# Patient Record
Sex: Male | Born: 1975
Health system: Southern US, Community
[De-identification: ages and names within clinical notes are randomized; demographics above are authoritative.]

## PROBLEM LIST (undated history)

## (undated) VITALS — BP 120/77 | HR 116 | Temp 97.5°F | Resp 17 | Ht 70.0 in | Wt 227.0 lb

## (undated) DIAGNOSIS — F319 Bipolar disorder, unspecified: Secondary | ICD-10-CM

## (undated) DIAGNOSIS — F209 Schizophrenia, unspecified: Secondary | ICD-10-CM

## (undated) DIAGNOSIS — R12 Heartburn: Secondary | ICD-10-CM

## (undated) DIAGNOSIS — R569 Unspecified convulsions: Secondary | ICD-10-CM

## (undated) DIAGNOSIS — Z9119 Patient's noncompliance with other medical treatment and regimen: Secondary | ICD-10-CM

## (undated) DIAGNOSIS — F1020 Alcohol dependence, uncomplicated: Secondary | ICD-10-CM

## (undated) DIAGNOSIS — F200 Paranoid schizophrenia: Secondary | ICD-10-CM

## (undated) DIAGNOSIS — F1910 Other psychoactive substance abuse, uncomplicated: Secondary | ICD-10-CM

## (undated) DIAGNOSIS — Z91199 Patient's noncompliance with other medical treatment and regimen due to unspecified reason: Secondary | ICD-10-CM

## (undated) HISTORY — PX: NO PAST SURGERIES: SHX2092

---

## 2000-10-03 ENCOUNTER — Inpatient Hospital Stay (HOSPITAL_COMMUNITY): Admission: EM | Admit: 2000-10-03 | Discharge: 2000-10-07 | Payer: Self-pay | Admitting: *Deleted

## 2006-09-10 ENCOUNTER — Ambulatory Visit: Payer: Self-pay | Admitting: *Deleted

## 2006-09-10 ENCOUNTER — Inpatient Hospital Stay (HOSPITAL_COMMUNITY): Admission: AD | Admit: 2006-09-10 | Discharge: 2006-09-16 | Payer: Self-pay | Admitting: *Deleted

## 2006-10-23 ENCOUNTER — Emergency Department (HOSPITAL_COMMUNITY): Admission: EM | Admit: 2006-10-23 | Discharge: 2006-10-24 | Payer: Self-pay | Admitting: Emergency Medicine

## 2007-05-07 ENCOUNTER — Emergency Department (HOSPITAL_COMMUNITY): Admission: EM | Admit: 2007-05-07 | Discharge: 2007-05-07 | Payer: Self-pay | Admitting: Emergency Medicine

## 2009-12-10 ENCOUNTER — Other Ambulatory Visit: Payer: Self-pay | Admitting: Emergency Medicine

## 2009-12-10 ENCOUNTER — Ambulatory Visit: Payer: Self-pay | Admitting: Psychiatry

## 2009-12-10 ENCOUNTER — Inpatient Hospital Stay (HOSPITAL_COMMUNITY): Admission: AD | Admit: 2009-12-10 | Discharge: 2009-12-19 | Payer: Self-pay | Admitting: Psychiatry

## 2010-10-15 ENCOUNTER — Emergency Department (HOSPITAL_BASED_OUTPATIENT_CLINIC_OR_DEPARTMENT_OTHER)
Admission: EM | Admit: 2010-10-15 | Discharge: 2010-10-16 | Payer: Self-pay | Source: Home / Self Care | Admitting: Emergency Medicine

## 2011-01-08 LAB — BASIC METABOLIC PANEL
BUN: 8 mg/dL (ref 6–23)
CO2: 21 mEq/L (ref 19–32)
Chloride: 107 mEq/L (ref 96–112)
Creatinine, Ser: 0.7 mg/dL (ref 0.4–1.5)
Glucose, Bld: 102 mg/dL — ABNORMAL HIGH (ref 70–99)
Potassium: 4.4 mEq/L (ref 3.5–5.1)

## 2011-01-08 LAB — ETHANOL: Alcohol, Ethyl (B): <5 mg/dL (ref 0–10)

## 2011-01-08 LAB — POCT TOXICOLOGY PANEL

## 2011-01-08 LAB — VALPROIC ACID LEVEL: Valproic Acid Lvl: 75.8 ug/mL (ref 50.0–100.0)

## 2011-01-17 LAB — URINALYSIS, ROUTINE W REFLEX MICROSCOPIC
Bilirubin Urine: NEGATIVE
Hgb urine dipstick: NEGATIVE
Ketones, ur: NEGATIVE mg/dL
Nitrite: NEGATIVE
Specific Gravity, Urine: 1.02 (ref 1.005–1.030)
Urobilinogen, UA: 0.2 mg/dL (ref 0.0–1.0)

## 2011-01-17 LAB — TSH: TSH: 2.422 u[IU]/mL (ref 0.350–4.500)

## 2011-01-17 LAB — HEPATIC FUNCTION PANEL
Albumin: 4.1 g/dL (ref 3.5–5.2)
Alkaline Phosphatase: 43 U/L (ref 39–117)
Bilirubin, Direct: 0.1 mg/dL (ref 0.0–0.3)
Total Bilirubin: 0.5 mg/dL (ref 0.3–1.2)

## 2011-01-17 LAB — RPR: RPR Ser Ql: NONREACTIVE

## 2011-01-18 LAB — DIFFERENTIAL
Basophils Absolute: 0.1 10*3/uL (ref 0.0–0.1)
Basophils Relative: 2 % — ABNORMAL HIGH (ref 0–1)
Eosinophils Absolute: 0 10*3/uL (ref 0.0–0.7)
Monocytes Absolute: 0.7 10*3/uL (ref 0.1–1.0)
Monocytes Relative: 11 % (ref 3–12)

## 2011-01-18 LAB — BASIC METABOLIC PANEL
CO2: 28 mEq/L (ref 19–32)
Chloride: 103 mEq/L (ref 96–112)
Creatinine, Ser: 0.7 mg/dL (ref 0.4–1.5)
GFR calc Af Amer: >60 mL/min (ref >60)
Glucose, Bld: 133 mg/dL — ABNORMAL HIGH (ref 70–99)
Sodium: 139 mEq/L (ref 135–145)

## 2011-01-18 LAB — CBC
Hemoglobin: 15 g/dL (ref 13.0–17.0)
MCHC: 34.8 g/dL (ref 30.0–36.0)
MCV: 96.2 fL (ref 78.0–100.0)
RBC: 4.49 MIL/uL (ref 4.22–5.81)
RDW: 12 % (ref 11.5–15.5)

## 2011-01-18 LAB — POCT TOXICOLOGY PANEL

## 2011-03-16 NOTE — Discharge Summary (Signed)
Albert [name], Albert [name]        ACCOUNT NO.:  [id number]   MEDICAL RECORD NO.:  [id number]          PATIENT TYPE:  IPS   LOCATION:  0403                          FACILITY:  [facility]   PHYSICIAN:  Jasmine Pang, M.D. DATE OF BIRTH:  Jun 21, 1976   DATE OF ADMISSION:  09/10/2006  DATE OF DISCHARGE:  09/16/2006                               DISCHARGE SUMMARY   IDENTIFYING INFORMATION:  The patient is a 35 year old Caucasian male  who was single who was admitted on a voluntary basis on September 10, 2006.   HISTORY OF PRESENT ILLNESS:  The patient was referred by the mental  health center after expressing paranoid thoughts that he thought people  were praying for him to die at AA.  He reports having a lot of excess  energy and needing to walk a lot.  He felt his thoughts were getting  scattered and his memory was deteriorating.  He was having a lot of  difficulty focusing, for example had lost his wallet that day.  The  patient sees Willette Alma at [facility].  This is  the second [facility] admission.  He was last here in 2001.  He has had at  least one prior suicide attempt in 2001 where he cut himself.  He has  had prior stays at [facility] and [facility].  He  reports some troubles with his sexual identity.  He is also diagnosed  with general anxiety disorder.  The patient has been on Prozac, Paxil,  Risperdal and Depakote.   MEDICATIONS:  None now.   PHYSICAL EXAMINATION:  Well-developed, well-nourished Caucasian male in  no acute distress.   LABORATORY DATA:  Initial sodium was 129 (135-145).  Rest of  comprehensive metabolic panel was within normal limits.  CBC was within  normal limits.  TSH was 1.418 which was within normal limits.   HOSPITAL COURSE:  The patient was started on Risperdal M-Tab 0.5 mg p.o.  q.h.s. and 0.5 mg p.o. q.h.s. p.r.n. psychotic agitation.  He was also  started on Ativan 2 mg p.o. or IM q.6h. p.r.n. anxiety or  agitation.  He  did not need this.  On September 11, 2006, the patient was started on  Depakote ER p.o. q.h.s.  On September 12, 2006, at the patient's request,  the Risperdal was discontinued.  He stated he could not afford both of  these and felt he could do well on the Depakote alone.  The patient  tolerated his medications well with no significant side effects.  An  a.m. Depakote level was ordered and was 35.1 (50-100).  This was soon  after starting his Depakote .   On September 11, 2006, after first meeting the patient, he talked about  being here because he was panicky and anxious.  He was rubbing his hands  together.  He was quiet and reserved.  He was reading the new info  packet.  He stated I would love to be at the beach somewhere.  He was  somewhat incongruent with the conversation but he also spontaneously  stated he wanted to be married with  no trigger from me.  On September 12, 2006, the patient slept well.  He continued to be somewhat odd in his  relating with other people.  He had poor eye contact.  He discussed his  grandparents.  He also stated he did not want to stay on the Risperdal  but only the Depakote so Risperdal was discontinued.  On September 13, 2006, the patient discussed his apartment.  His appetite was good.  He  had begun to sleep through the night.  He wanted to know when he could  go home.  He continued be vague, somewhat evasive and incongruent with  an incongruent affect as we talked.  On the weekend, September 14, 2006  and September 15, 2006, the patient stated he was feeling better.  He had  come in for some negative thoughts and said these were much improved.  His Depakote level had returned and was 35.1 on September 13, 2006.   On September 16, 2006, the patient's mental status had improved from  admission status.  His affect was still somewhat incongruent and bizarre  with poor eye contact but this had improved.  His speech was normal rate  and flow.   Psychomotor activity was within normal limits.  The patient's  mood was less anxious and depressed.  Affect still somewhat bland and  flat but improved from admission status.  There was no suicidal or  homicidal ideation.  No thoughts of self-injurious behavior.  No  auditory or visual hallucinations.  No delusions or paranoia.  Thoughts  were logical and goal-directed.  Thought content no predominant theme.  Cognitive was grossly within normal limits.  The patient was felt to be  safe and ready for discharge today.   DISCHARGE DIAGNOSES:  AXIS I:  Bipolar, type 1, hypomanic.  Alcohol  abuse in seven month remission.  AXIS II:  None.  AXIS III:  Hyponatremia.  AXIS IV:  Moderate (problems with primary support group, other  psychosocial problems).  AXIS V:  GAF at discharge 45; GAF at admission 25; GAF highest past year  36.   ACTIVITY/DIET:  There were no specific activity level or dietary  restrictions.   DISCHARGE MEDICATIONS:  1. Depakote ER 500 mg p.o. q.h.s.  2. Ambien 10 mg at bedtime if needed for sleep.   POST-HOSPITAL CARE PLANS:  The patient will be seen at the [facility] on Tuesday September 25, 2006 at 3 p.m.  He will be seen by Dr.  Lang Snow for reentry plans.      Jasmine Pang, M.D.  Electronically Signed     BHS/MEDQ  D:  09/16/2006  T:  09/16/2006  Job:  [id number]

## 2011-03-16 NOTE — H&P (Signed)
Albert [name], Albert [name]        ACCOUNT NO.:  [id number]   MEDICAL RECORD NO.:  [id number]          PATIENT TYPE:  IPS   LOCATION:  0403                          FACILITY:  [facility]   PHYSICIAN:  Jasmine Pang, M.D. DATE OF BIRTH:  08-06-1976   DATE OF ADMISSION:  09/10/2006  DATE OF DISCHARGE:                         PSYCHIATRIC ADMISSION ASSESSMENT   IDENTIFICATION:  A 35 year old single white male.  This is a voluntary  admission.   HISTORY OF PRESENT ILLNESS:  This patient reports he walked to mental health  yesterday.  He has been feeling for the past 2 weeks that he has a lot of  excessive energy needing to move all the time, sleep has been rather poor  and he reports that his memory is deteriorating.  He lost his wallet  yesterday, feels unable to concentrate, developing a lot of scattered  thoughts and then feeling vaguely upset with some suicidal thoughts without  a clear plan.  At mental health he expressed some ideas that the people at  [facility] that he attends were praying for him to die.  His  affect was a bit bizarre yesterday so he was referred for inpatient  admission.  He denies any a homicidal thoughts today or any active suicidal,  thoughts but continues to have a lot of racing thought, difficulty  concentrating and feels unable to relax.   PAST PSYCHIATRIC HISTORY:  This is the second admission to [name] with his last admission being in 2001 when he was  seen by Dr. Carlye Grippe, and at that time was diagnosed with generalized  anxiety disorder and depression.  He was treated with Paxil at that time and  had been followed for quite some time by Willette Alma at [facility] in [place], [place].  He was also, in his last stay  here in 2001, struggling with some sexual identity issues.  He has a history  of one prior suicide attempt in 2001 when he attempted to cut himself.  He  has a history of  prior stays at [facility] and [facility].  He also has a history of some alcohol abuse and reports  that he has been abstaining from alcohol since February 16, 2006, has not  relapse and attends AA to reinforce his sobriety.  Also a history of  marijuana use which he currently denies.   SOCIAL HISTORY:  This is a single white male that lives in his own  apartment, no children, was raised Catholic, not practicing his faith at  this time, expressing the desire to pursue service work.  He receives  financial support from his mother, who lives in [place], [place].  He is the middle of three children and has a younger sister and an older  sister.  No evidence of legal charges.   FAMILY HISTORY:  The patient's family history is noncontributory.   ALCOHOL AND DRUG HISTORY:  Noted above.  The patient is a nonsmoker.  No use  of tobacco.   MEDICAL HISTORY:  The patient claims he has no primary  care Zaccheus Edmister  currently.  In the past, he has been seen at [facility].   MEDICAL PROBLEMS:  He denies.   PAST MEDICAL HISTORY:  No seizures.  No surgeries.  No hospitalizations  other than the psychiatric hospitalizations noted above.  No fractures.  No  history of myocardial infarct, no acute asthma, no diabetes.   MEDICATIONS:  Currently none.  In the past he has taken Prozac, Wellbutrin,  Risperdal, Depakote, also had taken Xanax and Ativan in various amounts for  his generalized anxiety disorder.   DRUG ALLERGIES:  NO KNOWN DRUG ALLERGIES.   POSITIVE PHYSICAL FINDINGS:  The patient is a well-nourished, well-developed  male who is in no distress.  The physical exam is entirely unremarkable on  admission to the unit.  Height 5 feet 10 inches tall, 173 pounds, pulse 94,  respirations 20.  He was afebrile, blood pressure 162/88.   DIAGNOSTIC STUDIES:  CBC within normal limits with hemoglobin 14.8,  hematocrit 42.5, platelets 274,000.  Sodium is  decreased at 129, potassium  normal at 3.9.  Chloride 97, carbon dioxide 27, BUN 9 and creatinine 0.9,  random glucose 89.  Liver enzymes are within normal limits.  SGOT 30, SGPT  16, alkaline phosphatase 44 and total bilirubin 0.9 calcium normal.  TSH  1.418.  UA and UDS is currently pending.   MENTAL STATUS EXAM:  Fully alert male, appropriate, polite, pleasant,  anxious affect somewhat moist skin erect posture, clenching his hands,  clinching and unclinching them, looking somewhat anxious but very polite.  Speech is normal in tone circumferential in pattern.  No response lag.  Production is adequate.  Mood is anxious.  Insight is poor.  He is  attempting to concentrate and answer questions but is losing track of his  train of thought.  Speech contents is rather superficial talking about  wanting to be a volunteer, do the right thing, a lot of automatic type  responses, admits to being anxious, feeling bad because he cannot remember  things.  He expresses some fear over his own racing thoughts, is having some  flight of ideas with decreased concentration, vague positive suicidal  thoughts.  No homicidal thought.  No paranoia expressed today.  Clearly had  some of that at mental health yesterday.  Cognitively, he is intact and  oriented x3.   AXIS I:  1. Rule out bipolar I disorder, hypomanic  2. Alcohol abuse in 7 months remission  AXIS III:  Hyponatremia.  AXIS IV:  Deferred.  Having a supportive mother and family is an asset to  him.  AXIS V:  Current 25, past year 53.   PLAN:  Voluntarily admit the patient with every 15-minute checks in place.  He is in our intensive care unit at this time.  We have discussed  medications to help control his mood and he is agreeable to restarting  Depakote, which we will do today and we are giving him Risperdal 0.5 mg on  regular dosing at h.s.  We are restricting his fluids, especially his free water and encourage him to drink Gatorade and we  will recheck a sodium level  tomorrow.  He is agreeable to having his mother involved in his care here  and will see about getting a family session.   ESTIMATED LENGTH OF STAY:  5-7 days.      Margaret A. Lorin Picket, N.P.      Jasmine Pang, M.D.  Electronically Signed    MAS/MEDQ  D:  09/11/2006  T:  09/11/2006  Job:  [id number]

## 2011-03-16 NOTE — H&P (Signed)
[facility]  Patient:    Albert [name], Albert [name]                       MRN: [id number] Adm. Date:  [id number] Attending:  Otilio Saber Dictator:   Johnella Moloney, N.P.                   Psychiatric Admission Assessment  IDENTIFYING INFORMATION:  Albert [name] is a 35 year old white single male admitted on an involuntary basis October 03, 2000.  CHIEF COMPLAINT:  "A little suicidal, anxiety attack, fear, and I just started cutting myself with a knife, depression."  HISTORY OF PRESENT ILLNESS:  The patient reports for the last week he has not worked and he has had multiple anxiety attacks.  He describes the anxiety attacks as "I just want to crawl out of skin and I have a sick feeling."  He has had some difficulties at work.  He feels like management and some people at work have been creating some problems for him.  He states, "I blew up and stood up for myself."  He also said apparently some coworkers are spreading rumors and gossip about him which makes it very difficult for him.  He also reports that he is beginning to isolate himself from everyone and when he starts doing this, this usually precedes the depression which has lead to hospitalizations and suicide attempts in the past.  He states he feels like he is anxious and the depression comes after the feelings of anxiety have intensified.  Lots of negative feelings about himself, low self-esteem. Apparently had a friend visit him yesterday who encouraged him to talk to Dr. Betti Cruz about getting treatment and possible hospitalization.  He started having suicidal thoughts on Tuesday although there was no plan.  He did, however, cut himself on his wrists superficially this past Tuesday and a friend stopped him. He states his mind is racing.  Sleep has been poor, averaging two to four hours for three months.  He states he feels like a lot of his sleep problems are attributed to his work schedule and moving from  shift to shift that interferes with his sleep pattern.  Appetite has been poor for about two months.  He has lost 10 pounds in one week.  Again, stress at work, low self-esteem.  He has really no support system.  He has been struggling with a sexual identity issue which he finally thinks he has accepted the fact regarding his sexual identity; however, he has not told anybody including family.  PAST PSYCHIATRIC HISTORY:  The patient has had one previous suicide attempt; in September 2000 he cut himself.  He has been seeing Dr. Betti Cruz for two months for depression and anxiety.  He saw Albert [name] at the [facility] for two years off and on.  He has been hospitalized x 2, June 2000 at [facility] when he was suicidal; September 2000 at [facility] for seven to eight days when he was suicidal.  PAST MEDICAL HISTORY:  The patient primary care doctor is Dr. Chauncy Lean at the [facility] in [facility].  He last saw him last week.  Medical problems: Allergies.  Current medications: Paxil 40 mg q.a.m., he has been on this dose for approximately one month; Xanax 1 mg b.i.d. p.r.n. p.o., he usually takes a Xanax at 1 to 2 oclock in the afternoon and sometimes one about 7 to 8  p.m. in the evening; Claritin 10 mg q.d.; eyedrops, i.e. normal saline solution.  Drug allergies: No known drug allergies.  SOCIAL HISTORY:  The patient is single.  He lives in [place] alone in an apartment.  He stays with a friend here in [place] about 50% of the time. His parents are living.  His mother lives in [place].  His father lives in [place] and he has not seen him in two years since he lives so far away.  He does have a twin sister with whom he has a close relationship.  He also has another younger sister.  Completed high school.  He works at [employer] in [place] and has been there for nine months.  He states he really would like to go back to  college and, again, there are some work-related problems.  Some financial problems.  His credit cards are maxxed out.  He does acknowledge he is involved in a significant relationship.  Again, conflicts about sexual identity and he was raised Catholic which also creates a lot of guilt for him. Family history: Mother suffers from depression, a lot of his aunts suffer from depression.  ALCOHOL AND DRUG HISTORY:  The patient drinks occasionally.  He states he might drink a six pack perhaps five times in the last two months.  It sounds like he actually drinks to treat his anxiety.  He states he drank a six pack this week and he felt like that might have triggered something in terms of his current problems.  He has a history of marijuana abuse.  He last used two years ago.  He is a nonsmoker.  PHYSICAL EXAMINATION:  Pending.  Temperature 97.4, respirations 18, pulse 62, blood pressure 112/70, weight 133 pounds, height 5 feet 11 inches.  CURRENT MENTAL STATUS EXAMINATION:  A young, casually dressed Caucasian male who is cooperative, good eye contact.  He is fidgeting a lot, rubbing his face constantly.  Speech: Normal tone and relevant.  Mood: Anxious, sad.  Affect: Labile.  Currently no suicidal but he was having suicidal thoughts yesterday and he did cut his wrists superficially this past Tuesday.  Thought processes are logical and coherent without evidence of psychosis.  No hallucinations, no delusions.  He does have a tendency to ramble; however, he can be easily redirected.  Cognitive: Alert and oriented, cognitive functioning is intact. Insight: Fair.  Impulse control: Fair.  Judgment: Poor.  CURRENT DIAGNOSES: Axis I:    1. Generalized anxiety disorder.            2. Major depression, recurrent. Axis II:   Deferred. Axis III:  Allergies. Axis IV:   Severe related to financial problems, sexual identity problems, and            work problems. Axis V:    Current global assessment of  functioning 37, highest in the past            year 65.  TREATMENT PLAN AND RECOMMENDATIONS:  Voluntary admission to [facility] Unit.  Maintain safety, check every 15 minutes.  The patient is able to contract for safety.  Will continue Paxil 40 mg p.o. q.d.  He has only been at the 40 mg level for one month; Xanax 1 mg t.i.d. p.o. p.r.n.; Claritin 10 mg p.o. q.d. for allergies; All Clear eye drops one to two drops q.i.d. p.r.n.  Have the patient attend groups.  Our goal will be to maintain his safety, medication adjustment, and probably  when he is released, refer him back to Dr. Betti Cruz, would recommend individual therapy for him and also a good support group for him.  TENTATIVE LENGTH OF STAY:  Three days. DD:  10/04/00 TD:  10/04/00 Job: [id number]

## 2011-03-16 NOTE — Discharge Summary (Signed)
[facility]  Patient:    Albert [name], Albert [name]                       MRN: [id number] Adm. Date:  [id number] Disc. Date: [id number] Attending:  Otilio Saber                           Discharge Summary  BRIEF HISTORY OF PRESENT ILLNESS:  Mr. Fillinger is a 35 year old single white male admitted under commitment with a history of depression and suicidal ideation.  The patient reported not working for the week prior to admission and having multiple anxiety attacks.  He had been irritable at work and felt that people were spreading rumors and gossip about him.  He became increasingly isolated and stated this had usually preceded his depression and previous suicide attempts.  He felt increasingly anxious and had suicidal thoughts and began cutting on his wrists superficially.  He reported some racing thoughts, decreased sleep, and decreased appetite with a 10 pound weight loss.  PAST PSYCHIATRIC HISTORY:  The patient had one previous suicide attempt in September 2000 when he cut himself.  He had been seeing Dr. Betti Cruz for two months for depression and anxiety.  He had seen Dr. Phillips Hay at the [facility] off and on over the past two years.  He was hospitalized in June 2000 at [facility] and September 2000 at [facility].  PAST MEDICAL HISTORY:  He was followed medically by Dr. Harriet Pho at the [facility] in [place].  The patient had no significant medical problems.  At the time of admission, he was on Paxil 40 mg q.a.m. and Xanax 1 mg b.i.d. p.r.n., Claritin 10 mg q.d.  He had no known drug allergies.  PHYSICAL EXAMINATION:  Normal.  MENTAL STATUS EXAMINATION ON ADMISSION:  Casually dressed white male.  He was fidgeting a lot, rubbing his face constantly.  Speech was normal.  Thought processes were logical and coherent without evidence of psychosis.  He was denying further suicidal ideation.  Mood  was depressed anxious.  Affect was rather labile.  Oriented x 3.  Cognitive functioning was intact.  ADMITTING DIAGNOSES: Axis I:    1. Generalized anxiety disorder.            2. Major depression, recurrent. Axis II:   Deferred. Axis III:  Seasonal allergies. Axis IV:   Psychosocial stressors are severe. Axis V:    Global assessment of functioning current is 47, highest in the past            year was 65.  LABORATORY DATA ON ADMISSION:  CBC was normal.  Blood chemistries were normal. Thyroid panel was normal.  Urine drug screen was positive only for benzodiazepines.  Urinalysis was normal.  HOSPITAL COURSE:  The patient was admitted to the [facility] for treatment of his depression and to prevent any suicidal acting out.  He was treated with a combination of Paxil and Xanax.  The patient was able to talk in individual and group therapies on coping with his issues and his interaction with others.  He gradually became less depressed and anxious.  He was placed on Remeron which worked well.  He was sleeping and eating well and tolerating his medications well.  He was having no further suicidal thoughts and it was felt he could be managed on an outpatient basis.  CONDITION  ON DISCHARGE:  The patient was improved condition with improvement in his mood, sleep and appetite, alleviation of his suicidal ideation.  DISPOSITION:  The patient was discharged home.  FOLLOWUP:  Dr. Betti Cruz on October 15, 2000.  DISCHARGE MEDICATIONS: 1. Paxil 40 mg q.a.m. and 20 mg q.h.s. 2. Remeron 15 mg q.h.s. 3. Xanax 1 mg t.i.d. p.r.n. 4. Ambien 5 to 10 mg q.h.s. p.r.n. 5. Claritin 10 mg p.o. q.d.  FINAL DIAGNOSES: Axis I:    1. Major depression, recurrent, severe.            2. Generalized anxiety disorder. Axis II:   No diagnosis. Axis III:  No diagnosis. Axis IV:   Psychosocial stressors are severe. Axis V:    Global assessment of functioning current is 52, highest in the past             year was 65. DD:  11/14/00 TD:  11/14/00 Job: [id number]

## 2012-01-14 ENCOUNTER — Encounter (HOSPITAL_BASED_OUTPATIENT_CLINIC_OR_DEPARTMENT_OTHER): Payer: Self-pay | Admitting: *Deleted

## 2012-01-14 ENCOUNTER — Emergency Department (HOSPITAL_COMMUNITY)
Admission: EM | Admit: 2012-01-14 | Discharge: 2012-01-18 | Disposition: A | Discharge status: Other Acute Inpatient Hospital | Payer: Medicare Other | Source: Home / Self Care | Attending: Emergency Medicine | Admitting: Emergency Medicine

## 2012-01-14 DIAGNOSIS — G4700 Insomnia, unspecified: Secondary | ICD-10-CM | POA: Insufficient documentation

## 2012-01-14 DIAGNOSIS — F200 Paranoid schizophrenia: Secondary | ICD-10-CM | POA: Insufficient documentation

## 2012-01-14 DIAGNOSIS — R45851 Suicidal ideations: Secondary | ICD-10-CM | POA: Insufficient documentation

## 2012-01-14 DIAGNOSIS — IMO0002 Reserved for concepts with insufficient information to code with codable children: Secondary | ICD-10-CM | POA: Insufficient documentation

## 2012-01-14 DIAGNOSIS — F1021 Alcohol dependence, in remission: Secondary | ICD-10-CM | POA: Insufficient documentation

## 2012-01-14 DIAGNOSIS — F29 Unspecified psychosis not due to a substance or known physiological condition: Secondary | ICD-10-CM

## 2012-01-14 HISTORY — DX: Alcohol dependence, uncomplicated: F10.20

## 2012-01-14 HISTORY — DX: Paranoid schizophrenia: F20.0

## 2012-01-14 HISTORY — DX: Other psychoactive substance abuse, uncomplicated: F19.10

## 2012-01-14 LAB — COMPREHENSIVE METABOLIC PANEL
ALT: 20 U/L (ref 0–53)
BUN: 4 mg/dL — ABNORMAL LOW (ref 6–23)
CO2: 23 mEq/L (ref 19–32)
Calcium: 9.7 mg/dL (ref 8.4–10.5)
GFR calc Af Amer: >90 mL/min (ref >90)
GFR calc non Af Amer: >90 mL/min (ref >90)
Glucose, Bld: 135 mg/dL — ABNORMAL HIGH (ref 70–99)
Sodium: 134 mEq/L — ABNORMAL LOW (ref 135–145)

## 2012-01-14 LAB — CBC
HCT: 43.3 % (ref 39.0–52.0)
Hemoglobin: 15.4 g/dL (ref 13.0–17.0)
MCH: 32.4 pg (ref 26.0–34.0)
MCV: 91.2 fL (ref 78.0–100.0)
RBC: 4.75 MIL/uL (ref 4.22–5.81)

## 2012-01-14 LAB — RAPID URINE DRUG SCREEN, HOSP PERFORMED
Cocaine: NOT DETECTED
Opiates: NOT DETECTED

## 2012-01-14 MED ORDER — ZIPRASIDONE MESYLATE 20 MG IM SOLR
INTRAMUSCULAR | Status: AC
  Administered 2012-01-14: 20 mg via INTRAMUSCULAR
  Filled 2012-01-14: qty 20

## 2012-01-14 MED ORDER — LORAZEPAM 1 MG PO TABS
1.0000 mg | ORAL_TABLET | Freq: Once | ORAL | Status: AC
  Administered 2012-01-14: 1 mg via ORAL
  Filled 2012-01-14: qty 1

## 2012-01-14 NOTE — ED Notes (Signed)
Hearing voices telling him stoke. Fasting and not sleeping x 3 days. Hyper excitable at triage.

## 2012-01-14 NOTE — ED Notes (Signed)
Pt. Started to become irrational and disruptive at the door of room 11.  Pt. Was rude and billergent to [employer].  Pt. Then became rude and disruptive to [employer].  [employer] was called to Room 9 and then [employer] was called.  [employer] came and saw the Pt. Pt. Is now calm and eating [employer] with his step father.  Pt. Will be involuntarily committed.

## 2012-01-14 NOTE — ED Notes (Signed)
Mobile Crisis person in to evaluate pt now.

## 2012-01-14 NOTE — ED Notes (Signed)
States clifford the big red dog has flashing lights on his (points to his penis) and he does not know if he should act on it or not. pts father is sitting with him at triage. Urine specimen obtained and sent to the lab.

## 2012-01-14 NOTE — ED Notes (Signed)
Family at bedside. 

## 2012-01-14 NOTE — ED Notes (Signed)
Pt. Reports not having any thoughts of harming self or others.  Pt. Reports he takes meds on a regular basis.  Pt. Reports tonight is the only time he has missed his meds.

## 2012-01-14 NOTE — ED Notes (Signed)
Mobile Crisis team member left to see magistrate and to get involuntary commitment papers started.  After the papers are served on the Pt.  [facility] RN will need to call mobile Crisis again for some one to get things started for a bed for an involuntary commitment.

## 2012-01-14 NOTE — ED Provider Notes (Addendum)
History     CSN: [id number]  Arrival date & time 01/14/12  7829   First MD Initiated Contact with Patient 01/14/12 2023      Chief Complaint  Patient presents with  . Medical Clearance    (Consider location/radiation/quality/duration/timing/severity/associated sxs/prior treatment) Patient is a 36 y.o. male presenting with mental health disorder. The history is provided by the patient and a parent. No language interpreter was used.  Mental Health Problem The primary symptoms include dysphoric mood, hallucinations and bizarre behavior. The current episode started today. This is a new problem.  The degree of incapacity that he is experiencing as a consequence of his illness is severe. Sequelae of the illness include an inability to work and harmed interpersonal relations. Additional symptoms of the illness include insomnia, flight of ideas and decreased need for sleep. He admits to suicidal ideas. He does not have a plan to commit suicide. He contemplates harming himself. He has already injured self. He does not contemplate injuring another person. He has not already  injured another person. Risk factors that are present for mental illness include a history of mental illness.  Pt has a history of Schizophrenia.  Pt's Father reports pt may not be taking medications.  Pt has been hearing voices today  Past Medical History  Diagnosis Date  . Paranoid schizophrenia   . Drug abuse     xanax addiction 5 years ago  . Alcoholism     History reviewed. No pertinent past surgical history.  No family history on file.  History  Substance Use Topics  . Smoking status: Never Smoker   . Smokeless tobacco: Not on file  . Alcohol Use: No      Review of Systems  Psychiatric/Behavioral: Positive for hallucinations, behavioral problems and dysphoric mood. The patient has insomnia.   All other systems reviewed and are negative.    Allergies  Review of patient's allergies indicates no known  allergies.  Home Medications   Current Outpatient Rx  Name Route Sig Dispense Refill  . SEROQUEL XR PO Oral Take 1 tablet by mouth daily.    Marland Kitchen ZOLOFT PO Oral Take 1 tablet by mouth daily.      BP 161/106  Pulse 125  Temp(Src) 97.5 F (36.4 C) (Oral)  Resp 24  Wt 200 lb (90.719 kg)  SpO2 100%  Physical Exam  Vitals reviewed. Constitutional: He is oriented to person, place, and time. He appears well-developed and well-nourished.  HENT:  Head: Normocephalic.  Right Ear: External ear normal.  Eyes: Conjunctivae are normal. Pupils are equal, round, and reactive to light.  Neck: Normal range of motion. Neck supple.  Cardiovascular: Normal rate and normal heart sounds.   Pulmonary/Chest: Effort normal.  Abdominal: Soft.  Musculoskeletal: Normal range of motion.  Neurological: He is alert and oriented to person, place, and time. He has normal reflexes.  Skin: Skin is warm.  Psychiatric: He has a normal mood and affect.    ED Course  Procedures (including critical care time)  Labs Reviewed  COMPREHENSIVE METABOLIC PANEL - Abnormal; Notable for the following:    Sodium 134 (*)    Glucose, Bld 135 (*)    BUN 4 (*)    All other components within normal limits  URINE RAPID DRUG SCREEN (HOSP PERFORMED)  ETHANOL  CBC   No results found.   No diagnosis found.    MDM  Pt given ativan po.   Pt given geodon Im for agitation.   Mobile crisis here  to see and talk with pt,  Involuntary commitment papers done by Dr. Karma Ganja.        Lonia Skinner [place], [place] 01/14/12 2303  Lonia Skinner [place], [place] 01/15/12 2239

## 2012-01-14 NOTE — ED Notes (Signed)
Pt. Reports he has been fasting and not sleeping.  Pt. Reports for 3 days he did not eat anything till today he had a pice of cheese pizza.  Pt. Is nervous and asking questions each time I look at him.

## 2012-01-14 NOTE — ED Provider Notes (Signed)
Medical screening examination/treatment/procedure(s) were conducted as a shared visit with non-physician practitioner(s) and myself.  I personally evaluated the patient during the encounter  Pt seen and examined- pt initially cooperative, then became more belligerent and requesting to leave- pt's IVC paperwork signed by me, Geodon IM ordered.   Ethelda Chick, MD 01/14/12 [phone]

## 2012-01-15 MED ORDER — DIVALPROEX SODIUM 125 MG PO CPSP
500.0000 mg | ORAL_CAPSULE | Freq: Three times a day (TID) | ORAL | Status: DC
  Administered 2012-01-15 – 2012-01-18 (×9): 500 mg via ORAL
  Filled 2012-01-15 (×14): qty 4

## 2012-01-15 MED ORDER — SERTRALINE HCL 50 MG PO TABS
100.0000 mg | ORAL_TABLET | Freq: Every day | ORAL | Status: DC
  Administered 2012-01-15 – 2012-01-17 (×3): 100 mg via ORAL
  Filled 2012-01-15 (×2): qty 2
  Filled 2012-01-15 (×2): qty 1

## 2012-01-15 MED ORDER — ACETAMINOPHEN 325 MG PO TABS: 650.0000 mg | ORAL_TABLET | ORAL | Status: DC | PRN

## 2012-01-15 MED ORDER — LORAZEPAM 2 MG/ML IJ SOLN
INTRAMUSCULAR | Status: AC
  Filled 2012-01-15: qty 1

## 2012-01-15 MED ORDER — LORAZEPAM 2 MG/ML IJ SOLN
1.0000 mg | Freq: Once | INTRAMUSCULAR | Status: AC
  Administered 2012-01-15: 2 mg via INTRAMUSCULAR

## 2012-01-15 MED ORDER — ZIPRASIDONE MESYLATE 20 MG IM SOLR: 20.0000 mg | Freq: Once | INTRAMUSCULAR | Status: DC

## 2012-01-15 MED ORDER — NICOTINE 21 MG/24HR TD PT24: 21.0000 mg | MEDICATED_PATCH | Freq: Every day | TRANSDERMAL | Status: DC

## 2012-01-15 MED ORDER — IBUPROFEN 200 MG PO TABS: 600.0000 mg | ORAL_TABLET | Freq: Three times a day (TID) | ORAL | Status: DC | PRN

## 2012-01-15 MED ORDER — ALUM & MAG HYDROXIDE-SIMETH 200-200-20 MG/5ML PO SUSP: 30.0000 mL | ORAL | Status: DC | PRN

## 2012-01-15 MED ORDER — LORAZEPAM 1 MG PO TABS: 1.0000 mg | ORAL_TABLET | Freq: Once | ORAL | Status: DC

## 2012-01-15 MED ORDER — QUETIAPINE FUMARATE ER 400 MG PO TB24
800.0000 mg | ORAL_TABLET | Freq: Every day | ORAL | Status: DC
  Administered 2012-01-15 – 2012-01-17 (×3): 800 mg via ORAL
  Filled 2012-01-15 (×5): qty 2

## 2012-01-15 MED ORDER — ONDANSETRON HCL 4 MG PO TABS: 4.0000 mg | ORAL_TABLET | Freq: Three times a day (TID) | ORAL | Status: DC | PRN

## 2012-01-15 MED ORDER — ZOLPIDEM TARTRATE 10 MG PO TABS
10.0000 mg | ORAL_TABLET | Freq: Every evening | ORAL | Status: DC | PRN
  Administered 2012-01-15: 10 mg via ORAL
  Filled 2012-01-15 (×2): qty 1

## 2012-01-15 MED ORDER — LORAZEPAM 1 MG PO TABS: 1.0000 mg | ORAL_TABLET | Freq: Three times a day (TID) | ORAL | Status: DC | PRN

## 2012-01-15 NOTE — ED Notes (Signed)
Pt appears less anxious.   Sitter remains at bedside. PO fluids provided.

## 2012-01-15 NOTE — ED Notes (Signed)
[name], EMT at bedside talking with pt.

## 2012-01-15 NOTE — ED Notes (Signed)
[facility] states he will be in ED in an hour for evaluation.

## 2012-01-15 NOTE — ED Notes (Signed)
Pt is standing outside ED room and asking for restroom.  Pt appears anxious and wringing hands. He was informed of plan of care and awaiting bed placement.  He states he feels anxious however declines medications for anxiety.  MD informed and new orders received.

## 2012-01-15 NOTE — ED Notes (Signed)
Pt awaiting admit bed. Pt has calm and cooperative since receiving geodon. Pt has been up to bathroom without difficulty.

## 2012-01-15 NOTE — ED Notes (Signed)
Report received from Kellie Neal, RN, care assumed. 

## 2012-01-15 NOTE — ED Notes (Signed)
Spoke with Nanci Pina RN charge nurse at [place] [facility] who states this patient can go to [facility] psych ED bed. Report called to [employer] in Psych Ed at [place] [facility] . Ola Spurr RN contacted [facility] who states will send officers to transport pt

## 2012-01-15 NOTE — ED Notes (Signed)
Spoke with [name] ([employer]) related to ETA of officer to transport and he states second shift to transport.

## 2012-01-15 NOTE — ED Notes (Signed)
[facility] has been notified that Pt. Has commitment papers now.  Pt. Is resting with eyes closed on stretcher.  Pt. Step father has gone home and left a phone number Step Fathers Name is Hal Sveum #[phone]

## 2012-01-15 NOTE — ED Provider Notes (Signed)
Medical screening examination/treatment/procedure(s) were conducted as a shared visit with non-physician practitioner(s) and myself.  I personally evaluated the patient during the encounter  Pt with psychosis, paranoia.  Became aggressive and beligerent, I have signed IVC paperwork on him, he was also given geodon.  [facility] team has been here to assess patient and is working on placement  Ethelda Chick, MD 01/15/12 2313

## 2012-01-15 NOTE — ED Notes (Signed)
Mobile crisis person has now returned attempt bed placement.

## 2012-01-15 NOTE — ED Notes (Signed)
[facility] attempting to find placement.  No bed acceptance at this point.  States he will notify as soon as pt is accepted.  No change in pt condition.  Sitter remains at bedside.

## 2012-01-15 NOTE — ED Notes (Signed)
Chart complete and pt ready for transfer.

## 2012-01-15 NOTE — ED Notes (Signed)
Pt. Woke with reports he was cold.  RN placed several warm blankets on Pt. And he laid back down on the stretcher to sleep.

## 2012-01-15 NOTE — ED Provider Notes (Signed)
Patient transferred to our ER to the psych ED from [place]. Patient was seen there yesterday. Patient has a history of schizophrenia. When I talked to the patient he seems to stare at me as if he is listening to other voices and then he will react. At one point I asked him to question and he said "my sensor isn't working I am sorry I didn't understand you" as he held his right hand to his abdomen. He denies having any physical complaints such as cough, sore throat, nausea vomiting diarrhea.  Social history patient denies smoking, alcohol use, he states he lives alone.  Medications Seroquel 800 mg a day, Zoloft 100 mg a day, and Depakote 1500 mg and I cannot tell if it's 500 mg3 times a day or 1500 mg at one time.   Patient is alert and cooperative. He stares for prolonged periods of time before he answers questions and then sometimes doesn't answer appropriately. His head is atraumatic, pupils are equal reactive to light extraocular muscles are intact. His pupils are mildly dilated. Nose is atraumatic. Oropharynx reveals intact teeth mucous membranes are moist. Neck is supple he moves his head freely during conversation. Lungs are clear throughout wheezes rhonchi or rales, he is in no respiratory distress. Cardiovascular S1-S2 is normal there is no murmurs gallops or rubs. Extremities patient has no deformity he moves all terminates well. Neuro patient is awake and alert however he is not oriented. Psychiatric patient appears to be listening to voices, he also is displaying some delusions.  I have been told by nursing staff that the IVC papers done at [facility] whenever notarized. So we will need to redo his IVC papers. He will be waiting placement.  IVC papers signed and notarized.   2100 I was called because patient has never had any psych holding orders done, I have done them.   Devoria Albe, MD, Armando Gang   Ward Givens, MD 01/15/12 2135

## 2012-01-15 NOTE — ED Notes (Signed)
Pt medicated and cooperative at present time.  [facility] [employer] called for transport to [facility].

## 2012-01-15 NOTE — ED Notes (Signed)
[facility] called for update.

## 2012-01-15 NOTE — ED Notes (Signed)
ZOX:[id number]<VW> Expected date:01/15/12<BR> Expected time: 1:13 PM<BR> Means of arrival:Other<BR> Comments:<BR> IVC pt from [facility]- [name]

## 2012-01-15 NOTE — ED Provider Notes (Signed)
Pt still has not been placed.  Has been having some increasingly agitated behavior.  Will transfer to [facility] psych ED.  Dr. Bruce Donath has accepted pt  Rolan Bucco, MD 01/15/12 1321

## 2012-01-15 NOTE — ED Notes (Signed)
Per acute ed area RN, pt. Presented to [facility] with blue scrubs on from [facility]. H/P and [facility] had his shoes and pt. Was wearing underwear.  Pt. Did not present with belongings bag.

## 2012-01-15 NOTE — ED Notes (Signed)
[facility] in ED reviewing chart.

## 2012-01-15 NOTE — ED Notes (Signed)
Spoke with Tresa Endo from [employer] and informed that [facility] will be called. Sitter remains at bedside.  Pt sleeping and lying on right lateral side.

## 2012-01-15 NOTE — ED Notes (Signed)
[facility] at bedside to transport.

## 2012-01-16 NOTE — ED Notes (Signed)
Pt to window stating he feels drugged and what was it that he was given, pt advised he was given the same exact medications he takes at home and that he was educated about them before each one was administered, pt continues to state he feels drugged and walks back to room.

## 2012-01-16 NOTE — ED Notes (Signed)
Pt given new scrubs per request.

## 2012-01-17 NOTE — ED Provider Notes (Signed)
Sleeping this AM and in no distress.  No events overnight by report.  Pt is pending [facility] having been turned down elsewhere.  [facility], showing symptoms of psychosis, mild SI.  Continues to be followed by [facility] and can consider another telepsych eval if not done so recently.  Will get agitated by old notes.  medically cleared.   Gavin Pound. Oletta Lamas, MD 01/22/12 [phone]

## 2012-01-17 NOTE — [facility] Counselor (Signed)
[facility] worker here to work on patients disposition.

## 2012-01-17 NOTE — [facility] Counselor (Signed)
Per shift report, patient is on the wait list for [facility].  [facility] staff called and asked if patient was still here in need of placement. Writer responded "yes" to both questions. Jamine (staff at [employer]) sts that he will be here to work on patients placement in 1.5 hrs. He called at approx. 8:53am.   Pt to remain in the ED awaiting placement at [facility] or another facility.

## 2012-01-17 NOTE — ED Notes (Signed)
Crackers and pb/coke provided per pt request.

## 2012-01-18 ENCOUNTER — Inpatient Hospital Stay (HOSPITAL_COMMUNITY)
Admission: AD | Admit: 2012-01-18 | Discharge: 2012-01-28 | DRG: 885 | Disposition: A | Discharge status: Home / Self Care | Payer: Medicare Other | Source: Ambulatory Visit | Attending: Psychiatry | Admitting: Psychiatry

## 2012-01-18 ENCOUNTER — Encounter (HOSPITAL_COMMUNITY): Payer: Self-pay | Admitting: *Deleted

## 2012-01-18 DIAGNOSIS — Z91199 Patient's noncompliance with other medical treatment and regimen due to unspecified reason: Secondary | ICD-10-CM

## 2012-01-18 DIAGNOSIS — G40909 Epilepsy, unspecified, not intractable, without status epilepticus: Secondary | ICD-10-CM

## 2012-01-18 DIAGNOSIS — Z9119 Patient's noncompliance with other medical treatment and regimen: Secondary | ICD-10-CM

## 2012-01-18 DIAGNOSIS — F200 Paranoid schizophrenia: Secondary | ICD-10-CM

## 2012-01-18 DIAGNOSIS — F1311 Sedative, hypnotic or anxiolytic abuse, in remission: Secondary | ICD-10-CM

## 2012-01-18 DIAGNOSIS — F259 Schizoaffective disorder, unspecified: Principal | ICD-10-CM

## 2012-01-18 DIAGNOSIS — F250 Schizoaffective disorder, bipolar type: Secondary | ICD-10-CM

## 2012-01-18 DIAGNOSIS — Z79899 Other long term (current) drug therapy: Secondary | ICD-10-CM

## 2012-01-18 DIAGNOSIS — F319 Bipolar disorder, unspecified: Secondary | ICD-10-CM

## 2012-01-18 HISTORY — DX: Unspecified convulsions: R56.9

## 2012-01-18 MED ORDER — LORAZEPAM 1 MG PO TABS: 1.0000 mg | ORAL_TABLET | Freq: Three times a day (TID) | ORAL | Status: DC | PRN

## 2012-01-18 MED ORDER — NICOTINE 21 MG/24HR TD PT24
21.0000 mg | MEDICATED_PATCH | Freq: Every day | TRANSDERMAL | Status: DC
  Filled 2012-01-18 (×2): qty 1

## 2012-01-18 MED ORDER — HYDROXYZINE HCL 25 MG PO TABS
25.0000 mg | ORAL_TABLET | Freq: Three times a day (TID) | ORAL | Status: DC | PRN
  Filled 2012-01-18: qty 1

## 2012-01-18 MED ORDER — DIVALPROEX SODIUM 125 MG PO CPSP
500.0000 mg | ORAL_CAPSULE | Freq: Three times a day (TID) | ORAL | Status: DC
  Administered 2012-01-18 – 2012-01-19 (×3): 500 mg via ORAL
  Filled 2012-01-18 (×11): qty 4

## 2012-01-18 MED ORDER — IBUPROFEN 600 MG PO TABS: 600.0000 mg | ORAL_TABLET | Freq: Three times a day (TID) | ORAL | Status: DC | PRN

## 2012-01-18 MED ORDER — MAGNESIUM HYDROXIDE 400 MG/5ML PO SUSP
30.0000 mL | Freq: Every day | ORAL | Status: DC | PRN
  Filled 2012-01-18: qty 30

## 2012-01-18 MED ORDER — MAGNESIUM HYDROXIDE 400 MG/5ML PO SUSP: 30.0000 mL | Freq: Every day | ORAL | Status: DC | PRN

## 2012-01-18 MED ORDER — ACETAMINOPHEN 325 MG PO TABS
650.0000 mg | ORAL_TABLET | Freq: Four times a day (QID) | ORAL | Status: DC | PRN
  Filled 2012-01-18: qty 2

## 2012-01-18 MED ORDER — ALUM & MAG HYDROXIDE-SIMETH 200-200-20 MG/5ML PO SUSP: 30.0000 mL | ORAL | Status: DC | PRN

## 2012-01-18 MED ORDER — ONDANSETRON HCL 4 MG PO TABS: 4.0000 mg | ORAL_TABLET | Freq: Three times a day (TID) | ORAL | Status: DC | PRN

## 2012-01-18 MED ORDER — ALUM & MAG HYDROXIDE-SIMETH 200-200-20 MG/5ML PO SUSP
30.0000 mL | ORAL | Status: DC | PRN
  Filled 2012-01-18: qty 30

## 2012-01-18 MED ORDER — SERTRALINE HCL 100 MG PO TABS
100.0000 mg | ORAL_TABLET | Freq: Every day | ORAL | Status: DC
  Administered 2012-01-18 – 2012-01-20 (×3): 100 mg via ORAL
  Filled 2012-01-18 (×5): qty 1

## 2012-01-18 MED ORDER — QUETIAPINE FUMARATE ER 400 MG PO TB24
800.0000 mg | ORAL_TABLET | Freq: Every day | ORAL | Status: DC
  Administered 2012-01-18: 800 mg via ORAL
  Filled 2012-01-18 (×4): qty 2

## 2012-01-18 NOTE — Progress Notes (Signed)
Patient ID: Albert [name], male   DOB: Aug 12, 1976, 36 y.o.   MRN: [id number] Pt admitted on involuntary basis, on admission pt was cooperative with process, however pt did stare at staff and was somewhat guarded and hesitant to answer questions. Pt did not provide much background information, pt adamantly denied any suicidal thoughts on admission and able to contract for safety, pt also denied any auditory/visual hallucinations, pt unable to remember if he had received his medications today in the emergency department, pt did state that he has been taking his medication as he is suppose to, pt also denied any physical pain on admission, pt was oriented to unit and safety maintained

## 2012-01-18 NOTE — BH Assessment (Signed)
Assessment Note   Albert [name] is an 36 y.o. male. Initially presented to [facility] on 01/14/12 for psychotic symptoms and mobile crisis called in for assessment and placement. At that time pt was actively psychotic w/ poor judgement, insight & impulse control. Pt was referred to [facility] for placement. Pt assessed this AM by TA. Information transcribed from the report from April Sherrill with TA. Pt alert & oriented 4x. Pt denies SI/HI and AH/VH, however his mannerisms suggest he is still experiencing some psychotic symptoms. Although there has been some improvement in his impulse control since being in the ED, pt continues to display some symptoms that suggest he is experiencing some psychosis. His eye contact is fair and mood is depressed, although he denies depressive symptoms. He presents with flat and guarded affect as evidenced by sitting on the edge of his bed with ridged legs and arms crossed over his torso. He is curt in his replies, using only "yes" and "no" answers. When asked to elaborate on how he is feeling, he states "fine". Pt request to be moved from the ED and expresses that he does not understand why he is being held here if he "has a place to stay". Pt expressed wanting to be transferred to [facility]. Pt displays poor insight into his illness, however demonstrates good impulse control and fair judgement. His thought process is coherent and circumstantial at times. He reports to sleeping well and maintaining a good appetite.  Pt has been on wait list at [facility] and TA has been seeking placement. [facility] re-reviewed information today and accepted to their facility.  Axis I: Psychotic Disorder NOS Axis II: Deferred Axis III:  Past Medical History  Diagnosis Date  . Paranoid schizophrenia   . Drug abuse     xanax addiction 5 years ago  . Alcoholism    Axis IV: Chronic Mental Illness Axis V: 31-40 impairment in reality testing  Past Medical History:  Past Medical History  Diagnosis  Date  . Paranoid schizophrenia   . Drug abuse     xanax addiction 5 years ago  . Alcoholism     History reviewed. No pertinent past surgical history.  Family History: No family history on file.  Social History:  reports that he has never smoked. He does not have any smokeless tobacco history on file. He reports that he does not drink alcohol or use illicit drugs.  Additional Social History:    Allergies: No Known Allergies  Home Medications:  Medications Prior to Admission  Medication Dose Route Frequency Provider Last Rate Last Dose  . acetaminophen (TYLENOL) tablet 650 mg  650 mg Oral Q4H PRN Ward Givens, MD      . alum & mag hydroxide-simeth (MAALOX/MYLANTA) 200-200-20 MG/5ML suspension 30 mL  30 mL Oral PRN Ward Givens, MD      . divalproex (DEPAKOTE SPRINKLE) capsule 500 mg  500 mg Oral Q8H Ward Givens, MD   500 mg at 01/18/12 1350  . ibuprofen (ADVIL,MOTRIN) tablet 600 mg  600 mg Oral Q8H PRN Ward Givens, MD      . LORazepam (ATIVAN) injection 1 mg  1 mg Intramuscular Once Rolan Bucco, MD   2 mg at 01/15/12 1321  . LORazepam (ATIVAN) tablet 1 mg  1 mg Oral Once Lonia Skinner [place], [place]   1 mg at 01/14/12 2052  . LORazepam (ATIVAN) tablet 1 mg  1 mg Oral Q8H PRN Ward Givens, MD      .  nicotine (NICODERM CQ - dosed in mg/24 hours) patch 21 mg  21 mg Transdermal Daily Ward Givens, MD      . ondansetron (ZOFRAN) tablet 4 mg  4 mg Oral Q8H PRN Ward Givens, MD      . QUEtiapine (SEROQUEL XR) 24 hr tablet 800 mg  800 mg Oral QHS Ward Givens, MD   800 mg at 01/17/12 2219  . sertraline (ZOLOFT) tablet 100 mg  100 mg Oral QHS Ward Givens, MD   100 mg at 01/17/12 2219  . ziprasidone (GEODON) 20 MG injection        20 mg at 01/14/12 2249  . zolpidem (AMBIEN) tablet 10 mg  10 mg Oral QHS PRN Ward Givens, MD   10 mg at 01/15/12 2216  . DISCONTD: LORazepam (ATIVAN) tablet 1 mg  1 mg Oral Once Rolan Bucco, MD      . DISCONTD: ziprasidone (GEODON) injection 20 mg  20 mg Intramuscular Once Rolan Bucco, MD       Medications Prior to Admission  Medication Sig Dispense Refill  . divalproex (DEPAKOTE ER) 500 MG 24 hr tablet Take 1,500 mg by mouth at bedtime.      Marland Kitchen QUEtiapine (SEROQUEL XR) 400 MG 24 hr tablet Take 400 mg by mouth at bedtime.      . sertraline (ZOLOFT) 100 MG tablet Take 100 mg by mouth daily.        OB/GYN Status:  No LMP for male patient.  General Assessment Data Location of Assessment: [facility] Living Arrangements: Alone Can pt return to current living arrangement?: Yes Admission Status: Involuntary Is patient capable of signing voluntary admission?: No Transfer from: Acute Hospital Referral Source: Other ([facility])     Risk to self Suicidal Ideation: No Suicidal Intent: No Is patient at risk for suicide?: No Suicidal Plan?: No Access to Means: No Previous Attempts/Gestures: Yes How many times?: 1  Other Self Harm Risks: n/a Triggers for Past Attempts: Unknown Intentional Self Injurious Behavior: None Family Suicide History: Unknown Persecutory voices/beliefs?: Yes Depression: No Substance abuse history and/or treatment for substance abuse?: No Suicide prevention information given to non-admitted patients: Not applicable  Risk to Others Homicidal Ideation: No Thoughts of Harm to Others: No Current Homicidal Intent: No Current Homicidal Plan: No Access to Homicidal Means: No Identified Victim: n/a History of harm to others?: No Assessment of Violence: None Noted Does patient have access to weapons?: No Criminal Charges Pending?: No Does patient have a court date: No  Psychosis Hallucinations: Visual;Auditory Delusions: Persecutory  Mental Status Report Appear/Hygiene: Other (Comment) (Unremarkable -hospital scrubs) Eye Contact: Fair Motor Activity: Mannerisms Speech:  (Blocked) Level of Consciousness: Alert Mood: Depressed Affect:  (guarded) Anxiety Level: None Thought Processes:  Circumstantial Judgement: Impaired Orientation: Person;Place;Time;Situation Obsessive Compulsive Thoughts/Behaviors: Minimal  Cognitive Functioning Memory: Recent Intact;Remote Intact IQ: Average Insight: Poor Impulse Control: Good Appetite: Good Weight Loss: 0  Weight Gain: 0  Sleep: No Change Total Hours of Sleep: 8  Vegetative Symptoms: None                  Values / Beliefs Cultural Requests During Hospitalization: None Spiritual Requests During Hospitalization: None     Nutrition Screen Diet: Regular  Additional Information 1:1 In Past 12 Months?: No CIRT Risk: No Elopement Risk: No Does patient have medical clearance?: Yes     Disposition:  Disposition Disposition of Patient: Referred to (Accepted [facility] (400-[facility])) Patient  referred to:  [facility])  On Site Evaluation by:   Reviewed with Physician:     Romeo Apple 01/18/2012 3:02 PM

## 2012-01-18 NOTE — ED Notes (Signed)
CSW contacted [facility] and spoke with Estill Dooms to notify that pt has been accepted at [facility].

## 2012-01-18 NOTE — ED Provider Notes (Signed)
Patient resting comfortably without complaints. Vital signs are stable with exception of slightly elevated blood pressure at 145/80  Hilario Quarry, MD 01/18/12 [phone]

## 2012-01-19 DIAGNOSIS — F200 Paranoid schizophrenia: Secondary | ICD-10-CM | POA: Diagnosis present

## 2012-01-19 MED ORDER — QUETIAPINE FUMARATE ER 400 MG PO TB24
400.0000 mg | ORAL_TABLET | Freq: Every day | ORAL | Status: DC
  Administered 2012-01-19 – 2012-01-20 (×2): 400 mg via ORAL
  Filled 2012-01-19 (×3): qty 1

## 2012-01-19 MED ORDER — DIVALPROEX SODIUM ER 500 MG PO TB24
1500.0000 mg | ORAL_TABLET | Freq: Every day | ORAL | Status: DC
  Administered 2012-01-19 – 2012-01-27 (×9): 1500 mg via ORAL
  Filled 2012-01-19 (×12): qty 3

## 2012-01-19 MED ORDER — SERTRALINE HCL 100 MG PO TABS: 100.0000 mg | ORAL_TABLET | Freq: Every day | ORAL | Status: DC

## 2012-01-19 NOTE — H&P (Signed)
Psychiatric Admission Assessment Adult  Patient Identification:  Albert [name] Date of Evaluation:  01/19/2012 35yo SWM CC: hallucinations and bizarre behavior History of Present Illness: Was riding his bike and felt an energy following him. Had been fasting for Alwyn Pea and not sleeeping correctly. Called his step -dad who brought him to the ED. Father suspects non-compliance.    Past Psychiatric History: Has admissions here back to 2001. Most recent admission here was in Feb.2011 same type of presentation.  Substance Abuse History:  Social History:    reports that he has never smoked. He does not have any smokeless tobacco history on file. He reports that he does not drink alcohol or use illicit drugs.  Family Psych History: N/A  Past Medical History:     Past Medical History  Diagnosis Date  . Paranoid schizophrenia   . Drug abuse     xanax addiction 5 years ago  . Alcoholism   . Seizures       No past surgical history on file.  Allergies: No Known Allergies  Current Medications:  Prior to Admission medications   Medication Sig Start Date End Date Taking? Authorizing Provider  divalproex (DEPAKOTE ER) 500 MG 24 hr tablet Take 1,500 mg by mouth at bedtime.    Historical Provider, MD  QUEtiapine (SEROQUEL XR) 400 MG 24 hr tablet Take 400 mg by mouth at bedtime.    Historical Provider, MD  sertraline (ZOLOFT) 100 MG tablet Take 100 mg by mouth daily.    Historical Provider, MD    Mental Status Examination/Evaluation: Objective:  Appearance: Neat  Psychomotor Activity:  Normal  Eye Contact::  Good  Speech:  Normal Rate  Volume:  Normal  Mood: calm    Affect:  odd -   Thought Process:clear rational goal oriented     Orientation:  Full  Thought Content:  talks of the energy in the past tense   Suicidal Thoughts:  No  Homicidal Thoughts:  No  Judgement:  Impaired  Insight:  Shallow    DIAGNOSIS:    AXIS I Schizoaffective Disorder  AXIS II Deferred  AXIS  III See medical history.  AXIS IV non-compliance  and chroni mental illness   AXIS V 31-40 impairment in reality testing     Treatment Plan Summary: Admit for safety & stabilization  Adjust meds as indicated

## 2012-01-19 NOTE — Progress Notes (Signed)
Patient ID: Albert [name], male   DOB: 1976-01-04, 36 y.o.   MRN: [id number]   [facility] Group Notes:  (Counselor/Nursing/MHT/Case Management/Adjunct)  01/19/2012 11 AM  Type of Therapy:  Group Therapy, Dance/Movement Therapy   Participation Level:  Minimal  Participation Quality:  Inattentive  Affect:  Flat  Cognitive:  Oriented  Insight:  Limited  Engagement in Group:  Limited  Engagement in Therapy:  Limited  Modes of Intervention:  Clarification, Problem-solving, Role-play, Socialization and Support  Summary of Progress/Problems:  Pt shared that he was feeling like the color blue because it reminds him to "dont take life so seriously." Group experienced how movement can shift them from indulging in self sabotaging and enabling behaviors to having motivation and a positive outlook in order to be successful in recovery. Pt stood up with the group member, but he just stood still with his eye fixed towards the floor with a blank expression. He contributed to the group process but he was not able to clearly explain his intentions and lost his concentration. This suggests that he is stuck and unclear of a recovery plan.    Thomasena Edis, [employer]

## 2012-01-19 NOTE — [facility] Suicide Risk Assessment (Signed)
Suicide Risk Assessment  Admission Assessment     Demographic factors:  Assessment Details Time of Assessment: Admission Information Obtained From: Patient Current Mental Status:    Loss Factors:    Historical Factors:  Historical Factors: Family history of mental illness or substance abuse Risk Reduction Factors:  Risk Reduction Factors: Positive social support;Sense of responsibility to family  CLINICAL FACTORS:   Bipolar Disorder:   Bipolar II  COGNITIVE FEATURES THAT CONTRIBUTE TO RISK:  Thought constriction (tunnel vision)    SUICIDE RISK:   Moderate:  Frequent suicidal ideation with limited intensity, and duration, some specificity in terms of plans, no associated intent, good self-control, limited dysphoria/symptomatology, some risk factors present, and identifiable protective factors, including available and accessible social support.  PLAN OF CARE:  Monitor for thought disorder.  Pt expresses thoughts with references to religious themes.  He admits to Auditory Hallucinations.  He denies active suicidal plans. Continue to monitor response to medication and religiosity AH.  IVC is recommended.   Albert [name] 01/19/2012, 4:29 PM

## 2012-01-19 NOTE — Progress Notes (Signed)
Met with pt 1:1. He is requesting a new roommate because of "comments he (the roommate) made earlier." "I don't feel safe sleeping in the room." Pt ruminating over a comment roommate made earlier in day for which he has sincerely apologized for (in staff's presence) multiple time. Pt unable to let it go, remains paranoid. Suspicious of peers and staff at times. Pt moved to different room. Medicated per orders. Explained increase in depakote as well as the initiation of XR.  He denies having any problems with his thinking or mood stating, "I feel great." Denies SI/HI/AVH. Lawrence Marseilles

## 2012-01-19 NOTE — Progress Notes (Signed)
Patient ID: Albert [name], male   DOB: 02-Dec-1975, 36 y.o.   MRN: [id number]  Pt. attended and participated in aftercare planning group. Pt. Denied S/I and H/I. Pt accepted information on suicide prevention, warning signs to look for with suicide and crisis line numbers to use. After group, pt returned SPI to case manager. Pt. listed their current anxiety and depression levels both as 3 or 4.

## 2012-01-19 NOTE — Progress Notes (Signed)
Pt resting quietly with eyes closed. Respirations even and unlabored. No distress noted. Q 15 min check continues to maintain safety.

## 2012-01-19 NOTE — Progress Notes (Signed)
Slept okay last nite, has been going to the DR for meals, after breakfast this morning he went back to bed and has been sleeping, not attending group, takes meds as ordered by MD, pleasant and cooperative. Denies Si or Hi. q35min safety checks continue and support offered Safety maintained

## 2012-01-20 DIAGNOSIS — F29 Unspecified psychosis not due to a substance or known physiological condition: Secondary | ICD-10-CM

## 2012-01-20 NOTE — Progress Notes (Signed)
Patient ID: Albert [name], male   DOB: 11/01/1975, 36 y.o.   MRN: [id number]  Pt. attended and participated in aftercare planning group. Pt declined to receive suicide prevention information. Pt. listed their current anxiety level as 1 and his depression as a 2.

## 2012-01-20 NOTE — Progress Notes (Signed)
Patient ID: Albert [name], male   DOB: 1976/03/28, 36 y.o.   MRN: [id number]  [facility] Group Notes:  (Counselor/Nursing/MHT/Case Management/Adjunct)  01/20/2012 11 AM  Type of Therapy:  Group Therapy, Dance/Movement Therapy   Participation Level:  Minimal  Participation Quality:  Inattentive  Affect:  Blunted  Cognitive:  Confused  Insight:  None  Engagement in Group:  Limited  Engagement in Therapy:  Limited  Modes of Intervention:  Clarification, Problem-solving, Role-play, Socialization and Support  Summary of Progress/Problems:  Pt named that his father is a source of support in his life. Group discussed healthy and unhealthy forms of supporting or not supporting themselves when their external supports are not available. The group made a commitment to spend time with at least one peer later today as a way to practice giving and receiving emotional support. Pt would consistently lean over to peer next to him and ask what was happening. Pt sat motionless and with a blank facial expression that appeared pale. Pt seems to be unclear cognitively and unable to focus on developing a recovery plan.     Thomasena Edis, [employer]

## 2012-01-20 NOTE — Progress Notes (Signed)
01-20-12 nursing shift note: pt had rn fill out his inventory sheet. He stated that he slept well, appetite good, energy level normal, attention improving with his depression and hopelessness at 2. No withdrawal symptoms. Denied any thoughts of suicide or hurting himself. No physical problems and no pain. He stated that their are " no necessary changes need to be made" when he goes home. He stated he didn't see a problems staying on medications after discharge. rn will monitor and q 15 minutes checks continue.

## 2012-01-20 NOTE — [facility] Counselor (Signed)
Adult Comprehensive Assessment  Patient ID: Albert [name], male   DOB: 11/26/1975, 36 y.o.   MRN: [id number]  Information Source: Information source: Patient  Current Stressors:  Educational / Learning stressors: None reported Employment / Job issues: None reported Family Relationships: None reported Surveyor, quantity / Lack of resources (include bankruptcy): None reported Housing / Lack of housing: None reported Physical health (include injuries & life threatening diseases): None reported Social relationships: None reported Substance abuse: None reported Bereavement / Loss: None reported  Living/Environment/Situation:  Living Arrangements: Alone Living conditions (as described by patient or guardian): Good How long has patient lived in current situation?: Since 2007 What is atmosphere in current home: Comfortable  Family History:  Marital status: Single Does patient have children?: No  Childhood History:  By whom was/is the patient raised?: Both parents Additional childhood history information: Mother mainly took care of [place] Description of patient's relationship with caregiver when they were a child: Good Patient's description of current relationship with people who raised him/her: Good Does patient have siblings?: Yes Number of Siblings: 3  (Has a twin sister and a half brother/sister) Description of patient's current relationship with siblings: Pretty good Did patient suffer any verbal/emotional/physical/sexual abuse as a child?: No Did patient suffer from severe childhood neglect?: No Has patient ever been sexually abused/assaulted/raped as an adolescent or adult?: No Was the patient ever a victim of a crime or a disaster?: No Witnessed domestic violence?: No Has patient been effected by domestic violence as an adult?: No  Education:  Highest grade of school patient has completed: 12th grade Currently a student?: No Learning disability?: No  Employment/Work Situation:     Employment situation: On disability Why is patient on disability: [employer]/Depression How long has patient been on disability: Just getting disability Patient's job has been impacted by current illness: No What is the longest time patient has a held a job?: 3 years 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 Has patient ever been in the [employer]?: No Has patient ever served in Buyer, retail?: No  Financial Resources:   Surveyor, quantity resources: Insurance claims handler Does patient have a Lawyer or guardian?: No  Alcohol/Substance Abuse:   What has been your use of drugs/alcohol within the last 12 months?: None If attempted suicide, did drugs/alcohol play a role in this?: No Alcohol/Substance Abuse Treatment Hx: Denies past history If yes, describe treatment: N/A Has alcohol/substance abuse ever caused legal problems?: No  Social Support System:   Conservation officer, nature Support System: Good Describe Community Support System: Family Type of faith/religion: [place] How does patient's faith help to cope with current illness?: Sometime  Leisure/Recreation:   Leisure and Hobbies: Bike riding, Orthoptist, conversations with others  Strengths/Needs:   What things does the patient do well?: Honesty, loyalty and integrity.  Learning to have good human relations In what areas does patient struggle / problems for patient: Dealing with people that are pushy and that rush me  Discharge Plan:   Does patient have access to transportation?: No (Not sure if the father can pick him up) Plan for no access to transportation at discharge: Cab? Will patient be returning to same living situation after discharge?: Yes Currently receiving community mental health services: Yes (From Whom) ([facility] If no, would patient like referral for services when discharged?: Yes (What county?) Medical sales representative) Does patient have financial barriers related to discharge medications?:  No  Summary/Recommendations:   Summary and Recommendations (to be completed by the evaluator): Pt. is a 35 yr.  old males.  Recommendations for treatment include crisis stabilization, case mgmt., medication mgmt., psycoeducation to teach coping skills and group therapy  Rhunette Croft. 01/20/2012

## 2012-01-20 NOTE — Progress Notes (Signed)
[facility] Adult Inpatient Family/Significant Other Suicide Prevention Education  Suicide Prevention Education:  Education Completed; Mr Araceli Bouche (step father) [phone],  (name of family member/significant other) has been identified by the patient as the family member/significant other with whom the patient will be residing, and identified as the person(s) who will aid the patient in the event of a mental health crisis (suicidal ideations/suicide attempt).  With written consent from the patient, the family member/significant other has been provided the following suicide prevention education, prior to the and/or following the discharge of the patient.  The suicide prevention education provided includes the following:  Suicide risk factors  Suicide prevention and interventions  National Suicide Hotline telephone number  [facility] assessment telephone number  [facility] Emergency Assistance 911  [facility] and/or Residential Mobile Crisis Unit telephone number  Request made of family/significant other to:  Remove weapons (e.g., guns, rifles, knives), all items previously/currently identified as safety concern.    Remove drugs/medications (over-the-counter, prescriptions, illicit drugs), all items previously/currently identified as a safety concern.  The family member/significant other verbalizes understanding of the suicide prevention education information provided.  The family member/significant other agrees to remove the items of safety concern listed above.  Step father answered the telephone and quickly passed the phone to the pt.'s mother who did not ask for any information about the pt. She stated that she would like the pt to go to [facility] and have an ACT team help him. She also stated that the pt would not call her or the step father if needling help for he is "bipolar" and does not consider them supports. She did say that the pt has no safety concerns at his  home but needs support and someone to go to him. She also stated that the pt is very "secretive" and does not tell them much. She stated she would like to call the pt and needs his code, pt was given this message.  [facility] 01/20/2012, 3:48 PM

## 2012-01-20 NOTE — Progress Notes (Signed)
Pt has been up and has been visible in milieu today and has been attending and participating in various milieu activities, pt has been receiving medications without incident, pt has not verbalized any complaints, support provided, will continue to monitor

## 2012-01-20 NOTE — Progress Notes (Signed)
[facility] MD Progress Note  01/20/2012 3:04 PM  Diagnosis:  Axis I: Schizoaffective Disorder  ADL's:  Intact  Sleep: Good  Appetite:  Good  Suicidal Ideation:  He has no suicidal ideations, intension or plans / means Homicidal Ideation:  He has no homicidal ideations, intension or plan / means  AEB (as evidenced by):He has been compliant with medication and has no reported adverse effects.  He has been participating on unit activities without agitation or aggressive behaviors.   Mental Status Examination/Evaluation: Objective:  Appearance: Casual  Eye Contact::  Good  Speech:  Clear and Coherent  Volume:  Normal  Mood:  Euthymic  Affect:  Appropriate, Congruent and Full Range  Thought Process:  Coherent and Goal Directed  Orientation:  Full  Thought Content:  WDL  Suicidal Thoughts:  No  Homicidal Thoughts:  No  Memory:  Immediate;   Good Recent;   Good  Judgement:  Fair  Insight:  Present  Psychomotor Activity:  Normal  Concentration:  Fair  Recall:  Fair  Akathisia:  No  Handed:  Right  AIMS (if indicated):     Assets:  Communication Skills Desire for Improvement Physical Health Social Support  Sleep:  Number of Hours: 4.5    Vital Signs:Blood pressure 113/71, pulse 100, temperature 97.1 F (36.2 C), temperature source Oral, resp. rate 20, height 5\' 9"  (1.753 m), weight 211 lb (95.709 kg). Current Medications: Current Facility-Administered Medications  Medication Dose Route Frequency Provider Last Rate Last Dose  . acetaminophen (TYLENOL) tablet 650 mg  650 mg Oral Q6H PRN Sanjuana Kava, NP      . alum & mag hydroxide-simeth (MAALOX/MYLANTA) 200-200-20 MG/5ML suspension 30 mL  30 mL Oral Q4H PRN Sanjuana Kava, NP      . alum & mag hydroxide-simeth (MAALOX/MYLANTA) 200-200-20 MG/5ML suspension 30 mL  30 mL Oral Q4H PRN Viviann Spare, NP      . divalproex (DEPAKOTE ER) 24 hr tablet 1,500 mg  1,500 mg Oral QHS Mickie D. Adams, PA   1,500 mg at 01/19/12 2122  .  hydrOXYzine (ATARAX/VISTARIL) tablet 25 mg  25 mg Oral TID PRN Sanjuana Kava, NP      . ibuprofen (ADVIL,MOTRIN) tablet 600 mg  600 mg Oral Q8H PRN Viviann Spare, NP      . LORazepam (ATIVAN) tablet 1 mg  1 mg Oral Q8H PRN Viviann Spare, NP      . magnesium hydroxide (MILK OF MAGNESIA) suspension 30 mL  30 mL Oral Daily PRN Sanjuana Kava, NP      . magnesium hydroxide (MILK OF MAGNESIA) suspension 30 mL  30 mL Oral Daily PRN Viviann Spare, NP      . ondansetron (ZOFRAN) tablet 4 mg  4 mg Oral Q8H PRN Viviann Spare, NP      . QUEtiapine (SEROQUEL XR) 24 hr tablet 400 mg  400 mg Oral QHS Mickie D. Adams, PA   400 mg at 01/19/12 2122  . sertraline (ZOLOFT) tablet 100 mg  100 mg Oral QHS Viviann Spare, NP   100 mg at 01/19/12 2122  . DISCONTD: divalproex (DEPAKOTE SPRINKLE) capsule 500 mg  500 mg Oral Q8H Viviann Spare, NP   500 mg at 01/19/12 1356  . DISCONTD: QUEtiapine (SEROQUEL XR) 24 hr tablet 800 mg  800 mg Oral QHS Viviann Spare, NP   800 mg at 01/18/12 2101  . DISCONTD: sertraline (ZOLOFT) tablet 100 mg  100 mg  Oral Daily Mickie D. Adams, PA        Lab Results: No results found for this or any previous visit (from the past 48 hour(s)).  Physical Findings: AIMS:  , ,  ,  ,    CIWA:    COWS:     Treatment Plan Summary: Daily contact with patient to assess and evaluate symptoms and progress in treatment Medication management  Plan: Continue current treatment plan and no medication changed during this visit.   Haru Anspaugh,JANARDHAHA R. 01/20/2012, 3:04 PM

## 2012-01-21 DIAGNOSIS — F200 Paranoid schizophrenia: Secondary | ICD-10-CM

## 2012-01-21 DIAGNOSIS — F259 Schizoaffective disorder, unspecified: Principal | ICD-10-CM

## 2012-01-21 MED ORDER — SERTRALINE HCL 100 MG PO TABS
100.0000 mg | ORAL_TABLET | Freq: Every day | ORAL | Status: DC
  Administered 2012-01-22 – 2012-01-28 (×7): 100 mg via ORAL
  Filled 2012-01-21 (×9): qty 1

## 2012-01-21 MED ORDER — QUETIAPINE FUMARATE ER 400 MG PO TB24
800.0000 mg | ORAL_TABLET | Freq: Every day | ORAL | Status: DC
  Administered 2012-01-21 – 2012-01-27 (×7): 800 mg via ORAL
  Filled 2012-01-21 (×3): qty 2
  Filled 2012-01-21: qty 1
  Filled 2012-01-21 (×6): qty 2

## 2012-01-21 NOTE — Progress Notes (Signed)
[facility] MD Progress Note  01/21/2012 1:06 PM  Diagnosis:  Axis I: Schizoaffective Disorder - Bipolar Type.   The patient was seen today and reports the following:   ADL's: Intact.  Sleep: The patient reports to now sleeping well without difficulty but according to staff he slept 3.5 hours. Appetite: The patient reports a good appetite today.   Mild>(1-10) >Severe  Hopelessness (1-10): 0  Depression (1-10): 0  Anxiety (1-10): 0   Suicidal Ideation: The patient denies any suicidal ideation today.  Plan: No  Intent: No  Means: No   Homicidal Ideation: The patient denies any homicidal ideations today.  Plan: No  Intent: No.  Means: No   General Appearance/Behavior: Appropriate and cooperative today.  Eye Contact: Good.  Speech: Appropriate in rate and volume today with no pressuring noted.  Motor Behavior: wnl.  Level of Consciousness: Alert and Oriented x 3.  Mental Status: Alert and Oriented x 3.  Mood: Mildly Depressed.  Affect: Moderately Constricted.  Anxiety Level: No anxiety noted or reported.  Thought Process: wnl.  Thought Content: The patient denies any auditory or visual hallucinations today. He also denies any delusional thinking today but appeared guarded and paranoid. Perception:. wnl.  Judgment: Fair.  Insight: Fair.  Cognition: Oriented to place and person.  Sleep:  Number of Hours: 3.75    Vital Signs:Blood pressure 110/74, pulse 112, temperature 97 F (36.1 C), temperature source Oral, resp. rate 20, height 5\' 9"  (1.753 m), weight 95.709 kg (211 lb).  Current Medications: Current Facility-Administered Medications  Medication Dose Route Frequency Provider Last Rate Last Dose  . alum & mag hydroxide-simeth (MAALOX/MYLANTA) 200-200-20 MG/5ML suspension 30 mL  30 mL Oral Q4H PRN Viviann Spare, NP      . divalproex (DEPAKOTE ER) 24 hr tablet 1,500 mg  1,500 mg Oral QHS Curlene Labrum Janesia Joswick, MD   1,500 mg at 01/20/12 2157  . hydrOXYzine (ATARAX/VISTARIL) tablet  25 mg  25 mg Oral TID PRN Sanjuana Kava, NP      . ibuprofen (ADVIL,MOTRIN) tablet 600 mg  600 mg Oral Q8H PRN Viviann Spare, NP      . LORazepam (ATIVAN) tablet 1 mg  1 mg Oral Q8H PRN Viviann Spare, NP      . magnesium hydroxide (MILK OF MAGNESIA) suspension 30 mL  30 mL Oral Daily PRN Viviann Spare, NP      . ondansetron (ZOFRAN) tablet 4 mg  4 mg Oral Q8H PRN Viviann Spare, NP      . QUEtiapine (SEROQUEL XR) 24 hr tablet 800 mg  800 mg Oral QHS Curlene Labrum Avian Konigsberg, MD      . sertraline (ZOLOFT) tablet 100 mg  100 mg Oral Q breakfast Ronny Bacon, MD      . DISCONTD: acetaminophen (TYLENOL) tablet 650 mg  650 mg Oral Q6H PRN Sanjuana Kava, NP      . DISCONTD: alum & mag hydroxide-simeth (MAALOX/MYLANTA) 200-200-20 MG/5ML suspension 30 mL  30 mL Oral Q4H PRN Sanjuana Kava, NP      . DISCONTD: magnesium hydroxide (MILK OF MAGNESIA) suspension 30 mL  30 mL Oral Daily PRN Sanjuana Kava, NP      . DISCONTD: QUEtiapine (SEROQUEL XR) 24 hr tablet 400 mg  400 mg Oral QHS Mickie D. Adams, PA   400 mg at 01/20/12 2156  . DISCONTD: sertraline (ZOLOFT) tablet 100 mg  100 mg Oral QHS Viviann Spare, NP   100 mg  at 01/20/12 2157   Lab Results: No results found for this or any previous visit (from the past 48 hour(s)).  Time was spent with the patient discussing the situation leading to his admission.  The patient was vague but stated that he needed to get back on his medications.  He states he had become non-compliant with his medications with a return of his depression.  He reported not to being able to sleep well for the 4 days prior to admission and reported no eating for 3 days prior to admission.  The patient also appeared on interview to be somewhat guarded and paranoid.  Treatment Plan Summary:  1. Daily contact with patient to assess and evaluate symptoms and progress in treatment  2. Medication management  3. The patient will deny suicidal ideations or homicidal ideations for 48  hours prior to discharge and have a depression and anxiety rating of 3 or less. The patient will also deny any auditory or visual hallucinations or delusional thinking.   Plan:  1. Will continue current medications.  2. Will increase the medication Seroquel XR to his home dosage of 800 mgs po qhs for psychosis and mood stabilization. 3. Will order a serum Depakote level for tonight as well as a TSH, Free T3 and Free T4 to assess the patient's thyroid functioning as well as to obtain a current Depakote Level. 4. Laboratory studies reviewed.  5. Will continue to monitor.   Brayla Pat 01/21/2012, 1:06 PM

## 2012-01-21 NOTE — Progress Notes (Signed)
Patient has been in the dayroom watching tv sitting beside his roommate with no interaction.  Writer sat in the dayroom for a while and chatted with another patient and observed how patient watched/stared at peers but no interaction. Writer spoke with patient 1:1 but his answers were yes or no  and no more. Patient voiced no complaints and was informed of his hs medications. Patient denies having pain, -si/hi and visual hall. pt. appears to be responding to internal stimuli. Safety maintained on unit, will continue to monitor.

## 2012-01-21 NOTE — Tx Team (Signed)
Interdisciplinary Treatment Plan Update (Adult)  Date:  01/21/2012  Time Reviewed:  10:15AM-11:00AM  Progress in Treatment: Attending groups:  Not yet Participating in groups:    No Taking medication as prescribed:    Yes Tolerating medication:   Yes Family/Significant other contact made:  Yes, with stepfather and mother Patient understands diagnosis:   Yes, poor judgment, limited insight Discussing patient identified problems/goals with staff:  Yes, although with considerable paranoia Medical problems stabilized or resolved:   Yes Denies suicidal/homicidal ideation:  Yes Issues/concerns per patient self-inventory:   None Other:    New problem(s) identified: No, Describe:    Reason for Continuation of Hospitalization: Hallucinations Medication stabilization Other; describe paranoia  Interventions implemented related to continuation of hospitalization:  Medication monitoring and adjustment, safety checks Q15 min., suicide risk assessment, group therapy, psychoeducation, collateral contact, aftercare planning, ongoing physician assessments, medication education  Additional comments:  Not applicable  Estimated length of stay:  3-4 days  Discharge Plan:  Return to his apartment alone and follow up with either [facility] (for counseling) and [facility]/Dr. Betti Cruz (for med mgmt) or [facility] for both.  New goal(s):  Not applicable  Review of initial/current patient goals per problem list:   1.  Goal(s):  Reduce auditory/visual hallucinations to baseline.  Met:  No  Target date:  By Discharge   As evidenced by:  Needs time to assess  2.  Goal(s):  Reduce paranoia to baseline.  Met:  No  Target date:  By Discharge   As evidenced by:  Continues to appear paranoid  3.  Goal(s):  Medication stabilization.  Met:  No  Target date:  By Discharge   As evidenced by:  Concerned about Seroquel being reduce, and doctor will look at this  4.  Goal(s):   Return sleep to normal pattern of 8 hours per night.  Met:  Yes  Target date:  By Discharge   As evidenced by:  Feels has achieved this goal  Attendees: Patient:  Albert [name]  01/21/2012 10:15AM-11:00AM  Family:     Physician:  Dr. Harvie Heck Readling 01/21/2012 10:15AM-11:00AM  Nursing:   Tacy Learn, RN 01/21/2012 10:15AM -11:00AM   Case Manager:  Ambrose Mantle, LCSW 01/21/2012 10:15AM-11:00AM  Counselor:  Veto Kemps, MT-BC 01/21/2012 10:15AM-11:00AM  Other:   Lynann Bologna, NP 01/21/2012 10:15AM-11:00AM  Other:      Other:      Other:       Scribe for Treatment Team:   Sarina Ser, 01/21/2012, 10:15AM-11:15AM

## 2012-01-21 NOTE — Progress Notes (Signed)
[facility] Group Notes:  (Counselor/Nursing/MHT/Case Management/Adjunct)  01/21/2012 2:38 PM  Type of Therapy:  Group Therapy  Participation Level:  Active  Participation Quality:  Attentive and Sharing  Affect:  Blunted  Cognitive:  Oriented  Insight:  Limited  Engagement in Group:  Good  Engagement in Therapy:  Good  Modes of Intervention:  Problem-solving and Support  Summary of Progress/Problems: Patient stated that he just wants to take it easy, and talked about his lifestyle of spending time on [employer]. in coffee shops, journaling, etc. Likes to help others as well.   HartisAram Beecham 01/21/2012, 2:38 PM

## 2012-01-21 NOTE — Progress Notes (Signed)
Pt has been up and has been active while in the milieu today, has been actively participating in activities, pt has spoken about wanting to work on getting better sleep, pt has denied having any feelings of depression or auditory hallucinations, pt has not verbalized any complaints, will continue to monitor

## 2012-01-21 NOTE — Discharge Planning (Addendum)
Met with patient in Treatment Team, as he did not attend Aftercare Planning Group.  Patient demonstrated paranoia as we were discussing his goals, but accepted explanation of how those goals were being written about things he wants to work on.  Patient lives alone and will return at discharge.  He states that he will likely get a family member to transport him home at discharge.  Patient currently sees Dr. Betti Cruz at [facility] for medication management and a counselor at [facility] for counseling.  He states that he may start to go to [facility] for both.    Patient states he has learned not to use sleeping/fasting as a means to observe Alwyn Pea, but that is not a concern since Alwyn Pea will not occur again for another year.  Patient's mother told other staff she believes patient needs an ACT Team.  Patient was too paranoid to approach with this idea at this time.  Ambrose Mantle, LCSW 01/21/2012, 11:49 AM  Per State Regulation 482.30  This chart was reviewed for medical necessity with respect to the patient's Admission/Duration of stay.   Next review due:  01/24/12   Ambrose Mantle, LCSW  01/21/2012  11:50 AM

## 2012-01-22 NOTE — Progress Notes (Signed)
Patient ID: Albert [name], male   DOB: 08-09-1976, 36 y.o.   MRN: [id number] Pt denies SI/HI/anxiety, depression, hopelessness, and pain.  Less bizarre today, odd affect remains, more interactive and less guarded.

## 2012-01-22 NOTE — Progress Notes (Signed)
The sitter for another pt reported that while sitting in the dayroom with her client, this pt was acting bizarre, staring, looking around the room.  When she asked if he was "ok" and "is anything wrong?", pt became very defensive and hostile towards her.  She reported he said' "What do you mean 'is anything wrong?' What do you mean by that?"  Sitter says she told him she was just concerned.  He calmed down on his own.  This Clinical research associate told sitter if anything else occurred, alert the MHTs on the hall.  Sitter voiced understanding.  Continue safety checks q15 minutes.

## 2012-01-22 NOTE — Progress Notes (Signed)
Pt observed in the dayroom watching TV.  Pt is cooperative when asked to speak to him in his room before group time.  When conversing with pt, he is highly suspicious and paranoid with the questions and statements by this Clinical research associate.  He asks about Zoloft and says he takes it at night.  Assure pt that this Clinical research associate will check on his concerns.  He talks about someone named Georgie Chard, who counsels with him.  The entire time in conversation pt has an intense stare.  He denies any discomfort.  He denies any thoughts of self harm.  Safety maintained with q15 minute checks.

## 2012-01-22 NOTE — Progress Notes (Signed)
Patient in day room interacting with peers. He reports having a good day so far. Denies SI/HI and denies hallucinations. His mood and affect appropriate to situation. Denies any problem or concern. Q 15 minute check continues to maintain safety.

## 2012-01-22 NOTE — Progress Notes (Signed)
[facility] MD Progress Note  01/22/2012 2:39 PM  Diagnosis:  Axis I: Schizoaffective Disorder - Bipolar Type.   The patient was seen today and reports the following:   ADL's: Intact.  Sleep: The patient reports to sleeping reasonably well last night. Appetite: The patient reports a good appetite today.   Mild>(1-10) >Severe  Hopelessness (1-10): 0  Depression (1-10): 0  Anxiety (1-10): 0   Suicidal Ideation: The patient adamantly denies any suicidal ideation today.  Plan: No  Intent: No  Means: No   Homicidal Ideation: The patient adamantly denies any homicidal ideations today.  Plan: No  Intent: No.  Means: No   General Appearance/Behavior: The patient remains appropriate and cooperative today.  Eye Contact: Good.  Speech: Appropriate in rate and volume today with no pressuring noted.  Motor Behavior: wnl.  Level of Consciousness: Alert and Oriented x 3.  Mental Status: Alert and Oriented x 3.  Mood: Mildly Depressed.  Affect: Moderately Constricted.  Anxiety Level: No anxiety noted or reported.  Thought Process: paranoid in thinking.  Thought Content: The patient denies any auditory or visual hallucinations today. He also denies any delusional thinking today but remains guarded and paranoid.  He also reported that he was concerned that staff was keeping all of the units energy to themselves and not sharing it with patients. Perception:. paranoid in thinking.  Judgment: Fair.  Insight: Fair.  Cognition: Oriented to person, place and time.  Sleep:  Number of Hours: 5.25    Vital Signs:Blood pressure 99/72, pulse 136, temperature 98.5 F (36.9 C), temperature source Oral, resp. rate 20, height 5\' 9"  (1.753 m), weight 95.709 kg (211 lb).  Current Medications: Current Facility-Administered Medications  Medication Dose Route Frequency Provider Last Rate Last Dose  . alum & mag hydroxide-simeth (MAALOX/MYLANTA) 200-200-20 MG/5ML suspension 30 mL  30 mL Oral Q4H PRN Viviann Spare, NP      . divalproex (DEPAKOTE ER) 24 hr tablet 1,500 mg  1,500 mg Oral QHS Curlene Labrum Luwana Butrick, MD   1,500 mg at 01/21/12 2136  . hydrOXYzine (ATARAX/VISTARIL) tablet 25 mg  25 mg Oral TID PRN Sanjuana Kava, NP      . ibuprofen (ADVIL,MOTRIN) tablet 600 mg  600 mg Oral Q8H PRN Viviann Spare, NP      . LORazepam (ATIVAN) tablet 1 mg  1 mg Oral Q8H PRN Viviann Spare, NP      . magnesium hydroxide (MILK OF MAGNESIA) suspension 30 mL  30 mL Oral Daily PRN Viviann Spare, NP      . ondansetron (ZOFRAN) tablet 4 mg  4 mg Oral Q8H PRN Viviann Spare, NP      . QUEtiapine (SEROQUEL XR) 24 hr tablet 800 mg  800 mg Oral QHS Curlene Labrum Mihailo Sage, MD   800 mg at 01/21/12 2137  . sertraline (ZOLOFT) tablet 100 mg  100 mg Oral Q breakfast Curlene Labrum Robertha Staples, MD   100 mg at 01/22/12 1610   Lab Results:  Results for orders placed during the hospital encounter of 01/18/12 (from the past 48 hour(s))  TSH     Status: Normal   Collection Time   01/21/12  7:39 PM      Component Value Range Comment   TSH 2.638  0.350 - 4.500 (uIU/mL)   T3, FREE     Status: Normal   Collection Time   01/21/12  7:39 PM      Component Value Range Comment   T3, Free  2.9  2.3 - 4.2 (pg/mL)   T4, FREE     Status: Normal   Collection Time   01/21/12  7:39 PM      Component Value Range Comment   Free T4 0.96  0.80 - 1.80 (ng/dL)   VALPROIC ACID LEVEL     Status: Normal   Collection Time   01/21/12  7:39 PM      Component Value Range Comment   Valproic Acid Lvl 77.6  50.0 - 100.0 (ug/mL)    Time was spent today discussing with the patient his current symptoms.  The patient states that he is concerned that staff is "keeping all of the energy" in the Nurses station and will not allow him to come near the Nurses station to receive energy.  He stated that he feels staff is keeping all of the energy to themselves.  It was discussed with the patient that he could come near the Nurses station at anytime other than "quiet time" and  he was appreciative of this.    Treatment Plan Summary:  1. Daily contact with patient to assess and evaluate symptoms and progress in treatment  2. Medication management  3. The patient will deny suicidal ideations or homicidal ideations for 48 hours prior to discharge and have a depression and anxiety rating of 3 or less. The patient will also deny any auditory or visual hallucinations or delusional thinking.   Plan:  1. Will continue current medications.  2. Laboratory studies reviewed.  3. Will continue to monitor.  4. No medication changes made today since his antipsychotic medication was doubled last night.  Aquan Kope 01/22/2012, 2:39 PM

## 2012-01-23 NOTE — Progress Notes (Signed)
[facility] Group Notes:  (Counselor/Nursing/MHT/Case Management/Adjunct)  01/23/2012 9:09 AM  Type of Therapy:  Group Therapy 01/22/12  Participation Level:  Did Not Attend   Albert [name] 01/23/2012, 9:09 AM

## 2012-01-23 NOTE — Progress Notes (Signed)
Currently resting quietly in bed in right lateral position with eyes closed. Respirations are even and unlabored. No acute pain/discomfort/ditress noted. Safety has been maintained with Q15 minute observation. Will continue current POC.

## 2012-01-23 NOTE — Progress Notes (Signed)
[facility] MD Progress Note  01/23/2012 1:00 PM  Diagnosis:  Axis I: Schizoaffective Disorder - Bipolar Type.   The patient was seen today and reports the following:   ADL's: Intact.  Sleep: The patient reports to sleeping well last night.  Appetite: The patient reports a good appetite today.   Mild>(1-10) >Severe  Hopelessness (1-10): 0  Depression (1-10): 0  Anxiety (1-10): 2   Suicidal Ideation: The patient adamantly denies any suicidal ideation today.  Plan: No  Intent: No  Means: No   Homicidal Ideation: The patient adamantly denies any homicidal ideations today.  Plan: No  Intent: No.  Means: No   General Appearance/Behavior: The patient remains appropriate and cooperative today.  Eye Contact: Good.  Speech: Appropriate in rate and volume today with no pressuring noted.  Motor Behavior: wnl.  Level of Consciousness: Alert and Oriented x 3.  Mental Status: Alert and Oriented x 3.  Mood: Essentially Euthymic.  Affect: Slightly Constricted.  Anxiety Level: Mild anxiety reported.  Thought Process: Paranoid in his thinking.  Thought Content: The patient denies any auditory or visual hallucinations today. He also denies any delusional thinking today but remains paranoid in his thinking.  He also reported that he is concerned about various staff and unit issues today.  He asked to discuss these with the unit Director. Perception:. paranoid in thinking.  Judgment: Fair.  Insight: Fair.  Cognition: Oriented to person, place and time.  Sleep:  Number of Hours: 5.25    Vital Signs:Blood pressure 98/68, pulse 106, temperature 97.7 F (36.5 C), temperature source Oral, resp. rate 16, height 5\' 9"  (1.753 m), weight 95.709 kg (211 lb).  Current Medications: Current Facility-Administered Medications  Medication Dose Route Frequency Provider Last Rate Last Dose  . alum & mag hydroxide-simeth (MAALOX/MYLANTA) 200-200-20 MG/5ML suspension 30 mL  30 mL Oral Q4H PRN Viviann Spare, NP       . divalproex (DEPAKOTE ER) 24 hr tablet 1,500 mg  1,500 mg Oral QHS Curlene Labrum Khale Nigh, MD   1,500 mg at 01/22/12 2128  . hydrOXYzine (ATARAX/VISTARIL) tablet 25 mg  25 mg Oral TID PRN Sanjuana Kava, NP      . ibuprofen (ADVIL,MOTRIN) tablet 600 mg  600 mg Oral Q8H PRN Viviann Spare, NP      . LORazepam (ATIVAN) tablet 1 mg  1 mg Oral Q8H PRN Viviann Spare, NP      . magnesium hydroxide (MILK OF MAGNESIA) suspension 30 mL  30 mL Oral Daily PRN Viviann Spare, NP      . ondansetron (ZOFRAN) tablet 4 mg  4 mg Oral Q8H PRN Viviann Spare, NP      . QUEtiapine (SEROQUEL XR) 24 hr tablet 800 mg  800 mg Oral QHS Curlene Labrum Marites Nath, MD   800 mg at 01/22/12 2129  . sertraline (ZOLOFT) tablet 100 mg  100 mg Oral Q breakfast Curlene Labrum Karely Hurtado, MD   100 mg at 01/23/12 9147   Lab Results:  Results for orders placed during the hospital encounter of 01/18/12 (from the past 48 hour(s))  TSH     Status: Normal   Collection Time   01/21/12  7:39 PM      Component Value Range Comment   TSH 2.638  0.350 - 4.500 (uIU/mL)   T3, FREE     Status: Normal   Collection Time   01/21/12  7:39 PM      Component Value Range Comment   T3, Free  2.9  2.3 - 4.2 (pg/mL)   T4, FREE     Status: Normal   Collection Time   01/21/12  7:39 PM      Component Value Range Comment   Free T4 0.96  0.80 - 1.80 (ng/dL)   VALPROIC ACID LEVEL     Status: Normal   Collection Time   01/21/12  7:39 PM      Component Value Range Comment   Valproic Acid Lvl 77.6  50.0 - 100.0 (ug/mL)    Time was spent today discussing with the patient his current symptoms. The patient states that he is sleeping well and reports a good appetite.  He denies suicidal or homicidal ideations as well as any auditory or visual hallucinations.  He does continue to display some paranoid thinking as well as multiple concerns about staff and the unit.  He states that staff does not respond quickly enough to his needs and he feels that staff other communicate  with secret hand movement.  He also voiced concerns about a staff person who did not have her id badge in clear view.  He asked to discuss these concerns with the Unit Director and this will be requested.  Treatment Plan Summary:  1. Daily contact with patient to assess and evaluate symptoms and progress in treatment  2. Medication management  3. The patient will deny suicidal ideations or homicidal ideations for 48 hours prior to discharge and have a depression and anxiety rating of 3 or less. The patient will also deny any auditory or visual hallucinations or delusional thinking.   Plan:  1. Will continue current medications.  2. Laboratory studies reviewed.  3. Will continue to monitor.  4. No medication changes made today since his antipsychotic medication was doubled 2 days ago and has not had time to show it's full affect.  Albert [name] 01/23/2012, 1:00 PM

## 2012-01-23 NOTE — Progress Notes (Signed)
01/23/2012         Time: 0930      Group Topic/Focus: The focus of this group is on discussing various aspects of wellness, balancing those aspects and exploring ways to increase the ability to experience wellness.  Participation Level: None  Participation Quality: Attentive  Affect: Blunted  Cognitive: Alert   Additional Comments: Patient briefly in group, sat silently the entire time, then walked back out and did not return.   Loveta Dellis 01/23/2012 1:18 PM

## 2012-01-23 NOTE — Progress Notes (Signed)
Currently resting quietly in bed in right lateral position with eyes closed. Respirations are even and unlabored. No acute distress noted. Safety has been maintained with Q15 minute observation. Will continue current POC.  

## 2012-01-23 NOTE — Progress Notes (Signed)
Pt irritable and demanding this morning requesting snacks after returning from breakfast in cafeteria. Per report pt ate 2 plates of breakfast. Pt irritable and demanding when writer explained snack times to pt. Pt reports that others in cafeteria were placing energy on him and he was forced to spit out his bread. Pt has a fixed intense stare when interacting with others. He denies si, hi and voices. Pt continues to be guarded and suspicious.

## 2012-01-23 NOTE — Progress Notes (Addendum)
Patient in day room during this assessment watching TV. His mood and affect blunted and guarded. He denied SI/HI and denied hallucinations. Patient reported having a good day. Not much interactions noted between this patient and peers. His behavior is appropriate to situation and he answered most of his questions with yes or no . Q 15 minute check continues as ordered to maintain safety.   2235: After patient received his HS medications, he came back to the window and asked if he could speak with Minerva Areola, Charity fundraiser. Pt asked him why he was cracking his knuckles earlier; "were you trying to send me signals, I don't want you to think I'm paranoid or anything, everything has meaning, I thought you were given me signal to get up and live or sending some signals to me". Staff nurse explained to patient that he wasn't sending any signal and encouraged patient not to pay too much attention to things happening around him and that not every thing that happens around him is about him. We commended patient for clarifying his concern with staff. Pt seemed satisfied with the explanation.

## 2012-01-23 NOTE — Discharge Planning (Addendum)
Met with patient in Aftercare Planning Group.   Just prior to group, he expressed aloud that he did not need to stay for this group, and Case Manager told him that he has not been to the Aftercare Planning Group yet so that he did need to stay.  He became offended and said he had complaints about various staff who have spoken with an "edge" to their voice that was not necessary.  He talked about several instances and Case Manager provided support about his right to talk to Director about his concerns.  He said he was glad Case Manager changed the way she spoke to him.  Although he acted suspicious about Case Manager's inquiries, patient was able to verbalize during group that he has not yet decided whether to see the doctor at [facility], so it is okay to schedule a doctor appointment with Dr. Betti Cruz at [facility].  He was not desirous that Case Manager should set appointments at all, stating he was going to do it, but acquiesced after Case Manager explained that it is required by her job description to do this.  During group, patient demanded to know from the gentleman sitting next to him what he was doing.  That person had been putting on his watch.  Patient told him that he should not do that, it was disrespectful not to pay attention.  He spoke at some length about more complaints re staff not providing "superior customer service."  In the process of his speech, he used an offensive word.  Other patients showed visible reaction to the word, and Case Manager stopped patient to redirect him.  This caused him to start arguing about his right to use the language he chooses.  When Case Manager went over the rules, he became argumentative, and Case Manager eventually insisted that he leave the room.  He was quite angry at this, and it was necessary for CM to ask the MHT for assistance to get him to go back to his room. He demanded to know CM's name and said he would report her to superiors.  Ambrose Mantle, LCSW 01/23/2012, 1:59 PM   Spoke with patient at his request in a lengthy 1:1 session.  He is very concrete, wanted to explain himself better.  Case Manager provided supportive listening and apologized for misunderstanding earlier.  He had lengthy description of bizarre sequences of events which he felt were pivotal moments for him understanding himself.  Again, simply acted as an Personal assistant.  Ambrose Mantle, LCSW 01/23/2012, 4:06 PM

## 2012-01-24 NOTE — Progress Notes (Signed)
[facility] Group Notes:  (Counselor/Nursing/MHT/Case Management/Adjunct)  01/24/2012 2:15 PM  Type of Therapy:  Psychoeducational Skills 01/23/12  Participation Level:  None  Participation Quality:  Inattentive  Affect:  Blunted  Cognitive:  Delusional  Insight:  None  Engagement in Group:  None  Engagement in Therapy:  None  Modes of Intervention:  Education  Summary of Progress/Problems: Patient was physically present but he did not appear to be listening or engaged in group. Appeared guarded and paranoid when speaker introduced himself.   Helton Oleson, Aram Beecham 01/24/2012, 2:15 PM

## 2012-01-24 NOTE — Discharge Planning (Signed)
Met with patient in Aftercare Planning Group.   He expressed no particular case management needs today.  Per State Regulation 482.30  This chart was reviewed for medical necessity with respect to the patient's Admission/Duration of stay.   Next review due:  01/27/12   Ambrose Mantle, LCSW  01/24/2012  11:18 AM

## 2012-01-24 NOTE — Progress Notes (Signed)
Patient has been compliant with medications today.  Has stayed in his room much of the day, but has been cooperative when up and in the milieu.  Denies pain or suicidal ideation at this time.  A bit irritable at times, but does not direct anger at anyone in particular.

## 2012-01-24 NOTE — Progress Notes (Signed)
[facility] Group Notes:  (Counselor/Nursing/MHT/Case Management/Adjunct)  01/24/2012 2:17 PM  Type of Therapy:  Group Therapy  Participation Level:  Did Not Attend  Albert [name] 01/24/2012, 2:17 PM
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
## 2012-01-25 NOTE — Discharge Planning (Signed)
Met with patient in Aftercare Planning Group.   He feels that he is close to being ready to leave hospital, states that there is no reason for continued stay much longer.  He states he is on the same medications as prior to hospitalization and feels he is safe and able to take care of himself.  The reason for his decompensation, he says repeatedly, was his fast and he is no longer doing that.  He does not feel that he will fast in the future in order to avoid a recurrence.    Patient's follow-up with his counselor Gara Kroner at [facility] was set for 01/30/12 at 4PM, and Case Manager left message at [facility] requesting a follow-up appointment with Dr. Betti Cruz.  Awaiting call back.  Ambrose Mantle, LCSW 01/25/2012, 9:43 AM

## 2012-01-25 NOTE — Progress Notes (Signed)
Patient ID: Albert [name], male   DOB: 07-Jan-1976, 36 y.o.   MRN: [id number] Pt. Awake, NAD.  Appropriately groomed and dressed.  Alert, almost hypervigilant.  Patient reports having conversations with people without speaking to them, reports being good friends with famous people.  He denies SI/HI/AVH but he is thought blocking and reporting the above hallucinations.

## 2012-01-25 NOTE — Progress Notes (Signed)
[facility] MD Progress Note  01/25/2012 5:16 PM  Diagnosis:  Axis I: Schizoaffective Disorder - Bipolar Type.   The patient was seen today and reports the following:   ADL's: Intact.  Sleep: The patient reports to continuing to sleep well last night.  Appetite: The patient reports a good appetite today.   Mild>(1-10) >Severe  Hopelessness (1-10): 0  Depression (1-10): 0  Anxiety (1-10): 0   Suicidal Ideation: The patient adamantly denies any suicidal ideation today.  Plan: No  Intent: No  Means: No   Homicidal Ideation: The patient adamantly denies any homicidal ideations today.  Plan: No  Intent: No.  Means: No   General Appearance/Behavior: The patient remains appropriate and cooperative today.  Eye Contact: Good.  Speech: Appropriate in rate and volume today with no pressuring noted.  Motor Behavior: wnl.  Level of Consciousness: Alert and Oriented x 3.  Mental Status: Alert and Oriented x 3.  Mood: Essentially Euthymic.  Affect: Bright and Full today.  Anxiety Level: No anxiety reported.  Thought Process: wnl.  Thought Content: The patient denies any auditory or visual hallucinations today. He also denies any delusional thinking today.   Perception:. wnl.  Judgment: Fair to Good.  Insight: Fair to Good.  Cognition: Oriented to person, place and time.  Sleep:  Number of Hours: 6.75    Vital Signs:Blood pressure 102/72, pulse 99, temperature 96.8 F (36 C), temperature source Oral, resp. rate 16, height 5\' 9"  (1.753 m), weight 95.709 kg (211 lb).  Current Medications: Current Facility-Administered Medications  Medication Dose Route Frequency Provider Last Rate Last Dose  . alum & mag hydroxide-simeth (MAALOX/MYLANTA) 200-200-20 MG/5ML suspension 30 mL  30 mL Oral Q4H PRN Viviann Spare, NP      . divalproex (DEPAKOTE ER) 24 hr tablet 1,500 mg  1,500 mg Oral QHS Curlene Labrum Jazari Ober, MD   1,500 mg at 01/24/12 2130  . hydrOXYzine (ATARAX/VISTARIL) tablet 25 mg  25 mg Oral  TID PRN Sanjuana Kava, NP      . ibuprofen (ADVIL,MOTRIN) tablet 600 mg  600 mg Oral Q8H PRN Viviann Spare, NP      . LORazepam (ATIVAN) tablet 1 mg  1 mg Oral Q8H PRN Viviann Spare, NP      . magnesium hydroxide (MILK OF MAGNESIA) suspension 30 mL  30 mL Oral Daily PRN Viviann Spare, NP      . ondansetron (ZOFRAN) tablet 4 mg  4 mg Oral Q8H PRN Viviann Spare, NP      . QUEtiapine (SEROQUEL XR) 24 hr tablet 800 mg  800 mg Oral QHS Curlene Labrum Erich Kochan, MD   800 mg at 01/24/12 2130  . sertraline (ZOLOFT) tablet 100 mg  100 mg Oral Q breakfast Curlene Labrum Nakeshia Waldeck, MD   100 mg at 01/25/12 9562   Lab Results: No results found for this or any previous visit (from the past 48 hour(s)).  Time was spent today discussing with the patient his current symptoms. The patient states that he is sleeping well with a good appetite.  He denies any suicidal or homicidal ideations.  He states today that he is not having any significant paranoid thinking and feels that staff is treating him very well with no complaints today.  It was discussed with the patient the possibility of discharge Monday and the patient agreed.  It was also discussed with the patient that should he choose to be discharged over the weekend that this could occur if  the on call Psychiatrist feels he is stable for discharge.  Treatment Plan Summary:  1. Daily contact with patient to assess and evaluate symptoms and progress in treatment  2. Medication management  3. The patient will deny suicidal ideations or homicidal ideations for 48 hours prior to discharge and have a depression and anxiety rating of 3 or less. The patient will also deny any auditory or visual hallucinations or delusional thinking.   Plan:  1. Will continue current medications.  2. Laboratory studies reviewed.  3. Will continue to monitor.  4. The patient may be discharged this weekend if the patient request discharge.  If not, most likely will be discharged home on  Monday.  Fady Stamps 01/25/2012, 5:16 PM

## 2012-01-25 NOTE — Progress Notes (Signed)
Currently resting quietly in bed in right lateral position with eyes closed. Respirations are even and unlabored. No acute distress noted. Safety has been maintained with Q15 minute observation. Will continue current POC.  

## 2012-01-25 NOTE — Progress Notes (Signed)
[facility] Group Notes:  (Counselor/Nursing/MHT/Case Management/Adjunct)  01/25/2012 10:09 AM  Type of Therapy:  Group Therapy  Participation Level:  Did Not Attend   Veto Kemps 01/25/2012, 10:09 AM

## 2012-01-25 NOTE — Progress Notes (Signed)
Pt did not attend the groups today. Is withdrawn and stays to himself much of the time. Pt has been eating in the cafeteria today and has not talked about people stealing his energy. Given appropriate support with short 1:1's. Denies SI and HI. Did not fill out his inventory today.

## 2012-01-25 NOTE — Tx Team (Signed)
Interdisciplinary Treatment Plan Update (Adult)  Date:  01/25/2012  Time Reviewed:  10:15AM-11:00AM  Progress in Treatment: Attending groups:  Yes Participating in groups:  Yes   Taking medication as prescribed:    Yes Tolerating medication: Yes   Family/Significant other contact made:  Yes Patient understands diagnosis:   Yes Discussing patient identified problems/goals with staff:   Yes Medical problems stabilized or resolved:   Yes Denies suicidal/homicidal ideation:   Issues/concerns per patient self-inventory:   None Other:    New problem(s) identified: No, Describe:    Reason for Continuation of Hospitalization: Delusions  Other; describe bizarre, paranoid behavior at times  Interventions implemented related to continuation of hospitalization:  Medication monitoring and adjustment, safety checks Q15 min., suicide risk assessment, group therapy, psychoeducation, collateral contact, aftercare planning, ongoing physician assessments, medication education  Additional comments:  Not applicable  Estimated length of stay:  2-3 days  Discharge Plan:  Return to his apartment alone and follow up with either [facility] (for counseling) and [facility]/Dr. Betti Cruz (for med mgmt)   New goal(s):  Not applicable  Review of initial/current patient goals per problem list:   1.  Goal(s):  Reduce auditory/visual hallucinations to baseline.  Met:  Yes  Target date:  By Discharge   As evidenced by:  Appears to be at baseline.  2.  Goal(s):  Reduce paranoia to baseline.  Met:  Yes  Target date:  By Discharge   As evidenced by:  Appears to no longer have intense paranoia  3.  Goal(s):  Medication stabilization.  Met:  Yes  Target date:  By Discharge   As evidenced by:  On same meds as prior to hospitalization (when he had stopped meds), working  4.  Goal(s):  Achieved last Tx Tm  Met:  Yes  Target date:  By Discharge   As evidenced by:     Attendees: Patient:  Did not attend   Family:     Physician:  Dr. Harvie Heck Readling 01/25/2012 10:15AM-11:00AM  Nursing:   Lamount Cranker, RN 01/25/2012 10:15AM -11:00AM   Case Manager:  Ambrose Mantle, LCSW 01/25/2012 10:15AM-11:00AM  Counselor:  Veto Kemps, MT-BC 01/25/2012 10:15AM-11:00AM  Other:   Lynann Bologna, NP 01/25/2012 10:15AM-11:00AM  Other:      Other:      Other:       Scribe for Treatment Team:   Sarina Ser, 01/25/2012, 10:15AM-11:15AM

## 2012-01-26 NOTE — Progress Notes (Signed)
Pt has been isolatuive to his room   He is cooperative with treatment and takes his medications without difficulty   He is calm and cooperative   Verbal support given  Medications administered and effectiveness monitored   Q 15 min checks  Pt safe at present

## 2012-01-26 NOTE — Progress Notes (Signed)
Patient ID: Albert [name], male   DOB: Oct 16, 1976, 36 y.o.   MRN: [id number]  [facility] Group Notes:  (Counselor/Nursing/MHT/Case Management/Adjunct)  01/26/2012 11 AM  Type of Therapy:  Group Therapy, Dance/Movement Therapy   Participation Level:  Did Not Attend     Rhunette Croft

## 2012-01-26 NOTE — Progress Notes (Signed)
Patient ID: Albert [name], male   DOB: [birth year], 36 y.o.   MRN: [id number] [facility] MD Progress Note  01/26/2012 8:35 PM  Diagnosis:  Axis I: Schizoaffective Disorder - Bipolar Type.   The patient was seen today and reports the following:   ADL's: Intact.  Sleep: The patient reports to continuing to sleep well last night.  Appetite: The patient reports a good appetite today.   Mild>(1-10) >Severe  Hopelessness (1-10): 0  Depression (1-10): 0  Anxiety (1-10): 0   Suicidal Ideation: The patient adamantly denies any suicidal ideation today.  Plan: No  Intent: No  Means: No   Homicidal Ideation: The patient adamantly denies any homicidal ideations today.  Plan: No  Intent: No.  Means: No   General Appearance/Behavior: The patient remains appropriate and cooperative today.  Eye Contact: Good.  Speech: Appropriate in rate and volume today with no pressuring noted.  Motor Behavior: wnl.  Level of Consciousness: Alert and Oriented x 3.  Mental Status: Alert and Oriented x 3.  Mood:  Euthymic.  Affect: Bright and Full today.  Anxiety Level: No anxiety reported.  Thought Process: disorganized at times Thought Content: The patient denies any auditory or visual hallucinations today. He also denies any delusional thinking today.   Perception:. wnl.  Judgment: Fair to Good.  Insight: Fair to Good.  Cognition: Oriented to person, place and time.  Sleep:  Number of Hours: 4.75    Vital Signs:Blood pressure 86/55, pulse 102, temperature 97.6 F (36.4 C), temperature source Oral, resp. rate 20, height 5\' 9"  (1.753 m), weight 95.709 kg (211 lb).  Current Medications: Current Facility-Administered Medications  Medication Dose Route Frequency Provider Last Rate Last Dose  . alum & mag hydroxide-simeth (MAALOX/MYLANTA) 200-200-20 MG/5ML suspension 30 mL  30 mL Oral Q4H PRN Viviann Spare, NP      . divalproex (DEPAKOTE ER) 24 hr tablet 1,500 mg  1,500 mg Oral QHS Curlene Labrum Readling, MD    1,500 mg at 01/25/12 2204  . hydrOXYzine (ATARAX/VISTARIL) tablet 25 mg  25 mg Oral TID PRN Sanjuana Kava, NP      . ibuprofen (ADVIL,MOTRIN) tablet 600 mg  600 mg Oral Q8H PRN Viviann Spare, NP      . LORazepam (ATIVAN) tablet 1 mg  1 mg Oral Q8H PRN Viviann Spare, NP      . magnesium hydroxide (MILK OF MAGNESIA) suspension 30 mL  30 mL Oral Daily PRN Viviann Spare, NP      . ondansetron (ZOFRAN) tablet 4 mg  4 mg Oral Q8H PRN Viviann Spare, NP      . QUEtiapine (SEROQUEL XR) 24 hr tablet 800 mg  800 mg Oral QHS Curlene Labrum Readling, MD   800 mg at 01/25/12 2203  . sertraline (ZOLOFT) tablet 100 mg  100 mg Oral Q breakfast Curlene Labrum Readling, MD   100 mg at 01/26/12 4098   Lab Results: No results found for this or any previous visit (from the past 48 hour(s)).  Thinks meds are helping now with his mood and he is getting better overall. The patient states that he is sleeping well with a good appetite.  He denies any suicidal or homicidal ideations.  Per pt he is not having any significant paranoid thinking. No new acute problems.    Plan:   1. Will continue current medications.    Wonda Cerise 01/26/2012, 8:35 PM

## 2012-01-27 NOTE — Progress Notes (Signed)
Patient has been in the dayroom watching tv sitting beside his roommate talking to him briefly off and on. Patient currently denies si/hi/a/v hall and having no pain. Patient appears still slightly paranoid but is very pleasant and polite in conversation. Patient informed of his hs medications and that there are no new changes and he is agreeable to taking them. Safety maintained on unit, will continue to monitor.

## 2012-01-27 NOTE — Progress Notes (Signed)
Patient ID: Albert [name], male   DOB: [birth year], 36 y.o.   MRN: [id number] [facility] MD Progress Note  01/27/2012 11:34 AM  Diagnosis:  Axis I: Schizoaffective Disorder - Bipolar Type.   The patient was seen today and reports the following:   ADL's: Intact.  Sleep: The patient reports to continuing to sleep well last night.  Appetite: The patient reports a good appetite today.   Mild>(1-10) >Severe  Hopelessness (1-10): 0  Depression (1-10): 0  Anxiety (1-10): 0   Suicidal Ideation: The patient adamantly denies any suicidal ideation today.  Plan: No  Intent: No  Means: No   Homicidal Ideation: The patient adamantly denies any homicidal ideations today.  Plan: No  Intent: No.  Means: No   General Appearance/Behavior: The patient remains appropriate and cooperative today.  Eye Contact: Good.  Speech: Appropriate in rate and volume today with no pressuring noted.  Motor Behavior: wnl.  Level of Consciousness: Alert and Oriented x 3.  Mental Status: Alert and Oriented x 3.  Mood:  Euthymic.  Affect: Bright and Full today.  Anxiety Level: No anxiety reported.  Thought Process: disorganized at times Thought Content: The patient denies any auditory or visual hallucinations today. He also denies any delusional thinking today.   Perception:. wnl.  Judgment: Fair to Good.  Insight: Fair to Good.  Cognition: Oriented to person, place and time.  Sleep:  Number of Hours: 5.5    Vital Signs:Blood pressure 93/64, pulse 103, temperature 97.6 F (36.4 C), temperature source Oral, resp. rate 20, height 5\' 9"  (1.753 m), weight 95.709 kg (211 lb).  Current Medications: Current Facility-Administered Medications  Medication Dose Route Frequency Provider Last Rate Last Dose  . alum & mag hydroxide-simeth (MAALOX/MYLANTA) 200-200-20 MG/5ML suspension 30 mL  30 mL Oral Q4H PRN Viviann Spare, NP      . divalproex (DEPAKOTE ER) 24 hr tablet 1,500 mg  1,500 mg Oral QHS Curlene Labrum Readling, MD    1,500 mg at 01/26/12 2118  . hydrOXYzine (ATARAX/VISTARIL) tablet 25 mg  25 mg Oral TID PRN Sanjuana Kava, NP      . ibuprofen (ADVIL,MOTRIN) tablet 600 mg  600 mg Oral Q8H PRN Viviann Spare, NP      . LORazepam (ATIVAN) tablet 1 mg  1 mg Oral Q8H PRN Viviann Spare, NP      . magnesium hydroxide (MILK OF MAGNESIA) suspension 30 mL  30 mL Oral Daily PRN Viviann Spare, NP      . ondansetron (ZOFRAN) tablet 4 mg  4 mg Oral Q8H PRN Viviann Spare, NP      . QUEtiapine (SEROQUEL XR) 24 hr tablet 800 mg  800 mg Oral QHS Curlene Labrum Readling, MD   800 mg at 01/26/12 2119  . sertraline (ZOLOFT) tablet 100 mg  100 mg Oral Q breakfast Curlene Labrum Readling, MD   100 mg at 01/27/12 0757   Lab Results: No results found for this or any previous visit (from the past 48 hour(s)).  Was sleeping in his room. Not willing to talk much today. The patient states that he is sleeping well with a good appetite.  He denies any suicidal or homicidal ideations.  Per pt he is not having any significant paranoid thinking. No new acute problems.    Plan:   1. Will continue current medications.    Wonda Cerise 01/27/2012, 11:34 AM

## 2012-01-27 NOTE — Progress Notes (Signed)
Pt is pleasant and appropriate this morning  He takes his medications and is compliant with treatment   His thoughts are logical and coherent   He denies suicidal and homicidal ideation   He denies voices   His interactions are appropriate   He discussed discharge plans with staff and reports he is ready for discharge   Verbal support given  Medications administered and effectiveness monitored  Q 15 min checks  Pt safe at present

## 2012-01-27 NOTE — Progress Notes (Signed)
[facility] Group Notes:  (Counselor/Nursing/MHT/Case Management/Adjunct)  01/27/2012 11 AM  Type of Therapy:  Group Therapy, Dance/Movement Therapy   Participation Level:  Did Not Attend   Albert [name]  

## 2012-01-28 MED ORDER — DIVALPROEX SODIUM ER 500 MG PO TB24: 1500.0000 mg | ORAL_TABLET | Freq: Every day | ORAL | Status: DC

## 2012-01-28 MED ORDER — SERTRALINE HCL 100 MG PO TABS: 100.0000 mg | ORAL_TABLET | Freq: Every day | ORAL | Status: DC

## 2012-01-28 MED ORDER — HYDROXYZINE HCL 25 MG PO TABS: 25.0000 mg | ORAL_TABLET | Freq: Three times a day (TID) | ORAL | Status: AC | PRN

## 2012-01-28 MED ORDER — QUETIAPINE FUMARATE ER 400 MG PO TB24: 800.0000 mg | ORAL_TABLET | Freq: Every day | ORAL | Status: DC

## 2012-01-28 NOTE — Progress Notes (Signed)
[facility] Group Notes:  (Counselor/Nursing/MHT/Case Management/Adjunct)  01/28/2012 2:13 PM  Type of Therapy:  Group Therapy  Participation Level:  Minimal  Participation Quality:  Attentive  Affect:  Depressed  Cognitive:  Delusional  Insight:  poor  Engagement in Group:  Limited  Engagement in Therapy:  Limited  Modes of Intervention:  Clarification, Education, Limit-setting, Orientation, Problem-solving, Socialization and Support  Summary of Progress/Problems:Summary of Progress/Problems: Pt participated in group by listening attentively and openly disclosing. Therapist prompted Pt to state 2 things he liked about herself.  Therapist asked Pt to identify one thing he would like to change and what he could do to make that change.  Pt reported that he articulates well and journals past, present, and future events.  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  He stated he was trying purpose planning according to [facility].  Pt was looking upward some of the time.  Therapist emphasized the importance of engaging in positive activities in order to maintain a balanced lifestyle.  Minimal progress noted.  Intervention effective.      Marni Griffon C 01/28/2012, 2:13 PM

## 2012-01-28 NOTE — Discharge Planning (Signed)
Met with patient in Aftercare Planning Group.   Expressed readiness for discharge.  Will have mother to pick him up and transport home.  States he has benefitted from this hospitalization, and feels better.  Told him about follow-up appointment set with [facility].  Case Manager called and set follow-up with Dr. Betti Cruz at [facility] for 02/19/12 at 3:45PM.  No other case management needs expressed prior to discharge.  Ambrose Mantle, LCSW 01/28/2012, 9:54 AM

## 2012-01-28 NOTE — Tx Team (Signed)
Interdisciplinary Treatment Plan Update (Adult)  Date:  01/28/2012  Time Reviewed:  10:15AM-11:00AM  Progress in Treatment: Attending groups:  Yes Participating in groups:  Yes   Taking medication as prescribed:    Yes Tolerating medication:   Yes Family/Significant other contact made:  Yes Patient understands diagnosis:   Yes Discussing patient identified problems/goals with staff:   Yes Medical problems stabilized or resolved:   Yes Denies suicidal/homicidal ideation:  Yes Issues/concerns per patient self-inventory:   None Other:    New problem(s) identified: No, Describe:    Reason for Continuation of Hospitalization: None  Interventions implemented related to continuation of hospitalization:  Medication monitoring and adjustment, safety checks Q15 min., suicide risk assessment, group therapy, psychoeducation, collateral contact, aftercare planning, ongoing physician assessments, medication education - until discharge  Additional comments:  Patient's previous bizarre behavior is notably absent now  Estimated length of stay:  Discharge today  Discharge Plan:  Return to apartment, follow up with [facility] & [facility]  New goal(s):  Not applicable  Review of initial/current patient goals per problem list:   1.  Goal(s):  Reduce auditory/visual hallucinations to baseline.  Met:  Yes  Target date:  By Discharge   As evidenced by:  Previously met  2.  Goal(s):  Reduce paranoia to baseline.  Met:  Yes  Target date:  By Discharge   As evidenced by:  Previously achieved  3.  Goal(s):  Medication stabilization.  Met:  Yes  Target date:  By Discharge   As evidenced by:  Previously achieved    Attendees: Patient:  Albert [name]  01/28/2012 10:15AM-11:00AM  Family:     Physician:  Dr. Harvie Heck Readling 01/28/2012 10:15AM-11:00AM  Nursing:   Nanine Means, RN 01/28/2012 10:15AM -11:00AM   Case Manager:  Ambrose Mantle, LCSW 01/28/2012  10:15AM-11:00AM  Counselor:  Marni Griffon, LCAS 01/28/2012 10:47 AM   Other:  Barrie Folk, RN 01/28/2012 10:47 AM   Other:      Other:      Other:       Scribe for Treatment Team:   Sarina Ser, 01/28/2012, 10:15AM-11:15AM

## 2012-01-28 NOTE — Progress Notes (Signed)
Pt D/C home. Pt rx and follow-ups were discussed and pt verbalized understanding. Pt denies SI/HI. Pt belongings returned.

## 2012-01-28 NOTE — [facility] Suicide Risk Assessment (Signed)
Suicide Risk Assessment  Discharge Assessment     Demographic factors:  Male;Caucasian;Living alone   Current Mental Status Per Nursing Assessment::   On Admission:    At Discharge:  AO x 3  Current Mental Status Per Physician:  Diagnosis:  Axis I: Schizophrenia - Paranoid Type.  The patient was seen today and reports the following:   ADL's: Intact.  Sleep: The patient reports to continuing to sleep well at night.  Appetite: The patient reports a good appetite today.   Mild>(1-10) >Severe  Hopelessness (1-10): 0  Depression (1-10): 0  Anxiety (1-10): 0   Suicidal Ideation: The patient adamantly denies any suicidal ideation today.  Plan: No  Intent: No  Means: No   Homicidal Ideation: The patient adamantly denies any homicidal ideations today.  Plan: No  Intent: No.  Means: No   General Appearance/Behavior: The patient remains appropriate and cooperative today.  Eye Contact: Good.  Speech: Appropriate in rate and volume today with no pressuring noted.  Motor Behavior: wnl.  Level of Consciousness: Alert and Oriented x 3.  Mental Status: Alert and Oriented x 3.  Mood: Essentially Euthymic.  Affect: Bright and Full today.  Anxiety Level: No anxiety reported.  Thought Process: wnl.  Thought Content: The patient denies any auditory or visual hallucinations today. He also denies any delusional thinking today.  Perception:. wnl.  Judgment: Good.  Insight: Good.  Cognition: Oriented to person, place and time.   Loss Factors: No Acute Losses Reported.  Historical Factors: Family history of mental illness or substance abuse  Risk Reduction Factors:   Access to Medical Care.  Supportive Family.  Continued Clinical Symptoms:  Schizophrenia:   Paranoid or undifferentiated type Previous Psychiatric Diagnoses and Treatments  Discharge Diagnoses:   AXIS I:  Schizophrenia - Paranoid Type. AXIS II:  None Noted. AXIS III:  1. Seizure Disorder. AXIS IV:  Chronic  Mental Illness.  History of Non-compliance with medications. AXIS V:  GAF at admission approximately 35.  GAF at time of discharge approximately 50.  Cognitive Features That Contribute To Risk:  Thought constriction (tunnel vision)    Time was spent today discussing with the patient his current symptoms. The patient states that he is continuing to sleep well at night and reports a good appetite. He adamantly denies any suicidal or homicidal ideations. He states today that he is not having any significant paranoid thinking and feels that staff is treating him very well with no complaints today.  The patient states that he is ready for discharge and that his Mother will transport him home.  Treatment Plan Summary:  1. Daily contact with patient to assess and evaluate symptoms and progress in treatment  2. Medication management  3. The patient will deny suicidal ideations or homicidal ideations for 48 hours prior to discharge and have a depression and anxiety rating of 3 or less. The patient will also deny any auditory or visual hallucinations or delusional thinking.   Plan:  1. Will continue current medications.  2. Laboratory studies reviewed.  3. Will continue to monitor.  4. The patient may be discharged today to outpatient follow up at [facility] and with Dr. Betti Cruz.  Suicide Risk:  Minimal: No identifiable suicidal ideation.  Patients presenting with no risk factors but with morbid ruminations; may be classified as minimal risk based on the severity of the depressive symptoms  Plan Of Care/Follow-up recommendations:  Activity:  As Tolerated. Diet:  Regular Diet. Other:  Please take all  medications as prescribed and keep all scheduled follow up appointments.  Albert [name] 01/28/2012, 12:45 PM

## 2012-01-28 NOTE — Progress Notes (Signed)
01/28/2012         Time: 0930      Group Topic/Focus: The focus of this group is on discussing various styles of communication and communicating assertively using 'I' (feeling) statements.  Participation Level: Minimal  Participation Quality: Attentive  Affect: Blunted  Cognitive: Oriented  Additional Comments: Patient reports he will be discharged today. Patient making vague statements when asked how he feels about discharge like, "I don't feel any particular way" and when asked if he could be safe, patient says "just is" but wouldn't clarify.   Luddie Boghosian 01/28/2012 3:40 PM

## 2012-01-28 NOTE — Progress Notes (Signed)
Patient ID: Albert [name], male   DOB: Mar 19, 1976, 36 y.o.   MRN: [id number] Has been brighter this evening and seems in good spirits, has been out in the dayroom watching tv, went to group, denies SI/HI, AVH.  Smiling and pleasant in conversation, tangential, makes good eye contact compliant with meds, and interacting well with select peers and staff.  Will continue to monitor for safety.

## 2012-01-28 NOTE — Progress Notes (Signed)
[facility] Case Management Discharge Plan:  Will you be returning to the same living situation after discharge: Yes,  has apartment At discharge, do you have transportation home?:Yes,  mother will transport Do you have the ability to pay for your medications:Yes,  income and insurance  Interagency Information:     Release of information consent forms completed and in the chart;  Patient's signature needed at discharge.  Patient to Follow up at:  Follow-up Information    Follow up with Gara Kroner, MSW on 01/30/2012. (4:00PM appointment)    Contact information:   [facility] [address] [place]  [address] Telephone:  [phone]      Follow up with Dr. Betti Cruz on 02/19/2012. (3:45PM appointment)    Contact information:   [facility] [address] [place] Telephone:  [phone]         Patient denies SI/HI:   Yes,      Safety Planning and Suicide Prevention discussed:  Yes,  During Aftercare Planning Group, Case Manager provided psychoeducation on "Suicide Prevention Information."  This included descriptions of risk factors for suicide, warning signs that an individual is in crisis and thinking of suicide, and what to do if this occurs.  Pt indicated understanding of information provided, and will read brochure given upon discharge.     Barrier to discharge identified:No.  Summary and Recommendations:  Patient will follow up with his therapist and doctor.   Sarina Ser 01/28/2012, 11:03 AM

## 2012-01-29 NOTE — Progress Notes (Signed)
Patient Discharge Instructions:  Psychiatric Admission Assessment Note Provided,  01/29/2012 After Visit Summary (AVS) Provided,  01/29/2012 Face Sheet Provided, 01/29/2012 Faxed/Sent to the Next Level Care provider:  01/29/2012 Sent Suicide Risk Assessment - Discharge Assessment 01/29/2012  Faxed to [facility] - Dr. Betti Cruz @ [phone] And to [facility] @ [phone]  Wandra Scot, 01/29/2012, 6:28 PM

## 2012-01-30 NOTE — Discharge Summary (Signed)
Physician Discharge Summary Note  Patient:  Albert [name] is an 36 y.o., male MRN:  [id number] DOB:  09/22/1976 Patient phone:  [phone] (home)  Patient address:   [address] [place] [address],   Date of Admission:  01/18/2012 Date of Discharge: 01/28/12  Reason for Admission: Sense of energy following him  Discharge Diagnoses: Principal Problem:  *Schizophrenia, paranoid type   Axis Diagnosis:   AXIS I:  Schizophrenia, paranoia type AXIS II:  Deferred AXIS III:   Past Medical History  Diagnosis Date  . Paranoid schizophrenia   . Drug abuse     xanax addiction 5 years ago  . Alcoholism   . Seizures    AXIS IV:  other psychosocial or environmental problems AXIS V:  65  Level of Care:  OP  Hospital Course:   Was riding his bike and felt an energy following him. Had been fasting for Alwyn Pea and not sleeeping correctly. Called his step -dad who brought him to the ED. Father suspects non-compliance.   While a patient in this hospital, patient was started on medication regimen for his psychotic symptoms. He was also enrolled in group counseling and activities. Patient took his medications and tolerated them well without any significant adverse effects and or reactions. He participated in group counseling as recommended.  Patient showed improved mood and reported improved symptoms on daily basis. He agreed to be his discharged today to his home. He will continue psychiatric care on an outpatient basis with Gara Kroner MSW for counseling on 01/30/12 and with Dr. Betti Cruz for medication management on 02/19/12. The addresses, dates and times for these appointments provided for patient. Patient adamantly denies any suicidal, homicidal ideations, auditory and visual hallucinations. However, he admits not feeling any significant delusional thinking.  He left [facility] with all personal belongings in no apparent distress via family transport.     Consults:   None  Significant Diagnostic Studies:  labs: TSH, T3, T4  Discharge Vitals:   Blood pressure 104/71, pulse 103, temperature 97.9 F (36.6 C), temperature source Oral, resp. rate 18, height 5\' 9"  (1.753 m), weight 95.709 kg (211 lb).  Mental Status Exam: See Mental Status Examination and Suicide Risk Assessment completed by Attending Physician prior to discharge.  Discharge destination:  Home  Is patient on multiple antipsychotic therapies at discharge:  No   Has Patient had three or more failed trials of antipsychotic monotherapy by history:  No  Recommended Plan for Multiple Antipsychotic Therapies: NA   Medication List  As of 01/30/2012  5:28 PM   TAKE these medications      Indication    divalproex 500 MG 24 hr tablet   Commonly known as: DEPAKOTE ER   Take 3 tablets (1,500 mg total) by mouth at bedtime. For mood control       hydrOXYzine 25 MG tablet   Commonly known as: ATARAX/VISTARIL   Take 1 tablet (25 mg total) by mouth 3 (three) times daily as needed for anxiety (sleep). For anxiety       QUEtiapine 400 MG 24 hr tablet   Commonly known as: SEROQUEL XR   Take 2 tablets (800 mg total) by mouth at bedtime. For mood control       sertraline 100 MG tablet   Commonly known as: ZOLOFT   Take 1 tablet (100 mg total) by mouth daily with breakfast. For depression            Follow-up Information    Follow up  with Gara Kroner, MSW on 01/30/2012. (4:00PM appointment)    Contact information:   [facility] [address] [place]  [address] Telephone:  [phone]      Follow up with Dr. Betti Cruz on 02/19/2012. (3:45PM appointment)    Contact information:   [facility] [address] [place] Telephone:  [phone]         Follow-up recommendations:  Other:  Keep all scheduled follow-up appointments as recommended.  Comments:  Take all your medicines as prescribed.                       Report to your  outpatient provider any adverse effects from medications.  SignedArmandina Stammer I 01/30/2012, 5:28 PM

## 2012-02-14 ENCOUNTER — Emergency Department (HOSPITAL_COMMUNITY)
Admission: EM | Admit: 2012-02-14 | Discharge: 2012-02-14 | Disposition: A | Discharge status: Home / Self Care | Payer: Medicare Other | Attending: Emergency Medicine | Admitting: Emergency Medicine

## 2012-02-14 ENCOUNTER — Encounter (HOSPITAL_COMMUNITY): Payer: Self-pay | Admitting: Emergency Medicine

## 2012-02-14 DIAGNOSIS — F200 Paranoid schizophrenia: Secondary | ICD-10-CM | POA: Insufficient documentation

## 2012-02-14 DIAGNOSIS — R6889 Other general symptoms and signs: Secondary | ICD-10-CM | POA: Insufficient documentation

## 2012-02-14 DIAGNOSIS — R0989 Other specified symptoms and signs involving the circulatory and respiratory systems: Secondary | ICD-10-CM

## 2012-02-14 NOTE — ED Notes (Signed)
Pt able to swallow fluids and crackers with out difficulty.

## 2012-02-14 NOTE — ED Notes (Signed)
Pt presented to the ER by PTAR, pt c/o choking and states that after eating hot dogs, there after pt felt like he was choking on the same, pt further reports that he also took "5 pills" and it could not go down. Pt states that he still feels like the pills are stuck but denies feeling of a hot dog in that area. Pt able to verbalize and explain the episode, sat 98% RA

## 2012-02-14 NOTE — ED Notes (Signed)
Pt in the ER states that he choked on the hot dog and that while in the coffee house, listening the music, he remembered that it is time for the medication and took to swallow, however, it was not ableto pass down. Pt rerpots that he still feels the "stock hotdog" and the taste of the meds he took. Pt able to breat with out any difficulty. Note to be spitting in the emesis bag.

## 2012-02-14 NOTE — Discharge Instructions (Signed)
Please follow up with the GI office for recheck.  You may need further evaluation with a endoscope if you have another episode of food stuck.  Return to the ER for worsening condition or new concerning symptoms.  Globus Syndrome Globus Syndrome is a feeling of a lump or a sensation of something caught in your throat. Eating food or drinking fluids does not seem to get rid of it. Yet it is not noticeable during the actual act of swallowing food or liquids. Usually there is nothing physically wrong. It is troublesome because it is an unpleasant sensation which is sometimes difficult to ignore and at times may seem to worsen. The syndrome is quite common. It is estimated 45% of the population experiences features of the condition at some stage during their lives. The symptoms are usually temporary. The largest group of people who feel the need to seek medical treatment is females between the ages of 57 to 21.  CAUSES  Globus Syndrome appears to be triggered by or aggravated by stress, anxiety and depression.  Tension related to stress could product abnormal muscle spasms in the esophagus which would account for the sensation of a lump or ball in your throat.   Frequent swallowing or drying of the throat caused by anxiety or other strong emotions can also produce this uncomfortable sensation in your throat.   Fear and sadness can be expressed by the body in many ways. For instance, if you had a relative with throat cancer you might become overly concerned about your own health and develop uncomfortable sensations in your throat.   The reaction to a crisis or a trauma event in your life can take the form of a lump in your throat. It is as if you are indirectly saying you can not handle or "swallow" one more thing.  DIAGNOSIS  Usually your caregiver will know what is wrong by talking to you and examining you. If the condition persists for several days, more testing may be done to make sure there is not  another problem present. This is usually not the case. TREATMENT   Reassurance is often the best treatment available. Usually the problem leaves without treatment over several days.   Sometimes anti-anxiety medications may be prescribed.   Counseling or talk therapy can also help with strong underlying emotions.   Note that in most cases this is not something that keeps coming back and you should not be concerned or worried.  Document Released: 01/05/2004 Document Revised: 10/04/2011 Document Reviewed: 06/03/2008 [facility] Patient Information 2012 [place], [place].

## 2012-02-19 NOTE — ED Provider Notes (Signed)
The patient was initially placed in fast track room. After nursing assessment, it was determined he needed more acute care for evaluation of esophageal obstruction and was taken from the fast track area to exam room. I did not participate directly in his care.  Rodena Medin, PA-C 02/19/12 1106

## 2012-02-20 NOTE — ED Provider Notes (Signed)
Medical screening examination/treatment/procedure(s) were performed by non-physician practitioner and as supervising physician I was immediately available for consultation/collaboration.  Lochlan Grygiel, MD 02/20/12 1101 

## 2012-05-13 NOTE — ED Provider Notes (Signed)
History     CSN: [id number]  Arrival date & time 02/14/12  2143   First MD Initiated Contact with Patient 02/14/12 2235      Chief Complaint  Patient presents with  . Choking    (Consider location/radiation/quality/duration/timing/severity/associated sxs/prior treatment) HPI 36 yo male presents to the ER with complaint of choking.  Pt reports he was taking all of his evening medications while at local coffee house and also eating a hot dog.  Pt reports he started choking, and feels that he still has either hot dog or pills stuck in his chest.  Pt has been able to eat and drink without vomiting, occasionally feels that he has to spit up saliva.  Pt reports similar sxs once before that resolved on their own.  No other complaints at this time.   Past Medical History  Diagnosis Date  . Paranoid schizophrenia   . Drug abuse     xanax addiction 5 years ago  . Alcoholism   . Seizures     History reviewed. No pertinent past surgical history.  History reviewed. No pertinent family history.  History  Substance Use Topics  . Smoking status: Former Games developer  . Smokeless tobacco: Not on file  . Alcohol Use: No      Review of Systems  All other systems reviewed and are negative.    Allergies  Review of patient's allergies indicates no known allergies.  Home Medications   Current Outpatient Rx  Name Route Sig Dispense Refill  . DIVALPROEX SODIUM ER 500 MG PO TB24 Oral Take 3 tablets (1,500 mg total) by mouth at bedtime. For mood control 90 tablet 0  . QUETIAPINE FUMARATE ER 400 MG PO TB24 Oral Take 2 tablets (800 mg total) by mouth at bedtime. For mood control 60 tablet 0  . SERTRALINE HCL 100 MG PO TABS Oral Take 1 tablet (100 mg total) by mouth daily with breakfast. For depression 30 tablet 0    BP 134/84  Pulse 101  Temp 97.8 F (36.6 C) (Oral)  Resp 18  SpO2 98%  Physical Exam  Nursing note and vitals reviewed. Constitutional: He is oriented to person, place, and  time. He appears well-developed and well-nourished. No distress.  HENT:  Head: Normocephalic and atraumatic.  Nose: Nose normal.  Mouth/Throat: Oropharynx is clear and moist. No oropharyngeal exudate.  Neck: Normal range of motion. Neck supple. No JVD present. No tracheal deviation present. No thyromegaly present.  Cardiovascular: Normal rate, regular rhythm, normal heart sounds and intact distal pulses.  Exam reveals no gallop and no friction rub.   No murmur heard. Pulmonary/Chest: Effort normal. No stridor. No respiratory distress. He has no rales. He exhibits no tenderness.  Abdominal: Soft. Bowel sounds are normal. He exhibits no distension and no mass. There is no tenderness. There is no rebound and no guarding.  Musculoskeletal: Normal range of motion. He exhibits no edema and no tenderness.  Lymphadenopathy:    He has no cervical adenopathy.  Neurological: He is alert and oriented to person, place, and time. He exhibits normal muscle tone. Coordination normal.  Skin: Skin is warm and dry. No rash noted. He is not diaphoretic. No erythema. No pallor.  Psychiatric: He has a normal mood and affect. His behavior is normal. Judgment and thought content normal.    ED Course  Procedures (including critical care time)  Labs Reviewed - No data to display No results found.   1. Globus sensation  MDM  36 yo male with possible food bolus, which has now passed.  Pt has been able to eat/drink here in the ED without vomiting or further spitting.  Will refer to GI for further evaluation as outpatient.        Olivia Mackie, MD 05/13/12 2241

## 2012-08-16 ENCOUNTER — Encounter (HOSPITAL_COMMUNITY): Payer: Self-pay | Admitting: Emergency Medicine

## 2012-08-16 ENCOUNTER — Emergency Department (HOSPITAL_COMMUNITY)
Admission: EM | Admit: 2012-08-16 | Discharge: 2012-08-16 | Disposition: A | Discharge status: Home / Self Care | Payer: Medicare Other | Attending: Emergency Medicine | Admitting: Emergency Medicine

## 2012-08-16 DIAGNOSIS — Z87891 Personal history of nicotine dependence: Secondary | ICD-10-CM | POA: Insufficient documentation

## 2012-08-16 DIAGNOSIS — J029 Acute pharyngitis, unspecified: Secondary | ICD-10-CM | POA: Insufficient documentation

## 2012-08-16 DIAGNOSIS — F200 Paranoid schizophrenia: Secondary | ICD-10-CM | POA: Insufficient documentation

## 2012-08-16 DIAGNOSIS — F1911 Other psychoactive substance abuse, in remission: Secondary | ICD-10-CM | POA: Insufficient documentation

## 2012-08-16 DIAGNOSIS — F1020 Alcohol dependence, uncomplicated: Secondary | ICD-10-CM | POA: Insufficient documentation

## 2012-08-16 NOTE — ED Notes (Addendum)
Pt reports "I have heard of peoples lungs filling up with the mucous and don't want that to happen". Pt denies SI or HI when he reported hx of depression. Pt appears guarded and hesitates when ask questions.

## 2012-08-16 NOTE — ED Notes (Signed)
NAD noted at time of d/c home. Pt called cab for transportation

## 2012-08-16 NOTE — ED Notes (Signed)
Pt brought in via EMS for sore throat x3 days. Denies trouble breathing.

## 2012-08-16 NOTE — ED Provider Notes (Signed)
History     CSN: [id number]  Arrival date & time 08/16/12  4098   First MD Initiated Contact with Patient 08/16/12 [phone]      Chief Complaint  Patient presents with  . Sore Throat     Patient is a 36 y.o. male presenting with pharyngitis. The history is provided by the patient.  Sore Throat This is a new problem. The current episode started more than 2 days ago. The problem occurs daily. The problem has been gradually worsening. Pertinent negatives include no shortness of breath. The symptoms are aggravated by swallowing. Nothing relieves the symptoms. He has tried nothing for the symptoms.  pt reports he has had cough/sore throat for past week No vomiting No fever He is able to swallow without difficulty He reports some bodyaches and feels feverish as well   Past Medical History  Diagnosis Date  . Paranoid schizophrenia   . Drug abuse     xanax addiction 5 years ago  . Alcoholism   . Seizures     History reviewed. No pertinent past surgical history.  History reviewed. No pertinent family history.  History  Substance Use Topics  . Smoking status: Former Games developer  . Smokeless tobacco: Not on file  . Alcohol Use: No      Review of Systems  Constitutional: Negative for fever.  Respiratory: Negative for shortness of breath.     Allergies  Review of patient's allergies indicates no known allergies.  Home Medications   Current Outpatient Rx  Name Route Sig Dispense Refill  . DIVALPROEX SODIUM ER 500 MG PO TB24 Oral Take 3 tablets (1,500 mg total) by mouth at bedtime. For mood control 90 tablet 0  . OMEPRAZOLE 40 MG PO CPDR Oral Take 40 mg by mouth daily.    . QUETIAPINE FUMARATE ER 300 MG PO TB24 Oral Take 600 mg by mouth every evening.    Marland Kitchen SERTRALINE HCL 100 MG PO TABS Oral Take 1 tablet (100 mg total) by mouth daily with breakfast. For depression 30 tablet 0  . QUETIAPINE FUMARATE ER 400 MG PO TB24 Oral Take 2 tablets (800 mg total) by mouth at bedtime. For  mood control 60 tablet 0    BP 122/79  Pulse 95  Temp 99 F (37.2 C) (Oral)  Resp 16  SpO2 95%  Physical Exam CONSTITUTIONAL: Well developed/well nourished HEAD AND FACE: Normocephalic/atraumatic EYES: EOMI/PERRL ENMT: Mucous membranes moist, uvula midline, minimal erythema NECK: supple no meningeal signs CV: S1/S2 noted, no murmurs/rubs/gallops noted LUNGS: Lungs are clear to auscultation bilaterally, no apparent distress ABDOMEN: soft, nontender, no rebound or guarding GU:no cva tenderness NEURO: Pt is awake/alert, moves all extremitiesx4 EXTREMITIES: pulses normal, full ROM SKIN: warm, color normal   ED Course  Procedures   1. Sore throat       MDM  Nursing notes including past medical history and social history reviewed and considered in documentation   Pt well appearing, doubt strep at this time.  Neck is supple, voice normal/no stridor doubt deep space infection of neck Lung sounds clear Reassurance given to patient.  He does not appear acutely psychotic/distressed at this time         Joya Gaskins, MD 08/16/12 [phone]

## 2012-10-10 ENCOUNTER — Emergency Department (HOSPITAL_BASED_OUTPATIENT_CLINIC_OR_DEPARTMENT_OTHER)
Admission: EM | Admit: 2012-10-10 | Discharge: 2012-10-12 | Disposition: A | Discharge status: Other Acute Inpatient Hospital | Payer: Medicare Other | Attending: Emergency Medicine | Admitting: Emergency Medicine

## 2012-10-10 ENCOUNTER — Encounter (HOSPITAL_BASED_OUTPATIENT_CLINIC_OR_DEPARTMENT_OTHER): Payer: Self-pay | Admitting: *Deleted

## 2012-10-10 DIAGNOSIS — F200 Paranoid schizophrenia: Secondary | ICD-10-CM | POA: Insufficient documentation

## 2012-10-10 DIAGNOSIS — G40909 Epilepsy, unspecified, not intractable, without status epilepticus: Secondary | ICD-10-CM | POA: Insufficient documentation

## 2012-10-10 DIAGNOSIS — F29 Unspecified psychosis not due to a substance or known physiological condition: Secondary | ICD-10-CM

## 2012-10-10 DIAGNOSIS — Z87891 Personal history of nicotine dependence: Secondary | ICD-10-CM | POA: Insufficient documentation

## 2012-10-10 DIAGNOSIS — Z79899 Other long term (current) drug therapy: Secondary | ICD-10-CM | POA: Insufficient documentation

## 2012-10-10 LAB — URINALYSIS, ROUTINE W REFLEX MICROSCOPIC
Ketones, ur: 15 mg/dL — AB
Protein, ur: 30 mg/dL — AB
Urobilinogen, UA: 1 mg/dL (ref 0.0–1.0)

## 2012-10-10 LAB — COMPREHENSIVE METABOLIC PANEL
Alkaline Phosphatase: 55 U/L (ref 39–117)
BUN: 15 mg/dL (ref 6–23)
CO2: 19 mEq/L (ref 19–32)
Chloride: 101 mEq/L (ref 96–112)
Creatinine, Ser: 0.7 mg/dL (ref 0.50–1.35)
GFR calc non Af Amer: >90 mL/min (ref >90)
Glucose, Bld: 120 mg/dL — ABNORMAL HIGH (ref 70–99)
Potassium: 3.4 mEq/L — ABNORMAL LOW (ref 3.5–5.1)
Total Bilirubin: 0.6 mg/dL (ref 0.3–1.2)

## 2012-10-10 LAB — CBC WITH DIFFERENTIAL/PLATELET
Basophils Absolute: 0 10*3/uL (ref 0.0–0.1)
Basophils Relative: 0 % (ref 0–1)
Eosinophils Relative: 0 % (ref 0–5)
HCT: 43.1 % (ref 39.0–52.0)
Hemoglobin: 15.5 g/dL (ref 13.0–17.0)
Lymphs Abs: 4.1 10*3/uL — ABNORMAL HIGH (ref 0.7–4.0)
MCV: 89.2 fL (ref 78.0–100.0)
Monocytes Relative: 7 % (ref 3–12)
Neutro Abs: 6 10*3/uL (ref 1.7–7.7)
RDW: 13.9 % (ref 11.5–15.5)
WBC: 10.9 10*3/uL — ABNORMAL HIGH (ref 4.0–10.5)

## 2012-10-10 LAB — RAPID URINE DRUG SCREEN, HOSP PERFORMED
Amphetamines: NOT DETECTED
Opiates: NOT DETECTED
Tetrahydrocannabinol: NOT DETECTED

## 2012-10-10 LAB — SALICYLATE LEVEL: Salicylate Lvl: <2 mg/dL — ABNORMAL LOW (ref 2.8–20.0)

## 2012-10-10 LAB — URINE MICROSCOPIC-ADD ON

## 2012-10-10 LAB — ACETAMINOPHEN LEVEL: Acetaminophen (Tylenol), Serum: <15 ug/mL (ref 10–30)

## 2012-10-10 LAB — ETHANOL: Alcohol, Ethyl (B): <11 mg/dL (ref 0–11)

## 2012-10-10 MED ORDER — ZIPRASIDONE MESYLATE 20 MG IM SOLR
INTRAMUSCULAR | Status: AC
  Administered 2012-10-10: 20 mg via INTRAMUSCULAR
  Filled 2012-10-10: qty 20

## 2012-10-10 MED ORDER — ZIPRASIDONE MESYLATE 20 MG IM SOLR
20.0000 mg | Freq: Once | INTRAMUSCULAR | Status: AC
  Administered 2012-10-10: 20 mg via INTRAMUSCULAR

## 2012-10-10 NOTE — ED Notes (Signed)
[facility] called

## 2012-10-10 NOTE — ED Notes (Signed)
MD at bedside. [employer] crisis at bedside

## 2012-10-10 NOTE — ED Notes (Signed)
MD at bedside. 

## 2012-10-10 NOTE — ED Notes (Signed)
Pt in papers scrubs , door open to room

## 2012-10-10 NOTE — ED Notes (Signed)
Felecia from [facility] called to confirm her arrival to see pt.

## 2012-10-10 NOTE — ED Notes (Signed)
Pt sts he has been thinking about the police and repeating his telephone number for 2-3 days. Pt sts he would like help to relax so he can go back home or to a friend's house. Pt sts he had AH/VH of a friend and the friend was on the roof. Pt speaking loudly and has flight of thought in triage.

## 2012-10-10 NOTE — ED Provider Notes (Addendum)
History     CSN: [id number]  Arrival date & time 10/10/12  1927   First MD Initiated Contact with Patient 10/10/12 2136      Chief Complaint  Patient presents with  . Psychiatric Evaluation    (Consider location/radiation/quality/duration/timing/severity/associated sxs/prior treatment) HPI Level 5 Caveat: psychosis. This is a 36 year old male with a history of paranoid schizophrenia and polysubstance abuse. He is here with several days of anxiety and agitation. He states he stopped taking his medications 5 days ago because he felt they were not working. He states he has not been able to sleep in 5 days. He is frightened although he cannot say specifically what it is it frightens him. He states he has been seeing lights and objects that are not there. He denies auditory hallucinations. It is reported that he had auditory and visual hallucinations the friend was up on the roof; this was reported by another person. He states he would like something to help him relax. The symptoms are moderate to severe. He denies suicidal or homicidal ideation. He denies somatic complaint. He was given 20 mg of IM Geodon prior to my evaluation with little improvement.  Past Medical History  Diagnosis Date  . Paranoid schizophrenia   . Drug abuse     xanax addiction 5 years ago  . Alcoholism   . Seizures     History reviewed. No pertinent past surgical history.  No family history on file.  History  Substance Use Topics  . Smoking status: Former Games developer  . Smokeless tobacco: Not on file  . Alcohol Use: No      Review of Systems  Unable to perform ROS   Allergies  Review of patient's allergies indicates no known allergies.  Home Medications   Current Outpatient Rx  Name  Route  Sig  Dispense  Refill  . DIVALPROEX SODIUM ER 500 MG PO TB24   Oral   Take 3 tablets (1,500 mg total) by mouth at bedtime. For mood control   90 tablet   0   . OMEPRAZOLE 40 MG PO CPDR   Oral   Take 40 mg  by mouth daily.         . QUETIAPINE FUMARATE ER 300 MG PO TB24   Oral   Take 600 mg by mouth every evening.         Marland Kitchen QUETIAPINE FUMARATE ER 400 MG PO TB24   Oral   Take 2 tablets (800 mg total) by mouth at bedtime. For mood control   60 tablet   0   . SERTRALINE HCL 100 MG PO TABS   Oral   Take 1 tablet (100 mg total) by mouth daily with breakfast. For depression   30 tablet   0     BP 147/98  Pulse 124  Temp 97.4 F (36.3 C) (Oral)  Resp 22  Wt 216 lb (97.977 kg)  SpO2 100%  Physical Exam General: Well-developed, well-nourished male in no acute distress; appearance consistent with age of record; pacing nervously; does not wish to sit down HENT: normocephalic, atraumatic Eyes: pupils equal round and reactive to light; extraocular muscles intact Neck: supple Heart: regular rate and rhythm Lungs: clear to auscultation bilaterally Abdomen: soft; nondistended; nontender; bowel sounds present Extremities: No deformity; full range of motion Neurologic: Awake, alert; motor function intact in all extremities and symmetric; no facial droop Skin: Warm and dry Psychiatric: Anxious; agitated; akathisia; emotional lability; paranoia; no SI; no HI    ED Course  Procedures (including critical care time)     MDM   Nursing notes and vitals signs, including pulse oximetry, reviewed.  Summary of this visit's results, reviewed by myself:  Labs:  Results for orders placed during the hospital encounter of 10/10/12 (from the past 24 hour(s))  CBC WITH DIFFERENTIAL     Status: Abnormal   Collection Time   10/10/12  8:00 PM      Component Value Range   WBC 10.9 (*) 4.0 - 10.5 K/uL   RBC 4.83  4.22 - 5.81 MIL/uL   Hemoglobin 15.5  13.0 - 17.0 g/dL   HCT 16.1  09.6 - 04.5 %   MCV 89.2  78.0 - 100.0 fL   MCH 32.1  26.0 - 34.0 pg   MCHC 36.0  30.0 - 36.0 g/dL   RDW 40.9  81.1 - 91.4 %   Platelets 302  150 - 400 K/uL   Neutrophils Relative 55  43 - 77 %   Lymphocytes  Relative 38  12 - 46 %   Monocytes Relative 7  3 - 12 %   Eosinophils Relative 0  0 - 5 %   Basophils Relative 0  0 - 1 %   Neutro Abs 6.0  1.7 - 7.7 K/uL   Lymphs Abs 4.1 (*) 0.7 - 4.0 K/uL   Monocytes Absolute 0.8  0.1 - 1.0 K/uL   Eosinophils Absolute 0.0  0.0 - 0.7 K/uL   Basophils Absolute 0.0  0.0 - 0.1 K/uL   Smear Review MORPHOLOGY UNREMARKABLE    COMPREHENSIVE METABOLIC PANEL     Status: Abnormal   Collection Time   10/10/12  8:00 PM      Component Value Range   Sodium 138  135 - 145 mEq/L   Potassium 3.4 (*) 3.5 - 5.1 mEq/L   Chloride 101  96 - 112 mEq/L   CO2 19  19 - 32 mEq/L   Glucose, Bld 120 (*) 70 - 99 mg/dL   BUN 15  6 - 23 mg/dL   Creatinine, Ser 7.82  0.50 - 1.35 mg/dL   Calcium 9.7  8.4 - 95.6 mg/dL   Total Protein 8.5 (*) 6.0 - 8.3 g/dL   Albumin 4.8  3.5 - 5.2 g/dL   AST 33  0 - 37 U/L   ALT 30  0 - 53 U/L   Alkaline Phosphatase 55  39 - 117 U/L   Total Bilirubin 0.6  0.3 - 1.2 mg/dL   GFR calc non Af Amer >90  >90 mL/min   GFR calc Af Amer >90  >90 mL/min  URINALYSIS, ROUTINE W REFLEX MICROSCOPIC     Status: Abnormal   Collection Time   10/10/12  8:00 PM      Component Value Range   Color, Urine ORANGE (*) YELLOW   APPearance CLOUDY (*) CLEAR   Specific Gravity, Urine 1.036 (*) 1.005 - 1.030   pH 5.5  5.0 - 8.0   Glucose, UA NEGATIVE  NEGATIVE mg/dL   Hgb urine dipstick NEGATIVE  NEGATIVE   Bilirubin Urine SMALL (*) NEGATIVE   Ketones, ur 15 (*) NEGATIVE mg/dL   Protein, ur 30 (*) NEGATIVE mg/dL   Urobilinogen, UA 1.0  0.0 - 1.0 mg/dL   Nitrite NEGATIVE  NEGATIVE   Leukocytes, UA TRACE (*) NEGATIVE  URINE RAPID DRUG SCREEN (HOSP PERFORMED)     Status: Normal   Collection Time   10/10/12  8:00 PM      Component Value Range  Opiates NONE DETECTED  NONE DETECTED   Cocaine NONE DETECTED  NONE DETECTED   Benzodiazepines NONE DETECTED  NONE DETECTED   Amphetamines NONE DETECTED  NONE DETECTED   Tetrahydrocannabinol NONE DETECTED  NONE DETECTED    Barbiturates NONE DETECTED  NONE DETECTED  SALICYLATE LEVEL     Status: Abnormal   Collection Time   10/10/12  8:00 PM      Component Value Range   Salicylate Lvl <2.0 (*) 2.8 - 20.0 mg/dL  ACETAMINOPHEN LEVEL     Status: Normal   Collection Time   10/10/12  8:00 PM      Component Value Range   Acetaminophen (Tylenol), Serum <15.0  10 - 30 ug/mL  ETHANOL     Status: Normal   Collection Time   10/10/12  8:00 PM      Component Value Range   Alcohol, Ethyl (B) <11  0 - 11 mg/dL  URINE MICROSCOPIC-ADD ON     Status: Abnormal   Collection Time   10/10/12  8:00 PM      Component Value Range   Squamous Epithelial / LPF RARE  RARE   WBC, UA 3-6  <3 WBC/hpf   RBC / HPF 0-2  <3 RBC/hpf   Bacteria, UA FEW (*) RARE   Casts HYALINE CASTS (*) NEGATIVE   Urine-Other MUCOUS PRESENT    VALPROIC ACID LEVEL     Status: Abnormal   Collection Time   10/10/12  8:00 PM      Component Value Range   Valproic Acid Lvl <10.0 (*) 50.0 - 100.0 ug/mL    9:45 PM [facility] here to assess.         Hanley Seamen, MD 10/10/12 2145  Hanley Seamen, MD 10/10/12 2145

## 2012-10-10 NOTE — ED Notes (Signed)
Contacted [facility] to get a sitter since pt is now IVC. No available sitter to send at this time.

## 2012-10-10 NOTE — ED Notes (Signed)
May call step-father with any questions. Hal Sveum [phone].

## 2012-10-11 MED ORDER — DIVALPROEX SODIUM ER 500 MG PO TB24
1500.0000 mg | ORAL_TABLET | Freq: Every day | ORAL | Status: DC
Start: 2012-10-11 — End: 2012-10-12
  Administered 2012-10-11: 1500 mg via ORAL
  Filled 2012-10-11 (×4): qty 3

## 2012-10-11 MED ORDER — PANTOPRAZOLE SODIUM 40 MG PO TBEC
40.0000 mg | DELAYED_RELEASE_TABLET | Freq: Every day | ORAL | Status: DC
  Administered 2012-10-11: 40 mg via ORAL
  Filled 2012-10-11: qty 1

## 2012-10-11 MED ORDER — QUETIAPINE FUMARATE ER 300 MG PO TB24
600.0000 mg | ORAL_TABLET | Freq: Every evening | ORAL | Status: DC
  Administered 2012-10-11: 600 mg via ORAL
  Filled 2012-10-11 (×3): qty 2

## 2012-10-11 MED ORDER — LORAZEPAM 1 MG PO TABS
ORAL_TABLET | ORAL | Status: AC
  Administered 2012-10-11: 1 mg via ORAL
  Filled 2012-10-11: qty 1

## 2012-10-11 MED ORDER — LORAZEPAM 1 MG PO TABS
1.0000 mg | ORAL_TABLET | Freq: Three times a day (TID) | ORAL | Status: DC | PRN
  Administered 2012-10-11 (×2): 1 mg via ORAL
  Filled 2012-10-11: qty 1

## 2012-10-11 MED ORDER — ONDANSETRON HCL 4 MG PO TABS: 4.0000 mg | ORAL_TABLET | Freq: Three times a day (TID) | ORAL | Status: DC | PRN

## 2012-10-11 MED ORDER — LORAZEPAM 1 MG PO TABS
1.0000 mg | ORAL_TABLET | Freq: Once | ORAL | Status: AC
  Administered 2012-10-11: 1 mg via ORAL

## 2012-10-11 MED ORDER — IBUPROFEN 600 MG PO TABS: 600.0000 mg | ORAL_TABLET | Freq: Three times a day (TID) | ORAL | Status: DC | PRN

## 2012-10-11 MED ORDER — NICOTINE 21 MG/24HR TD PT24
21.0000 mg | MEDICATED_PATCH | Freq: Every day | TRANSDERMAL | Status: DC
  Filled 2012-10-11: qty 1

## 2012-10-11 MED ORDER — ACETAMINOPHEN 325 MG PO TABS: 650.0000 mg | ORAL_TABLET | ORAL | Status: DC | PRN

## 2012-10-11 MED ORDER — LORAZEPAM 1 MG PO TABS
ORAL_TABLET | ORAL | Status: AC
  Filled 2012-10-11: qty 1

## 2012-10-11 MED ORDER — SERTRALINE HCL 50 MG PO TABS
100.0000 mg | ORAL_TABLET | Freq: Every day | ORAL | Status: DC
  Administered 2012-10-11 – 2012-10-12 (×2): 100 mg via ORAL
  Filled 2012-10-11: qty 2
  Filled 2012-10-11 (×2): qty 1

## 2012-10-11 NOTE — ED Notes (Signed)
While doing a 15 minute check writer noticed pt's eyes open so she spoke to him. He jumped up in the bed to a sitting position quickly and said this is the room and looked around as though he was seeing something in the room. He seemed very disoriented and asked what did Clinical research associate want. Writer explained to him he was in a room at the [facility] and had been here all day, he said yes I remember that. He said he's not sure what the voices say to him. He says he sees Dr Betti Cruz but decided to take his medications every few days and then not for a few days because he wasn't sleeping. Advised pt that in the future no to stop taking his medications but to notify Dr Betti Cruz as he could have a seizure from stopping certain medications abruptly. Pt said oh yeah i forgot about that. Explained to pt that TA was here working on placing him somewhere for further treatment and stabilization. He said yes, i'd like to go to behavioral health. Pt denies SI/HI. Pt clearly has disorganized thoughts, +AVH.

## 2012-10-11 NOTE — ED Notes (Signed)
Dr. Bebe Shaggy spoke to Dr. Manus Gunning at [facility]

## 2012-10-11 NOTE — ED Notes (Signed)
Pt up to bathroom.

## 2012-10-11 NOTE — ED Notes (Signed)
zoloft and seroquel not available, ordered from Main pharmacy at [facility] and called delivery service [phone]

## 2012-10-11 NOTE — ED Notes (Signed)
Patient is very worried and agitated. He wants to be involved with his care. Patient states that he is nervous and all of this is new to him and he just does not know what to do. Tech redirected the patient and told him to relax in his room and try to calm down. Patient agreed and said he would watch tv as long as we keep him informed of what is going on.

## 2012-10-11 NOTE — [facility] Counselor (Signed)
Upon leaving, Marijean Niemann from [facility] stated to Clinical research associate that someone from TA would be back in am to work on placement for patient.

## 2012-10-11 NOTE — ED Provider Notes (Addendum)
Pt stable and in no distress Vitals improved He is awaiting placement currently No acute issues at this time BP 135/100  Pulse 107  Temp 97.4 F (36.3 C) (Oral)  Resp 22  Wt 216 lb (97.977 kg)  SpO2 97%   Joya Gaskins, MD 10/11/12 0905  4:38 PM No inpatient psych beds available Will transfer to [facility] ER for telepsych and further Pt is under IVC will need transfer by law enforcement Spoke to dr Manus Gunning and will place in [facility] psych ED Pt has been stable, walking around ER in no distress  Joya Gaskins, MD 10/11/12 1639

## 2012-10-11 NOTE — ED Notes (Signed)
Patient transferred to [facility] ER by [employer], Officer Bye

## 2012-10-11 NOTE — ED Notes (Signed)
Pt difficult to assess, takes long time to respond to questions, suspicious of questions asked about hearing voices and if in pain, pt states "you know why im here" and does not consistantly answer questions.

## 2012-10-11 NOTE — ED Provider Notes (Signed)
Pt sent from [facility] to wait for placement in the psychiatric ED.  Pt is restless in the bed but in no distress.  Continue to monitor the mild tachycardia.   Celene Kras, MD 10/11/12 [phone]

## 2012-10-11 NOTE — ED Notes (Signed)
Upon examination of paperwork, noted two fax reports did not transmit.  Re-attempting faxing of reports.

## 2012-10-11 NOTE — ED Notes (Signed)
Albert [name], TA rep called in and said that pt is accepted to [facility] [phone] by Dr Su Hilt. Report needs to be called and they are expecting him whenever we can get him there.

## 2012-10-11 NOTE — ED Notes (Signed)
[facility] counselor advises that pt is now under IVC. She sts that she has faxed a bed request to several facilities that have beds available but that pt will probably not be accepted until later this morning when MD's arrive at those facilities. She advises all paperwork is on pt's chart.

## 2012-10-11 NOTE — ED Notes (Signed)
Called [facility] and informed them that patient had been transferred to [facility] [phone] & they will follow up with staff regarding placement of patient

## 2012-10-11 NOTE — ED Notes (Signed)
Pt asked for and given items to shower. He said would I come back in his room if i needed anything else and he said so they are done for the night looking for a place to for me right, told him know TA is here working on his stuff currently. Pt currently in the shower.

## 2012-10-11 NOTE — ED Notes (Signed)
Called and spoke to [name] with [facility].  She stated she would check into the status of bed placement for the patient and will call back.  Informed patient,  Albert [name] and Dr. Bebe Shaggy.

## 2012-10-12 LAB — URINE CULTURE
Colony Count: NO GROWTH
Culture: NO GROWTH

## 2012-10-12 NOTE — ED Notes (Signed)
Sheriff dept here to transport pt to [facility]. Pt belongings give to officer as well as IVC paperwork and dc papers.

## 2012-10-12 NOTE — ED Notes (Signed)
Pt had questions about what was happening with him. SN instructed pt that he would be leaving for [facility] this am. Pt stated "ok" but stated he is nervous about going there but said "its ok, i need help".

## 2012-10-12 NOTE — ED Notes (Signed)
Pt was twitching in his sleep. Woke pt up to see if he was ok and to make sure he wasn't having a seizure since he has a history of seizures according to his medical history. Pt became upset at the questions and started cussing at [employer] and tech. He was also talking non-sensical about talking with someone at the courthouse. Tech Estate manager/land agent left the room. Pt came out up to the nursing station and politely asked for something to drink. He is currently laying back down in his bed.

## 2012-10-12 NOTE — ED Provider Notes (Signed)
Albert [name] is a 36 y.o. male who was been noncompliant of his medications. He has been seen by [facility], who feels that he is psychiatrically unstable. His medications have been initiated. He is tolerating them. Vital signs are stable. The patient is calm, and cooperative, and understands that he is to be transferred to a psychiatric facility. He has no questions. We are awaiting transport by law enforcement.  Flint Melter, MD 10/12/12 [phone]

## 2012-10-12 NOTE — ED Notes (Signed)
Message left for Sgt Pascal for transportation in the am to [facility].

## 2012-10-12 NOTE — ED Notes (Signed)
Sheriffs office called and will be here to transport pt around 915am.

## 2012-10-18 ENCOUNTER — Telehealth (HOSPITAL_COMMUNITY): Payer: Self-pay | Admitting: Licensed Clinical Social Worker

## 2012-11-28 ENCOUNTER — Encounter (HOSPITAL_COMMUNITY): Payer: Self-pay | Admitting: Emergency Medicine

## 2012-11-28 ENCOUNTER — Emergency Department (HOSPITAL_COMMUNITY)
Admission: EM | Admit: 2012-11-28 | Discharge: 2012-11-28 | Disposition: A | Discharge status: Other Acute Inpatient Hospital | Payer: Medicare Other | Source: Home / Self Care | Attending: Emergency Medicine | Admitting: Emergency Medicine

## 2012-11-28 ENCOUNTER — Inpatient Hospital Stay (HOSPITAL_COMMUNITY)
Admission: AD | Admit: 2012-11-28 | Discharge: 2012-12-09 | DRG: 885 | Disposition: A | Discharge status: Home / Self Care | Payer: Medicare Other | Source: Intra-hospital | Attending: Psychiatry | Admitting: Psychiatry

## 2012-11-28 ENCOUNTER — Encounter (HOSPITAL_COMMUNITY): Payer: Self-pay | Admitting: Rehabilitation

## 2012-11-28 DIAGNOSIS — F1010 Alcohol abuse, uncomplicated: Secondary | ICD-10-CM | POA: Diagnosis present

## 2012-11-28 DIAGNOSIS — Z9119 Patient's noncompliance with other medical treatment and regimen: Secondary | ICD-10-CM

## 2012-11-28 DIAGNOSIS — F1910 Other psychoactive substance abuse, uncomplicated: Secondary | ICD-10-CM

## 2012-11-28 DIAGNOSIS — F1020 Alcohol dependence, uncomplicated: Secondary | ICD-10-CM

## 2012-11-28 DIAGNOSIS — F29 Unspecified psychosis not due to a substance or known physiological condition: Secondary | ICD-10-CM

## 2012-11-28 DIAGNOSIS — Z87891 Personal history of nicotine dependence: Secondary | ICD-10-CM | POA: Insufficient documentation

## 2012-11-28 DIAGNOSIS — F1021 Alcohol dependence, in remission: Secondary | ICD-10-CM | POA: Insufficient documentation

## 2012-11-28 DIAGNOSIS — Z91199 Patient's noncompliance with other medical treatment and regimen due to unspecified reason: Secondary | ICD-10-CM

## 2012-11-28 DIAGNOSIS — Z8669 Personal history of other diseases of the nervous system and sense organs: Secondary | ICD-10-CM | POA: Insufficient documentation

## 2012-11-28 DIAGNOSIS — F22 Delusional disorders: Secondary | ICD-10-CM | POA: Insufficient documentation

## 2012-11-28 DIAGNOSIS — R443 Hallucinations, unspecified: Secondary | ICD-10-CM | POA: Insufficient documentation

## 2012-11-28 DIAGNOSIS — Z79899 Other long term (current) drug therapy: Secondary | ICD-10-CM | POA: Insufficient documentation

## 2012-11-28 DIAGNOSIS — G479 Sleep disorder, unspecified: Secondary | ICD-10-CM | POA: Insufficient documentation

## 2012-11-28 DIAGNOSIS — F200 Paranoid schizophrenia: Secondary | ICD-10-CM | POA: Insufficient documentation

## 2012-11-28 DIAGNOSIS — R569 Unspecified convulsions: Secondary | ICD-10-CM | POA: Insufficient documentation

## 2012-11-28 DIAGNOSIS — F19939 Other psychoactive substance use, unspecified with withdrawal, unspecified: Secondary | ICD-10-CM | POA: Insufficient documentation

## 2012-11-28 DIAGNOSIS — IMO0002 Reserved for concepts with insufficient information to code with codable children: Secondary | ICD-10-CM | POA: Insufficient documentation

## 2012-11-28 HISTORY — DX: Patient's noncompliance with other medical treatment and regimen due to unspecified reason: Z91.199

## 2012-11-28 HISTORY — DX: Patient's noncompliance with other medical treatment and regimen: Z91.19

## 2012-11-28 LAB — CBC WITH DIFFERENTIAL/PLATELET
Basophils Absolute: 0 10*3/uL (ref 0.0–0.1)
Basophils Relative: 1 % (ref 0–1)
Eosinophils Absolute: 0.2 10*3/uL (ref 0.0–0.7)
Eosinophils Relative: 2 % (ref 0–5)
HCT: 44.2 % (ref 39.0–52.0)
Hemoglobin: 15.9 g/dL (ref 13.0–17.0)
MCH: 33.4 pg (ref 26.0–34.0)
MCHC: 36 g/dL (ref 30.0–36.0)
MCV: 92.9 fL (ref 78.0–100.0)
Monocytes Absolute: 0.5 10*3/uL (ref 0.1–1.0)
Monocytes Relative: 8 % (ref 3–12)
RDW: 13.2 % (ref 11.5–15.5)

## 2012-11-28 LAB — RAPID URINE DRUG SCREEN, HOSP PERFORMED
Amphetamines: NOT DETECTED
Cocaine: NOT DETECTED
Opiates: NOT DETECTED
Tetrahydrocannabinol: NOT DETECTED

## 2012-11-28 LAB — ETHANOL: Alcohol, Ethyl (B): <11 mg/dL (ref 0–11)

## 2012-11-28 LAB — POCT I-STAT, CHEM 8
Calcium, Ion: 1.22 mmol/L (ref 1.12–1.23)
Creatinine, Ser: 0.8 mg/dL (ref 0.50–1.35)
Glucose, Bld: 117 mg/dL — ABNORMAL HIGH (ref 70–99)
HCT: 47 % (ref 39.0–52.0)
Hemoglobin: 16 g/dL (ref 13.0–17.0)
Potassium: 3.9 mEq/L (ref 3.5–5.1)

## 2012-11-28 MED ORDER — ACETAMINOPHEN 325 MG PO TABS: 650.0000 mg | ORAL_TABLET | Freq: Four times a day (QID) | ORAL | Status: DC | PRN

## 2012-11-28 MED ORDER — LORAZEPAM 1 MG PO TABS: 1.0000 mg | ORAL_TABLET | Freq: Three times a day (TID) | ORAL | Status: DC | PRN

## 2012-11-28 MED ORDER — ONDANSETRON HCL 8 MG PO TABS: 4.0000 mg | ORAL_TABLET | Freq: Three times a day (TID) | ORAL | Status: DC | PRN

## 2012-11-28 MED ORDER — MAGNESIUM HYDROXIDE 400 MG/5ML PO SUSP: 30.0000 mL | Freq: Every day | ORAL | Status: DC | PRN

## 2012-11-28 MED ORDER — QUETIAPINE FUMARATE ER 400 MG PO TB24
400.0000 mg | ORAL_TABLET | Freq: Every evening | ORAL | Status: DC
  Administered 2012-11-28 – 2012-11-29 (×2): 400 mg via ORAL
  Filled 2012-11-28 (×4): qty 1

## 2012-11-28 MED ORDER — ZIPRASIDONE MESYLATE 20 MG IM SOLR
10.0000 mg | Freq: Once | INTRAMUSCULAR | Status: AC
  Administered 2012-11-28: 10 mg via INTRAMUSCULAR

## 2012-11-28 MED ORDER — ACETAMINOPHEN 325 MG PO TABS: 650.0000 mg | ORAL_TABLET | ORAL | Status: DC | PRN

## 2012-11-28 MED ORDER — DIVALPROEX SODIUM ER 500 MG PO TB24
1500.0000 mg | ORAL_TABLET | Freq: Every day | ORAL | Status: DC
  Administered 2012-11-29 – 2012-12-08 (×10): 1500 mg via ORAL
  Filled 2012-11-28 (×12): qty 3
  Filled 2012-11-28: qty 42
  Filled 2012-11-28: qty 3

## 2012-11-28 MED ORDER — QUETIAPINE FUMARATE ER 300 MG PO TB24
600.0000 mg | ORAL_TABLET | Freq: Every evening | ORAL | Status: DC
  Filled 2012-11-28: qty 2

## 2012-11-28 MED ORDER — PANTOPRAZOLE SODIUM 40 MG PO TBEC
40.0000 mg | DELAYED_RELEASE_TABLET | Freq: Every day | ORAL | Status: DC
  Administered 2012-11-28: 40 mg via ORAL
  Filled 2012-11-28: qty 1

## 2012-11-28 MED ORDER — LORAZEPAM 2 MG/ML IJ SOLN
1.0000 mg | Freq: Once | INTRAMUSCULAR | Status: AC
  Administered 2012-11-28: 1 mg via INTRAMUSCULAR
  Filled 2012-11-28: qty 1

## 2012-11-28 MED ORDER — ZIPRASIDONE MESYLATE 20 MG IM SOLR
10.0000 mg | Freq: Once | INTRAMUSCULAR | Status: AC
  Administered 2012-11-28: 10 mg via INTRAMUSCULAR
  Filled 2012-11-28: qty 20

## 2012-11-28 MED ORDER — DIVALPROEX SODIUM ER 500 MG PO TB24
1500.0000 mg | ORAL_TABLET | Freq: Every day | ORAL | Status: DC
  Filled 2012-11-28: qty 3

## 2012-11-28 MED ORDER — LORAZEPAM 1 MG PO TABS
1.0000 mg | ORAL_TABLET | Freq: Three times a day (TID) | ORAL | Status: DC | PRN
  Administered 2012-11-28: 1 mg via ORAL
  Filled 2012-11-28: qty 1

## 2012-11-28 MED ORDER — ALUM & MAG HYDROXIDE-SIMETH 200-200-20 MG/5ML PO SUSP: 30.0000 mL | ORAL | Status: DC | PRN

## 2012-11-28 MED ORDER — IBUPROFEN 200 MG PO TABS: 600.0000 mg | ORAL_TABLET | Freq: Three times a day (TID) | ORAL | Status: DC | PRN

## 2012-11-28 MED ORDER — HYDROXYZINE HCL 50 MG PO TABS
50.0000 mg | ORAL_TABLET | Freq: Three times a day (TID) | ORAL | Status: DC | PRN
  Administered 2012-12-04 – 2012-12-06 (×2): 50 mg via ORAL
  Filled 2012-11-28 (×2): qty 1

## 2012-11-28 MED ORDER — OLANZAPINE 10 MG PO TBDP: 10.0000 mg | ORAL_TABLET | Freq: Once | ORAL | Status: AC | PRN

## 2012-11-28 NOTE — Progress Notes (Signed)
Patient ID: Albert [name], male   DOB: 07-17-1976, 37 y.o.   MRN: [id number] PER STATE REGULATIONS 482.30  THIS CHART WAS REVIEWED FOR MEDICAL NECESSITY WITH RESPECT TO THE PATIENT'S ADMISSION/ DURATION OF STAY.  NEXT REVIEW DATE: 12/01/2012  Willa Rough, RN, BSN CASE MANAGER

## 2012-11-28 NOTE — ED Notes (Signed)
Pt breakfast tray arrived. Pt is up in bed eating.

## 2012-11-28 NOTE — ED Notes (Signed)
Pt given 2nd dose of Geodon (10mg ) pt started to feel nervous and wanted to leave. Per MD, give another mg of ativan IM. Pt is now sitting on the edge of the bed talking to staff calmly.

## 2012-11-28 NOTE — ED Notes (Signed)
Pt states that he is feeling tired now, but does not want to lay down, because he doesn't "want to feel like he did last time". Nurse asked pt to clarify, and pt states he doesn't want to feel nervous like when he received the 2nd dose of geodon.

## 2012-11-28 NOTE — ED Provider Notes (Signed)
History     CSN: [id number]  Arrival date & time 11/28/12  0400   First MD Initiated Contact with Patient 11/28/12 [phone]      Chief Complaint  Patient presents with  . V70.1    (Consider location/radiation/quality/duration/timing/severity/associated sxs/prior treatment) Patient is a 37 y.o. male presenting with mental health disorder. The history is provided by the patient. The history is limited by the condition of the patient (psychotic).  Mental Health Problem The primary symptoms include delusions, hallucinations, bizarre behavior and disorganized speech. Episode onset: unknown.  Onset: unknown. Progression: unknown. He has delusions of broadcasting and thought insertion.  He has auditory hallucinations.  Onset: unknown. He has abnormal social behavior and agitated behavior.  Onset: unknown. His speech exhibits pressured speech, tangentiality, illogicality, derailment, circumstantiality and distractibility.  Precipitated by: unknown. The degree of incapacity that he is experiencing as a consequence of his illness is severe. Additional symptoms of the illness include agitation and flight of ideas. He does not admit to suicidal ideas. Risk factors that are present for mental illness include a history of mental illness and substance abuse.  Patient has a baby's bottle with liquid stool in it.    Past Medical History  Diagnosis Date  . Paranoid schizophrenia   . Drug abuse     xanax addiction 5 years ago  . Alcoholism   . Seizures     History reviewed. No pertinent past surgical history.  No family history on file.  History  Substance Use Topics  . Smoking status: Former Games developer  . Smokeless tobacco: Not on file  . Alcohol Use: No      Review of Systems  Psychiatric/Behavioral: Positive for hallucinations, sleep disturbance and agitation. Negative for self-injury.  All other systems reviewed and are negative.    Allergies  Review of patient's allergies indicates no  known allergies.  Home Medications   Current Outpatient Rx  Name  Route  Sig  Dispense  Refill  . DIVALPROEX SODIUM ER 500 MG PO TB24   Oral   Take 3 tablets (1,500 mg total) by mouth at bedtime. For mood control   90 tablet   0   . OMEPRAZOLE 40 MG PO CPDR   Oral   Take 40 mg by mouth daily.         . QUETIAPINE FUMARATE ER 300 MG PO TB24   Oral   Take 600 mg by mouth every evening.         Marland Kitchen SERTRALINE HCL 100 MG PO TABS   Oral   Take 1 tablet (100 mg total) by mouth daily with breakfast. For depression   30 tablet   0     BP 159/94  Pulse 111  Temp 98.2 F (36.8 C) (Oral)  SpO2 98%  Physical Exam  Constitutional: He appears well-developed and well-nourished. No distress.  HENT:  Head: Normocephalic and atraumatic.  Mouth/Throat: Oropharynx is clear and moist.  Eyes: Conjunctivae normal are normal. Pupils are equal, round, and reactive to light.  Neck: Normal range of motion. Neck supple.  Cardiovascular: Normal rate, regular rhythm and intact distal pulses.   Pulmonary/Chest: Effort normal and breath sounds normal. He has no wheezes. He has no rales.  Abdominal: Soft. Bowel sounds are normal. There is no tenderness. There is no rebound and no guarding.  Musculoskeletal: Normal range of motion.  Neurological: He is alert. He has normal reflexes.  Psychiatric: His affect is inappropriate. His speech is rapid and/or pressured and tangential. He  is agitated and actively hallucinating. Thought content is paranoid and delusional. He expresses impulsivity.    ED Course  Procedures (including critical care time)  Labs Reviewed  POCT I-STAT, CHEM 8 - Abnormal; Notable for the following:    BUN 5 (*)     Glucose, Bld 117 (*)     All other components within normal limits  CBC WITH DIFFERENTIAL  HEPATITIS PANEL, ACUTE  VALPROIC ACID LEVEL  ETHANOL  URINE RAPID DRUG SCREEN (HOSP PERFORMED)   No results found.   No diagnosis found.    MDM   Date:  11/28/2012  Rate: 114  Rhythm: sinus tachycardia  QRS Axis: normal  Intervals: normal  ST/T Wave abnormalities: normal  Conduction Disutrbances:none  Narrative Interpretation:   Old EKG Reviewed: none available     Ativan given for acute psychosis and agitation without change in patient;s symptoms.  Geodon IM given.  Still anxious and wanting to leave but EDP had committed the patient.  Patient will need inpatient psychiatric care   MDM Reviewed: previous chart, nursing note and vitals Interpretation: labs and ECG Total time providing critical care: 75-105 minutes. This excludes time spent performing separately reportable procedures and services. Consults: ACT team.  CRITICAL CARE Performed by: Jasmine Awe   Total critical care time: 90 minutes  Critical care time was exclusive of separately billable procedures and treating other patients.  Critical care was necessary to treat or prevent imminent or life-threatening deterioration.  Critical care was time spent personally by me on the following activities: development of treatment plan with patient and/or surrogate as well as nursing, discussions with consultants, evaluation of patient's response to treatment, examination of patient, obtaining history from patient or surrogate, ordering and performing treatments and interventions, ordering and review of laboratory studies, ordering and review of radiographic studies, pulse oximetry and re-evaluation of patient's condition.    Jasmine Awe, MD 11/28/12 [phone]

## 2012-11-28 NOTE — ED Notes (Signed)
Pt is denying having thoughts about hurting himself or others. Pt denies having auditor/visual hallucinations

## 2012-11-28 NOTE — Progress Notes (Signed)
Called to verify medications per physician assistant Mashburn for the [facility] located on [address]; spoke with [name] (pharmacy tech); patient picked up depakote ER 500mg  and he takes 1 tab in the morning and 2 tabs at night with a quantity of 90 and also seroquel XR 300 mg and its prescribed for him to take 2 tabs daily with a quantity of 60 and they were picked up on 11/03/12;

## 2012-11-28 NOTE — H&P (Signed)
Psychiatric Admission Assessment Adult  Patient Identification:  Carroll Lingelbach Date of Evaluation:  11/28/2012 Chief Complaint:  Schizophrenia, paranoid type History of Present Illness: Walt is a 37 year old WM who presented to the ED yesterday requesting assistance with his mental illness. He has a long history of schizophrenia paranoid type with multiple admissions to [facility] in the past. His last being March of 2013.  He was noted to be floridly psychotic and responding to internal stimulation, unable to participate in ROS. Much of the history is provided from chart review and observation. He was given medical clearance and transferred to [facility] for crisis management and stabilization. Elements:  Location:  In patient [facility] admission. Quality:  chronic. Severity:  moderate to severe. Timing:  worsening over the last several weeks. Duration:  years. Context:  all facets of his life.. Associated Signs/Synptoms: Depression Symptoms:  Unable to assess (Hypo) Manic Symptoms:  Delusions, Flight of Ideas, Hallucinations, Anxiety Symptoms:  unknown Psychotic Symptoms:  Delusions, Hallucinations: Auditory PTSD Symptoms: NA  Psychiatric Specialty Exam: Physical Exam  Constitutional: He appears well-developed and well-nourished.  Reviewed PE in ED and agree with those findings.  ROS patient too acute to complete at this time.  Blood pressure 131/73, pulse 90, temperature 97.9 F (36.6 C), temperature source Oral, resp. rate 18, height 5' 9.5" (1.765 m), weight 107.049 kg (236 lb), SpO2 96.00%.Body mass index is 34.35 kg/(m^2).  General Appearance: Disheveled  Eye Solicitor::  Fair  Speech:  Pressured  Volume:  Normal  Mood:  Euthymic  Affect:  Congruent  Thought Process:  Circumstantial and Disorganized  Orientation:  NA  Thought Content:  Delusions and Hallucinations: Auditory  Suicidal Thoughts:  No  Homicidal Thoughts:  No  Memory:  Immediate;   NA  Judgement:  Impaired   Insight:  Lacking  Psychomotor Activity:  Normal  Concentration:  Fair  Recall:  Fair  Akathisia:  No  Handed:    AIMS (if indicated):     Assets:  Communication Skills Desire for Improvement Physical Health Social Support  Sleep:       Past Psychiatric History: Diagnosis:  Hospitalizations:  Outpatient Care:  Substance Abuse Care:  Self-Mutilation:  Suicidal Attempts:  Violent Behaviors:   Past Medical History:   Past Medical History  Diagnosis Date  . Drug abuse     xanax addiction 5 years ago  . Seizures   . Paranoid schizophrenia   . Alcoholism    None. Allergies:  No Known Allergies PTA Medications: Prescriptions prior to admission  Medication Sig Dispense Refill  . divalproex (DEPAKOTE ER) 500 MG 24 hr tablet Take 500-1,000 mg by mouth 2 (two) times daily. Take 500mg  in AM and 1000mg  in PM      . omeprazole (PRILOSEC) 40 MG capsule Take 40 mg by mouth daily as needed. For acid reflux      . QUEtiapine (SEROQUEL XR) 300 MG 24 hr tablet Take 300 mg by mouth at bedtime.        Previous Psychotropic Medications:  Medication/Dose      Patient's Serouqel XR dose was 300mg  (2) at hs. He states he and Dr. Betti Cruz agreed to cut it down from 600mg  to 300mg .  This is his second admission in 2 months.             Substance Abuse History in the last 12 months:  unknown  Consequences of Substance Abuse:unknown  Social History:  reports that he has quit smoking. He does not have any  smokeless tobacco history on file. He reports that he does not drink alcohol or use illicit drugs. Additional Social History: History of alcohol / drug use?: Yes (sober 6 years)   Current Place of Residence:   Place of Birth:   Family Members: Marital Status:  Single Children:  Sons:  Daughters: Relationships: Education:   Educational Problems/Performance: Religious Beliefs/Practices: History of Abuse (Emotional/Phsycial/Sexual) Occupational Experiences; Military History:    Legal History: Hobbies/Interests:  Family History:  No family history on file.  Results for orders placed during the hospital encounter of 11/28/12 (from the past 72 hour(s))  CBC WITH DIFFERENTIAL     Status: Normal   Collection Time   11/28/12  4:18 AM      Component Value Range Comment   WBC 6.4  4.0 - 10.5 K/uL    RBC 4.76  4.22 - 5.81 MIL/uL    Hemoglobin 15.9  13.0 - 17.0 g/dL    HCT 16.1  09.6 - 04.5 %    MCV 92.9  78.0 - 100.0 fL    MCH 33.4  26.0 - 34.0 pg    MCHC 36.0  30.0 - 36.0 g/dL    RDW 40.9  81.1 - 91.4 %    Platelets 270  150 - 400 K/uL    Neutrophils Relative 45  43 - 77 %    Neutro Abs 2.9  1.7 - 7.7 K/uL    Lymphocytes Relative 45  12 - 46 %    Lymphs Abs 2.9  0.7 - 4.0 K/uL    Monocytes Relative 8  3 - 12 %    Monocytes Absolute 0.5  0.1 - 1.0 K/uL    Eosinophils Relative 2  0 - 5 %    Eosinophils Absolute 0.2  0.0 - 0.7 K/uL    Basophils Relative 1  0 - 1 %    Basophils Absolute 0.0  0.0 - 0.1 K/uL   HEPATITIS PANEL, ACUTE     Status: Normal (Preliminary result)   Collection Time   11/28/12  4:18 AM      Component Value Range Comment   Hepatitis B Surface Ag NEGATIVE  NEGATIVE    HCV Ab NEGATIVE  NEGATIVE    Hep A IgM PENDING  NEGATIVE    Hep B C IgM PENDING  NEGATIVE   VALPROIC ACID LEVEL     Status: Abnormal   Collection Time   11/28/12  4:18 AM      Component Value Range Comment   Valproic Acid Lvl 31.9 (*) 50.0 - 100.0 ug/mL   ETHANOL     Status: Normal   Collection Time   11/28/12  4:18 AM      Component Value Range Comment   Alcohol, Ethyl (B) <11  0 - 11 mg/dL   POCT I-STAT, CHEM 8     Status: Abnormal   Collection Time   11/28/12  4:29 AM      Component Value Range Comment   Sodium 143  135 - 145 mEq/L    Potassium 3.9  3.5 - 5.1 mEq/L    Chloride 104  96 - 112 mEq/L    BUN 5 (*) 6 - 23 mg/dL    Creatinine, Ser 7.82  0.50 - 1.35 mg/dL    Glucose, Bld 956 (*) 70 - 99 mg/dL    Calcium, Ion 2.13  0.86 - 1.23 mmol/L    TCO2 27  0 - 100  mmol/L    Hemoglobin 16.0  13.0 - 17.0  g/dL    HCT 16.1  09.6 - 04.5 %   URINE RAPID DRUG SCREEN (HOSP PERFORMED)     Status: Normal   Collection Time   11/28/12  4:35 AM      Component Value Range Comment   Opiates NONE DETECTED  NONE DETECTED    Cocaine NONE DETECTED  NONE DETECTED    Benzodiazepines NONE DETECTED  NONE DETECTED    Amphetamines NONE DETECTED  NONE DETECTED    Tetrahydrocannabinol NONE DETECTED  NONE DETECTED    Barbiturates NONE DETECTED  NONE DETECTED    Psychological Evaluations:  Assessment:   AXIS I:  Schizophrenia, paranoid type AXIS II: deferred AXIS III:   Past Medical History  Diagnosis Date  . Drug abuse     xanax addiction 5 years ago  . Seizures   . Alcoholism   . Medical non-compliance   AXIS IV:  problems related to social environment and problems with primary support group AXIS V:  51-60 moderate symptoms  Treatment Plan/Recommendations:  1. Admit for crisis management and stabilization. 2. Medication management to reduce current symptoms to base line and improve the patient's overall level of functioning 3. Treat health problems as indicated. 4. Develop treatment plan to decrease risk of relapse upon discharge and the need for readmission. 5. Psycho-social education regarding relapse prevention and self care. 6. Health care follow up as needed for medical problems. 7. Restart home medications where appropriate.   Treatment Plan Summary: Daily contact with patient to assess and evaluate symptoms and progress in treatment Medication management Current Medications:  No current facility-administered medications for this encounter.    Observation Level/Precautions:  routine  Laboratory:    Psychotherapy:    Medications:  Will restart medications on last admission.    Consultations:    Discharge Concerns:    Estimated LOS: 3-5 days depending on response to medication.  Other:  Medications were verified at [facility].   I  certify that inpatient services furnished can reasonably be expected to improve the patient's condition.   Rona Ravens. Kolbee Bogusz PAC 1/31/20141:52 PM

## 2012-11-28 NOTE — ED Notes (Addendum)
Per Pt, pt states "the sensors in his body are feeling heavy." Pt is talking very randomly, and having loose associations. Pt states that he hasn't slept in 2 days and then went to sleep, and felt like he was dieing. Pt has hx of schizophrenia.

## 2012-11-28 NOTE — ED Notes (Signed)
Pt is laying down in bed resting comfortably at this time.

## 2012-11-28 NOTE — BH Assessment (Signed)
Assessment Note   Collins Kerby is an 37 y.o. male that presented to [facility] on his own with psychosis and delusional thinking.  Pt is not oriented and is grossly psychotic.  Pt was unable to give any detailed information, so most assessment information comes from the records and his emergency contact (his mother, Frederico Gerling).  Pt's mother reports that pt was last hospitalized at [facility] and released on December 24th, but she believes with no f/u planned.  Pt lives alone and may be non-compliant with medications anyway.  Pt sees Dr. Betti Cruz 2x a year for medication management, per report.  Pt has also been hospitalized at [facility] several times for psychosis.  There is a prevelent hx of Schizophrenia in the family, including Aunt's and Uncles.  Pt is also a former substance abuser, but denies current use and his UDS is clean.  Pt's mother reports that she tries to be supportive, but pt often breaks contact.  He got rid of his cell phone because the voices told him to break contact with the world.  Pt has had to be committed and was exhibiting pressured and tangential speech upon admission.  Pt will need inpatient treatment for stabilization and medication management.    Axis I: Schizophrenia, Paranoid Type Axis II: Deferred Axis III:  Past Medical History  Diagnosis Date  . Paranoid schizophrenia   . Drug abuse     xanax addiction 5 years ago  . Alcoholism   . Seizures    Axis IV: housing problems, other psychosocial or environmental problems, problems related to social environment, problems with primary support group and chronic mental illness Axis V: 21-30 behavior considerably influenced by delusions or hallucinations OR serious impairment in judgment, communication OR inability to function in almost all areas  Past Medical History:  Past Medical History  Diagnosis Date  . Paranoid schizophrenia   . Drug abuse     xanax addiction 5 years ago  . Alcoholism   . Seizures      History reviewed. No pertinent past surgical history.  Family History: No family history on file.  Social History:  reports that he has quit smoking. He does not have any smokeless tobacco history on file. He reports that he does not drink alcohol or use illicit drugs.  Additional Social History:  Alcohol / Drug Use Pain Medications: See MAR Prescriptions: See MAR Over the Counter: See MAR History of alcohol / drug use?: Yes (history of, but none currently) Longest period of sobriety (when/how long): per mother, pt has been sober for past five years +  CIWA: CIWA-Ar BP: 106/59 mmHg Pulse Rate: 83  COWS:    Allergies: No Known Allergies  Home Medications:  (Not in a hospital admission)  OB/GYN Status:  No LMP for male patient.  General Assessment Data Location of Assessment: [facility] ED Living Arrangements: Alone Can pt return to current living arrangement?: No Admission Status: Involuntary Is patient capable of signing voluntary admission?: No Transfer from: Acute Hospital Referral Source: Self/Family/Friend  Education Status Is patient currently in school?: No  Risk to self Suicidal Ideation: No Suicidal Intent: No Is patient at risk for suicide?: No Suicidal Plan?: No Access to Means: No What has been your use of drugs/alcohol within the last 12 months?: hx of, but not currently Previous Attempts/Gestures: No How many times?: 0  Other Self Harm Risks: unpredictable and psychotic Triggers for Past Attempts: Unpredictable Intentional Self Injurious Behavior: None Family Suicide History: No Recent stressful life event(s):  Conflict (Comment);Turmoil (Comment) Persecutory voices/beliefs?: Yes Depression: No Substance abuse history and/or treatment for substance abuse?: Yes Suicide prevention information given to non-admitted patients: Not applicable  Risk to Others Homicidal Ideation: No Thoughts of Harm to Others: No Current Homicidal Intent: No Current  Homicidal Plan: No Access to Homicidal Means: No Identified Victim: none per pt History of harm to others?: No Assessment of Violence: None Noted Violent Behavior Description: none per pt Does patient have access to weapons?: No Criminal Charges Pending?: No Does patient have a court date: No  Psychosis Hallucinations: Auditory;Visual;With command Delusions: Unspecified  Mental Status Report Appear/Hygiene: Disheveled;Poor hygiene;Bizarre Eye Contact: Poor Motor Activity: Unsteady Speech: Incoherent;Tangential Level of Consciousness: Sleeping;Irritable Mood: Suspicious;Irritable Affect: Inconsistent with thought content;Preoccupied Anxiety Level: None Thought Processes: Irrelevant;Circumstantial;Tangential;Flight of Ideas Judgement: Impaired Orientation: Not oriented Obsessive Compulsive Thoughts/Behaviors: Severe  Cognitive Functioning Concentration: Decreased Memory: Recent Impaired;Remote Impaired IQ:  (unknown) Insight: Poor Impulse Control: Poor Appetite: Poor Weight Loss: 0  Weight Gain: 0  Sleep: Decreased Total Hours of Sleep:  (had to be sedated to sleep) Vegetative Symptoms: None  ADLScreening [facility] Assessment Services) Patient's cognitive ability adequate to safely complete daily activities?: Yes Patient able to express need for assistance with ADLs?: Yes Independently performs ADLs?: Yes (appropriate for developmental age)  Abuse/Neglect [facility]) Physical Abuse: Denies Verbal Abuse: Denies Sexual Abuse: Denies  Prior Inpatient Therapy Prior Inpatient Therapy: Yes Prior Therapy Dates: 2013 and prior Prior Therapy Facilty/Provider(s): [place], [facility] x[facility], and others Reason for Treatment: psychosis  Prior Outpatient Therapy Prior Outpatient Therapy: Yes Prior Therapy Dates: currently Prior Therapy Facilty/Provider(s): Dr. Betti Cruz Reason for Treatment: psychosis  ADL Screening (condition at time of admission) Patient's cognitive ability adequate  to safely complete daily activities?: Yes Patient able to express need for assistance with ADLs?: Yes Independently performs ADLs?: Yes (appropriate for developmental age)       Abuse/Neglect Assessment (Assessment to be complete while patient is alone) Physical Abuse: Denies Verbal Abuse: Denies Sexual Abuse: Denies Exploitation of patient/patient's resources: Denies Self-Neglect: Denies Values / Beliefs Cultural Requests During Hospitalization: None Spiritual Requests During Hospitalization: None   Advance Directives (For Healthcare) Advance Directive: Not applicable, patient <44 years old    Additional Information 1:1 In Past 12 Months?: Yes CIRT Risk: Yes Elopement Risk: Yes Does patient have medical clearance?: Yes     Disposition:  Please run for possible inpatient treatment.    On Site Evaluation by:   Reviewed with Physician:     Angelica Ran 11/28/2012 9:06 AM

## 2012-11-28 NOTE — Progress Notes (Signed)
Psychoeducational Group Note  Date:  11/28/2012 Time:  2000  Group Topic/Focus:  Wrap-Up Group:   The focus of this group is to help patients review their daily goal of treatment and discuss progress on daily workbooks.  Participation Level: Did Not Attend  Participation Quality:  Not Applicable  Affect:  Not Applicable  Cognitive:  Not Applicable  Insight:  Not Applicable  Engagement in Group: Not Applicable  Additional Comments:    Flonnie Hailstone 11/28/2012, 9:17 PM

## 2012-11-29 NOTE — Progress Notes (Signed)
Psychoeducational Group Note  Date:  11/29/2012 Time:  0945 am  Group Topic/Focus:  Identifying Needs:   The focus of this group is to help patients identify their personal needs that have been historically problematic and identify healthy behaviors to address their needs.  Participation Level:  Did Not Attend   Andrena Mews 11/29/2012,11:24 AM

## 2012-11-29 NOTE — Progress Notes (Signed)
Psychoeducational Group Note  Date:  11/29/2012 Time:1000am  Group Topic/Focus:  Identifying Needs:   The focus of this group is to help patients identify their personal needs that have been historically problematic and identify healthy behaviors to address their needs.  Participation Level:  Did Not Attend  Participation Quality:    Affect:   Cognitive:   Insight: Engagement in Group:  Additional Comments:  Inventory sheet   Valente David 11/29/2012,10:00 AM

## 2012-11-29 NOTE — Progress Notes (Signed)
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 He is difficult to redirect at this time as he wishes to explain in entirety the elaborate network involved in the child trafficking network and the plot.   Per EMR the patient has had multiple admission to [facility] with his last admission in March 2013.  Diagnosis:  Axis I: Schizophrenia, paranoid type  ADL's:  Intact  Sleep: Poor  Appetite:  Fair  Suicidal Ideation:  Plan:  Patient denies. Intent:  Patient denies. Means:  Patient denies.  Homicidal Ideation:  Plan:  Patient denies. Intent:  Patient denies. Means:  Patient denies. AEB (as evidenced by):  Psychiatric Specialty Exam: Review of Systems  Unable to perform ROS: psychiatric disorder    Blood pressure 120/84, pulse 99, temperature 98 F (36.7 C), temperature source Oral, resp. rate 18, height 5' 9.5" (1.765 m), weight 107.049 kg (236 lb), SpO2 96.00%.Body mass index is 34.35 kg/(m^2).  General Appearance: Disheveled  Eye Contact::  Minimal  Speech:  Pressured  Volume:  Increased  Mood:  Euthymic   Affect:  Congruent and Restricted  Thought Process:  Circumstantial  Orientation:  Full (Time, Place, and Person)  Thought Content:  Delusions and Paranoid Ideation  Suicidal Thoughts:  No  Homicidal Thoughts:  No  Memory:  Immediate;   Good Recent;   Good  Judgement:  Poor  Insight:  Lacking  Psychomotor Activity:  Normal  Concentration:  Fair  Recall:  Fair  Akathisia:  No  Handed:  Left  AIMS (if indicated):   As noted below.  Assets:  Desire for Improvement  Sleep:  Number of Hours: 5    Current Medications: Current Facility-Administered Medications  Medication Dose Route Frequency Provider Last Rate Last Dose  . acetaminophen (TYLENOL) tablet 650 mg  650 mg Oral Q6H PRN Shuvon Rankin, NP      . alum & mag  hydroxide-simeth (MAALOX/MYLANTA) 200-200-20 MG/5ML suspension 30 mL  30 mL Oral Q4H PRN Shuvon Rankin, NP      . divalproex (DEPAKOTE ER) 24 hr tablet 1,500 mg  1,500 mg Oral QHS Shuvon Rankin, NP      . hydrOXYzine (ATARAX/VISTARIL) tablet 50 mg  50 mg Oral TID PRN Verne Spurr, PA-C      . magnesium hydroxide (MILK OF MAGNESIA) suspension 30 mL  30 mL Oral Daily PRN Shuvon Rankin, NP      . QUEtiapine (SEROQUEL XR) 24 hr tablet 400 mg  400 mg Oral QPM Verne Spurr, PA-C   400 mg at 11/28/12 1721    Lab Results:  Results for orders placed during the hospital encounter of 11/28/12 (from the past 48 hour(s))  CBC WITH DIFFERENTIAL     Status: Normal   Collection Time   11/28/12  4:18 AM      Component Value Range Comment   WBC 6.4  4.0 - 10.5 K/uL    RBC 4.76  4.22 - 5.81 MIL/uL    Hemoglobin 15.9  13.0 - 17.0 g/dL    HCT 40.9  81.1 - 91.4 %    MCV 92.9  78.0 - 100.0 fL    MCH 33.4  26.0 - 34.0 pg    MCHC 36.0  30.0 - 36.0 g/dL    RDW 78.2  95.6 - 21.3 %    Platelets 270  150 - 400 K/uL    Neutrophils Relative  45  43 - 77 %    Neutro Abs 2.9  1.7 - 7.7 K/uL    Lymphocytes Relative 45  12 - 46 %    Lymphs Abs 2.9  0.7 - 4.0 K/uL    Monocytes Relative 8  3 - 12 %    Monocytes Absolute 0.5  0.1 - 1.0 K/uL    Eosinophils Relative 2  0 - 5 %    Eosinophils Absolute 0.2  0.0 - 0.7 K/uL    Basophils Relative 1  0 - 1 %    Basophils Absolute 0.0  0.0 - 0.1 K/uL   HEPATITIS PANEL, ACUTE     Status: Normal   Collection Time   11/28/12  4:18 AM      Component Value Range Comment   Hepatitis B Surface Ag NEGATIVE  NEGATIVE    HCV Ab NEGATIVE  NEGATIVE    Hep A IgM NEGATIVE  NEGATIVE    Hep B C IgM NEGATIVE  NEGATIVE   VALPROIC ACID LEVEL     Status: Abnormal   Collection Time   11/28/12  4:18 AM      Component Value Range Comment   Valproic Acid Lvl 31.9 (*) 50.0 - 100.0 ug/mL   ETHANOL     Status: Normal   Collection Time   11/28/12  4:18 AM      Component Value Range Comment    Alcohol, Ethyl (B) <11  0 - 11 mg/dL   POCT I-STAT, CHEM 8     Status: Abnormal   Collection Time   11/28/12  4:29 AM      Component Value Range Comment   Sodium 143  135 - 145 mEq/L    Potassium 3.9  3.5 - 5.1 mEq/L    Chloride 104  96 - 112 mEq/L    BUN 5 (*) 6 - 23 mg/dL    Creatinine, Ser 1.61  0.50 - 1.35 mg/dL    Glucose, Bld 096 (*) 70 - 99 mg/dL    Calcium, Ion 0.45  4.09 - 1.23 mmol/L    TCO2 27  0 - 100 mmol/L    Hemoglobin 16.0  13.0 - 17.0 g/dL    HCT 81.1  91.4 - 78.2 %   URINE RAPID DRUG SCREEN (HOSP PERFORMED)     Status: Normal   Collection Time   11/28/12  4:35 AM      Component Value Range Comment   Opiates NONE DETECTED  NONE DETECTED    Cocaine NONE DETECTED  NONE DETECTED    Benzodiazepines NONE DETECTED  NONE DETECTED    Amphetamines NONE DETECTED  NONE DETECTED    Tetrahydrocannabinol NONE DETECTED  NONE DETECTED    Barbiturates NONE DETECTED  NONE DETECTED     Physical Findings: AIMS: Facial and Oral Movements Muscles of Facial Expression: None, normal Lips and Perioral Area: None, normal Jaw: None, normal Tongue: None, normal,Extremity Movements Upper (arms, wrists, hands, fingers): None, normal Lower (legs, knees, ankles, toes): None, normal, Trunk Movements Neck, shoulders, hips: None, normal, Overall Severity Severity of abnormal movements (highest score from questions above): None, normal Incapacitation due to abnormal movements: None, normal Patient's awareness of abnormal movements (rate only patient's report): No Awareness, Dental Status Current problems with teeth and/or dentures?: No Does patient usually wear dentures?: No  CIWA:  CIWA-Ar Total: 1  COWS:     Treatment Plan Summary: Daily contact with patient to assess and evaluate symptoms and progress in treatment Medication management Continue to  monitor in a safe secure environment.   Plan: 1. Continue inpatient treatment for crisis management and stabilization.  2. Medication  management to reduce current symptoms to base line and improve the patient's overall level of functioning.  A) Will continue Seroquel XR at 400 mg daily B) Will continue Depakote ER at 1500 mg daily C) Continue PRN medications. 3. Treat health problems as indicated.  4. Develop treatment plan to decrease risk of relapse upon discharge and the need for readmission.  5. Psycho-social education regarding relapse prevention and self care.  6. Health care follow up as needed for medical problems.  7. Continue home medications where appropriate.  8. If patient continues to refuse medications, agree with force medication.    Medical Decision Making Problem Points:  Established problem, worsening (2) Data Points:  Order Aims Assessment (2) Review or order clinical lab tests (1) Review and summation of old records (2) Review of medication regiment & side effects (2)  I certify that inpatient services furnished can reasonably be expected to improve the patient's condition.   Cynde Menard 11/29/2012, 9:53 AM

## 2012-11-29 NOTE — Progress Notes (Signed)
D   Pt has been in bed all morning  He responds to staff trying to wake him but he hasnt come out of the room   He mumbles then rolls over and goes back to sleep   He denies suicidal and homicidal ideation at present   A   Verbal support given  Medications offered   Q 15 min checks R   Pt safe at continues to isolate

## 2012-11-29 NOTE — Clinical Social Work Psychosocial (Signed)
[facility] Group Notes: (Clinical Social Work)   11/29/2012      Type of Therapy:  Group Therapy   Participation Level:  Did Not Attend    Raechal Raben Grossman-Orr, LCSW 11/29/2012, 12:32 PM     

## 2012-11-29 NOTE — Progress Notes (Signed)
Patient ID: Albert [name], male   DOB: April 08, 1976, 37 y.o.   MRN: [id number] D)   Was sleeping at the time group was being held, and didn't attend this evening.  Came out to the dayroom afterward and looked disheveled and tired, got a snack however..  Asked if he was ready for his hs meds,he started to become agitated and stated he had taken his meds already and wasn't going to take anything else tonight.  Initially tried to coax him but was becoming more insistent that he wasn't taking anything else tonight.  Didn't push the issue at this time .  A)  Will continue to monitor q 15 minutes for safety , encourage group, med compliance,  R)  Safety maintained.

## 2012-11-29 NOTE — Progress Notes (Signed)
Adult Psychoeducational Group Note  Date:  11/29/2012 Time:  8:56 PM  Group Topic/Focus:  Wrap-Up Group:   The focus of this group is to help patients review their daily goal of treatment and discuss progress on daily workbooks.  Participation Level:  Active  Participation Quality:  Sharing  Affect:  Labile  Cognitive:  Oriented  Insight: Lacking  Engagement in Group:  Engaged  Modes of Intervention:  Support  Additional Comments:  Patient attended and participated in group tonight. He reports having a good day. He wants to catch up on sleep today. His roommate and himself is working through the temperature in the room. He talked to the doctor today, went to his meals, attended groups. The cope her talk to friends.  Lita Mains [facility] 11/29/2012, 8:56 PM

## 2012-11-30 DIAGNOSIS — F200 Paranoid schizophrenia: Principal | ICD-10-CM

## 2012-11-30 MED ORDER — QUETIAPINE FUMARATE 400 MG PO TABS
400.0000 mg | ORAL_TABLET | Freq: Once | ORAL | Status: AC
  Administered 2012-11-30: 400 mg via ORAL
  Filled 2012-11-30 (×2): qty 1

## 2012-11-30 MED ORDER — QUETIAPINE FUMARATE ER 200 MG PO TB24
200.0000 mg | ORAL_TABLET | Freq: Every day | ORAL | Status: DC
  Filled 2012-11-30 (×3): qty 1

## 2012-11-30 MED ORDER — QUETIAPINE FUMARATE 100 MG PO TABS: 100.0000 mg | ORAL_TABLET | Freq: Once | ORAL | Status: AC | PRN

## 2012-11-30 MED ORDER — QUETIAPINE FUMARATE ER 300 MG PO TB24
300.0000 mg | ORAL_TABLET | Freq: Every day | ORAL | Status: DC
  Filled 2012-11-30 (×3): qty 1

## 2012-11-30 MED ORDER — QUETIAPINE FUMARATE ER 300 MG PO TB24: 600.0000 mg | ORAL_TABLET | Freq: Every day | ORAL | Status: DC

## 2012-11-30 NOTE — Clinical Social Work Psychosocial (Signed)
[facility] Group Notes: (Clinical Social Work)   11/30/2012      Type of Therapy:  Group Therapy   Participation Level:  Did Not Attend    Ambrose Mantle, LCSW 11/30/2012, 12:39 PM

## 2012-11-30 NOTE — Progress Notes (Signed)
D   Pt has spent most of the day in bed asleep  He does get up to go to the cafeteria for meals  He has not attended any groups this morning   He interacts minimally   He looks tense and constricted and has some difficulty processing simple questions   A   Verbal support given  Medications administered and effectiveness monitroed  Q 15 min checks R   Pt safe at present

## 2012-11-30 NOTE — Progress Notes (Signed)
Psychoeducational Group Note  Date:  11/30/2012 Time:  0945 am  Group Topic/Focus:  Making Healthy Choices:   The focus of this group is to help patients identify negative/unhealthy choices they were using prior to admission and identify positive/healthier coping strategies to replace them upon discharge.  Participation Level:  Did Not Attend  Richard Ritchey J 11/30/2012, 10:29 AM  

## 2012-11-30 NOTE — Progress Notes (Signed)
[facility] MD Progress Note  11/30/2012 5:09 PM Albert [name]  MRN:  [id number]  Subjective: Patient endorses continued delusions, he believes his name is Dorena Dew and his birthdate is 08/16/1987. He is lying in bed and was sleeping when the provider came to his room.  He ruminates about a conspiracy in his Church.  He reports he is taking his medications and denies any side effects.   Diagnosis: Axis I: Schizophrenia, paranoid type  ADL's: Intact  Sleep: Fair Appetite: Fair   Suicidal Ideation:  Plan: Patient denies.  Intent: Patient denies.  Means: Patient denies.  Homicidal Ideation:  Plan: Patient denies.  Intent: Patient denies.  Means: Patient denies.   AEB (as evidenced by): Delusional thinking.  Psychiatric Specialty Exam: Review of Systems  Unable to perform ROS: psychiatric disorder    Blood pressure 120/84, pulse 99, temperature 98 F (36.7 C), temperature source Oral, resp. rate 18, height 5' 9.5" (1.765 m), weight 107.049 kg (236 lb), SpO2 96.00%.Body mass index is 34.35 kg/(m^2).  General Appearance: Disheveled  Eye Contact::  Poor  Speech:  Slow  Volume:  Increased  Mood:  Irritable  Affect:  Blunt  Thought Process:  Tangential  Orientation:  Full (Time, Place, and Person)  Thought Content:  Delusions  Suicidal Thoughts:  No  Homicidal Thoughts:  No  Memory:  Immediate;   Good Recent;   Fair Remote;   Fair  Judgement:  Poor  Insight:  Poor  Psychomotor Activity:  Normal  Concentration:  Fair  Recall:  Fair  Akathisia:  Negative  Handed:  Left  AIMS (if indicated):   As noted Below  Assets:  Desire for Improvement  Sleep:  Number of Hours: 3.5    Current Medications: Current Facility-Administered Medications  Medication Dose Route Frequency Provider Last Rate Last Dose  . acetaminophen (TYLENOL) tablet 650 mg  650 mg Oral Q6H PRN Shuvon Rankin, NP      . alum & mag hydroxide-simeth (MAALOX/MYLANTA) 200-200-20 MG/5ML suspension 30 mL   30 mL Oral Q4H PRN Shuvon Rankin, NP      . divalproex (DEPAKOTE ER) 24 hr tablet 1,500 mg  1,500 mg Oral QHS Shuvon Rankin, NP   1,500 mg at 11/29/12 2201  . hydrOXYzine (ATARAX/VISTARIL) tablet 50 mg  50 mg Oral TID PRN Verne Spurr, PA-C      . magnesium hydroxide (MILK OF MAGNESIA) suspension 30 mL  30 mL Oral Daily PRN Shuvon Rankin, NP      . QUEtiapine (SEROQUEL XR) 24 hr tablet 400 mg  400 mg Oral QPM Verne Spurr, PA-C   400 mg at 11/29/12 1841    Lab Results: No results found for this or any previous visit (from the past 48 hour(s)).  Physical Findings: AIMS: Facial and Oral Movements Muscles of Facial Expression: None, normal Lips and Perioral Area: None, normal Jaw: None, normal Tongue: None, normal,Extremity Movements Upper (arms, wrists, hands, fingers): None, normal Lower (legs, knees, ankles, toes): None, normal, Trunk Movements Neck, shoulders, hips: None, normal, Overall Severity Severity of abnormal movements (highest score from questions above): None, normal Incapacitation due to abnormal movements: None, normal Patient's awareness of abnormal movements (rate only patient's report): No Awareness, Dental Status Current problems with teeth and/or dentures?: No Does patient usually wear dentures?: No  CIWA:  CIWA-Ar Total: 1  COWS:     Treatment Plan Summary: Daily contact with patient to assess and evaluate symptoms and progress in treatment Medication management  Plan:  1. Continue inpatient  treatment for crisis management and stabilization.  2. Medication management to reduce current symptoms to base line and improve the patient's overall level of functioning.  A) Will increase Seroquel XR at 500 mg daily  B) Will continue Depakote ER at 1500 mg daily  C) Continue PRN medications.  D) Will order depakote level in AM 3. Treat health problems as indicated.  4. Develop treatment plan to decrease risk of relapse upon discharge and the need for readmission.  5.  Psycho-social education regarding relapse prevention and self care.  6. Health care follow up as needed for medical problems.  7. Continue home medications where appropriate.  8. If patient continues to refuse medications, agree with force medication.   Medical Decision Making Problem Points:  Established problem, worsening (2) and Review of psycho-social stressors (1) Data Points:  Order Aims Assessment (2) Review or order clinical lab tests (1) Review of medication regiment & side effects (2) Review of new medications or change in dosage (2)  I certify that inpatient services furnished can reasonably be expected to improve the patient's condition.   Albert [name] 11/30/2012, 5:09 PM
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
## 2012-11-30 NOTE — Progress Notes (Signed)
[facility] Group Notes:  (Nursing/MHT/Case Management/Adjunct)  Date:  11/30/2012  Time:  11:07 AM  Type of Therapy:  self inventory  Participation Level:  Did Not Attend   Andrena Mews 11/30/2012, 11:07 AM

## 2012-12-01 MED ORDER — QUETIAPINE FUMARATE ER 400 MG PO TB24
400.0000 mg | ORAL_TABLET | Freq: Every day | ORAL | Status: DC
  Administered 2012-12-01 – 2012-12-02 (×2): 400 mg via ORAL
  Filled 2012-12-01 (×3): qty 1

## 2012-12-01 MED ORDER — OLANZAPINE 10 MG PO TBDP
10.0000 mg | ORAL_TABLET | Freq: Three times a day (TID) | ORAL | Status: DC | PRN
  Administered 2012-12-06: 10 mg via ORAL
  Filled 2012-12-01: qty 1
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
## 2012-12-01 NOTE — Progress Notes (Signed)
Patient ID: Albert [name], male   DOB: June 24, 1976, 37 y.o.   MRN: [id number] [facility] MD Progress Note  12/01/2012 10:54 AM Albert [name]  MRN:  [id number]  Subjective: Patient remains delusional, argumentative, irritable and labile. Patient continue to say his name is Albert [name] and his birthdate is 08/16/1987. He is socially withdrawn, internally pre-occupied and reporting tactile hallucinations. He is partially compliant with his medications. "I only wants to take Seroquel XR maximum 400mg  daily and you have to do whatever I wants to do".   Diagnosis: Axis I: Schizophrenia, paranoid type  ADL's: Intact  Sleep: Fair Appetite: Fair   Suicidal Ideation:  Plan: Patient denies.  Intent: Patient denies.  Means: Patient denies.  Homicidal Ideation:  Plan: Patient denies.  Intent: Patient denies.  Means: Patient denies.   AEB (as evidenced by): Delusional thinking.  Psychiatric Specialty Exam: Review of Systems  Constitutional: Negative.   HENT: Negative.   Eyes: Negative.   Respiratory: Negative.   Cardiovascular: Negative.   Gastrointestinal: Negative.   Genitourinary: Negative.   Musculoskeletal: Negative.   Skin: Negative.   Neurological: Negative.   Endo/Heme/Allergies: Negative.   Psychiatric/Behavioral: Positive for hallucinations. The patient is nervous/anxious.     Blood pressure 122/76, pulse 97, temperature 96.8 F (36 C), temperature source Oral, resp. rate 18, height 5' 9.5" (1.765 m), weight 107.049 kg (236 lb), SpO2 96.00%.Body mass index is 34.35 kg/(m^2).  General Appearance: Disheveled  Eye Contact::  Poor  Speech:  Slow  Volume:  Increased  Mood:  Irritable  Affect:  Blunt  Thought Process:  Tangential  Orientation:  Full (Time, Place, and Person)  Thought Content:  Delusions  Suicidal Thoughts:  No  Homicidal Thoughts:  No  Memory:  Immediate;   Good Recent;   Fair Remote;   Fair  Judgement:  Poor  Insight:  Poor  Psychomotor  Activity:  Normal  Concentration:  Fair  Recall:  Fair  Akathisia:  Negative  Handed:  Left  AIMS (if indicated):   As noted Below  Assets:  Desire for Improvement  Sleep:  Number of Hours: 5.75    Current Medications: Current Facility-Administered Medications  Medication Dose Route Frequency Provider Last Rate Last Dose  . acetaminophen (TYLENOL) tablet 650 mg  650 mg Oral Q6H PRN Shuvon Rankin, NP      . alum & mag hydroxide-simeth (MAALOX/MYLANTA) 200-200-20 MG/5ML suspension 30 mL  30 mL Oral Q4H PRN Shuvon Rankin, NP      . divalproex (DEPAKOTE ER) 24 hr tablet 1,500 mg  1,500 mg Oral QHS Shuvon Rankin, NP   1,500 mg at 11/30/12 2126  . hydrOXYzine (ATARAX/VISTARIL) tablet 50 mg  50 mg Oral TID PRN Verne Spurr, PA-C      . magnesium hydroxide (MILK OF MAGNESIA) suspension 30 mL  30 mL Oral Daily PRN Shuvon Rankin, NP      . OLANZapine zydis (ZYPREXA) disintegrating tablet 10 mg  10 mg Oral Q8H PRN Quantavia Frith      . QUEtiapine (SEROQUEL XR) 24 hr tablet 400 mg  400 mg Oral QHS Derreon Consalvo        Lab Results: No results found for this or any previous visit (from the past 48 hour(s)).  Physical Findings: AIMS: Facial and Oral Movements Muscles of Facial Expression: None, normal Lips and Perioral Area: None, normal Jaw: None, normal Tongue: None, normal,Extremity Movements Upper (arms, wrists, hands, fingers): None, normal Lower (legs, knees, ankles, toes): None, normal, Trunk Movements Neck, shoulders, hips:  None, normal, Overall Severity Severity of abnormal movements (highest score from questions above): None, normal Incapacitation due to abnormal movements: None, normal Patient's awareness of abnormal movements (rate only patient's report): No Awareness, Dental Status Current problems with teeth and/or dentures?: No Does patient usually wear dentures?: No  CIWA:  CIWA-Ar Total: 1  COWS:     Treatment Plan Summary: Daily contact with patient to assess and  evaluate symptoms and progress in treatment Medication management  Plan:  1. Continue inpatient treatment for crisis management and stabilization.  2. Medication management to reduce current symptoms to base line and improve the patient's overall level of functioning.  A) Will increase Seroquel XR to 400 mg daily for delusions B) Will continue Depakote ER at 1500 mg daily at bedtime for mood. 3. Treat health problems as indicated.  4. Develop treatment plan to decrease risk of relapse upon discharge and the need for readmission.  5. Psycho-social education regarding relapse prevention and self care.  6. Health care follow up as needed for medical problems.  7. Continue home medications where appropriate.  8. If patient continues to refuse medications, agree with force medication.   Medical Decision Making Problem Points:  Established problem, worsening (2) and Review of psycho-social stressors (1) Data Points:  Order Aims Assessment (2) Review or order clinical lab tests (1) Review of medication regiment & side effects (2) Review of new medications or change in dosage (2)  I certify that inpatient services furnished can reasonably be expected to improve the patient's condition.   Freddie Dymek,MD 12/01/2012, 10:54 AM

## 2012-12-01 NOTE — Care Management Utilization Note (Signed)
PER STATE REGULATIONS 482.30  THIS CHART WAS REVIEWED FOR MEDICAL NECESSITY WITH RESPECT TO THE PATIENT'S ADMISSION/ DURATION OF STAY.  NEXT REVIEW DATE: 12/01/2012

## 2012-12-01 NOTE — Progress Notes (Signed)
Adult Psychoeducational Group Note  Date:  12/01/2012 Time:  11:00am  Group Topic/Focus:  Crisis Planning:   The purpose of this group is to help patients create a crisis plan for use upon discharge or in the future, as needed.  Participation Level:  Did Not Attend  Participation Quality:    Affect:    Cognitive:    Insight:   Engagement in Group:    Modes of Intervention:    Additional Comments:  Pt was sleeping all morning long.  Isla Pence M 12/01/2012, 6:42 PM

## 2012-12-01 NOTE — Progress Notes (Signed)
[facility] LCSW Aftercare Discharge Planning Group Note  12/01/2012 1:17 PM  Participation Quality:  Did not attend    [name] 12/01/2012, 1:17 PM

## 2012-12-01 NOTE — Treatment Plan (Signed)
Interdisciplinary Treatment Plan Update (Adult)  Date: 12/01/2012  Time Reviewed: 5:01 PM   Progress in Treatment: Attending groups: Yes Participating in groups: Yes Taking medication as prescribed: Yes Tolerating medication: Yes   Family/Significant other contact made: No   Patient understands diagnosis:  No  Limited insight Discussing patient identified problems/goals with staff:  Yes  See below Medical problems stabilized or resolved:  Yes Denies suicidal/homicidal ideation: Yes  In tx team Issues/concerns per patient self-inventory:  Not filled out Other:  New problem(s) identified: N/A  Reason for Continuation of Hospitalization: Hallucinations Medication stabilization  Interventions implemented related to continuation of hospitalization:  Seroquel/Depakote trial  Encourage group attendance and participation  Additional comments:  Estimated length of stay: 4-5 days  Discharge Plan: unknown  New goal(s): N/A  Review of initial/current patient goals per problem list:   1.  Goal(s): Eliminate psychosis with the use of medications/thaerapeutic milieu  Met:  No  Target date:2/7  As evidenced [id number] insists that he has an alias, is paranoid, and his thoughts are circumstantial and disorganized  2.  Goal (s):Stabilize mood with the use of medication/therapeutic milieu  Met:  No  Target date:2/7  As evidenced [id number] is labile and easily agitated  3.  Goal(s): Identify outpt provider  Met:  No  Target date:2/7  As evidenced [id number] of appointment time and date  4.  Goal(s):  Met:  No  Target date:  As evidenced by:  Attendees: Patient:     Family:     Physician:  Thedore Mins 12/01/2012 5:01 PM   Nursing:  Novella Rob  12/01/2012 5:01 PM   Clinical Social Worker:  Richelle Ito 12/01/2012 5:01 PM   Extender:  Verne Spurr PA 12/01/2012 5:01 PM   Other:     Other:     Other:     Other:      Scribe for Treatment Team:   [name], 12/01/2012 5:01 PM

## 2012-12-01 NOTE — Progress Notes (Signed)
D: Pt isolative, lethargic, and in bedroom except for to watch football game.  Appears preoccupied, continues to endorse tactile hallucinations involving "sensors going off" in his body.  Affect is constructed, mood anxious.  Speech pressured, tangential, difficult to follow much of time.  Thoughts characterized by tangentiality, flight-of-ideas, and thought blocking.  Paranoid delusions persist.  Denies SI/HI, AVH, and acute pain.  Contracting for safety on unit.  Refused HS Seroquel XR 500mg  PO, stating that he and MD had agreed that dose would not exceed 400mg .  Attended no unit groups today.  A: Given verbal support, encouraged to spend time on milieu and to remain awake in order to be able to sleep overnight.  One-time order for Seroquel 400mg  PO (immediate release) with available PRN of 100mg  obtained from on-call; Pt informed he will need to discuss dosage with MD tomorrow.  No PRNs given.  All medications administered according to med orders and POC.  Q15 minute safety checks maintained as per unit protocol.  R: Pt remains paranoid and delusional, experiencing tactile hallucinations.  Safety maintained. Dion Saucier RN
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
## 2012-12-02 MED ORDER — HALOPERIDOL 5 MG PO TABS
10.0000 mg | ORAL_TABLET | Freq: Once | ORAL | Status: AC
  Administered 2012-12-02: 10 mg via ORAL
  Filled 2012-12-02 (×2): qty 2

## 2012-12-02 MED ORDER — HALOPERIDOL LACTATE 5 MG/ML IJ SOLN
10.0000 mg | Freq: Once | INTRAMUSCULAR | Status: AC
  Filled 2012-12-02: qty 2

## 2012-12-02 MED ORDER — DIPHENHYDRAMINE HCL 50 MG/ML IJ SOLN
50.0000 mg | Freq: Once | INTRAMUSCULAR | Status: AC
  Filled 2012-12-02: qty 1

## 2012-12-02 MED ORDER — LORAZEPAM 1 MG PO TABS
2.0000 mg | ORAL_TABLET | Freq: Once | ORAL | Status: AC
  Administered 2012-12-02: 2 mg via ORAL
  Filled 2012-12-02: qty 2

## 2012-12-02 MED ORDER — LORAZEPAM 2 MG/ML IJ SOLN: 2.0000 mg | Freq: Once | INTRAMUSCULAR | Status: AC

## 2012-12-02 MED ORDER — PANTOPRAZOLE SODIUM 40 MG PO TBEC
40.0000 mg | DELAYED_RELEASE_TABLET | Freq: Every day | ORAL | Status: DC
  Administered 2012-12-02 – 2012-12-09 (×7): 40 mg via ORAL
  Filled 2012-12-02 (×12): qty 1

## 2012-12-02 MED ORDER — DIPHENHYDRAMINE HCL 50 MG PO CAPS
50.0000 mg | ORAL_CAPSULE | Freq: Once | ORAL | Status: AC
  Administered 2012-12-02: 50 mg via ORAL
  Filled 2012-12-02 (×2): qty 1

## 2012-12-02 NOTE — Progress Notes (Signed)
Adult Psychosocial Assessment Update Interdisciplinary Team  Previous [facility] admissions/discharges:  Admissions Discharges  Date:current Date:  Date:March of 2013 Date:  Date: Date:  Date: Date:  Date: Date:   Changes since the last Psychosocial Assessment (including adherence to outpatient mental health and/or substance abuse treatment, situational issues contributing to decompensation and/or relapse). Patient was released from psych hospital in late December of 2013.  He returned home, and may or may not have been compliant with his medication.  He became symptomatic with disorganized thinking, delusions and paranoia, and came to the ED seeking help.             Discharge Plan 1. Will you be returning to the same living situation after discharge?   Yes:X No:      If no, what is your plan?           2. Would you like a referral for services when you are discharged? Yes:     If yes, for what services?  No:       "Jesusita Oka" states he will follow up with Dr Betti Cruz.  Our recommendation is referral to ACT team based on number of recent hospitalizations and severity of symptoms and illness.       Summary and Recommendations (to be completed by the evaluator) 37 year old Caucasian male with a severe and persistent mental illness who has been hospitalized 3 times in the last 10 months.  Could benefit from a change in medications as he is currently refusing to take a higher dose of Seroquel than 400mg .  Also therapeutic milieu, and referral to ACT team if he will allow it.                        Signature:  [name], 12/02/2012 2:13 PM
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
## 2012-12-02 NOTE — Progress Notes (Signed)
The focus of this group is to help patients review their daily goal of treatment and discuss progress on daily workbooks. Pt attended the evening group session but was unable to answer prompts from the Writer due to his off-topic rambling. Pt appeared to have racing and disorganized thoughts. Pt did not maintain eye contact as he jumped from topic to topic, instead staring into the wall. Pt was also difficult to re-direct and twice monopolized the group.

## 2012-12-02 NOTE — Progress Notes (Signed)
[facility] LCSW Aftercare Discharge Planning Group Note  12/02/2012 2:08 PM  Participation Quality:  Did not attend   After group, I follow up with [name] to ask why he did not attend.  Stated that after I had informed him of group, he decided that group was not in his best interest today.  When I asked about permission to call his mother, he became upset, and reminded me that yesterday he had informed me she was killed in a plane crash in the late 90's, and why would I ask him about calling after I was told she was dead.  I explained i had received a call from a person stating they were his mother, and that I was seeking permission to call that person back.  He declared emphatically that his mother is dead, and that I not call this person back.  I assured him I had no intention of deceiving him or going against his wishes.  [name] 12/02/2012, 2:08 PM

## 2012-12-02 NOTE — Progress Notes (Signed)
Pt. Is resting comfortably at this time.  NO signs of discomfort  Or distress noted.
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
## 2012-12-02 NOTE — Progress Notes (Signed)
Adult Psychoeducational Group Note  Date:  12/02/2012 Time: 0930 Group Topic/Focus:  Recovery Goals:   The focus of this group is to identify appropriate goals for recovery and establish a plan to achieve them.  Participation Level:  None  Participation Quality:  Inattentive  Affect:  Flat  Cognitive:  Oriented  Insight: None  Engagement in Group:  None  Modes of Intervention:  Discussion and Education  Additional Comments:  Pt was in group but did not participate at all.  Saryah Loper E 12/02/2012, 1:25 PM

## 2012-12-02 NOTE — Progress Notes (Signed)
Psychoeducational Group Note  Date:  12/02/2012 Time:  2000  Group Topic/Focus:  Wrap-Up Group:   The focus of this group is to help patients review their daily goal of treatment and discuss progress on daily workbooks.  Participation Level: Did Not Attend  Participation Quality:  Not Applicable  Affect:  Not Applicable  Cognitive:  Not Applicable  Insight:  Not Applicable  Engagement in Group: Not Applicable  Additional Comments:  Pt did not attend group  Nechelle Petrizzo Lee 12/02/2012, 9:09 PM  

## 2012-12-02 NOTE — H&P (Signed)
Seen and agreed. Slayter Moorhouse, MD 

## 2012-12-03 MED ORDER — TRAZODONE HCL 50 MG PO TABS
150.0000 mg | ORAL_TABLET | Freq: Every evening | ORAL | Status: DC | PRN
  Filled 2012-12-03: qty 3

## 2012-12-03 MED ORDER — BENZTROPINE MESYLATE 1 MG PO TABS
1.0000 mg | ORAL_TABLET | Freq: Two times a day (BID) | ORAL | Status: DC
  Administered 2012-12-03 – 2012-12-05 (×4): 1 mg via ORAL
  Filled 2012-12-03 (×6): qty 1

## 2012-12-03 MED ORDER — HALOPERIDOL 5 MG PO TABS
10.0000 mg | ORAL_TABLET | Freq: Two times a day (BID) | ORAL | Status: DC
  Administered 2012-12-03 – 2012-12-09 (×12): 10 mg via ORAL
  Filled 2012-12-03 (×2): qty 2
  Filled 2012-12-03 (×2): qty 56
  Filled 2012-12-03 (×12): qty 2

## 2012-12-03 NOTE — Progress Notes (Signed)
Psychoeducational Group Note  Date:  12/03/2012 Time:  2000  Group Topic/Focus:  Wrap-Up Group:   The focus of this group is to help patients review their daily goal of treatment and discuss progress on daily workbooks.  Participation Level: Did Not Attend  Participation Quality:  Not Applicable  Affect:  Not Applicable  Cognitive:  Not Applicable  Insight:  Not Applicable  Engagement in Group: Not Applicable  Additional Comments:  Pt did not attend.  Christ Kick 12/03/2012, 9:04 PM

## 2012-12-03 NOTE — Progress Notes (Signed)
[facility] Group Notes:  (Counselor/Nursing/MHT/Case Management/Adjunct)  09/12/2012 12:00 PM  Type of Therapy:  Group Therapy  Participation Level:  Did Not Attend  Liya Strollo N 09/12/2012, 12:00 PM  

## 2012-12-03 NOTE — Progress Notes (Signed)
Patient ID: Albert [name], male   DOB: February 03, 1976, 37 y.o.   MRN: [id number]   D: Pt slept thru most of the shift. Pt came to get his meds and requested his seroquel. Writer informed pt that seroquel wasn't on his profile any longer. Asked pt if he had discussed his medication with his dr. Pt replied, "no". Stated that he spoke to his dr. Marland Kitchenonly briefly". Encouraged the pt to speak to his dr in ref to his care.  A: Support and encouragement was offered. 15 min checks continued for safety.  R: Pt remains safe.

## 2012-12-03 NOTE — Progress Notes (Signed)
Patient ID: Albert [name], male   DOB: 09-20-1976, 37 y.o.   MRN: [id number]  D: Pt was pleasant and cooperative. Informed pt that writer waited until he woke up to give him his meds. Pt voiced no complaints or concerns.   A: Support and encouragement was offered. 15 min checks continued for safety.  R: Pt remains safe.

## 2012-12-03 NOTE — Progress Notes (Signed)
  D) Patient irritable and sarcastic upon my assessment. Patient denies SI/HI, denies A/V hallucinations.   A) Patient offered support and encouragement, patient encouraged to discuss feelings/concerns with staff. Patient verbalized understanding. Patient monitored Q15 minutes for safety. Patient met with MD  to discuss today's goals and plan of care. Patient was verbally aggressive and angry with staff.   R) Patient isolates to room for most of this shift. Patient does attend meals in dining room. Patient irritable with staff.   Patient taking medications as ordered. Will continue to monitor.

## 2012-12-03 NOTE — Progress Notes (Signed)
Patient ID: Albert [name], male   DOB: 07/26/1976, 37 y.o.   MRN: [id number] [facility] MD Progress Note  12/03/2012 11:44 AM Albert [name]  MRN:  [id number]  Subjective: Patient remains labile, oppositional, delusional, irritable and easily agitated. He is socially withdrawn, internally pre-occupied and reporting tactile hallucinations. He is remains partially compliant with his medications.   Diagnosis: Axis I: Schizophrenia, paranoid type  ADL's: Intact  Sleep: Fair Appetite: Fair   Suicidal Ideation:  Plan: Patient denies.  Intent: Patient denies.  Means: Patient denies.  Homicidal Ideation:  Plan: Patient denies.  Intent: Patient denies.  Means: Patient denies.   AEB (as evidenced by): Delusional thinking.  Psychiatric Specialty Exam: Review of Systems  Constitutional: Negative.   HENT: Negative.   Eyes: Negative.   Respiratory: Negative.   Cardiovascular: Negative.   Gastrointestinal: Negative.   Genitourinary: Negative.   Musculoskeletal: Negative.   Skin: Negative.   Neurological: Negative.   Endo/Heme/Allergies: Negative.   Psychiatric/Behavioral: Positive for hallucinations. The patient is nervous/anxious.     Blood pressure 144/73, pulse 104, temperature 96.8 F (36 C), temperature source Oral, resp. rate 20, height 5' 9.5" (1.765 m), weight 107.049 kg (236 lb), SpO2 96.00%.Body mass index is 34.35 kg/(m^2).  General Appearance: Disheveled  Eye Contact::  Poor  Speech:  Slow  Volume:  Increased  Mood:  Irritable  Affect:  Blunt  Thought Process:  Tangential  Orientation:  Full (Time, Place, and Person)  Thought Content:  Delusions  Suicidal Thoughts:  No  Homicidal Thoughts:  No  Memory:  Immediate;   Good Recent;   Fair Remote;   Fair  Judgement:  Poor  Insight:  Poor  Psychomotor Activity:  Normal  Concentration:  Fair  Recall:  Fair  Akathisia:  Negative  Handed:  Left  AIMS (if indicated):   As noted Below  Assets:  Desire for  Improvement  Sleep:  Number of Hours: 6.25    Current Medications: Current Facility-Administered Medications  Medication Dose Route Frequency Provider Last Rate Last Dose  . acetaminophen (TYLENOL) tablet 650 mg  650 mg Oral Q6H PRN Shuvon Rankin, NP      . alum & mag hydroxide-simeth (MAALOX/MYLANTA) 200-200-20 MG/5ML suspension 30 mL  30 mL Oral Q4H PRN Shuvon Rankin, NP      . benztropine (COGENTIN) tablet 1 mg  1 mg Oral BID Myleen Brailsford      . divalproex (DEPAKOTE ER) 24 hr tablet 1,500 mg  1,500 mg Oral QHS Shuvon Rankin, NP   1,500 mg at 12/02/12 2257  . haloperidol (HALDOL) tablet 10 mg  10 mg Oral BID Chesley Veasey      . hydrOXYzine (ATARAX/VISTARIL) tablet 50 mg  50 mg Oral TID PRN Verne Spurr, PA-C      . magnesium hydroxide (MILK OF MAGNESIA) suspension 30 mL  30 mL Oral Daily PRN Shuvon Rankin, NP      . OLANZapine zydis (ZYPREXA) disintegrating tablet 10 mg  10 mg Oral Q8H PRN Izzabella Besse      . pantoprazole (PROTONIX) EC tablet 40 mg  40 mg Oral Daily Verne Spurr, PA-C   40 mg at 12/02/12 1843  . traZODone (DESYREL) tablet 150 mg  150 mg Oral QHS PRN Danice Dippolito        Lab Results: No results found for this or any previous visit (from the past 48 hour(s)).  Physical Findings: AIMS: Facial and Oral Movements Muscles of Facial Expression: None, normal Lips and Perioral Area: None, normal Jaw:  None, normal Tongue: None, normal,Extremity Movements Upper (arms, wrists, hands, fingers): None, normal Lower (legs, knees, ankles, toes): None, normal, Trunk Movements Neck, shoulders, hips: None, normal, Overall Severity Severity of abnormal movements (highest score from questions above): None, normal Incapacitation due to abnormal movements: None, normal Patient's awareness of abnormal movements (rate only patient's report): No Awareness, Dental Status Current problems with teeth and/or dentures?: No Does patient usually wear dentures?: No  CIWA:  CIWA-Ar  Total: 1  COWS:     Treatment Plan Summary: Daily contact with patient to assess and evaluate symptoms and progress in treatment Medication management  Plan:  1. Continue inpatient treatment for crisis management and stabilization.  2. Medication management to reduce current symptoms to base line and improve the patient's overall level of functioning.  A) Will D/C Seroquel XR to 400 mg daily for delusions B) Will continue Depakote ER at 1500 mg daily at bedtime for mood. C) Will start patient on Haldol 10mg  po BID. 3. Treat health problems as indicated.  4. Develop treatment plan to decrease risk of relapse upon discharge and the need for readmission.  5. Psycho-social education regarding relapse prevention and self care.  6. Health care follow up as needed for medical problems.  7. Continue home medications where appropriate.  8. If patient continues to refuse medications, agree with force medication.   Medical Decision Making Problem Points:  Established problem, worsening (2) and Review of psycho-social stressors (1) Data Points:  Order Aims Assessment (2) Review or order clinical lab tests (1) Review of medication regiment & side effects (2) Review of new medications or change in dosage (2)  I certify that inpatient services furnished can reasonably be expected to improve the patient's condition.   Ashur Glatfelter,MD 12/03/2012, 11:44 AM

## 2012-12-03 NOTE — Progress Notes (Signed)
[facility] LCSW Aftercare Discharge Planning Group Note  12/03/2012 11:59 AM  Participation Quality:  Invited, but did not attend.  "I'm too tired."    [place]leenfort B 12/03/2012, 11:59 AM

## 2012-12-04 MED ORDER — MIRTAZAPINE 15 MG PO TBDP
15.0000 mg | ORAL_TABLET | Freq: Every day | ORAL | Status: DC
  Administered 2012-12-04: 15 mg via ORAL
  Filled 2012-12-04 (×3): qty 1

## 2012-12-04 NOTE — Progress Notes (Signed)
Patient ID: Albert [name], male   DOB: [birth year], 37 y.o.   MRN: [id number] [facility] MD Progress Note  12/04/2012 4:32 PM Kamryn Gauthier  MRN:  [id number]  Subjective:"I'm ready to take the higher dose of Seroquel now. I didn't sleep at all last night." Patient has a force medication order for his Haldol and has had 2 doses so far. He states he is having side effects and feels restless, but was noted to have slept a great deal yesterday afternoon.  He is focused on his Seroquel and wants to bargain for it. Diagnosis: Axis I: Schizophrenia, paranoid type  ADL's: Intact  Sleep: Fair Appetite: Fair   Suicidal Ideation:  Plan: Patient denies.  Intent: Patient denies.  Means: Patient denies.  Homicidal Ideation:  Plan: Patient denies.  Intent: Patient denies.  Means: Patient denies.   AEB (as evidenced by): Delusional thinking.  Psychiatric Specialty Exam: Review of Systems  Constitutional: Negative.   HENT: Negative.   Eyes: Negative.   Respiratory: Negative.   Cardiovascular: Negative.   Gastrointestinal: Negative.   Genitourinary: Negative.   Musculoskeletal: Negative.   Skin: Negative.   Neurological: Negative.   Endo/Heme/Allergies: Negative.   Psychiatric/Behavioral: Positive for hallucinations. The patient is nervous/anxious.     Blood pressure 121/91, pulse 101, temperature 97.1 F (36.2 C), temperature source Oral, resp. rate 16, height 5' 9.5" (1.765 m), weight 107.049 kg (236 lb), SpO2 96.00%.Body mass index is 34.35 kg/(m^2).  General Appearance: Disheveled  Eye Contact::  Poor  Speech:  Clear and goal directed  Volume:  Increased  Mood: less labile today  Affect:  Blunt  Thought Process:  Tangential  Orientation:  Full (Time, Place, and Person)  Thought Content:  Delusions  Suicidal Thoughts:  No  Homicidal Thoughts:  No  Memory:  Immediate;   Good Recent;   Fair Remote;   Fair  Judgement:  Poor  Insight:  Poor  Psychomotor Activity:  Normal   Concentration:  Fair  Recall:  Fair  Akathisia:  Negative  Handed:  Left  AIMS (if indicated):   As noted Below  Assets:  Desire for Improvement  Sleep:  Number of Hours: 3.75    Current Medications: Current Facility-Administered Medications  Medication Dose Route Frequency Provider Last Rate Last Dose  . acetaminophen (TYLENOL) tablet 650 mg  650 mg Oral Q6H PRN Shuvon Rankin, NP      . alum & mag hydroxide-simeth (MAALOX/MYLANTA) 200-200-20 MG/5ML suspension 30 mL  30 mL Oral Q4H PRN Shuvon Rankin, NP      . benztropine (COGENTIN) tablet 1 mg  1 mg Oral BID Mojeed Akintayo   1 mg at 12/04/12 0742  . divalproex (DEPAKOTE ER) 24 hr tablet 1,500 mg  1,500 mg Oral QHS Shuvon Rankin, NP   1,500 mg at 12/03/12 2220  . haloperidol (HALDOL) tablet 10 mg  10 mg Oral BID Mojeed Akintayo   10 mg at 12/04/12 0742  . hydrOXYzine (ATARAX/VISTARIL) tablet 50 mg  50 mg Oral TID PRN Verne Spurr, PA-C   50 mg at 12/04/12 0340  . magnesium hydroxide (MILK OF MAGNESIA) suspension 30 mL  30 mL Oral Daily PRN Shuvon Rankin, NP      . OLANZapine zydis (ZYPREXA) disintegrating tablet 10 mg  10 mg Oral Q8H PRN Mojeed Akintayo      . pantoprazole (PROTONIX) EC tablet 40 mg  40 mg Oral Daily Verne Spurr, PA-C   40 mg at 12/04/12 4401  . traZODone (DESYREL) tablet 150 mg  150  mg Oral QHS PRN Mojeed Akintayo        Lab Results: No results found for this or any previous visit (from the past 48 hour(s)).  Physical Findings: AIMS: Facial and Oral Movements Muscles of Facial Expression: None, normal Lips and Perioral Area: None, normal Jaw: None, normal Tongue: None, normal,Extremity Movements Upper (arms, wrists, hands, fingers): None, normal Lower (legs, knees, ankles, toes): None, normal, Trunk Movements Neck, shoulders, hips: None, normal, Overall Severity Severity of abnormal movements (highest score from questions above): None, normal Incapacitation due to abnormal movements: None, normal Patient's  awareness of abnormal movements (rate only patient's report): No Awareness, Dental Status Current problems with teeth and/or dentures?: No Does patient usually wear dentures?: No  CIWA:  CIWA-Ar Total: 1  COWS:     Treatment Plan Summary: Daily contact with patient to assess and evaluate symptoms and progress in treatment Medication management  Plan:  1. Continue inpatient treatment for crisis management and stabilization.  2. Medication management to reduce current symptoms to base line and improve the patient's overall level of functioning.  A.) Continue Cogentin 1mg  po BID for side effects. (Doubt Akathesia) but will monitor. B) Will continue Depakote ER at 1500 mg daily at bedtime for mood. C) Will continue patient on Haldol 10mg  po BID. 3. Treat health problems as indicated.  4. Develop treatment plan to decrease risk of relapse upon discharge and the need for readmission.  5. Psycho-social education regarding relapse prevention and self care.  6. Health care follow up as needed for medical problems.  7. Continue home medications where appropriate.  8. Remeron 15mg  po at hs for sleep.  Medical Decision Making Problem Points:  Established problem, worsening (2) and Review of psycho-social stressors (1) Data Points:  Order Aims Assessment (2) Review or order clinical lab tests (1) Review of medication regiment & side effects (2) Review of new medications or change in dosage (2)  I certify that inpatient services furnished can reasonably be expected to improve the patient's condition.   Rona Ravens. Chesley Valls [facility] 12/04/2012 4:38 PM

## 2012-12-04 NOTE — Progress Notes (Signed)
Adult Psychoeducational Group Note  Date:  12/04/2012 Time:  0930 Group Topic/Focus:  Rediscovering Joy:   The focus of this group is to explore various ways to relieve stress in a positive manner.  Participation Level:  Minimal  Participation Quality:  Appropriate  Affect:  Appropriate  Cognitive:  Appropriate  Insight: Limited  Engagement in Group:  Improving  Modes of Intervention:  Education  Additional Comments:  Pt did not engage with the group much, pt just sat quietly and did not share.  Albert [name] E 12/04/2012, 2:16 PM

## 2012-12-04 NOTE — Care Management Utilization Note (Signed)
   Per State Regulation 482.30  This chart was reviewed for necessity with respect to the patient's Admission/ Duration of stay.  Next review date: NRD: 12/08/12  Nicolasa Ducking RN, BSN

## 2012-12-04 NOTE — Progress Notes (Signed)
Patient ID: Albert [name], male   DOB: December 27, 1975, 37 y.o.   MRN: [id number]  D: Pt denies SI/HI/AVH. Pt says he was tired today and just wanted to lay down.    A: Pt was offered support and encouragement. Pt was given scheduled medications. Pt was encourage to attend groups. Q 15 minute checks were done for safety.    R:Pt attends groups. Pt is taking medication.Pt receptive to treatment and safety maintained on unit.
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
## 2012-12-04 NOTE — Progress Notes (Signed)
[facility] Group Notes:  (Counselor/Nursing/MHT/Case Management/Adjunct)  12/04/2012 2:44 PM   Type of Therapy:  Group Therapy  Participation Level:  Active  Participation Quality:  Appropriate  Affect:  Appropriate  Cognitive:  Delusional and Lacking  Insight:  Lacking  Engagement in Group:  Engaged  Engagement in Therapy:  Engaged  Modes of Intervention:  Activity, Discussion, Socialization and Support  Summary of Progress/Problems: Topic-Balance: Pt was pleasant and smiling throughout group. He talked about a man named Aurther Loft that changed his life each time he spoke to the group. Pt stated that he finds balance by being around friends in the [employer] Coffee [employer]. Pt participated in the ungame where he talked about the last time he felt successful. He discussed that he felt successful when taking a training course about achieving structure and balance in life from "[employer], [place] N 09/11/2012, 2:34 PM
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
## 2012-12-05 ENCOUNTER — Encounter (HOSPITAL_COMMUNITY): Payer: Self-pay

## 2012-12-05 MED ORDER — BENZTROPINE MESYLATE 2 MG PO TABS
2.0000 mg | ORAL_TABLET | Freq: Two times a day (BID) | ORAL | Status: DC
  Administered 2012-12-05 – 2012-12-09 (×8): 2 mg via ORAL
  Filled 2012-12-05 (×2): qty 1
  Filled 2012-12-05: qty 28
  Filled 2012-12-05 (×3): qty 1
  Filled 2012-12-05: qty 28
  Filled 2012-12-05 (×5): qty 1

## 2012-12-05 MED ORDER — MIRTAZAPINE 15 MG PO TABS
15.0000 mg | ORAL_TABLET | Freq: Every day | ORAL | Status: DC
  Administered 2012-12-05 – 2012-12-08 (×4): 15 mg via ORAL
  Filled 2012-12-05: qty 14
  Filled 2012-12-05 (×5): qty 1
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
## 2012-12-05 NOTE — Progress Notes (Signed)
Patient ID: Albert [name], male   DOB: 1976/06/22, 37 y.o.   MRN: [id number] [facility] MD Progress Note  12/05/2012 10:28 AM Albert [name]  MRN:  [id number]  Subjective: Patient reports decreased delusional thinking, agitation and irritability. He has been observed by the staffs to be less defiant or oppositional. He is now more compliant with his medication but states that he feels restless. He is will to continue with current medication regimen.   Diagnosis: Axis I: Schizophrenia, paranoid type  ADL's: Intact  Sleep: Fair Appetite: Fair   Suicidal Ideation:  Plan: Patient denies.  Intent: Patient denies.  Means: Patient denies.  Homicidal Ideation:  Plan: Patient denies.  Intent: Patient denies.  Means: Patient denies.   AEB (as evidenced by):Patient's report.  Psychiatric Specialty Exam: Review of Systems  Constitutional: Negative.   HENT: Negative.   Eyes: Negative.   Respiratory: Negative.   Cardiovascular: Negative.   Gastrointestinal: Negative.   Genitourinary: Negative.   Musculoskeletal: Negative.   Skin: Negative.   Neurological: Negative.   Endo/Heme/Allergies: Negative.   Psychiatric/Behavioral: Positive for hallucinations. The patient is nervous/anxious.     Blood pressure 116/76, pulse 101, temperature 98 F (36.7 C), temperature source Oral, resp. rate 16, height 5' 9.5" (1.765 m), weight 107.049 kg (236 lb), SpO2 96.00%.Body mass index is 34.35 kg/(m^2).  General Appearance: fairly groomed  Patent attorney::  fair  Speech:  increased  Volume:  Increased  Mood:  Irritable  Affect:  Blunt  Thought Process:  Tangential  Orientation:  Full (Time, Place, and Person)  Thought Content:  Delusions  Suicidal Thoughts:  No  Homicidal Thoughts:  No  Memory:  Immediate;   Good Recent;   Fair Remote;   Fair  Judgement:  Poor  Insight:  Poor  Psychomotor Activity:  Normal  Concentration:  Fair  Recall:  Fair  Akathisia:  Negative  Handed:  Left  AIMS (if  indicated):   As noted Below  Assets:  Desire for Improvement  Sleep:  Number of Hours: 6.25    Current Medications: Current Facility-Administered Medications  Medication Dose Route Frequency Provider Last Rate Last Dose  . acetaminophen (TYLENOL) tablet 650 mg  650 mg Oral Q6H PRN Shuvon Rankin, NP      . alum & mag hydroxide-simeth (MAALOX/MYLANTA) 200-200-20 MG/5ML suspension 30 mL  30 mL Oral Q4H PRN Shuvon Rankin, NP      . benztropine (COGENTIN) tablet 2 mg  2 mg Oral BID Calianna Kim      . divalproex (DEPAKOTE ER) 24 hr tablet 1,500 mg  1,500 mg Oral QHS Shuvon Rankin, NP   1,500 mg at 12/04/12 2146  . haloperidol (HALDOL) tablet 10 mg  10 mg Oral BID Tedford Berg   10 mg at 12/05/12 0749  . hydrOXYzine (ATARAX/VISTARIL) tablet 50 mg  50 mg Oral TID PRN Verne Spurr, PA-C   50 mg at 12/04/12 0340  . magnesium hydroxide (MILK OF MAGNESIA) suspension 30 mL  30 mL Oral Daily PRN Shuvon Rankin, NP      . mirtazapine (REMERON) tablet 15 mg  15 mg Oral QHS Aaryana Betke      . OLANZapine zydis (ZYPREXA) disintegrating tablet 10 mg  10 mg Oral Q8H PRN Mandela Bello      . pantoprazole (PROTONIX) EC tablet 40 mg  40 mg Oral Daily Verne Spurr, PA-C   40 mg at 12/05/12 0749  . traZODone (DESYREL) tablet 150 mg  150 mg Oral QHS PRN Rontrell Moquin  Lab Results: No results found for this or any previous visit (from the past 48 hour(s)).  Physical Findings: AIMS: Facial and Oral Movements Muscles of Facial Expression: None, normal Lips and Perioral Area: None, normal Jaw: None, normal Tongue: None, normal,Extremity Movements Upper (arms, wrists, hands, fingers): None, normal Lower (legs, knees, ankles, toes): None, normal, Trunk Movements Neck, shoulders, hips: None, normal, Overall Severity Severity of abnormal movements (highest score from questions above): None, normal Incapacitation due to abnormal movements: None, normal Patient's awareness of abnormal movements  (rate only patient's report): No Awareness, Dental Status Current problems with teeth and/or dentures?: No Does patient usually wear dentures?: No  CIWA:  CIWA-Ar Total: 1  COWS:     Treatment Plan Summary: Daily contact with patient to assess and evaluate symptoms and progress in treatment Medication management  Plan:  1. Continue inpatient treatment for crisis management and stabilization.  2. Medication management to reduce current symptoms to base line and improve the patient's overall level of functioning.  A) Will continue Depakote ER at 1500 mg daily at bedtime for mood. B) Will continue patient on Haldol 10mg  po BID. C) Will increase Cogentin to 2mg  bid for EPS prevention. D) Valproic acid level scheduled for Monday 12/08/12. 3. Treat health problems as indicated.  4. Develop treatment plan to decrease risk of relapse upon discharge and the need for readmission.  5. Psycho-social education regarding relapse prevention and self care.  6. Health care follow up as needed for medical problems.  7. Continue home medications where appropriate.   Medical Decision Making Problem Points:  Established problem, worsening (2) and Review of psycho-social stressors (1) Data Points:  Order Aims Assessment (2) Review or order clinical lab tests (1) Review of medication regiment & side effects (2) Review of new medications or change in dosage (2)  I certify that inpatient services furnished can reasonably be expected to improve the patient's condition.   Davonda Ausley,MD 12/05/2012, 10:28 AM

## 2012-12-05 NOTE — Progress Notes (Signed)
  D) Patient pleasant and cooperative upon my assessment. Patient brighter than yesterday. Patient reports slept "okay," and  appetite is "fine." Patient denies SI/HI, denies A/V hallucinations.   A) Patient offered support and encouragement, patient encouraged to discuss feelings/concerns with staff. Patient verbalized understanding. Patient monitored Q15 minutes for safety. Patient met with MD  to discuss today's goals and plan of care. Patient states "I am feeling better now."   R) Patient visible in milieu, attending some groups in day room and most meals in dining room. Patient appropriate with staff and peers.   Patient taking medications as ordered. Will continue to monitor.

## 2012-12-05 NOTE — Progress Notes (Signed)
[facility] LCSW Aftercare Discharge Planning Group Note  12/05/2012 9:45 AM  Participation Quality:  Appropriate  Affect:  Appropriate  Cognitive:  Appropriate  Insight:  Improving  Engagement in Group:  Engaged  Modes of Intervention:  Discussion, Problem-solving, Socialization and Support  Summary of Progress/Problems: Pt stated that he slept well last night thanks to new medication regimen. Pt able to list his medications and felt that they were working effectively for the time being. He reported that he sees Dr. Betti Cruz at [facility] but cannot remember his therapist's name at the moment. Pt was pleasant and smiling throughout group this morning.   Smart, Heather N 12/05/2012, 9:45 AM

## 2012-12-05 NOTE — Clinical Social Work Note (Signed)
[facility] LCSW Group Therapy  12/05/2012 1:28 PM   Type of Therapy:  Group Therapy  Participation Level:  Did not attend    Summary of Progress/Problems: Today's group focused on relapse prevention.  We defined the term, and then brainstormed on ways to prevent relapse.  [name] 12/05/2012 , 1:28 PM

## 2012-12-05 NOTE — Progress Notes (Signed)
Adult Psychoeducational Group Note  Date:  12/05/2012 Time:  11:49 AM  Group Topic/Focus:  Relapse Prevention Planning:   The focus of this group is to define relapse and discuss the need for planning to combat relapse.  Participation Level:  Did Not Attend  Participation Quality:    Affect:    Cognitive:    Insight:   Engagement in Group:    Modes of Intervention:    Additional Comments:  Pt was sleeping, refused to attend.  Isla Pence M 12/05/2012, 11:49 AM

## 2012-12-05 NOTE — Progress Notes (Signed)
Patient ID: Albert [name], male   DOB: 11-Aug-1976, 37 y.o.   MRN: [id number]  D: Pt denies SI/HI/AVH. Pt is pleasant and cooperative. Pt a little more talkative than yesterday, but does not go into much detail, continues to be somewhat guarded. Pt spent a little time in dayroom, but overall pt remains isolative.   A: Pt was offered support and encouragement. Pt was given scheduled medications. Pt was encourage to attend groups. Q 15 minute checks were done for safety.    R:. Pt is taking medication.  safety maintained on unit.

## 2012-12-06 NOTE — Progress Notes (Signed)
Psychoeducational Group Note  Date:  12/06/2012 Time:0930am  Group Topic/Focus:  Identifying Needs:   The focus of this group is to help patients identify their personal needs that have been historically problematic and identify healthy behaviors to address their needs.  Participation Level:  Active  Participation Quality:  Appropriate  Affect:  Appropriate  Cognitive:  Appropriate  Insight:  Supportive  Engagement in Group:  Supportive  Additional Comments:  Inventory group   Valente David 12/06/2012,10:23 AM

## 2012-12-06 NOTE — Progress Notes (Signed)
Patient ID: Albert [name], male   DOB: Mar 02, 1976, 37 y.o.   MRN: [id number] Patient ID: Albert [name], male   DOB: Sep 11, 1976, 37 y.o.   MRN: [id number] [facility] MD Progress Note  12/06/2012 2:24 PM Leamon Palau  MRN:  [id number]  Subjective: "I'm feeling pretty good. I don't feel the heavy senses going on in my body any more. I don't feel suicidal or homicidal. I'm not hearing any voices and or seeing things. I can feel my heart beat normal, just like it should".  Diagnosis: Axis I: Schizophrenia, paranoid type  ADL's: Intact  Sleep: Fair Appetite: Fair   Suicidal Ideation: "No" Plan: No Intent: No Means: No Homicidal Ideation: "No" Plan: No Intent: No Means: No   AEB (as evidenced by):Patient's report.  Psychiatric Specialty Exam: Review of Systems  Constitutional: Negative.   HENT: Negative.   Eyes: Negative.   Respiratory: Negative.   Cardiovascular: Negative.   Gastrointestinal: Negative.   Genitourinary: Negative.   Musculoskeletal: Negative.   Skin: Negative.   Neurological: Negative.   Endo/Heme/Allergies: Negative.   Psychiatric/Behavioral: Positive for hallucinations. The patient is nervous/anxious.     Blood pressure 127/85, pulse 105, temperature 98 F (36.7 C), temperature source Oral, resp. rate 20, height 5' 9.5" (1.765 m), weight 107.049 kg (236 lb), SpO2 96.00%.Body mass index is 34.36 kg/(m^2).  General Appearance: fairly groomed, Casual  Eye Contact::  Good  Speech:  Within Normal  Volume:  Within Normal  Mood:  "I feeling pretty good"  Affect:  Appropriate  Thought Process:  Coherent  Orientation:  Full (Time, Place, and Person)  Thought Content:  Denies hallucinations, delusional thoughts and or paranoia.  Suicidal Thoughts:  No  Homicidal Thoughts:  No  Memory:  Immediate;   Good Recent;   Fair Remote;   Fair  Judgement:  Poor  Insight:  Poor  Psychomotor Activity:  Normal  Concentration:  Fair  Recall:  Fair  Akathisia:   Negative  Handed:  Left  AIMS (if indicated):   As noted Below  Assets:  Desire for Improvement  Sleep:  Number of Hours: 5.75   Current Medications: Current Facility-Administered Medications  Medication Dose Route Frequency Provider Last Rate Last Dose  . acetaminophen (TYLENOL) tablet 650 mg  650 mg Oral Q6H PRN Shuvon Rankin, NP      . alum & mag hydroxide-simeth (MAALOX/MYLANTA) 200-200-20 MG/5ML suspension 30 mL  30 mL Oral Q4H PRN Shuvon Rankin, NP      . benztropine (COGENTIN) tablet 2 mg  2 mg Oral BID Mojeed Akintayo   2 mg at 12/06/12 0755  . divalproex (DEPAKOTE ER) 24 hr tablet 1,500 mg  1,500 mg Oral QHS Shuvon Rankin, NP   1,500 mg at 12/05/12 2121  . haloperidol (HALDOL) tablet 10 mg  10 mg Oral BID Mojeed Akintayo   10 mg at 12/06/12 0755  . hydrOXYzine (ATARAX/VISTARIL) tablet 50 mg  50 mg Oral TID PRN Verne Spurr, PA-C   50 mg at 12/06/12 1025  . magnesium hydroxide (MILK OF MAGNESIA) suspension 30 mL  30 mL Oral Daily PRN Shuvon Rankin, NP      . mirtazapine (REMERON) tablet 15 mg  15 mg Oral QHS Mojeed Akintayo   15 mg at 12/05/12 2123  . OLANZapine zydis (ZYPREXA) disintegrating tablet 10 mg  10 mg Oral Q8H PRN Mojeed Akintayo      . pantoprazole (PROTONIX) EC tablet 40 mg  40 mg Oral Daily Verne Spurr, PA-C   40 mg at 12/06/12  6  . traZODone (DESYREL) tablet 150 mg  150 mg Oral QHS PRN Mojeed Akintayo        Lab Results: No results found for this or any previous visit (from the past 48 hour(s)).  Physical Findings: AIMS: Facial and Oral Movements Muscles of Facial Expression: None, normal Lips and Perioral Area: None, normal Jaw: None, normal Tongue: None, normal,Extremity Movements Upper (arms, wrists, hands, fingers): None, normal Lower (legs, knees, ankles, toes): None, normal, Trunk Movements Neck, shoulders, hips: None, normal, Overall Severity Severity of abnormal movements (highest score from questions above): None, normal Incapacitation due to  abnormal movements: None, normal Patient's awareness of abnormal movements (rate only patient's report): No Awareness, Dental Status Current problems with teeth and/or dentures?: No Does patient usually wear dentures?: No  CIWA:  CIWA-Ar Total: 1 COWS:     Treatment Plan Summary: Daily contact with patient to assess and evaluate symptoms and progress in treatment Medication management  Plan: Supportive approach/coping skills/relapse prevention. No new changes made on the current treatment plan. Encouraged out of room, participation in group sessions and application of coping skills when distressed. Will continue to monitor response to/adverse effects of medications in use to assure effectiveness. Continue to monitor mood, behavior and interaction with staff and other patients. Continue current plan of care.  Medical Decision Making Problem Points:  Established problem, stable (1) and Review of psycho-social stressors (1) Data Points:  Order Aims Assessment (2) Review or order clinical lab tests (1) Review of medication regiment & side effects (2) Review of new medications or change in dosage (2)  I certify that inpatient services furnished can reasonably be expected to improve the patient's condition.   Armandina Stammer I,MD 12/06/2012, 2:24 PM

## 2012-12-06 NOTE — Clinical Social Work Note (Signed)
[facility] Group Notes:  (Clinical Social Work)  12/06/2012  11:15-11:45AM  Summary of Progress/Problems:   Summary of Progress/Problems:   The main focus of today's process group was for the patient to identify ways in which they have in the past sabotaged their own recovery and reasons they may have done this/what they received from doing it.  Motivational interviewing techniques were utilized to explore motivations and plans to avoid self-sabotage when discharged from the hospital for this admission.  The patient expressed optimism about making necessary changes in his life to remain well, citing his motivation at 10 out of 10.  He will return to see Dr. Betti Cruz for medication management and Gretchen Short for therapy.  He would be interested in possibly going to [facility].  Type of Therapy:  Group Therapy - Process  Participation Level:  Active  Participation Quality:  Attentive and Sharing  Affect:  Blunted  Cognitive:  Appropriate and Oriented  Insight:  Engaged  Engagement in Therapy:  Engaged  Modes of Intervention:  Clarification, Education, Limit-setting, Problem-solving, Socialization, Support and Processing, Exploration, Discussion   Ambrose Mantle, LCSW 12/06/2012, 1:07 PM

## 2012-12-06 NOTE — Progress Notes (Signed)
D   Pt was pleasant and appropriate this morning at the medication window  He came to get medications without being told to and was conversational and polite  He has been out on the [place] watching tv in the dayroom   His thinking is logical and coherent   He denies suicidal and homicidal ideation  A   Verbal support given  Medications administered and effectiveness monitored   Q 15 min checks R   Pt safe at present

## 2012-12-06 NOTE — Progress Notes (Signed)
Psychoeducational Group Note  Date:  12/06/2012 Time:  0945 am  Group Topic/Focus:  Identifying Needs:   The focus of this group is to help patients identify their personal needs that have been historically problematic and identify healthy behaviors to address their needs.  Participation Level:  Active  Participation Quality:  Appropriate, Attentive, Sharing and Supportive  Affect:  Appropriate  Cognitive:  Alert and Appropriate  Insight:  Engaged and Improving  Engagement in Group:  Engaged and Supportive  Additional Comments:    Andrena Mews 12/06/2012,11:59 AM

## 2012-12-07 NOTE — Progress Notes (Signed)
Patient ID: Albert [name], male   DOB: 1976/04/14, 37 y.o.   MRN: [id number] Patient ID: Albert [name], male   DOB: [birth year], 37 y.o.   MRN: [id number] Patient ID: Albert [name], male   DOB: 03-21-1976, 37 y.o.   MRN: [id number] [facility] MD Progress Note  12/07/2012 12:53 PM Albert [name]  MRN:  [id number]  Subjective: "My anger has calmed down a lot from the time that I came in here. I feel like I'm in control now. I have no SIHI or AVH. I'm doing pretty good. Slept well. Appetite is good".  Diagnosis: Axis I: Schizophrenia, paranoid type  ADL's: Intact  Sleep: Fair Appetite: Fair   Suicidal Ideation: "No" Plan: No Intent: No Means: No Homicidal Ideation: "No" Plan: No Intent: No Means: No   AEB (as evidenced by):Patient's report.  Psychiatric Specialty Exam: Review of Systems  Constitutional: Negative.   HENT: Negative.   Eyes: Negative.   Respiratory: Negative.   Cardiovascular: Negative.   Gastrointestinal: Negative.   Genitourinary: Negative.   Musculoskeletal: Negative.   Skin: Negative.   Neurological: Negative.   Endo/Heme/Allergies: Negative.   Psychiatric/Behavioral: Positive for hallucinations. The patient is nervous/anxious.     Blood pressure 125/84, pulse 101, temperature 97.3 F (36.3 C), temperature source Oral, resp. rate 20, height 5' 9.5" (1.765 m), weight 107.049 kg (236 lb), SpO2 96.00%.Body mass index is 34.36 kg/(m^2).  General Appearance: fairly groomed, Casual  Eye Contact::  Good  Speech:  Within Normal  Volume:  Within Normal  Mood:  "I feeling pretty good"  Affect:  Appropriate  Thought Process:  Coherent  Orientation:  Full (Time, Place, and Person)  Thought Content:  Denies hallucinations, delusional thoughts and or paranoia.  Suicidal Thoughts:  No  Homicidal Thoughts:  No  Memory:  Immediate;   Good Recent;   Fair Remote;   Fair  Judgement:  Poor  Insight:  Poor  Psychomotor Activity:  Normal  Concentration:   Fair  Recall:  Fair  Akathisia:  Negative  Handed:  Left  AIMS (if indicated):   As noted Below  Assets:  Desire for Improvement  Sleep:  Number of Hours: 2.75   Current Medications: Current Facility-Administered Medications  Medication Dose Route Frequency Provider Last Rate Last Dose  . acetaminophen (TYLENOL) tablet 650 mg  650 mg Oral Q6H PRN Shuvon Rankin, NP      . alum & mag hydroxide-simeth (MAALOX/MYLANTA) 200-200-20 MG/5ML suspension 30 mL  30 mL Oral Q4H PRN Shuvon Rankin, NP      . benztropine (COGENTIN) tablet 2 mg  2 mg Oral BID Mojeed Akintayo   2 mg at 12/07/12 0743  . divalproex (DEPAKOTE ER) 24 hr tablet 1,500 mg  1,500 mg Oral QHS Shuvon Rankin, NP   1,500 mg at 12/06/12 2131  . haloperidol (HALDOL) tablet 10 mg  10 mg Oral BID Mojeed Akintayo   10 mg at 12/07/12 0743  . hydrOXYzine (ATARAX/VISTARIL) tablet 50 mg  50 mg Oral TID PRN Verne Spurr, PA-C   50 mg at 12/06/12 1025  . magnesium hydroxide (MILK OF MAGNESIA) suspension 30 mL  30 mL Oral Daily PRN Shuvon Rankin, NP      . mirtazapine (REMERON) tablet 15 mg  15 mg Oral QHS Mojeed Akintayo   15 mg at 12/06/12 2131  . OLANZapine zydis (ZYPREXA) disintegrating tablet 10 mg  10 mg Oral Q8H PRN Mojeed Akintayo   10 mg at 12/06/12 2350  . pantoprazole (PROTONIX) EC tablet 40 mg  40  mg Oral Daily Verne Spurr, PA-C   40 mg at 12/07/12 0743  . traZODone (DESYREL) tablet 150 mg  150 mg Oral QHS PRN Mojeed Akintayo        Lab Results: No results found for this or any previous visit (from the past 48 hour(s)).  Physical Findings: AIMS: Facial and Oral Movements Muscles of Facial Expression: None, normal Lips and Perioral Area: None, normal Jaw: None, normal Tongue: None, normal,Extremity Movements Upper (arms, wrists, hands, fingers): None, normal Lower (legs, knees, ankles, toes): None, normal, Trunk Movements Neck, shoulders, hips: None, normal, Overall Severity Severity of abnormal movements (highest score from  questions above): None, normal Incapacitation due to abnormal movements: None, normal Patient's awareness of abnormal movements (rate only patient's report): No Awareness, Dental Status Current problems with teeth and/or dentures?: No Does patient usually wear dentures?: No  CIWA:  CIWA-Ar Total: 1 COWS:     Treatment Plan Summary: Daily contact with patient to assess and evaluate symptoms and progress in treatment Medication management  Plan: Supportive approach/coping skills/relapse prevention. No new changes made on the current treatment plan. Encouraged out of room, participation in group sessions and application of coping skills when distressed. Will continue to monitor response to/adverse effects of medications in use to assure effectiveness. Continue to monitor mood, behavior and interaction with staff and other patients. Continue current plan of care.  Medical Decision Making Problem Points:  Established problem, stable (1) and Review of psycho-social stressors (1) Data Points:  Order Aims Assessment (2) Review or order clinical lab tests (1) Review of medication regiment & side effects (2) Review of new medications or change in dosage (2)  I certify that inpatient services furnished can reasonably be expected to improve the patient's condition.   Armandina Stammer I, FNP, PMH-NP-BC 12/07/2012, 12:53 PM

## 2012-12-07 NOTE — Progress Notes (Signed)
D   Pt has been pleasant and appropriate today   He requested to speak with this Clinical research associate and told of a situation where he felt like staff were not supportive of him   He was very calm and logical when discussing same  He attended groups and participated   He played the piano and sang  A   Reassured pt of our concern for his well being while here at [facility] and requested asst director to speak with pt in the morning   Medications administered and effectiveness monitored   Q 15 min checks R  Pt safe at present

## 2012-12-07 NOTE — Progress Notes (Signed)
[facility] Group Notes:  (Nursing/MHT/Case Management/Adjunct)  Date:  12/07/2012  Time:  2:26 PM  Type of Therapy:  MHT group  Participation Level:  Active  Participation Quality:  Appropriate and Sharing  Affect:  Appropriate  Cognitive:  Alert and Appropriate  Insight:  Good and Improving  Engagement in Group:  Engaged  Modes of Intervention:  Discussion  Summary of Progress/Problems:  Albert [name] 12/07/2012, 2:26 PM

## 2012-12-07 NOTE — Progress Notes (Signed)
Psychoeducational Group Note  Date:  12/07/2012 Time:  0945 am  Group Topic/Focus:  Making Healthy Choices:   The focus of this group is to help patients identify negative/unhealthy choices they were using prior to admission and identify positive/healthier coping strategies to replace them upon discharge.  Participation Level:  Active  Participation Quality:  Appropriate, Attentive, Sharing and Supportive  Affect:  Appropriate and Blunted  Cognitive:  Alert  Insight:  Engaged  Engagement in Group:  Engaged  Additional Comments:    Andrena Mews 12/07/2012, 10:29 AM

## 2012-12-07 NOTE — Progress Notes (Signed)
Adult Psychoeducational Group Note  Date:  12/07/2012 Time:  2:54 AM  Group Topic/Focus:  Wrap-Up Group:   The focus of this group is to help patients review their daily goal of treatment and discuss progress on daily workbooks.  Participation Level:  Active  Participation Quality:  Appropriate  Affect:  Appropriate  Cognitive:  Appropriate  Insight: Appropriate  Engagement in Group:  Engaged  Modes of Intervention:  Support  Additional Comments:  Patient attended and participated in group tonight. He reports that he went to his meals today, he spoke with a family member who is like a mother to him because his real mother is dead. He attended groups, slept a little and socialized. Patient advised that he cope by coloring which relaxes him. He will like to learn how to listen more. He believes that he talk a lot about himself and he would listen more. Patient reports that today a student came to speak with him. Although he gave her the information she asked, he was uncomfortable talking with her.  Lita Mains [facility] 12/07/2012, 2:54 AM

## 2012-12-07 NOTE — Progress Notes (Signed)
Patient ID: Yandell Mcjunkins, male   DOB: 09-Aug-1976, 37 y.o.   MRN: [id number] Psychoeducational Group Note  Date:  12/07/2012 Time:  0930am  Group Topic/Focus:  Making Healthy Choices:   The focus of this group is to help patients identify negative/unhealthy choices they were using prior to admission and identify positive/healthier coping strategies to replace them upon discharge.  Participation Level:  Active  Participation Quality:  Appropriate  Affect:  Appropriate  Cognitive:  Appropriate  Insight:  Supportive  Engagement in Group:  Supportive  Additional Comments:  Inventory group   Valente David 12/07/2012,10:06 AM

## 2012-12-07 NOTE — Clinical Social Work Note (Signed)
[facility] Group Notes:  (Clinical Social Work)  12/07/2012   11:15-11:45AM  Summary of Progress/Problems:  The main focus of today's process group was to listen to a variety of genres of music and to identify that different types of music provoke different responses.  The patient then was able to identify personally what was soothing for them, as well as energizing.  Examples were given of how to use this knowledge in sleep habits, with depression, and with other symptoms.  The patient expressed ability to understand what types of music evoke different emotions in him, as evidenced by filling out the worksheet and participating actively  Type of Therapy:  Music Therapy with processing done  Participation Level:  Active  Participation Quality:  Attentive and Sharing  Affect:  Appropriate  Cognitive:  Appropriate and Oriented  Insight:  Engaged  Engagement in Therapy:  Engaged  Modes of Intervention:   Socialization, Teacher, English as a foreign language, Exploration, Education, Rapport Building   [employer], LCSW 12/07/2012, 12:49 PM

## 2012-12-07 NOTE — Progress Notes (Signed)
D: Pt calm, cooperative, and interactive.  Attended wrapup group and provided feedback and support to peers.  Eye contact is sustained, facial expression animated but occasionally worried.  Affect is flat with anxious mood.  Speech is logical and coherent; no overt evidence of disorganized thought process or content.  Pt denies SI/HI, AVH, presence of "sensors", and acute pain.  Contracting for safety on unit.  Pt expressed in wrapup that he was pleased with himself for expressing to nursing student that he no longer wanted to interact with her rather than "freaking out and making a scene".  Pt later approached this RN for assistance because dayroom conversation had become loud and hyper-religious and was making him uncomfortable.  A: Provided verbal support, encouragement, and reinforcement to Pt for utilizing coping skills and setting a boundary when he felt his "soul was invaded".  PRNs: Zydis 10mg  PO 2350.  All medications administered according to med orders and POC.  Q15 minute safety checks maintained as per unit protocol.  R: Pt utilizing new skills; positive regarding future.  Safety maintained. Dion Saucier RN

## 2012-12-08 LAB — VALPROIC ACID LEVEL: Valproic Acid Lvl: 94.8 ug/mL (ref 50.0–100.0)

## 2012-12-08 NOTE — Progress Notes (Signed)
Recreation Therapy Notes  12/08/2012         Time: 9:30am      Group Topic/Focus: Exercise Education/Wellness  Participation Level: Active  Participation Quality: Appropriate  Affect: Flat  Cognitive: Oriented   Additional Comments: Patient did stretches with the group.Patient played "Exercise Spin" with the group. Patient was able to name monkey bars, swings and rings as exercises you can participate in while at the park.  Patient was able to name stretching, yoga and silent mediation as exercises you can participate in in your hospital room. Patient named hot steam shower stretches as an exercise he would like to try in the future. Patient participated in group conversation about exercises you can do without any equipment. Patient stated "playing volleyball is my favorite."   Marykay Lex Galloway, LRT/CTRS  Jearl Klinefelter 12/08/2012 12:13 PM

## 2012-12-08 NOTE — Progress Notes (Signed)
D- Patient is out in milieu interacting with peers and attending groups with good participation.  Denies SI.  Is compliant with scheduled medications. Denies SI and A/V/H.  Anticipating successful d/c in the am. A- Support and encouragement given.  Continue current POC and evaluation of treatment goals.  Continue 15' checks for sfaety. R- Reamains safe.

## 2012-12-08 NOTE — Progress Notes (Signed)
[facility] LCSW Aftercare Discharge Planning Group Note  12/08/2012 9:35 AM  Participation Quality:  Attentive  Affect:  Appropriate  Cognitive:  Oriented  Insight:  Improving  Engagement in Group:  Engaged  Modes of Intervention:  Socialization  Summary of Progress/Problems:  Albert [name] is in a good mood today.  He is engaged with staff.  He states the symptoms are gone, and that he feels the Haldol is helpful and plans to continue to take this post d/c.  Will follow up with Dr Betti Cruz.  Wants to se Dr today so he gets an idea of when he will be discharged.  [name] 12/08/2012, 9:35 AM

## 2012-12-08 NOTE — Clinical Social Work Note (Signed)
  Type of Therapy: Process Group Therapy  Participation Level:  Active  Participation Quality:  Attentive  Affect:  Flat  Cognitive:  Oriented  Insight:  Improving  Engagement in Group:  Engaged  Engagement in Therapy:  Limited  Modes of Intervention:  Activity, Clarification, Education, Problem-solving and Support  Summary of Progress/Problems: Today's group addressed the issue of overcoming obstacles.  Patients were asked to identify their biggest obstacle post d/c that stands in the way of their on-going success, and then problem solve as to how to manage this. Albert [name] cites his biggest obstacle as staying connected with his community supports, and spent some time problem solving about what to say to people about where he has been for the past week and a half.  He was open to other group members feedback.  He decided after some conversation that it really wasn't that big of a deal, and felt good about his plan.  He tends to lose the thread in the course of his conversation, but is now able to stop himself and get a reality check rather than just going on in a circumstantial way.       Albert [name] 12/08/2012   2:42 PM

## 2012-12-08 NOTE — Progress Notes (Signed)
Patient ID: Albert [name], male   DOB: 1976-05-24, 37 y.o.   MRN: [id number] [facility] MD Progress Note  12/08/2012 11:57 AM Jarret Torre  MRN:  [id number]  Subjective: "I'm doing well." Albert [name] is stating he is ready to go home. He denies new symptoms and feels that his medication is doing well. Diagnosis: Axis I: Schizophrenia, paranoid type  ADL's: Intact  Sleep: Fair Appetite: Fair   Suicidal Ideation: "No" Plan: No Intent: No Means: No Homicidal Ideation: "No" Plan: No Intent: No Means: No   AEB (as evidenced by):Patient's report.  Psychiatric Specialty Exam: Review of Systems  Constitutional: Negative.  Negative for fever, chills, weight loss, malaise/fatigue and diaphoresis.  HENT: Negative.  Negative for congestion and sore throat.   Eyes: Negative.  Negative for blurred vision, double vision and photophobia.  Respiratory: Negative.  Negative for cough, shortness of breath and wheezing.   Cardiovascular: Negative.  Negative for chest pain, palpitations and PND.  Gastrointestinal: Negative.  Negative for heartburn, nausea, vomiting, abdominal pain, diarrhea and constipation.  Genitourinary: Negative.   Musculoskeletal: Negative.  Negative for myalgias, joint pain and falls.  Skin: Negative.   Neurological: Negative.  Negative for dizziness, tingling, tremors, sensory change, speech change, focal weakness, seizures, loss of consciousness, weakness and headaches.  Endo/Heme/Allergies: Negative.  Negative for polydipsia. Does not bruise/bleed easily.  Psychiatric/Behavioral: Negative for depression, suicidal ideas, hallucinations, memory loss and substance abuse. The patient is nervous/anxious. The patient does not have insomnia.     Blood pressure 112/88, pulse 105, temperature 96.5 F (35.8 C), temperature source Oral, resp. rate 18, height 5' 9.5" (1.765 m), weight 107.049 kg (236 lb), SpO2 96.00%.Body mass index is 34.36 kg/(m^2).  General Appearance: fairly  groomed, Casual  Eye Contact::  Good  Speech:  Within Normal  Volume:  Within Normal  Mood:  "I feeling pretty good"  Affect:  Appropriate  Thought Process:  Coherent  Orientation:  Full (Time, Place, and Person)  Thought Content:  Denies hallucinations, delusional thoughts and or paranoia.  Suicidal Thoughts:  No  Homicidal Thoughts:  No  Memory:  Immediate;   Good Recent;   Fair Remote;   Fair  Judgement:  Poor  Insight:  Poor  Psychomotor Activity:  Normal  Concentration:  Fair  Recall:  Fair  Akathisia:  Negative  Handed:  Left  AIMS (if indicated):   As noted Below  Assets:  Desire for Improvement  Sleep:  Number of Hours: 6.75   Current Medications: Current Facility-Administered Medications  Medication Dose Route Frequency Provider Last Rate Last Dose  . acetaminophen (TYLENOL) tablet 650 mg  650 mg Oral Q6H PRN Shuvon Rankin, NP      . alum & mag hydroxide-simeth (MAALOX/MYLANTA) 200-200-20 MG/5ML suspension 30 mL  30 mL Oral Q4H PRN Shuvon Rankin, NP      . benztropine (COGENTIN) tablet 2 mg  2 mg Oral BID Mojeed Akintayo   2 mg at 12/08/12 0742  . divalproex (DEPAKOTE ER) 24 hr tablet 1,500 mg  1,500 mg Oral QHS Shuvon Rankin, NP   1,500 mg at 12/07/12 2101  . haloperidol (HALDOL) tablet 10 mg  10 mg Oral BID Mojeed Akintayo   10 mg at 12/08/12 0742  . hydrOXYzine (ATARAX/VISTARIL) tablet 50 mg  50 mg Oral TID PRN Verne Spurr, PA-C   50 mg at 12/06/12 1025  . magnesium hydroxide (MILK OF MAGNESIA) suspension 30 mL  30 mL Oral Daily PRN Shuvon Rankin, NP      . mirtazapine (  REMERON) tablet 15 mg  15 mg Oral QHS Mojeed Akintayo   15 mg at 12/07/12 2101  . OLANZapine zydis (ZYPREXA) disintegrating tablet 10 mg  10 mg Oral Q8H PRN Mojeed Akintayo   10 mg at 12/06/12 2350  . pantoprazole (PROTONIX) EC tablet 40 mg  40 mg Oral Daily Verne Spurr, PA-C   40 mg at 12/08/12 0743  . traZODone (DESYREL) tablet 150 mg  150 mg Oral QHS PRN Mojeed Akintayo        Lab Results:   Results for orders placed during the hospital encounter of 11/28/12 (from the past 48 hour(s))  VALPROIC ACID LEVEL     Status: None   Collection Time    12/08/12  6:19 AM      Result Value Range   Valproic Acid Lvl 94.8  50.0 - 100.0 ug/mL    Physical Findings: AIMS: Facial and Oral Movements Muscles of Facial Expression: None, normal Lips and Perioral Area: None, normal Jaw: None, normal Tongue: None, normal,Extremity Movements Upper (arms, wrists, hands, fingers): None, normal Lower (legs, knees, ankles, toes): None, normal, Trunk Movements Neck, shoulders, hips: None, normal, Overall Severity Severity of abnormal movements (highest score from questions above): None, normal Incapacitation due to abnormal movements: None, normal Patient's awareness of abnormal movements (rate only patient's report): No Awareness, Dental Status Current problems with teeth and/or dentures?: No Does patient usually wear dentures?: No  CIWA:  CIWA-Ar Total: 1 COWS:     Treatment Plan Summary: Daily contact with patient to assess and evaluate symptoms and progress in treatment Medication management  Plan: Supportive approach/coping skills/relapse prevention. No new changes made on the current treatment plan. Encouraged out of room, participation in group sessions and application of coping skills when distressed. Will continue to monitor response to/adverse effects of medications in use to assure effectiveness. Continue to monitor mood, behavior and interaction with staff and other patients. Continue current plan of care. 1. Will plan to discharge home tomorrow. Medical Decision Making Problem Points:  Established problem, stable (1) and Review of psycho-social stressors (1) Data Points:  Order Aims Assessment (2) Review or order clinical lab tests (1) Review of medication regiment & side effects (2) Review of new medications or change in dosage (2)  I certify that inpatient services  furnished can reasonably be expected to improve the patient's condition.  Rona Ravens. Evanee Lubrano PAC 12/08/2012, 11:57 AM

## 2012-12-08 NOTE — Progress Notes (Signed)
Adult Psychoeducational Group Note  Date:  12/08/2012 Time:  2005  Group Topic/Focus:  Wrap-Up Group:   The focus of this group is to help patients review their daily goal of treatment and discuss progress on daily workbooks.  Participation Level:  Active  Participation Quality:  Appropriate and Supportive  Affect:  Appropriate  Cognitive:  Alert and Appropriate  Insight: Good  Engagement in Group:  Engaged and Supportive  Modes of Intervention:  Discussion and Support  Additional Comments:  Pt. attended and participated in group discussion.   Gwenevere Ghazi Patience 12/08/2012, 10:53 PM

## 2012-12-08 NOTE — Progress Notes (Signed)
D: Pt appeared to be less positive today than yesterday but stated his day "went well".  Visible on unit, attended wrapup group.  Became very preoccupied with and obsessive about having been asked to change seats by a staff member, with almost complete rewording of actual conversation and refusal to accept any apology or empathic response from staff.  Peers became very frustrated but provided constructive feedback, attempted to distract Pt from topic, but he had great difficulty refocusing until the meeting ended and he was able to speak with the staff member alone (at which time he was reportedly gracious and polite).  Eye contact fair with anxious facial expression.  Affect and mood labile but generally anxious.  Pt argumentative during group, but otherwise polite with speech logical and coherent.  Some defensiveness and irritability toward peers was evident throughout the evening, but Pt was cooperative with unit expectations and medications.  Denies SI/HI, AVH, tactile hallucinations (sensors), and acute pain.  Contracting for safety on unit.  A: Attempted to provide verbal and emotional support but Pt was unusually distant and difficult to engage.  Reinforced that he has now had an extended time period without hallucinations and that his efforts with new coping skills are paying off.  No PRNs given.  All medications administered according to med orders and POC.  Q15 minute safety checks maintained as per unit protocol.  R: Pt tense but in control.  Safety maintained. Dion Saucier RN

## 2012-12-08 NOTE — Care Management Utilization Note (Signed)
PER STATE REGULATIONS 482.30  THIS CHART WAS REVIEWED FOR MEDICAL NECESSITY WITH RESPECT TO THE PATIENT'S ADMISSION/ DURATION OF STAY.  NEXT REVIEW DATE: 12/11/2012   

## 2012-12-09 DIAGNOSIS — F1010 Alcohol abuse, uncomplicated: Secondary | ICD-10-CM

## 2012-12-09 MED ORDER — HALOPERIDOL 10 MG PO TABS: 10.0000 mg | ORAL_TABLET | Freq: Two times a day (BID) | ORAL | Status: DC

## 2012-12-09 MED ORDER — MIRTAZAPINE 15 MG PO TABS: 15.0000 mg | ORAL_TABLET | Freq: Every day | ORAL | Status: DC

## 2012-12-09 MED ORDER — BENZTROPINE MESYLATE 2 MG PO TABS: 2.0000 mg | ORAL_TABLET | Freq: Two times a day (BID) | ORAL | Status: DC

## 2012-12-09 MED ORDER — DIVALPROEX SODIUM ER 500 MG PO TB24: 1500.0000 mg | ORAL_TABLET | Freq: Every day | ORAL | Status: DC

## 2012-12-09 NOTE — Discharge Summary (Signed)
Physician Discharge Summary Note  Patient:  Albert [name] is an 37 y.o., male MRN:  [id number] DOB:  1976/07/13 Patient phone:  [phone] (home)  Patient address:   [address] [place] [address],   Date of Admission:  11/28/2012 Date of Discharge: 12/08/2012  Reason for Admission: Paranoid Schizophrenia  Discharge Diagnoses: Active Problems:   Schizophrenia, paranoid type   Medical non-compliance Discharge Diagnoses:  AXIS I: Schizophrenia, Paranoid type  Alcohol abuse  AXIS II: Deferred  AXIS III:  Past Medical History   Diagnosis  Date   .  Seizures    AXIS IV: other psychosocial or environmental problems and problems related to social environment  AXIS V: 61-70 mild symptoms Review of Systems  Constitutional: Negative.  Negative for fever, chills, weight loss, malaise/fatigue and diaphoresis.  HENT: Negative for congestion and sore throat.   Eyes: Negative for blurred vision, double vision and photophobia.  Respiratory: Negative for cough, shortness of breath and wheezing.   Cardiovascular: Negative for chest pain, palpitations and PND.  Gastrointestinal: Negative for heartburn, nausea, vomiting, abdominal pain, diarrhea and constipation.  Musculoskeletal: Negative for myalgias, joint pain and falls.  Neurological: Negative for dizziness, tingling, tremors, sensory change, speech change, focal weakness, seizures, loss of consciousness, weakness and headaches.  Endo/Heme/Allergies: Negative for polydipsia. Does not bruise/bleed easily.  Psychiatric/Behavioral: Negative for depression, suicidal ideas, hallucinations, memory loss and substance abuse. The patient is not nervous/anxious and does not have insomnia.    Level of Care:  OP  Hospital Course:  Lyonel was admitted to [facility] after presenting to the ED requesting assistance with his mental health. He reports having a long history of paranoid schizophrenia, and has had 2 admissions in the last 2  months, the most recent being at [facility].  He states he sees Dr. Betti Cruz as an outpatient psychiatrist and had negotiated to lower his Seroquel dose from 600mg  BID to 300mg  BID.  He has become disorganized and responding to internal stimulation with pressured speech, and mood lability.  He adamantly refused to take a higher dose of Seroquel as he was on 800mg  on his last in patient stay at [facility].      After 2 days of his refusing medication a forced meds order was written and he was changed to Haldol 10mg  titrated to BID dosing.  He was also placed on Remeron 15mg  po at hs for sleep, and Cogentin for any EPS.  He had no side effects with the Haldol, and his response to medication was rapid.  He reported a decrease in agitation and mood lability, apologized for his rude behaviors and explicit language. He also noted improved sleep and normal appetite. His mood improved significantly.     On the day of discharge he was in full contact with reality and in much improved condition.  Consults:  None  Significant Diagnostic Studies:  labs: Please see all labs associated with this visit via EMR  Discharge Vitals:   Blood pressure 122/85, pulse 93, temperature 97.6 F (36.4 C), temperature source Oral, resp. rate 16, height 5' 9.5" (1.765 m), weight 107.049 kg (236 lb), SpO2 96.00%. Body mass index is 34.36 kg/(m^2). Lab Results:   Results for orders placed during the hospital encounter of 11/28/12 (from the past 72 hour(s))  VALPROIC ACID LEVEL     Status: None   Collection Time    12/08/12  6:19 AM      Result Value Range   Valproic Acid Lvl 94.8  50.0 -  100.0 ug/mL    Physical Findings: AIMS: Facial and Oral Movements Muscles of Facial Expression: None, normal Lips and Perioral Area: None, normal Jaw: None, normal Tongue: None, normal,Extremity Movements Upper (arms, wrists, hands, fingers): None, normal Lower (legs, knees, ankles, toes): None, normal, Trunk Movements Neck, shoulders, hips:  None, normal, Overall Severity Severity of abnormal movements (highest score from questions above): None, normal Incapacitation due to abnormal movements: None, normal Patient's awareness of abnormal movements (rate only patient's report): No Awareness, Dental Status Current problems with teeth and/or dentures?: No Does patient usually wear dentures?: No  CIWA:  CIWA-Ar Total: 1 COWS:     Psychiatric Specialty Exam: See Psychiatric Specialty Exam and Suicide Risk Assessment completed by Attending Physician prior to discharge.  Discharge destination:  Home  Is patient on multiple antipsychotic therapies at discharge:  No   Has Patient had three or more failed trials of antipsychotic monotherapy by history:  No  Recommended Plan for Multiple Antipsychotic Therapies: Not applicable   Discharge Orders   Future Orders Complete By Expires     Diet - low sodium heart healthy  As directed     Discharge instructions  As directed     Comments:      Take all your medications as prescribed by your mental healthcare provider. Report any adverse effects and or reactions from your medicines to your outpatient provider promptly. Patient is instructed and cautioned to not engage in alcohol and or illegal drug use while on prescription medicines. In the event of worsening symptoms, patient is instructed to call the crisis hotline, 911 and or go to the nearest ED for appropriate evaluation and treatment of symptoms. Follow-up with your primary care provider for your other medical issues, concerns and or health care needs.    Increase activity slowly  As directed         Medication List    STOP taking these medications       QUEtiapine 300 MG 24 hr tablet  Commonly known as:  SEROQUEL XR      TAKE these medications     Indication   benztropine 2 MG tablet  Commonly known as:  COGENTIN  Take 1 tablet (2 mg total) by mouth 2 (two) times daily.   Indication:  Extrapyramidal Reaction caused by  Medications     divalproex 500 MG 24 hr tablet  Commonly known as:  DEPAKOTE ER  Take 3 tablets (1,500 mg total) by mouth at bedtime. For bipolar mania and mood stabilization.   Indication:  Depressive Phase of Manic-Depression, Manic Phase of Manic-Depression     haloperidol 10 MG tablet  Commonly known as:  HALDOL  Take 1 tablet (10 mg total) by mouth 2 (two) times daily. For mental clarity and mood stabilization.   Indication:  Psychosis     mirtazapine 15 MG tablet  Commonly known as:  REMERON  Take 1 tablet (15 mg total) by mouth at bedtime. For insomnia.   Indication:  Trouble Sleeping     omeprazole 40 MG capsule  Commonly known as:  PRILOSEC  Take 40 mg by mouth daily as needed. For acid reflux          Follow-up recommendations:  As noted above  Comments:    Total Discharge Time:  Greater than 30 minutes.  Signed: Rona Ravens. Gianpaolo Mindel PAC 12/09/2012, 9:38 AM

## 2012-12-09 NOTE — Progress Notes (Addendum)
Patient ID: Albert [name], male   DOB: Jul 25, 1976, 37 y.o.   MRN: [id number] Patient is requesting discharge home today.  Order written per MD to be discharged.  Patient will call for cab transportation after lunch.  He plans to go to [facility] after discharge.  He reports that he came here to "relax and calm down and I've met that to a certain degree."  When asked how he is supporting himself financially, he referred to [facility].  Patient remains disorganized when talking with him extensively.  He denies any SI/HI/AVH.  He lives on his own in an apartment and plans to return there.  Initiate paperwork for discharge this afternoon.  Continue 15 minute safety checks per protocol.  Support and encourage patient as needed.  Patient discharged home 1315.

## 2012-12-09 NOTE — Progress Notes (Signed)
D: Pt visible on milieu during evening shift.  Stated he had a "good day", despite increasing anxiety and irritability.  Affect defensive, mood anxious and suspicious.  Maintained only brief eye contact with anxious and pensive facial expression. Speech logical and coherent but Pt argumentative, defensive, and accused this RN of lying regarding an undecipherable topic- very tangential and paranoid theme that I could not follow.  Pt is extremely preoccupied with others' perceptions of him and believes himself to be badly misunderstood and maligned.  Demonstrated intermittent delusional thinking and paranoia.  At one point, when asked whether he was having any VH, he responded "It's really about you- you should question your motives for asking those questions because you really believe it about yourself and you're trying to make me look bad".  However, Pt denies SI/HI, AVH, tactile hallucinations, and acute pain.  Contracting for safety on unit.  Looking forward to discharge.  A: Provided support via quiet listening, as verbal responses acted as triggers for patient.  Reinforced his participation in and presence on milieu and support of peers.  No PRNs given.  All medications administered according to med orders and POC.  Q15 minute safety checks maintained as per unit protocol.  R: Pt seems to be regressing as discharge approaches but remains positive outwardly about leaving [facility].  Safety maintained. Dion Saucier RN

## 2012-12-09 NOTE — Progress Notes (Signed)
[facility] Adult Case Management Discharge Plan :  Will you be returning to the same living situation after discharge: Yes,  home At discharge, do you have transportation home?:Yes,  states he will call a cab Do you have the ability to pay for your medications:Yes,  insurance  Release of information consent forms completed and in the chart;  Patient's signature needed at discharge.  Patient to Follow up at: Follow-up Information   Follow up with [facility] On 12/29/2012. (11:30 with Dr Betti Cruz)    Contact information:   [address]  [address]  [phone]      Patient denies SI/HI:   Yes,  yes    Safety Planning and Suicide Prevention discussed:  Yes,  yes   [name] 12/09/2012, 10:41 AM

## 2012-12-09 NOTE — [facility] Suicide Risk Assessment (Signed)
Suicide Risk Assessment  Discharge Assessment     Demographic Factors:  Male, Low socioeconomic status and Unemployed  Mental Status Per Nursing Assessment::   On Admission:     Current Mental Status by Physician: patient denies suicidal ideation,intent or plan  Loss Factors: Decrease in vocational status and Financial problems/change in socioeconomic status  Historical Factors: NA  Risk Reduction Factors:   Positive social support and Positive therapeutic relationship  Continued Clinical Symptoms:  Resolving delusions and psychosis  Cognitive Features That Contribute To Risk:  Closed-mindedness Polarized thinking    Suicide Risk:  Minimal: No identifiable suicidal ideation.  Patients presenting with no risk factors but with morbid ruminations; may be classified as minimal risk based on the severity of the depressive symptoms  Discharge Diagnoses:   AXIS I:  Schizophrenia, Paranoid type              Alcohol abuse  AXIS II:  Deferred AXIS III:   Past Medical History  Diagnosis Date  . Seizures    AXIS IV:  other psychosocial or environmental problems and problems related to social environment AXIS V:  61-70 mild symptoms  Plan Of Care/Follow-up recommendations:  Activity:  as tolerated Diet:  healthy. Tests:  Valproic acid Other:  patient to keep his after care appointment.  Is patient on multiple antipsychotic therapies at discharge:  No   Has Patient had three or more failed trials of antipsychotic monotherapy by history:  No  Recommended Plan for Multiple Antipsychotic Therapies: N/A  Fleet Higham,MD 12/09/2012, 10:31 AM

## 2012-12-15 NOTE — Progress Notes (Signed)
Patient Discharge Instructions:  After Visit Summary (AVS):   Faxed to:  12/15/12 Psychiatric Admission Assessment Note:   Faxed to:  12/15/12 Suicide Risk Assessment - Discharge Assessment:   Faxed to:  12/15/12 Faxed/Sent to the Next Level Care provider:  12/15/12 Faxed to [facility] @ [phone] Jerelene Redden, 12/15/2012, 3:30 PM

## 2012-12-17 NOTE — Discharge Summary (Signed)
Seen and agreed. Mayana Irigoyen, MD 

## 2013-01-07 ENCOUNTER — Encounter (HOSPITAL_COMMUNITY): Payer: Self-pay | Admitting: *Deleted

## 2013-01-07 ENCOUNTER — Emergency Department (HOSPITAL_COMMUNITY)
Admission: EM | Admit: 2013-01-07 | Discharge: 2013-01-08 | Disposition: A | Discharge status: Home / Self Care | Payer: Medicare Other | Attending: Emergency Medicine | Admitting: Emergency Medicine

## 2013-01-07 DIAGNOSIS — Z79899 Other long term (current) drug therapy: Secondary | ICD-10-CM | POA: Insufficient documentation

## 2013-01-07 DIAGNOSIS — Z87891 Personal history of nicotine dependence: Secondary | ICD-10-CM | POA: Insufficient documentation

## 2013-01-07 DIAGNOSIS — Z8669 Personal history of other diseases of the nervous system and sense organs: Secondary | ICD-10-CM | POA: Insufficient documentation

## 2013-01-07 DIAGNOSIS — F200 Paranoid schizophrenia: Secondary | ICD-10-CM | POA: Insufficient documentation

## 2013-01-07 DIAGNOSIS — F1020 Alcohol dependence, uncomplicated: Secondary | ICD-10-CM | POA: Insufficient documentation

## 2013-01-07 DIAGNOSIS — F411 Generalized anxiety disorder: Secondary | ICD-10-CM | POA: Insufficient documentation

## 2013-01-07 DIAGNOSIS — F419 Anxiety disorder, unspecified: Secondary | ICD-10-CM

## 2013-01-07 DIAGNOSIS — G479 Sleep disorder, unspecified: Secondary | ICD-10-CM | POA: Insufficient documentation

## 2013-01-07 MED ORDER — IBUPROFEN 600 MG PO TABS: 600.0000 mg | ORAL_TABLET | Freq: Three times a day (TID) | ORAL | Status: DC | PRN

## 2013-01-07 MED ORDER — LORAZEPAM 1 MG PO TABS
1.0000 mg | ORAL_TABLET | Freq: Three times a day (TID) | ORAL | Status: DC | PRN
  Administered 2013-01-08: 1 mg via ORAL
  Filled 2013-01-07: qty 1

## 2013-01-07 MED ORDER — NICOTINE 21 MG/24HR TD PT24: 21.0000 mg | MEDICATED_PATCH | Freq: Every day | TRANSDERMAL | Status: DC

## 2013-01-07 MED ORDER — ONDANSETRON HCL 4 MG PO TABS: 4.0000 mg | ORAL_TABLET | Freq: Three times a day (TID) | ORAL | Status: DC | PRN

## 2013-01-07 MED ORDER — ACETAMINOPHEN 325 MG PO TABS: 650.0000 mg | ORAL_TABLET | ORAL | Status: DC | PRN

## 2013-01-07 MED ORDER — ZOLPIDEM TARTRATE 5 MG PO TABS
5.0000 mg | ORAL_TABLET | Freq: Every evening | ORAL | Status: DC | PRN
  Administered 2013-01-08: 5 mg via ORAL
  Filled 2013-01-07: qty 1

## 2013-01-07 MED ORDER — ALUM & MAG HYDROXIDE-SIMETH 200-200-20 MG/5ML PO SUSP: 30.0000 mL | ORAL | Status: DC | PRN

## 2013-01-07 NOTE — ED Notes (Signed)
Per ems c/o anxiety x 2 days; pt states he did not get rx filled from Dr Betti Cruz; denies hi/si; requests to be treated at [facility];

## 2013-01-08 LAB — RAPID URINE DRUG SCREEN, HOSP PERFORMED
Amphetamines: NOT DETECTED
Barbiturates: NOT DETECTED
Benzodiazepines: NOT DETECTED
Cocaine: NOT DETECTED
Opiates: NOT DETECTED
Tetrahydrocannabinol: NOT DETECTED

## 2013-01-08 LAB — CBC WITH DIFFERENTIAL/PLATELET
Lymphocytes Relative: 18 % (ref 12–46)
Lymphs Abs: 2.3 10*3/uL (ref 0.7–4.0)
MCV: 88.3 fL (ref 78.0–100.0)
Neutro Abs: 8.6 10*3/uL — ABNORMAL HIGH (ref 1.7–7.7)
Neutrophils Relative %: 67 % (ref 43–77)
Platelets: 291 10*3/uL (ref 150–400)
RBC: 4.86 MIL/uL (ref 4.22–5.81)
WBC: 12.9 10*3/uL — ABNORMAL HIGH (ref 4.0–10.5)

## 2013-01-08 LAB — BASIC METABOLIC PANEL
BUN: 7 mg/dL (ref 6–23)
Calcium: 9.6 mg/dL (ref 8.4–10.5)
GFR calc non Af Amer: >90 mL/min (ref >90)
Glucose, Bld: 111 mg/dL — ABNORMAL HIGH (ref 70–99)

## 2013-01-08 LAB — ETHANOL: Alcohol, Ethyl (B): <11 mg/dL (ref 0–11)

## 2013-01-08 MED ORDER — LORAZEPAM 1 MG PO TABS: 1.0000 mg | ORAL_TABLET | Freq: Three times a day (TID) | ORAL | Status: DC | PRN

## 2013-01-08 NOTE — ED Provider Notes (Signed)
Pt has received ativan, and reports he is feeling much better.  He is requesting to go home and/or have a telepsych.  As patient was not si/hi, and not acutely psychotic, will allow to go home and f/u with his psychiatrist  Olivia Mackie, MD 01/08/13 [phone]

## 2013-01-08 NOTE — ED Provider Notes (Signed)
History     CSN: [id number]  Arrival date & time 01/07/13  2325   First MD Initiated Contact with Patient 01/07/13 2336      Chief Complaint  Patient presents with  . Medical Clearance    (Consider location/radiation/quality/duration/timing/severity/associated sxs/prior treatment) HPI Comments: 37 y.o. Male presents to ED wanting relief from what he describes as "severe anxiety" for the past two days. Pt states he wants to be treated at [facility]. PMHx significant for paranoid schizophrenia, seizures,  alcoholism, and drug abuse. Pt states he has not been able to call his doctor for his meds because he does not have a phone and "just needs help." He states he sees Dr. Betti [name] for his psychological issues, and saw him about two weeks ago. Pt denies SI/HI, auditory/visual hallucinations. Pt denies any alcohol or drug use today. Pt has no physical complaints today, no pain, and denies recent fever. States he "was going to drink a fifth, but I didn't." He also denies any other drug use today.   Pt behavior prior to my arrival included his pacing in the doorway of the treatment room and in the hallway, asking for the door to be open, and stating that he "just wants to be with people." Security and nursing staff exerted a great deal of effort to keep him contained.   Pt has multiple admissions to [facility] in the past. Most recently this past January and March of 2013 prior to that.    Past Medical History  Diagnosis Date  . Drug abuse     xanax addiction 5 years ago  . Seizures   . Paranoid schizophrenia   . Alcoholism   . Medical non-compliance     History reviewed. No pertinent past surgical history.  No family history on file.  History  Substance Use Topics  . Smoking status: Former Games developer  . Smokeless tobacco: Not on file  . Alcohol Use: No      Review of Systems  Constitutional: Negative for fever and diaphoresis.  HENT: Negative for neck pain and neck stiffness.   Eyes: Negative  for visual disturbance.  Respiratory: Negative for shortness of breath.   Cardiovascular: Negative for chest pain.  Gastrointestinal: Negative for nausea, vomiting, abdominal pain, diarrhea and constipation.  Genitourinary: Negative for dysuria and flank pain.  Musculoskeletal: Negative for gait problem.  Skin: Negative for rash and wound.  Neurological: Negative for dizziness, weakness, light-headedness, numbness and headaches.  Psychiatric/Behavioral: Positive for sleep disturbance. Negative for suicidal ideas and hallucinations. The patient is nervous/anxious.     Allergies  Review of patient's allergies indicates no known allergies.  Home Medications   Current Outpatient Rx  Name  Route  Sig  Dispense  Refill  . benztropine (COGENTIN) 2 MG tablet   Oral   Take 1 tablet (2 mg total) by mouth 2 (two) times daily.   60 tablet   0   . divalproex (DEPAKOTE ER) 500 MG 24 hr tablet   Oral   Take 3 tablets (1,500 mg total) by mouth at bedtime. For bipolar mania and mood stabilization.   90 tablet   0   . haloperidol (HALDOL) 10 MG tablet   Oral   Take 1 tablet (10 mg total) by mouth 2 (two) times daily. For mental clarity and mood stabilization.   60 tablet   0   . mirtazapine (REMERON) 15 MG tablet   Oral   Take 1 tablet (15 mg total) by mouth at bedtime. For insomnia.  30 tablet   0   . omeprazole (PRILOSEC) 40 MG capsule   Oral   Take 40 mg by mouth daily as needed. For acid reflux           BP 132/89  Pulse 112  Temp(Src) 98 F (36.7 C) (Oral)  Resp 18  SpO2 97%  Physical Exam  Nursing note and vitals reviewed. Constitutional: He is oriented to person, place, and time. No distress.  Disheveled  HENT:  Head: Normocephalic and atraumatic.  Eyes: Conjunctivae and EOM are normal. Pupils are equal, round, and reactive to light.  Neck: Normal range of motion. Neck supple.  No meningeal signs  Cardiovascular: Normal rate, regular rhythm, normal heart sounds  and intact distal pulses.   Pulmonary/Chest: Effort normal and breath sounds normal. No respiratory distress. He has no wheezes. He has no rales. He exhibits no tenderness.  Abdominal: Soft. Bowel sounds are normal. There is no tenderness.  Musculoskeletal: Normal range of motion. He exhibits no edema and no tenderness.  5/5 strength throughout  Neurological: He is alert and oriented to person, place, and time. No cranial nerve deficit.  No focal deficits.   Skin: Skin is warm and dry. He is not diaphoretic. No erythema.  Psychiatric:  Pt would not look me in the eye during exam, always shifting, looking around, fidgeting, very anxious.     ED Course  Procedures (including critical care time)  Labs Reviewed  CBC WITH DIFFERENTIAL - Abnormal; Notable for the following:    WBC 12.9 (*)    MCHC 36.6 (*)    Neutro Abs 8.6 (*)    Monocytes Relative 15 (*)    Monocytes Absolute 1.9 (*)    All other components within normal limits  BASIC METABOLIC PANEL - Abnormal; Notable for the following:    Sodium 132 (*)    Chloride 94 (*)    Glucose, Bld 111 (*)    All other components within normal limits  ETHANOL  URINE RAPID DRUG SCREEN (HOSP PERFORMED)   No results found.   No diagnosis found.    MDM  Pt did not express SI/HI, auditory/visual hallucinations. Prior admissions to [facility] including 12/2011, 10/2012 where it was noted that pt was minimal to moderate suicide risk, but actively psychotic with poor judgement, insight and impulse control.  The patient's demeanor is anxious, slightly paranoid.  The patient currently does not have any acute physical complaints and is in no acute distress. ACT consult was appreciated and pt was moved to Psych ED for further evaluation.       Glade Nurse, PA-C 01/08/13 [phone]

## 2013-01-08 NOTE — ED Provider Notes (Signed)
Medical screening examination/treatment/procedure(s) were performed by non-physician practitioner and as supervising physician I was immediately available for consultation/collaboration.  Flint Melter, MD 01/08/13 1524

## 2013-01-08 NOTE — BH Assessment (Signed)
Assessment Note   Albert [name] is an 37 y.o. male. Patient states that for the past 5-6 days he has felt unbalanced here ( he placed his hands over his heart). He states that he feels okay now. He states that he has not slept in several days and really has had an appetite. States that he had a recent medication change from Dr. Betti Cruz but when he went to pick it up from the pharmacy it was not ready so he thought maybe he didn't need to take the medication and has returned to pick it up. Patient now realizes that he needs the medication and feels steady enough to go home. He was also able to eat after receiving anxiety medication. Patient denies suicidality, homicidality and auditory and visual hallucinations. He does not criteria for inpatient stabilization and wants to go home. Discussed with EDP who agrees with disposition.  Axis I: Paranoid Schizophrenia Axis II: Deferred Axis III:  Past Medical History  Diagnosis Date  . Drug abuse     xanax addiction 5 years ago  . Seizures   . Paranoid schizophrenia   . Alcoholism   . Medical non-compliance    Axis IV: Medication Issues Axis V: 61-70 mild symptoms  Past Medical History:  Past Medical History  Diagnosis Date  . Drug abuse     xanax addiction 5 years ago  . Seizures   . Paranoid schizophrenia   . Alcoholism   . Medical non-compliance     History reviewed. No pertinent past surgical history.  Family History: No family history on file.  Social History:  reports that he has quit smoking. He does not have any smokeless tobacco history on file. He reports that he does not drink alcohol or use illicit drugs.  Additional Social History:  Alcohol / Drug Use History of alcohol / drug use?: No history of alcohol / drug abuse  CIWA: CIWA-Ar BP: 132/89 mmHg Pulse Rate: 112 COWS:    Allergies: No Known Allergies  Home Medications:  (Not in a hospital admission)  OB/GYN Status:  No LMP for male patient.  General  Assessment Data [facility] of [facility]: [facility] Living Arrangements: Alone Can pt return to current living arrangement?: Yes Admission Status: Voluntary Is patient capable of signing voluntary admission?: Yes Transfer from: Home Referral Source: MD  Education Status Is patient currently in school?: No  Risk to self Suicidal Ideation: No Suicidal Intent: No Is patient at risk for suicide?: No Suicidal Plan?: No Access to Means: No What has been your use of drugs/alcohol within the last 12 months?:  (Denies) Previous Attempts/Gestures: No How many times?:  (None noted) Other Self Harm Risks:  (None noted) Triggers for Past Attempts: None known Intentional Self Injurious Behavior: None Family Suicide History: No (Mother and aunts / depression) Recent stressful life event(s): Other (Comment) (change in medications) Persecutory voices/beliefs?: No Depression: No Substance abuse history and/or treatment for substance abuse?: No Suicide prevention information given to non-admitted patients: Not applicable  Risk to Others Homicidal Ideation: No Thoughts of Harm to Others: No Current Homicidal Intent: No Current Homicidal Plan: No Access to Homicidal Means: No Identified Victim:  (Na) History of harm to others?: No Assessment of Violence: None Noted Violent Behavior Description:  (Na) Does patient have access to weapons?: No Criminal Charges Pending?: No Does patient have a court date: Yes Court Date:  (01/19/13; riding bike on side walk)  Psychosis Hallucinations: None noted Delusions: None noted  Mental Status Report Appear/Hygiene:  (  WNL) Eye Contact: Good Motor Activity: Freedom of movement;Unremarkable Speech: Logical/coherent Level of Consciousness: Alert Mood: Anxious Affect: Appropriate to circumstance Anxiety Level: Minimal Thought Processes: Coherent;Relevant Judgement: Unimpaired Orientation: Person;Place;Time;Situation Obsessive Compulsive  Thoughts/Behaviors: None  Cognitive Functioning Concentration: Normal Memory: Recent Intact;Remote Intact IQ: Average Insight: Good Impulse Control: Good Appetite: Good Weight Loss:  (None noted) Weight Gain:  (None noted) Sleep: Decreased Total Hours of Sleep:  (none past 5-6 days) Vegetative Symptoms: None  ADLScreening [facility] Assessment Services) Patient's cognitive ability adequate to safely complete daily activities?: Yes Patient able to express need for assistance with ADLs?: Yes Independently performs ADLs?: Yes (appropriate for developmental age)  Abuse/Neglect [facility]) Physical Abuse: Denies Verbal Abuse: Denies Sexual Abuse: Denies  Prior Inpatient Therapy Prior Inpatient Therapy: Yes Prior Therapy Dates:  (10/2012) Prior Therapy Facilty/Casy Brunetto(s): [place], [facility], and others Reason for Treatment: psychosis  Prior Outpatient Therapy Prior Outpatient Therapy: Yes Prior Therapy Dates: currently Prior Therapy Facilty/Scheryl Sanborn(s): Dr. Betti Cruz Reason for Treatment: psychosis  ADL Screening (condition at time of admission) Patient's cognitive ability adequate to safely complete daily activities?: Yes Patient able to express need for assistance with ADLs?: Yes Independently performs ADLs?: Yes (appropriate for developmental age) Weakness of Legs: None Weakness of Arms/Hands: None       Abuse/Neglect Assessment (Assessment to be complete while patient is alone) Physical Abuse: Denies Verbal Abuse: Denies Sexual Abuse: Denies Exploitation of patient/patient's resources: Denies Self-Neglect: Denies Values / Beliefs Cultural Requests During Hospitalization: None Spiritual Requests During Hospitalization: None        Additional Information 1:1 In Past 12 Months?: No CIRT Risk: No Elopement Risk: No Does patient have medical clearance?: Yes     Disposition:  Disposition Initial Assessment Completed: Yes Disposition of Patient: Outpatient  treatment;Other dispositions Type of outpatient treatment: Adult Other disposition(s): To current An[facility]  On Site Evaluation by:   Reviewed with Physician:  Dr. Cleotilde Neer, [facility] M 01/08/2013 3:18 AM

## 2013-01-08 NOTE — ED Notes (Signed)
Patient arrived to unit, but appears paranoid and refusing to enter his room. Pt looking all around him and is fearful. Pt repeatedly asking if door can stay open. Pt needing much redirection of security and RN before entering room. Pt wringing his hands and is obviously anxious. Pt states "I just need help". Pt not elaborating on his reason for coming to ED. Pt denies SI/HI at this time.

## 2013-01-22 ENCOUNTER — Telehealth (HOSPITAL_COMMUNITY): Payer: Self-pay | Admitting: Licensed Clinical Social Worker

## 2013-01-22 ENCOUNTER — Emergency Department (HOSPITAL_COMMUNITY)
Admission: EM | Admit: 2013-01-22 | Discharge: 2013-01-22 | Disposition: A | Discharge status: Other Acute Inpatient Hospital | Payer: Medicare Other | Source: Home / Self Care | Attending: Emergency Medicine | Admitting: Emergency Medicine

## 2013-01-22 ENCOUNTER — Encounter (HOSPITAL_COMMUNITY): Payer: Self-pay | Admitting: *Deleted

## 2013-01-22 ENCOUNTER — Inpatient Hospital Stay (HOSPITAL_COMMUNITY)
Admission: AD | Admit: 2013-01-22 | Discharge: 2013-01-27 | DRG: 885 | Disposition: A | Discharge status: Home / Self Care | Payer: Medicare Other | Source: Intra-hospital | Attending: Psychiatry | Admitting: Psychiatry

## 2013-01-22 ENCOUNTER — Encounter (HOSPITAL_COMMUNITY): Payer: Self-pay

## 2013-01-22 DIAGNOSIS — G40909 Epilepsy, unspecified, not intractable, without status epilepticus: Secondary | ICD-10-CM | POA: Insufficient documentation

## 2013-01-22 DIAGNOSIS — F200 Paranoid schizophrenia: Principal | ICD-10-CM | POA: Diagnosis present

## 2013-01-22 DIAGNOSIS — Z008 Encounter for other general examination: Secondary | ICD-10-CM | POA: Insufficient documentation

## 2013-01-22 DIAGNOSIS — Z79899 Other long term (current) drug therapy: Secondary | ICD-10-CM | POA: Insufficient documentation

## 2013-01-22 DIAGNOSIS — Z91199 Patient's noncompliance with other medical treatment and regimen due to unspecified reason: Secondary | ICD-10-CM

## 2013-01-22 DIAGNOSIS — Z9119 Patient's noncompliance with other medical treatment and regimen: Secondary | ICD-10-CM | POA: Insufficient documentation

## 2013-01-22 DIAGNOSIS — Z87891 Personal history of nicotine dependence: Secondary | ICD-10-CM | POA: Insufficient documentation

## 2013-01-22 LAB — RAPID URINE DRUG SCREEN, HOSP PERFORMED
Amphetamines: NOT DETECTED
Cocaine: NOT DETECTED
Opiates: NOT DETECTED
Tetrahydrocannabinol: NOT DETECTED

## 2013-01-22 LAB — COMPREHENSIVE METABOLIC PANEL
ALT: 9 U/L (ref 0–53)
AST: 16 U/L (ref 0–37)
Albumin: 3.7 g/dL (ref 3.5–5.2)
Calcium: 9.5 mg/dL (ref 8.4–10.5)
Sodium: 138 mEq/L (ref 135–145)
Total Protein: 7.5 g/dL (ref 6.0–8.3)

## 2013-01-22 LAB — CBC
MCH: 31.6 pg (ref 26.0–34.0)
MCHC: 34.6 g/dL (ref 30.0–36.0)
Platelets: 223 10*3/uL (ref 150–400)

## 2013-01-22 MED ORDER — NICOTINE 21 MG/24HR TD PT24: 21.0000 mg | MEDICATED_PATCH | Freq: Every day | TRANSDERMAL | Status: DC

## 2013-01-22 MED ORDER — DIVALPROEX SODIUM ER 500 MG PO TB24
1500.0000 mg | ORAL_TABLET | Freq: Every day | ORAL | Status: DC
  Filled 2013-01-22: qty 3

## 2013-01-22 MED ORDER — ACETAMINOPHEN 325 MG PO TABS: 650.0000 mg | ORAL_TABLET | Freq: Four times a day (QID) | ORAL | Status: DC | PRN

## 2013-01-22 MED ORDER — LORAZEPAM 1 MG PO TABS: 1.0000 mg | ORAL_TABLET | Freq: Three times a day (TID) | ORAL | Status: DC | PRN

## 2013-01-22 MED ORDER — ACETAMINOPHEN 325 MG PO TABS: 650.0000 mg | ORAL_TABLET | ORAL | Status: DC | PRN

## 2013-01-22 MED ORDER — PANTOPRAZOLE SODIUM 40 MG PO TBEC
80.0000 mg | DELAYED_RELEASE_TABLET | Freq: Every day | ORAL | Status: DC
  Administered 2013-01-22: 80 mg via ORAL
  Filled 2013-01-22: qty 2

## 2013-01-22 MED ORDER — BENZTROPINE MESYLATE 2 MG PO TABS
2.0000 mg | ORAL_TABLET | Freq: Two times a day (BID) | ORAL | Status: DC
  Administered 2013-01-22 – 2013-01-23 (×2): 2 mg via ORAL
  Filled 2013-01-22 (×3): qty 1
  Filled 2013-01-22: qty 2
  Filled 2013-01-22 (×2): qty 1
  Filled 2013-01-22: qty 2
  Filled 2013-01-22: qty 1

## 2013-01-22 MED ORDER — ALUM & MAG HYDROXIDE-SIMETH 200-200-20 MG/5ML PO SUSP: 30.0000 mL | ORAL | Status: DC | PRN

## 2013-01-22 MED ORDER — ONDANSETRON HCL 4 MG PO TABS: 4.0000 mg | ORAL_TABLET | Freq: Three times a day (TID) | ORAL | Status: DC | PRN

## 2013-01-22 MED ORDER — MIRTAZAPINE 30 MG PO TABS: 15.0000 mg | ORAL_TABLET | Freq: Every evening | ORAL | Status: DC | PRN

## 2013-01-22 MED ORDER — DIVALPROEX SODIUM ER 500 MG PO TB24
1500.0000 mg | ORAL_TABLET | Freq: Every day | ORAL | Status: DC
  Administered 2013-01-22: 1500 mg via ORAL
  Filled 2013-01-22 (×4): qty 3

## 2013-01-22 MED ORDER — QUETIAPINE FUMARATE ER 300 MG PO TB24
300.0000 mg | ORAL_TABLET | Freq: Every day | ORAL | Status: DC
  Filled 2013-01-22: qty 1

## 2013-01-22 MED ORDER — PANTOPRAZOLE SODIUM 40 MG PO TBEC
80.0000 mg | DELAYED_RELEASE_TABLET | Freq: Every day | ORAL | Status: DC
  Administered 2013-01-23 – 2013-01-27 (×5): 80 mg via ORAL
  Filled 2013-01-22 (×8): qty 2

## 2013-01-22 MED ORDER — IBUPROFEN 600 MG PO TABS: 600.0000 mg | ORAL_TABLET | Freq: Three times a day (TID) | ORAL | Status: DC | PRN

## 2013-01-22 MED ORDER — HALOPERIDOL 5 MG PO TABS
10.0000 mg | ORAL_TABLET | Freq: Two times a day (BID) | ORAL | Status: DC
  Administered 2013-01-22 – 2013-01-23 (×2): 10 mg via ORAL
  Filled 2013-01-22 (×8): qty 2

## 2013-01-22 MED ORDER — MAGNESIUM HYDROXIDE 400 MG/5ML PO SUSP: 30.0000 mL | Freq: Every day | ORAL | Status: DC | PRN
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
## 2013-01-22 NOTE — ED Provider Notes (Signed)
Medical screening examination/treatment/procedure(s) were performed by non-physician practitioner and as supervising physician I was immediately available for consultation/collaboration. Devoria Albe, MD, FACEP   Ward Givens, MD 01/22/13 [phone]

## 2013-01-22 NOTE — ED Notes (Signed)
Telepsych cancelled since pt accepted at [facility].

## 2013-01-22 NOTE — ED Notes (Signed)
Security in to wand patient and search belonging bags. 

## 2013-01-22 NOTE — ED Notes (Signed)
Sitter at bedside.

## 2013-01-22 NOTE — ED Notes (Signed)
in w/ pt
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
## 2013-01-22 NOTE — ED Notes (Signed)
Pt given his shoes to transport to [facility] for further treatment. Being transported via security and a tech. A bookbag and a personal belongings bag given to security to transport. Pt has blanket on shoulders.
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
## 2013-01-23 MED ORDER — QUETIAPINE FUMARATE ER 300 MG PO TB24
600.0000 mg | ORAL_TABLET | Freq: Every day | ORAL | Status: DC
  Administered 2013-01-23 – 2013-01-26 (×4): 600 mg via ORAL
  Filled 2013-01-23 (×6): qty 2

## 2013-01-23 MED ORDER — DIVALPROEX SODIUM ER 500 MG PO TB24
1500.0000 mg | ORAL_TABLET | Freq: Every day | ORAL | Status: DC
  Administered 2013-01-23 – 2013-01-26 (×4): 1500 mg via ORAL
  Filled 2013-01-23 (×6): qty 3

## 2013-01-23 MED ORDER — BENZTROPINE MESYLATE 2 MG PO TABS
2.0000 mg | ORAL_TABLET | Freq: Two times a day (BID) | ORAL | Status: DC
  Administered 2013-01-23 – 2013-01-27 (×8): 2 mg via ORAL
  Filled 2013-01-23 (×12): qty 1

## 2013-01-23 NOTE — Progress Notes (Signed)
Patient ID: Albert [name], male   DOB: May 11, 1976, 37 y.o.   MRN: [id number]  D: Pt denies SI/HI/AVH. Pt is pleasant and cooperative, but was very sleep since he got Seroquel this evening. Pt spent most of night in room, but his speech was logical/coherant and not manic or loose associations like yesterday. Pt very calm and kept to himself tonight.  A: Pt was offered support and encouragement.  Pt was encourage to attend groups. Q 15 minute checks were done for safety.   R:.Pt receptive to treatment and safety maintained on unit.

## 2013-01-23 NOTE — Progress Notes (Signed)
Recreation Therapy Notes  Date: 03.28.2014      Time: 9:30am Location: [address] Day Room      Group Topic/Focus: Self Expression  Participation Level: Did not attend  Jearl Klinefelter, LRT/CTRS  Jearl Klinefelter 01/23/2013 10:19 AM

## 2013-01-23 NOTE — Clinical Social Work Note (Signed)
[facility] Group Notes:  (Counselor/Nursing/MHT/Case Management/Adjunct)  09/12/2012 12:00 PM  Type of Therapy:  Group Therapy  Participation Level:  Did Not Attend  Smart, Heather N 09/12/2012, 12:00 PM  
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
## 2013-01-23 NOTE — Progress Notes (Signed)
Patient ID: Albert [name], male   DOB: 1976/09/27, 37 y.o.   MRN: [id number] Admission Note:  D:36 yr male who presents [facility] in no acute distress for the management of his medications and treatment of  SI and Depression. Pt appears flat and depressed. Pt was calm and cooperative with admission process.Pt contracts for safety upon admission. Pt  Currently denies SI/HI/AVH and any pain . Pt presents with Flight of ideas, Tangential speech, and loose associations.  Pt has Past medical Hx of Drug abuse, Seizures, Etoh abuse, and MED non-compliance .  A: 15 min checks for safety started. Skin was assessed and found to be clear of any abnormal marks apart from various moles/skin tags on the body. POC and unit policies explained and understanding verbalized. Consents obtained. Food and fluids offered, and  Accepted.  Support and encouragement provided  Scheduled medication given.   R: Pt had no additional questions or concerns.
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
## 2013-01-23 NOTE — Progress Notes (Signed)
Nutrition Brief Note  Patient identified on the Malnutrition Screening Tool (MST) Report  Body mass index is 30.71 kg/(m^2). Patient meets criteria for obesity grade 1 based on current BMI.   Current diet order is regular, patient's intake is good at this time. Labs and medications reviewed.   Patient reported a UBW of 245 lbs 1 year ago with resulting weight loss to 214 lbs in the past year.  States that he was trying to lose weight and began walking/biking more.  No nutrition interventions warranted at this time. If nutrition issues arise, please consult RD.   Oran Rein, RD, LDN Clinical Inpatient Dietitian Pager:  [phone] Weekend and after hours pager:  [phone]

## 2013-01-23 NOTE — [facility] Suicide Risk Assessment (Signed)
Suicide Risk Assessment  Admission Assessment     Nursing information obtained from:    Demographic factors:    Current Mental Status:    Loss Factors:    Historical Factors:    Risk Reduction Factors:     CLINICAL FACTORS:   Schizophrenia:   Paranoid or undifferentiated type Previous Psychiatric Diagnoses and Treatments  COGNITIVE FEATURES THAT CONTRIBUTE TO RISK:  Closed-mindedness Polarized thinking    SUICIDE RISK:   Minimal: No identifiable suicidal ideation.  Patients presenting with no risk factors but with morbid ruminations; may be classified as minimal risk based on the severity of the depressive symptoms  PLAN OF CARE: 1. Admit for crisis management and stabilization. 2. Medication management to reduce current symptoms to base line and improve the patient's overall level of functioning 3. Treat health problems as indicated. 4. Develop treatment plan to decrease risk of relapse upon discharge and the need for readmission. 5. Psycho-social education regarding relapse prevention and self care. 6. Health care follow up as needed for medical problems. 7. Restart home medications where appropriate.   I certify that inpatient services furnished can reasonably be expected to improve the patient's condition.  Wyvonne Carda,MD 01/23/2013, 9:55 AM

## 2013-01-24 DIAGNOSIS — F200 Paranoid schizophrenia: Principal | ICD-10-CM

## 2013-01-24 NOTE — Progress Notes (Signed)
D   Pt spent most of the morning in bed  He did go to the cafeteria for meals  He is pleasant on approach  He remains delusional and preoccupied  He said he slept much better last night due to being started back on his medications A   Verbal support given  Medications administered and effectiveness monitored   Q 15 min checks R   Pt safe at present

## 2013-01-24 NOTE — Clinical Social Work Note (Signed)
[facility] Group Notes: (Clinical Social Work)   01/24/2013      Type of Therapy:  Group Therapy   Participation Level:  Did Not Attend    Ambrose Mantle, LCSW 01/24/2013, 12:09 PM

## 2013-01-24 NOTE — Progress Notes (Signed)
Patient ID: Albert [name], male   DOB: [birth year], 37 y.o.   MRN: [id number]   S:  Seen today. Sleepy. Was very angry. Refused to talk and give reasons for admission today. Says he is fine.  General Appearance: Disheveled   Eye Solicitor:: Fair   Speech: Pressured   Volume: Normal   Mood: Anxious   Affect: Labile   Thought Process: Circumstantial, Disorganized and Loose   Orientation: NA   Thought Content: denies Delusions, Hallucinations:   Suicidal Thoughts: No   Homicidal Thoughts: No   Memory: NA   Judgement: Impaired   Insight: Lacking   Psychomotor Activity: Increased   Concentration: Poor   Recall: Poor   Akathisia: No   Handed: Right   AIMS (if indicated):   Assets: Merchant navy officer  Housing  Physical Health  Social Support   Sleep: Number of Hours: 5    Past Medical History   Diagnosis  Date    Assessment:  AXIS I: Schizophrenia, paranoid type  AXIS II: Deferred  AXIS III:  Past Medical History   Diagnosis  Date   .  Drug abuse      xanax addiction 5 years ago   .  Seizures    .  Paranoid schizophrenia    .  Alcoholism    .  Medical non-compliance     AXIS IV: problems with access to health care services and problems with primary support group  AXIS V: 41-50 serious symptoms   Treatment Plan/Recommendations:    Continue current meds

## 2013-01-24 NOTE — Progress Notes (Signed)
Patient ID: Albert [name], male   DOB: 09-11-1976, 37 y.o.   MRN: [id number] Psychoeducational Group Note  Date:  01/24/2013 Time:0930am  Group Topic/Focus:  Identifying Needs:   The focus of this group is to help patients identify their personal needs that have been historically problematic and identify healthy behaviors to address their needs.  Participation Level:  Did Not Attend  Participation Quality:    Affect:   Cognitive:   Insight Engagement in Group:  Additional Comments:  Inventory group   Valente David 01/24/2013,10:05 AM

## 2013-01-25 DIAGNOSIS — Z9119 Patient's noncompliance with other medical treatment and regimen: Secondary | ICD-10-CM

## 2013-01-25 MED ORDER — IBUPROFEN 600 MG PO TABS
600.0000 mg | ORAL_TABLET | Freq: Four times a day (QID) | ORAL | Status: DC | PRN
  Administered 2013-01-25 – 2013-01-26 (×5): 600 mg via ORAL
  Filled 2013-01-25 (×5): qty 1

## 2013-01-25 NOTE — Progress Notes (Signed)
Patient ID: Albert [name], male   DOB: 27-Jun-1976, 37 y.o.   MRN: [id number] D. The patient appears bright and social, but the content of his speech expresses paranoid, delusional thoughts. His thoughts are loose with flight of ideas. During evening group he stated that he wasn't sure which name he preferred to be called, Albert [name] or Albert [name].  A. Encouraged the patient to attend evening group. Verbal support given. Reviewed his new medication times with him. Administered HS medications. R. Compliant with medication.

## 2013-01-25 NOTE — Progress Notes (Signed)
Patient ID: Albert [name], male   DOB: 25-Sep-1976, 37 y.o.   MRN: [id number] 01-25-13 nursing shift note: d: pt has been visible in the milieu, interacting and participating in group. He came to the medication window for medications this am. He had no complaints of pain and is pleasant on approach. A: staff will encourage and support him. Thus far he has required no prn's. R: he denied any SI. He still remains delusional. RN will monitor and Q 15 min ck's continue.

## 2013-01-25 NOTE — Progress Notes (Signed)
Patient ID: Albert [name], male   DOB: 1976-07-31, 37 y.o.   MRN: [id number] Psychoeducational Group Note  Date:  01/25/2013 Time:  0930am  Group Topic/Focus:  Making Healthy Choices:   The focus of this group is to help patients identify negative/unhealthy choices they were using prior to admission and identify positive/healthier coping strategies to replace them upon discharge.  Participation Level:  Active  Participation Quality:  Appropriate  Affect:  Appropriate  Cognitive:  Appropriate  Insight:  Supportive  Engagement in Group:  Supportive  Additional Comments:  Inventory group and Psychoeducational group   Albert [name] 01/25/2013,10:40 AM

## 2013-01-25 NOTE — Progress Notes (Signed)
[facility] MD Progress Note  01/25/2013 4:58 PM Albert [name]  MRN:  [id number] Subjective:  "I'm good, good."  Objective: Patient is still having some pressured speech, is quite tangential, but redirectable. Still concerned about the airplane. Diagnosis:  Schizophrenia, paranoid type                      Medical non-compliance ADL's:  Intact  Sleep: Good  Appetite:  Good  Suicidal Ideation:  denies Homicidal Ideation:  denies AEB (as evidenced by): Patient's response to direct questioning, reports of improved symptoms including sleep, appetite, and mood and affect.  Psychiatric Specialty Exam: Review of Systems  Constitutional: Negative.  Negative for fever, chills, weight loss, malaise/fatigue and diaphoresis.  HENT: Negative for congestion and sore throat.   Eyes: Negative for blurred vision, double vision and photophobia.  Respiratory: Negative for cough, shortness of breath and wheezing.   Cardiovascular: Negative for chest pain, palpitations and PND.  Gastrointestinal: Negative for heartburn, nausea, vomiting, abdominal pain, diarrhea and constipation.  Musculoskeletal: Negative for myalgias, joint pain and falls.  Neurological: Negative for dizziness, tingling, tremors, sensory change, speech change, focal weakness, seizures, loss of consciousness, weakness and headaches.  Endo/Heme/Allergies: Negative for polydipsia. Does not bruise/bleed easily.  Psychiatric/Behavioral: Negative for depression, suicidal ideas, hallucinations, memory loss and substance abuse. The patient is not nervous/anxious and does not have insomnia.     Blood pressure 104/73, pulse 105, temperature 96.6 F (35.9 C), temperature source Oral, resp. rate 18, height 5\' 10"  (1.778 m), weight 97.07 kg (214 lb).Body mass index is 30.71 kg/(m^2).  General Appearance: Fairly Groomed  Patent attorney::  Good  Speech:  Pressured  Volume:  Normal  Mood:  Euthymic  Affect:  grandiose, entitled  Thought Process:   Tangential  Orientation:  Full (Time, Place, and Person)  Thought Content:  Delusions  Suicidal Thoughts:  No  Homicidal Thoughts:  No  Memory:  Immediate;   Fair  Judgement:  Impaired  Insight:  Lacking  Psychomotor Activity:  Normal  Concentration:  Fair  Recall:  Fair  Akathisia:  No  Handed:  Right  AIMS (if indicated):     Assets:  Communication Skills Desire for Improvement Housing Physical Health Social Support  Sleep:  Number of Hours: 4.5   Current Medications: Current Facility-Administered Medications  Medication Dose Route Frequency Provider Last Rate Last Dose  . acetaminophen (TYLENOL) tablet 650 mg  650 mg Oral Q6H PRN Cleotis Nipper, MD      . alum & mag hydroxide-simeth (MAALOX/MYLANTA) 200-200-20 MG/5ML suspension 30 mL  30 mL Oral Q4H PRN Cleotis Nipper, MD      . benztropine (COGENTIN) tablet 2 mg  2 mg Oral BID Verne Spurr, PA-C   2 mg at 01/25/13 0732  . divalproex (DEPAKOTE ER) 24 hr tablet 1,500 mg  1,500 mg Oral QHS Verne Spurr, PA-C   1,500 mg at 01/24/13 1640  . ibuprofen (ADVIL,MOTRIN) tablet 600 mg  600 mg Oral Q6H PRN Mike Craze, MD   600 mg at 01/25/13 1424  . LORazepam (ATIVAN) tablet 1 mg  1 mg Oral Q8H PRN Cleotis Nipper, MD      . magnesium hydroxide (MILK OF MAGNESIA) suspension 30 mL  30 mL Oral Daily PRN Cleotis Nipper, MD      . pantoprazole (PROTONIX) EC tablet 80 mg  80 mg Oral Daily Cleotis Nipper, MD   80 mg at 01/25/13 0732  . QUEtiapine (SEROQUEL XR)  24 hr tablet 600 mg  600 mg Oral Daily Verne Spurr, PA-C   600 mg at 01/24/13 2046    Lab Results: No results found for this or any previous visit (from the past 48 hour(s)).  Physical Findings: AIMS: Facial and Oral Movements Muscles of Facial Expression: None, normal Lips and Perioral Area: None, normal Jaw: None, normal Tongue: None, normal,Extremity Movements Upper (arms, wrists, hands, fingers): None, normal Lower (legs, knees, ankles, toes): None, normal, Trunk  Movements Neck, shoulders, hips: None, normal, Overall Severity Severity of abnormal movements (highest score from questions above): None, normal Incapacitation due to abnormal movements: None, normal Patient's awareness of abnormal movements (rate only patient's report): No Awareness, Dental Status Current problems with teeth and/or dentures?: No Does patient usually wear dentures?: No  CIWA:  CIWA-Ar Total: 0 COWS:  COWS Total Score: 1  Treatment Plan Summary: Daily contact with patient to assess and evaluate symptoms and progress in treatment Medication management  Plan: 1. Continue crisis management and stabilization. 2. Medication management to reduce current symptoms to base line and improve patient's overall level of functioning 3. Treat health problems as indicated. 4. Develop treatment plan to decrease risk of relapse upon discharge and the need for readmission. 5. Psycho-social education regarding relapse prevention and self care. 6. Health care follow up as needed for medical problems. 7. Continue home medications where appropriate. 8. Will continue current plan of care no change at this time. 9. ELOS: 3-4 days.   Medical Decision Making Problem Points:  Established problem, stable/improving (1) Data Points:  Review or order medicine tests (1)  I certify that inpatient services furnished can reasonably be expected to improve the patient's condition.  Rona Ravens. Chynah Orihuela RPAC 5:02 PM 01/25/2013

## 2013-01-25 NOTE — Clinical Social Work Note (Signed)
[facility] Group Notes:  (Clinical Social Work)  01/25/2013   11:15-11:45AM  Summary of Progress/Problems:  The main focus of today's process group was to listen to a variety of genres of music and to identify that different types of music provoke different responses.  The patient then was able to identify personally what was soothing for them, as well as energizing.  Handouts were used to record feelings evoked, as well as how patient can personally use this knowledge in sleep habits, with depression, and with other symptoms.  The patient expressed how each type of music made him feel.  He was much more subdued than he has previously been in this group.  Type of Therapy:  Music Therapy with processing done  Participation Level:  Active  Participation Quality:  Attentive  Affect:  Blunted  Cognitive:  Appropriate  Insight:  Developing/Improving  Engagement in Therapy:  Engaged  Modes of Intervention:   Socialization, Support and Processing, Exploration, Education, Rapport Building   [employer], LCSW 01/25/2013, 12:17 PM

## 2013-01-26 MED ORDER — ONDANSETRON 4 MG PO TBDP
8.0000 mg | ORAL_TABLET | Freq: Three times a day (TID) | ORAL | Status: DC | PRN
  Administered 2013-01-26: 8 mg via ORAL
  Filled 2013-01-26: qty 9

## 2013-01-26 NOTE — Progress Notes (Signed)
Adult Psychosocial Assessment Update Interdisciplinary Team  Previous [facility] admissions/discharges:  Admissions Discharges  Date:current Date:  Date:11/28/12 Date:  Date: Date:  Date: Date:  Date: Date:   Changes since the last Psychosocial Assessment (including adherence to outpatient mental health and/or substance abuse treatment, situational issues contributing to decompensation and/or relapse). Albert [name] admitted that he quit taking the meds were were prescribing him and went back to Seroquel again.  It is unclear if he went to a follow up appointment with Dr Betti Cruz or not.  His delusion remains fixed.  He plans to return home and follow up with Dr Betti Cruz at [facility].             Discharge Plan 1. Will you be returning to the same living situation after discharge?   Yes:X No:      If no, what is your plan?           2. Would you like a referral for services when you are discharged? Yes: X    If yes, for what services?  No:       [facility], Gretchen Short       Summary and Recommendations (to be completed by the evaluator) I offered a referral to ACT team to help prevent hospitalization.  He declined.                       Signature:  [name], 01/26/2013 5:20 PM

## 2013-01-26 NOTE — Progress Notes (Signed)
Patient ID: Albert [name], male   DOB: [birth year], 37 y.o.   MRN: [id number] [facility] MD Progress Note  01/26/2013 10:51 AM Donevin Sainsbury  MRN:  [id number] Subjective:  "I am doing pretty good".   Objective: Patient reports decreased agitation and mood swings but has a fixed delusions about plane crashing. He is compliant with his medications, denies suicidal or homicidal ideations,intent or plan. He refused to be followed up by the ACT Team upon discharge. Diagnosis:  Schizophrenia, paranoid type                      Medical non-compliance ADL's:  Intact  Sleep: Good  Appetite:  Good  Suicidal Ideation:  denies Homicidal Ideation:  denies AEB (as evidenced by): Patient's response to direct questioning, reports of improved symptoms including sleep, appetite, and mood and affect.  Psychiatric Specialty Exam: Review of Systems  Constitutional: Negative.  Negative for fever, chills, weight loss, malaise/fatigue and diaphoresis.  HENT: Negative for congestion and sore throat.   Eyes: Negative for blurred vision, double vision and photophobia.  Respiratory: Negative for cough, shortness of breath and wheezing.   Cardiovascular: Negative for chest pain, palpitations and PND.  Gastrointestinal: Negative for heartburn, nausea, vomiting, abdominal pain, diarrhea and constipation.  Musculoskeletal: Negative for myalgias, joint pain and falls.  Neurological: Negative for dizziness, tingling, tremors, sensory change, speech change, focal weakness, seizures, loss of consciousness, weakness and headaches.  Endo/Heme/Allergies: Negative for polydipsia. Does not bruise/bleed easily.  Psychiatric/Behavioral: Negative for depression, suicidal ideas, hallucinations, memory loss and substance abuse. The patient is not nervous/anxious and does not have insomnia.     Blood pressure 115/79, pulse 114, temperature 97.3 F (36.3 C), temperature source Oral, resp. rate 20, height 5\' 10"  (1.778 m),  weight 97.07 kg (214 lb).Body mass index is 30.71 kg/(m^2).  General Appearance: Fairly Groomed  Patent attorney::  Good  Speech:  Pressured  Volume:  Normal  Mood:  Euthymic  Affect:  grandiose, entitled  Thought Process:  Tangential  Orientation:  Full (Time, Place, and Person)  Thought Content:  Delusions  Suicidal Thoughts:  No  Homicidal Thoughts:  No  Memory:  Immediate;   Fair  Judgement:  marginal  Insight:  marginal  Psychomotor Activity:  Normal  Concentration:  Fair  Recall:  Fair  Akathisia:  No  Handed:  Right  AIMS (if indicated):     Assets:  Communication Skills Desire for Improvement Housing Physical Health Social Support  Sleep:  Number of Hours: 5.25   Current Medications: Current Facility-Administered Medications  Medication Dose Route Frequency Provider Last Rate Last Dose  . acetaminophen (TYLENOL) tablet 650 mg  650 mg Oral Q6H PRN Cleotis Nipper, MD      . alum & mag hydroxide-simeth (MAALOX/MYLANTA) 200-200-20 MG/5ML suspension 30 mL  30 mL Oral Q4H PRN Cleotis Nipper, MD      . benztropine (COGENTIN) tablet 2 mg  2 mg Oral BID Verne Spurr, PA-C   2 mg at 01/26/13 0748  . divalproex (DEPAKOTE ER) 24 hr tablet 1,500 mg  1,500 mg Oral QHS Verne Spurr, PA-C   1,500 mg at 01/25/13 1847  . ibuprofen (ADVIL,MOTRIN) tablet 600 mg  600 mg Oral Q6H PRN Mike Craze, MD   600 mg at 01/26/13 0748  . LORazepam (ATIVAN) tablet 1 mg  1 mg Oral Q8H PRN Cleotis Nipper, MD      . magnesium hydroxide (MILK OF MAGNESIA) suspension 30 mL  30  mL Oral Daily PRN Cleotis Nipper, MD      . pantoprazole (PROTONIX) EC tablet 80 mg  80 mg Oral Daily Cleotis Nipper, MD   80 mg at 01/26/13 0748  . QUEtiapine (SEROQUEL XR) 24 hr tablet 600 mg  600 mg Oral Daily Verne Spurr, PA-C   600 mg at 01/25/13 2126    Lab Results: No results found for this or any previous visit (from the past 48 hour(s)).  Physical Findings: AIMS: Facial and Oral Movements Muscles of Facial Expression:  None, normal Lips and Perioral Area: None, normal Jaw: None, normal Tongue: None, normal,Extremity Movements Upper (arms, wrists, hands, fingers): None, normal Lower (legs, knees, ankles, toes): None, normal, Trunk Movements Neck, shoulders, hips: None, normal, Overall Severity Severity of abnormal movements (highest score from questions above): None, normal Incapacitation due to abnormal movements: None, normal Patient's awareness of abnormal movements (rate only patient's report): No Awareness, Dental Status Current problems with teeth and/or dentures?: No Does patient usually wear dentures?: No  CIWA:  CIWA-Ar Total: 0 COWS:  COWS Total Score: 1  Treatment Plan Summary: Daily contact with patient to assess and evaluate symptoms and progress in treatment Medication management  Plan: 1. Continue crisis management and stabilization. 2. Medication management to reduce current symptoms to base line and improve patient's overall level of functioning 3. Treat health problems as indicated. 4. Develop treatment plan to decrease risk of relapse upon discharge and the need for readmission. 5. Psycho-social education regarding relapse prevention and self care. 6. Health care follow up as needed for medical problems. 7. Continue home medications where appropriate. 8. Will continue current plan of care no change at this time. 9. Valproic acid level on 01/27/13 10 ELOS 1-2 days.   Medical Decision Making Problem Points:  Established problem, stable/improving (1) Data Points:  Review or order medicine tests (1)  I certify that inpatient services furnished can reasonably be expected to improve the patient's condition.  Christabel Camire,MD 10:51 AM 01/26/2013

## 2013-01-26 NOTE — Progress Notes (Signed)
[facility] LCSW Aftercare Discharge Planning Group Note  01/26/2013 9:24 AM  Participation Quality:  Appropriate  Affect:  Appropriate  Cognitive:  Delusional  Insight:  Limited  Engagement in Group:  Limited  Modes of Intervention:  Discussion, Exploration and Socialization  Summary of Progress/Problems: Offered him referral to ACT team.  He declined.   Albert [name] B 01/26/2013, 9:24 AM

## 2013-01-26 NOTE — Progress Notes (Signed)
D: Pt denies SI/HI. Pt denies AVH. Pt continues to be paranoid about planes crashing. Pt continues to present with delusional thoughts. Pt is compliant with treatment.   A:  Medications administered as ordered per MD. Verbal support given. Pt encouraged to attend groups. 15 minute checks performed for safety.   R: Pt is receptive to treatment. Pt manipulates aftercare treatment between staff.

## 2013-01-26 NOTE — Clinical Social Work Note (Signed)
  Type of Therapy: Process Group Therapy  Participation Level:  Active  Participation Quality:  Attentive  Affect:  Appropriate  Cognitive:  Oriented  Insight:  Improving  Engagement in Group:  Improving  Engagement in Therapy:  Improving  Modes of Intervention:  Activity, Clarification, Education, Problem-solving and Support  Summary of Progress/Problems: Today's group addressed the issue of overcoming obstacles.  Patients were asked to identify their biggest obstacle post d/c that stands in the way of their on-going success, and then problem solve as to how to manage this. [name] states his biggest obstacle is loneliness, and his plan is to get out more to coffee shops, where he enjoys the adventure, "because you never know who or what you might encounter."  Gave others good feedback.  He cited the help of some mentors, and talked about crying as being a good release of emotion.       [name] 01/26/2013   2:49 PM

## 2013-01-26 NOTE — Progress Notes (Signed)
Patient ID: Albert [name], male   DOB: 1975/12/20, 37 y.o.   MRN: [id number] D. The patient is pleasant and social and is interacting appropriately in the milieu. Had difficulty answering questions appropriately or staying on topic during group. His thoughts are loose with flight of idea. A. Encouraged the patient to attend evening group. Administered medications. R. Attended group. Compliant with medication.

## 2013-01-26 NOTE — Progress Notes (Signed)
D: Pt in bed resting with eyes closed. Respirations even and unlabored. Pt appears to be in no signs of distress at this time. A: Q15min checks remains for this pt. R: Pt remains safe at this time.   

## 2013-01-26 NOTE — Progress Notes (Signed)
Patient ID: Albert [name], male   DOB: 11/01/1975, 37 y.o.   MRN: [id number]  D: Pt denies SI/HI/AVH. Pt is pleasant and cooperative. Pt started to vomit in the dayroom, then was moved to his room and continued to vomit.    A: Pt was offered support and encouragement. Pt was given scheduled medications. Pt was encourage to attend groups. Q 15 minute checks were done for safety. PA informed and he prescribed Zofran for N/V . Pt was put on contact precautions.    R:Pt attends groups and interacts well with peers and staff. Pt is taking medication. Pt has no complaints.Pt receptive to treatment and safety maintained on unit.

## 2013-01-27 MED ORDER — PANTOPRAZOLE SODIUM 40 MG PO TBEC: 80.0000 mg | DELAYED_RELEASE_TABLET | Freq: Every day | ORAL | Status: DC

## 2013-01-27 MED ORDER — DIVALPROEX SODIUM ER 500 MG PO TB24: 1500.0000 mg | ORAL_TABLET | Freq: Every day | ORAL | Status: DC

## 2013-01-27 MED ORDER — ONDANSETRON 8 MG PO TBDP: 8.0000 mg | ORAL_TABLET | Freq: Three times a day (TID) | ORAL | Status: DC | PRN

## 2013-01-27 MED ORDER — BENZTROPINE MESYLATE 2 MG PO TABS: 2.0000 mg | ORAL_TABLET | Freq: Two times a day (BID) | ORAL | Status: DC

## 2013-01-27 MED ORDER — QUETIAPINE FUMARATE ER 300 MG PO TB24: 600.0000 mg | ORAL_TABLET | Freq: Every day | ORAL | Status: DC

## 2013-01-27 NOTE — [facility] Suicide Risk Assessment (Signed)
[facility] INPATIENT:  Family/Significant Other Suicide Prevention Education  Suicide Prevention Education:  Education Completed; No one  has been identified by the patient as the family member/significant other with whom the patient will be residing, and identified as the person(s) who will aid the patient in the event of a mental health crisis (suicidal ideations/suicide attempt).  With written consent from the patient, the family member/significant other has been provided the following suicide prevention education, prior to the and/or following the discharge of the patient.  The suicide prevention education provided includes the following:  Suicide risk factors  Suicide prevention and interventions  National Suicide Hotline telephone number  [facility] assessment telephone number  [facility] Emergency Assistance 911  [facility] and/or Residential Mobile Crisis Unit telephone number  Request made of family/significant other to:  Remove weapons (e.g., guns, rifles, knives), all items previously/currently identified as safety concern.    Remove drugs/medications (over-the-counter, prescriptions, illicit drugs), all items previously/currently identified as a safety concern.  The family member/significant other verbalizes understanding of the suicide prevention education information provided.  The family member/significant other agrees to remove the items of safety concern listed above.   Despite tx plan to the contrary, [name] was not suicidal at admission, nor did he endorse SI during his stay here.  No SPE needed.  [name] 01/27/2013, 5:12 PM

## 2013-01-27 NOTE — Progress Notes (Signed)
Pt denies SI/HI/AVH. Pt denies having nausea, vomiting, or diarrhea this morning. Pt presents with appropriate behaviors. Pt remains in his room on contact precautions. Pt remains safe this time.

## 2013-01-27 NOTE — Progress Notes (Signed)
Patient ID: Albert [name], male   DOB: Jul 11, 1976, 36 y.o.   MRN: [id number] Patient discharged home per MD order.  Patient received all this belongings, medication samples and discharge instructions.  Patient denies any SI/HI/AVH.  His main concern is being able to go back to [employer] since this is where he was picked up by police.  He denies any SI/HI/AVH.  Patient left ambulatory and called a cab for transportation from the lobby.

## 2013-01-27 NOTE — [facility] Suicide Risk Assessment (Signed)
Suicide Risk Assessment  Discharge Assessment     Demographic Factors:  Male, Caucasian, Low socioeconomic status, Living alone and Unemployed  Mental Status Per Nursing Assessment::   On Admission:     Current Mental Status by Physician: patient denies suicidal ideation,intent or plan.  Loss Factors: Decrease in vocational status and Financial problems/change in socioeconomic status  Historical Factors: Impulsivity  Risk Reduction Factors:   Positive social support and Positive therapeutic relationship  Continued Clinical Symptoms:  Resolving delusions and psychosis.  Cognitive Features That Contribute To Risk:  Closed-mindedness Polarized thinking    Suicide Risk:  Minimal: No identifiable suicidal ideation.  Patients presenting with no risk factors but with morbid ruminations; may be classified as minimal risk based on the severity of the depressive symptoms  Discharge Diagnoses:   AXIS I:  Paranoid Schizophrenia  AXIS II:  Deferred AXIS III:   Past Medical History  Diagnosis Date  . Drug abuse     xanax addiction 5 years ago  . Seizures    AXIS IV:  other psychosocial or environmental problems and problems related to social environment AXIS V:  61-70 mild symptoms  Plan Of Care/Follow-up recommendations:  Activity:  as tolerated Diet:  healthy Tests:  Valproic acid level. Current depakote level is 89.5 Other:  patient ot keep his after care appointment  Is patient on multiple antipsychotic therapies at discharge:  No   Has Patient had three or more failed trials of antipsychotic monotherapy by history:  No  Recommended Plan for Multiple Antipsychotic Therapies: N/A  Erby Sanderson,MD 01/27/2013, 10:02 AM

## 2013-01-27 NOTE — Progress Notes (Signed)
[facility] Adult Case Management Discharge Plan :  Will you be returning to the same living situation after discharge: Yes,  home At discharge, do you have transportation home?:Yes,  says he will call a cab Do you have the ability to pay for your medications:Yes,  MCD/mental health  Release of information consent forms completed and in the chart;  Patient's signature needed at discharge.  Patient to Follow up at: Follow-up Information   Follow up with [facility] On 01/29/2013. (12:40PM)    Contact information:   [address] [employer]  [phone]      Follow up with Gretchen Short On 02/02/2013. (1:00PM)    Contact information:   [address]  [place] [phone]      Patient denies SI/HI:   Yes,  yes    Aeronautical engineer and Suicide Prevention discussed:  Yes,  yes  [name] 01/27/2013, 5:17 PM

## 2013-01-27 NOTE — Progress Notes (Signed)
D: MHT report that pt continues to vomit. Writer obtained an order for Zofran 8 mg ODT. A: Writer administered Zofran and spent 1:1 time talking to pt. Continued support and availability as needed was extended to the pt. Staff monitor with q79min checks. R: Zofran decreased N&V for this pt. Pt remains safe at this time.

## 2013-01-27 NOTE — Discharge Summary (Signed)
Physician Discharge Summary Note  Patient:  Albert [name] is an 37 y.o., male MRN:  [id number] DOB:  29-Sep-1976 Patient phone:  [phone] (home)  Patient address:   [address] [place] [address],   Date of Admission:  01/22/2013 Date of Discharge: 01/27/2013  Reason for Admission:  Psychosis with mania  Discharge Diagnoses: Active Problems:   Schizophrenia, paranoid type   Paranoid schizophrenia   Medical non-compliance Discharge Diagnoses:  AXIS I: Paranoid Schizophrenia  AXIS II: Deferred  AXIS III:  Past Medical History   Diagnosis  Date   .  Drug abuse      xanax addiction 5 years ago   .  Seizures    AXIS IV: other psychosocial or environmental problems and problems related to social environment  AXIS V: 61-70 mild symptoms     Review of Systems  Constitutional: Negative.  Negative for fever, chills, weight loss, malaise/fatigue and diaphoresis.  HENT: Negative for congestion and sore throat.   Eyes: Negative for blurred vision, double vision and photophobia.  Respiratory: Negative for cough, shortness of breath and wheezing.   Cardiovascular: Negative for chest pain, palpitations and PND.  Gastrointestinal: Positive for nausea and vomiting. Negative for heartburn, abdominal pain, diarrhea and constipation.  Musculoskeletal: Negative for myalgias, joint pain and falls.  Neurological: Negative for dizziness, tingling, tremors, sensory change, speech change, focal weakness, seizures, loss of consciousness, weakness and headaches.  Endo/Heme/Allergies: Negative for polydipsia. Does not bruise/bleed easily.  Psychiatric/Behavioral: Negative for depression, suicidal ideas, hallucinations, memory loss and substance abuse. The patient is not nervous/anxious and does not have insomnia.     Level of Care:  OP  [name] Course:  Albert [name] was admitted after arriving at the [facility] [name] via ambulance called by his psychiatrists' office.  He had arrived without an  appointment in a floridly manic state. He was agitated and speaking in a tangential and pressured manner. The MD was not in the office at the time and the office staff became concerned when Albert [name] continued to vent his concerns over the missing Albert [name] plane currently in the news. He was delusional and paranoid stating that all the over head planes were protecting him and speaking to him via his sensors in his body.     He was taken to Albert [name] where he was given medical clearance and transferred to Albert [name] for further stabilization.  Upon evaluation at Albert [name] revealed that he had been non-compliant with his medication since the day of his last discharge from Albert [name].  He immediately returned to using the Seroquel and Depakote he said he had left over at home and that Dr. Betti Cruz his psychiatrist was OK with this.     Less was restarted on the Seroquel 300XR and titrated to 600mg  at hs. Depakote was added and his dose was increased to 1500mg  at hs.  His symptoms of pressured speech, decreased need for sleep, grandiose and entitled behaviors decreased significantly after the first two days, upon his getting restful sleep.  He continued to be paranoid and delusional regarding the missing Albert [name] flight but stated that he did not feel that the air planes were in constant contact with him now.     Albert [name] attended and participated in unit programming.  He was cooperative with the staff and got along well with other patients on the unit. He was not a behavior problem and did not need 1:1 supervision.     He responded well to medication and requested discharge on the 6th day.  He was in much improved condition than upon arrival.  He was felt to be in stable enough condition to discharged home. The night prior to discharge [name] became nauseated and began vomiting while in the day room. He was treated with zofran 8mg  for nausea and placed on contact precautions.  On the day of discharge he denied  nausea vomiting or diarrhea and was stable to be discharged home.    Consults:  None  Significant Diagnostic Studies:  labs: CMP, CBC/Diff, UA, UDS  Discharge Vitals:   Blood pressure 117/79, pulse 98, temperature 97 F (36.1 C), temperature source Oral, resp. rate 16, height 5\' 10"  (1.778 m), weight 97.07 kg (214 lb). Body mass index is 30.71 kg/(m^2). Lab Results:   No results found for this or any previous visit (from the past 72 hour(s)).  Physical Findings: AIMS: Facial and Oral Movements Muscles of Facial Expression: None, normal Lips and Perioral Area: None, normal Jaw: None, normal Tongue: None, normal,Extremity Movements Upper (arms, wrists, hands, fingers): None, normal Lower (legs, knees, ankles, toes): None, normal, Trunk Movements Neck, shoulders, hips: None, normal, Overall Severity Severity of abnormal movements (highest score from questions above): None, normal Incapacitation due to abnormal movements: None, normal Patient's awareness of abnormal movements (rate only patient's report): No Awareness, Dental Status Current problems with teeth and/or dentures?: No Does patient usually wear dentures?: No  CIWA:  CIWA-Ar Total: 0 COWS:  COWS Total Score: 1  Psychiatric Specialty Exam: See Psychiatric Specialty Exam and Suicide Risk Assessment completed by Attending Physician prior to discharge.  Discharge destination:  Home  Is patient on multiple antipsychotic therapies at discharge:  No   Has Patient had three or more failed trials of antipsychotic monotherapy by history:  No  Recommended Plan for Multiple Antipsychotic Therapies: Not applicable  Discharge Orders   Future Orders Complete By Expires     Diet - low sodium heart healthy  As directed     Discharge instructions  As directed     Comments:      Take all of your medications as directed. Be sure to keep all of your follow up appointments.  If you are unable to keep your follow up appointment, call  your Doctor's office to let them know, and reschedule.  Make sure that you have enough medication to last until your appointment. Be sure to get plenty of rest. Going to bed at the same time each night will help. Try to avoid sleeping during the day.  Increase your activity as tolerated. Regular exercise will help you to sleep better and improve your mental health. Eating a heart healthy diet is recommended. Try to avoid salty or fried foods. Be sure to avoid all alcohol and illegal drugs.    Increase activity slowly  As directed         Medication List    STOP taking these medications       ibuprofen 200 MG tablet  Commonly known as:  ADVIL,MOTRIN     LORazepam 1 MG tablet  Commonly known as:  ATIVAN     mirtazapine 15 MG tablet  Commonly known as:  REMERON     omeprazole 40 MG capsule  Commonly known as:  PRILOSEC      TAKE these medications     Indication   benztropine 2 MG tablet  Commonly known as:  COGENTIN  Take 1 tablet (2 mg total) by mouth 2 (two) times daily.   Indication:  Extrapyramidal Reaction caused by  Medications     divalproex 500 MG 24 hr tablet  Commonly known as:  DEPAKOTE ER  Take 3 tablets (1,500 mg total) by mouth at bedtime. For mood stability.   Indication:  Manic Phase of Manic-Depression     ondansetron 8 MG disintegrating tablet  Commonly known as:  ZOFRAN-ODT  Take 1 tablet (8 mg total) by mouth every 8 (eight) hours as needed. For nausea and vomiting   Indication:  nausea and vomiting     pantoprazole 40 MG tablet  Commonly known as:  PROTONIX  Take 2 tablets (80 mg total) by mouth daily. For acid reflux   Indication:  Conditions of Excess Stomach Acid Secretion     QUEtiapine 300 MG 24 hr tablet  Commonly known as:  SEROQUEL XR  Take 2 tablets (600 mg total) by mouth daily. For bipolar mania.   Indication:  Manic-Depression           Follow-up Information   Follow up with [facility] On 01/29/2013. (12:40PM)    Contact  information:   [address] [employer]  [phone]      Follow up with Gretchen Short On 02/02/2013. (1:00PM)    Contact information:   [address]  [place] [phone]      Follow-up recommendations:   Activities: Resume activity as tolerated. Diet: Heart healthy low sodium diet Tests: Follow up testing will be determined by your out patient provider. Comments:  If you should become nauseated or begin vomiting again, you have been provided with Zofran for your symptoms. Sips of clear liquids such as Ginger Ale, Gatorade or mild tea can also help with nausea.  If your symptoms increase or persist please go to the nearest Emergency Department.  Total Discharge Time:  Greater than 30 minutes.  Signed: Rona Ravens. Marquita Lias RPAC 2:54 PM 01/27/2013

## 2013-01-28 NOTE — Discharge Summary (Signed)
Seen and agreed. Quinto Tippy, MD 

## 2013-01-30 NOTE — Progress Notes (Signed)
Patient Discharge Instructions:  After Visit Summary (AVS):   Faxed to:  01/30/13 Discharge Summary Note:   Faxed to:  01/30/13 Psychiatric Admission Assessment Note:   Faxed to:  01/30/13 Suicide Risk Assessment - Discharge Assessment:   Faxed to:  01/30/13 Faxed/Sent to the Next Level Care provider:  01/30/13 Faxed to [facility] @ [phone] Records sent via mail to: [name] [address], [place] [address]  Jerelene Redden, 01/30/2013, 1:51 PM

## 2017-01-02 DIAGNOSIS — Z79899 Other long term (current) drug therapy: Secondary | ICD-10-CM | POA: Insufficient documentation

## 2017-01-02 DIAGNOSIS — F200 Paranoid schizophrenia: Secondary | ICD-10-CM | POA: Diagnosis not present

## 2017-01-02 DIAGNOSIS — R443 Hallucinations, unspecified: Secondary | ICD-10-CM | POA: Diagnosis present

## 2017-01-02 DIAGNOSIS — Z87891 Personal history of nicotine dependence: Secondary | ICD-10-CM | POA: Diagnosis not present

## 2017-01-03 ENCOUNTER — Inpatient Hospital Stay (HOSPITAL_COMMUNITY)
Admission: AD | Admit: 2017-01-03 | Discharge: 2017-01-09 | DRG: 885 | Disposition: A | Discharge status: Home / Self Care | Payer: Medicare Other | Source: Intra-hospital | Attending: Psychiatry | Admitting: Psychiatry

## 2017-01-03 ENCOUNTER — Encounter (HOSPITAL_COMMUNITY): Payer: Self-pay | Admitting: Emergency Medicine

## 2017-01-03 ENCOUNTER — Encounter (HOSPITAL_COMMUNITY): Payer: Self-pay

## 2017-01-03 ENCOUNTER — Emergency Department (HOSPITAL_COMMUNITY)
Admission: EM | Admit: 2017-01-03 | Discharge: 2017-01-03 | Disposition: A | Discharge status: Other Acute Inpatient Hospital | Payer: Medicare Other | Attending: Emergency Medicine | Admitting: Emergency Medicine

## 2017-01-03 DIAGNOSIS — G4700 Insomnia, unspecified: Secondary | ICD-10-CM | POA: Diagnosis present

## 2017-01-03 DIAGNOSIS — F250 Schizoaffective disorder, bipolar type: Secondary | ICD-10-CM | POA: Diagnosis present

## 2017-01-03 DIAGNOSIS — F1021 Alcohol dependence, in remission: Secondary | ICD-10-CM

## 2017-01-03 DIAGNOSIS — Z87891 Personal history of nicotine dependence: Secondary | ICD-10-CM

## 2017-01-03 DIAGNOSIS — Z818 Family history of other mental and behavioral disorders: Secondary | ICD-10-CM | POA: Diagnosis not present

## 2017-01-03 DIAGNOSIS — R000 Tachycardia, unspecified: Secondary | ICD-10-CM | POA: Diagnosis present

## 2017-01-03 DIAGNOSIS — F1221 Cannabis dependence, in remission: Secondary | ICD-10-CM | POA: Diagnosis present

## 2017-01-03 DIAGNOSIS — Z91410 Personal history of adult physical and sexual abuse: Secondary | ICD-10-CM

## 2017-01-03 DIAGNOSIS — F200 Paranoid schizophrenia: Secondary | ICD-10-CM | POA: Diagnosis not present

## 2017-01-03 DIAGNOSIS — F411 Generalized anxiety disorder: Secondary | ICD-10-CM | POA: Diagnosis present

## 2017-01-03 DIAGNOSIS — Z9119 Patient's noncompliance with other medical treatment and regimen: Secondary | ICD-10-CM | POA: Diagnosis not present

## 2017-01-03 DIAGNOSIS — R440 Auditory hallucinations: Secondary | ICD-10-CM

## 2017-01-03 DIAGNOSIS — R7989 Other specified abnormal findings of blood chemistry: Secondary | ICD-10-CM | POA: Clinically undetermined

## 2017-01-03 DIAGNOSIS — Z79899 Other long term (current) drug therapy: Secondary | ICD-10-CM | POA: Diagnosis not present

## 2017-01-03 DIAGNOSIS — F1910 Other psychoactive substance abuse, uncomplicated: Secondary | ICD-10-CM | POA: Diagnosis not present

## 2017-01-03 LAB — CBC
HCT: 45 % (ref 39.0–52.0)
Hemoglobin: 16 g/dL (ref 13.0–17.0)
MCH: 33 pg (ref 26.0–34.0)
MCHC: 35.6 g/dL (ref 30.0–36.0)
MCV: 92.8 fL (ref 78.0–100.0)
PLATELETS: 251 10*3/uL (ref 150–400)
RBC: 4.85 MIL/uL (ref 4.22–5.81)
RDW: 12.7 % (ref 11.5–15.5)
WBC: 8 10*3/uL (ref 4.0–10.5)

## 2017-01-03 LAB — RAPID URINE DRUG SCREEN, HOSP PERFORMED
AMPHETAMINES: NOT DETECTED
Barbiturates: NOT DETECTED
Benzodiazepines: NOT DETECTED
Cocaine: NOT DETECTED
OPIATES: NOT DETECTED
TETRAHYDROCANNABINOL: NOT DETECTED

## 2017-01-03 LAB — COMPREHENSIVE METABOLIC PANEL
ALK PHOS: 53 U/L (ref 38–126)
ALT: 19 U/L (ref 17–63)
AST: 23 U/L (ref 15–41)
Albumin: 4.2 g/dL (ref 3.5–5.0)
Anion gap: 9 (ref 5–15)
BILIRUBIN TOTAL: 0.8 mg/dL (ref 0.3–1.2)
BUN: 9 mg/dL (ref 6–20)
CHLORIDE: 107 mmol/L (ref 101–111)
CO2: 21 mmol/L — ABNORMAL LOW (ref 22–32)
CREATININE: 0.88 mg/dL (ref 0.61–1.24)
Calcium: 9.3 mg/dL (ref 8.9–10.3)
Glucose, Bld: 111 mg/dL — ABNORMAL HIGH (ref 65–99)
Potassium: 4 mmol/L (ref 3.5–5.1)
Sodium: 137 mmol/L (ref 135–145)
Total Protein: 7.6 g/dL (ref 6.5–8.1)

## 2017-01-03 LAB — VALPROIC ACID LEVEL: Valproic Acid Lvl: 101 ug/mL — ABNORMAL HIGH (ref 50.0–100.0)

## 2017-01-03 LAB — ETHANOL

## 2017-01-03 LAB — ACETAMINOPHEN LEVEL: Acetaminophen (Tylenol), Serum: <10 ug/mL — ABNORMAL LOW (ref 10–30)

## 2017-01-03 LAB — SALICYLATE LEVEL

## 2017-01-03 MED ORDER — ONDANSETRON 8 MG PO TBDP: 8.0000 mg | ORAL_TABLET | Freq: Three times a day (TID) | ORAL | Status: DC | PRN

## 2017-01-03 MED ORDER — BENZTROPINE MESYLATE 2 MG PO TABS
2.0000 mg | ORAL_TABLET | Freq: Two times a day (BID) | ORAL | Status: DC
  Administered 2017-01-03 – 2017-01-04 (×2): 2 mg via ORAL
  Filled 2017-01-03 (×3): qty 1
  Filled 2017-01-03: qty 2
  Filled 2017-01-03 (×2): qty 1
  Filled 2017-01-03: qty 2
  Filled 2017-01-03: qty 1

## 2017-01-03 MED ORDER — PANTOPRAZOLE SODIUM 40 MG PO TBEC
80.0000 mg | DELAYED_RELEASE_TABLET | Freq: Every day | ORAL | Status: DC
  Administered 2017-01-04 – 2017-01-06 (×3): 80 mg via ORAL
  Filled 2017-01-03 (×6): qty 2

## 2017-01-03 MED ORDER — ONDANSETRON HCL 4 MG PO TABS: 4.0000 mg | ORAL_TABLET | Freq: Three times a day (TID) | ORAL | Status: DC | PRN

## 2017-01-03 MED ORDER — QUETIAPINE FUMARATE ER 300 MG PO TB24: 600.0000 mg | ORAL_TABLET | Freq: Every day | ORAL | Status: DC

## 2017-01-03 MED ORDER — ALUM & MAG HYDROXIDE-SIMETH 200-200-20 MG/5ML PO SUSP: 30.0000 mL | ORAL | Status: DC | PRN

## 2017-01-03 MED ORDER — ONDANSETRON 4 MG PO TBDP
8.0000 mg | ORAL_TABLET | Freq: Three times a day (TID) | ORAL | Status: DC | PRN
  Administered 2017-01-05 – 2017-01-06 (×2): 8 mg via ORAL
  Filled 2017-01-03 (×3): qty 2

## 2017-01-03 MED ORDER — QUETIAPINE FUMARATE ER 300 MG PO TB24
600.0000 mg | ORAL_TABLET | Freq: Every day | ORAL | Status: DC
  Administered 2017-01-03 – 2017-01-04 (×2): 600 mg via ORAL
  Filled 2017-01-03 (×4): qty 2

## 2017-01-03 MED ORDER — MAGNESIUM HYDROXIDE 400 MG/5ML PO SUSP: 30.0000 mL | Freq: Every day | ORAL | Status: DC | PRN

## 2017-01-03 MED ORDER — IBUPROFEN 200 MG PO TABS: 600.0000 mg | ORAL_TABLET | Freq: Three times a day (TID) | ORAL | Status: DC | PRN

## 2017-01-03 MED ORDER — DIVALPROEX SODIUM ER 500 MG PO TB24
1500.0000 mg | ORAL_TABLET | Freq: Every day | ORAL | Status: DC
  Administered 2017-01-03: 1500 mg via ORAL
  Filled 2017-01-03: qty 3

## 2017-01-03 MED ORDER — ASENAPINE MALEATE 5 MG SL SUBL: 10.0000 mg | SUBLINGUAL_TABLET | Freq: Two times a day (BID) | SUBLINGUAL | Status: DC | PRN

## 2017-01-03 MED ORDER — QUETIAPINE FUMARATE ER 300 MG PO TB24
600.0000 mg | ORAL_TABLET | Freq: Every day | ORAL | Status: DC
  Administered 2017-01-03: 600 mg via ORAL
  Filled 2017-01-03: qty 2

## 2017-01-03 MED ORDER — ACETAMINOPHEN 325 MG PO TABS: 650.0000 mg | ORAL_TABLET | ORAL | Status: DC | PRN

## 2017-01-03 MED ORDER — PANTOPRAZOLE SODIUM 40 MG PO TBEC
80.0000 mg | DELAYED_RELEASE_TABLET | Freq: Every day | ORAL | Status: DC
  Administered 2017-01-03: 80 mg via ORAL
  Filled 2017-01-03: qty 2

## 2017-01-03 MED ORDER — BENZTROPINE MESYLATE 1 MG PO TABS
2.0000 mg | ORAL_TABLET | Freq: Two times a day (BID) | ORAL | Status: DC
  Administered 2017-01-03 (×2): 2 mg via ORAL
  Filled 2017-01-03 (×2): qty 2

## 2017-01-03 MED ORDER — ASENAPINE MALEATE 5 MG SL SUBL
10.0000 mg | SUBLINGUAL_TABLET | Freq: Two times a day (BID) | SUBLINGUAL | Status: DC | PRN
  Administered 2017-01-03: 10 mg via SUBLINGUAL
  Filled 2017-01-03: qty 2

## 2017-01-03 MED ORDER — ZOLPIDEM TARTRATE 10 MG PO TABS: 10.0000 mg | ORAL_TABLET | Freq: Every evening | ORAL | Status: DC | PRN

## 2017-01-03 MED ORDER — DIVALPROEX SODIUM ER 500 MG PO TB24
1500.0000 mg | ORAL_TABLET | Freq: Every day | ORAL | Status: DC
  Administered 2017-01-03: 1500 mg via ORAL
  Filled 2017-01-03 (×3): qty 3

## 2017-01-03 MED ORDER — IBUPROFEN 600 MG PO TABS: 600.0000 mg | ORAL_TABLET | Freq: Three times a day (TID) | ORAL | Status: DC | PRN

## 2017-01-03 NOTE — ED Notes (Signed)
Pt discharged ambulatory with [place] driver.  All belongings were sent with patient. 

## 2017-01-03 NOTE — BH Assessment (Signed)
[facility] Assessment Progress Note  Per Thedore Mins, MD, this pt requires psychiatric hospitalization at this time.  Malva Limes, RN, [facility] pt to [facility] Rm 504-1; they will be ready to receive pt at 15:30.  Brandi Levette, TS, agrees to review consent forms with pt.  Pt's nurse, Kendal Hymen, has been notified, and agrees to send original paperwork along with pt via Juel Burrow, and to call report to [phone].  Doylene Canning, MA Triage Specialist [phone]

## 2017-01-03 NOTE — Progress Notes (Signed)
01/03/17 1348:  LRT introduced self to pt and offered activities.  Pt asked to play [employer].  LRT and pt played in the dayroom.  Pt was calm and able to focus.  Pt expressed that he kept thinking things were going to happen to him.  Pt was scared he would put in a straight jacket if he became to anxious [facility].  LRT reassured him, he would not be put in a straight jacket and everything would be fine.  Pt seemed to relax a little more after being reassured he was safe.  Caroll RancherMarjette Tashanti Dalporto, LRT/CTRS

## 2017-01-03 NOTE — ED Notes (Signed)
Renata Brophy(Friend): [phone]

## 2017-01-03 NOTE — BH Assessment (Signed)
Tele Assessment Note   Albert [name] is an 41 y.o. male who presents to the ED voluntarily due to experiencing hallucinations. Pt presented disoriented and expressed a tangential thought process during the assessment. Pt reported feeling as if he is being controlled by someone named "Albert [name]." Pt reports he feels that "Albert [name]" is "taking over his body". Pt expressed dfficulity concentrating and assessor attempted to redirect the pt several times throughout the assessment. Pt was asked direct questions during the assessment and he would often respond with irrelevant answers.   Pt stated "the last few days my emotions have been up and down. There is a guy in my head and he won't leave me alone. He's a Black guy, but he's me. I saw an image and a code. The code is key stroke control. I think I'm his helper but I don't want to be punished. He has kids and we were at the court house and he said what are you doing. He has a word written in his eye it says resentment but I don't know what that means. Albert [name] just gets into my head and tells me I'm no good and I'm a worthless, pathetic guy. I saw an image of him in some ski lifts."   Pt reports he changed his medication dosage on his own 2 months ago and that is when he began seeing "Albert [name]. We were at a peace conference and he takes his waist clip and it says loser. I go to my AA meetings but I got scared because something was going on and my family must've known because they all called me today within an hour of each other" Pt states that Albert [name] is someone he met a long time ago and stated "when I first met him I kept calling him daddy and I had sensors going off in my chest. One night I was masturbating and Albert [name] told me to stop so I got on my computer and found other things to do."   Pt continued to make sexual references during the assessment and stated he did not want to "lose his sexuality." Pt reports that he felt the police could read his mind. Pt  expressed paranoia as he reported that he thought "Albert [name]" was trying to burn down a condo near [place].  Per Albert Clan, PA pt meets inpt criteria. TTS to seek placement.   Diagnosis: Other Specified Dissociative D/O;  hx of Schizophrenia  Past Medical History:  Past Medical History:  Diagnosis Date   Alcoholism ([place])    Drug abuse    xanax addiction 5 years ago   Medical non-compliance    Paranoid schizophrenia ([place])    Seizures ([place])     History reviewed. No pertinent surgical history.  Family History: No family history on file.  Social History:  reports that he has quit smoking. He has never used smokeless tobacco. He reports that he does not drink alcohol or use drugs.  Additional Social History:  Alcohol / Drug Use Pain Medications: See PTA meds  Prescriptions: See PTA meds  Over the Counter: See PTA meds  History of alcohol / drug use?: Yes Longest period of sobriety (when/how long): 11 months  Substance #1 Name of Substance 1: Marijuana  1 - Age of First Use: unknown 1 - Amount (size/oz): unknown 1 - Frequency: unknown 1 - Duration: unknown 1 - Last Use / Amount: 11 months ago  CIWA: CIWA-Ar BP: 133/96 Pulse Rate: 108 COWS:    PATIENT STRENGTHS: (choose  at least two) Occupational psychologist fund of knowledge Motivation for treatment/growth  Allergies: No Known Allergies  Home Medications:  (Not in a hospital admission)  OB/GYN Status:  No LMP for male patient.  General Assessment Data Location of Assessment: [facility] ED TTS Assessment: In system Is this a Tele or Face-to-Face Assessment?: Tele Assessment Is this an Initial Assessment or a Re-assessment for this encounter?: Initial Assessment Marital status: Single Is patient pregnant?: No Pregnancy Status: No Living Arrangements: Other (Comment) (pt vague, stated lives in house with others ) Can pt return to current living arrangement?: Yes Admission Status:  Voluntary Is patient capable of signing voluntary admission?: Yes Referral Source: Self/Family/Friend Insurance type: Medicare      Crisis Care Plan Living Arrangements: Other (Comment) (pt vague, stated lives in house with others ) Name of Psychiatrist: Dr. Lin [name] Name of Therapist: Mikki Santee [name]  Education Status Is patient currently in school?: No Highest grade of school patient has completed: 12th  Risk to self with the past 6 months Suicidal Ideation: No Has patient been a risk to self within the past 6 months prior to admission? : No Suicidal Intent: No Has patient had any suicidal intent within the past 6 months prior to admission? : No Is patient at risk for suicide?: No Suicidal Plan?: No Has patient had any suicidal plan within the past 6 months prior to admission? : No Access to Means: No What has been your use of drugs/alcohol within the last 12 months?: denies Previous Attempts/Gestures: Yes How many times?: 1 Triggers for Past Attempts: Unpredictable Intentional Self Injurious Behavior: Cutting Comment - Self Injurious Behavior: pt reports he has cut in the past  Family Suicide History: No Recent stressful life event(s): Other (Comment) (hallucinations) Persecutory voices/beliefs?: No Depression: Yes Depression Symptoms: Insomnia, Isolating, Loss of interest in usual pleasures Substance abuse history and/or treatment for substance abuse?: No Suicide prevention information given to non-admitted patients: Not applicable  Risk to Others within the past 6 months Homicidal Ideation: No Does patient have any lifetime risk of violence toward others beyond the six months prior to admission? : No Thoughts of Harm to Others: No Current Homicidal Intent: No Current Homicidal Plan: No Access to Homicidal Means: No History of harm to others?: No Assessment of Violence: None Noted Does patient have access to weapons?: No Criminal Charges Pending?: No Does patient have a  court date: No Is patient on probation?: No  Psychosis Hallucinations: Auditory, Visual Delusions: Persecutory  Mental Status Report Appearance/Hygiene: Disheveled, In scrubs Eye Contact: Good Motor Activity: Freedom of movement Speech: Word salad, Incoherent Level of Consciousness: Alert Mood: Anxious, Helpless Affect: Flat Anxiety Level: Minimal Thought Processes: Tangential, Thought Blocking, Flight of Ideas Judgement: Impaired Orientation: Not oriented Obsessive Compulsive Thoughts/Behaviors: None  Cognitive Functioning Concentration: Poor Memory: Remote Impaired, Recent Impaired IQ: Average Insight: Poor Impulse Control: Poor Appetite: Poor Sleep: Decreased Total Hours of Sleep: 4 Vegetative Symptoms: None  ADLScreening [facility] Assessment Services) Patient's cognitive ability adequate to safely complete daily activities?: Yes Patient able to express need for assistance with ADLs?: Yes Independently performs ADLs?: Yes (appropriate for developmental age)  Prior Inpatient Therapy Prior Inpatient Therapy: Yes Prior Therapy Dates: multiple Prior Therapy Facilty/Provider(s): [facility] Reason for Treatment: schizophrenia  Prior Outpatient Therapy Prior Outpatient Therapy: Yes Prior Therapy Dates: current  Prior Therapy Facilty/Provider(s): [employer] [facility]  Reason for Treatment: schizophrenia Does patient have an ACCT team?: No Does patient have Intensive In-House Services?  : No Does patient have  [facility] services? : No Does patient have P4CC services?: No  ADL Screening (condition at time of admission) Patient's cognitive ability adequate to safely complete daily activities?: Yes Is the patient deaf or have difficulty hearing?: No Does the patient have difficulty seeing, even when wearing glasses/contacts?: No Does the patient have difficulty concentrating, remembering, or making decisions?: Yes Patient able to express need for assistance with ADLs?: Yes Does the patient  have difficulty dressing or bathing?: No Independently performs ADLs?: Yes (appropriate for developmental age) Does the patient have difficulty walking or climbing stairs?: No Weakness of Legs: None Weakness of Arms/Hands: None  Home Assistive Devices/Equipment Home Assistive Devices/Equipment: None    Abuse/Neglect Assessment (Assessment to be complete while patient is alone) Physical Abuse: Denies Verbal Abuse: Denies Sexual Abuse: Denies Exploitation of patient/patient's resources: Denies Self-Neglect: Denies     Regulatory affairs officer (For Healthcare) Does Patient Have a Medical Advance Directive?: No Would patient like information on creating a medical advance directive?: No - Patient declined    Additional Information 1:1 In Past 12 Months?: No CIRT Risk: No Elopement Risk: No Does patient have medical clearance?: Yes     Disposition:  Disposition Initial Assessment Completed for this Encounter: Yes Disposition of Patient: Inpatient treatment program Type of inpatient treatment program: Adult (per Albert Clan, PA )  Lyanne Co 01/03/2017 3:46 AM

## 2017-01-03 NOTE — Tx Team (Addendum)
Initial Treatment Plan 01/03/2017 5:48 PM Albert [name] [id number]    PATIENT STRESSORS: Other: Hallucinations/Delusions   PATIENT STRENGTHS: Communication skills   PATIENT IDENTIFIED PROBLEMS: Hallucinations  Delusions  Anxieity  "try reinforce positive thoughts"  SI  depression  AH         DISCHARGE CRITERIA:  Ability to meet basic life and health needs Adequate post-discharge living arrangements Improved stabilization in mood, thinking, and/or behavior Medical problems require only outpatient monitoring Motivation to continue treatment in a less acute level of care Need for constant or close observation no longer present Reduction of life-threatening or endangering symptoms to within safe limits Safe-care adequate arrangements made Verbal commitment to aftercare and medication compliance  PRELIMINARY DISCHARGE PLAN: Referrals indicated:  Per MD/Social Worker  PATIENT/FAMILY INVOLVEMENT: This treatment plan has been presented to and reviewed with the patient, Albert [name], The patient and family have been given the opportunity to ask questions and make suggestions.  Camelia EngKaren H Castro, RN 01/03/2017, 5:48 PM

## 2017-01-03 NOTE — Progress Notes (Signed)
Adult Psychoeducational Group Note  Date:  01/03/2017 Time:  9:20 PM  Group Topic/Focus:  Wrap-Up Group:   The focus of this group is to help patients review their daily goal of treatment and discuss progress on daily workbooks.  Participation Level:  Active  Participation Quality:  Redirectable  Affect:  Appropriate  Cognitive:  Alert  Insight: Limited  Engagement in Group:  Engaged  Modes of Intervention:  Discussion  Additional Comments:  Patient stated that he wanted to try and love himself more. He stated he wants to sleep better tonight and not hear the voice of "Aurther Lofterry" in his head all night. Patient stated his goal was to talk to people more and not isolate to his room.   Albert RimBarbara B Yailin [name] 01/03/2017, 9:20 PM

## 2017-01-03 NOTE — Progress Notes (Signed)
Admission Note: [facility], a 41 y[facility]  [facility] reportedly presented with passive SI, reporting that he felt lost and empty.  At current time patient denies suicidal and homicidal ideation.  He does say that he is able to "project images with his mind" and that he can't get a guy named "Aurther Lofterry" out of his head. Aurther Lofterry was previously Dietitianpatient's counselor. Patient reportedly hears Aurther Lofterry telling him that "you're a bad person, you never amount to anything, you're pathetic." He also reportedly has had delusions regarding Aurther Lofterry burning [place] and has had visual hallucinations seeing people with orange eyes.  He sates he has not drank alcohol in 11 months and attends AA but reportedly has delusions on people from AA seeing his thoughts. Patient is pleasant and cooperative.   Patient belongings checked for contraband and belongings that patient could not take back to room locked up by Security.  Skin assessment done and witnessed by two staff members.  Skin assessment was WDL. Patient oriented to room, unit and plan of care.  Encouraged him to seek assistance with needs/concerns.  Patient on q 15 minute safety checks.

## 2017-01-03 NOTE — ED Provider Notes (Signed)
[place] DEPT Provider Note   CSN: [id number] Arrival date & time: 01/02/17  2323     History   Chief Complaint Chief Complaint  Patient presents with  . Hallucinations    HPI Albert [name] is a 41 y.o. male.  HPI Patient presents to the emergency department with hallucinations that have been occurring over the last few months.  The patient states that he is also having some delusions about someone that he met several years ago speaking to him and talking poorly about him.  The patient states that he is to reduce his medicine on his own without consulting his doctor.  The patient states that he had trouble sleeping over the last little while, but she will not specify because of continued thoughts and hearing voices. The patient denies chest pain, shortness of breath, headache,blurred vision, neck pain, fever, cough, weakness, numbness, dizziness, anorexia, edema, abdominal pain, nausea, vomiting, diarrhea, rash, back pain, dysuria, hematemesis, bloody stool, near syncope, or syncope. Past Medical History:  Diagnosis Date  . Alcoholism ([place])   . Drug abuse    xanax addiction 5 years ago  . Medical non-compliance   . Paranoid schizophrenia ([place])   . Seizures [facility])     Patient Active Problem List   Diagnosis Date Noted  . Paranoid schizophrenia ([place])   . Alcoholism ([place])   . Drug abuse   . Medical non-compliance   . Schizophrenia, paranoid type ([place]) 01/19/2012    History reviewed. No pertinent surgical history.     Home Medications    Prior to Admission medications   Medication Sig Start Date End Date Taking? Authorizing Provider  divalproex (DEPAKOTE ER) 500 MG 24 hr tablet Take 3 tablets (1,500 mg total) by mouth at bedtime. For mood stability. 01/27/13  Yes Milta Deiters T Mashburn, PA-C  QUEtiapine (SEROQUEL XR) 300 MG 24 hr tablet Take 2 tablets (600 mg total) by mouth daily. For bipolar mania. 01/27/13  Yes Milta Deiters T Mashburn, PA-C  benztropine (COGENTIN) 2 MG tablet  Take 1 tablet (2 mg total) by mouth 2 (two) times daily. Patient not taking: Reported on 01/03/2017 01/27/13   Marlane Hatcher Mashburn, PA-C  ondansetron (ZOFRAN-ODT) 8 MG disintegrating tablet Take 1 tablet (8 mg total) by mouth every 8 (eight) hours as needed. For nausea and vomiting Patient not taking: Reported on 01/03/2017 01/27/13   Marlane Hatcher Mashburn, PA-C  pantoprazole (PROTONIX) 40 MG tablet Take 2 tablets (80 mg total) by mouth daily. For acid reflux Patient not taking: Reported on 01/03/2017 01/27/13   Ruben Im, PA-C    Family History No family history on file.  Social History Social History  Substance Use Topics  . Smoking status: Former Research scientist (life sciences)  . Smokeless tobacco: Never Used  . Alcohol use No     Comment: 11 months sober in March 2018     Allergies   Patient has no known allergies.   Review of Systems Review of Systems All other systems negative except as documented in the HPI. All pertinent positives and negatives as reviewed in the HPI.  Physical Exam Updated Vital Signs BP 156/97 (BP Location: Right Arm)   Pulse (!) 121   Temp 97.4 F (36.3 C) (Oral)   Resp 20   SpO2 93%   Physical Exam  Constitutional: He is oriented to person, place, and time. He appears well-developed and well-nourished. No distress.  HENT:  Head: Normocephalic and atraumatic.  Mouth/Throat: Oropharynx is clear and moist.  Eyes: Pupils are equal, round,  and reactive to light.  Neck: Normal range of motion. Neck supple.  Cardiovascular: Regular rhythm and normal heart sounds.  Tachycardia present.  Exam reveals no gallop and no friction rub.   No murmur heard. Pulmonary/Chest: Effort normal and breath sounds normal. No respiratory distress. He has no wheezes.  Abdominal: Soft. Bowel sounds are normal. He exhibits no distension. There is no tenderness.  Neurological: He is alert and oriented to person, place, and time. He exhibits normal muscle tone. Coordination normal.  Skin: Skin is warm and  dry. Capillary refill takes less than 2 seconds. No rash noted. No erythema.  Psychiatric: He has a normal mood and affect. His behavior is normal. Judgment normal. He is actively hallucinating. Thought content is delusional. Cognition and memory are normal. He expresses no homicidal and no suicidal ideation. He expresses no suicidal plans and no homicidal plans.  Nursing note and vitals reviewed.    ED Treatments / Results  Labs (all labs ordered are listed, but only abnormal results are displayed) Labs Reviewed  COMPREHENSIVE METABOLIC PANEL - Abnormal; Notable for the following:       Result Value   CO2 21 (*)    Glucose, Bld 111 (*)    All other components within normal limits  ACETAMINOPHEN LEVEL - Abnormal; Notable for the following:    Acetaminophen (Tylenol), Serum <10 (*)    All other components within normal limits  ETHANOL  SALICYLATE LEVEL  CBC  RAPID URINE DRUG SCREEN, HOSP PERFORMED    EKG  EKG Interpretation None       Radiology No results found.  Procedures Procedures (including critical care time)  Medications Ordered in ED Medications - No data to display   Initial Impression / Assessment and Plan / ED Course  I have reviewed the triage vital signs and the nursing notes.  Pertinent labs & imaging results that were available during my care of the patient were reviewed by me and considered in my medical decision making (see chart for details).     Patient may TTS assessment for the hallucinations and delusions  Final Clinical Impressions(s) / ED Diagnoses   Final diagnoses:  None    New Prescriptions New Prescriptions   No medications on file     Edmon Magid, PA-C 01/03/17 0231    April Palumbo, MD 01/03/17 [phone]
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
## 2017-01-03 NOTE — ED Notes (Signed)
Pt presents with passive SI, no plan, reports feeling lost and empty.  Denies HI and admits to hearing the voice of a Michelle PiperGuy named Aurther Lofterry.  A&O x 3, no distress noted, cooperative and slightly anxious.  Monitoring for safety, Q 15 min checks in effect.  Safety check for contraband completed, no items found.

## 2017-01-03 NOTE — Progress Notes (Signed)
Patient ID: Albert LeftChristopher Baiz, male   DOB: 02/22/1976, [age] y.o.   MRN: [id number] Per State regulations 482.30 this chart was reviewed for medical necessity with respect to the patient's admission/duration of stay.    Next review date: 01/07/17  Thurman CoyerEric Sevyn Paredez, BSN, RN-BC  Case Manager

## 2017-01-03 NOTE — Progress Notes (Signed)
Patient ID: Albert [name], male   DOB: 09/19/1976, 41 y.o.   MRN: [id number] D: client visible on the unit, seen in dayroom and in the hall. Client appears restless, spends lot of time at nurses station. Client appears to fixate on staff and follows them around. Client is intrusive, invades personal space. Client reports "had a rough day having negative thoughts, trying to turn it around by reinforcing positive coping skills" A: Writer provided emotional support, redirects client repeatedly referring to treatment agreement. Medications reviewed administered as ordered. Client adamant about taking Seroquel tonight noting that he takes 800 mg at home. Client continue to ask for medication during the night. Staff will monitor q3415min for safety. R: Client is safe on the unit, attended group.

## 2017-01-03 NOTE — BH Assessment (Signed)
Attempted to complete the assessment with the pt but Vanita, RN [facility] Requested 10 minutes to allow for pt transfer. Will call back to complete the assessment once pt is in [facility].  Princess BruinsAquicha Duff, MSW, Theresia MajorsLCSWA

## 2017-01-04 ENCOUNTER — Encounter (HOSPITAL_COMMUNITY): Payer: Self-pay | Admitting: Psychiatry

## 2017-01-04 DIAGNOSIS — Z9119 Patient's noncompliance with other medical treatment and regimen: Secondary | ICD-10-CM

## 2017-01-04 DIAGNOSIS — F1021 Alcohol dependence, in remission: Secondary | ICD-10-CM

## 2017-01-04 DIAGNOSIS — Z79899 Other long term (current) drug therapy: Secondary | ICD-10-CM

## 2017-01-04 DIAGNOSIS — F1221 Cannabis dependence, in remission: Secondary | ICD-10-CM | POA: Clinically undetermined

## 2017-01-04 DIAGNOSIS — F1910 Other psychoactive substance abuse, uncomplicated: Secondary | ICD-10-CM

## 2017-01-04 DIAGNOSIS — Z818 Family history of other mental and behavioral disorders: Secondary | ICD-10-CM

## 2017-01-04 DIAGNOSIS — F250 Schizoaffective disorder, bipolar type: Principal | ICD-10-CM | POA: Diagnosis present

## 2017-01-04 MED ORDER — ASENAPINE MALEATE 5 MG SL SUBL
5.0000 mg | SUBLINGUAL_TABLET | Freq: Two times a day (BID) | SUBLINGUAL | Status: DC | PRN
  Administered 2017-01-05 – 2017-01-07 (×2): 5 mg via SUBLINGUAL
  Filled 2017-01-04 (×3): qty 1

## 2017-01-04 MED ORDER — BENZTROPINE MESYLATE 0.5 MG PO TABS
0.5000 mg | ORAL_TABLET | Freq: Two times a day (BID) | ORAL | Status: DC
  Administered 2017-01-04 – 2017-01-09 (×10): 0.5 mg via ORAL
  Filled 2017-01-04 (×14): qty 1

## 2017-01-04 MED ORDER — HYDROXYZINE HCL 25 MG PO TABS
25.0000 mg | ORAL_TABLET | Freq: Four times a day (QID) | ORAL | Status: DC | PRN
  Administered 2017-01-04 – 2017-01-08 (×4): 25 mg via ORAL
  Filled 2017-01-04 (×4): qty 1

## 2017-01-04 MED ORDER — DIVALPROEX SODIUM ER 250 MG PO TB24
1250.0000 mg | ORAL_TABLET | Freq: Every day | ORAL | Status: DC
  Administered 2017-01-04 – 2017-01-08 (×5): 1250 mg via ORAL
  Filled 2017-01-04 (×2): qty 1
  Filled 2017-01-04: qty 2
  Filled 2017-01-04 (×5): qty 1

## 2017-01-04 NOTE — Social Work (Signed)
Referred to [facility], is [place] resident.  Brayam Boeke, LCSW Lead Clinical Social Worker Phone:  [phone]  

## 2017-01-04 NOTE — Progress Notes (Addendum)
D:  Patient awake and alert; he denies suicidal and homicidal ideation and AVH at present time; he states that he has been no problem with "Aurther Lofterry in his head today."  No self-injurious behaviors noted or reported. During group patient vomited x 1; I asked patient if he was nauseated and he stated that he was not nauseated. When asked if he knew why he vomited, he said "Yes, these bitches here; this always happens when I'm on an emotional roller coaster." A:  Medications given as scheduled;  Emotional support provided; encouraged him to seek assistance with needs/concerns. R:  Safety maintained on unit.
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
## 2017-01-04 NOTE — Progress Notes (Signed)
Recreation Therapy Notes  INPATIENT RECREATION THERAPY ASSESSMENT  Patient Details Name: Albert [name] MRN: [id number] DOB: 05/09/1976 Today's Date: 01/04/2017  Patient Stressors: Other (Comment) (Projecting images, hearing voices)  Pt stated he was here for psychotic disorder and hearing voices.  Coping Skills:   Avoidance, Exercise, Art/Dance, Talking, Music  Personal Challenges: Communication, Concentration, Decision-Making, Stress Management  Leisure Interests (2+):  Exercise - Walking, Music - Listen, Individual - Reading  Awareness of Community Resources:  Yes  Community Resources:  [employer], [employer]  Current Use: Yes  Patient Strengths:  Ask for help when I need it; confidence  Patient Identified Areas of Improvement:  Working with thoughts; accepting who I am  Current Recreation Participation:  Once every couple weeks  Patient Goal for Hospitalization:  "Not think of myself as a loser"  [place]idence[place]  [place] Residence[place]  Current [place] (including self-harm):  No  Current HI:  No  Consent to Intern Participation: N/A   Caroll RancherMarjette Kayden Hutmacher, LRT/CTRS  Caroll RancherLindsay, Lurline Caver A 01/04/2017, 2:08 PM

## 2017-01-04 NOTE — Plan of Care (Signed)
Problem: Safety: Goal: Periods of time without injury will increase Outcome: Progressing Safety maintained on unit  Problem: Medication: Goal: Compliance with prescribed medication regimen will improve Outcome: Progressing Compliant with medication regimen

## 2017-01-04 NOTE — [facility] Suicide Risk Assessment (Signed)
[facility] INPATIENT:  Family/Significant Other Suicide Prevention Education  Suicide Prevention Education:  Education Completed; No one has been identified by the patient as the family member/significant other with whom the patient will be residing, and identified as the person(s) who will aid the patient in the event of a mental health crisis (suicidal ideations/suicide attempt).  With written consent from the patient, the family member/significant other has been provided the following suicide prevention education, prior to the and/or following the discharge of the patient.  The suicide prevention education provided includes the following:  Suicide risk factors  Suicide prevention and interventions  National Suicide Hotline telephone number  [facility] assessment telephone number  [facility] Emergency Assistance 911  [facility] [place] [facility] and/or Residential Mobile Crisis Unit telephone number  Request made of family/significant other to:  Remove weapons (e.g., guns, rifles, knives), all items previously/currently identified as safety concern.    Remove drugs/medications (over-the-counter, prescriptions, illicit drugs), all items previously/currently identified as a safety concern.  The family member/significant other verbalizes understanding of the suicide prevention education information provided.  The family member/significant other agrees to remove the items of safety concern listed above. The patient did not endorse SI at the time of admission, nor did the patient c/o SI during the stay here.  SPE not required.   [name] 01/04/2017, 11:34 AM
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
## 2017-01-04 NOTE — Progress Notes (Signed)
Recreation Therapy Notes  Date: 01/04/17 Time: 1000 Location: 500 Hall Dayroom  Group Topic: Self-Expression  Goal Area(s) Addresses:  Patient will identify positive benefits of expressing self. Patient will verbalize benefit of increased self-expression post d/c.  Behavioral Response: Engaged  Intervention: Picture frame worksheets, magazines, scissors, construction paper, markers, glue sticks  Activity: Picture This.  Patients were to envision the place they would most want to travel to if they had the means to do so or something meaning full they want to accomplish.  They were to then take those ideas and try to find pictures from the magazines or draw what they visualized that place to be inside the picture frame.    Education:  Self-Expression, Building control surveyorDischarge Planning.   Education Outcome: Acknowledges education/In group clarification offered/Needs additional education  Clinical Observations/Feedback: Pt [place] because it had more stuff to than where he used to live.  Pt did become sick at one point and threw up.  Pt left but came back and expressed he felt.   Caroll RancherMarjette Jaythan Hinely, LRT/CTRS       Caroll RancherLindsay, Gloria Ricardo A 01/04/2017 12:51 PM

## 2017-01-04 NOTE — B[facility] Counselor (Signed)
Adult Comprehensive Assessment  Patient ID: Albert [name], male   DOB: 07/17/1976, 41 y.o.   MRN: [id number]  Information Source: Information source: Patient  Current Stressors:   Employment / Job issues: Web designerDisability Financial / Lack of resources (include bankruptcy): Fixed income Substance abuse: Attends AA daily  Living/Environment/Situation:  Living Arrangements: Alone Living conditions (as described by patient or guardian): Good How long has patient lived in current situation?: Since 2007 What is atmosphere in current home: Comfortable  Family History:  Marital status: Single Does patient have children?: No  Childhood History:  By whom was/is the patient raised?: Both parents Additional childhood history information: Mother mainly took care of Albert [name] Description of patient's relationship with caregiver when they were a child: Good Patient's description of current relationship with people who raised him/her: Good Does patient have siblings?: Yes Number of Siblings: 3 (Has a twin sister and a half brother/sister) Description of patient's current relationship with siblings: Pretty good Did patient suffer any verbal/emotional/physical/sexual abuse as a child?: No Did patient suffer from severe childhood neglect?: No Has patient ever been sexually abused/assaulted/raped as an adolescent or adult?: No Was the patient ever a victim of a crime or a disaster?: No Witnessed domestic violence?: No Has patient been effected by domestic violence as an adult?: No  Education:  Highest grade of school patient has completed: 12th grade Currently a student?: No Learning disability?: No  Employment/Work Situation:  Employment situation: On disability Why is patient on disability: Biopolar/Depression How long has patient been on disability: about 5 years Patient's job has been impacted by current illness: No What is the longest time patient has a held a job?: 3 years Where was  the patient employed at that time?: [employer] Has patient ever been in the Albert [name] and Companymilitary?: No Has patient ever served in Buyer, retailcombat?: No  Financial Resources:  Surveyor, quantityinancial resources: Insurance claims handlereceives SSDI Does patient have a Lawyerrepresentative payee or guardian?: No  Alcohol/Substance Abuse:  What has been your use of drugs/alcohol within the last 12 months?: None If attempted suicide, did drugs/alcohol play a role in this?: No Alcohol/Substance Abuse Treatment Hx: Denies past history If yes, describe treatment: N/A Has alcohol/substance abuse ever caused legal problems?: No  Social Support System: Conservation officer, natureatient's Community Support System: Good Describe Community Support System: Family Type of faith/religion: [place] How does patient's faith help to cope with current illness?: Unable to say  Leisure/Recreation:  Leisure and Hobbies: Bike riding, Orthoptistjournaling, conversations with others  Strengths/Needs:  What things does the patient do well?: Honesty, loyalty and integrity. Learning to have good human relations In what areas does patient struggle / problems for patient: Dealing with people that are pushy and that rush me  Discharge Plan:  Does patient have access to transportation?: Yes Will patient be returning to same living situation after discharge?: Yes Currently receiving community mental health services: Yes (From Whom) [facility]; [facility] Short[facility] [name] If no, would patient like referral for services when discharged?: Does patient have financial barriers related to discharge medications?: No     Summary/Recommendations:   Summary and Recommendations (to be completed by the evaluator): Albert [name] is a 41 YO Caucasian male diagnosed with Schizophrenia.  He presents voluntarily with paranoia, disorganization and some resulting degree of anxiety due to "weaning myself down on my medications."  Albert [name] lives independantly in the community and follows up with Albert [name] and Albert ShortBob [name], as well as regular AA  meetings.  Albert [name] can benefit from crises stabilization, medication management, therapeutic milieu and referral for  services.  Albert [name]. 01/04/2017

## 2017-01-04 NOTE — Tx Team (Signed)
Interdisciplinary Treatment and Diagnostic Plan Update  01/04/2017 Time of Session: 3:36 PM  Albert [name] MRN: [id number]  Principal Diagnosis: Schizoaffective disorder, bipolar type [facility])  Secondary Diagnoses: Principal Problem:   Schizoaffective disorder, bipolar type ([place]) Active Problems:   Moderate cannabis use disorder, in early remission (HCC)   Alcohol use disorder, moderate, in early remission ([place])   Current Medications:  Current Facility-Administered Medications  Medication Dose Route Frequency Provider Last Rate Last Dose  . acetaminophen (TYLENOL) tablet 650 mg  650 mg Oral Q4H PRN Patrecia Pour, NP      . alum & mag hydroxide-simeth (MAALOX/MYLANTA) 200-200-20 MG/5ML suspension 30 mL  30 mL Oral PRN Patrecia Pour, NP      . asenapine (SAPHRIS) sublingual tablet 5 mg  5 mg Sublingual BID PRN Ursula Alert, MD      . benztropine (COGENTIN) tablet 0.5 mg  0.5 mg Oral BID Ursula Alert, MD      . divalproex (DEPAKOTE ER) 24 hr tablet 1,250 mg  1,250 mg Oral QHS Saramma Eappen, MD      . hydrOXYzine (ATARAX/VISTARIL) tablet 25 mg  25 mg Oral Q6H PRN Rozetta Nunnery, NP   25 mg at 01/04/17 0051  . ibuprofen (ADVIL,MOTRIN) tablet 600 mg  600 mg Oral Q8H PRN Patrecia Pour, NP      . magnesium hydroxide (MILK OF MAGNESIA) suspension 30 mL  30 mL Oral Daily PRN Patrecia Pour, NP      . ondansetron [facility]) tablet 4 mg  4 mg Oral Q8H PRN Patrecia Pour, NP      . ondansetron (ZOFRAN-ODT) disintegrating tablet 8 mg  8 mg Oral Q8H PRN Patrecia Pour, NP      . pantoprazole (PROTONIX) EC tablet 80 mg  80 mg Oral Daily Patrecia Pour, NP   80 mg at 01/04/17 0736  . QUEtiapine (SEROQUEL XR) 24 hr tablet 600 mg  600 mg Oral QHS Patrecia Pour, NP   600 mg at 01/03/17 2203    PTA Medications: Prescriptions Prior to Admission  Medication Sig Dispense Refill Last Dose  . benztropine (COGENTIN) 2 MG tablet Take 1 tablet (2 mg total) by mouth 2 (two) times daily. (Patient not  taking: Reported on 01/03/2017) 60 tablet 0 Not Taking at Unknown time  . divalproex (DEPAKOTE ER) 500 MG 24 hr tablet Take 3 tablets (1,500 mg total) by mouth at bedtime. For mood stability. 90 tablet 0 01/02/2017 at Unknown time  . ondansetron (ZOFRAN-ODT) 8 MG disintegrating tablet Take 1 tablet (8 mg total) by mouth every 8 (eight) hours as needed. For nausea and vomiting (Patient not taking: Reported on 01/03/2017) 20 tablet 0 Not Taking at Unknown time  . pantoprazole (PROTONIX) 40 MG tablet Take 2 tablets (80 mg total) by mouth daily. For acid reflux (Patient not taking: Reported on 01/03/2017)   Not Taking at Unknown time  . QUEtiapine (SEROQUEL XR) 300 MG 24 hr tablet Take 2 tablets (600 mg total) by mouth daily. For bipolar mania. 60 tablet 0 01/02/2017 at Unknown time    Treatment Modalities: Medication Management, Group therapy, Case management,  1 to 1 session with clinician, Psychoeducation, Recreational therapy.   Physician Treatment Plan for Primary Diagnosis: Schizoaffective disorder, bipolar type ([place]) Long Term Goal(s): Improvement in symptoms so as ready for discharge  Short Term Goals: Compliance with prescribed medications will improve  Medication Management: Evaluate patient's response, side effects, and tolerance of medication regimen.  Therapeutic Interventions:  1 to 1 sessions, Unit Group sessions and Medication administration.  Evaluation of Outcomes: Progressing  Physician Treatment Plan for Secondary Diagnosis: Principal Problem:   Schizoaffective disorder, bipolar type ([place]) Active Problems:   Moderate cannabis use disorder, in early remission (HCC)   Alcohol use disorder, moderate, in early remission ([place])   Long Term Goal(s): Improvement in symptoms so as ready for discharge  Short Term Goals: Ability to identify changes in lifestyle to reduce recurrence of condition will improve    Medication Management: Evaluate patient's response, side effects, and  tolerance of medication regimen.  Therapeutic Interventions: 1 to 1 sessions, Unit Group sessions and Medication administration.  Evaluation of Outcomes: Progressing   RN Treatment Plan for Primary Diagnosis: Schizoaffective disorder, bipolar type ([place]) Long Term Goal(s): Knowledge of disease and therapeutic regimen to maintain health will improve  Short Term Goals: Ability to identify and develop effective coping behaviors will improve and Compliance with prescribed medications will improve  Medication Management: RN will administer medications as ordered by provider, will assess and evaluate patient's response and provide education to patient for prescribed medication. RN will report any adverse and/or side effects to prescribing provider.  Therapeutic Interventions: 1 on 1 counseling sessions, Psychoeducation, Medication administration, Evaluate responses to treatment, Monitor vital signs and CBGs as ordered, Perform/monitor CIWA, COWS, AIMS and Fall Risk screenings as ordered, Perform wound care treatments as ordered.  Evaluation of Outcomes: Progressing   LCSW Treatment Plan for Primary Diagnosis: Schizoaffective disorder, bipolar type ([place]) Long Term Goal(s): Safe transition to appropriate next level of care at discharge, Engage patient in therapeutic group addressing interpersonal concerns.  Short Term Goals: Engage patient in aftercare planning with referrals and resources  Therapeutic Interventions: Assess for all discharge needs, 1 to 1 time with Social worker, Explore available resources and support systems, Assess for adequacy in community support network, Educate family and significant other(s) on suicide prevention, Complete Psychosocial Assessment, Interpersonal group therapy.  Evaluation of Outcomes: Met  Return home, follow up [facility] and [place] in Treatment: Attending groups: Yes Participating in groups: Yes Taking medication as prescribed:  Yes Toleration medication: Yes, no side effects reported at this time Family/Significant other contact made: No Patient understands diagnosis: Yes AEB asking for help getting back on usual dose of meds Discussing patient identified problems/goals with staff: Yes Medical problems stabilized or resolved: Yes Denies suicidal/homicidal ideation: Yes Issues/concerns per patient self-inventory: None Other: N/A  New problem(s) identified: None identified at this time.   New Short Term/Long Term Goal(s): None identified at this time.   Discharge Plan or Barriers:   Reason for Continuation of Hospitalization:  Delusions  Hallucinations Medication stabilization   Estimated Length of Stay: 3-5 days  Attendees: Patient: 01/04/2017  3:36 PM  Physician: Ursula Alert, MD 01/04/2017  3:36 PM  Nursing: Hoy Register, RN 01/04/2017  3:36 PM  RN Care Manager: Lars Pinks, RN 01/04/2017  3:36 PM  Social Worker: [name] Fraise 01/04/2017  3:36 PM  Recreational Therapist: Laretta Bolster  01/04/2017  3:36 PM  Other: Norberto Sorenson 01/04/2017  3:36 PM  Other:  01/04/2017  3:36 PM    Scribe for Treatment Team:  Roque Lias LCSW 01/04/2017 3:36 PM

## 2017-01-04 NOTE — B[facility] Group Notes (Signed)
[facility] LCSW Group Therapy  01/04/2017  1:05 PM  Type of Therapy:  Group therapy  Participation Level:  Active  Participation Quality:  Attentive  Affect:  Flat  Cognitive:  Oriented  Insight:  Limited  Engagement in Therapy:  Limited  Modes of Intervention:  Discussion, Socialization  Summary of Progress/Problems:  Chaplain was here to lead a group on themes of hope and courage.  "Hope is faith in yourself and others.  It's knowing that they will be there for you.  Cited his mom, sisters and step dad as supports.  Daryel Geraldorth, Yovan Leeman B 01/04/2017 1:04 PM

## 2017-01-05 DIAGNOSIS — R000 Tachycardia, unspecified: Secondary | ICD-10-CM | POA: Clinically undetermined

## 2017-01-05 DIAGNOSIS — R7989 Other specified abnormal findings of blood chemistry: Secondary | ICD-10-CM | POA: Clinically undetermined

## 2017-01-05 LAB — TSH: TSH: 7.41 u[IU]/mL — ABNORMAL HIGH (ref 0.350–4.500)

## 2017-01-05 LAB — LIPID PANEL
CHOLESTEROL: 172 mg/dL (ref 0–200)
HDL: 41 mg/dL (ref >40)
LDL CALC: 109 mg/dL — AB (ref 0–99)
Total CHOL/HDL Ratio: 4.2 RATIO
Triglycerides: 112 mg/dL (ref <150)
VLDL: 22 mg/dL (ref 0–40)

## 2017-01-05 LAB — VALPROIC ACID LEVEL: Valproic Acid Lvl: 91 ug/mL (ref 50.0–100.0)

## 2017-01-05 MED ORDER — QUETIAPINE FUMARATE ER 400 MG PO TB24
700.0000 mg | ORAL_TABLET | Freq: Every day | ORAL | Status: DC
  Administered 2017-01-05 – 2017-01-07 (×3): 700 mg via ORAL
  Filled 2017-01-05 (×8): qty 1

## 2017-01-05 MED ORDER — PROPRANOLOL HCL 10 MG PO TABS
10.0000 mg | ORAL_TABLET | Freq: Two times a day (BID) | ORAL | Status: DC
  Administered 2017-01-05 – 2017-01-06 (×2): 10 mg via ORAL
  Filled 2017-01-05 (×6): qty 1

## 2017-01-05 MED ORDER — ZOLPIDEM TARTRATE 5 MG PO TABS
5.0000 mg | ORAL_TABLET | Freq: Every evening | ORAL | Status: DC | PRN
  Filled 2017-01-05: qty 1

## 2017-01-05 MED ORDER — GABAPENTIN 100 MG PO CAPS
100.0000 mg | ORAL_CAPSULE | ORAL | Status: DC
  Administered 2017-01-05 – 2017-01-06 (×3): 100 mg via ORAL
  Filled 2017-01-05 (×10): qty 1

## 2017-01-05 NOTE — Progress Notes (Signed)
[facility]  01/05/2017 11:59 AM Albert LeftChristopher [name]  MRN:  [id number] Subjective: Patient states " I have racing thoughts, My mood is still up and down, I did not sleep last night.'  Objective:Patient seen and chart reviewed.Discussed patient with treatment team.  Pt today seen as anxious , labile, continues to have racing thoughts. Pt reports restless sleep. Per staff - pt has been compliant on medications, however continues to need a lot of reassurance and support. Will readjust his medications.     Principal Problem: Schizoaffective disorder, bipolar type (HCC) Diagnosis:   Patient Active Problem List   Diagnosis Date Noted  . Elevated TSH [R94.6] 01/05/2017  . Tachycardia [R00.0] 01/05/2017  . Schizoaffective disorder, bipolar type (HCC) [F25.0] 01/04/2017  . Moderate cannabis use disorder, in early remission (HCC) [F12.21] 01/04/2017  . Alcohol use disorder, moderate, in early remission (HCC) [F10.21] 01/04/2017  . Alcoholism (HCC) [F10.20]   . Drug abuse [F19.10]   . Medical non-compliance [Z91.19]    Total Time spent with patient: 25 minutes  Past Psychiatric History: Please see H&P.   Past Medical History:  Past Medical History:  Diagnosis Date  . Alcoholism (HCC)   . Drug abuse    xanax addiction 5 years ago  . Medical non-compliance   . Paranoid schizophrenia (HCC)   . Seizures (HCC)    History reviewed. No pertinent surgical history. Family History:  Family History  Problem Relation Age of Onset  . Depression Maternal Aunt    Family Psychiatric  History: Please see H&P.  Social History: Please see H&P.  History  Alcohol Use No    Comment: 11 months sober in March 2018     History  Drug Use No    Social History   Social History  . Marital status: Single    Spouse name: N/A  . Number of children: N/A  . Years of education: N/A   Social History Main Topics  . Smoking status: Former Games developermoker  . Smokeless tobacco: Never Used      Comment: Non-smoker  . Alcohol use No     Comment: 11 months sober in March 2018  . Drug use: No  . Sexual activity: Not Currently   Other Topics Concern  . None   Social History Narrative  . None   Additional Social History:                         Sleep: Poor  Appetite:  Fair  Current Medications: Current Facility-Administered Medications  Medication Dose Route Frequency Provider Last Rate Last Dose  . acetaminophen (TYLENOL) tablet 650 mg  650 mg Oral Q4H PRN Albert RingsJamison Y Lord, NP      . alum & mag hydroxide-simeth (MAALOX/MYLANTA) 200-200-20 MG/5ML suspension 30 mL  30 mL Oral PRN Albert RingsJamison Y Lord, NP      . asenapine (SAPHRIS) sublingual tablet 5 mg  5 mg Sublingual BID PRN Albert LongsSaramma Anjali Manzella, MD   5 mg at 01/05/17 0818  . benztropine (COGENTIN) tablet 0.5 mg  0.5 mg Oral BID Albert LongsSaramma Britni Driscoll, MD   0.5 mg at 01/05/17 0816  . divalproex (DEPAKOTE ER) 24 hr tablet 1,250 mg  1,250 mg Oral QHS Albert LongsSaramma Wilda Wetherell, MD   1,250 mg at 01/04/17 2114  . gabapentin (NEURONTIN) capsule 100 mg  100 mg Oral [id number] Albert Weseman, MD      . hydrOXYzine (ATARAX/VISTARIL) tablet 25 mg  25 mg Oral Q6H PRN Albert PolingJason A Berry, NP  25 mg at 01/04/17 0051  . ibuprofen (ADVIL,MOTRIN) tablet 600 mg  600 mg Oral Q8H PRN Albert Rings, NP      . magnesium hydroxide (MILK OF MAGNESIA) suspension 30 mL  30 mL Oral Daily PRN Albert Rings, NP      . ondansetron [facility]) tablet 4 mg  4 mg Oral Q8H PRN Albert Rings, NP      . ondansetron (ZOFRAN-ODT) disintegrating tablet 8 mg  8 mg Oral Q8H PRN Albert Rings, NP      . pantoprazole (PROTONIX) EC tablet 80 mg  80 mg Oral Daily Albert Rings, NP   80 mg at 01/05/17 0816  . propranolol (INDERAL) tablet 10 mg  10 mg Oral BID Albert Longs, MD      . QUEtiapine (SEROQUEL XR) 24 hr tablet 700 mg  700 mg Oral QHS Albert Alcalde, MD      . zolpidem (AMBIEN) tablet 5 mg  5 mg Oral QHS PRN Albert Longs, MD        Lab Results:  Results for orders placed or  performed during the hospital encounter of 01/03/17 (from the past 48 hour(s))  Valproic acid level     Status: None   Collection Time: 01/05/17  6:20 AM  Result Value Ref Range   Valproic Acid Lvl 91 50.0 - 100.0 ug/mL    Comment: Performed at [facility], [address], [place], [place] [address]  TSH     Status: Abnormal   Collection Time: 01/05/17  6:20 AM  Result Value Ref Range   TSH 7.410 (H) 0.350 - 4.500 uIU/mL    Comment: Performed by a 3rd Generation assay with a functional sensitivity of <=0.01 uIU/mL. Performed at [facility], [address] [place], [place] [address]   Lipid panel     Status: Abnormal   Collection Time: 01/05/17  6:20 AM  Result Value Ref Range   Cholesterol 172 0 - 200 mg/dL   Triglycerides 098 <119 mg/dL   HDL 41 >14 mg/dL   Total CHOL/HDL Ratio 4.2 RATIO   VLDL 22 0 - 40 mg/dL   LDL Cholesterol 782 (H) 0 - 99 mg/dL    Comment:        Total Cholesterol/HDL:CHD Risk Coronary Heart Disease Risk Table                     Men   Women  1/2 Average Risk   3.4   3.3  Average Risk       5.0   4.4  2 X Average Risk   9.6   7.1  3 X Average Risk  23.4   11.0        Use the calculated Patient Ratio above and the CHD Risk Table to determine the patient's CHD Risk.        ATP III CLASSIFICATION (LDL):  <100     mg/dL   Optimal  956-213  mg/dL   Near or Above                    Optimal  130-159  mg/dL   Borderline  086-578  mg/dL   High  >469     mg/dL   Very High Performed at [facility], [address], [place], [place] [address]     Blood Alcohol level:  Lab Results  Component Value Date   [facility] <5 01/03/2017  ETH <11 01/22/2013    Metabolic Disorder Labs: No results found for: HGBA1C, MPG No results found for: PROLACTIN Lab Results  Component Value Date   CHOL 172 01/05/2017   TRIG 112 01/05/2017   HDL 41 01/05/2017   CHOLHDL 4.2 01/05/2017   VLDL 22 01/05/2017   LDLCALC 109 (H)  01/05/2017    Physical Findings: AIMS: Facial and Oral Movements Muscles of Facial Expression: None, normal Lips and Perioral Area: None, normal Jaw: None, normal Tongue: None, normal,Extremity Movements Upper (arms, wrists, hands, fingers): None, normal Lower (legs, knees, ankles, toes): None, normal, Trunk Movements Neck, shoulders, hips: None, normal, Overall Severity Severity of abnormal movements (highest score from questions above): None, normal Incapacitation due to abnormal movements: None, normal Patient's awareness of abnormal movements (rate only patient's report): No Awareness, Dental Status Current problems with teeth and/or dentures?: No Does patient usually wear dentures?: No  CIWA:    COWS:     Musculoskeletal: Strength & Muscle Tone: within normal limits Gait & Station: normal Patient leans: N/A  Psychiatric Specialty Exam: Physical Exam  Nursing note and vitals reviewed.   Review of Systems  Psychiatric/Behavioral: Positive for depression. The patient is nervous/anxious and has insomnia.   All other systems reviewed and are negative.   Blood pressure 117/79, pulse (!) 115, temperature 98.7 F (37.1 C), temperature source Oral, resp. rate 18, height 5\' 11"  (1.803 m), weight 99.8 kg (220 lb), SpO2 99 %.Body mass index is 30.68 kg/m.  General Appearance: Casual  Eye Contact:  Fair  Speech:  Normal Rate  Volume:  Normal  Mood:  Anxious, Depressed and Dysphoric  Affect:  Congruent  Thought Process:  Goal Directed and Descriptions of Associations: Circumstantial  Orientation:  Full (Time, Place, and Person)  Thought Content:  Rumination  Suicidal Thoughts:  No  Homicidal Thoughts:  No  Memory:  Immediate;   Fair Recent;   Fair Remote;   Fair  Judgement:  Impaired  Insight:  Shallow  Psychomotor Activity:  Normal  Concentration:  Concentration: Poor and Attention Span: Poor  Recall:  [employer] of Knowledge:  Fair  Language:  Fair  Akathisia:  No   Handed:  Right  AIMS (if indicated):     Assets:  Communication Skills Desire for Improvement  ADL's:  Intact  Cognition:  WNL  Sleep:  Number of Hours: 2   Schizoaffective disorder, bipolar type (HCC) unstable  Will continue today 01/05/17 plan as below except where it is noted.    Treatment Plan Summary:Patient today seen as anxious, sleep issues, has restlessness and racing thoughts. Will continue to need medication readjustment. Daily contact with patient to assess and evaluate symptoms and progress in treatment, Medication management and Plan see below  For psychosis: Increase Seroquel XR to 700 mg po qhs. For anxiety sx: Add Neurontin 100 mg po tid. For mood sx: Depakote ER 1250 mg po qhs. Depakote level this AM - therapuetic - Results for Albert [name], Albert [name] (MRN [id number]) as of 01/05/2017 11:48  Ref. Range 01/05/2017 06:20  Valproic Acid,S Latest Ref Range: 50.0 - 100.0 ug/mL 91   For tachycardia: Add Propranolol 10 mg po bid. Labs reviewed - tsh elevated, will get t3, t4. EKG ordered - pending. CSW will work on disposition.   Albert Guarino, MD 01/05/2017, 11:59 AM

## 2017-01-05 NOTE — [facility] Group Notes (Signed)
[facility] Group Notes: (Clinical Social Work)   01/05/2017      Type of Therapy:  Group Therapy   Participation Level:  Did Not Attend despite MHT prompting   Marranda Arakelian Grossman-Orr, LCSW 01/05/2017, 12:11 PM     

## 2017-01-05 NOTE — Progress Notes (Signed)
Patient denies SI, HI and AVH this shift.  Patient reported having racing thoughts and feeling anxious.  Patient reported on incident of vomiting this shift. Patient was noted attending groups, engaged in unit activities and compliant with medications.   Assess patient for safety, offer medications as prescribed, engage patient in 1:1 staff talks.   Patient able to contract for safety.

## 2017-01-05 NOTE — [facility] Group Notes (Signed)
Adult Psychoeducational Group Note  Date:  01/05/2017 Time:  8:00 pm  Group Topic/Focus:  Wrap-Up Group:   The focus of this group is to help patients review their daily goal of treatment and discuss progress on daily workbooks.  Participation Level:  Active  Participation Quality:  Appropriate  Affect:  Appropriate  Cognitive:  Lacking  Insight: Good  Engagement in Group:  Engaged  Modes of Intervention:  Discussion and Education  Additional Comments:  Patient reported talking to MD.   Patient confirmed feeling better.  Patient identified coping skills, such as "feel better," exercising, and "thinking."   [name], Albert [name] N 01/05/2017, 10:14 PM

## 2017-01-05 NOTE — Progress Notes (Signed)
D: Patient anxious and intrusive this shift. Pt vomited his HS meds this evening after eating two ice creams in group. Pt given Zofran and was able to tolerate pills afterwards. A: Encourage staff/peer interaction, medication compliance, and group participation. Administer medications as ordered, maintain Q 15 minute safety checks. R: Pt compliant with medications and attended group session. Pt denies SI at this time and verbally contracts for safety. No signs/symptoms of distress noted.

## 2017-01-06 LAB — HEMOGLOBIN A1C
Hgb A1c MFr Bld: 5.1 % (ref 4.8–5.6)
Mean Plasma Glucose: 100 mg/dL

## 2017-01-06 LAB — T4, FREE: Free T4: 0.73 ng/dL (ref 0.61–1.12)

## 2017-01-06 MED ORDER — PANTOPRAZOLE SODIUM 40 MG PO TBEC
40.0000 mg | DELAYED_RELEASE_TABLET | Freq: Two times a day (BID) | ORAL | Status: DC
  Administered 2017-01-06 – 2017-01-09 (×6): 40 mg via ORAL
  Filled 2017-01-06 (×10): qty 1

## 2017-01-06 MED ORDER — LORAZEPAM 1 MG PO TABS: 2.0000 mg | ORAL_TABLET | Freq: Once | ORAL | Status: AC | PRN

## 2017-01-06 MED ORDER — GABAPENTIN 100 MG PO CAPS
200.0000 mg | ORAL_CAPSULE | ORAL | Status: DC
  Administered 2017-01-06 – 2017-01-08 (×6): 200 mg via ORAL
  Filled 2017-01-06 (×12): qty 2

## 2017-01-06 MED ORDER — LORAZEPAM 2 MG/ML IJ SOLN
2.0000 mg | Freq: Once | INTRAMUSCULAR | Status: AC | PRN
  Administered 2017-01-06: 2 mg via INTRAMUSCULAR
  Filled 2017-01-06: qty 1

## 2017-01-06 MED ORDER — PROPRANOLOL HCL 10 MG PO TABS
10.0000 mg | ORAL_TABLET | Freq: Three times a day (TID) | ORAL | Status: DC
  Administered 2017-01-06 – 2017-01-09 (×9): 10 mg via ORAL
  Filled 2017-01-06 (×14): qty 1

## 2017-01-06 NOTE — Progress Notes (Addendum)
D: Patient watchful of staff, paranoid, wringing his hands, sweating and appears to be responding to internal stimuli. Patient paranoid of medications and needing writer to encourage him several times before he reluctantly took them. Patient states "If I scream at the top of my lungs tonight, what will happen? The voices say I need to sacrifice someone tonight to get this curse off of me. I have no choice but to do it. Patient becoming increasingly agitated and states "[facility] something off of the nurse's desk and ran down the hall and they tied him down. If I grab scissors, what happens then? Will you tie me down?" Patient states he "might need to throw up my pills". Patient instructed to remain in the dayroom for 30 minutes after he took po medications; pt agreed and complied. Pt then stated "I think all of the staff will become psychotic and attack me. The voice said you are all being controlled by him". Barbara CowerJason, NP notified of patient's increasing paranoia and behaviors; PRN Ativan ordered and given IM. Patient placed in the open seclusion room at the nursing station for safety of himself and others. Patient did lay down in the bed without incident, but remains watchful and paranoid of staff.  A: Encourage staff/peer interaction, medication compliance, and group participation. Administer medications as ordered, maintain Q 15 minute safety checks. Open seclusion room for safety. R: Pt remains paranoid and watchful. Charge nurse aware.

## 2017-01-06 NOTE — Progress Notes (Signed)
[facility] Group Notes:  (Nursing/MHT/Case Management/Adjunct)  Date:  01/06/2017  Time:  9:19 PM  Type of Therapy:  Psychoeducational Skills  Participation Level:  Active  Participation Quality:  Attentive  Affect:  Appropriate  Cognitive:  Appropriate  Insight:  Appropriate  Engagement in Group:  Limited  Modes of Intervention:  Education  Summary of Progress/Problems: The patient expressed in group this evening that he had a good talk with his father today. He also shared in group that he is working on addressing his relationship issues. As for the theme of the day, his support system will consist of his father and dog.   Hazle CocaGOODMAN, Maxx Calaway S 01/06/2017, 9:19 PM

## 2017-01-06 NOTE — Progress Notes (Signed)
Patient ID: Albert [name], male   DOB: 05/12/1976, 41 y.o.   MRN: [id number]   D: Pt has been very flat and depressed on the unit today. Pt attended all groups today and engaged in treatment. Pt reported that his depression was a 5, that he was not hopeless, and that he was not having any anxiety. Pt reported that his goal for today was to get long with others. Pt signed a 72 hour request for discharge at 1015am. Pt reported that he was just ready for discharge. Pt reported being negative SI/HI, no AH/VH noted. A: 15 min checks continued for patient safety. R: Pt safety maintained.

## 2017-01-06 NOTE — Progress Notes (Signed)
[facility] MD Progress Note  01/06/2017 1:55 PM Albert [name]  MRN:  [id number] Subjective: Patient states "I still have racing thoughts.'  Objective:Patient seen and chart reviewed.Discussed patient with treatment team.  Pt seen with racing thoughts, reports he continues to need help with same. Pt reports improved sleep. Pt per RN had another episode of vomiting yesterday PM, pt denies any concerns right now. Offered symptomatic treatment. Will continue to treat.     Principal Problem: Schizoaffective disorder, bipolar type (HCC) Diagnosis:   Patient Active Problem List   Diagnosis Date Noted  . Elevated TSH [R94.6] 01/05/2017  . Tachycardia [R00.0] 01/05/2017  . Schizoaffective disorder, bipolar type (HCC) [F25.0] 01/04/2017  . Moderate cannabis use disorder, in early remission (HCC) [F12.21] 01/04/2017  . Alcohol use disorder, moderate, in early remission (HCC) [F10.21] 01/04/2017  . Alcoholism (HCC) [F10.20]   . Drug abuse [F19.10]   . Medical non-compliance [Z91.19]    Total Time spent with patient: 20 minutes  Past Psychiatric History: Please see H&P.   Past Medical History:  Past Medical History:  Diagnosis Date  . Alcoholism (HCC)   . Drug abuse    xanax addiction 5 years ago  . Medical non-compliance   . Paranoid schizophrenia (HCC)   . Seizures (HCC)    History reviewed. No pertinent surgical history. Family History:  Family History  Problem Relation Age of Onset  . Depression Maternal Aunt    Family Psychiatric  History: Please see H&P.  Social History: Please see H&P.  History  Alcohol Use No    Comment: 11 months sober in March 2018     History  Drug Use No    Social History   Social History  . Marital status: Single    Spouse name: N/A  . Number of children: N/A  . Years of education: N/A   Social History Main Topics  . Smoking status: Former Games developer  . Smokeless tobacco: Never Used     Comment: Non-smoker  . Alcohol use No      Comment: 11 months sober in March 2018  . Drug use: No  . Sexual activity: Not Currently   Other Topics Concern  . None   Social History Narrative  . None   Additional Social History:                         Sleep: Fair  Appetite:  Fair  Current Medications: Current Facility-Administered Medications  Medication Dose Route Frequency Provider Last Rate Last Dose  . acetaminophen (TYLENOL) tablet 650 mg  650 mg Oral Q4H PRN Charm Rings, NP      . alum & mag hydroxide-simeth (MAALOX/MYLANTA) 200-200-20 MG/5ML suspension 30 mL  30 mL Oral PRN Charm Rings, NP      . asenapine (SAPHRIS) sublingual tablet 5 mg  5 mg Sublingual BID PRN Jomarie Longs, MD   5 mg at 01/05/17 0818  . benztropine (COGENTIN) tablet 0.5 mg  0.5 mg Oral BID Jomarie Longs, MD   0.5 mg at 01/06/17 0844  . divalproex (DEPAKOTE ER) 24 hr tablet 1,250 mg  1,250 mg Oral QHS Jomarie Longs, MD   1,250 mg at 01/05/17 2216  . gabapentin (NEURONTIN) capsule 200 mg  200 mg Oral [id number] Inayah Woodin, MD      . hydrOXYzine (ATARAX/VISTARIL) tablet 25 mg  25 mg Oral Q6H PRN Jackelyn Poling, NP   25 mg at 01/05/17 2214  . ibuprofen (ADVIL,MOTRIN) tablet  600 mg  600 mg Oral Q8H PRN Charm Rings, NP      . magnesium hydroxide (MILK OF MAGNESIA) suspension 30 mL  30 mL Oral Daily PRN Charm Rings, NP      . ondansetron [facility]) tablet 4 mg  4 mg Oral Q8H PRN Charm Rings, NP      . ondansetron (ZOFRAN-ODT) disintegrating tablet 8 mg  8 mg Oral Q8H PRN Charm Rings, NP   8 mg at 01/05/17 2047  . pantoprazole (PROTONIX) EC tablet 40 mg  40 mg Oral BID AC Song Garris, MD      . propranolol (INDERAL) tablet 10 mg  10 mg Oral TID Jomarie Longs, MD      . QUEtiapine (SEROQUEL XR) 24 hr tablet 700 mg  700 mg Oral QHS Jomarie Longs, MD   700 mg at 01/05/17 2028  . zolpidem (AMBIEN) tablet 5 mg  5 mg Oral QHS PRN Jomarie Longs, MD        Lab Results:  Results for orders placed or performed during the  hospital encounter of 01/03/17 (from the past 48 hour(s))  Valproic acid level     Status: None   Collection Time: 01/05/17  6:20 AM  Result Value Ref Range   Valproic Acid Lvl 91 50.0 - 100.0 ug/mL    Comment: Performed at [facility], [address] [place], [place] [address]  TSH     Status: Abnormal   Collection Time: 01/05/17  6:20 AM  Result Value Ref Range   TSH 7.410 (H) 0.350 - 4.500 uIU/mL    Comment: Performed by a 3rd Generation assay with a functional sensitivity of <=0.01 uIU/mL. Performed at [facility], [address], [place], [place] [address]   Lipid panel     Status: Abnormal   Collection Time: 01/05/17  6:20 AM  Result Value Ref Range   Cholesterol 172 0 - 200 mg/dL   Triglycerides 098 <119 mg/dL   HDL 41 >14 mg/dL   Total CHOL/HDL Ratio 4.2 RATIO   VLDL 22 0 - 40 mg/dL   LDL Cholesterol 782 (H) 0 - 99 mg/dL    Comment:        Total Cholesterol/HDL:CHD Risk Coronary Heart Disease Risk Table                     Men   Women  1/2 Average Risk   3.4   3.3  Average Risk       5.0   4.4  2 X Average Risk   9.6   7.1  3 X Average Risk  23.4   11.0        Use the calculated Patient Ratio above and the CHD Risk Table to determine the patient's CHD Risk.        ATP III CLASSIFICATION (LDL):  <100     mg/dL   Optimal  956-213  mg/dL   Near or Above                    Optimal  130-159  mg/dL   Borderline  086-578  mg/dL   High  >469     mg/dL   Very High Performed at [facility], [address] [place], [place] [address]   Hemoglobin A1c     Status: None   Collection Time: 01/05/17  6:20 AM  Result Value Ref Range   Hgb A1c MFr  Bld 5.1 4.8 - 5.6 %    Comment: (NOTE)         Pre-diabetes: 5.7 - 6.4         Diabetes: >6.4         Glycemic control for adults with diabetes: <7.0    Mean Plasma Glucose 100 mg/dL    Comment: (NOTE) Performed At: [facility] [address] [place], [place]  [id number] Mila Homer MD [id number] Performed at [facility], [address], [place], [place] [address]   T4, free     Status: None   Collection Time: 01/06/17  6:07 AM  Result Value Ref Range   Free T4 0.73 0.61 - 1.12 ng/dL    Comment: (NOTE) Biotin ingestion may interfere with free T4 tests. If the results are inconsistent with the TSH level, previous test results, or the clinical presentation, then consider biotin interference. If needed, order repeat testing after stopping biotin. Performed at [facility], [address] [place], [place] [address]     Blood Alcohol level:  Lab Results  Component Value Date   [facility] <5 01/03/2017   ETH <11 01/22/2013    Metabolic Disorder Labs: Lab Results  Component Value Date   HGBA1C 5.1 01/05/2017   MPG 100 01/05/2017   No results found for: PROLACTIN Lab Results  Component Value Date   CHOL 172 01/05/2017   TRIG 112 01/05/2017   HDL 41 01/05/2017   CHOLHDL 4.2 01/05/2017   VLDL 22 01/05/2017   LDLCALC 109 (H) 01/05/2017    Physical Findings: AIMS: Facial and Oral Movements Muscles of Facial Expression: None, normal Lips and Perioral Area: None, normal Jaw: None, normal Tongue: None, normal,Extremity Movements Upper (arms, wrists, hands, fingers): None, normal Lower (legs, knees, ankles, toes): None, normal, Trunk Movements Neck, shoulders, hips: None, normal, Overall Severity Severity of abnormal movements (highest score from questions above): None, normal Incapacitation due to abnormal movements: None, normal Patient's awareness of abnormal movements (rate only patient's report): No Awareness, Dental Status Current problems with teeth and/or dentures?: No Does patient usually wear dentures?: No  CIWA:    COWS:     Musculoskeletal: Strength & Muscle Tone: within normal limits Gait & Station: normal Patient leans: N/A  Psychiatric Specialty Exam: Physical Exam  Nursing note  and vitals reviewed.   Review of Systems  Psychiatric/Behavioral: Positive for depression. The patient is nervous/anxious.   All other systems reviewed and are negative.   Blood pressure 127/77, pulse (!) 122, temperature 98.2 F (36.8 C), temperature source Oral, resp. rate 18, height 5\' 11"  (1.803 m), weight 99.8 kg (220 lb), SpO2 99 %.Body mass index is 30.68 kg/m.  General Appearance: Casual  Eye Contact:  Fair  Speech:  Normal Rate  Volume:  Normal  Mood:  Anxious  Affect:  Congruent  Thought Process:  Goal Directed and Descriptions of Associations: Circumstantial  Orientation:  Full (Time, Place, and Person)  Thought Content:  Rumination  Suicidal Thoughts:  No  Homicidal Thoughts:  No  Memory:  Immediate;   Fair Recent;   Fair Remote;   Fair  Judgement:  Impaired  Insight:  Shallow  Psychomotor Activity:  Normal  Concentration:  Concentration: Fair and Attention Span: Fair  Recall:  [employer] of Knowledge:  Fair  Language:  Fair  Akathisia:  No  Handed:  Right  AIMS (if indicated):     Assets:  Communication Skills Desire for Improvement  ADL's:  Intact  Cognition:  WNL  Sleep:  Number of Hours: 4.75   Schizoaffective disorder, bipolar type (HCC) unstable  Will continue today 01/06/17  plan as below except where it is noted.      Treatment Plan Summary:Pt today seen as anxious , has racing thoughts , will continue to readjust medications. Pt has signed a 72 hr discharge application on 01/06/2017.  Daily contact with patient to assess and evaluate symptoms and progress in treatment, Medication management and Plan see below  For psychosis: Increase Seroquel XR to 700 mg po qhs. For anxiety sx: Increase Neurontin to 200 mg po tid. For mood sx: Depakote ER 1250 mg po qhs. Depakote level this AM - therapuetic - Results for Eyvonne LeftMCALPINE, Albert (MRN [id number]) as of 01/05/2017 11:48  Ref. Range 01/05/2017 06:20  Valproic Acid,S Latest Ref Range: 50.0 - 100.0  ug/mL 91   For tachycardia: Increase Propranolol to 10 mg po tid. Labs reviewed - tsh elevated, t4 - wnl, pending t3. EKG ordered - pending. CSW will work on disposition.   Ezekiel Menzer, MD 01/06/2017, 1:55 PM

## 2017-01-06 NOTE — [facility] Group Notes (Signed)
[facility] Group Notes: (Clinical Social Work)   01/06/2017      Type of Therapy:  Group Therapy   Participation Level:  Did Not Attend despite MHT prompting   Ambrose MantleMareida Grossman-Orr, LCSW 01/06/2017, 12:00 PM

## 2017-01-06 NOTE — Progress Notes (Signed)
Patient ID: Albert [name], male   DOB: 12/03/1975, 41 y.o.   MRN: [id number]    Pt signed a 72 hour request for discharge on 01/06/17 @ 1015am, Dr. Elna BreslowEappen made aware. Jacquelyne BalintShalita Glenis Musolf RN

## 2017-01-07 DIAGNOSIS — Z87891 Personal history of nicotine dependence: Secondary | ICD-10-CM

## 2017-01-07 LAB — T3: T3, Total: 79 ng/dL (ref 71–180)

## 2017-01-07 NOTE — Progress Notes (Signed)
D: Pt appears to be responding to internal stimuli. Pt wide eyed and watchful. Pt paranoid today and when people are looking at him pt tell them to stop staring at him. Pt endorses AH telling him to fight people and to hurt others. Pt verbalized to MHT that he was having thoughts to tape people eyes closed. Pt able to contract for safety and stated that he wouldn't hurt anyone. Pt thoughts are disorganized. Speech tangential. Pt have delayed responses and thought blocking.  A: Medications reviewed with pt. Medications administered as ordered per MD. Verbal support provided. Pt encouraged to attend groups. 15 minute checks performed for safety. Pt redirect as needed by staff. R: Pt compliant with tx.

## 2017-01-07 NOTE — Progress Notes (Signed)
Patient currently asleep with no signs or symptoms of distress noted. Respirations regular and unlabored. 

## 2017-01-07 NOTE — [facility] Group Notes (Signed)
.  [facility] LCSW Group Therapy  01/07/2017 , 3:37 PM   Type of Therapy:  Group Therapy  Participation Level:  Active  Participation Quality:  Attentive  Affect:  Appropriate  Cognitive:  Alert  Insight:  Improving  Engagement in Therapy:  Engaged  Modes of Intervention:  Discussion, Exploration and Socialization  Summary of Progress/Problems: Today's group focused on the term Diagnosis.  Participants were asked to define the term, and then pronounce whether it is a negative, positive or neutral term.  Stayed the entire time, minimal engagement.  Speech was circumstantial and tangential.  Albert [name], Albert [name] 01/07/2017 , 3:37 PM

## 2017-01-07 NOTE — Progress Notes (Signed)
[facility] Group Notes:  (Nursing/MHT/Case Management/Adjunct)  Date:  01/07/2017  Time:  9:18 PM  Type of Therapy:  Psychoeducational Skills  Participation Level:  Active  Participation Quality:  Appropriate  Affect:  Appropriate  Cognitive:  Appropriate  Insight:  Appropriate  Engagement in Group:  Engaged  Modes of Intervention:  Education  Summary of Progress/Problems: The patient shared in group that he was concerned about his friends that have yet to contact him or visit him in the hospital. On a more positive note, he mentioned that he spent more time talking to his peers and that he wants more people to treat him with respect. He did not explain any further. In terms of the theme for the day, his wellness strategy will be to "take more walks".   Hazle CocaGOODMAN, Aloria Looper S 01/07/2017, 9:18 PM

## 2017-01-07 NOTE — Progress Notes (Signed)
Recreation Therapy Notes  Date: 01/07/17 Time: 1000 Location: 500 Hall Dayroom  Group Topic: Coping Skills  Goal Area(s) Addresses:  Patients will be able to identify positive coping skills. Patients will be able to identify the benefits of using coping skills post d/c.  Intervention: Pencils, blank mindmap  Activity: Coping Skills Mindmap.  Patients were given a blank mindmap.  There were eight boxes to be filled in with various things that cause us to use coping skills.  Patients and LRT filled out these eight boxes together with things such as depression, anxiety, sleeplessness, etc.  Each of these eight boxes has three boxes extending from them.  The patients were to come up with three coping skills for each of the eight symptoms individually.  The group would then come back together and go over the coping skills.  Education: PharmacologistCoping Skills, Building control surveyorDischarge Planning.   Education Outcome: Acknowledges understanding/In group clarification offered/Needs additional education.   Clinical Observations/Feedback: Pt did not attend group.   Caroll RancherMarjette Travion Ke, LRT/CTRS         Lillia AbedLindsay, Karem Tomaso A 01/07/2017 11:59 AM

## 2017-01-07 NOTE — Progress Notes (Signed)
[facility] MD Progress Note  01/07/2017 1:30 PM Albert [name]  MRN:  [id number] Subjective: Patient reports " I am feeling a little better, I was unable to rest on last night my roommates eyes was big and he looked like a psychopath. I was scared."  Objective:Albert [name] seen resting in quite room. Patient reports he was unable to rest well last night.  Denies suicidal or homicidal ideation. Denies auditory or visual hallucination. Patient reports racing thoughts. Patient is tangential with thoughts and appears to be responding to internal stimuli. Patient start talking about going back to [facility].   Patient reports he is medication compliant without mediation side effects. Patient denies depression or depressive symptom. Support, encouragement and reassurance was provided.      Principal Problem: Schizoaffective disorder, bipolar type (HCC) Diagnosis:   Patient Active Problem List   Diagnosis Date Noted  . Elevated TSH [R94.6] 01/05/2017  . Tachycardia [R00.0] 01/05/2017  . Schizoaffective disorder, bipolar type (HCC) [F25.0] 01/04/2017  . Moderate cannabis use disorder, in early remission (HCC) [F12.21] 01/04/2017  . Alcohol use disorder, moderate, in early remission (HCC) [F10.21] 01/04/2017  . Alcoholism (HCC) [F10.20]   . Drug abuse [F19.10]   . Medical non-compliance [Z91.19]    Total Time spent with patient: 20 minutes  Past Psychiatric History: Please see H&P.   Past Medical History:  Past Medical History:  Diagnosis Date  . Alcoholism (HCC)   . Drug abuse    xanax addiction 5 years ago  . Medical non-compliance   . Paranoid schizophrenia (HCC)   . Seizures (HCC)    History reviewed. No pertinent surgical history. Family History:  Family History  Problem Relation Age of Onset  . Depression Maternal Aunt    Family Psychiatric  History: Please see H&P.  Social History: Please see H&P.  History  Alcohol Use No    Comment: 11 months sober in March  2018     History  Drug Use No    Social History   Social History  . Marital status: Single    Spouse name: N/A  . Number of children: N/A  . Years of education: N/A   Social History Main Topics  . Smoking status: Former Games developer  . Smokeless tobacco: Never Used     Comment: Non-smoker  . Alcohol use No     Comment: 11 months sober in March 2018  . Drug use: No  . Sexual activity: Not Currently   Other Topics Concern  . None   Social History Narrative  . None   Additional Social History:                         Sleep: Fair  Appetite:  Fair  Current Medications: Current Facility-Administered Medications  Medication Dose Route Frequency Provider Last Rate Last Dose  . acetaminophen (TYLENOL) tablet 650 mg  650 mg Oral Q4H PRN Charm Rings, NP      . alum & mag hydroxide-simeth (MAALOX/MYLANTA) 200-200-20 MG/5ML suspension 30 mL  30 mL Oral PRN Charm Rings, NP      . asenapine (SAPHRIS) sublingual tablet 5 mg  5 mg Sublingual BID PRN Jomarie Longs, MD   5 mg at 01/05/17 0818  . benztropine (COGENTIN) tablet 0.5 mg  0.5 mg Oral BID Jomarie Longs, MD   0.5 mg at 01/07/17 0830  . divalproex (DEPAKOTE ER) 24 hr tablet 1,250 mg  1,250 mg Oral QHS Jomarie Longs, MD  1,250 mg at 01/06/17 2156  . gabapentin (NEURONTIN) capsule 200 mg  200 mg Oral [id number] Saramma Eappen, MD   200 mg at 01/07/17 0830  . hydrOXYzine (ATARAX/VISTARIL) tablet 25 mg  25 mg Oral Q6H PRN Jackelyn PolingJason A Berry, NP   25 mg at 01/06/17 2156  . ibuprofen (ADVIL,MOTRIN) tablet 600 mg  600 mg Oral Q8H PRN Charm RingsJamison Y Lord, NP      . magnesium hydroxide (MILK OF MAGNESIA) suspension 30 mL  30 mL Oral Daily PRN Charm RingsJamison Y Lord, NP      . ondansetron [facility](ZOFRAN) tablet 4 mg  4 mg Oral Q8H PRN Charm RingsJamison Y Lord, NP      . ondansetron (ZOFRAN-ODT) disintegrating tablet 8 mg  8 mg Oral Q8H PRN Charm RingsJamison Y Lord, NP   8 mg at 01/06/17 1945  . pantoprazole (PROTONIX) EC tablet 40 mg  40 mg Oral BID AC Saramma Eappen,  MD   40 mg at 01/07/17 0830  . propranolol (INDERAL) tablet 10 mg  10 mg Oral TID Jomarie LongsSaramma Eappen, MD   10 mg at 01/07/17 1147  . QUEtiapine (SEROQUEL XR) 24 hr tablet 700 mg  700 mg Oral QHS Saramma Eappen, MD   700 mg at 01/06/17 2155  . zolpidem (AMBIEN) tablet 5 mg  5 mg Oral QHS PRN Jomarie LongsSaramma Eappen, MD        Lab Results:  Results for orders placed or performed during the hospital encounter of 01/03/17 (from the past 48 hour(s))  T3     Status: None   Collection Time: 01/06/17  6:07 AM  Result Value Ref Range   T3, Total 79 71 - 180 ng/dL    Comment: (NOTE) Performed At: [facility] [address] [place], [place] [id number] Mila HomerHancock William F MD WU:[id number]:[phone] Performed at [facility], [address] [place], [place] [address]   T4, free     Status: None   Collection Time: 01/06/17  6:07 AM  Result Value Ref Range   Free T4 0.73 0.61 - 1.12 ng/dL    Comment: (NOTE) Biotin ingestion may interfere with free T4 tests. If the results are inconsistent with the TSH level, previous test results, or the clinical presentation, then consider biotin interference. If needed, order repeat testing after stopping biotin. Performed at [facility], [address] [place], [place] [address]     Blood Alcohol level:  Lab Results  Component Value Date   [facility] 01/03[facility]   ETH <11 01/22/2013    Metabolic Disorder Labs: Lab Results  Component Value Date   HGBA1C 5.1 01/05/2017   MPG 100 01/05/2017   No results found for: PROLACTIN Lab Results  Component Value Date   CHOL 172 01/05/2017   TRIG 112 01/05/2017   HDL 41 01/05/2017   CHOLHDL 4.2 01/05/2017   VLDL 22 01/05/2017   LDLCALC 109 (H) 01/05/2017    Physical Findings: AIMS: Facial and Oral Movements Muscles of Facial Expression: None, normal Lips and Perioral Area: None, normal Jaw: None, normal Tongue: None, normal,Extremity Movements Upper (arms, wrists, hands, fingers): None,  normal Lower (legs, knees, ankles, toes): None, normal, Trunk Movements Neck, shoulders, hips: None, normal, Overall Severity Severity of abnormal movements (highest score from questions above): None, normal Incapacitation due to abnormal movements: None, normal Patient's awareness of abnormal movements (rate only patient's report): No Awareness, Dental Status Current problems with teeth and/or dentures?: No Does patient usually wear dentures?: No  CIWA:    COWS:  Musculoskeletal: Strength & Muscle Tone: within normal limits Gait & Station: normal Patient leans: N/A  Psychiatric Specialty Exam: Physical Exam  Nursing note and vitals reviewed. Constitutional: He is oriented to person, place, and time.  Cardiovascular: Normal rate.   Neurological: He is alert and oriented to person, place, and time.  Psychiatric: He has a normal mood and affect. His behavior is normal.    Review of Systems  Psychiatric/Behavioral: Positive for depression. The patient is nervous/anxious.   All other systems reviewed and are negative.   Blood pressure 125/82, pulse (!) 114, temperature 97.5 F (36.4 C), temperature source Oral, resp. rate 16, height 5\' 11"  (1.803 m), weight 99.8 kg (220 lb), SpO2 99 %.Body mass index is 30.68 kg/m.  General Appearance: Casual and Guarded  Eye Contact:  Fair  Speech:  Normal Rate  Volume:  Normal  Mood:  Anxious  Affect:  Congruent  Thought Process:  Coherent  Orientation:  Full (Time, Place, and Person)  Thought Content:  Delusions, Paranoid Ideation and Rumination  Suicidal Thoughts:  No  Homicidal Thoughts:  No  Memory:  Immediate;   Fair Recent;   Fair Remote;   Fair  Judgement:  Impaired  Insight:  Shallow  Psychomotor Activity:  Normal  Concentration:  Concentration: Fair and Attention Span: Fair  Recall:  [employer] of Knowledge:  Fair  Language:  Fair  Akathisia:  No  Handed:  Right  AIMS (if indicated):     Assets:  Communication  Skills Desire for Improvement  ADL's:  Intact  Cognition:  WNL  Sleep:  Number of Hours: 4.75     I agree with current treatment plan on 01/07/2017, Patient seen face-to-face for psychiatric evaluation follow-up, chart reviewed. Reviewed the information documented and agree with the treatment plan.   Will continue today 01/07/17  plan as below except where it is noted.  Treatment Plan Summary: Daily contact with patient to assess and evaluate symptoms and progress in treatment, Medication management and Plan see below For psychosis: Continue Seroquel XR to 700 mg po qhs. For anxiety sx: Continue Neurontin to 200 mg po tid. For mood sx: Depakote ER 1250 mg po qhs. Depakote level this AM - therapuetic - Results for LAVEL, RIEMAN (MRN [id number]) as of 01/05/2017 11:48  Ref. Range 01/05/2017 06:20  Valproic Acid,S Latest Ref Range: 50.0 - 100.0 ug/mL 91   For tachycardia: continue Propranolol to 10 mg po tid. Labs reviewed - TSH elevated, t4 - wnl, pendint3. Labs reviewed 3/12 Fee t4= 0.73 and T3= 79 WNL  EKG ordered - pending. CSW will work on disposition.   Oneta Rack, NP 01/07/2017, 1:30 PM

## 2017-01-07 NOTE — Progress Notes (Signed)
Patient ID: Albert [name], male   DOB: 03/06/1976, 41 y.o.   MRN: [id number] PER STATE REGULATIONS 482.30  THIS CHART WAS REVIEWED FOR MEDICAL NECESSITY WITH RESPECT TO THE PATIENT'S ADMISSION/ DURATION OF STAY.  NEXT REVIEW DATE: 01/11/2017  Willa RoughJENNIFER JONES Adiba Fargnoli, RN, BSN CASE MANAGER

## 2017-01-07 NOTE — Progress Notes (Signed)
Patient ID: Albert [name], male   DOB: 08/17/1976, 41 y.o.   MRN: [id number] D: Client is visible on the unit, seen in dayroom watching TV, interacting with peers and on the phone. Client reports "I been going to group trying to talk to some people" A: Writer provided emotional support, encouraged client to report any concerns. Medications reviewed, administered as ordered. Client took pills two at a time with much encouragement . Client begin to gag off larger pills, writer commended him for taking other medications "I need to think on something positive like people who want to help you and won't hurt you" Client followed through and took other medications. Writer asked client to go to the day room and sit for at least thirty minutes so he can be observed keeping medications down. Client agreed. Staff will monitor q2615min for safety. R: Client is safe on the unit, attended group. Client kept medications down.

## 2017-01-08 MED ORDER — QUETIAPINE FUMARATE ER 50 MG PO TB24
750.0000 mg | ORAL_TABLET | Freq: Every day | ORAL | Status: DC
  Administered 2017-01-08: 22:00:00 750 mg via ORAL
  Filled 2017-01-08 (×4): qty 1

## 2017-01-08 MED ORDER — GABAPENTIN 300 MG PO CAPS
300.0000 mg | ORAL_CAPSULE | ORAL | Status: DC
  Administered 2017-01-08 – 2017-01-09 (×3): 300 mg via ORAL
  Filled 2017-01-08 (×8): qty 1

## 2017-01-08 NOTE — Progress Notes (Signed)
Patient is upset that this author did not order a breakfast tray for him (the other tech on the hallway brought the patient something to eat). This Chartered loss adjusterauthor apologized to the pt.but he feels that an apology is not sufficient and that sometimes people are "obnoxious" towards him on purpose. In addition, the patient stated that this Thereasa Parkinauthor needs to offer some form of restitution for his error.

## 2017-01-08 NOTE — Progress Notes (Signed)
[facility] MD Progress Note  01/08/2017 3:43 PM Albert [name]  MRN:  [id number] Subjective: Patient states "I am ok, still anxious , but better.'  Objective:Patient seen and chart reviewed.Discussed patient with treatment team.  Pt today seen as anxious , with some paranoia. Pt has signed a 72 hr discharge application and feels he will be ready to go home soon. Pt per RN , continues to be seen as restless, irrelevant at times, requires redirection. Pt continues to need treatment.     Principal Problem: Schizoaffective disorder, bipolar type (HCC) Diagnosis:   Patient Active Problem List   Diagnosis Date Noted  . Elevated TSH [R94.6] 01/05/2017  . Tachycardia [R00.0] 01/05/2017  . Schizoaffective disorder, bipolar type (HCC) [F25.0] 01/04/2017  . Moderate cannabis use disorder, in early remission (HCC) [F12.21] 01/04/2017  . Alcohol use disorder, moderate, in early remission (HCC) [F10.21] 01/04/2017  . Alcoholism (HCC) [F10.20]   . Drug abuse [F19.10]   . Medical non-compliance [Z91.19]    Total Time spent with patient: 20 minutes  Past Psychiatric History: Please see H&P.   Past Medical History:  Past Medical History:  Diagnosis Date  . Alcoholism (HCC)   . Drug abuse    xanax addiction 5 years ago  . Medical non-compliance   . Paranoid schizophrenia (HCC)   . Seizures (HCC)    History reviewed. No pertinent surgical history. Family History:  Family History  Problem Relation Age of Onset  . Depression Maternal Aunt    Family Psychiatric  History: Please see H&P.  Social History: Please see H&P.  History  Alcohol Use No    Comment: 11 months sober in March 2018     History  Drug Use No    Social History   Social History  . Marital status: Single    Spouse name: N/A  . Number of children: N/A  . Years of education: N/A   Social History Main Topics  . Smoking status: Former Games developer  . Smokeless tobacco: Never Used     Comment: Non-smoker  . Alcohol  use No     Comment: 11 months sober in March 2018  . Drug use: No  . Sexual activity: Not Currently   Other Topics Concern  . None   Social History Narrative  . None   Additional Social History:                         Sleep: Fair  Appetite:  Fair  Current Medications: Current Facility-Administered Medications  Medication Dose Route Frequency Provider Last Rate Last Dose  . acetaminophen (TYLENOL) tablet 650 mg  650 mg Oral Q4H PRN Charm Rings, NP      . alum & mag hydroxide-simeth (MAALOX/MYLANTA) 200-200-20 MG/5ML suspension 30 mL  30 mL Oral PRN Charm Rings, NP      . asenapine (SAPHRIS) sublingual tablet 5 mg  5 mg Sublingual BID PRN Jomarie Longs, MD   5 mg at 01/07/17 1454  . benztropine (COGENTIN) tablet 0.5 mg  0.5 mg Oral BID Jomarie Longs, MD   0.5 mg at 01/08/17 0805  . divalproex (DEPAKOTE ER) 24 hr tablet 1,250 mg  1,250 mg Oral QHS Jomarie Longs, MD   1,250 mg at 01/07/17 2119  . gabapentin (NEURONTIN) capsule 300 mg  300 mg Oral [id number] Jamarrion Budai, MD      . hydrOXYzine (ATARAX/VISTARIL) tablet 25 mg  25 mg Oral Q6H PRN Jackelyn Poling, NP  25 mg at 01/06/17 2156  . ibuprofen (ADVIL,MOTRIN) tablet 600 mg  600 mg Oral Q8H PRN Charm Rings, NP      . magnesium hydroxide (MILK OF MAGNESIA) suspension 30 mL  30 mL Oral Daily PRN Charm Rings, NP      . ondansetron [facility]) tablet 4 mg  4 mg Oral Q8H PRN Charm Rings, NP      . ondansetron (ZOFRAN-ODT) disintegrating tablet 8 mg  8 mg Oral Q8H PRN Charm Rings, NP   8 mg at 01/06/17 1945  . pantoprazole (PROTONIX) EC tablet 40 mg  40 mg Oral BID AC Jomarie Longs, MD   40 mg at 01/08/17 0617  . propranolol (INDERAL) tablet 10 mg  10 mg Oral TID Jomarie Longs, MD   10 mg at 01/08/17 1205  . QUEtiapine (SEROQUEL XR) 24 hr tablet 750 mg  750 mg Oral QHS Heaton Sarin, MD      . zolpidem (AMBIEN) tablet 5 mg  5 mg Oral QHS PRN Jomarie Longs, MD        Lab Results:  No results found for  this or any previous visit (from the past 48 hour(s)).  Blood Alcohol level:  Lab Results  Component Value Date   [facility] <5 01/03/2017   ETH <11 01/22/2013    Metabolic Disorder Labs: Lab Results  Component Value Date   HGBA1C 5.1 01/05/2017   MPG 100 01/05/2017   No results found for: PROLACTIN Lab Results  Component Value Date   CHOL 172 01/05/2017   TRIG 112 01/05/2017   HDL 41 01/05/2017   CHOLHDL 4.2 01/05/2017   VLDL 22 01/05/2017   LDLCALC 109 (H) 01/05/2017    Physical Findings: AIMS: Facial and Oral Movements Muscles of Facial Expression: None, normal Lips and Perioral Area: None, normal Jaw: None, normal Tongue: None, normal,Extremity Movements Upper (arms, wrists, hands, fingers): None, normal Lower (legs, knees, ankles, toes): None, normal, Trunk Movements Neck, shoulders, hips: None, normal, Overall Severity Severity of abnormal movements (highest score from questions above): None, normal Incapacitation due to abnormal movements: None, normal Patient's awareness of abnormal movements (rate only patient's report): No Awareness, Dental Status Current problems with teeth and/or dentures?: No Does patient usually wear dentures?: No  CIWA:    COWS:     Musculoskeletal: Strength & Muscle Tone: within normal limits Gait & Station: normal Patient leans: N/A  Psychiatric Specialty Exam: Physical Exam  Nursing note and vitals reviewed.   Review of Systems  Psychiatric/Behavioral: The patient is nervous/anxious.   All other systems reviewed and are negative.   Blood pressure 122/87, pulse 86, temperature 97.5 F (36.4 C), temperature source Oral, resp. rate 16, height 5\' 11"  (1.803 m), weight 99.8 kg (220 lb), SpO2 99 %.Body mass index is 30.68 kg/m.  General Appearance: Casual  Eye Contact:  Fair  Speech:  Normal Rate  Volume:  Normal  Mood:  Anxious  Affect:  Congruent  Thought Process:  Goal Directed and Descriptions of Associations: Circumstantial   Orientation:  Full (Time, Place, and Person)  Thought Content:  Rumination  Suicidal Thoughts:  No  Homicidal Thoughts:  No  Memory:  Immediate;   Fair Recent;   Fair Remote;   Fair  Judgement:  Impaired  Insight:  Shallow  Psychomotor Activity:  Normal  Concentration:  Concentration: Fair and Attention Span: Fair  Recall:  [employer] of Knowledge:  Fair  Language:  Fair  Akathisia:  No  Handed:  Right  AIMS (if indicated):     Assets:  Communication Skills Desire for Improvement  ADL's:  Intact  Cognition:  WNL  Sleep:  Number of Hours: 6   Schizoaffective disorder, bipolar type (HCC) unstable  Will continue today 01/08/17  plan as below except where it is noted.         Treatment Plan Summary:Pt today seen as anxious , improving , continues to need redirection on the unit. Will continue treatment. Pt signed 72 hr discharge application on Sunday - 01/06/17.  Daily contact with patient to assess and evaluate symptoms and progress in treatment, Medication management and Plan see below  For psychosis: Increase Seroquel XR to 750 mg po qhs. For anxiety sx: Increase Neurontin to 300 mg pi tid. For mood sx: Depakote ER 1250 mg po qhs. Depakote level this AM - therapuetic - Results for Albert [name], Albert [name] (MRN [id number]) as of 01/05/2017 11:48  Ref. Range 01/05/2017 06:20  Valproic Acid,S Latest Ref Range: 50.0 - 100.0 ug/mL 91   For tachycardia: Increase Propranolol to 10 mg po tid. Labs reviewed - tsh elevated, t3, t4- wnl. EKG ordered - pending. CSW will work on disposition.   Gedalya Jim, MD 01/08/2017, 3:43 PM

## 2017-01-08 NOTE — Progress Notes (Signed)
D: Pt received at nurse's station early this am speaking in a loud pressed voice. Pt was upset about not having a breakfast tray as he is preparing his breakfast cereal. Pt verbalized that he feels people are trying to upset him on  Purpose. Later at the med window pt verbalized to this nurse that he is sometimes angry about one thing but takes his anger out on others. Pt rated his depression 3/10. He denied SI/HI/AVH.  A: Safety checks maintained. Encouraged pt to be more aware of displacing his anger towards others. Advised that this takes practice and that as long as he can recognize when he is projecting his anger, he will become more empowered to help it. Pt praised for his efforts at trying to change his behavior.  R: Pt received praise well. Verbalized no additional concerns. Contracted for safety.

## 2017-01-08 NOTE — Progress Notes (Signed)
Recreation Therapy Notes  Date: 01/08/17 Time: 1000 Location: 500 Hall Dayroom  Group Topic: Communication  Goal Area(s) Addresses:  Patient will effectively communicate with peers in group.  Patient will verbalize benefit of healthy communication. Patient will verbalize positive effect of healthy communication on post d/c goals.  Patient will identify communication techniques that made activity effective for group.   Behavioral Response: Engaged  Intervention:  Chairs, stopwatch, 3 small beach balls   Activity: Group Juggle.  Patients were placed in a circle.  Patients were given one small beach ball to toss back and forth between each other.  After some time, LRT would add another ball to the mix.  Patients were to now keep two balls moving within the circle.  After more time had passed, a third ball was added to the mix.  The balls could be bounced off the floor but could not come to a stop.  While the patients are tossing the balls within the circle, LRT is timing how long it takes for the balls to come to a complete stop.  If the balls come to a stop, LRT resets the the.  Education: Communication, Discharge Planning  Education Outcome: Acknowledges understanding/In group clarification offered/Needs additional education.   Clinical Observations/Feedback: Pt was very active and engaged in group.  Pt worked well with peers to figure out how to keep the balls going at the same time.  Pt seemed brighter and focused in group.    Caroll RancherMarjette Kimara Bencomo, LRT/CTRS         Caroll RancherLindsay, Revia Nghiem A 01/08/2017 11:46 AM

## 2017-01-08 NOTE — Plan of Care (Signed)
Problem: Medication: Goal: Compliance with prescribed medication regimen will improve Outcome: Progressing Compliance with prescribed medications regimen will improve AEB medication review and client taking medications without incidence.

## 2017-01-08 NOTE — Progress Notes (Signed)
[facility] Group Notes:  (Nursing/MHT/Case Management/Adjunct)  Date:  01/08/2017  Time:  9:30 PM  Type of Therapy:  Psychoeducational Skills  Participation Level:  Active  Participation Quality:  Appropriate  Affect:  Appropriate  Cognitive:  Appropriate  Insight:  Improving  Engagement in Group:  Improving  Modes of Intervention:  Education  Summary of Progress/Problems: Patient states that he had a good day overall and that he had a good talk with his mother. The patient's goal for tomorrow is to speak with his doctor about his discharge plans.   Albert [name] S 01/08/2017, 9:30 PM

## 2017-01-08 NOTE — [facility] Group Notes (Signed)
[facility] LCSW Group Therapy  01/08/2017 1:15 pm  Type of Therapy: Process Group Therapy  Participation Level:  Active  Participation Quality:  Appropriate  Affect:  Flat  Cognitive:  Oriented  Insight:  Improving  Engagement in Group:  Limited  Engagement in Therapy:  Limited  Modes of Intervention:  Activity, Clarification, Education, Problem-solving and Support  Summary of Progress/Problems: Today's group addressed the issue of overcoming obstacles.  Patients were asked to identify their biggest obstacle post d/c that stands in the way of their on-going success, and then problem solve as to how to manage this.Stayed the entire time, engaged throughout.  Unable to identify any obstacles.  Guarded.  Albert [name], Albert [name] 01/08/2017   12:41 PM

## 2017-01-08 NOTE — Progress Notes (Signed)
Patient ID: Albert [name], male   DOB: 04/06/1976, 41 y.o.   MRN: [id number] D: Client visible on the unit, in dayroom watching TV interacting with peers, also seen on the phone. "I hear things in my head" Client continues to focus on a therapist(Terry A.). Report that "TA group talked about making new friends, but the voices say don't make friends" "Its all confusing" "I was talking to my sister on the phone, she said I have to move on a make friends without TA" A: Writer provided emotional support, encouraged client to think on positive things and report any harmful thoughts. Medications reviewed, administered as ordered. Staff will monitor q8115min for safety. R: Client is safe on the unit, attended group.

## 2017-01-09 LAB — PROLACTIN
Prolactin: 13.1 ng/mL
Prolactin: 30.4 ng/mL — ABNORMAL HIGH (ref 4.0–15.2)

## 2017-01-09 MED ORDER — PANTOPRAZOLE SODIUM 40 MG PO TBEC: 40.0000 mg | DELAYED_RELEASE_TABLET | Freq: Two times a day (BID) | ORAL | 0 refills | Status: DC

## 2017-01-09 MED ORDER — DIVALPROEX SODIUM ER 250 MG PO TB24: 1250.0000 mg | ORAL_TABLET | Freq: Every day | ORAL | 0 refills | Status: DC

## 2017-01-09 MED ORDER — PROPRANOLOL HCL 10 MG PO TABS: 10.0000 mg | ORAL_TABLET | Freq: Three times a day (TID) | ORAL | 0 refills | Status: DC

## 2017-01-09 MED ORDER — GABAPENTIN 300 MG PO CAPS: 300.0000 mg | ORAL_CAPSULE | ORAL | 0 refills | Status: DC

## 2017-01-09 MED ORDER — ZOLPIDEM TARTRATE 5 MG PO TABS: 5.0000 mg | ORAL_TABLET | Freq: Every evening | ORAL | 0 refills | Status: DC | PRN

## 2017-01-09 MED ORDER — HYDROXYZINE HCL 25 MG PO TABS: 25.0000 mg | ORAL_TABLET | Freq: Four times a day (QID) | ORAL | 0 refills | Status: DC | PRN

## 2017-01-09 MED ORDER — BENZTROPINE MESYLATE 0.5 MG PO TABS: 0.5000 mg | ORAL_TABLET | Freq: Two times a day (BID) | ORAL | 0 refills | Status: DC

## 2017-01-09 MED ORDER — QUETIAPINE FUMARATE ER 150 MG PO TB24: 750.0000 mg | ORAL_TABLET | Freq: Every day | ORAL | 0 refills | Status: DC

## 2017-01-09 NOTE — [facility] Suicide Risk Assessment (Signed)
[name]   Principal Problem: Schizoaffective disorder, bipolar type [facility](HCC) Discharge Diagnoses:  Patient Active Problem List   Diagnosis Date Noted  . Elevated TSH [R94.6] 01/05/2017  . Tachycardia [R00.0] 01/05/2017  . Schizoaffective disorder, bipolar type (HCC) [F25.0] 01/04/2017  . Moderate cannabis use disorder, in early remission (HCC) [F12.21] 01/04/2017  . Alcohol use disorder, moderate, in early remission (HCC) [F10.21] 01/04/2017  . Alcoholism (HCC) [F10.20]   . Drug abuse [F19.10]   . Medical non-compliance [Z91.19]     Total Time spent with patient: 30 minutes  Musculoskeletal: Strength & Muscle Tone: within normal limits Gait & Station: normal Patient leans: N/A  Psychiatric Specialty Exam: Review of Systems  Psychiatric/Behavioral: Negative for depression, hallucinations and suicidal ideas.  All other systems reviewed and are negative.   Blood pressure 115/66, pulse 77, temperature 97.5 F (36.4 C), temperature source Oral, resp. rate 18, height 5\' 11"  (1.803 m), weight 99.8 kg (220 lb), SpO2 99 %.Body mass index is 30.68 kg/m.  General Appearance: Casual  Eye Contact::  Fair  Speech:  Normal Rate409  Volume:  Normal  Mood:  Anxious  Affect:  Appropriate  Thought Process:  Goal Directed and Descriptions of Associations: Intact  Orientation:  Full (Time, Place, and Person)  Thought Content:  Logical  Suicidal Thoughts:  No  Homicidal Thoughts:  No  Memory:  Immediate;   Fair Recent;   Fair Remote;   Fair  Judgement:  Fair  Insight:  Fair  Psychomotor Activity:  Normal  Concentration:  Fair  Recall:  [employer]Fair  Fund of Knowledge:Fair  Language: Fair  Akathisia:  No  Handed:  Right  AIMS (if indicated):   denies tremors, involuntary movements  Assets:  Communication Skills Desire for Improvement  Sleep:  Number of Hours: 5.75  Cognition: WNL  ADL's:  Intact   Mental Status Per Nursing Assessment::   On Admission:    (Denies SI/HI at present time)  Demographic Factors:  Male and Caucasian  Loss Factors: NA  Historical Factors: Impulsivity  Risk Reduction Factors:   Positive social support and Positive therapeutic relationship  Continued Clinical Symptoms:  Previous Psychiatric Diagnoses and Treatments  Cognitive Features That Contribute To Risk:  None    Suicide Risk:  Minimal: No identifiable suicidal ideation.  Patients presenting with no risk factors but with morbid ruminations; may be classified as minimal risk based on the severity of the depressive symptoms    Plan Of Care/Follow-up recommendations:  Activity:  no restrictions Diet:  regular Tests:  as needed Other:  follow up with outpatient provider for depakote level as per their recommendations  Sefora Tietje, MD 01/09/2017, 9:57 AM

## 2017-01-09 NOTE — Progress Notes (Signed)
  [facility]  Will you be returning to the same living situation after discharge:  Yes,  home At discharge, do you have transportation home?: Yes,  family Do you have the ability to pay for your medications: Yes,  MCD  Release of information consent forms completed and in the chart;  Patient's signature needed at discharge.  Patient to Follow up at: Follow-up Information    [facility]        [facility] Follow up on 02/07/2017.   Specialty:  Behavioral Health Why:  Thursday at 2:10 with Dr Lise Auereddy Contact information: [address] [place] [address] [phone]        [facility] Follow up.   Why:  Someone from the ACT team will call you in a day or two to set up an appointment so you can see if it is a good fit. Contact information: [address]  [place] [phone]          Next level of care provider [facility]:no  Safety Planning and Suicide Prevention discussed: Yes,  yes  Have you used any form of tobacco in the last 30 days? (Cigarettes, Smokeless Tobacco, Cigars, and/or Pipes): No  Has patient been referred to the Quitline?: N/A patient is not a smoker  Patient has been referred for addiction treatment: N/A  Ida RogueRodney B Jovonda Selner 01/09/2017, 1:03 PM

## 2017-01-09 NOTE — Tx Team (Signed)
Interdisciplinary Treatment and Diagnostic Plan Update  01/09/2017 Time of Session: 1:02 PM  Albert [name] MRN: [id number]  Principal Diagnosis: Schizoaffective disorder, bipolar type [facility])  Secondary Diagnoses: Principal Problem:   Schizoaffective disorder, bipolar type ([place]) Active Problems:   Moderate cannabis use disorder, in early remission (HCC)   Alcohol use disorder, moderate, in early remission (HCC)   Elevated TSH   Tachycardia   Current Medications:  Current Facility-Administered Medications  Medication Dose Route Frequency Provider Last Rate Last Dose  . acetaminophen (TYLENOL) tablet 650 mg  650 mg Oral Q4H PRN Patrecia Pour, NP      . alum & mag hydroxide-simeth (MAALOX/MYLANTA) 200-200-20 MG/5ML suspension 30 mL  30 mL Oral PRN Patrecia Pour, NP      . asenapine (SAPHRIS) sublingual tablet 5 mg  5 mg Sublingual BID PRN Ursula Alert, MD   5 mg at 01/07/17 1454  . benztropine (COGENTIN) tablet 0.5 mg  0.5 mg Oral BID Ursula Alert, MD   0.5 mg at 01/09/17 0813  . divalproex (DEPAKOTE ER) 24 hr tablet 1,250 mg  1,250 mg Oral QHS Ursula Alert, MD   1,250 mg at 01/08/17 2159  . gabapentin (NEURONTIN) capsule 300 mg  300 mg Oral [id number] Saramma Eappen, MD   300 mg at 01/09/17 0813  . hydrOXYzine (ATARAX/VISTARIL) tablet 25 mg  25 mg Oral Q6H PRN Rozetta Nunnery, NP   25 mg at 01/08/17 1647  . ibuprofen (ADVIL,MOTRIN) tablet 600 mg  600 mg Oral Q8H PRN Patrecia Pour, NP      . magnesium hydroxide (MILK OF MAGNESIA) suspension 30 mL  30 mL Oral Daily PRN Patrecia Pour, NP      . ondansetron [facility]) tablet 4 mg  4 mg Oral Q8H PRN Patrecia Pour, NP      . ondansetron (ZOFRAN-ODT) disintegrating tablet 8 mg  8 mg Oral Q8H PRN Patrecia Pour, NP   8 mg at 01/06/17 1945  . pantoprazole (PROTONIX) EC tablet 40 mg  40 mg Oral BID AC Ursula Alert, MD   40 mg at 01/09/17 0620  . propranolol (INDERAL) tablet 10 mg  10 mg Oral TID Ursula Alert, MD   10 mg at 01/09/17  0848  . QUEtiapine (SEROQUEL XR) 24 hr tablet 750 mg  750 mg Oral QHS Saramma Eappen, MD   750 mg at 01/08/17 2200  . zolpidem (AMBIEN) tablet 5 mg  5 mg Oral QHS PRN Ursula Alert, MD        PTA Medications: Prescriptions Prior to Admission  Medication Sig Dispense Refill Last Dose  . benztropine (COGENTIN) 2 MG tablet Take 1 tablet (2 mg total) by mouth 2 (two) times daily. (Patient not taking: Reported on 01/03/2017) 60 tablet 0 Not Taking at Unknown time  . divalproex (DEPAKOTE ER) 500 MG 24 hr tablet Take 3 tablets (1,500 mg total) by mouth at bedtime. For mood stability. 90 tablet 0 01/02/2017 at Unknown time  . ondansetron (ZOFRAN-ODT) 8 MG disintegrating tablet Take 1 tablet (8 mg total) by mouth every 8 (eight) hours as needed. For nausea and vomiting (Patient not taking: Reported on 01/03/2017) 20 tablet 0 Not Taking at Unknown time  . pantoprazole (PROTONIX) 40 MG tablet Take 2 tablets (80 mg total) by mouth daily. For acid reflux (Patient not taking: Reported on 01/03/2017)   Not Taking at Unknown time  . QUEtiapine (SEROQUEL XR) 300 MG 24 hr tablet Take 2 tablets (600 mg total) by mouth  daily. For bipolar mania. 60 tablet 0 01/02/2017 at Unknown time    Treatment Modalities: Medication Management, Group therapy, Case management,  1 to 1 session with clinician, Psychoeducation, Recreational therapy.   Physician Treatment Plan for Primary Diagnosis: Schizoaffective disorder, bipolar type ([place]) Long Term Goal(s): Improvement in symptoms so as ready for discharge  Short Term Goals: Compliance with prescribed medications will improve  Medication Management: Evaluate patient's response, side effects, and tolerance of medication regimen.  Therapeutic Interventions: 1 to 1 sessions, Unit Group sessions and Medication administration.  Evaluation of Outcomes: Adequate for Discharge  Physician Treatment Plan for Secondary Diagnosis: Principal Problem:   Schizoaffective disorder, bipolar type  ([place]) Active Problems:   Moderate cannabis use disorder, in early remission (HCC)   Alcohol use disorder, moderate, in early remission (HCC)   Elevated TSH   Tachycardia   Long Term Goal(s): Improvement in symptoms so as ready for discharge  Short Term Goals: Ability to identify changes in lifestyle to reduce recurrence of condition will improve    Medication Management: Evaluate patient's response, side effects, and tolerance of medication regimen.  Therapeutic Interventions: 1 to 1 sessions, Unit Group sessions and Medication administration.  Evaluation of Outcomes: Adequate for Discharge   RN Treatment Plan for Primary Diagnosis: Schizoaffective disorder, bipolar type ([place]) Long Term Goal(s): Knowledge of disease and therapeutic regimen to maintain health will improve  Short Term Goals: Ability to identify and develop effective coping behaviors will improve and Compliance with prescribed medications will improve  Medication Management: RN will administer medications as ordered by provider, will assess and evaluate patient's response and provide education to patient for prescribed medication. RN will report any adverse and/or side effects to prescribing provider.  Therapeutic Interventions: 1 on 1 counseling sessions, Psychoeducation, Medication administration, Evaluate responses to treatment, Monitor vital signs and CBGs as ordered, Perform/monitor CIWA, COWS, AIMS and Fall Risk screenings as ordered, Perform wound care treatments as ordered.  Evaluation of Outcomes: Adequate for Discharge   LCSW Treatment Plan for Primary Diagnosis: Schizoaffective disorder, bipolar type ([place]) Long Term Goal(s): Safe transition to appropriate next level of care at discharge, Engage patient in therapeutic group addressing interpersonal concerns.  Short Term Goals: Engage patient in aftercare planning with referrals and resources  Therapeutic Interventions: Assess for all discharge needs, 1 to  1 time with Social worker, Explore available resources and support systems, Assess for adequacy in community support network, Educate family and significant other(s) on suicide prevention, Complete Psychosocial Assessment, Interpersonal group therapy.  Evaluation of Outcomes: Met  Return home, follow up [facility] Treatment: Attending groups: Yes Participating in groups: Yes Taking medication as prescribed: Yes Toleration medication: Yes, no side effects reported at this time Family/Significant other contact made: No Patient understands diagnosis: Yes AEB asking for help getting back on usual dose of meds Discussing patient identified problems/goals with staff: Yes Medical problems stabilized or resolved: Yes Denies suicidal/homicidal ideation: Yes Issues/concerns per patient self-inventory: None Other: N/A  New problem(s) identified: None identified at this time.   New Short Term/Long Term Goal(s): None identified at this time.   Discharge Plan or Barriers:   Reason for Continuation of Hospitalization:   Estimated Length of Stay: D/C today  Attendees: Patient: 01/09/2017  1:02 PM  Physician: Ursula Alert, MD 01/09/2017  1:02 PM  Nursing: Hoy Register, RN 01/09/2017  1:02 PM  RN Care Manager: Lars Pinks, RN 01/09/2017  1:02 PM  Social Worker: [name] Fraise 01/09/2017  1:02 PM  Recreational Therapist: Marjette  01/09/2017  1:02 PM  Other: Norberto Sorenson 01/09/2017  1:02 PM  Other:  01/09/2017  1:02 PM    Scribe for Treatment Team:  Roque Lias LCSW 01/09/2017 1:02 PM

## 2017-01-09 NOTE — Progress Notes (Signed)
Nursing Discharge Note 01/09/2017 [id number]  Data Reports sleeping well. Did not complete self-inventory sheet.  Affect blunted but appropriate, reports some anxiety.  Denies HI, SI, AVH.  Patient still very fixated on "Coralyn Mark" throughout the day.  Attending groups.  Reports feeling ready to discharge.  Received discharge orders.  Action Spoke with patient 1:1, nurse offered support to patient throughout shift.  Reviewed medications, discharge instructions, and follow up appointments with patient. Medication scripts reviewed and given to patient.  Paperwork, AVS, SRA, and transition record handed to patient.   Escorted off of unit at 1545. Belongings returned per belongings form.  Discharged to lobby where he was met by family.    Response Verbalized understanding of discharge teaching. Agrees to contact someone or 911 with thoughts/intent to harm self or others.    To follow up per AVS.

## 2017-01-09 NOTE — Discharge Summary (Signed)
Physician Discharge Summary Note  Patient:  Albert [name] is an 41 y.o., male MRN:  [id number] DOB:  [birth year] Patient phone:  [phone] (home)  Patient address:   [address] [place] [address],  Total Time spent with patient: 30 minutes  Date of Admission:  01/03/2017 Date of Discharge: 01/09/2017  Reason for Admission:  Worsening hallucinations  Principal Problem: Schizoaffective disorder, bipolar type [facility]) Discharge Diagnoses: Patient Active Problem List   Diagnosis Date Noted  . Elevated TSH [R94.6] 01/05/2017  . Tachycardia [R00.0] 01/05/2017  . Schizoaffective disorder, bipolar type (HCC) [F25.0] 01/04/2017  . Moderate cannabis use disorder, in early remission (HCC) [F12.21] 01/04/2017  . Alcohol use disorder, moderate, in early remission (HCC) [F10.21] 01/04/2017  . Alcoholism (HCC) [F10.20]   . Drug abuse [F19.10]   . Medical non-compliance [Z91.19]    Past Psychiatric History: see HPI  Past Medical History:  Past Medical History:  Diagnosis Date  . Alcoholism (HCC)   . Drug abuse    xanax addiction 5 years ago  . Medical non-compliance   . Paranoid schizophrenia (HCC)   . Seizures (HCC)    History reviewed. No pertinent surgical history. Family History:  Family History  Problem Relation Age of Onset  . Depression Maternal Aunt    Family Psychiatric  History: see HPI Social History:  History  Alcohol Use No    Comment: 11 months sober in March 2018     History  Drug Use No    Social History   Social History  . Marital status: Single    Spouse name: N/A  . Number of children: N/A  . Years of education: N/A   Social History Main Topics  . Smoking status: Former Games developer  . Smokeless tobacco: Never Used     Comment: Non-smoker  . Alcohol use No     Comment: 11 months sober in March 2018  . Drug use: No  . Sexual activity: Not Currently   Other Topics Concern  . None   Social History Narrative  . None    Hospital  Course:  Alesandro Stueve is a 46 y old CM, who is single, who lives in [place], who has a hx of psychosis, mood sx, who presented with worsening AH.  Kasper Mudrick was admitted for Schizoaffective disorder, bipolar type [facility]) and crisis management.  Patient was treated with medications with their indications listed below in detail under Medication List.  Medical problems were identified and treated as needed.  Home medications were restarted as appropriate.  Improvement was monitored by observation and [name] daily report of symptom reduction.  Emotional and mental status was monitored by daily self inventory reports completed by [name] and clinical staff.  Patient reported continued improvement, denied any new concerns.  Patient had been compliant on medications and denied side effects.  Support and encouragement was provided.         August Longest was evaluated by the treatment team for stability and plans for continued recovery upon discharge.  Patient was offered further treatment options upon discharge including Residential, Intensive Outpatient and Outpatient treatment. Patient will follow up with agency listed below for medication management and counseling.  Encouraged patient to maintain satisfactory support network and home environment.  Advised to adhere to medication compliance and outpatient treatment follow up.  Prescriptions provided.       Briceson Broadwater motivation was an integral factor for scheduling further treatment.  Employment, transportation, bed availability, health status, family support, and  any pending legal issues were also considered during patient's hospital stay.  Upon completion of this admission the patient was both mentally and medically stable for discharge denying suicidal/homicidal ideation, auditory/visual/tactile hallucinations, delusional thoughts and paranoia.       Physical Findings: AIMS: Facial and Oral  Movements Muscles of Facial Expression: None, normal Lips and Perioral Area: None, normal Jaw: None, normal Tongue: None, normal,Extremity Movements Upper (arms, wrists, hands, fingers): None, normal Lower (legs, knees, ankles, toes): None, normal, Trunk Movements Neck, shoulders, hips: None, normal, Overall Severity Severity of abnormal movements (highest score from questions above): None, normal Incapacitation due to abnormal movements: None, normal Patient's awareness of abnormal movements (rate only patient's report): No Awareness, Dental Status Current problems with teeth and/or dentures?: No Does patient usually wear dentures?: No  CIWA:    COWS:     Musculoskeletal: Strength & Muscle Tone: within normal limits Gait & Station: normal Patient leans: N/A  Psychiatric Specialty Exam:  SEE MD SRA Physical Exam  Nursing note and vitals reviewed.   ROS  Blood pressure 115/66, pulse 77, temperature 97.5 F (36.4 C), temperature source Oral, resp. rate 18, height 5\' 11"  (1.803 m), weight 99.8 kg (220 lb), SpO2 99 %.Body mass index is 30.68 kg/m.    Have you used any form of tobacco in the last 30 days? (Cigarettes, Smokeless Tobacco, Cigars, and/or Pipes): No  Has this patient used any form of tobacco in the last 30 days? (Cigarettes, Smokeless Tobacco, Cigars, and/or Pipes) Yes, N/A  Blood Alcohol level:  Lab Results  Component Value Date   [facility] <5 01/03/2017   ETH <11 01/22/2013    Metabolic Disorder Labs:  Lab Results  Component Value Date   HGBA1C 5.1 01/05/2017   MPG 100 01/05/2017   Lab Results  Component Value Date   PROLACTIN 30.4 (H) 01/06/2017   PROLACTIN 13.1 01/05/2017   Lab Results  Component Value Date   CHOL 172 01/05/2017   TRIG 112 01/05/2017   HDL 41 01/05/2017   CHOLHDL 4.2 01/05/2017   VLDL 22 01/05/2017   LDLCALC 109 (H) 01/05/2017    See Psychiatric Specialty Exam and Suicide Risk Assessment completed by Attending Physician prior to  discharge.  Discharge destination:  Home  Is patient on multiple antipsychotic therapies at discharge:  No   Has Patient had three or more failed trials of antipsychotic monotherapy by history:  No  Recommended Plan for Multiple Antipsychotic Therapies: NA   Allergies as of 01/09/2017   No Known Allergies     Medication List    STOP taking these medications   ondansetron 8 MG disintegrating tablet Commonly known as:  ZOFRAN-ODT     TAKE these medications     Indication  benztropine 0.5 MG tablet Commonly known as:  COGENTIN Take 1 tablet (0.5 mg total) by mouth 2 (two) times daily. What changed:  medication strength  how much to take  Indication:  Extrapyramidal Reaction caused by Medications   divalproex 250 MG 24 hr tablet Commonly known as:  DEPAKOTE ER Take 5 tablets (1,250 mg total) by mouth at bedtime. What changed:  medication strength  how much to take  additional instructions  Indication:  Manic Phase of Manic-Depression   gabapentin 300 MG capsule Commonly known as:  NEURONTIN Take 1 capsule (300 mg total) by mouth 3 (three) times daily at 8am, 3pm and bedtime.  Indication:  Agitation, Neuropathic Pain   hydrOXYzine 25 MG tablet Commonly known as:  ATARAX/VISTARIL Take 1  tablet (25 mg total) by mouth every 6 (six) hours as needed for anxiety.  Indication:  Anxiety Neurosis   pantoprazole 40 MG tablet Commonly known as:  PROTONIX Take 1 tablet (40 mg total) by mouth 2 (two) times daily before a meal. What changed:  how much to take  when to take this  additional instructions  Indication:  Excess Stomach Secretions   propranolol 10 MG tablet Commonly known as:  INDERAL Take 1 tablet (10 mg total) by mouth 3 (three) times daily.  Indication:  Anxiety Related to Current Life Problems   QUEtiapine Fumarate 150 MG 24 hr tablet Commonly known as:  SEROQUEL XR Take 5 tablets (750 mg total) by mouth at bedtime. What changed:  medication  strength  how much to take  when to take this  additional instructions  Indication:  Manic-Depression   zolpidem 5 MG tablet Commonly known as:  AMBIEN Take 1 tablet (5 mg total) by mouth at bedtime as needed for sleep.  Indication:  Trouble Sleeping       Follow-up recommendations:  Activity:  as tol Diet:  as tol  Comments:  1.  Take all your medications as prescribed.   2.  Report any adverse side effects to outpatient provider. 3.  Patient instructed to not use alcohol or illegal drugs while on prescription medicines. 4.  In the event of worsening symptoms, instructed patient to call 911, the crisis hotline or go to nearest emergency room for evaluation of symptoms.  Signed: Lindwood QuaSheila May Blaze Sandin, NP [facility] 01/09/2017, 10:35 AM

## 2017-01-09 NOTE — Plan of Care (Signed)
[facility] Re[facility] Therapeutic Interventions Goal: STG-Patient will identify at least five coping skills for ** STG: Coping Skills - Patient will be able to identify at least 5 coping skills for hearing and seeing things by conclusion of recreation therapy tx  Outcome: Adequate for Discharge Pt was able to identify a some coping skills at completion of recreation therapy sessions.  Caroll RancherMarjette Jakub Debold, LRT/CTRS

## 2017-01-09 NOTE — Progress Notes (Signed)
Recreation Therapy Notes  Date: 01/09/17 Time: 1000 Location: 500 Hall Dayroom  Group Topic: Self-Esteem  Goal Area(s) Addresses:  Patient will identify positive ways to increase self-esteem. Patient will verbalize benefit of increased self-esteem.  Intervention: Blank crest, colored pencils  Activity: Crest of Arms.  Patients were given a blank crest divided into four sections.  In each section, patients were to identify something positive about themselves.  Patients could choose from categories such as biggest accomplishment, proudest moment, best feature, etc or come up their own category.  Patients were to draw a picture to give their words a visual.  Education:  Self-Esteem, Discharge Planning.   Education Outcome: Acknowledges education/In group clarification offered/Needs additional education  Clinical Observations/Feedback: Pt did not attend group.    Caroll RancherMarjette Donnie Panik, LRT/CTRS         Caroll RancherLindsay, Liviah Cake A 01/09/2017 12:56 PM

## 2017-01-28 ENCOUNTER — Encounter (HOSPITAL_COMMUNITY): Payer: Self-pay | Admitting: Emergency Medicine

## 2017-01-28 ENCOUNTER — Ambulatory Visit (HOSPITAL_COMMUNITY)
Admission: EM | Admit: 2017-01-28 | Discharge: 2017-01-28 | Disposition: A | Discharge status: Home / Self Care | Payer: Medicare Other | Attending: Family Medicine | Admitting: Family Medicine

## 2017-01-28 DIAGNOSIS — H65111 Acute and subacute allergic otitis media (mucoid) (sanguinous) (serous), right ear: Secondary | ICD-10-CM | POA: Diagnosis not present

## 2017-01-28 MED ORDER — AMOXICILLIN 875 MG PO TABS: 875.0000 mg | ORAL_TABLET | Freq: Two times a day (BID) | ORAL | 0 refills | Status: DC

## 2017-01-28 NOTE — ED Triage Notes (Signed)
The patient presented to the [facility] with a complaint of right ear pain x 2 weeks. 

## 2017-01-28 NOTE — ED Provider Notes (Signed)
[facility]    CSN: [id number] Arrival date & time: 01/28/17  1624     History   Chief Complaint Chief Complaint  Patient presents with  . Otalgia    HPI Albert [name] is a 41 y.o. male.   The patient presented to the [facility] with a complaint of right ear pain x 2 weeks.  He's experienced no trauma to that ear. He was a smoker but stopped. He's taken no medicine for this.  Patient says he has slight dizziness when he gets up quickly in the morning. He also has some muffled or decreased hearing on that side.      Past Medical History:  Diagnosis Date  . Alcoholism (HCC)   . Drug abuse    xanax addiction 5 years ago  . Medical non-compliance   . Paranoid schizophrenia (HCC)   . Seizures [facility])     Patient Active Problem List   Diagnosis Date Noted  . Elevated TSH 01/05/2017  . Tachycardia 01/05/2017  . Schizoaffective disorder, bipolar type (HCC) 01/04/2017  . Moderate cannabis use disorder, in early remission (HCC) 01/04/2017  . Alcohol use disorder, moderate, in early remission (HCC) 01/04/2017  . Alcoholism (HCC)   . Drug abuse   . Medical non-compliance     History reviewed. No pertinent surgical history.     Home Medications    Prior to Admission medications   Medication Sig Start Date End Date Taking? Authorizing Provider  amoxicillin (AMOXIL) 875 MG tablet Take 1 tablet (875 mg total) by mouth 2 (two) times daily. 01/28/17   Elvina Sidle, MD  benztropine (COGENTIN) 0.5 MG tablet Take 1 tablet (0.5 mg total) by mouth 2 (two) times daily. 01/09/17   Adonis Brook, NP  divalproex (DEPAKOTE ER) 250 MG 24 hr tablet Take 5 tablets (1,250 mg total) by mouth at bedtime. 01/09/17   Adonis Brook, NP  gabapentin (NEURONTIN) 300 MG capsule Take 1 capsule (300 mg total) by mouth 3 (three) times daily at 8am, 3pm and bedtime. 01/09/17   Adonis Brook, NP  hydrOXYzine (ATARAX/VISTARIL) 25 MG tablet Take 1 tablet (25 mg total) by mouth every 6 (six)  hours as needed for anxiety. 01/09/17   Adonis Brook, NP  pantoprazole (PROTONIX) 40 MG tablet Take 1 tablet (40 mg total) by mouth 2 (two) times daily before a meal. 01/09/17   Adonis Brook, NP  propranolol (INDERAL) 10 MG tablet Take 1 tablet (10 mg total) by mouth 3 (three) times daily. 01/09/17   Adonis Brook, NP  QUEtiapine (SEROQUEL XR) 150 MG 24 hr tablet Take 5 tablets (750 mg total) by mouth at bedtime. 01/09/17   Adonis Brook, NP  zolpidem (AMBIEN) 5 MG tablet Take 1 tablet (5 mg total) by mouth at bedtime as needed for sleep. 01/09/17   Adonis Brook, NP    Family History Family History  Problem Relation Age of Onset  . Depression Maternal Aunt     Social History Social History  Substance Use Topics  . Smoking status: Former Games developer  . Smokeless tobacco: Never Used     Comment: Non-smoker  . Alcohol use No     Comment: 11 months sober in March 2018     Allergies   Patient has no known allergies.   Review of Systems Review of Systems  Constitutional: Negative.   HENT: Positive for ear pain and hearing loss.   Neurological: Positive for dizziness.  All other systems reviewed and are negative.    Physical Exam  Triage Vital Signs ED Triage Vitals  Enc Vitals Group     BP 01/28/17 1633 (!) 143/79     Pulse Rate 01/28/17 1633 (!) 117     Resp 01/28/17 1633 20     Temp 01/28/17 1633 98.7 F (37.1 C)     Temp Source 01/28/17 1633 Oral     SpO2 01/28/17 1633 96 %     Weight --      Height --      Head Circumference --      Peak Flow --      Pain Score 01/28/17 1632 4     Pain Loc --      Pain Edu? --      Excl. in GC? --    No data found.   Updated Vital Signs BP (!) 143/79 (BP Location: Right Arm)   Pulse (!) 117   Temp 98.7 F (37.1 C) (Oral)   Resp 20   SpO2 96%    Physical Exam  Constitutional: He is oriented to person, place, and time. He appears well-developed and well-nourished.  HENT:  Right Ear: External ear normal.  Left Ear:  External ear normal.  Mouth/Throat: Oropharynx is clear and moist.  The anterior half of the right TM is retracted with white fluid behind the tympanic membrane  Eyes: Conjunctivae and EOM are normal. Pupils are equal, round, and reactive to light.  Neck: Normal range of motion. Neck supple.  Pulmonary/Chest: Effort normal.  Musculoskeletal: Normal range of motion.  Neurological: He is oriented to person, place, and time.  Skin: Skin is warm and dry.  Nursing note and vitals reviewed.    UC Treatments / Results  Labs (all labs ordered are listed, but only abnormal results are displayed) Labs Reviewed - No data to display  EKG  EKG Interpretation None       Radiology No results found.  Procedures Procedures (including critical care time)  Medications Ordered in UC Medications - No data to display   Initial Impression / Assessment and Plan / UC Course  I have reviewed the triage vital signs and the nursing notes.  Pertinent labs & imaging results that were available during my care of the patient were reviewed by me and considered in my medical decision making (see chart for details).     Final Clinical Impressions(s) / UC Diagnoses   Final diagnoses:  Acute mucoid otitis media of right ear    New Prescriptions New Prescriptions   AMOXICILLIN (AMOXIL) 875 MG TABLET    Take 1 tablet (875 mg total) by mouth 2 (two) times daily.     Elvina Sidle, MD 01/28/17 [id number]

## 2017-07-04 ENCOUNTER — Ambulatory Visit (HOSPITAL_COMMUNITY)
Admission: EM | Admit: 2017-07-04 | Discharge: 2017-07-04 | Disposition: A | Discharge status: Home / Self Care | Payer: Medicare Other | Source: Home / Self Care | Attending: Psychiatry | Admitting: Psychiatry

## 2017-07-04 ENCOUNTER — Emergency Department (HOSPITAL_COMMUNITY)
Admission: EM | Admit: 2017-07-04 | Discharge: 2017-07-05 | Disposition: A | Discharge status: Home / Self Care | Payer: Medicare Other | Attending: Emergency Medicine | Admitting: Emergency Medicine

## 2017-07-04 ENCOUNTER — Encounter (HOSPITAL_COMMUNITY): Payer: Self-pay | Admitting: Family Medicine

## 2017-07-04 DIAGNOSIS — Z046 Encounter for general psychiatric examination, requested by authority: Secondary | ICD-10-CM | POA: Diagnosis not present

## 2017-07-04 DIAGNOSIS — F200 Paranoid schizophrenia: Secondary | ICD-10-CM

## 2017-07-04 DIAGNOSIS — F23 Brief psychotic disorder: Secondary | ICD-10-CM | POA: Diagnosis not present

## 2017-07-04 DIAGNOSIS — Z79899 Other long term (current) drug therapy: Secondary | ICD-10-CM | POA: Insufficient documentation

## 2017-07-04 DIAGNOSIS — F22 Delusional disorders: Secondary | ICD-10-CM | POA: Diagnosis present

## 2017-07-04 DIAGNOSIS — Z818 Family history of other mental and behavioral disorders: Secondary | ICD-10-CM | POA: Diagnosis not present

## 2017-07-04 DIAGNOSIS — Z9114 Patient's other noncompliance with medication regimen: Secondary | ICD-10-CM | POA: Diagnosis not present

## 2017-07-04 DIAGNOSIS — R45851 Suicidal ideations: Secondary | ICD-10-CM | POA: Diagnosis not present

## 2017-07-04 DIAGNOSIS — F250 Schizoaffective disorder, bipolar type: Secondary | ICD-10-CM | POA: Diagnosis present

## 2017-07-04 DIAGNOSIS — Z87891 Personal history of nicotine dependence: Secondary | ICD-10-CM | POA: Diagnosis not present

## 2017-07-04 DIAGNOSIS — F419 Anxiety disorder, unspecified: Secondary | ICD-10-CM | POA: Diagnosis not present

## 2017-07-04 LAB — COMPREHENSIVE METABOLIC PANEL
ALK PHOS: 55 U/L (ref 38–126)
ALT: 23 U/L (ref 17–63)
AST: 24 U/L (ref 15–41)
Albumin: 4.7 g/dL (ref 3.5–5.0)
Anion gap: 11 (ref 5–15)
BILIRUBIN TOTAL: 0.5 mg/dL (ref 0.3–1.2)
BUN: 9 mg/dL (ref 6–20)
CALCIUM: 9.4 mg/dL (ref 8.9–10.3)
CO2: 26 mmol/L (ref 22–32)
CREATININE: 0.82 mg/dL (ref 0.61–1.24)
Chloride: 104 mmol/L (ref 101–111)
Glucose, Bld: 111 mg/dL — ABNORMAL HIGH (ref 65–99)
Potassium: 3.6 mmol/L (ref 3.5–5.1)
Sodium: 141 mmol/L (ref 135–145)
Total Protein: 7.8 g/dL (ref 6.5–8.1)

## 2017-07-04 LAB — RAPID URINE DRUG SCREEN, HOSP PERFORMED
AMPHETAMINES: NOT DETECTED
BARBITURATES: NOT DETECTED
Benzodiazepines: NOT DETECTED
Cocaine: NOT DETECTED
Opiates: NOT DETECTED
TETRAHYDROCANNABINOL: NOT DETECTED

## 2017-07-04 LAB — CBC
HCT: 44.5 % (ref 39.0–52.0)
HEMOGLOBIN: 15.7 g/dL (ref 13.0–17.0)
MCH: 33.1 pg (ref 26.0–34.0)
MCHC: 35.3 g/dL (ref 30.0–36.0)
MCV: 93.9 fL (ref 78.0–100.0)
PLATELETS: 266 10*3/uL (ref 150–400)
RBC: 4.74 MIL/uL (ref 4.22–5.81)
RDW: 13.1 % (ref 11.5–15.5)
WBC: 7.1 10*3/uL (ref 4.0–10.5)

## 2017-07-04 LAB — ETHANOL

## 2017-07-04 LAB — SALICYLATE LEVEL

## 2017-07-04 LAB — ACETAMINOPHEN LEVEL: Acetaminophen (Tylenol), Serum: <10 ug/mL — ABNORMAL LOW (ref 10–30)

## 2017-07-04 MED ORDER — ZOLPIDEM TARTRATE 5 MG PO TABS: 5.0000 mg | ORAL_TABLET | Freq: Every evening | ORAL | Status: DC | PRN

## 2017-07-04 MED ORDER — PANTOPRAZOLE SODIUM 40 MG PO TBEC
40.0000 mg | DELAYED_RELEASE_TABLET | Freq: Two times a day (BID) | ORAL | Status: DC
  Administered 2017-07-04 – 2017-07-05 (×2): 40 mg via ORAL
  Filled 2017-07-04 (×2): qty 1

## 2017-07-04 MED ORDER — QUETIAPINE FUMARATE 100 MG PO TABS
200.0000 mg | ORAL_TABLET | Freq: Every day | ORAL | Status: DC
  Administered 2017-07-04: 200 mg via ORAL
  Filled 2017-07-04: qty 2

## 2017-07-04 MED ORDER — ZIPRASIDONE MESYLATE 20 MG IM SOLR
20.0000 mg | INTRAMUSCULAR | Status: AC | PRN
  Administered 2017-07-04: 20 mg via INTRAMUSCULAR
  Filled 2017-07-04: qty 20

## 2017-07-04 MED ORDER — DIVALPROEX SODIUM ER 500 MG PO TB24: 1500.0000 mg | ORAL_TABLET | Freq: Every day | ORAL | Status: DC

## 2017-07-04 MED ORDER — GABAPENTIN 300 MG PO CAPS: 300.0000 mg | ORAL_CAPSULE | ORAL | Status: DC

## 2017-07-04 MED ORDER — ONDANSETRON HCL 4 MG PO TABS: 4.0000 mg | ORAL_TABLET | Freq: Three times a day (TID) | ORAL | Status: DC | PRN

## 2017-07-04 MED ORDER — HYDROXYZINE HCL 25 MG PO TABS
25.0000 mg | ORAL_TABLET | Freq: Four times a day (QID) | ORAL | Status: DC | PRN
  Administered 2017-07-04: 25 mg via ORAL
  Filled 2017-07-04: qty 1

## 2017-07-04 MED ORDER — RISPERIDONE 1 MG PO TBDP
2.0000 mg | ORAL_TABLET | Freq: Three times a day (TID) | ORAL | Status: DC | PRN
  Administered 2017-07-05: 2 mg via ORAL
  Filled 2017-07-04 (×2): qty 2

## 2017-07-04 MED ORDER — DIPHENHYDRAMINE HCL 50 MG/ML IJ SOLN: 25.0000 mg | Freq: Once | INTRAMUSCULAR | Status: DC | PRN

## 2017-07-04 MED ORDER — LORAZEPAM 1 MG PO TABS: 1.0000 mg | ORAL_TABLET | ORAL | Status: DC | PRN

## 2017-07-04 MED ORDER — QUETIAPINE FUMARATE 50 MG PO TABS
50.0000 mg | ORAL_TABLET | Freq: Once | ORAL | Status: AC
  Administered 2017-07-04: 50 mg via ORAL
  Filled 2017-07-04: qty 1

## 2017-07-04 MED ORDER — ACETAMINOPHEN 325 MG PO TABS
650.0000 mg | ORAL_TABLET | ORAL | Status: DC | PRN
  Administered 2017-07-04: 650 mg via ORAL
  Filled 2017-07-04: qty 2

## 2017-07-04 MED ORDER — GABAPENTIN 300 MG PO CAPS
300.0000 mg | ORAL_CAPSULE | Freq: Two times a day (BID) | ORAL | Status: DC
  Administered 2017-07-04 – 2017-07-05 (×3): 300 mg via ORAL
  Filled 2017-07-04 (×3): qty 1

## 2017-07-04 NOTE — [facility] Counselor (Signed)
Per Donell SievertSpencer Simon, PA-C: Patient recommended for Inpatient treatment.  Writer [facility], RN and confirmed no bed availability.  [facility]  Writer spoke with Charge RN Lyla SonCarrie to provide information about Patient.

## 2017-07-04 NOTE — BH Assessment (Signed)
Assessment Note  Albert [name] is an 41 y.o.single male, who voluntarily came into [facility], with a friend.    Patient reported being "paranoid" and concerned about his health.  Patient stated that he's had suicidal ideations, with no plan. Patient reported having auditory hallucinations consisting of a voice, "Aurther Loft", telling him to "shift" things.   Patient stated that he feeling that the voice is teasing him.   Patient reported experiencing visual hallucinations of someone named, "[name]."  Per Patient's friend, Weston Brass, Patient reported seeing black and golden stars, while sitting in the waiting area, that where not there.  Patient denies homicidal ideations or having access to weapons. Patient reported having a previous history of cutting, but reported the last occurrence was in 2000.  Patient reported ongoing experiences with depressive symptoms, such as despondency, fatigue, isolation, tearfulness, feelings of worthlessness, anger, and recurrent thoughts of being locked up.    Patient reported currently residing alone and receiving disability assistance. Patient identified recent stressors as his "thoughts." Patient reported no arrests, probation/parole, upcoming court dates.  Patient reported not history of physical, sexual, verbal abuse.  Patient reported no family history of suicide and substance abuse.  Per medical records, Patient has received inpatient treatment at [facility] in 2018, 2014, 2013, and 2011.Patient reported currently seeing a Psychiatrist, Dr. Betti Cruz, and therapist, Rodney Cruise.  Patient reported receiving services at [facility] approximately 1 week ago.   Patient stated that he is currently been prescribed medication of his anxiety, but not taking it as prescribed, due to wanting to "cry."  During assessment, Patient was anxious.  Patient was dressed in personal clothing, however appeared to be disheveled.  Patient was oriented to time, person, and place.  Patient was not oriented to  the situation. Patient's eye contact was poor.  Patient's motor activity consisted of restlessness and agitation. Patient's speech was incoherent and tangential.  Patient's level of consciousness was combative.  Patient's mood and affect appeared to be anxious, apprehensive, and preoccupied.  Patient's thought process was tangential.  Patient's judgment appeared to be impaired.  Patient refused to remain in one area and reported wanting another individual to come with him and his friend also.  Patient left designated area and stood in the hallway for several minutes, requesting that the friend come with him.       Diagnosis: Paranoid Schizophrenia, per medical history  Past Medical History:  Past Medical History:  Diagnosis Date  . Alcoholism (HCC)   . Drug abuse    xanax addiction 5 years ago  . Medical non-compliance   . Paranoid schizophrenia (HCC)   . Seizures (HCC)     No past surgical history on file.  Family History:  Family History  Problem Relation Age of Onset  . Depression Maternal Aunt     Social History:  reports that he has quit smoking. He has never used smokeless tobacco. He reports that he does not drink alcohol or use drugs.  Additional Social History:  Alcohol / Drug Use Pain Medications: See MAR Prescriptions: See MAR Over the Counter: See MAR History of alcohol / drug use?: No history of alcohol / drug abuse Longest period of sobriety (when/how long): N/A  CIWA:   COWS:    Allergies: No Known Allergies  Home Medications:  (Not in a hospital admission)  OB/GYN Status:  No LMP for male patient.  General Assessment Data Location of Assessment: [facility] Assessment Services TTS Assessment: In system Is this a Tele or Face-to-Face Assessment?:  Face-to-Face Is this an Initial Assessment or a Re-assessment for this encounter?: Initial Assessment Marital status: Single Is patient pregnant?: No Pregnancy Status: No Living Arrangements: Alone Can pt return to  current living arrangement?: Yes Admission Status: Voluntary Is patient capable of signing voluntary admission?: Yes Referral Source: Self/Family/Friend Insurance type: Medicare  Medical Screening Exam [facility] Walk-in ONLY) Medical Exam completed: Yes  Crisis Care Plan Living Arrangements: Alone Legal Guardian: Other: (Self) Name of Psychiatrist: Dr. Betti Cruzeddy Name of Therapist: Rodney CruiseBob Myland  Education Status Is patient currently in school?: No Current Grade: N/A Name of school: N/A Contact person: N/A  Risk to self with the past 6 months Suicidal Ideation: Yes-Currently Present Has patient been a risk to self within the past 6 months prior to admission? : No Suicidal Intent: Yes-Currently Present Has patient had any suicidal intent within the past 6 months prior to admission? : No Is patient at risk for suicide?: Yes Suicidal Plan?: No (Patient denies) Has patient had any suicidal plan within the past 6 months prior to admission? : No Access to Means: No What has been your use of drugs/alcohol within the last 12 months?: Patient denies. Previous Attempts/Gestures: Yes Other Self Harm Risks: Patient denies. Intentional Self Injurious Behavior: None, Cutting (Pt. reports last occurrence in the year 2000.) Family Suicide History: Unknown Recent stressful life event(s):  (Patient reported none) Persecutory voices/beliefs?: Yes Depression: Yes Depression Symptoms: Fatigue, Isolating, Feeling worthless/self pity, Feeling angry/irritable Substance abuse history and/or treatment for substance abuse?: No Suicide prevention information given to non-admitted patients: Not applicable  Risk to Others within the past 6 months Homicidal Ideation: No Does patient have any lifetime risk of violence toward others beyond the six months prior to admission? : No Thoughts of Harm to Others: No Current Homicidal Intent: No Current Homicidal Plan: No Access to Homicidal Means: No Identified Victim:  Patient denies. History of harm to others?: No Violent Behavior Description: Patient denies. Does patient have access to weapons?: No Criminal Charges Pending?: No Does patient have a court date: No Is patient on probation?: No  Psychosis Hallucinations: Auditory, With command, Visual Delusions: Persecutory  Mental Status Report Appearance/Hygiene: Disheveled, Unremarkable Eye Contact: Poor Motor Activity: Restlessness, Agitation Speech: Incoherent, Tangential Level of Consciousness: Combative Mood: Anxious, Apprehensive, Preoccupied Affect: Appropriate to circumstance, Preoccupied Anxiety Level: Severe Thought Processes: Tangential Judgement: Impaired Orientation: Person, Place, Time Obsessive Compulsive Thoughts/Behaviors: Moderate  Cognitive Functioning Concentration: Poor Memory: Remote Intact, Recent Impaired IQ: Average Insight: Poor Impulse Control: Poor Appetite: Poor Weight Loss:  (Pt. reported loss, but unsure of amount.) Weight Gain: 0 Sleep: Increased Total Hours of Sleep: 12 Vegetative Symptoms: None  ADLScreening [facility] Assessment Services) Patient's cognitive ability adequate to safely complete daily activities?: Yes Patient able to express need for assistance with ADLs?: Yes Independently performs ADLs?: Yes (appropriate for developmental age)  Prior Inpatient Therapy Prior Inpatient Therapy: Yes Prior Therapy Dates: Various Prior Therapy Facilty/Provider(s): [facility] Reason for Treatment: Psychosis  Prior Outpatient Therapy Prior Outpatient Therapy: Yes Prior Therapy Dates: Ongoing Prior Therapy Facilty/Provider(s): Various Reason for Treatment: Psychosis Does patient have an ACCT team?: No Does patient have Intensive In-House Services?  : No Does patient have [facility] services? : Yes Does patient have P4CC services?: No  ADL Screening (condition at time of admission) Patient's cognitive ability adequate to safely complete daily activities?:  Yes Is the patient deaf or have difficulty hearing?: No Does the patient have difficulty seeing, even when wearing glasses/contacts?: No Does the patient have difficulty  concentrating, remembering, or making decisions?: No Patient able to express need for assistance with ADLs?: Yes Does the patient have difficulty dressing or bathing?: No Independently performs ADLs?: Yes (appropriate for developmental age) Does the patient have difficulty walking or climbing stairs?: No Weakness of Legs: None Weakness of Arms/Hands: None  Home Assistive Devices/Equipment Home Assistive Devices/Equipment: None    Abuse/Neglect Assessment (Assessment to be complete while patient is alone) Physical Abuse: Denies Verbal Abuse: Denies Sexual Abuse: Denies Exploitation of patient/patient's resources: Denies Self-Neglect: Denies     Merchant navy officer (For Healthcare) Does Patient Have a Medical Advance Directive?: No Would patient like information on creating a medical advance directive?: No - Patient declined    Additional Information 1:1 In Past 12 Months?: No CIRT Risk: Yes Elopement Risk: Yes Does patient have medical clearance?: Yes     Disposition:  Disposition Initial Assessment Completed for this Encounter: Yes Disposition of Patient: Inpatient treatment program (Per Donell Sievert, PA-C) Type of inpatient treatment program: Adult  On Site Evaluation by:   Reviewed with Physician:    Talbert Nan 07/04/2017 3:07 AM

## 2017-07-04 NOTE — Progress Notes (Signed)
07/04/17 1351:  LRT introduced self to pt in hallway.  Pt was pleasant but slow to respond.  Pt asked activities did we have and LRT told pt what activities were available.  Pt stated he wanted to participate.  LRT and pt played checkers and UNO in the dayroom.  Pt was quiet but pleasant.  LRT had to continuously remind pt which pieces were his during checkers.  Pt later requested word searches when we were finished with the board games.   Albert RancherMarjette Kennethia [name], LRT/CTRS

## 2017-07-04 NOTE — ED Notes (Signed)
Bed: WTR5 Expected date:  Expected time:  Means of arrival:  Comments: 

## 2017-07-04 NOTE — ED Notes (Signed)
Pt reported to this writer that he is seeing strange things and feeling agitated and strange. He requested some medication. This Clinical research associatewriter gave him his medication options and he preferred the Geodon IM. It was given in his right deltoid.

## 2017-07-04 NOTE — ED Notes (Signed)
Bed: WBH39 Expected date:  Expected time:  Means of arrival:  Comments: Hall B 

## 2017-07-04 NOTE — ED Notes (Signed)
Patient denies SI, HI and VH. Patient endorses AH. Plan of care discussed. Encouragement and support provided and safety maintain. Q 15 min safety checks and video monitoring.

## 2017-07-04 NOTE — ED Notes (Signed)
SBAR Report received from previous nurse. Pt received anxious but visible on unit. Pt gave no verbal response to assessment questions but shook his head with the no answers to assessment questions related to  current SI, V H, depression, anxiety, or pain at this time,gave no head motions about SI, AH, anxiety and pain but  appears otherwise stable and free of distress. Pt reminded of camera surveillance, q 15 min rounds, and rules of the milieu. Will continue to assess.

## 2017-07-04 NOTE — ED Provider Notes (Signed)
[facility]-EMERGENCY DEPT Provider Note   CSN: [id number] Arrival date & time: 07/04/17  0246     History   Chief Complaint Chief Complaint  Patient presents with  . Psychiatric Evaluation    HPI Albert [name] is a 41 y.o. male.  HPI Pt with hx of polysubstance abuse, paranoid schizophrenia comes in with cc of paranoia.  Pt reports that he stopped his meds recently and started hearing voices. Pt has no SI/HI. [facility] and sent to the ER for medical clearance.  Pt denies nausea, emesis, fevers, chills, chest pains, shortness of breath, headaches, abdominal pain, uti like symptoms.  Past Medical History:  Diagnosis Date  . Alcoholism (HCC)   . Drug abuse    xanax addiction 5 years ago  . Medical non-compliance   . Paranoid schizophrenia (HCC)   . Seizures [facility](HCC)     Patient Active Problem List   Diagnosis Date Noted  . Elevated TSH 01/05/2017  . Tachycardia 01/05/2017  . Schizoaffective disorder, bipolar type (HCC) 01/04/2017  . Moderate cannabis use disorder, in early remission (HCC) 01/04/2017  . Alcohol use disorder, moderate, in early remission (HCC) 01/04/2017  . Alcoholism (HCC)   . Drug abuse   . Medical non-compliance     History reviewed. No pertinent surgical history.     Home Medications    Prior to Admission medications   Medication Sig Start Date End Date Taking? Authorizing Provider  divalproex (DEPAKOTE ER) 500 MG 24 hr tablet Take 1,500 mg by mouth at bedtime. 05/22/17  Yes [provider]  QUEtiapine (SEROQUEL) 200 MG tablet Take 1 tablet by mouth at bedtime. 05/29/17  Yes [provider]  amoxicillin (AMOXIL) 875 MG tablet Take 1 tablet (875 mg total) by mouth 2 (two) times daily. Patient not taking: Reported on 07/04/2017 01/28/17   Elvina SidleLauenstein, Kurt, MD  benztropine (COGENTIN) 0.5 MG tablet Take 1 tablet (0.5 mg total) by mouth 2 (two) times daily. Patient not taking: Reported on 07/04/2017 01/09/17   Adonis BrookAgustin, Sheila, NP    divalproex (DEPAKOTE ER) 250 MG 24 hr tablet Take 5 tablets (1,250 mg total) by mouth at bedtime. Patient not taking: Reported on 07/04/2017 01/09/17   Adonis BrookAgustin, Sheila, NP  gabapentin (NEURONTIN) 300 MG capsule Take 1 capsule (300 mg total) by mouth 3 (three) times daily at 8am, 3pm and bedtime. Patient not taking: Reported on 07/04/2017 01/09/17   Adonis BrookAgustin, Sheila, NP  hydrOXYzine (ATARAX/VISTARIL) 25 MG tablet Take 1 tablet (25 mg total) by mouth every 6 (six) hours as needed for anxiety. Patient not taking: Reported on 07/04/2017 01/09/17   Adonis BrookAgustin, Sheila, NP  pantoprazole (PROTONIX) 40 MG tablet Take 1 tablet (40 mg total) by mouth 2 (two) times daily before a meal. Patient not taking: Reported on 07/04/2017 01/09/17   Adonis BrookAgustin, Sheila, NP  propranolol (INDERAL) 10 MG tablet Take 1 tablet (10 mg total) by mouth 3 (three) times daily. Patient not taking: Reported on 07/04/2017 01/09/17   Adonis BrookAgustin, Sheila, NP  QUEtiapine (SEROQUEL XR) 150 MG 24 hr tablet Take 5 tablets (750 mg total) by mouth at bedtime. Patient not taking: Reported on 07/04/2017 01/09/17   Adonis BrookAgustin, Sheila, NP  zolpidem (AMBIEN) 5 MG tablet Take 1 tablet (5 mg total) by mouth at bedtime as needed for sleep. Patient not taking: Reported on 07/04/2017 01/09/17   Adonis BrookAgustin, Sheila, NP    Family History Family History  Problem Relation Age of Onset  . Depression Maternal Aunt     Social  History Social History  Substance Use Topics  . Smoking status: Former Games developer  . Smokeless tobacco: Never Used     Comment: Non-smoker  . Alcohol use No     Comment: 11 months sober in March 2018     Allergies   Patient has no known allergies.   Review of Systems Review of Systems  Constitutional: Positive for activity change.  Psychiatric/Behavioral: Positive for hallucinations.  All other systems reviewed and are negative.    Physical Exam Updated Vital Signs BP 136/84 (BP Location: Right Arm)   Pulse 92   Temp (!) 97.4 F (36.3 C) (Oral)    Resp 18   Ht  (1.778 m)   Wt 99.8 kg (220 lb)   SpO2 97%   BMI 31.57 kg/m   Physical Exam  Constitutional: He is oriented to person, place, and time. He appears well-developed.  HENT:  Head: Atraumatic.  Neck: Neck supple.  Cardiovascular: Normal rate.   Pulmonary/Chest: Effort normal.  Neurological: He is alert and oriented to person, place, and time.  Skin: Skin is warm.  Psychiatric:  Thought content abnormal  Nursing note and vitals reviewed.    ED Treatments / Results  Labs (all labs ordered are listed, but only abnormal results are displayed) Labs Reviewed  COMPREHENSIVE METABOLIC PANEL - Abnormal; Notable for the following:       Result Value   Glucose, Bld 111 (*)    All other components within normal limits  ACETAMINOPHEN LEVEL - Abnormal; Notable for the following:    Acetaminophen (Tylenol), Serum <10 (*)    All other components within normal limits  ETHANOL  SALICYLATE LEVEL  CBC  RAPID URINE DRUG SCREEN, HOSP PERFORMED    EKG  EKG Interpretation None       Radiology No results found.  Procedures Procedures (including critical care time)  Medications Ordered in ED Medications  risperiDONE (RISPERDAL M-TABS) disintegrating tablet 2 mg (not administered)    And  LORazepam (ATIVAN) tablet 1 mg (not administered)    And  ziprasidone (GEODON) injection 20 mg (not administered)  acetaminophen (TYLENOL) tablet 650 mg (not administered)  ondansetron (ZOFRAN) tablet 4 mg (not administered)  zolpidem (AMBIEN) tablet 5 mg (not administered)     Initial Impression / Assessment and Plan / ED Course  I have reviewed the triage vital signs and the nursing notes.  Pertinent labs & imaging results that were available during my care of the patient were reviewed by me and considered in my medical decision making (see chart for details).      Pt is medically cleared for [facility] admission. We will not restart the psych meds - as psych will round on  the patient in 2 hours.  Final Clinical Impressions(s) / ED Diagnoses   Final diagnoses:  Acute psychosis    New Prescriptions New Prescriptions   No medications on file     Derwood Kaplan, MD 07/04/17 [phone]

## 2017-07-04 NOTE — ED Notes (Signed)
Introduced self to patient. Pt oriented to unit expectations.  Assessed pt for:  A) Anxiety &/or agitation: Pt appears to be anxious, but is not acting agitated. He refused offer of Risperidone PRN. He took Neurontin as ordered, but when asked how it was making him feel he said, "I think I threw it up." This writer did observe him take the medication. He stands near the nurses station and stares.   S) Safety: Safety maintained with q-15-minute checks and hourly rounds by staff.  A) ADLs: Pt able to perform ADLs independently.  P) Pick-Up (room cleanliness): Pt's room clean and free of clutter.

## 2017-07-04 NOTE — ED Triage Notes (Signed)
Patient is experiencing hallucinations of a gentleman that he knows and reports he is hearing voices. Patient denies being suicidal or homicidal. [facility]. Conclusion from consult is to have inpatient treatment and he will be waiting a bed assignment. Patient is calm, cooperative, but questions himself about his hallucinations.

## 2017-07-04 NOTE — ED Notes (Signed)
He came back without any patient belongings, Baruch GoutyRn Rashell called up to triage and spoke with Mardene CelesteJoanna and she said he did have some belongings and that they will make sure the belongings came back. His belongings is not on unit I will let day shift MHT Tiffany made aware.

## 2017-07-04 NOTE — ED Notes (Signed)
Pt did throw up in his room, mostly in the trash can. He denies nausea at this time.

## 2017-07-04 NOTE — H&P (Signed)
Behavioral Health Medical Screening Exam  Albert [name] is an 41 y.o. male accompanied by his friends with prior diagnosis of Bipolar-Schizoaffective type, who is managed per Dr Betti Cruzeddy at [facility] but hasn't been seen in some time, not has he been compliant with his medications that include Seroquel and Depakote. He is actively hallucinating , endorsing AH with command and moderate degree of paranoia. He doesn't feel safe but isn't endorsing SI with plan, intent, means or HI. He is denying any acute ailments and or chronic co-morbid conditions.  Total Time spent with patient: 30 minutes  Psychiatric Specialty Exam: Physical Exam  Constitutional: He is oriented to person, place, and time. He appears well-developed and well-nourished. No distress.  HENT:  Head: Normocephalic.  Eyes: Pupils are equal, round, and reactive to light.  Respiratory: Effort normal and breath sounds normal. No respiratory distress.  Neurological: He is alert and oriented to person, place, and time. No cranial nerve deficit.  Skin: Skin is warm and dry. He is not diaphoretic.  Psychiatric: His mood appears anxious. His speech is rapid and/or pressured. He is agitated and actively hallucinating. Thought content is paranoid. Cognition and memory are impaired. He expresses impulsivity. He exhibits a depressed mood.    Review of Systems  Psychiatric/Behavioral: Positive for depression and hallucinations. The patient is nervous/anxious and has insomnia.   All other systems reviewed and are negative.   There were no vitals taken for this visit.There is no height or weight on file to calculate BMI.  General Appearance: Casual  Eye Contact:  Fair  Speech:  Pressured  Volume:  Normal  Mood:  Anxious  Affect:  Full Range  Thought Process:  Goal Directed  Orientation:  Full (Time, Place, and Person)  Thought Content:  Hallucinations: Auditory  Suicidal Thoughts:  No  Homicidal Thoughts:  No  Memory:   Immediate;   Fair  Judgement:  Impaired  Insight:  Lacking  Psychomotor Activity:  Normal  Concentration: Concentration: Poor  Recall:  FiservFair  Fund of Knowledge:Fair  Language: Fair  Akathisia:  NA  Handed:  Right  AIMS (if indicated):     Assets:  Desire for Improvement  Sleep:       Musculoskeletal: Strength & Muscle Tone: within normal limits Gait & Station: normal Patient leans: N/A  There were no vitals taken for this visit.  Recommendations:  Based on my evaluation the patient appears to have an emergency medical condition for which I recommend the patient be transferred to the emergency department for further evaluation.  Albert HoughSpencer E Ryler Laskowski, PA-C 07/04/2017, 4:10 AM

## 2017-07-05 DIAGNOSIS — Z79899 Other long term (current) drug therapy: Secondary | ICD-10-CM

## 2017-07-05 DIAGNOSIS — R45851 Suicidal ideations: Secondary | ICD-10-CM

## 2017-07-05 DIAGNOSIS — Z818 Family history of other mental and behavioral disorders: Secondary | ICD-10-CM | POA: Diagnosis not present

## 2017-07-05 DIAGNOSIS — F250 Schizoaffective disorder, bipolar type: Secondary | ICD-10-CM | POA: Diagnosis not present

## 2017-07-05 DIAGNOSIS — F419 Anxiety disorder, unspecified: Secondary | ICD-10-CM | POA: Diagnosis not present

## 2017-07-05 DIAGNOSIS — Z87891 Personal history of nicotine dependence: Secondary | ICD-10-CM

## 2017-07-05 MED ORDER — QUETIAPINE FUMARATE 200 MG PO TABS: 200.0000 mg | ORAL_TABLET | Freq: Every day | ORAL | 0 refills | Status: DC

## 2017-07-05 MED ORDER — HYDROXYZINE HCL 25 MG PO TABS: 25.0000 mg | ORAL_TABLET | Freq: Four times a day (QID) | ORAL | 0 refills | Status: DC | PRN

## 2017-07-05 MED ORDER — GABAPENTIN 300 MG PO CAPS: 300.0000 mg | ORAL_CAPSULE | Freq: Two times a day (BID) | ORAL | 0 refills | Status: DC

## 2017-07-05 NOTE — B[facility] Suicide Risk Assessment (Signed)
Suicide Risk Assessment  Discharge Assessment   [facility]   Principal Problem: Schizoaffective disorder, bipolar type [facility](HCC) Discharge Diagnoses:  Patient Active Problem List   Diagnosis Date Noted  . Schizoaffective disorder, bipolar type (HCC) [F25.0] 01/04/2017    Priority: High  . Elevated TSH [R94.6] 01/05/2017  . Tachycardia [R00.0] 01/05/2017  . Moderate cannabis use disorder, in early remission (HCC) [F12.21] 01/04/2017  . Alcohol use disorder, moderate, in early remission (HCC) [F10.21] 01/04/2017  . Alcoholism (HCC) [F10.20]   . Drug abuse [F19.10]   . Medical non-compliance [Z91.19]     Total Time spent with patient: 45 minutes  Musculoskeletal: Strength & Muscle Tone: within normal limits Gait & Station: normal Patient leans: N/A  Psychiatric Specialty Exam: Physical Exam  Constitutional: He is oriented to person, place, and time. He appears well-developed and well-nourished.  HENT:  Head: Normocephalic.  Neck: Normal range of motion.  Respiratory: Effort normal.  Musculoskeletal: Normal range of motion.  Neurological: He is alert and oriented to person, place, and time.  Psychiatric: His speech is normal and behavior is normal. Judgment and thought content normal. Cognition and memory are normal. He exhibits a depressed mood.    Review of Systems  Psychiatric/Behavioral: Positive for depression.  All other systems reviewed and are negative.   Blood pressure 139/88, pulse 98, temperature 98.7 F (37.1 C), temperature source Oral, resp. rate 16, height 5\' 10"  (1.778 m), weight 99.8 kg (220 lb), SpO2 98 %.Body mass index is 31.57 kg/m.  General Appearance: Casual  Eye Contact:  Good  Speech:  Normal Rate  Volume:  Normal  Mood:  Depressed  Affect:  Blunt  Thought Process:  Coherent and Descriptions of Associations: Intact  Orientation:  Full (Time, Place, and Person)  Thought Content:  WDL and Logical  Suicidal Thoughts:  No   Homicidal Thoughts:  No  Memory:  Immediate;   Good Recent;   Good Remote;   Good  Judgement:  Good  Insight:  Good  Psychomotor Activity:  Normal  Concentration:  Concentration: Good and Attention Span: Good  Recall:  Good  Fund of Knowledge:  Good  Language:  Good  Akathisia:  No  Handed:  Right  AIMS (if indicated):     Assets:  Housing Leisure Time Physical Health Resilience Social Support  ADL's:  Intact  Cognition:  WNL  Sleep:       Mental Status Per Nursing Assessment::   On Admission:   paranoia  Demographic Factors:  Male, Caucasian and Living alone  Loss Factors: NA  Historical Factors: NA  Risk Reduction Factors:   Sense of responsibility to family, Positive social support and Positive therapeutic relationship  Continued Clinical Symptoms:  Depression, mild  Cognitive Features That Contribute To Risk:  None    Suicide Risk:  Minimal: No identifiable suicidal ideation.  Patients presenting with no risk factors but with morbid ruminations; may be classified as minimal risk based on the severity of the depressive symptoms    Plan Of Care/Follow-up recommendations:  Activity:  as tolerated Diet:  heart healthy diet  LORD, JAMISON, NP 07/05/2017, 10:16 AM

## 2017-07-05 NOTE — Consult Note (Signed)
[facility] Consult   Reason for Consult:  Paranoia with suicidal ideations Referring Physician:  EDP Patient Identification: Albert [name] MRN:  [id number] Principal Diagnosis: Schizoaffective disorder, bipolar type [facility]) Diagnosis:   Patient Active Problem List   Diagnosis Date Noted  . Schizoaffective disorder, bipolar type ([place]) [F25.0] 01/04/2017    Priority: High  . Elevated TSH [R94.6] 01/05/2017  . Tachycardia [R00.0] 01/05/2017  . Moderate cannabis use disorder, in early remission ([place]) [F12.21] 01/04/2017  . Alcohol use disorder, moderate, in early remission ([place]) [F10.21] 01/04/2017  . Alcoholism ([place]) [F10.20]   . Drug abuse [F19.10]   . Medical non-compliance [Z91.19]     Total Time spent with patient: 45 minutes  Subjective:   Albert [name] is a 41 y.o. male patient does not warrant admission  HPI:  41 yo male who presented to the ED with paranoia and suicidal ideations along with some hallucinations.  His medications were adjusted and he stabilized with no hallucinations, suicidal/homicidal ideations, or substance abuse issues.  HIs medications appeared to be on the heavy side which appeared to affect his cognition.  He feels "clear headed" today as his thought processes have cleared, coherent.  Ralph wants to go to a social support meeting tonight and wants to leave.  He reports feeling safe to return home, stable for discharge.  Past Psychiatric History: schizoaffective disorder, substance abuse  Risk to Self: Is patient at risk for suicide?: No, but patient needs Medical Clearance Risk to Others:  None Prior Inpatient Therapy:  Yes Prior Outpatient Therapy:  Yes  Past Medical History:  Past Medical History:  Diagnosis Date  . Alcoholism ([place])   . Drug abuse    xanax addiction 5 years ago  . Medical non-compliance   . Paranoid schizophrenia ([place])   . Seizures ([place])    History reviewed. No pertinent surgical  history. Family History:  Family History  Problem Relation Age of Onset  . Depression Maternal Aunt    Family Psychiatric  History: unknown Social History:  History  Alcohol Use No    Comment: 11 months sober in March 2018     History  Drug Use No    Social History   Social History  . Marital status: Single    Spouse name: N/A  . Number of children: N/A  . Years of education: N/A   Social History Main Topics  . Smoking status: Former Research scientist (life sciences)  . Smokeless tobacco: Never Used     Comment: Non-smoker  . Alcohol use No     Comment: 11 months sober in March 2018  . Drug use: No  . Sexual activity: Not Currently   Other Topics Concern  . None   Social History Narrative  . None   Additional Social History:    Allergies:  No Known Allergies  Labs:  Results for orders placed or performed during the hospital encounter of 07/04/17 (from the past 48 hour(s))  Comprehensive metabolic panel     Status: Abnormal   Collection Time: 07/04/17  3:43 AM  Result Value Ref Range   Sodium 141 135 - 145 mmol/L   Potassium 3.6 3.5 - 5.1 mmol/L   Chloride 104 101 - 111 mmol/L   CO2 26 22 - 32 mmol/L   Glucose, Bld 111 (H) 65 - 99 mg/dL   BUN 9 6 - 20 mg/dL   Creatinine, Ser 0.82 0.61 - 1.24 mg/dL   Calcium 9.4 8.9 - 10.3 mg/dL   Total Protein 7.8 6.5 -  8.1 g/dL   Albumin 4.7 3.5 - 5.0 g/dL   AST 24 15 - 41 U/L   ALT 23 17 - 63 U/L   Alkaline Phosphatase 55 38 - 126 U/L   Total Bilirubin 0.5 0.3 - 1.2 mg/dL   GFR calc non Af Amer >60 >60 mL/min   GFR calc Af Amer >60 >60 mL/min    Comment: (NOTE) The eGFR has been calculated using the CKD EPI equation. This calculation has not been validated in all clinical situations. eGFR's persistently <60 mL/min signify possible Chronic Kidney Disease.    Anion gap 11 5 - 15  Ethanol     Status: None   Collection Time: 07/04/17  3:43 AM  Result Value Ref Range   Alcohol, Ethyl (B) <5 <5 mg/dL    Comment:        LOWEST DETECTABLE  LIMIT FOR SERUM ALCOHOL IS 5 mg/dL FOR MEDICAL PURPOSES ONLY   Salicylate level     Status: None   Collection Time: 07/04/17  3:43 AM  Result Value Ref Range   Salicylate Lvl <0.1 2.8 - 30.0 mg/dL  Acetaminophen level     Status: Abnormal   Collection Time: 07/04/17  3:43 AM  Result Value Ref Range   Acetaminophen (Tylenol), Serum <10 (L) 10 - 30 ug/mL    Comment:        THERAPEUTIC CONCENTRATIONS VARY SIGNIFICANTLY. A RANGE OF 10-30 ug/mL MAY BE AN EFFECTIVE CONCENTRATION FOR MANY PATIENTS. HOWEVER, SOME ARE BEST TREATED AT CONCENTRATIONS OUTSIDE THIS RANGE. ACETAMINOPHEN CONCENTRATIONS >150 ug/mL AT 4 HOURS AFTER INGESTION AND >50 ug/mL AT 12 HOURS AFTER INGESTION ARE OFTEN ASSOCIATED WITH TOXIC REACTIONS.   cbc     Status: None   Collection Time: 07/04/17  3:43 AM  Result Value Ref Range   WBC 7.1 4.0 - 10.5 K/uL   RBC 4.74 4.22 - 5.81 MIL/uL   Hemoglobin 15.7 13.0 - 17.0 g/dL   HCT 44.5 39.0 - 52.0 %   MCV 93.9 78.0 - 100.0 fL   MCH 33.1 26.0 - 34.0 pg   MCHC 35.3 30.0 - 36.0 g/dL   RDW 13.1 11.5 - 15.5 %   Platelets 266 150 - 400 K/uL  Rapid urine drug screen (hospital performed)     Status: None   Collection Time: 07/04/17  3:50 AM  Result Value Ref Range   Opiates NONE DETECTED NONE DETECTED   Cocaine NONE DETECTED NONE DETECTED   Benzodiazepines NONE DETECTED NONE DETECTED   Amphetamines NONE DETECTED NONE DETECTED   Tetrahydrocannabinol NONE DETECTED NONE DETECTED   Barbiturates NONE DETECTED NONE DETECTED    Comment:        DRUG SCREEN FOR MEDICAL PURPOSES ONLY.  IF CONFIRMATION IS NEEDED FOR ANY PURPOSE, NOTIFY LAB WITHIN 5 DAYS.        LOWEST DETECTABLE LIMITS FOR URINE DRUG SCREEN Drug Class       Cutoff (ng/mL) Amphetamine      1000 Barbiturate      200 Benzodiazepine   751 Tricyclics       025 Opiates          300 Cocaine          300 THC              50     Current Facility-Administered Medications  Medication Dose Route Frequency  Provider Last Rate Last Dose  . acetaminophen (TYLENOL) tablet 650 mg  650 mg Oral Q4H PRN Varney Biles, MD  650 mg at 07/04/17 2015  . diphenhydrAMINE (BENADRYL) injection 25 mg  25 mg Intramuscular Once PRN Starkes, Takia S, FNP      . gabapentin (NEURONTIN) capsule 300 mg  300 mg Oral BID Darleene Cleaver, Reed Dady, MD   300 mg at 07/04/17 2105  . hydrOXYzine (ATARAX/VISTARIL) tablet 25 mg  25 mg Oral Q6H PRN Lindon Romp A, NP   25 mg at 07/04/17 2016  . risperiDONE (RISPERDAL M-TABS) disintegrating tablet 2 mg  2 mg Oral Q8H PRN Varney Biles, MD   2 mg at 07/05/17 0843   And  . LORazepam (ATIVAN) tablet 1 mg  1 mg Oral PRN Nanavati, Ankit, MD      . ondansetron (ZOFRAN) tablet 4 mg  4 mg Oral Q8H PRN Nanavati, Ankit, MD      . pantoprazole (PROTONIX) EC tablet 40 mg  40 mg Oral BID AC Jep Dyas, MD   40 mg at 07/05/17 0812  . QUEtiapine (SEROQUEL) tablet 200 mg  200 mg Oral QHS Endora Teresi, MD   200 mg at 07/04/17 2105   Current Outpatient Prescriptions  Medication Sig Dispense Refill  . divalproex (DEPAKOTE ER) 500 MG 24 hr tablet Take 1,500 mg by mouth at bedtime.    Marland Kitchen QUEtiapine (SEROQUEL) 200 MG tablet Take 1 tablet by mouth at bedtime.    Marland Kitchen amoxicillin (AMOXIL) 875 MG tablet Take 1 tablet (875 mg total) by mouth 2 (two) times daily. (Patient not taking: Reported on 07/04/2017) 14 tablet 0  . benztropine (COGENTIN) 0.5 MG tablet Take 1 tablet (0.5 mg total) by mouth 2 (two) times daily. (Patient not taking: Reported on 07/04/2017) 60 tablet 0  . divalproex (DEPAKOTE ER) 250 MG 24 hr tablet Take 5 tablets (1,250 mg total) by mouth at bedtime. (Patient not taking: Reported on 07/04/2017) 150 tablet 0  . gabapentin (NEURONTIN) 300 MG capsule Take 1 capsule (300 mg total) by mouth 3 (three) times daily at 8am, 3pm and bedtime. (Patient not taking: Reported on 07/04/2017) 90 capsule 0  . hydrOXYzine (ATARAX/VISTARIL) 25 MG tablet Take 1 tablet (25 mg total) by mouth every 6 (six) hours as  needed for anxiety. (Patient not taking: Reported on 07/04/2017) 30 tablet 0  . pantoprazole (PROTONIX) 40 MG tablet Take 1 tablet (40 mg total) by mouth 2 (two) times daily before a meal. (Patient not taking: Reported on 07/04/2017) 60 tablet 0  . propranolol (INDERAL) 10 MG tablet Take 1 tablet (10 mg total) by mouth 3 (three) times daily. (Patient not taking: Reported on 07/04/2017) 90 tablet 0  . QUEtiapine (SEROQUEL XR) 150 MG 24 hr tablet Take 5 tablets (750 mg total) by mouth at bedtime. (Patient not taking: Reported on 07/04/2017) 150 tablet 0  . zolpidem (AMBIEN) 5 MG tablet Take 1 tablet (5 mg total) by mouth at bedtime as needed for sleep. (Patient not taking: Reported on 07/04/2017) 7 tablet 0    Musculoskeletal: Strength & Muscle Tone: within normal limits Gait & Station: normal Patient leans: N/A  Psychiatric Specialty Exam: Physical Exam  Constitutional: He is oriented to person, place, and time. He appears well-developed and well-nourished.  HENT:  Head: Normocephalic.  Neck: Normal range of motion.  Respiratory: Effort normal.  Musculoskeletal: Normal range of motion.  Neurological: He is alert and oriented to person, place, and time.  Psychiatric: His speech is normal and behavior is normal. Judgment and thought content normal. Cognition and memory are normal. He exhibits a depressed mood.    Review  of Systems  Psychiatric/Behavioral: Positive for depression.  All other systems reviewed and are negative.   Blood pressure 139/88, pulse 98, temperature 98.7 F (37.1 C), temperature source Oral, resp. rate 16, height _0  (1.778 m), weight 99.8 kg (220 lb), SpO2 98 %.Body mass index is 31.57 kg/m.  General Appearance: Casual  Eye Contact:  Good  Speech:  Normal Rate  Volume:  Normal  Mood:  Depressed  Affect:  Blunt  Thought Process:  Coherent and Descriptions of Associations: Intact  Orientation:  Full (Time, Place, and Person)  Thought Content:  WDL and Logical   Suicidal Thoughts:  No  Homicidal Thoughts:  No  Memory:  Immediate;   Good Recent;   Good Remote;   Good  Judgement:  Good  Insight:  Good  Psychomotor Activity:  Normal  Concentration:  Concentration: Good and Attention Span: Good  Recall:  Good  Fund of Knowledge:  Good  Language:  Good  Akathisia:  No  Handed:  Right  AIMS (if indicated):     Assets:  Housing Leisure Time Physical Health Resilience Social Support  ADL's:  Intact  Cognition:  WNL  Sleep:        Treatment Plan Summary: Daily contact with patient to assess and evaluate symptoms and progress in treatment, Medication management and Plan schizoaffective disorder, bipolar type:  -Crisis stabilization -Medication management:  Started gabapentin 300 mg BID for anxiety, Vistaril 25 mg every six hours PRN anxiety, and Seroquel 200 mg at bedtime for psychosis.  Other medications discontinued due to appearing to be over medicated. -Individual counseling  Disposition: No evidence of imminent risk to self or others at present.    Waylan Boga, NP 07/05/2017 10:06 AM  Patient seen face-to-face for psychiatric evaluation, chart reviewed and case discussed with the physician extender and developed treatment plan. Reviewed the information documented and agree with the treatment plan. Corena Pilgrim, MD

## 2017-07-05 NOTE — Discharge Instructions (Signed)
For your behavioral health needs, you are advised to continue treatment with Ellamae SiaKeshavpal Reddy, MD and with Daiva Evesobert Milan, LCSW:       Ellamae SiaKeshavpal Reddy, MD      [facility]      [address], [address]      [place], [place] [address]      [phone]       Daiva Evesobert Milan, LCSW      [address]      [place], [place] [address]      [phone]

## 2017-07-05 NOTE — BH Assessment (Signed)
BHH Assessment Progress Note  Per Thedore MinsMojeed Akintayo, MD, this pt does not require psychiatric hospitalization at this time.  [facility] recommendation to continue treatment Ellamae SiaKeshavpal Reddy, MD and with [facility], LCSW.  This has been included in pt's discharge instructions.  Pt's nurse, Lincoln MaxinOlivette, has been notified.  Doylene Canninghomas Adewale Pucillo, MA Triage Specialist [phone]

## 2017-07-06 ENCOUNTER — Encounter (HOSPITAL_COMMUNITY): Payer: Self-pay | Admitting: *Deleted

## 2017-07-06 DIAGNOSIS — Z87891 Personal history of nicotine dependence: Secondary | ICD-10-CM | POA: Diagnosis not present

## 2017-07-06 DIAGNOSIS — F558 Abuse of other non-psychoactive substances: Secondary | ICD-10-CM | POA: Diagnosis not present

## 2017-07-06 DIAGNOSIS — Z008 Encounter for other general examination: Secondary | ICD-10-CM | POA: Diagnosis not present

## 2017-07-06 DIAGNOSIS — R441 Visual hallucinations: Secondary | ICD-10-CM | POA: Insufficient documentation

## 2017-07-06 DIAGNOSIS — Z79899 Other long term (current) drug therapy: Secondary | ICD-10-CM | POA: Diagnosis not present

## 2017-07-06 DIAGNOSIS — F99 Mental disorder, not otherwise specified: Secondary | ICD-10-CM | POA: Diagnosis present

## 2017-07-06 NOTE — ED Triage Notes (Signed)
The pts family member reports that the pt is psychotic  He has been off his meds.  He was admitted to [facility] Thursday night he was released Friday.  He is hearing voices vacant stare he is not answering many questions

## 2017-07-07 ENCOUNTER — Emergency Department (HOSPITAL_COMMUNITY)
Admission: EM | Admit: 2017-07-07 | Discharge: 2017-07-08 | Disposition: A | Discharge status: Other Acute Inpatient Hospital | Payer: Medicare Other | Attending: Emergency Medicine | Admitting: Emergency Medicine

## 2017-07-07 DIAGNOSIS — Z008 Encounter for other general examination: Secondary | ICD-10-CM | POA: Diagnosis not present

## 2017-07-07 LAB — CBC WITH DIFFERENTIAL/PLATELET
Basophils Absolute: 0 10*3/uL (ref 0.0–0.1)
Basophils Relative: 1 %
EOS ABS: 0 10*3/uL (ref 0.0–0.7)
Eosinophils Relative: 0 %
HCT: 44.8 % (ref 39.0–52.0)
HEMOGLOBIN: 15.4 g/dL (ref 13.0–17.0)
Lymphocytes Relative: 30 %
Lymphs Abs: 2.5 10*3/uL (ref 0.7–4.0)
MCH: 32.3 pg (ref 26.0–34.0)
MCHC: 34.4 g/dL (ref 30.0–36.0)
MCV: 93.9 fL (ref 78.0–100.0)
Monocytes Absolute: 1.1 10*3/uL — ABNORMAL HIGH (ref 0.1–1.0)
Monocytes Relative: 13 %
NEUTROS PCT: 56 %
Neutro Abs: 4.7 10*3/uL (ref 1.7–7.7)
Platelets: 265 10*3/uL (ref 150–400)
RBC: 4.77 MIL/uL (ref 4.22–5.81)
RDW: 13.3 % (ref 11.5–15.5)
WBC: 8.3 10*3/uL (ref 4.0–10.5)

## 2017-07-07 LAB — COMPREHENSIVE METABOLIC PANEL
ALBUMIN: 4.2 g/dL (ref 3.5–5.0)
ALK PHOS: 55 U/L (ref 38–126)
ALT: 29 U/L (ref 17–63)
ANION GAP: 9 (ref 5–15)
AST: 29 U/L (ref 15–41)
BUN: 7 mg/dL (ref 6–20)
CALCIUM: 9.2 mg/dL (ref 8.9–10.3)
CO2: 27 mmol/L (ref 22–32)
Chloride: 102 mmol/L (ref 101–111)
Creatinine, Ser: 0.9 mg/dL (ref 0.61–1.24)
GFR calc Af Amer: >60 mL/min (ref >60)
GFR calc non Af Amer: >60 mL/min (ref >60)
GLUCOSE: 112 mg/dL — AB (ref 65–99)
Potassium: 3.9 mmol/L (ref 3.5–5.1)
SODIUM: 138 mmol/L (ref 135–145)
Total Bilirubin: 0.8 mg/dL (ref 0.3–1.2)
Total Protein: 7.5 g/dL (ref 6.5–8.1)

## 2017-07-07 LAB — RAPID URINE DRUG SCREEN, HOSP PERFORMED
AMPHETAMINES: NOT DETECTED
BENZODIAZEPINES: NOT DETECTED
Barbiturates: NOT DETECTED
COCAINE: NOT DETECTED
Opiates: NOT DETECTED
Tetrahydrocannabinol: NOT DETECTED

## 2017-07-07 LAB — ETHANOL: Alcohol, Ethyl (B): <5 mg/dL (ref <5)

## 2017-07-07 LAB — SALICYLATE LEVEL

## 2017-07-07 LAB — ACETAMINOPHEN LEVEL: Acetaminophen (Tylenol), Serum: <10 ug/mL — ABNORMAL LOW (ref 10–30)

## 2017-07-07 MED ORDER — PROPRANOLOL HCL 10 MG PO TABS
10.0000 mg | ORAL_TABLET | Freq: Three times a day (TID) | ORAL | Status: DC
  Filled 2017-07-07: qty 1

## 2017-07-07 MED ORDER — RISPERIDONE 1 MG PO TABS
2.0000 mg | ORAL_TABLET | Freq: Once | ORAL | Status: AC
  Administered 2017-07-07: 2 mg via ORAL
  Filled 2017-07-07: qty 2

## 2017-07-07 MED ORDER — HYDROXYZINE HCL 25 MG PO TABS: 25.0000 mg | ORAL_TABLET | Freq: Four times a day (QID) | ORAL | Status: DC | PRN

## 2017-07-07 MED ORDER — QUETIAPINE FUMARATE 200 MG PO TABS
200.0000 mg | ORAL_TABLET | Freq: Every day | ORAL | Status: DC
  Administered 2017-07-07: 200 mg via ORAL
  Filled 2017-07-07: qty 1

## 2017-07-07 MED ORDER — BENZTROPINE MESYLATE 1 MG PO TABS: 0.5000 mg | ORAL_TABLET | Freq: Two times a day (BID) | ORAL | Status: DC

## 2017-07-07 MED ORDER — LORAZEPAM 1 MG PO TABS
2.0000 mg | ORAL_TABLET | Freq: Once | ORAL | Status: AC
  Administered 2017-07-07: 2 mg via ORAL
  Filled 2017-07-07: qty 2

## 2017-07-07 MED ORDER — PANTOPRAZOLE SODIUM 40 MG PO TBEC
40.0000 mg | DELAYED_RELEASE_TABLET | Freq: Two times a day (BID) | ORAL | Status: DC
Start: 2017-07-07 — End: 2017-07-08
  Administered 2017-07-07 – 2017-07-08 (×3): 40 mg via ORAL
  Filled 2017-07-07 (×3): qty 1

## 2017-07-07 MED ORDER — DIVALPROEX SODIUM ER 500 MG PO TB24
1500.0000 mg | ORAL_TABLET | Freq: Every day | ORAL | Status: DC
  Administered 2017-07-07: 1500 mg via ORAL
  Filled 2017-07-07: qty 3

## 2017-07-07 MED ORDER — LORAZEPAM 1 MG PO TABS
1.0000 mg | ORAL_TABLET | ORAL | Status: DC | PRN
  Administered 2017-07-07: 1 mg via ORAL
  Filled 2017-07-07: qty 1

## 2017-07-07 MED ORDER — HYDROXYZINE HCL 50 MG PO TABS
50.0000 mg | ORAL_TABLET | Freq: Four times a day (QID) | ORAL | Status: DC | PRN
  Administered 2017-07-07 (×2): 50 mg via ORAL
  Filled 2017-07-07 (×2): qty 1

## 2017-07-07 MED ORDER — DIVALPROEX SODIUM ER 500 MG PO TB24: 1500.0000 mg | ORAL_TABLET | Freq: Every day | ORAL | Status: DC

## 2017-07-07 MED ORDER — GABAPENTIN 300 MG PO CAPS: 300.0000 mg | ORAL_CAPSULE | Freq: Two times a day (BID) | ORAL | Status: DC

## 2017-07-07 MED ORDER — ZOLPIDEM TARTRATE 5 MG PO TABS: 5.0000 mg | ORAL_TABLET | Freq: Every evening | ORAL | Status: DC | PRN

## 2017-07-07 MED ORDER — GABAPENTIN 300 MG PO CAPS
300.0000 mg | ORAL_CAPSULE | ORAL | Status: DC
  Administered 2017-07-07 – 2017-07-08 (×4): 300 mg via ORAL
  Filled 2017-07-07 (×4): qty 1

## 2017-07-07 MED ORDER — QUETIAPINE FUMARATE ER 50 MG PO TB24: 750.0000 mg | ORAL_TABLET | Freq: Every day | ORAL | Status: DC

## 2017-07-07 NOTE — ED Notes (Signed)
Pt attempted to close door to room - advised pt unable to do so d/t safety - continuing to attempt. [employer] Officer present and spoke w/pt - pt voiced understanding door to remain open and sat on bed.

## 2017-07-07 NOTE — ED Notes (Signed)
Pt continues w/delusional thoughts - paranoid - initially refusing to take po meds then agreed. Pt asking for Coke - advised he may have it after he empties his bladder in bathroom.

## 2017-07-07 NOTE — ED Notes (Addendum)
Re-TTS being performed.  

## 2017-07-07 NOTE — ED Notes (Signed)
Mother visiting w/pt - pt and mother asking when Re-TTS will be performed. Myrla HalstedAdvised Paige, [facility]. Atarax given as requested to assist pt w/anxiety.

## 2017-07-07 NOTE — ED Notes (Signed)
Mother leaving at this time - aware of tx plan - seeking placement.

## 2017-07-07 NOTE — ED Notes (Signed)
Pt aware and voiced agreement w/tx plan [facility]. When pt signed consent form, pt signed "Alonza BogusChris  Cotton" because this is who "Aurther Lofterry" told him to sign as. Asked pt to sign his name - pt refused. States "Alonza BogusChris Cotton" will do. Pt stating he wants to empty his bladder but does not feel like walking. Asking for "bottle" to use. Advised pt staff will assist him to bathroom - pt declined. Stating he wants to be left alone and will "pee in a cup". Advised pt inappropriate. Pt refuses to ambulate to bathroom even after much encouragement.

## 2017-07-07 NOTE — ED Notes (Signed)
Pt finished breakfast tray.

## 2017-07-07 NOTE — ED Notes (Signed)
Dr Particia NearingHaviland has IVC paperwork to complete d/t pt delusional, paranoid, psychotic. PO med order received.

## 2017-07-07 NOTE — ED Notes (Signed)
[name] [facility] and [facility] now [facility] - aware may be transported this evening or in am.

## 2017-07-07 NOTE — BH Assessment (Signed)
Pt is guarded during reassessment. He says, "My mind is throwing stuff around right now." Pt says he is experiencing "just some delusions here and there." He is does not elaborate. Pt initially states that he wants to go home. Pt now agreeable to stay in ED overnight while TTS continues to seek placement.  Pt's mom is bedside, Sela HuaPetra Rodriges. She reports pt thought his home psych meds were poisoned, so he refused to take them. She says pt has complained of his mind racing and being unable to collect his thoughts. Mom reports pt appears more agitated today than yesterday. She thinks pt needs inpatient admission.  Ferne ReusJustina Okonkwo NP recommends inpatient treatment.   Evette Cristalaroline Paige Rai [place], [place] Therapeutic Triage Specialist

## 2017-07-07 NOTE — BH Assessment (Addendum)
Tele Assessment Note   Patient Name: Albert [name] MRN: [id number] Referring Physician: Roxy Horseman, PA Location of Patient: [facility] Location of Provider: Behavioral Health TTS Department  Leverne Amrhein is an 41 y.o. male.  Patient's mother brought him to [facility].  Patient was a walk in patient at [facility] on Thursday, 09/06.  He was assessed at that time and sent to [facility] for medical clearance.  He was discharged from there on Friday morning.  Patient says that he has been hearing sometimes the voices of children talking to him.  He dendies command hallucinations.  Patient says "I don't know what my thoughts are, if they are delusional."  Patient voices concern over whether he knows what is real.  He says that he has trouble remembering things.    Patient does not report any SI, HI.  He says he sees "clouds and shapes."  Patient has been without his medication for a week.  He is followed by Dr. Betti Cruz.  Patient is also seen by therapist [facility].  Patient has been at [facility] in 2018, 2014, 2013 and 2011.    When asked initially if he lives with anyone patient says yes, he has a roommate.  Mother had to correct him that he lived by himself.  Patient appeared to be confused about this and said "I guess that is a delusion that I have at times."  -Clinician discussed patient care with Nira Conn, FNP who recommends inpatient psychiatric care to address worsening of condition.  Patient care discussed with Ivar Drape, PA who is in agreement with disposition.  TTS to seek placement.  Patient is denying any  Diagnosis: Schizoaffective d/o bipolar type  Past Medical History:  Past Medical History:  Diagnosis Date  . Alcoholism (HCC)   . Drug abuse    xanax addiction 5 years ago  . Medical non-compliance   . Paranoid schizophrenia (HCC)   . Seizures (HCC)     History reviewed. No pertinent surgical history.  Family History:  Family History  Problem Relation Age of Onset  .  Depression Maternal Aunt     Social History:  reports that he has quit smoking. He has never used smokeless tobacco. He reports that he does not drink alcohol or use drugs.  Additional Social History:  Alcohol / Drug Use Pain Medications: None Prescriptions: Pt says he has medications but has not taken them in a week. Over the Counter: None History of alcohol / drug use?: No history of alcohol / drug abuse Longest period of sobriety (when/how long): Has been off ETOH and other drugs for a year and a half.  CIWA: CIWA-Ar BP: 126/90 Pulse Rate: 78 COWS:    PATIENT STRENGTHS: (choose at least two) Average or above average intelligence Capable of independent living Communication skills Supportive family/friends  Allergies: No Known Allergies  Home Medications:  (Not in a hospital admission)  OB/GYN Status:  No LMP for male patient.  General Assessment Data Location of Assessment: [facility] ED TTS Assessment: In system Is this a Tele or Face-to-Face Assessment?: Tele Assessment Is this an Initial Assessment or a Re-assessment for this encounter?: Initial Assessment Marital status: Single Is patient pregnant?: No Pregnancy Status: No Living Arrangements: Alone Can pt return to current living arrangement?: Yes Admission Status: Voluntary Is patient capable of signing voluntary admission?: Yes Referral Source: Self/Family/Friend (Mother brought him to the hospital.) Insurance type: [facility]     Crisis Care Plan Living Arrangements: Alone Name of Psychiatrist: Dr. Betti Cruz Name of  Therapist: Rodney Cruise  Education Status Is patient currently in school?: No Highest grade of school patient has completed: 12th grade  Risk to self with the past 6 months Suicidal Ideation: No Has patient been a risk to self within the past 6 months prior to admission? : Yes Suicidal Intent: No-Not Currently/Within Last 6 Months Has patient had any suicidal intent within the past 6 months prior to  admission? : No Is patient at risk for suicide?: No Suicidal Plan?: No Has patient had any suicidal plan within the past 6 months prior to admission? : No Access to Means: No What has been your use of drugs/alcohol within the last 12 months?: None Previous Attempts/Gestures: Yes How many times?: 1 Other Self Harm Risks: None Triggers for Past Attempts: Unpredictable Intentional Self Injurious Behavior: None Family Suicide History: Unknown Recent stressful life event(s): Turmoil (Comment) (Pt reports delusional thinking) Persecutory voices/beliefs?: No Depression: Yes Depression Symptoms: Despondent, Isolating, Loss of interest in usual pleasures, Feeling worthless/self pity, Insomnia Substance abuse history and/or treatment for substance abuse?: No Suicide prevention information given to non-admitted patients: Not applicable  Risk to Others within the past 6 months Homicidal Ideation: No Does patient have any lifetime risk of violence toward others beyond the six months prior to admission? : No Thoughts of Harm to Others: No Current Homicidal Intent: No Current Homicidal Plan: No Access to Homicidal Means: No Identified Victim: No one History of harm to others?: No Assessment of Violence: None Noted Violent Behavior Description: None reported Does patient have access to weapons?: No Criminal Charges Pending?: No Does patient have a court date: No Is patient on probation?: No  Psychosis Hallucinations: Auditory, Visual (Children's voices sometimes; different shapes and clouds) Delusions: Unspecified (Believes he has delusional behavior)  Mental Status Report Appearance/Hygiene: Disheveled, Body odor, In scrubs Eye Contact: Good Motor Activity: Freedom of movement, Unremarkable Speech: Incoherent, Tangential Level of Consciousness: Alert Mood: Depressed, Helpless Affect: Depressed, Blunted Anxiety Level: Moderate Thought Processes: Irrelevant, Tangential Judgement:  Partial Orientation: Person, Place, Situation, Time Obsessive Compulsive Thoughts/Behaviors: None  Cognitive Functioning Concentration: Decreased Memory: Recent Impaired, Remote Intact IQ: Average Insight: Poor Impulse Control: Poor Appetite: Poor Weight Loss:  ("I haven't been eating very good lately.) Weight Gain: 0 Sleep: No Change Total Hours of Sleep:  (8) Vegetative Symptoms: None  ADLScreening [facility] Assessment Services) Patient's cognitive ability adequate to safely complete daily activities?: Yes Patient able to express need for assistance with ADLs?: Yes Independently performs ADLs?: Yes (appropriate for developmental age)  Prior Inpatient Therapy Prior Inpatient Therapy: Yes Prior Therapy Dates: Various Prior Therapy Facilty/Provider(s): [facility] Reason for Treatment: Psychosis  Prior Outpatient Therapy Prior Outpatient Therapy: Yes Prior Therapy Dates: Ongoing Prior Therapy Facilty/Provider(s): Dr. Betti Cruz; [facility] Reason for Treatment: med management; depression Does patient have an ACCT team?: No Does patient have Intensive In-House Services?  : No Does patient have [facility] services? : No Does patient have P4CC services?: No  ADL Screening (condition at time of admission) Patient's cognitive ability adequate to safely complete daily activities?: Yes Is the patient deaf or have difficulty hearing?: No Does the patient have difficulty seeing, even when wearing glasses/contacts?: No Does the patient have difficulty concentrating, remembering, or making decisions?: Yes Patient able to express need for assistance with ADLs?: Yes Does the patient have difficulty dressing or bathing?: No Independently performs ADLs?: Yes (appropriate for developmental age) Does the patient have difficulty walking or climbing stairs?: No Weakness of Legs: None Weakness of Arms/Hands: None  Abuse/Neglect Assessment (Assessment to be complete while patient is  alone) Physical Abuse: Denies Verbal Abuse: Denies Sexual Abuse: Denies Exploitation of patient/patient's resources: Denies Self-Neglect: Denies     Merchant navy officerAdvance Directives (For Healthcare) Does Patient Have a Medical Advance Directive?: No Would patient like information on creating a medical advance directive?: No - Patient declined    Additional Information 1:1 In Past 12 Months?: No CIRT Risk: No Elopement Risk: No Does patient have medical clearance?: Yes     Disposition:  Disposition Initial Assessment Completed for this Encounter: Yes Disposition of Patient: Other dispositions Type of inpatient treatment program: Adult Other disposition(s): Other (Comment) (To be reviewed with FNP)  This service was provided via telemedicine using a 2-way, interactive audio and video technology.  Names of all persons participating in this telemedicine service and their role in this encounter. Name: Sela Huaetra Kennerson Role: mother  Name:  Role:   Name:  Role:   Name:  Role:     Alexandria LodgeHarvey, Elvira Langston Ray 07/07/2017 4:09 AM

## 2017-07-07 NOTE — ED Notes (Signed)
Pt asking for snack - when Sitter asked pt what he wants for snack - pt declined - stating he just wanted a soda - given.

## 2017-07-07 NOTE — ED Notes (Signed)
IVC papers served [facility] copy sent to Medical Records - all 3 sets on clipboard.

## 2017-07-07 NOTE — ED Provider Notes (Signed)
[facility] DEPT Provider Note   CSN: [id number] Arrival date & time: 07/06/17  2239     History   Chief Complaint Chief Complaint  Patient presents with  . Psychiatric Evaluation    HPI Albert [name] is a 41 y.o. male.  Patient presents to the ED with a chief complaint of hearing voices.  He states that he was recently stopped on his meds.  States that he is very fearful.  States that he sees children and shapes.  He states that he is afraid of his visions.  He denies any pain.  He has not taken anything for his symptoms.  [facility].  He reports worsening symptoms.  Denies SI/HI.  He is accompanied by his mother, who states that his condition has been worsening.   The history is provided by the patient. No language interpreter was used.    Past Medical History:  Diagnosis Date  . Alcoholism (HCC)   . Drug abuse    xanax addiction 5 years ago  . Medical non-compliance   . Paranoid schizophrenia (HCC)   . Seizures [facility](HCC)     Patient Active Problem List   Diagnosis Date Noted  . Elevated TSH 01/05/2017  . Tachycardia 01/05/2017  . Schizoaffective disorder, bipolar type (HCC) 01/04/2017  . Moderate cannabis use disorder, in early remission (HCC) 01/04/2017  . Alcohol use disorder, moderate, in early remission (HCC) 01/04/2017  . Alcoholism (HCC)   . Drug abuse   . Medical non-compliance     History reviewed. No pertinent surgical history.     Home Medications    Prior to Admission medications   Medication Sig Start Date End Date Taking? Authorizing Provider  divalproex (DEPAKOTE ER) 500 MG 24 hr tablet Take 1,500 mg by mouth at bedtime. 05/22/17  Yes [provider]  QUEtiapine (SEROQUEL) 200 MG tablet Take 1 tablet (200 mg total) by mouth at bedtime. 07/05/17  Yes Charm RingsLord, Jamison Y, NP  benztropine (COGENTIN) 0.5 MG tablet Take 1 tablet (0.5 mg total) by mouth 2 (two) times daily. Patient not taking: Reported on  07/04/2017 01/09/17   Adonis BrookAgustin, Sheila, NP  divalproex (DEPAKOTE ER) 250 MG 24 hr tablet Take 5 tablets (1,250 mg total) by mouth at bedtime. Patient not taking: Reported on 07/04/2017 01/09/17   Adonis BrookAgustin, Sheila, NP  gabapentin (NEURONTIN) 300 MG capsule Take 1 capsule (300 mg total) by mouth 3 (three) times daily at 8am, 3pm and bedtime. Patient not taking: Reported on 07/04/2017 01/09/17   Adonis BrookAgustin, Sheila, NP  gabapentin (NEURONTIN) 300 MG capsule Take 1 capsule (300 mg total) by mouth 2 (two) times daily. Patient not taking: Reported on 07/07/2017 07/05/17   Charm RingsLord, Jamison Y, NP  hydrOXYzine (ATARAX/VISTARIL) 25 MG tablet Take 1 tablet (25 mg total) by mouth every 6 (six) hours as needed for anxiety. Patient not taking: Reported on 07/04/2017 01/09/17   Adonis BrookAgustin, Sheila, NP  hydrOXYzine (ATARAX/VISTARIL) 25 MG tablet Take 1 tablet (25 mg total) by mouth every 6 (six) hours as needed for anxiety. Patient not taking: Reported on 07/07/2017 07/05/17   Charm RingsLord, Jamison Y, NP  pantoprazole (PROTONIX) 40 MG tablet Take 1 tablet (40 mg total) by mouth 2 (two) times daily before a meal. Patient not taking: Reported on 07/07/2017 01/09/17   Adonis BrookAgustin, Sheila, NP  propranolol (INDERAL) 10 MG tablet Take 1 tablet (10 mg total) by mouth 3 (three) times daily. Patient not taking: Reported on 07/04/2017 01/09/17   Adonis BrookAgustin, Sheila,  NP  QUEtiapine (SEROQUEL XR) 150 MG 24 hr tablet Take 5 tablets (750 mg total) by mouth at bedtime. Patient not taking: Reported on 07/07/2017 01/09/17   Adonis Brook, NP  zolpidem (AMBIEN) 5 MG tablet Take 1 tablet (5 mg total) by mouth at bedtime as needed for sleep. Patient not taking: Reported on 07/04/2017 01/09/17   Adonis Brook, NP    Family History Family History  Problem Relation Age of Onset  . Depression Maternal Aunt     Social History Social History  Substance Use Topics  . Smoking status: Former Games developer  . Smokeless tobacco: Never Used     Comment: Non-smoker  . Alcohol use No      Comment: 11 months sober in March 2018     Allergies   Patient has no known allergies.   Review of Systems Review of Systems  All other systems reviewed and are negative.    Physical Exam Updated Vital Signs BP 126/90 (BP Location: Left Arm)   Pulse 78   Temp 98.2 F (36.8 C) (Oral)   Resp 16   Ht  (1.778 m)   Wt 105.2 kg (232 lb)   SpO2 98%   BMI 33.29 kg/m   Physical Exam  Constitutional: He is oriented to person, place, and time. He appears well-developed and well-nourished.  HENT:  Head: Normocephalic and atraumatic.  Eyes: Pupils are equal, round, and reactive to light. Conjunctivae and EOM are normal. Right eye exhibits no discharge. Left eye exhibits no discharge. No scleral icterus.  Neck: Normal range of motion. Neck supple. No JVD present.  Cardiovascular: Normal rate, regular rhythm and normal heart sounds.  Exam reveals no gallop and no friction rub.   No murmur heard. Pulmonary/Chest: Effort normal and breath sounds normal. No respiratory distress. He has no wheezes. He has no rales. He exhibits no tenderness.  Abdominal: Soft. He exhibits no distension and no mass. There is no tenderness. There is no rebound and no guarding.  Musculoskeletal: Normal range of motion. He exhibits no edema or tenderness.  Neurological: He is alert and oriented to person, place, and time.  Skin: Skin is warm and dry.  Psychiatric:  Agitated  Nursing note and vitals reviewed.    ED Treatments / Results  Labs (all labs ordered are listed, but only abnormal results are displayed) Labs Reviewed  CBC WITH DIFFERENTIAL/PLATELET  COMPREHENSIVE METABOLIC PANEL  ETHANOL  ACETAMINOPHEN LEVEL  SALICYLATE LEVEL  RAPID URINE DRUG SCREEN, HOSP PERFORMED    EKG  EKG Interpretation None       Radiology No results found.  Procedures Procedures (including critical care time)  Medications Ordered in ED Medications - No data to display   Initial Impression /  Assessment and Plan / ED Course  I have reviewed the triage vital signs and the nursing notes.  Pertinent labs & imaging results that were available during my care of the patient were reviewed by me and considered in my medical decision making (see chart for details).     Patient with visual hallucinations.  No SI/HI.  Labs are reassuring.   TTS recommends inpatient placement.  Final Clinical Impressions(s) / ED Diagnoses   Final diagnoses:  None    New Prescriptions New Prescriptions   No medications on file     Roxy Horseman, Cordelia Poche 07/07/17 2376    Derwood Kaplan, MD 07/07/17 2314

## 2017-07-07 NOTE — ED Notes (Signed)
Doing TTS assessment at this time.  Pharmacy verified medications.  Provider made aware of patient request for meds.

## 2017-07-07 NOTE — ED Notes (Signed)
Pt's mother, Durwin Noraetra, called - [phone]- [phone] - stated she was advised to call back this am to inquire about tx plan and re-TTS. Advised her home meds have been ordered - per pt's OK. Voiced understanding and appreciation.

## 2017-07-07 NOTE — ED Notes (Addendum)
Lying on bed w/eyes closed. Respirations even, unlabored. Pt's mother called and advised of pt's Sponsor's number to give to pt Albert [name]- Joshua - [phone]. Note w/name and number placed on pt's bedside table.

## 2017-07-08 ENCOUNTER — Encounter (HOSPITAL_COMMUNITY): Payer: Self-pay

## 2017-07-08 ENCOUNTER — Inpatient Hospital Stay (HOSPITAL_COMMUNITY)
Admission: AD | Admit: 2017-07-08 | Discharge: 2017-07-17 | DRG: 885 | Disposition: A | Discharge status: Home / Self Care | Payer: Medicare Other | Source: Intra-hospital | Attending: Psychiatry | Admitting: Psychiatry

## 2017-07-08 DIAGNOSIS — G4700 Insomnia, unspecified: Secondary | ICD-10-CM | POA: Diagnosis present

## 2017-07-08 DIAGNOSIS — Z87891 Personal history of nicotine dependence: Secondary | ICD-10-CM

## 2017-07-08 DIAGNOSIS — F250 Schizoaffective disorder, bipolar type: Principal | ICD-10-CM | POA: Diagnosis present

## 2017-07-08 DIAGNOSIS — Z818 Family history of other mental and behavioral disorders: Secondary | ICD-10-CM

## 2017-07-08 DIAGNOSIS — Z79899 Other long term (current) drug therapy: Secondary | ICD-10-CM

## 2017-07-08 DIAGNOSIS — R450 Nervousness: Secondary | ICD-10-CM | POA: Diagnosis not present

## 2017-07-08 DIAGNOSIS — R000 Tachycardia, unspecified: Secondary | ICD-10-CM | POA: Diagnosis not present

## 2017-07-08 DIAGNOSIS — Z008 Encounter for other general examination: Secondary | ICD-10-CM | POA: Diagnosis not present

## 2017-07-08 DIAGNOSIS — F1221 Cannabis dependence, in remission: Secondary | ICD-10-CM | POA: Diagnosis present

## 2017-07-08 DIAGNOSIS — F1021 Alcohol dependence, in remission: Secondary | ICD-10-CM

## 2017-07-08 DIAGNOSIS — F22 Delusional disorders: Secondary | ICD-10-CM | POA: Diagnosis not present

## 2017-07-08 DIAGNOSIS — R569 Unspecified convulsions: Secondary | ICD-10-CM | POA: Diagnosis present

## 2017-07-08 DIAGNOSIS — R4587 Impulsiveness: Secondary | ICD-10-CM | POA: Diagnosis not present

## 2017-07-08 DIAGNOSIS — F419 Anxiety disorder, unspecified: Secondary | ICD-10-CM | POA: Diagnosis not present

## 2017-07-08 DIAGNOSIS — F39 Unspecified mood [affective] disorder: Secondary | ICD-10-CM | POA: Diagnosis not present

## 2017-07-08 DIAGNOSIS — R440 Auditory hallucinations: Secondary | ICD-10-CM | POA: Diagnosis not present

## 2017-07-08 MED ORDER — ACETAMINOPHEN 325 MG PO TABS: 650.0000 mg | ORAL_TABLET | Freq: Four times a day (QID) | ORAL | Status: DC | PRN

## 2017-07-08 MED ORDER — TRAZODONE HCL 50 MG PO TABS
50.0000 mg | ORAL_TABLET | Freq: Every evening | ORAL | Status: DC | PRN
  Filled 2017-07-08: qty 1

## 2017-07-08 MED ORDER — LORAZEPAM 1 MG PO TABS: 1.0000 mg | ORAL_TABLET | ORAL | Status: DC | PRN

## 2017-07-08 MED ORDER — HYDROXYZINE HCL 25 MG PO TABS: 25.0000 mg | ORAL_TABLET | Freq: Three times a day (TID) | ORAL | Status: DC | PRN

## 2017-07-08 MED ORDER — HYDROXYZINE HCL 50 MG PO TABS: 50.0000 mg | ORAL_TABLET | Freq: Four times a day (QID) | ORAL | Status: DC | PRN

## 2017-07-08 MED ORDER — QUETIAPINE FUMARATE 200 MG PO TABS
200.0000 mg | ORAL_TABLET | Freq: Every day | ORAL | Status: DC
  Administered 2017-07-08 – 2017-07-11 (×4): 200 mg via ORAL
  Filled 2017-07-08 (×8): qty 1

## 2017-07-08 MED ORDER — ALUM & MAG HYDROXIDE-SIMETH 200-200-20 MG/5ML PO SUSP: 30.0000 mL | ORAL | Status: DC | PRN

## 2017-07-08 MED ORDER — GABAPENTIN 300 MG PO CAPS
300.0000 mg | ORAL_CAPSULE | ORAL | Status: DC
  Administered 2017-07-08 – 2017-07-17 (×26): 300 mg via ORAL
  Filled 2017-07-08 (×32): qty 1

## 2017-07-08 MED ORDER — MAGNESIUM HYDROXIDE 400 MG/5ML PO SUSP: 30.0000 mL | Freq: Every day | ORAL | Status: DC | PRN

## 2017-07-08 MED ORDER — PANTOPRAZOLE SODIUM 40 MG PO TBEC
40.0000 mg | DELAYED_RELEASE_TABLET | Freq: Two times a day (BID) | ORAL | Status: DC
  Administered 2017-07-09 – 2017-07-17 (×17): 40 mg via ORAL
  Filled 2017-07-08 (×21): qty 1

## 2017-07-08 MED ORDER — DIVALPROEX SODIUM ER 500 MG PO TB24
1500.0000 mg | ORAL_TABLET | Freq: Every day | ORAL | Status: DC
  Administered 2017-07-08: 1500 mg via ORAL
  Filled 2017-07-08 (×3): qty 3

## 2017-07-08 NOTE — Progress Notes (Signed)
Per Malva LimesLinsey Strader , [facility] ; Accepting provider is Ferne ReusJustina Okonkwo, NP; Attending provider is Dr. Elna BreslowEappen.  Malva LimesLinsey Strader, [facility] wil notify the patient's nurse when bed is ready.   Number for report is [phone]  Judithann SheenJamie Bailey,RN notified.    Baldo DaubJolan Robel Wuertz MSW, LCSWA CSW Disposition [phone]

## 2017-07-08 NOTE — ED Notes (Signed)
Pt using 2nd 5 minute phone call at this time. Pt reminded of rule for only 2 5 minute phone calls per day.

## 2017-07-08 NOTE — ED Notes (Signed)
Breakfast ordered regular  

## 2017-07-08 NOTE — ED Notes (Signed)
Pt eating meal tray 

## 2017-07-08 NOTE — ED Notes (Signed)
Regular Diet has been ordered. 

## 2017-07-08 NOTE — Progress Notes (Signed)
Patient ID: Albert [name], male   DOB: 09/07/1976, 41 y.o.   MRN: [id number] Per State regulations 482.30 this chart was reviewed for medical necessity with respect to the patient's admission/duration of stay.    Next review date: 07/12/17  Thurman CoyerEric Windi Toro, BSN, RN-BC  Case Manager

## 2017-07-08 NOTE — ED Notes (Signed)
Patient was given a cup of Decaf. Coffee.

## 2017-07-08 NOTE — ED Notes (Signed)
Pt back in bed watching TV

## 2017-07-08 NOTE — ED Notes (Signed)
Pt ambulated to shower. No assistance in shower, pt independent.

## 2017-07-08 NOTE — ED Notes (Signed)
Patient was given a Coke, and Cookies, and A Lunch order was taken.

## 2017-07-08 NOTE — ED Notes (Signed)
Patient just made his 2nd phone call.

## 2017-07-08 NOTE — ED Notes (Signed)
Pt given snack since he did not eat his dinner or have 9pm snack

## 2017-07-08 NOTE — ED Notes (Signed)
Patient sitting in chair in hall at this time.

## 2017-07-08 NOTE — ED Notes (Signed)
Patient at 100% of his meal.

## 2017-07-08 NOTE — ED Notes (Signed)
Patient given [name] Crackers, Coke, and A Regular Diet  Was Ordered for Albert LeeDinner.

## 2017-07-08 NOTE — Tx Team (Signed)
Initial Treatment Plan 07/08/2017 9:54 PM Albert Lefthristopher Guilbert ZOX:[id number]    PATIENT STRESSORS: Marital or family conflict Medication change or noncompliance   PATIENT STRENGTHS: Financial means General fund of knowledge Physical Health   PATIENT IDENTIFIED PROBLEMS: Psychosis  "Figure out what I'm supposed to be doing"  "Understand how to connect with my dad because he's working so much"  "What can I do to score some attention from him (father)"               DISCHARGE CRITERIA:  Improved stabilization in mood, thinking, and/or behavior Verbal commitment to aftercare and medication compliance  PRELIMINARY DISCHARGE PLAN: Outpatient therapy Medication management  PATIENT/FAMILY INVOLVEMENT: This treatment plan has been presented to and reviewed with the patient, Albert [name].  The patient and family have been given the opportunity to ask questions and make suggestions.  Levin BaconHeather V Kanani Mowbray, RN 07/08/2017, 9:54 PM

## 2017-07-08 NOTE — ED Notes (Signed)
Patient making his first phone call at this time. 

## 2017-07-08 NOTE — Progress Notes (Signed)
Cristal DeerChristopher is a 41 year old male being admitted involuntarily to 71505-2 from [facility].  He came to the ED accompanied by his mother.  He was bizarre, confused and not taking his medications because he thought they were poisoned.  He denied SI/HI.  He did report having "delusions" but couldn't elaborate.  [facility], he denied SI/HI or A/V hallucinations.  He denied any medical issues and appears to be in no physical distress.  He was unable to answer many questions appropriately and stated he didn't have a mother because she was dead, however she brought him in to the hospital.  Oriented hm to the unit.  Admission paperwork completed and signed.  Belongings searched and secured in locker # 31.  Skin assessment completed and no skin issues noted.  Q 15 minute checks initiated for safety.  We will monitor the progress towards his goals.

## 2017-07-09 ENCOUNTER — Encounter (HOSPITAL_COMMUNITY): Payer: Self-pay | Admitting: Psychiatry

## 2017-07-09 DIAGNOSIS — F1021 Alcohol dependence, in remission: Secondary | ICD-10-CM

## 2017-07-09 DIAGNOSIS — G4700 Insomnia, unspecified: Secondary | ICD-10-CM

## 2017-07-09 DIAGNOSIS — Z818 Family history of other mental and behavioral disorders: Secondary | ICD-10-CM

## 2017-07-09 DIAGNOSIS — R450 Nervousness: Secondary | ICD-10-CM

## 2017-07-09 DIAGNOSIS — R4587 Impulsiveness: Secondary | ICD-10-CM

## 2017-07-09 DIAGNOSIS — F22 Delusional disorders: Secondary | ICD-10-CM

## 2017-07-09 DIAGNOSIS — R440 Auditory hallucinations: Secondary | ICD-10-CM

## 2017-07-09 DIAGNOSIS — F250 Schizoaffective disorder, bipolar type: Principal | ICD-10-CM

## 2017-07-09 DIAGNOSIS — F1221 Cannabis dependence, in remission: Secondary | ICD-10-CM | POA: Clinically undetermined

## 2017-07-09 DIAGNOSIS — F419 Anxiety disorder, unspecified: Secondary | ICD-10-CM

## 2017-07-09 MED ORDER — LORAZEPAM 2 MG/ML IJ SOLN: 1.0000 mg | Freq: Four times a day (QID) | INTRAMUSCULAR | Status: DC | PRN

## 2017-07-09 MED ORDER — QUETIAPINE FUMARATE 50 MG PO TABS: 50.0000 mg | ORAL_TABLET | Freq: Two times a day (BID) | ORAL | Status: DC | PRN

## 2017-07-09 MED ORDER — LORAZEPAM 1 MG PO TABS: 1.0000 mg | ORAL_TABLET | Freq: Four times a day (QID) | ORAL | Status: DC | PRN

## 2017-07-09 MED ORDER — DIVALPROEX SODIUM ER 250 MG PO TB24
1250.0000 mg | ORAL_TABLET | Freq: Every day | ORAL | Status: DC
  Administered 2017-07-09 – 2017-07-16 (×8): 1250 mg via ORAL
  Filled 2017-07-09 (×10): qty 5

## 2017-07-09 NOTE — [facility] Suicide Risk Assessment (Signed)
[facility] INPATIENT:  Family/Significant Other Suicide Prevention Education  Suicide Prevention Education:  Education Completed; No one  has been identified by the patient as the family member/significant other with whom the patient will be residing, and identified as the person(s) who will aid the patient in the event of a mental health crisis (suicidal ideations/suicide attempt).  With written consent from the patient, the family member/significant other has been provided the following suicide prevention education, prior to the and/or following the discharge of the patient.  The suicide prevention education provided includes the following:  Suicide risk factors  Suicide prevention and interventions  National Suicide Hotline telephone number  [facility] assessment telephone number  [facility] Emergency Assistance 911  [facility] and/or Residential Mobile Crisis Unit telephone number  Request made of family/significant other to:  Remove weapons (e.g., guns, rifles, knives), all items previously/currently identified as safety concern.    Remove drugs/medications (over-the-counter, prescriptions, illicit drugs), all items previously/currently identified as a safety concern.  The family member/significant other verbalizes understanding of the suicide prevention education information provided.  The family member/significant other agrees to remove the items of safety concern listed above. The patient did not endorse SI at the time of admission, nor did the patient c/o SI during the stay here.  SPE not required. However, I did talk to mother, Albert [name], [phone], who indicated he stopped taking meds, and then when she tried to help him resume, he was convinced they were poison.  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  She is hopeful that he will agree to resume with the ACT team.  Albert Rogueodney B Albert [name] 07/09/2017, 10:47 AM

## 2017-07-09 NOTE — Plan of Care (Signed)
Problem: Health Behavior/Discharge Planning: Goal: Compliance with prescribed medication regimen will improve Outcome: Progressing Patient is compliant with medication regimen.   

## 2017-07-09 NOTE — BHH Counselor (Signed)
Adult Comprehensive Assessment  Patient ID: Albert [name], male   DOB: Dec 22, 1975, 41 y.o.   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 Substance abuse: Attends AA daily  Living/Environment/Situation:  Living Arrangements: Alone Living conditions (as described by patient or guardian): Good How long has patient lived in current situation?: Since 2007 What is atmosphere in current home: Comfortable  Family History:  Marital status: Single Does patient have children?: No  Childhood History:  By whom was/is the patient raised?: Both parents Additional childhood history information: Mother mainly took care of [place] Description of patient's relationship with caregiver when they were a child: Good Patient's description of current relationship with people who raised him/her: Good Does patient have siblings?: Yes Number of Siblings: 3 (Has a twin sister and a half brother/sister) Description of patient's current relationship with siblings: Pretty good Did patient suffer any verbal/emotional/physical/sexual abuse as a child?: No Did patient suffer from severe childhood neglect?: No Has patient ever been sexually abused/assaulted/raped as an adolescent or adult?: No Was the patient ever a victim of a crime or a disaster?: No Witnessed domestic violence?: No Has patient been effected by domestic violence as an adult?: No  Education:  Highest grade of school patient has completed: 12th grade Currently a student?: No Learning disability?: No  Employment/Work Situation:  Employment situation: On disability Why is patient on disability: [employer]olar/Depression How long has patient been on disability: about 5 years Patient's job has been impacted by current illness: No What is the longest time patient has a held a job?: 3 years 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 Has patient ever been in the [employer]?: No Has patient ever served in Buyer, retail?: No  Financial Resources:  Surveyor, quantity resources: Insurance claims handler Does patient have a Lawyer or guardian?: No  Alcohol/Substance Abuse:  What has been your use of drugs/alcohol within the last 12 months?: None If attempted suicide, did drugs/alcohol play a role in this?: No Alcohol/Substance Abuse Treatment Hx: Denies past history If yes, describe treatment: N/A Has alcohol/substance abuse ever caused legal problems?: No  Social Support System: Conservation officer, nature Support System: Good Describe Community Support System: Family Type of faith/religion: Cath[place]ic How does patient's faith help to cope with current illness?: Unable to say  Leisure/Recreation:  Leisure and Hobbies: Bike riding, Orthoptist, conversations with others  Strengths/Needs:  What things does the patient do well?: Honesty, loyalty and integrity. Learning to have good human relations In what areas does patient struggle / problems for patient: Dealing with people that are pushy and that rush me  Discharge Plan:  Does patient have access to transportation?: Yes Will patient be returning to same living situation after discharge?: Yes Currently receiving community mental health services: Yes (From Whom) [facility]; Gretchen Short If no, would patient like referral for services when discharged?: Does patient have financial barriers related to discharge medications?: No    Summary/Recommendations:   Summary and Recommendations (to be completed by the evaluator): Albert [name] is a 41 YO Caucasian male diagnosed with Schizophrenia.  He presents voluntarily with paranoia, disorganization and some resulting degree of anxiety due to "weaning myself down on my medications."  This is the same presentation as the last time he was with [place] in March of 2018. Albert [name] lives independantly in the  community and follows up with Dr Betti Cruz and [facility], as well as regular  AA meetings. Wea re hoping that he will agree to follow up with an ACT team after this hospitalization.  Albert OhmChris can benefit from crises stabilization, medication management, therapeutic milieu and referral for services.  Albert [name]. 07/09/2017

## 2017-07-09 NOTE — B[facility] Suicide Risk Assessment (Signed)
[facility]   Nursing information obtained from:  Patient Demographic factors:  Male, Caucasian Current Mental Status:  NA Loss Factors:  NA Historical Factors:  Family history of mental illness or substance abuse Risk Reduction Factors:  NA  Total Time spent with patient: 30 minutes Principal Problem: Schizoaffective disorder, bipolar type (HCC) Diagnosis:   Patient Active Problem List   Diagnosis Date Noted  . Schizoaffective disorder, bipolar type (HCC) [F25.0] 01/04/2017    Priority: High  . Alcohol use disorder, moderate, in sustained remission (HCC) [F10.21] 07/09/2017  . Cannabis use disorder, moderate, in sustained remission (HCC) [F12.21] 07/09/2017  . Elevated TSH [R94.6] 01/05/2017  . Tachycardia [R00.0] 01/05/2017  . Alcoholism (HCC) [F10.20]   . Drug abuse [F19.10]   . Medical non-compliance [Z91.19]    Subjective Data: Please see H&P.   Continued Clinical Symptoms:  Alcohol Use Disorder Identification Test Final Score (AUDIT): 0 The "Alcohol Use Disorders Identification Test", Guidelines for Use in Primary Care, Second Edition.  World Science writerHealth Organization [facility](WHO). Score between 0-7:  no or low risk or alcohol related problems. Score between 8-15:  moderate risk of alcohol related problems. Score between 16-19:  high risk of alcohol related problems. Score 20 or above:  warrants further diagnostic evaluation for alcohol dependence and treatment.   CLINICAL FACTORS:   Currently Psychotic Unstable or Poor Therapeutic Relationship Previous Psychiatric Diagnoses and Treatments   Musculoskeletal: Strength & Muscle Tone: within normal limits Gait & Station: normal Patient leans: N/A  Psychiatric Specialty Exam: Physical Exam  ROS  Blood pressure 123/89, pulse (!) 103, temperature 98.5 F (36.9 C), temperature source Oral, resp. rate 20, height 5\' 10"  (1.778 m), weight 100.2 kg (221 lb).Body mass index is 31.71 kg/m.                             Please see H&P.                               COGNITIVE FEATURES THAT CONTRIBUTE TO RISK:  Closed-mindedness, Polarized thinking and Thought constriction (tunnel vision)    SUICIDE RISK:   Moderate:  Frequent suicidal ideation with limited intensity, and duration, some specificity in terms of plans, no associated intent, good self-control, limited dysphoria/symptomatology, some risk factors present, and identifiable protective factors, including available and accessible social support.  PLAN OF CARE: Please see H&P.   I certify that inpatient services furnished can reasonably be expected to improve the patient's condition.   Shaletta Hinostroza, MD 07/09/2017, 1:56 PM
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
## 2017-07-09 NOTE — Progress Notes (Signed)
Adult Psychoeducational Group Note  Date:  07/09/2017 Time:  9:22 PM  Group Topic/Focus:  Wrap-Up Group:   The focus of this group is to help patients review their daily goal of treatment and discuss progress on daily workbooks.  Participation Level:  Active  Participation Quality:  Appropriate  Affect:  Appropriate  Cognitive:  Alert  Insight: Appropriate  Engagement in Group:  Engaged  Modes of Intervention:  Discussion  Additional Comments:  Patient stated having an excellent day. Patient's goal for today was "growth and self discovery".   Aneri Slagel L Syaire Saber 07/09/2017, 9:22 PM

## 2017-07-09 NOTE — BHH Group Notes (Signed)
LCSW Group Therapy Note   07/09/2017 1:15pm   Type of Therapy and Topic:  Group Therapy:  Positive Affirmations   Participation Level:  Active  Description of Group: This group addressed positive affirmation toward self and others. Patients went around the room and identified two positive things about themselves and two positive things about a peer in the room. Patients reflected on how it felt to share something positive with others, to identify positive things about themselves, and to hear positive things from others. Patients were encouraged to have a daily reflection of positive characteristics or circumstances.  Therapeutic Goals 1. Patient will verbalize two of their positive qualities 2. Patient will demonstrate empathy for others by stating two positive qualities about a peer in the group 3. Patient will verbalize their feelings when voicing positive self affirmations and when voicing positive affirmations of others 4. Patients will discuss the potential positive impact on their wellness/recovery of focusing on positive traits of self and others. Summary of Patient Progress:  Stayed the entire time, engaged throughout.  Graphically described his extreme paranoia in the ED last night when he was sure that he was laying in a casket in preparation for burial, and staff was setting up the room in such a way that he would fall if he got up and walked around.  Described "Aurther Lofterry" who came to him and was trying to trick him, but really Aurther Lofterry is a supportive person.  Therapeutic Modalities Cognitive Behavioral Therapy Motivational Interviewing  Albert RogueRodney B Zavian [name], [place]CS[place] 07/09/2017 3:09 PM

## 2017-07-09 NOTE — Progress Notes (Signed)
DAR NOTE: Patient presents with anxious affect and mood.  Denies pain, auditory and visual hallucinations.  Patient presents with hostile speech towards a male staff during room change.  Patient grabbed the name card from staff's hand and said "what are you doing to my name" with  an aggressive posture.  Described energy level as normal and concentration as good.  Rates depression at 0, hopelessness at 0, and anxiety at 5.  Maintained on routine safety checks.  Medications given as prescribed.  Support and encouragement offered as needed.  Attended group and participated.  States goal for today is "myself."  Patient visible in the dayroom watching TV with minimal interaction with staff and peers.

## 2017-07-09 NOTE — Progress Notes (Addendum)
Recreation Therapy Notes  INPATIENT RECREATION THERAPY ASSESSMENT  Patient Details Name: [name] MRN: [id number] DOB: 01/31/1976 Today's Date: 07/09/2017  Patient Stressors: Other (Comment) (General anxiety, Overthink things)  Pt stated he was here because he thought people were going to dump him in a dumpster and leave him there.  Coping Skills:   Arguments, Exercise, Art/Dance, Talking, Music, Sports  Personal Challenges: Anger, Relationships  Leisure Interests (2+):  Individual - TV, Music - Listen, Individual - Other (Comment) (Youtube)  Awareness of Community Resources:  Yes  Community Resources:  Coffee Shop  Current Use: Yes  Patient Strengths:  Like to talk to people; can be creative  Patient Identified Areas of Improvement:  Relationship with dad; learn to connect with people at [employer] meetings  Current Recreation Participation:  Everyday  Patient Goal for Hospitalization:  "Talk to you a lot, build up self and love self more"  [place]  [place]  Current [place] (including self-harm):  No  Current HI:  No  Consent to Intern Participation: N/A  Caroll RancherMarjette Xin Klawitter, LRT/CTRS  Caroll RancherLindsay, Wonda Goodgame A 07/09/2017, 2:15 PM

## 2017-07-09 NOTE — Progress Notes (Signed)
Recreation Therapy Notes  Date: 07/09/17 Time: 1000 Location: 500 Hall Dayroom  Group Topic: Leisure Education, Goal Setting  Goal Area(s) Addresses:  Patient will be able to identify at least 3 goals for life participation.  Patient will be able to identify benefit of investing in life goals.  Patient will be able to identify benefit of setting life goals.   Behavioral Response:  Engaged  Intervention: Pencils, goals worksheet  Activity: Life Goals.  Patients were given [name] worksheet with six categories (family, friends, work/school, body, spirituality and mental health).  For each category, patients were to identify what they were doing well, what they needed to improve and set [name] goal for making the improvement.  Education:  Discharge Planning, PharmacologistCoping Skills, Leisure Education   Education Outcome: Acknowledges Education/In Group Clarification Provided/Needs Additional Education  Clinical Observations:  Pt expressed "goals give you purpose".  Pt had [name] hard time staying focused at points in group but was able to be redirected.  For spirituality, pt stated he was catholic, needed to improve learning humility and made [name] goal of God helping him learn.  Pt stated for family, he does well at talking to his mom, needs to improve by building [name] "loving and warm family".  Pt goal for family was to have game night once [name] week.  Lastly, for friends, pt stated he does well at keeping in touch with them, needs to improve spending more time with them and set [name] goal of going to meetings and starting up conversations.   Caroll RancherMarjette Rosaline [name], LRT/CTRS         Caroll RancherLindsay, Albert [name] 07/09/2017 11:41 AM

## 2017-07-09 NOTE — Progress Notes (Signed)
Nursing Progress Note: 7p-7a D: Pt currently presents with a tangential/anxious affect and behavior. Pt states "I had a great day. I just can't get over how much I've learned today." Not interacting  with the milieu. Pt reports fair sleep during the previous night with current medication regimen. Pt did not attend wrap-up group.  A: Pt provided with medications per providers orders. Pt's labs and vitals were monitored throughout the night. Pt supported emotionally and encouraged to express concerns and questions. Pt educated on medications.  R: Pt's safety ensured with 15 minute and environmental checks. Pt currently denies SI, HI, and AVH. Pt verbally contracts to seek staff if SI,HI, or AVH occurs and to consult with staff before acting on any harmful thoughts. Will continue to monitor.

## 2017-07-10 LAB — VALPROIC ACID LEVEL: VALPROIC ACID LVL: 67 ug/mL (ref 50.0–100.0)

## 2017-07-10 MED ORDER — PROPRANOLOL HCL 10 MG PO TABS
10.0000 mg | ORAL_TABLET | Freq: Two times a day (BID) | ORAL | Status: DC
  Administered 2017-07-10 – 2017-07-17 (×14): 10 mg via ORAL
  Filled 2017-07-10 (×18): qty 1

## 2017-07-10 NOTE — Progress Notes (Signed)
Adult Psychoeducational Group Note  Date:  07/10/2017 Time:  10:16 PM  Group Topic/Focus:  Wrap-Up Group:   The focus of this group is to help patients review their daily goal of treatment and discuss progress on daily workbooks.  Participation Level:  Active  Participation Quality:  Appropriate  Affect:  Appropriate  Cognitive:  Alert  Insight: Appropriate  Engagement in Group:  Engaged  Modes of Intervention:  Discussion  Additional Comments: Patient stated having a pretty good day. Patient's goal for today was to learn and grow. Patient met goal.  [name] 07/10/2017, 10:16 PM

## 2017-07-10 NOTE — Progress Notes (Signed)
DAR NOTE: Patient presents with anxious affect and irritable  mood. Pt at some point from nowhere started calling staff liar without any reason. Pt has been labile, guarded and sometime just stares at staff without saying a word. As per self inventory, pt had a good night sleep, good appetite, normal energy and good concentration. Denies pain, auditory and visual hallucinations.  Rates depression at 0, hopelessness at 0, and anxiety at 5.  Maintained on routine safety checks.  Medications given as prescribed.  Support and encouragement offered as needed.  Attended group and participated.  States goal for today is " growth and self discovery."  Patient observed socializing with peers in the dayroom, will continue to monitor.

## 2017-07-10 NOTE — Tx Team (Signed)
Interdisciplinary Treatment and Diagnostic Plan Update  07/10/2017 Time of Session: 8:57 AM  Dahmir Epperly MRN: [id number]  Principal Diagnosis: Schizoaffective disorder, bipolar type (HCC)  Secondary Diagnoses: Principal Problem:   Schizoaffective disorder, bipolar type (HCC) Active Problems:   Alcohol use disorder, moderate, in sustained remission (HCC)   Cannabis use disorder, moderate, in sustained remission (HCC)   Current Medications:  Current Facility-Administered Medications  Medication Dose Route Frequency Provider Last Rate Last Dose  . acetaminophen (TYLENOL) tablet 650 mg  650 mg Oral Q6H PRN Okonkwo, Justina A, NP      . alum & mag hydroxide-simeth (MAALOX/MYLANTA) 200-200-20 MG/5ML suspension 30 mL  30 mL Oral Q4H PRN Okonkwo, Justina A, NP      . divalproex (DEPAKOTE ER) 24 hr tablet 1,250 mg  1,250 mg Oral QHS Eappen, Saramma, MD   1,250 mg at 07/09/17 2339  . gabapentin (NEURONTIN) capsule 300 mg  300 mg Oral [id number] Okonkwo, Justina A, NP   300 mg at 07/10/17 0808  . hydrOXYzine (ATARAX/VISTARIL) tablet 25 mg  25 mg Oral TID PRN Okonkwo, Justina A, NP      . LORazepam (ATIVAN) tablet 1 mg  1 mg Oral Q6H PRN Jomarie Longs, MD       Or  . LORazepam (ATIVAN) injection 1 mg  1 mg Intramuscular Q6H PRN Eappen, Saramma, MD      . magnesium hydroxide (MILK OF MAGNESIA) suspension 30 mL  30 mL Oral Daily PRN Okonkwo, Justina A, NP      . pantoprazole (PROTONIX) EC tablet 40 mg  40 mg Oral BID AC Okonkwo, Justina A, NP   40 mg at 07/10/17 0616  . QUEtiapine (SEROQUEL) tablet 200 mg  200 mg Oral QHS Okonkwo, Justina A, NP   200 mg at 07/09/17 2340  . QUEtiapine (SEROQUEL) tablet 50 mg  50 mg Oral BID PRN Jomarie Longs, MD        PTA Medications: Prescriptions Prior to Admission  Medication Sig Dispense Refill Last Dose  . benztropine (COGENTIN) 0.5 MG tablet Take 1 tablet (0.5 mg total) by mouth 2 (two) times daily. (Patient not taking: Reported on 07/04/2017) 60  tablet 0 Not Taking at Unknown time  . divalproex (DEPAKOTE ER) 250 MG 24 hr tablet Take 5 tablets (1,250 mg total) by mouth at bedtime. (Patient not taking: Reported on 07/04/2017) 150 tablet 0 Not Taking at Unknown time  . divalproex (DEPAKOTE ER) 500 MG 24 hr tablet Take 1,500 mg by mouth at bedtime.   Past Week at Unknown time  . gabapentin (NEURONTIN) 300 MG capsule Take 1 capsule (300 mg total) by mouth 3 (three) times daily at 8am, 3pm and bedtime. (Patient not taking: Reported on 07/04/2017) 90 capsule 0 Not Taking at Unknown time  . gabapentin (NEURONTIN) 300 MG capsule Take 1 capsule (300 mg total) by mouth 2 (two) times daily. (Patient not taking: Reported on 07/07/2017) 60 capsule 0 Not Taking at Unknown time  . hydrOXYzine (ATARAX/VISTARIL) 25 MG tablet Take 1 tablet (25 mg total) by mouth every 6 (six) hours as needed for anxiety. (Patient not taking: Reported on 07/04/2017) 30 tablet 0 Not Taking at Unknown time  . hydrOXYzine (ATARAX/VISTARIL) 25 MG tablet Take 1 tablet (25 mg total) by mouth every 6 (six) hours as needed for anxiety. (Patient not taking: Reported on 07/07/2017) 30 tablet 0 Not Taking at Unknown time  . pantoprazole (PROTONIX) 40 MG tablet Take 1 tablet (40 mg total) by mouth 2 (two) times  daily before a meal. (Patient not taking: Reported on 07/07/2017) 60 tablet 0 Not Taking at Unknown time  . propranolol (INDERAL) 10 MG tablet Take 1 tablet (10 mg total) by mouth 3 (three) times daily. (Patient not taking: Reported on 07/04/2017) 90 tablet 0 Not Taking at Unknown time  . QUEtiapine (SEROQUEL XR) 150 MG 24 hr tablet Take 5 tablets (750 mg total) by mouth at bedtime. (Patient not taking: Reported on 07/07/2017) 150 tablet 0 Not Taking at Unknown time  . QUEtiapine (SEROQUEL) 200 MG tablet Take 1 tablet (200 mg total) by mouth at bedtime. 60 tablet 0 Past Week at Unknown time  . zolpidem (AMBIEN) 5 MG tablet Take 1 tablet (5 mg total) by mouth at bedtime as needed for sleep. (Patient not  taking: Reported on 07/04/2017) 7 tablet 0 Not Taking at Unknown time    Patient Stressors: Marital or family conflict Medication change or noncompliance  Patient Strengths: Astronomer fund of knowledge Physical Health  Treatment Modalities: Medication Management, Group therapy, Case management,  1 to 1 session with clinician, Psychoeducation, Recreational therapy.   Physician Treatment Plan for Primary Diagnosis: Schizoaffective disorder, bipolar type (HCC) Long Term Goal(s): Improvement in symptoms so as ready for discharge  Short Term Goals: Ability to verbalize feelings will improve Compliance with prescribed medications will improve Ability to verbalize feelings will improve Compliance with prescribed medications will improve  Medication Management: Evaluate patient's response, side effects, and tolerance of medication regimen.  Therapeutic Interventions: 1 to 1 sessions, Unit Group sessions and Medication administration.  Evaluation of Outcomes: Progressing  Physician Treatment Plan for Secondary Diagnosis: Principal Problem:   Schizoaffective disorder, bipolar type (HCC) Active Problems:   Alcohol use disorder, moderate, in sustained remission (HCC)   Cannabis use disorder, moderate, in sustained remission (HCC)   Long Term Goal(s): Improvement in symptoms so as ready for discharge  Short Term Goals: Ability to verbalize feelings will improve Compliance with prescribed medications will improve Ability to verbalize feelings will improve Compliance with prescribed medications will improve  Medication Management: Evaluate patient's response, side effects, and tolerance of medication regimen.  Therapeutic Interventions: 1 to 1 sessions, Unit Group sessions and Medication administration.  Evaluation of Outcomes: Progressing   RN Treatment Plan for Primary Diagnosis: Schizoaffective disorder, bipolar type (HCC) Long Term Goal(s): Knowledge of disease and  therapeutic regimen to maintain health will improve  Short Term Goals: Ability to identify and develop effective coping behaviors will improve and Compliance with prescribed medications will improve  Medication Management: RN will administer medications as ordered by provider, will assess and evaluate patient's response and provide education to patient for prescribed medication. RN will report any adverse and/or side effects to prescribing provider.  Therapeutic Interventions: 1 on 1 counseling sessions, Psychoeducation, Medication administration, Evaluate responses to treatment, Monitor vital signs and CBGs as ordered, Perform/monitor CIWA, COWS, AIMS and Fall Risk screenings as ordered, Perform wound care treatments as ordered.  Evaluation of Outcomes: Progressing   LCSW Treatment Plan for Primary Diagnosis: Schizoaffective disorder, bipolar type (HCC) Long Term Goal(s): Safe transition to appropriate next level of care at discharge, Engage patient in therapeutic group addressing interpersonal concerns.  Short Term Goals: Engage patient in aftercare planning with referrals and resources  Therapeutic Interventions: Assess for all discharge needs, 1 to 1 time with Social worker, Explore available resources and support systems, Assess for adequacy in community support network, Educate family and significant other(s) on suicide prevention, Complete Psychosocial Assessment, Interpersonal group therapy.  Evaluation of Outcomes: Progressing  Will return home, [facility]  Information sent to them yesterday.  Pt states he is reluctant to give up Dr Betti Cruzeddy   Progress in Treatment: Attending groups: Yes Participating in groups: Yes Taking medication as prescribed: Yes Toleration medication: Yes, no side effects reported at this time Family/Significant other contact made: Yes Patient understands diagnosis: No  Limited insight Discussing patient identified  problems/goals with staff: Yes Medical problems stabilized or resolved: Yes Denies suicidal/homicidal ideation: Yes Issues/concerns per patient self-inventory: None Other: N/A  New problem(s) identified: None identified at this time.   New Short Term/Long Term Goal(s): "I've been really anxious.  I pace a lot. I was taking Seroquel but it wasn't doing anything for me.  Dr Betti Cruzeddy told me to stop taking it."   Discharge Plan or Barriers:   Reason for Continuation of Hospitalization: Disorganization Anxiety Paranoia Medication stabilization   Estimated Length of Stay: 07/15/17  Attendees: Patient: Reginal LutesChris Balderston 07/10/2017  8:57 AM  Physician: Jomarie LongsSaramma Eappen, MD 07/10/2017  8:57 AM  Nursing: Roddie McElizabeth Awofadeju, RN 07/10/2017  8:57 AM  RN Care Manager: Onnie BoerJennifer Clark, RN 07/10/2017  8:57 AM  Social Worker: Richelle Itood Jenin Birdsall 07/10/2017  8:57 AM  Recreational Therapist: Aggie CosierMarjette Lindsey 07/10/2017  8:57 AM  Other: Tomasita Morrowelora Sutton 07/10/2017  8:57 AM  Other:  07/10/2017  8:57 AM    Scribe for Treatment Team:  Daryel Geraldodney Kortland Nichols LCSW 07/10/2017 8:57 AM

## 2017-07-10 NOTE — Progress Notes (Signed)
Recreation Therapy Notes  Date: 07/10/17 Time: 1000 Location: 500 Hall Dayroom  Group Topic: Stress Management  Goal Area(s) Addresses:  Patient will verbalize importance of using healthy stress management.  Patient will identify positive emotions associated with healthy stress management.   Behavioral Response: Engaged  Intervention: Stress Management  Activity :  Progressive Muscle Relaxation and Guided Imagery.  LRT introduced the stress management techniques of progressive muscle relaxation and guided imagery.  LRT explained the process of each technique before practicing them.  Patients were to follow along as the scripts were read to fully engage in each technique.  Education:  Stress Management, Discharge Planning.   Education Outcome: Acknowledges edcuation/In group clarification offered/Needs additional education  Clinical Observations/Feedback: Pt stated he talks to himself when he is stressed.  Pt seemed to have a hard time focusing on the activities even though he expressed enjoying both.  During processing, pt began talking about forgetting the past and was focused on the future.  Pt also stated he did want to be alone and wanted something to do.  After group, LRT made copies of coloring sheets and word searches for pt.     Caroll RancherMarjette Kacia Halley, LRT/CTRS         Caroll RancherLindsay, Kanasia Gayman A 07/10/2017 12:28 PM

## 2017-07-10 NOTE — [facility] Group Notes (Signed)
LCSW Group Therapy Note  07/10/2017 1:15pm  Type of Therapy/Topic:  Group Therapy:  Balance in Life  Participation Level:  Active  Description of Group:    This group will address the concept of balance and how it feels and looks when one is unbalanced. Patients will be encouraged to process areas in their lives that are out of balance and identify reasons for remaining unbalanced. Facilitators will guide patients in utilizing problem-solving interventions to address and correct the stressor making their life unbalanced. Understanding and applying boundaries will be explored and addressed for obtaining and maintaining a balanced life. Patients will be encouraged to explore ways to assertively make their unbalanced needs known to significant others in their lives, using other group members and facilitator for support and feedback.  Therapeutic Goals: 1. Patient will identify two or more emotions or situations they have that consume much of in their lives. 2. Patient will identify signs/triggers that life has become out of balance:  3. Patient will identify two ways to set boundaries in order to achieve balance in their lives:  4. Patient will demonstrate ability to communicate their needs through discussion and/or role plays  Summary of Patient Progress:  Albert [name] attended group and was there the entire time.  He was an active participant in the group discussion.  He had limited insight into how he experiences unbalance in his life.  He states that "it is nobody's business".  Albert [name] uses prayer and talking with people he trusts to keep balance in his life.         Therapeutic Modalities:   Cognitive Behavioral Therapy Solution-Focused Therapy Assertiveness Training  Aram Beechamngel M Cuahutemoc Attar, Student-Social Work 07/10/2017 1:32 PM

## 2017-07-10 NOTE — Progress Notes (Addendum)
[facility] MD Progress Note  07/10/2017 2:19 PM Albert [name]  MRN:  [id number] Subjective: Patient states " get out ."  Objective:Patient seen and chart reviewed.Discussed patient with treatment team. Pt this AM noted as irritable , labile . Pt continues to be paranoid , as well as likely responding to internal stimuli. Writer discussed an LAI with pt , possibly changing his seroquel to another antipsychotic that comes in an LAI form. Pt got upset all of a sudden , started cursing at Clinical research associate and Chiropractor to get out. Per RN , pt appears labile, guarded and paranoid often. Has been compliant with medications.   VS reviewed - pt noted as tachycardic   Principal Problem: Schizoaffective disorder, bipolar type (HCC) Diagnosis:   Patient Active Problem List   Diagnosis Date Noted  . Schizoaffective disorder, bipolar type (HCC) [F25.0] 01/04/2017    Priority: High  . Alcohol use disorder, moderate, in sustained remission (HCC) [F10.21] 07/09/2017  . Cannabis use disorder, moderate, in sustained remission (HCC) [F12.21] 07/09/2017  . Elevated TSH [R94.6] 01/05/2017  . Tachycardia [R00.0] 01/05/2017  . Alcoholism (HCC) [F10.20]   . Drug abuse [F19.10]   . Medical non-compliance [Z91.19]    Total Time spent with patient: 25 minutes  Past Psychiatric History: Please see H&P.   Past Medical History:  Past Medical History:  Diagnosis Date  . Alcoholism (HCC)   . Drug abuse    xanax addiction 5 years ago  . Medical non-compliance   . Paranoid schizophrenia (HCC)   . Seizures (HCC)    History reviewed. No pertinent surgical history. Family History:  Family History  Problem Relation Age of Onset  . Depression Maternal Aunt    Family Psychiatric  History: Please see H&P.  Social History:  History  Alcohol Use No    Comment: 11 months sober in March 2018     History  Drug Use No    Social History   Social History  . Marital status: Single    Spouse name: N/A  . Number  of children: N/A  . Years of education: N/A   Social History Main Topics  . Smoking status: Former Games developer  . Smokeless tobacco: Never Used     Comment: Non-smoker  . Alcohol use No     Comment: 11 months sober in March 2018  . Drug use: No  . Sexual activity: Not Currently   Other Topics Concern  . None   Social History Narrative  . None   Additional Social History:                         Sleep: Fair  Appetite:  Fair  Current Medications: Current Facility-Administered Medications  Medication Dose Route Frequency Provider Last Rate Last Dose  . acetaminophen (TYLENOL) tablet 650 mg  650 mg Oral Q6H PRN Okonkwo, Justina A, NP      . alum & mag hydroxide-simeth (MAALOX/MYLANTA) 200-200-20 MG/5ML suspension 30 mL  30 mL Oral Q4H PRN Okonkwo, Justina A, NP      . divalproex (DEPAKOTE ER) 24 hr tablet 1,250 mg  1,250 mg Oral QHS Cyndra Feinberg, MD   1,250 mg at 07/09/17 2339  . gabapentin (NEURONTIN) capsule 300 mg  300 mg Oral [id number] Okonkwo, Justina A, NP   300 mg at 07/10/17 0808  . hydrOXYzine (ATARAX/VISTARIL) tablet 25 mg  25 mg Oral TID PRN Beryle Lathe, Justina A, NP      . LORazepam (ATIVAN) tablet 1  mg  1 mg Oral Q6H PRN Jomarie Longs, MD       Or  . LORazepam (ATIVAN) injection 1 mg  1 mg Intramuscular Q6H PRN Josemaria Brining, MD      . magnesium hydroxide (MILK OF MAGNESIA) suspension 30 mL  30 mL Oral Daily PRN Okonkwo, Justina A, NP      . pantoprazole (PROTONIX) EC tablet 40 mg  40 mg Oral BID AC Okonkwo, Justina A, NP   40 mg at 07/10/17 0616  . propranolol (INDERAL) tablet 10 mg  10 mg Oral BID Allaya Abbasi, MD      . QUEtiapine (SEROQUEL) tablet 200 mg  200 mg Oral QHS Okonkwo, Justina A, NP   200 mg at 07/09/17 2340  . QUEtiapine (SEROQUEL) tablet 50 mg  50 mg Oral BID PRN Jomarie Longs, MD        Lab Results:  Results for orders placed or performed during the hospital encounter of 07/08/17 (from the past 48 hour(s))  Valproic acid level      Status: None   Collection Time: 07/10/17  6:38 AM  Result Value Ref Range   Valproic Acid Lvl 67 50.0 - 100.0 ug/mL    Comment: Performed at [facility], [address], [place], [place] [address]    Blood Alcohol level:  Lab Results  Component Value Date   [facility] <5 07/07/2017   ETH <5 07/04/2017    Metabolic Disorder Labs: Lab Results  Component Value Date   HGBA1C 5.1 01/05/2017   MPG 100 01/05/2017   Lab Results  Component Value Date   PROLACTIN 30.4 (H) 01/06/2017   PROLACTIN 13.1 01/05/2017   Lab Results  Component Value Date   CHOL 172 01/05/2017   TRIG 112 01/05/2017   HDL 41 01/05/2017   CHOLHDL 4.2 01/05/2017   VLDL 22 01/05/2017   LDLCALC 109 (H) 01/05/2017    Physical Findings: AIMS: Facial and Oral Movements Muscles of Facial Expression: None, normal Lips and Perioral Area: None, normal Jaw: None, normal Tongue: None, normal,Extremity Movements Upper (arms, wrists, hands, fingers): None, normal Lower (legs, knees, ankles, toes): None, normal, Trunk Movements Neck, shoulders, hips: None, normal, Overall Severity Severity of abnormal movements (highest score from questions above): None, normal Incapacitation due to abnormal movements: None, normal Patient's awareness of abnormal movements (rate only patient's report): No Awareness, Dental Status Current problems with teeth and/or dentures?: No Does patient usually wear dentures?: No  CIWA:    COWS:     Musculoskeletal: Strength & Muscle Tone: within normal limits Gait & Station: normal Patient leans: N/A  Psychiatric Specialty Exam: Physical Exam  Nursing note and vitals reviewed.   Review of Systems  Psychiatric/Behavioral: The patient is nervous/anxious.   All other systems reviewed and are negative.   Blood pressure 117/76, pulse (!) 121, temperature 98.1 F (36.7 C), temperature source Oral, resp. rate 18, height  (1.778 m), weight 100.2 kg (221 lb).Body mass  index is 31.71 kg/m.  General Appearance: Guarded  Eye Contact:  Fair  Speech:  Normal Rate  Volume:  Increased  Mood:  Angry  Affect:  Labile  Thought Process:  Goal Directed and Descriptions of Associations: Circumstantial  Orientation:  Other:  self, situation  Thought Content:  Hallucinations: Auditory and Paranoid Ideation, seen as responding to internal stimuli on and off   Suicidal Thoughts:  No  Homicidal Thoughts:  No  Memory:  Immediate;   Fair Recent;   Fair Remote;  Fair  Judgement:  Impaired  Insight:  Lacking  Psychomotor Activity:  Increased and Restlessness  Concentration:  Concentration: Poor and Attention Span: Poor  Recall:  FiservFair  Fund of Knowledge:  Fair  Language:  Fair  Akathisia:  No  Handed:  Right  AIMS (if indicated):     Assets:  Desire for Improvement  ADL's:  Intact  Cognition:  WNL  Sleep:  Number of Hours: 6     Treatment Plan Summary:Patient with schizoaffective do , noncompliance with medications , presented as psychotic , racing thoughts and mood swings , continues to be irritable and easily agitated. Pt declines medication changes , will continue to educate and treat.  Daily contact with patient to assess and evaluate symptoms and progress in treatment, Medication management and Plan see below  Depakote ER 1250 mg po daily at bedtime for mood lability. Depakote level - 67 ( therapeutic) , will repeat 07/13/2017. Neurontin 300 mg PO bid for anxiety sx. Seroquel 200 mg po qhs for psychosis, mood sx, insomnia. Pt noted with increased HR , will add Propranolol 10 mg po bid . Order EKG for qtc monitoring. CSW will continue to work on disposition.   Demetrus Pavao, MD 07/10/2017, 2:19 PM

## 2017-07-11 DIAGNOSIS — R000 Tachycardia, unspecified: Secondary | ICD-10-CM

## 2017-07-11 DIAGNOSIS — Z87891 Personal history of nicotine dependence: Secondary | ICD-10-CM

## 2017-07-11 NOTE — Progress Notes (Signed)
Adult Psychoeducational Group Note  Date:  07/11/2017 Time:  8:44 PM  Group Topic/Focus:  Wrap-Up Group:   The focus of this group is to help patients review their daily goal of treatment and discuss progress on daily workbooks.  Participation Level:  Active  Participation Quality:  Appropriate  Affect:  Appropriate  Cognitive:  Appropriate  Insight: Appropriate  Engagement in Group:  Engaged  Modes of Intervention:  Discussion  Additional Comments: The patient expressed that he attended groups.The patient also said that he rates today 9.  Octavio Mannshigpen, Mirtha Jain Lee 07/11/2017, 8:44 PM

## 2017-07-11 NOTE — [facility] Group Notes (Signed)
LCSW Group Therapy Note   07/11/2017 1:15pm   Type of Therapy and Topic:  Group Therapy:  Positive Affirmations   Participation Level:  Minimal  Description of Group: This group addressed positive affirmation toward self and others. Patients went around the room and identified two positive things about themselves and two positive things about a peer in the room. Patients reflected on how it felt to share something positive with others, to identify positive things about themselves, and to hear positive things from others. Patients were encouraged to have a daily reflection of positive characteristics or circumstances.  Therapeutic Goals 1. Patient will verbalize two of their positive qualities 2. Patient will demonstrate empathy for others by stating two positive qualities about a peer in the group 3. Patient will verbalize their feelings when voicing positive self affirmations and when voicing positive affirmations of others 4. Patients will discuss the potential positive impact on their wellness/recovery of focusing on positive traits of self and others. Summary of Patient Progress:  Albert [name] attended group the entire time and actively listened to the story.  He chose not to share his own story with the group.   Therapeutic Modalities Cognitive Behavioral Therapy Motivational Interviewing  Carlynn Heraldngel M Zaeda Mcferran, Student-Social Work 07/11/2017 1:26 PM

## 2017-07-11 NOTE — Progress Notes (Signed)
DAR NOTE: Patient presents with anxious affect and depressed mood.  Denies pain, auditory and visual hallucinations.  Rates depression at 3, hopelessness at 2, and anxiety at 4.  Maintained on routine safety checks.  Medications given as prescribed.  Support and encouragement offered as needed.  Attended group and participated. Patient observed socializing with peers in the dayroom.  Offered no complaint.

## 2017-07-11 NOTE — Progress Notes (Signed)
DAR NOTE: Patient presents with anxious affect and depressed mood.  Denies pain, endorse passive auditory and visual hallucinations.  Rates depression at 2, hopelessness at 3, and anxiety at 6.  Maintained on routine safety checks.  Medications given as prescribed.  Support and encouragement offered as needed.  Attended group and participated. Patient observed socializing with peers in the dayroom.  Offered no complaint.

## 2017-07-11 NOTE — Progress Notes (Signed)
Recreation Therapy Notes  Date: 07/11/17 Time: 1000 Location: 500 Hall Dayroom  Group Topic: Communication, Team Building, Problem Solving  Goal Area(s) Addresses:  Patient will effectively work with peer towards shared goal.  Patient will identify skills used to make activity successful.  Patient will identify how skills used during activity can be used to reach post d/c goals.   Behavioral Response: Minimal  Intervention: STEM Activity  Activity: Landing Pad. In teams patients were given 12 plastic drinking straws and a length of masking tape. Using the materials provided patients were asked to build a landing pad to catch a golf ball dropped from approximately 6 feet in the air.   Education: Pharmacist, communityocial Skills, Discharge Planning   Education Outcome: Acknowledges education/In group clarification offered/Needs additional education.   Clinical Observations/Feedback: Pt was engaged in coming up with ideas but did not help make the landing pad.  Pt did show some frustration in making the pad.  Pt began wrapping tape around his head and being silly.  Pt was able to be redirected.   Caroll RancherMarjette Elice Crigger, LRT/CTRS         Lillia AbedLindsay, Kenn Rekowski A 07/11/2017 11:50 AM

## 2017-07-11 NOTE — Progress Notes (Signed)
[facility] MD Progress Note  07/11/2017 3:08 PM Albert [name]  MRN:  [id number]  Subjective: Albert [name] reports, "I'm feeling a lot better, learning a lot of coping skills. The medications are helping".  Objective: Patient seen and chart reviewed. Discussed patient with treatment team. Pt presents calm without any agitations or irritability today unlike yesterday whereby he was noted as irritable & labile. Pt continues to present paranoid & responding to internal stimuli. The attending MD discussed an LAI with pt, possibly changing his Seroquel to another antipsychotic that comes in an LAI form. Apparently, Pt got upset all of a sudden, started cursing at the MD and asked the MD to get out. Today, he presents reasonable. He is participating in group sessions, went out to the court to play basketball with other patient this morning. He says he is tolerating his medication regimen well. Denies any adverse effects from medications. Patient continues to present as responding to some internal stimuli. He does not appear to be in any distress.  VS reviewed - pt noted as tachycardic   Principal Problem: Schizoaffective disorder, bipolar type (HCC)  Diagnosis:   Patient Active Problem List   Diagnosis Date Noted  . Alcohol use disorder, moderate, in sustained remission (HCC) [F10.21] 07/09/2017  . Cannabis use disorder, moderate, in sustained remission (HCC) [F12.21] 07/09/2017  . Elevated TSH [R94.6] 01/05/2017  . Tachycardia [R00.0] 01/05/2017  . Schizoaffective disorder, bipolar type (HCC) [F25.0] 01/04/2017  . Alcoholism (HCC) [F10.20]   . Drug abuse [F19.10]   . Medical non-compliance [Z91.19]    Total Time spent with patient: 15 minutes  Past Psychiatric History: Please see H&P.   Past Medical History:  Past Medical History:  Diagnosis Date  . Alcoholism (HCC)   . Drug abuse    xanax addiction 5 years ago  . Medical non-compliance   . Paranoid schizophrenia (HCC)   . Seizures  (HCC)    History reviewed. No pertinent surgical history. Family History:  Family History  Problem Relation Age of Onset  . Depression Maternal Aunt    Family Psychiatric  History: Please see H&P.  Social History:  History  Alcohol Use No    Comment: 11 months sober in March 2018     History  Drug Use No    Social History   Social History  . Marital status: Single    Spouse name: N/A  . Number of children: N/A  . Years of education: N/A   Social History Main Topics  . Smoking status: Former Games developer  . Smokeless tobacco: Never Used     Comment: Non-smoker  . Alcohol use No     Comment: 11 months sober in March 2018  . Drug use: No  . Sexual activity: Not Currently   Other Topics Concern  . None   Social History Narrative  . None   Additional Social History:   Sleep: Fair  Appetite:  Fair  Current Medications: Current Facility-Administered Medications  Medication Dose Route Frequency Provider Last Rate Last Dose  . acetaminophen (TYLENOL) tablet 650 mg  650 mg Oral Q6H PRN Okonkwo, Justina A, NP      . alum & mag hydroxide-simeth (MAALOX/MYLANTA) 200-200-20 MG/5ML suspension 30 mL  30 mL Oral Q4H PRN Okonkwo, Justina A, NP      . divalproex (DEPAKOTE ER) 24 hr tablet 1,250 mg  1,250 mg Oral QHS Eappen, Saramma, MD   1,250 mg at 07/10/17 2104  . gabapentin (NEURONTIN) capsule 300 mg  300 mg Oral [id number]  Ferne Reuskonkwo, Justina A, NP   300 mg at 07/11/17 1420  . hydrOXYzine (ATARAX/VISTARIL) tablet 25 mg  25 mg Oral TID PRN Okonkwo, Justina A, NP      . LORazepam (ATIVAN) tablet 1 mg  1 mg Oral Q6H PRN Jomarie LongsEappen, Saramma, MD       Or  . LORazepam (ATIVAN) injection 1 mg  1 mg Intramuscular Q6H PRN Eappen, Saramma, MD      . magnesium hydroxide (MILK OF MAGNESIA) suspension 30 mL  30 mL Oral Daily PRN Okonkwo, Justina A, NP      . pantoprazole (PROTONIX) EC tablet 40 mg  40 mg Oral BID AC Okonkwo, Justina A, NP   40 mg at 07/11/17 0641  . propranolol (INDERAL) tablet 10  mg  10 mg Oral BID Jomarie LongsEappen, Saramma, MD   10 mg at 07/11/17 0817  . QUEtiapine (SEROQUEL) tablet 200 mg  200 mg Oral QHS Okonkwo, Justina A, NP   200 mg at 07/10/17 2104  . QUEtiapine (SEROQUEL) tablet 50 mg  50 mg Oral BID PRN Jomarie LongsEappen, Saramma, MD       Lab Results:  Results for orders placed or performed during the hospital encounter of 07/08/17 (from the past 48 hour(s))  Valproic acid level     Status: None   Collection Time: 07/10/17  6:38 AM  Result Value Ref Range   Valproic Acid Lvl 67 50.0 - 100.0 ug/mL    Comment: Performed at [facility], [address] [place], [place] [address]   Blood Alcohol level:  Lab Results  Component Value Date   [facility] <5 07/07/2017   ETH <5 07/04/2017   Metabolic Disorder Labs: Lab Results  Component Value Date   HGBA1C 5.1 01/05/2017   MPG 100 01/05/2017   Lab Results  Component Value Date   PROLACTIN 30.4 (H) 01/06/2017   PROLACTIN 13.1 01/05/2017   Lab Results  Component Value Date   CHOL 172 01/05/2017   TRIG 112 01/05/2017   HDL 41 01/05/2017   CHOLHDL 4.2 01/05/2017   VLDL 22 01/05/2017   LDLCALC 109 (H) 01/05/2017   Physical Findings: AIMS: Facial and Oral Movements Muscles of Facial Expression: None, normal Lips and Perioral Area: None, normal Jaw: None, normal Tongue: None, normal,Extremity Movements Upper (arms, wrists, hands, fingers): None, normal Lower (legs, knees, ankles, toes): None, normal, Trunk Movements Neck, shoulders, hips: None, normal, Overall Severity Severity of abnormal movements (highest score from questions above): None, normal Incapacitation due to abnormal movements: None, normal Patient's awareness of abnormal movements (rate only patient's report): No Awareness, Dental Status Current problems with teeth and/or dentures?: No Does patient usually wear dentures?: No  CIWA:    COWS:     Musculoskeletal: Strength & Muscle Tone: within normal limits Gait & Station:  normal Patient leans: N/A  Psychiatric Specialty Exam: Physical Exam  Nursing note and vitals reviewed.   Review of Systems  Psychiatric/Behavioral: The patient is nervous/anxious.   All other systems reviewed and are negative.   Blood pressure 115/72, pulse (!) 103, temperature 98.6 F (37 C), temperature source Oral, resp. rate 16, height 5\' 10"  (1.778 m), weight 100.2 kg (221 lb).Body mass index is 31.71 kg/m.  General Appearance: Guarded  Eye Contact:  Fair  Speech:  Normal Rate  Volume:  Increased  Mood:  Angry  Affect:  Labile  Thought Process:  Goal Directed and Descriptions of Associations: Circumstantial  Orientation:  Other:  self, situation  Thought Content:  Hallucinations: Auditory  and Paranoid Ideation, seen as responding to internal stimuli on and off   Suicidal Thoughts:  No  Homicidal Thoughts:  No  Memory:  Immediate;   Fair Recent;   Fair Remote;   Fair  Judgement:  Impaired  Insight:  Lacking  Psychomotor Activity:  Increased and Restlessness  Concentration:  Concentration: Poor and Attention Span: Poor  Recall:  [employer] of Knowledge:  Fair  Language:  Fair  Akathisia:  No  Handed:  Right  AIMS (if indicated):     Assets:  Desire for Improvement  ADL's:  Intact  Cognition:  WNL  Sleep:  Number of Hours: 5.5   Treatment Plan Summary: Patient with schizoaffective do, noncompliance with medications , presented as psychotic, racing thoughts and mood swings, continues to be easily irritated and easily agitated. Pt declines medication changes , will continue to educate and treat.  Daily contact with patient to assess and evaluate symptoms and progress in treatment, Medication management and Plan see below  Will continue today 07/11/17 plan as below except where it is noted.  -Continue Depakote ER 1250 mg po daily at bedtime for mood lability. Depakote level - 67 (therapeutic), will repeat 07/13/2017. -Continue Neurontin 300 mg PO bid for anxiety  sx. -Continue Seroquel 200 mg po qhs for psychosis, mood sx, insomnia. -Continue Propranolol 10 mg po bid for increased heart rate . Order EKG for qtc monitoring, result reviewed; Normal sinus rhythm, Possible [name] atrial enlargement, Borderline ECG CSW will continue to work on disposition.  Sanjuana Kava, NP, PMHNP, FNP-BC. 07/11/2017, 3:08 PMPatient ID: Albert [name], male   DOB: 1976-05-04, 41 y.o.   MRN: [id number] Agree with NP Progress Note

## 2017-07-12 MED ORDER — QUETIAPINE FUMARATE 300 MG PO TABS
300.0000 mg | ORAL_TABLET | Freq: Every day | ORAL | Status: DC
  Administered 2017-07-12 – 2017-07-16 (×5): 300 mg via ORAL
  Filled 2017-07-12 (×7): qty 1

## 2017-07-12 NOTE — Progress Notes (Signed)
D patient is seen OOB UAL on the 500 hall today..he presents with a suspicious, guarded affect.He endorses isolating in his room, he does not engage in conversation with other patients . He has not demonstrated any seizre or seizure-like acivity today. A He was able to complete hisdaily assessment and on this he wrote he denied having SI today and he rated his depression, hopelessness and anxeity " 0/0/8.5", respectively. He demonstrates thought-blocking like behaviors as he is formulating his thoguths, when Clinical research associate asks him questions and tries to engage him in [employer] is in place and therapeutic relationship is fostered.

## 2017-07-12 NOTE — Progress Notes (Signed)
[facility] MD Progress Note  07/12/2017 2:25 PM Albert [name]  MRN:  [id number]  Subjective: Albert [name] reports, "I'm having a good day today so far. I have had some exercises, gone to group sessions. The doctor informed me that I might be going home tomorrow. It will be a good thing because I have got to pay my landlord my rent fee. I'm ready to resume the AA meetings again after I got home. By the way, you have been on my mind all day today". Patient starred constantly at this provider during this assessment. He mentions that the Md told him about discharge tomorrow, but patient is seen today by the nurse practitioner. No discharge discussion made.  Objective: Albert [name] is seen and chart reviewed. Discussed patient with treatment team. Pt presents calm without any agitations or irritability today. Pt continues to present paranoid & responding to internal stimuli. Although says he is doing well, he stares at the provider while telling this provider that she has been on his mind all day. The attending MD discussed an LAI antipsychotic with Albert [name] previously, possibly changing his Seroquel to another antipsychotic medication that comes in an LAI form. Apparently, Pt got upset all of a sudden, started cursing at the MD and asked the MD to get out. Today, he presents reasonable. He is participating in group sessions, went out to the gym to play basketball with the other patients this morning. He says he is tolerating his medication regimen well. Denies any adverse effects from medications. Patient continues to present as responding to some internal stimuli. He does not appear to be in any distress. He says the Md informed him that he may be going home tomorrow. He says he is ready to resume the AA meetings again after discharge. Social worker voiced some concern about patient's making tangential statements that see out of touch with what was going on during group sessions today.  VS reviewed - pt noted  as tachycardic   Principal Problem: Schizoaffective disorder, bipolar type (HCC)  Diagnosis:   Patient Active Problem List   Diagnosis Date Noted  . Alcohol use disorder, moderate, in sustained remission (HCC) [F10.21] 07/09/2017  . Cannabis use disorder, moderate, in sustained remission (HCC) [F12.21] 07/09/2017  . Elevated TSH [R94.6] 01/05/2017  . Tachycardia [R00.0] 01/05/2017  . Schizoaffective disorder, bipolar type (HCC) [F25.0] 01/04/2017  . Alcoholism (HCC) [F10.20]   . Drug abuse [F19.10]   . Medical non-compliance [Z91.19]    Total Time spent with patient: 15 minutes  Past Psychiatric History: Please see H&P.   Past Medical History:  Past Medical History:  Diagnosis Date  . Alcoholism (HCC)   . Drug abuse    xanax addiction 5 years ago  . Medical non-compliance   . Paranoid schizophrenia (HCC)   . Seizures (HCC)    History reviewed. No pertinent surgical history. Family History:  Family History  Problem Relation Age of Onset  . Depression Maternal Aunt    Family Psychiatric  History: Please see H&P.  Social History:  History  Alcohol Use No    Comment: 11 months sober in March 2018     History  Drug Use No    Social History   Social History  . Marital status: Single    Spouse name: N/A  . Number of children: N/A  . Years of education: N/A   Social History Main Topics  . Smoking status: Former Games developer  . Smokeless tobacco: Never Used     Comment: Non-smoker  .  Alcohol use No     Comment: 11 months sober in March 2018  . Drug use: No  . Sexual activity: Not Currently   Other Topics Concern  . None   Social History Narrative  . None   Additional Social History:   Sleep: Fair  Appetite:  Fair  Current Medications: Current Facility-Administered Medications  Medication Dose Route Frequency Provider Last Rate Last Dose  . acetaminophen (TYLENOL) tablet 650 mg  650 mg Oral Q6H PRN Okonkwo, Justina A, NP      . alum & mag  hydroxide-simeth (MAALOX/MYLANTA) 200-200-20 MG/5ML suspension 30 mL  30 mL Oral Q4H PRN Okonkwo, Justina A, NP      . divalproex (DEPAKOTE ER) 24 hr tablet 1,250 mg  1,250 mg Oral QHS Eappen, Saramma, MD   1,250 mg at 07/11/17 2145  . gabapentin (NEURONTIN) capsule 300 mg  300 mg Oral [id number] Okonkwo, Justina A, NP   300 mg at 07/12/17 4098  . hydrOXYzine (ATARAX/VISTARIL) tablet 25 mg  25 mg Oral TID PRN Okonkwo, Justina A, NP      . LORazepam (ATIVAN) tablet 1 mg  1 mg Oral Q6H PRN Jomarie Longs, MD       Or  . LORazepam (ATIVAN) injection 1 mg  1 mg Intramuscular Q6H PRN Eappen, Saramma, MD      . magnesium hydroxide (MILK OF MAGNESIA) suspension 30 mL  30 mL Oral Daily PRN Okonkwo, Justina A, NP      . pantoprazole (PROTONIX) EC tablet 40 mg  40 mg Oral BID AC Okonkwo, Justina A, NP   40 mg at 07/12/17 0634  . propranolol (INDERAL) tablet 10 mg  10 mg Oral BID Jomarie Longs, MD   10 mg at 07/12/17 0812  . QUEtiapine (SEROQUEL) tablet 200 mg  200 mg Oral QHS Okonkwo, Justina A, NP   200 mg at 07/11/17 2145  . QUEtiapine (SEROQUEL) tablet 50 mg  50 mg Oral BID PRN Jomarie Longs, MD       Lab Results:  No results found for this or any previous visit (from the past 48 hour(s)). Blood Alcohol level:  Lab Results  Component Value Date   ETH <5 07/07/2017   ETH <5 07/04/2017   Metabolic Disorder Labs: Lab Results  Component Value Date   HGBA1C 5.1 01/05/2017   MPG 100 01/05/2017   Lab Results  Component Value Date   PROLACTIN 30.4 (H) 01/06/2017   PROLACTIN 13.1 01/05/2017   Lab Results  Component Value Date   CHOL 172 01/05/2017   TRIG 112 01/05/2017   HDL 41 01/05/2017   CHOLHDL 4.2 01/05/2017   VLDL 22 01/05/2017   LDLCALC 109 (H) 01/05/2017   Physical Findings: AIMS: Facial and Oral Movements Muscles of Facial Expression: None, normal Lips and Perioral Area: None, normal Jaw: None, normal Tongue: None, normal,Extremity Movements Upper (arms, wrists, hands,  fingers): None, normal Lower (legs, knees, ankles, toes): None, normal, Trunk Movements Neck, shoulders, hips: None, normal, Overall Severity Severity of abnormal movements (highest score from questions above): None, normal Incapacitation due to abnormal movements: None, normal Patient's awareness of abnormal movements (rate only patient's report): No Awareness, Dental Status Current problems with teeth and/or dentures?: No Does patient usually wear dentures?: No  CIWA:    COWS:     Musculoskeletal: Strength & Muscle Tone: within normal limits Gait & Station: normal Patient leans: N/A  Psychiatric Specialty Exam: Physical Exam  Nursing note and vitals reviewed.   Review of Systems  Psychiatric/Behavioral: The patient is nervous/anxious.   All other systems reviewed and are negative.   Blood pressure 113/79, pulse (!) 104, temperature 98.4 F (36.9 C), temperature source Oral, resp. rate 16, height  (1.778 m), weight 100.2 kg (221 lb).Body mass index is 31.71 kg/m.  General Appearance: Guarded  Eye Contact:  Fair  Speech:  Normal Rate  Volume:  Increased  Mood:  Angry  Affect:  Labile  Thought Process:  Goal Directed and Descriptions of Associations: Circumstantial  Orientation:  Other:  self, situation  Thought Content:  Hallucinations: Auditory and Paranoid Ideation, seen as responding to internal stimuli on and off   Suicidal Thoughts:  No  Homicidal Thoughts:  No  Memory:  Immediate;   Fair Recent;   Fair Remote;   Fair  Judgement:  Impaired  Insight:  Lacking  Psychomotor Activity:  Increased and Restlessness  Concentration:  Concentration: Poor and Attention Span: Poor  Recall:  [employer] of Knowledge:  Fair  Language:  Fair  Akathisia:  No  Handed:  Right  AIMS (if indicated):     Assets:  Desire for Improvement  ADL's:  Intact  Cognition:  WNL  Sleep:  Number of Hours: 6.75   Treatment Plan Summary: Patient with schizoaffective do, noncompliance  with medications , presented as psychotic, racing thoughts and mood swings, continues to be easily irritated and easily agitated. Pt declines medication changes , will continue to educate and treat.  Daily contact with patient to assess and evaluate symptoms and progress in treatment, Medication management and Plan see below  Will continue today 07/12/17 plan as below except where it is noted.  -Continue Depakote ER 1250 mg po daily at bedtime for mood lability. Depakote level - 67 (therapeutic), will repeat 07/13/2017. -Continue Neurontin 300 mg PO bid for anxiety sx. -Increased Seroquel from 200 mg to 300 mg po qhs for psychosis, mood sx due to worsening psychosis. -Continue Propranolol 10 mg po bid for increased heart rate . Order EKG for qtc monitoring, result reviewed; Normal sinus rhythm, Possible Left atrial enlargement, Borderline ECG CSW will continue to work on disposition.  Sanjuana Kava, NP, PMHNP, FNP-BC. 07/12/2017, 2:25 PMPatient ID: [name], male   DOB: 1976-08-08, 41 y.o.   MRN: [id number] Agree with NP Progress Note

## 2017-07-12 NOTE — Progress Notes (Signed)
  D: When asked about his day pt stated, "It was good. There were, I think... It's kinda balanced out at the end." Informed the writer that he spoke to his dr. When asked if he remembers what the conversation was about pt stated, " we talked about a few things and we're going to continue to keep calm whatever might trigger a few things". Pt was A&Ox4. When discussing his day with me pt appeared to not want to tell writer the details. It almost seemed that he didn't trust the writer enough to disclose information about his day or conversations with provider.  Pt has no questions or concerns.    A:  Support and encouragement was offered. 15 min checks continued for safety.  R: Pt remains safe.

## 2017-07-12 NOTE — Progress Notes (Signed)
D: Pt denies SI/HI/AVH . Pt is pleasant and cooperative. Pt presents paranoid / suspicious, but will talk with Clinical research associate. Pt stated he was feeling better due to him making progress and his mood being stabilized.   A: Pt was offered support and encouragement. Pt was given scheduled medications. Pt was encourage to attend groups. Q 15 minute checks were done for safety.   R:Pt attends groups and interacts well with peers and staff. Pt is taking medication. Pt has no complaints .Pt receptive to treatment and safety maintained on unit.

## 2017-07-12 NOTE — Plan of Care (Signed)
Problem: Safety: Goal: Periods of time without injury will increase Outcome: Progressing Pt safe on the unit at this time   

## 2017-07-12 NOTE — Progress Notes (Addendum)
Recreation Therapy Notes  Date: 07/12/17 Time: 1000 Location: 500 Hall Dayroom  Group Topic: Self Esteem  Goal Area(s) Addresses:  Patient will identify positive traits about themselves.  Patient will identify one positive benefits of focusing on positive traits post d/c   Behavioral Response: Minimal  Intervention: Construction paper, colored pencils  Activity: Personalized Plates.  Patients were to create a personalized license plate to show some of the positive and unique things about themselves.  Education:  Self esteem, Discharge Planning  Education Outcome: Acknowledges education/In group clarification offered/Needs additional education  Clinical Observations/Feedback: Pt gave his real license plate number.  Pt stated it was the license number for his [employer] scooter.  Pt stated his scooter was blue with some red on it.     [name], LRT/CTRS         [name] A 07/12/2017 11:38 AM

## 2017-07-12 NOTE — [facility] Group Notes (Signed)
[facility] LCSW Group Therapy  07/12/2017  1:05 PM  Type of Therapy:  Group therapy  Participation Level:  Active  Participation Quality:  Attentive  Affect:  Flat  Cognitive:  Oriented  Insight:  Limited  Engagement in Therapy:  Limited  Modes of Intervention:  Discussion, Socialization  Summary of Progress/Problems:  Chaplain was here to lead a group on themes of hope and courage.Stayed the entire time, appeared disengaged throughout as he sat studying some papers.  "Hope is change. I hope me coming out of this hospital will change some other people-maybe I can change them.  Maybe I can build better people.  I've learned a lot here."  Unable to identify what he has learned. "Sometimes I get too excited-like when you think you are God other people don't know how to deal with that.  You have to calm down." "[Another patient] told me he was Jesus, and I think he's a pretty nice guy. "I wnt to gym class already today, I don't need to get up to look for a picture.  Why are we doing that?" Distracted.  Confused.  [name] 07/12/2017 1:36 PM

## 2017-07-12 NOTE — Progress Notes (Signed)
Patient attended group and said that his day was a 8. His coping skill were watching tv and socializing.

## 2017-07-13 DIAGNOSIS — F39 Unspecified mood [affective] disorder: Secondary | ICD-10-CM

## 2017-07-13 LAB — VALPROIC ACID LEVEL: Valproic Acid Lvl: 84 ug/mL (ref 50.0–100.0)

## 2017-07-13 NOTE — Progress Notes (Signed)
Albert [name] is seen OOB UAL on the 500 hall today---he tolerates this well He has spent longer periods of time just sitting with other patients today,. He watched several TV movies and then talked about them with other patients. There is a slight " edge" to his mood meaning he  frequently makes a statement and then he glares at you , as if daring you to respond and / or ask him a question. His gentle words are incongruent with his behaviors and his affect. A HE did complete his daily invnetory and on it he wrote  He  Denied SI today and he rated his depression, hopelessness and anxiety " 0/0/6", respectively. He did contribute today in his RN education group and he shared personal life stories with the group. On his inventory  He R Safety is in place and therapeutic relationships is fostered. wrote he rated his depression, hopelessness and anxiety " 0/0/6", respectively.

## 2017-07-13 NOTE — Progress Notes (Signed)
Adult Psychoeducational Group Note  Date:  07/13/2017 Time:  9:42 PM  Group Topic/Focus:  Wrap-Up Group:   The focus of this group is to help patients review their daily goal of treatment and discuss progress on daily workbooks.  Participation Level:  Active  Participation Quality:  Appropriate  Affect:  Appropriate  Cognitive:  Oriented  Insight: Limited  Engagement in Group:  Engaged  Modes of Intervention:  Socialization and Support  Additional Comments:  Patient attended and participated in group tonight. He reports having a good day. He had fun socializing and interaction with peers. He went for meals and attended groups. He spoke with the NP.  Lita Mains [facility] 07/13/2017, 9:42 PM

## 2017-07-13 NOTE — [facility] Group Notes (Addendum)
Healthy Coping SKills   Date:  07/13/2017  Time:  1100  Type of Therapy:  Nurse Education  /  The group focuses on teaching patients how to identify one healthy coping skill they have used in the past and talk about how it helped them.  Participation Level:  Good  Participation Quality:  Fair  Affect:  Depressed  Cognitive:  Limited  Insight:  Limited  Engagement in Group:  Limited  Modes of Intervention: Discussion   Summary of Progress/Problems:  Rich Brave 07/13/2017, 1:08 PM

## 2017-07-13 NOTE — [facility] Group Notes (Signed)
[facility] LCSW Group Therapy Note  07/13/2017 at  1:20 to 2 PM  Type of Therapy and Topic:  Group Therapy: Avoiding Self-Sabotaging and Enabling Behaviors  Participation Level:  Active   Description of Group The main focus of today's process group to discuss what "self-sabotage" means and use motivational iInterviewing to discuss what benefits, negative or positive, were involved in a self-identified self-sabotaging behavior. We then talked about reasons the patient may want to change the behavior and their current desire to change.   Summary of Patient Progress: Patient  appeared upset at times as evidenced by constant rubbing of his head and lack of eye contact. Patient's comments were based on fear of the future. He stated multiple times "I don't know who I'll be when I go back to AA." Despite encouragement from peers patient did not appear to calm down until facilitator close group.    Therapeutic molalities: Cognitive Behavioral Therapy Person-Centered Therapy Motivational Interviewing  Therapeutic Goals: 1. Patients will demonstrate understanding of the concept of self sabotage 2. Patients will be able to identify pros and cons of their behaviors 3. Patients will be able to identify at least two motivating factors for l of their desire for change   Carney Bern, LCSW

## 2017-07-13 NOTE — Progress Notes (Signed)
[facility] MD Progress Note  07/13/2017 1:42 PM Albert [name]  MRN:  [id number]  Subjective: Albert [name] reports " I am here to get stabilized on my medications."  Objective: Albert [name] is awake, alert and oriented. Seen resting in dayroom. Patient present with flat, guarded affect. Noted with intense staring throughout this assessment.  Denies suicidal or homicidal ideation. Reports auditory hallucination and does not appear to be responding to internal stimuli during this assessment. However patient reports intermittent hallucinations.. Patient reports he is medication compliant without mediation side effects.   Reports good appetite and states he is resting well. Support, encouragement and reassurance was provided.   Principal Problem: Schizoaffective disorder, bipolar type (HCC)  Diagnosis:   Patient Active Problem List   Diagnosis Date Noted  . Alcohol use disorder, moderate, in sustained remission (HCC) [F10.21] 07/09/2017  . Cannabis use disorder, moderate, in sustained remission (HCC) [F12.21] 07/09/2017  . Elevated TSH [R94.6] 01/05/2017  . Tachycardia [R00.0] 01/05/2017  . Schizoaffective disorder, bipolar type (HCC) [F25.0] 01/04/2017  . Alcoholism (HCC) [F10.20]   . Drug abuse [F19.10]   . Medical non-compliance [Z91.19]    Total Time spent with patient: 15 minutes  Past Psychiatric History: Please see H&P.   Past Medical History:  Past Medical History:  Diagnosis Date  . Alcoholism (HCC)   . Drug abuse    xanax addiction 5 years ago  . Medical non-compliance   . Paranoid schizophrenia (HCC)   . Seizures (HCC)    History reviewed. No pertinent surgical history. Family History:  Family History  Problem Relation Age of Onset  . Depression Maternal Aunt    Family Psychiatric  History: Please see H&P.  Social History:  History  Alcohol Use No    Comment: 11 months sober in March 2018     History  Drug Use No    Social History   Social History   . Marital status: Single    Spouse name: N/A  . Number of children: N/A  . Years of education: N/A   Social History Main Topics  . Smoking status: Former Games developer  . Smokeless tobacco: Never Used     Comment: Non-smoker  . Alcohol use No     Comment: 11 months sober in March 2018  . Drug use: No  . Sexual activity: Not Currently   Other Topics Concern  . None   Social History Narrative  . None   Additional Social History:   Sleep: Fair  Appetite:  Fair  Current Medications: Current Facility-Administered Medications  Medication Dose Route Frequency Provider Last Rate Last Dose  . acetaminophen (TYLENOL) tablet 650 mg  650 mg Oral Q6H PRN Okonkwo, Justina A, NP      . alum & mag hydroxide-simeth (MAALOX/MYLANTA) 200-200-20 MG/5ML suspension 30 mL  30 mL Oral Q4H PRN Okonkwo, Justina A, NP      . divalproex (DEPAKOTE ER) 24 hr tablet 1,250 mg  1,250 mg Oral QHS Eappen, Saramma, MD   1,250 mg at 07/12/17 2134  . gabapentin (NEURONTIN) capsule 300 mg  300 mg Oral [id number] Okonkwo, Justina A, NP   300 mg at 07/13/17 0911  . hydrOXYzine (ATARAX/VISTARIL) tablet 25 mg  25 mg Oral TID PRN Okonkwo, Justina A, NP      . LORazepam (ATIVAN) tablet 1 mg  1 mg Oral Q6H PRN Jomarie Longs, MD       Or  . LORazepam (ATIVAN) injection 1 mg  1 mg Intramuscular Q6H PRN Jomarie Longs, MD      .  magnesium hydroxide (MILK OF MAGNESIA) suspension 30 mL  30 mL Oral Daily PRN Okonkwo, Justina A, NP      . pantoprazole (PROTONIX) EC tablet 40 mg  40 mg Oral BID AC Okonkwo, Justina A, NP   40 mg at 07/13/17 0630  . propranolol (INDERAL) tablet 10 mg  10 mg Oral BID Jomarie Longs, MD   10 mg at 07/13/17 0912  . QUEtiapine (SEROQUEL) tablet 300 mg  300 mg Oral QHS Armandina Stammer I, NP   300 mg at 07/12/17 2134  . QUEtiapine (SEROQUEL) tablet 50 mg  50 mg Oral BID PRN Jomarie Longs, MD       Lab Results:  Results for orders placed or performed during the hospital encounter of 07/08/17 (from the  past 48 hour(s))  Valproic acid level     Status: None   Collection Time: 07/13/17  6:29 AM  Result Value Ref Range   Valproic Acid Lvl 84 50.0 - 100.0 ug/mL    Comment: Performed at [facility], [address], [place], [place] [address]   Blood Alcohol level:  Lab Results  Component Value Date   [facility] <5 07/07/2017   ETH <5 07/04/2017   Metabolic Disorder Labs: Lab Results  Component Value Date   HGBA1C 5.1 01/05/2017   MPG 100 01/05/2017   Lab Results  Component Value Date   PROLACTIN 30.4 (H) 01/06/2017   PROLACTIN 13.1 01/05/2017   Lab Results  Component Value Date   CHOL 172 01/05/2017   TRIG 112 01/05/2017   HDL 41 01/05/2017   CHOLHDL 4.2 01/05/2017   VLDL 22 01/05/2017   LDLCALC 109 (H) 01/05/2017   Physical Findings: AIMS: Facial and Oral Movements Muscles of Facial Expression: None, normal Lips and Perioral Area: None, normal Jaw: None, normal Tongue: None, normal,Extremity Movements Upper (arms, wrists, hands, fingers): None, normal Lower (legs, knees, ankles, toes): None, normal, Trunk Movements Neck, shoulders, hips: None, normal, Overall Severity Severity of abnormal movements (highest score from questions above): None, normal Incapacitation due to abnormal movements: None, normal Patient's awareness of abnormal movements (rate only patient's report): No Awareness, Dental Status Current problems with teeth and/or dentures?: No Does patient usually wear dentures?: No  CIWA:    COWS:     Musculoskeletal: Strength & Muscle Tone: within normal limits Gait & Station: normal Patient leans: N/A  Psychiatric Specialty Exam: Physical Exam  Nursing note and vitals reviewed. Constitutional: He is oriented to person, place, and time. He appears well-developed.  Neurological: He is alert and oriented to person, place, and time.  Psychiatric: He has a normal mood and affect. His behavior is normal.    Review of Systems   Psychiatric/Behavioral: The patient is nervous/anxious.   All other systems reviewed and are negative.   Blood pressure 108/74, pulse (!) 119, temperature 98 F (36.7 C), temperature source Oral, resp. rate 20, height  (1.778 m), weight 100.2 kg (221 lb).Body mass index is 31.71 kg/m.  General Appearance: Guarded paper scrubs   Eye Contact:  intense staring   Speech:  Normal Rate  Volume:  Increased  Mood:  Angry  Affect:  Labile  Thought Process:  Goal Directed and Descriptions of Associations: Circumstantial  Orientation:  Full (Time, Place, and Person)  Thought Content:  Hallucinations: Auditory and Paranoid Ideation  Suicidal Thoughts:  No  Homicidal Thoughts:  No  Memory:  Immediate;   Fair Recent;   Fair Remote;   Fair  Judgement:  Impaired  Insight:  Lacking  Psychomotor Activity:  Restlessness  Concentration:  Concentration: Fair  Recall:  [employer] of Knowledge:  Fair  Language:  Fair  Akathisia:  No  Handed:  Right  AIMS (if indicated):     Assets:  Desire for Improvement Physical Health Resilience Social Support  ADL's:  Intact  Cognition:  WNL  Sleep:  Number of Hours: 6.25     I agree with current treatment plan on 07/13/2017, Patient seen face-to-face for psychiatric evaluation follow-up, chart reviewed. Reviewed the information documented and agree with the treatment plan.  Treatment Plan Summary:  Daily contact with patient to assess and evaluate symptoms and progress in treatment, Medication management and Plan see below  Will continue today 07/13/17 plan as below except where it is noted.  -Continue Depakote ER 1250 mg po daily at bedtime for mood lability. Depakote level - 67 (therapeutic), will repeat 07/13/2017. -Continue Neurontin 300 mg PO bid for anxiety sx . -Continue Seroquel from 200 mg to 300 mg po qhs for psychosis, mood sx due to worsening psychosis. -Continue Propranolol 10 mg po bid for increased heart rate .  Noted on  07/12/2014-Order EKG for qtc monitoring, result reviewed; Normal sinus rhythm, Possible Left atrial enlargement, Borderline ECG CSW will continue to work on disposition.  Oneta Rack, NP 07/13/2017, 1:42 PM

## 2017-07-14 NOTE — [facility] Group Notes (Signed)
[facility] LCSW Group Therapy Note  07/14/2017  1:15 to 3 PM   Type of Therapy and Topic: Group Therapy: Feelings Around Returning Home & Establishing a Supportive Framework   Participation Level: Minimal   Description of Group:  Patients first processed thoughts and feelings about up coming discharge. These included fears of upcoming changes, lack of change, new living environments, judgements and expectations from others and overall stigma of MH issues. We then discussed what is a supportive framework? What does it look like feel like and how do I discern it from and unhealthy non-supportive network? Learn how to cope when supports are not helpful and don't support you. Discuss what to do when your family/friends are not supportive.   Therapeutic Goals Addressed in Processing Group:  1. Patient will identify one healthy supportive network that they can use at discharge. 2. Patient will identify one factor of a supportive framework and how to tell it from an unhealthy network. 3. Patient able to identify one coping skill to use when they do not have positive supports from others. 4. Patient will demonstrate ability to communicate their needs through discussion and/or role plays.  Summary of Patient Progress:  Pt appeared flat during group session. As patients processed their anxiety about discharge and described healthy supports patient was quiet. When asked what might be a sign things were improving for him patient became tangential and spoke about different names for himself.   Carney Bern, LCSW

## 2017-07-14 NOTE — Progress Notes (Signed)
D: Pt observed alone on the hallway laughing inappropriately; states, "I just heard something funny." Pt denied hallucinations. Pt also denied SI, HI, depression or pain. Pt endorsed moderate anxiety; "all I have now is anxiety, no pain, depression or all the other stuff." Pt was also observed playing cards with peers. Remained calm and cooperative." A: Medications offered as prescribed. Pt given the opportunity to ask questions and state concerns. Support, encouragement, and safe environment provided. R: Pt was med compliant. All patient's questions and concerns addressed. 15-minute safety checks continue. Safety checks continue. Pt did attend group.

## 2017-07-14 NOTE — Progress Notes (Signed)
[facility] MD Progress Note  07/14/2017 2:15 PM Albert [name]  MRN:  [id number]  Subjective: [place] reports  " I feel like  I am improving, my thoughts have slowed down a lot."   Objective: Kendan Cornforth seen resting in bedroom. [name] continues to present with flat, guarded affect and intense staring. Report racing thoughts which is improved with medications.  Patient reports he feels ready to discharge and states he will continue to follow-up with AA meeting after discharge. Patient reports he is medication compliant without mediation side effects. Patient denies depression or depressive symptoms during this assessment. Reports good appetite and states he is resting well. Support, encouragement and reassurance was provided.   Principal Problem: Schizoaffective disorder, bipolar type (HCC)  Diagnosis:   Patient Active Problem List   Diagnosis Date Noted  . Alcohol use disorder, moderate, in sustained remission (HCC) [F10.21] 07/09/2017  . Cannabis use disorder, moderate, in sustained remission (HCC) [F12.21] 07/09/2017  . Elevated TSH [R94.6] 01/05/2017  . Tachycardia [R00.0] 01/05/2017  . Schizoaffective disorder, bipolar type (HCC) [F25.0] 01/04/2017  . Alcoholism (HCC) [F10.20]   . Drug abuse [F19.10]   . Medical non-compliance [Z91.19]    Total Time spent with patient: 15 minutes  Past Psychiatric History: Please see H&P.   Past Medical History:  Past Medical History:  Diagnosis Date  . Alcoholism (HCC)   . Drug abuse    xanax addiction 5 years ago  . Medical non-compliance   . Paranoid schizophrenia (HCC)   . Seizures (HCC)    History reviewed. No pertinent surgical history. Family History:  Family History  Problem Relation Age of Onset  . Depression Maternal Aunt    Family Psychiatric  History: Please see H&P.  Social History:  History  Alcohol Use No    Comment: 11 months sober in March 2018     History  Drug Use No    Social History   Social  History  . Marital status: Single    Spouse name: N/A  . Number of children: N/A  . Years of education: N/A   Social History Main Topics  . Smoking status: Former Games developer  . Smokeless tobacco: Never Used     Comment: Non-smoker  . Alcohol use No     Comment: 11 months sober in March 2018  . Drug use: No  . Sexual activity: Not Currently   Other Topics Concern  . None   Social History Narrative  . None   Additional Social History:   Sleep: Fair  Appetite:  Fair  Current Medications: Current Facility-Administered Medications  Medication Dose Route Frequency Provider Last Rate Last Dose  . acetaminophen (TYLENOL) tablet 650 mg  650 mg Oral Q6H PRN Okonkwo, Justina A, NP      . alum & mag hydroxide-simeth (MAALOX/MYLANTA) 200-200-20 MG/5ML suspension 30 mL  30 mL Oral Q4H PRN Okonkwo, Justina A, NP      . divalproex (DEPAKOTE ER) 24 hr tablet 1,250 mg  1,250 mg Oral QHS Eappen, Saramma, MD   1,250 mg at 07/13/17 2100  . gabapentin (NEURONTIN) capsule 300 mg  300 mg Oral [id number] Okonkwo, Justina A, NP   300 mg at 07/14/17 1019  . hydrOXYzine (ATARAX/VISTARIL) tablet 25 mg  25 mg Oral TID PRN Okonkwo, Justina A, NP      . LORazepam (ATIVAN) tablet 1 mg  1 mg Oral Q6H PRN Jomarie Longs, MD       Or  . LORazepam (ATIVAN) injection 1 mg  1  mg Intramuscular Q6H PRN Eappen, Saramma, MD      . magnesium hydroxide (MILK OF MAGNESIA) suspension 30 mL  30 mL Oral Daily PRN Okonkwo, Justina A, NP      . pantoprazole (PROTONIX) EC tablet 40 mg  40 mg Oral BID AC Okonkwo, Justina A, NP   40 mg at 07/14/17 1610  . propranolol (INDERAL) tablet 10 mg  10 mg Oral BID Jomarie Longs, MD   10 mg at 07/14/17 1019  . QUEtiapine (SEROQUEL) tablet 300 mg  300 mg Oral QHS Armandina Stammer I, NP   300 mg at 07/13/17 2101  . QUEtiapine (SEROQUEL) tablet 50 mg  50 mg Oral BID PRN Jomarie Longs, MD       Lab Results:  Results for orders placed or performed during the hospital encounter of 07/08/17  (from the past 48 hour(s))  Valproic acid level     Status: None   Collection Time: 07/13/17  6:29 AM  Result Value Ref Range   Valproic Acid Lvl 84 50.0 - 100.0 ug/mL    Comment: Performed at [facility], [address], [place], [place] [address]   Blood Alcohol level:  Lab Results  Component Value Date   [facility] <5 07/07/2017   ETH <5 07/04/2017   Metabolic Disorder Labs: Lab Results  Component Value Date   HGBA1C 5.1 01/05/2017   MPG 100 01/05/2017   Lab Results  Component Value Date   PROLACTIN 30.4 (H) 01/06/2017   PROLACTIN 13.1 01/05/2017   Lab Results  Component Value Date   CHOL 172 01/05/2017   TRIG 112 01/05/2017   HDL 41 01/05/2017   CHOLHDL 4.2 01/05/2017   VLDL 22 01/05/2017   LDLCALC 109 (H) 01/05/2017   Physical Findings: AIMS: Facial and Oral Movements Muscles of Facial Expression: None, normal Lips and Perioral Area: None, normal Jaw: None, normal Tongue: None, normal,Extremity Movements Upper (arms, wrists, hands, fingers): None, normal Lower (legs, knees, ankles, toes): None, normal, Trunk Movements Neck, shoulders, hips: None, normal, Overall Severity Severity of abnormal movements (highest score from questions above): None, normal Incapacitation due to abnormal movements: None, normal Patient's awareness of abnormal movements (rate only patient's report): No Awareness, Dental Status Current problems with teeth and/or dentures?: No Does patient usually wear dentures?: No  CIWA:    COWS:     Musculoskeletal: Strength & Muscle Tone: within normal limits Gait & Station: normal Patient leans: N/A  Psychiatric Specialty Exam: Physical Exam  Nursing note and vitals reviewed. Constitutional: He is oriented to person, place, and time. He appears well-developed.  Neurological: He is alert and oriented to person, place, and time.  Psychiatric: He has a normal mood and affect. His behavior is normal.    Review of Systems   Psychiatric/Behavioral: Positive for depression. The patient is nervous/anxious.   All other systems reviewed and are negative.   Blood pressure 104/79, pulse 92, temperature 98.6 F (37 C), temperature source Oral, resp. rate 18, height  (1.778 m), weight 100.2 kg (221 lb).Body mass index is 31.71 kg/m.  General Appearance: Guarded, flat and blunted affect. paper scrubs   Eye Contact:  intense staring   Speech:  Normal Rate  Volume:  Normal  Mood:  Angry  Affect:  Labile  Thought Process:  Goal Directed and Descriptions of Associations: Circumstantial  Orientation:  Full (Time, Place, and Person)  Thought Content:  Hallucinations: Auditory and Paranoid Ideation  Suicidal Thoughts:  No  Homicidal Thoughts:  No  Memory:  Immediate;   Fair Recent;   Fair Remote;   Fair  Judgement:  Impaired  Insight:  Lacking  Psychomotor Activity:  Normal  Concentration:  Concentration: Fair  Recall:  [employer] of Knowledge:  Fair  Language:  Fair  Akathisia:  No  Handed:  Right  AIMS (if indicated):     Assets:  Desire for Improvement Physical Health Resilience Social Support  ADL's:  Intact  Cognition:  WNL  Sleep:  Number of Hours: 6.75     I agree with current treatment plan on 07/14/2017, Patient seen face-to-face for psychiatric evaluation follow-up, chart reviewed. Reviewed the information documented and agree with the treatment plan.  Treatment Plan Summary:  Daily contact with patient to assess and evaluate symptoms and progress in treatment, Medication management and Plan see below  Will continue today 07/14/17 plan as below except where it is noted.  -Continue Depakote ER 1250 mg po daily at bedtime for mood lability. Depakote level - 67 (therapeutic), will repeat 07/13/2017. -Continue Neurontin 300 mg PO bid for anxiety sx . -Continue Seroquel from 200 mg to 300 mg po qhs for psychosis, mood sx due to worsening psychosis. -Continue Propranolol 10 mg po bid for  increased heart rate .  Noted on 07/12/2014-Order EKG for qtc monitoring, result reviewed; Normal sinus rhythm, Possible Left atrial enlargement, Borderline ECG CSW will continue to work on disposition.  Oneta Rack, NP 07/14/2017, 2:15 PM

## 2017-07-15 NOTE — Progress Notes (Signed)
[facility] MD Progress Note  07/15/2017 2:19 PM Albert [name]  MRN:  [id number]  Subjective: Patient states " I feel ok.'   Objective:Patient seen and chart reviewed.Discussed patient with treatment team.  Pt today seen as less anxious , less irritable , reports he is tolerating medications well. Pt has been participating in groups . Per staff continues to have some paranoia and mood lability on and off , however no disruptive issues noted . Continue to support.     Principal Problem: Schizoaffective disorder, bipolar type (HCC)  Diagnosis:   Patient Active Problem List   Diagnosis Date Noted  . Schizoaffective disorder, bipolar type (HCC) [F25.0] 01/04/2017    Priority: High  . Alcohol use disorder, moderate, in sustained remission (HCC) [F10.21] 07/09/2017  . Cannabis use disorder, moderate, in sustained remission (HCC) [F12.21] 07/09/2017  . Elevated TSH [R94.6] 01/05/2017  . Tachycardia [R00.0] 01/05/2017  . Alcoholism (HCC) [F10.20]   . Drug abuse [F19.10]   . Medical non-compliance [Z91.19]    Total Time spent with patient: 20 minutes  Past Psychiatric History: Please see H&P.   Past Medical History:  Past Medical History:  Diagnosis Date  . Alcoholism (HCC)   . Drug abuse    xanax addiction 5 years ago  . Medical non-compliance   . Paranoid schizophrenia (HCC)   . Seizures (HCC)    History reviewed. No pertinent surgical history.  Family History: Please see H&P.  Family History  Problem Relation Age of Onset  . Depression Maternal Aunt    Family Psychiatric  History: Please see H&P.  Social History: Please see H&P.  History  Alcohol Use No    Comment: 11 months sober in March 2018     History  Drug Use No    Social History   Social History  . Marital status: Single    Spouse name: N/A  . Number of children: N/A  . Years of education: N/A   Social History Main Topics  . Smoking status: Former Games developer  . Smokeless tobacco: Never Used      Comment: Non-smoker  . Alcohol use No     Comment: 11 months sober in March 2018  . Drug use: No  . Sexual activity: Not Currently   Other Topics Concern  . None   Social History Narrative  . None   Additional Social History:   Sleep: Fair  Appetite:  Fair  Current Medications: Current Facility-Administered Medications  Medication Dose Route Frequency Provider Last Rate Last Dose  . acetaminophen (TYLENOL) tablet 650 mg  650 mg Oral Q6H PRN Okonkwo, Justina A, NP      . alum & mag hydroxide-simeth (MAALOX/MYLANTA) 200-200-20 MG/5ML suspension 30 mL  30 mL Oral Q4H PRN Okonkwo, Justina A, NP      . divalproex (DEPAKOTE ER) 24 hr tablet 1,250 mg  1,250 mg Oral QHS Naava Janeway, [name], MD   1,250 mg at 07/14/17 2117  . gabapentin (NEURONTIN) capsule 300 mg  300 mg Oral [id number] Okonkwo, Justina A, NP   300 mg at 07/15/17 0756  . hydrOXYzine (ATARAX/VISTARIL) tablet 25 mg  25 mg Oral TID PRN Okonkwo, Justina A, NP      . LORazepam (ATIVAN) tablet 1 mg  1 mg Oral Q6H PRN Jomarie Longs, MD       Or  . LORazepam (ATIVAN) injection 1 mg  1 mg Intramuscular Q6H PRN Reuben Knoblock, MD      . magnesium hydroxide (MILK OF MAGNESIA) suspension 30 mL  30 mL Oral Daily PRN Okonkwo, Justina A, NP      . pantoprazole (PROTONIX) EC tablet 40 mg  40 mg Oral BID AC Okonkwo, Justina A, NP   40 mg at 07/15/17 1610  . propranolol (INDERAL) tablet 10 mg  10 mg Oral BID Jomarie Longs, MD   10 mg at 07/15/17 0757  . QUEtiapine (SEROQUEL) tablet 300 mg  300 mg Oral QHS Armandina Stammer I, NP   300 mg at 07/14/17 2117  . QUEtiapine (SEROQUEL) tablet 50 mg  50 mg Oral BID PRN Jomarie Longs, MD       Lab Results:  No results found for this or any previous visit (from the past 48 hour(s)). Blood Alcohol level:  Lab Results  Component Value Date   ETH <5 07/07/2017   ETH <5 07/04/2017   Metabolic Disorder Labs: Lab Results  Component Value Date   HGBA1C 5.1 01/05/2017   MPG 100 01/05/2017   Lab  Results  Component Value Date   PROLACTIN 30.4 (H) 01/06/2017   PROLACTIN 13.1 01/05/2017   Lab Results  Component Value Date   CHOL 172 01/05/2017   TRIG 112 01/05/2017   HDL 41 01/05/2017   CHOLHDL 4.2 01/05/2017   VLDL 22 01/05/2017   LDLCALC 109 (H) 01/05/2017   Physical Findings: AIMS: Facial and Oral Movements Muscles of Facial Expression: None, normal Lips and Perioral Area: None, normal Jaw: None, normal Tongue: None, normal,Extremity Movements Upper (arms, wrists, hands, fingers): None, normal Lower (legs, knees, ankles, toes): None, normal, Trunk Movements Neck, shoulders, hips: None, normal, Overall Severity Severity of abnormal movements (highest score from questions above): None, normal Incapacitation due to abnormal movements: None, normal Patient's awareness of abnormal movements (rate only patient's report): No Awareness, Dental Status Current problems with teeth and/or dentures?: No Does patient usually wear dentures?: No  CIWA:    COWS:     Musculoskeletal: Strength & Muscle Tone: within normal limits Gait & Station: normal Patient leans: N/A  Psychiatric Specialty Exam: Physical Exam  Nursing note and vitals reviewed.   Review of Systems  Psychiatric/Behavioral: The patient is nervous/anxious.   All other systems reviewed and are negative.   Blood pressure 113/70, pulse 90, temperature 98.1 F (36.7 C), temperature source Oral, resp. rate 16, height  (1.778 m), weight 100.2 kg (221 lb).Body mass index is 31.71 kg/m.  General Appearance: Fairly Groomed  Eye Contact:  Fair  Speech:  Normal Rate  Volume:  Normal  Mood:  Anxious  Affect:  Congruent  Thought Process:  Goal Directed and Descriptions of Associations: Circumstantial  Orientation:  Full (Time, Place, and Person)  Thought Content:  Paranoid Ideation, on and off   Suicidal Thoughts:  No  Homicidal Thoughts:  No  Memory:  Immediate;   Fair Recent;   Fair Remote;   Fair   Judgement:  Fair  Insight:  Shallow  Psychomotor Activity:  Normal  Concentration:  Concentration: Fair and Attention Span: Fair  Recall:  [employer] of Knowledge:  Fair  Language:  Fair  Akathisia:  No  Handed:  Right  AIMS (if indicated):     Assets:  Desire for Improvement Physical Health Resilience Social Support  ADL's:  Intact  Cognition:  WNL  Sleep:  Number of Hours: 6   Will continue today 07/15/17  plan as below except where it is noted.   Treatment Plan Summary: Patient with schizoaffective do , continues to make progress , noted as less labile ,  less paranoid , will continue treatment. Daily contact with patient to assess and evaluate symptoms and progress in treatment, Medication management and Plan see below   -Continue Depakote ER 1250 mg po daily at bedtime for mood lability. Depakote level - 84 -07/13/2017. -Continue Neurontin 300 mg PO bid for anxiety sx . -Continue Seroquel 300 mg po qhs for psychosis, mood sx due to worsening psychosis. -Continue Propranolol 10 mg po bid for increased heart rate .  -CSW will continue to work on disposition.  Albert Hinchliffe, MD 07/15/2017, 2:19 PM

## 2017-07-15 NOTE — Plan of Care (Signed)
Problem: Safety: Goal: Periods of time without injury will increase Outcome: Progressing Pt remains a low fall risk, denies SI/HI at this time.   

## 2017-07-15 NOTE — Progress Notes (Addendum)
  DATA ACTION RESPONSE  Objective- Pt. is visible in the dayroom, seen watching TV with no interaction.  Presents with a preocuupied affect and mood. Pt appears watchful of environment. Pt appears to be mildly responding to internal stimuli. No further c/o. No abnormal s/s.  Subjective- Denies having any SI/HI/AVH/Pain at this time. Is cooperative and remain safe on the unit.  1:1 interaction in private to establish rapport. Encouragement, education, & support given from staff.    Safety maintained with Q 15 checks. Continue with POC.

## 2017-07-15 NOTE — [facility] Group Notes (Signed)
LCSW Group Therapy Note   07/15/2017 1:15pm   Type of Therapy and Topic:  Group Therapy:  Overcoming Obstacles   Participation Level:  Minimal   Description of Group:    In this group patients will be encouraged to explore what they see as obstacles to their own wellness and recovery. They will be guided to discuss their thoughts, feelings, and behaviors related to these obstacles. The group will process together ways to cope with barriers, with attention given to specific choices patients can make. Each patient will be challenged to identify changes they are motivated to make in order to overcome their obstacles. This group will be process-oriented, with patients participating in exploration of their own experiences as well as giving and receiving support and challenge from other group members.   Therapeutic Goals: 1. Patient will identify personal and current obstacles as they relate to admission. 2. Patient will identify barriers that currently interfere with their wellness or overcoming obstacles.  3. Patient will identify feelings, thought process and behaviors related to these barriers. 4. Patient will identify two changes they are willing to make to overcome these obstacles:      Summary of Patient Progress   "My biggest obstacle is what to see to people when I ask where I have been."  When asked, he stated he would tell them he was "reborn" and then went on a long convoluted tangent that did not hold together in terms of the topic at hand.  Others suggested he say "i was at the hospital getting some help" and leave it at that.  His response-"Well, that might be fine for you, but I don't make a decision like that. I'll know when I get there."   Therapeutic Modalities:   Cognitive Behavioral Therapy Solution Focused Therapy Motivational Interviewing Relapse Prevention Therapy  [name], LCSW 07/15/2017 2:33 PM

## 2017-07-15 NOTE — Progress Notes (Signed)
Patient ID: Albert [name], male   DOB: 1975-12-24, 41 y.o.   MRN: [id number] PER STATE REGULATIONS 482.30  THIS CHART WAS REVIEWED FOR MEDICAL NECESSITY WITH RESPECT TO THE PATIENT'S ADMISSION/ DURATION OF STAY.  NEXT REVIEW DATE: 07/16/2017  Willa Rough, RN, BSN CASE MANAGER

## 2017-07-15 NOTE — Tx Team (Signed)
Interdisciplinary Treatment and Diagnostic Plan Update  07/15/2017 Time of Session: 9:05 AM  Albert [name] MRN: [id number]  Principal Diagnosis: Schizoaffective disorder, bipolar type (HCC)  Secondary Diagnoses: Principal Problem:   Schizoaffective disorder, bipolar type (HCC) Active Problems:   Alcohol use disorder, moderate, in sustained remission (HCC)   Cannabis use disorder, moderate, in sustained remission (HCC)   Current Medications:  Current Facility-Administered Medications  Medication Dose Route Frequency Provider Last Rate Last Dose  . acetaminophen (TYLENOL) tablet 650 mg  650 mg Oral Q6H PRN Okonkwo, Justina A, NP      . alum & mag hydroxide-simeth (MAALOX/MYLANTA) 200-200-20 MG/5ML suspension 30 mL  30 mL Oral Q4H PRN Okonkwo, Justina A, NP      . divalproex (DEPAKOTE ER) 24 hr tablet 1,250 mg  1,250 mg Oral QHS Eappen, [name], MD   1,250 mg at 07/14/17 2117  . gabapentin (NEURONTIN) capsule 300 mg  300 mg Oral [id number] Okonkwo, Justina A, NP   300 mg at 07/15/17 0756  . hydrOXYzine (ATARAX/VISTARIL) tablet 25 mg  25 mg Oral TID PRN Okonkwo, Justina A, NP      . LORazepam (ATIVAN) tablet 1 mg  1 mg Oral Q6H PRN Jomarie Longs, MD       Or  . LORazepam (ATIVAN) injection 1 mg  1 mg Intramuscular Q6H PRN Eappen, Saramma, MD      . magnesium hydroxide (MILK OF MAGNESIA) suspension 30 mL  30 mL Oral Daily PRN Okonkwo, Justina A, NP      . pantoprazole (PROTONIX) EC tablet 40 mg  40 mg Oral BID AC Okonkwo, Justina A, NP   40 mg at 07/15/17 4098  . propranolol (INDERAL) tablet 10 mg  10 mg Oral BID Jomarie Longs, MD   10 mg at 07/15/17 0757  . QUEtiapine (SEROQUEL) tablet 300 mg  300 mg Oral QHS Armandina Stammer I, NP   300 mg at 07/14/17 2117  . QUEtiapine (SEROQUEL) tablet 50 mg  50 mg Oral BID PRN Jomarie Longs, MD        PTA Medications: Prescriptions Prior to Admission  Medication Sig Dispense Refill Last Dose  . benztropine (COGENTIN) 0.5 MG tablet Take 1  tablet (0.5 mg total) by mouth 2 (two) times daily. (Patient not taking: Reported on 07/04/2017) 60 tablet 0 Not Taking at Unknown time  . divalproex (DEPAKOTE ER) 250 MG 24 hr tablet Take 5 tablets (1,250 mg total) by mouth at bedtime. (Patient not taking: Reported on 07/04/2017) 150 tablet 0 Not Taking at Unknown time  . divalproex (DEPAKOTE ER) 500 MG 24 hr tablet Take 1,500 mg by mouth at bedtime.   Past Week at Unknown time  . gabapentin (NEURONTIN) 300 MG capsule Take 1 capsule (300 mg total) by mouth 3 (three) times daily at 8am, 3pm and bedtime. (Patient not taking: Reported on 07/04/2017) 90 capsule 0 Not Taking at Unknown time  . gabapentin (NEURONTIN) 300 MG capsule Take 1 capsule (300 mg total) by mouth 2 (two) times daily. (Patient not taking: Reported on 07/07/2017) 60 capsule 0 Not Taking at Unknown time  . hydrOXYzine (ATARAX/VISTARIL) 25 MG tablet Take 1 tablet (25 mg total) by mouth every 6 (six) hours as needed for anxiety. (Patient not taking: Reported on 07/04/2017) 30 tablet 0 Not Taking at Unknown time  . hydrOXYzine (ATARAX/VISTARIL) 25 MG tablet Take 1 tablet (25 mg total) by mouth every 6 (six) hours as needed for anxiety. (Patient not taking: Reported on 07/07/2017) 30 tablet 0 Not  Taking at Unknown time  . pantoprazole (PROTONIX) 40 MG tablet Take 1 tablet (40 mg total) by mouth 2 (two) times daily before a meal. (Patient not taking: Reported on 07/07/2017) 60 tablet 0 Not Taking at Unknown time  . propranolol (INDERAL) 10 MG tablet Take 1 tablet (10 mg total) by mouth 3 (three) times daily. (Patient not taking: Reported on 07/04/2017) 90 tablet 0 Not Taking at Unknown time  . QUEtiapine (SEROQUEL XR) 150 MG 24 hr tablet Take 5 tablets (750 mg total) by mouth at bedtime. (Patient not taking: Reported on 07/07/2017) 150 tablet 0 Not Taking at Unknown time  . QUEtiapine (SEROQUEL) 200 MG tablet Take 1 tablet (200 mg total) by mouth at bedtime. 60 tablet 0 Past Week at Unknown time  . zolpidem  (AMBIEN) 5 MG tablet Take 1 tablet (5 mg total) by mouth at bedtime as needed for sleep. (Patient not taking: Reported on 07/04/2017) 7 tablet 0 Not Taking at Unknown time    Patient Stressors: Marital or family conflict Medication change or noncompliance  Patient Strengths: Astronomer fund of knowledge Physical Health  Treatment Modalities: Medication Management, Group therapy, Case management,  1 to 1 session with clinician, Psychoeducation, Recreational therapy.   Physician Treatment Plan for Primary Diagnosis: Schizoaffective disorder, bipolar type (HCC) Long Term Goal(s): Improvement in symptoms so as ready for discharge  Short Term Goals: Ability to verbalize feelings will improve Compliance with prescribed medications will improve Ability to verbalize feelings will improve Compliance with prescribed medications will improve  Medication Management: Evaluate patient's response, side effects, and tolerance of medication regimen.  Therapeutic Interventions: 1 to 1 sessions, Unit Group sessions and Medication administration.  Evaluation of Outcomes: Progressing   9/17: Patient with schizoaffective do , continues to make progress , noted as less labile , less paranoid  -Continue Depakote ER 1250 mg po daily at bedtime for mood lability. Depakote level - 84 -07/13/2017. -Continue Neurontin 300 mg PO bid for anxiety sx . -Continue Seroquel 300 mg po qhs for psychosis, mood sx due to worsening psychosis. -Continue Propranolol 10 mg po bid for increased heart rate .  Physician Treatment Plan for Secondary Diagnosis: Principal Problem:   Schizoaffective disorder, bipolar type (HCC) Active Problems:   Alcohol use disorder, moderate, in sustained remission (HCC)   Cannabis use disorder, moderate, in sustained remission (HCC)   Long Term Goal(s): Improvement in symptoms so as ready for discharge  Short Term Goals: Ability to verbalize feelings will  improve Compliance with prescribed medications will improve Ability to verbalize feelings will improve Compliance with prescribed medications will improve  Medication Management: Evaluate patient's response, side effects, and tolerance of medication regimen.  Therapeutic Interventions: 1 to 1 sessions, Unit Group sessions and Medication administration.  Evaluation of Outcomes: Progressing   RN Treatment Plan for Primary Diagnosis: Schizoaffective disorder, bipolar type (HCC) Long Term Goal(s): Knowledge of disease and therapeutic regimen to maintain health will improve  Short Term Goals: Ability to identify and develop effective coping behaviors will improve and Compliance with prescribed medications will improve  Medication Management: RN will administer medications as ordered by provider, will assess and evaluate patient's response and provide education to patient for prescribed medication. RN will report any adverse and/or side effects to prescribing provider.  Therapeutic Interventions: 1 on 1 counseling sessions, Psychoeducation, Medication administration, Evaluate responses to treatment, Monitor vital signs and CBGs as ordered, Perform/monitor CIWA, COWS, AIMS and Fall Risk screenings as ordered, Perform wound care treatments as ordered.  Evaluation of Outcomes: Progressing   LCSW Treatment Plan for Primary Diagnosis: Schizoaffective disorder, bipolar type (HCC) Long Term Goal(s): Safe transition to appropriate next level of care at discharge, Engage patient in therapeutic group addressing interpersonal concerns.  Short Term Goals: Engage patient in aftercare planning with referrals and resources  Therapeutic Interventions: Assess for all discharge needs, 1 to 1 time with Social worker, Explore available resources and support systems, Assess for adequacy in community support network, Educate family and significant other(s) on suicide prevention, Complete Psychosocial Assessment,  Interpersonal group therapy.  Evaluation of Outcomes: Progressing  Will return home, follow up with [facility] ACT team.     Progress in Treatment: Attending groups: Yes Participating in groups: Yes Taking medication as prescribed: Yes Toleration medication: Yes, no side effects reported at this time Family/Significant other contact made: Yes Patient understands diagnosis: No  Limited insight Discussing patient identified problems/goals with staff: Yes Medical problems stabilized or resolved: Yes Denies suicidal/homicidal ideation: Yes Issues/concerns per patient self-inventory: None Other: N/A  New problem(s) identified: None identified at this time.   New Short Term/Long Term Goal(s): "I've been really anxious.  I pace a lot. I was taking Seroquel but it wasn't doing anything for me.  Dr Betti Cruz told me to stop taking it."   Discharge Plan or Barriers:   Reason for Continuation of Hospitalization: Disorganization Paranoia Medication stabilization   Estimated Length of Stay: 07/17/17  Attendees: Patient:  07/15/2017  9:05 AM  Physician: Jomarie Longs, MD 07/15/2017  9:05 AM  Nursing: Roddie Mc, RN 07/15/2017  9:05 AM  RN Care Manager: Onnie Boer, RN 07/15/2017  9:05 AM  Social Worker: Richelle Ito 07/15/2017  9:05 AM  Recreational Therapist: Aggie Cosier 07/15/2017  9:05 AM  Other: Tomasita Morrow 07/15/2017  9:05 AM  Other:  07/15/2017  9:05 AM    Scribe for Treatment Team:  Daryel Gerald LCSW 07/15/2017 9:05 AM

## 2017-07-15 NOTE — Progress Notes (Signed)
D: Pt observed in the dayroom interacting with peers. Pt at the time of assessment endorsed moderate anxiety. Pt denied depression, SI, HI, AVH or pain; "today has been a good day; just a little anxious." Pt still paranoid when it comes to medication; "I don't think I have seen this one before." Pt remained calm and cooperative. A: Medications offered as prescribed. Pt given the opportunity to ask questions and state concerns. Support, encouragement, and safe environment provided. R: Pt was med compliant. All patient's questions and concerns addressed. 15-minute safety checks continue.

## 2017-07-15 NOTE — Progress Notes (Signed)
Recreation Therapy Notes  Date: 07/15/17 Time: 1000 Location: 500 Hall Dayroom  Group Topic: Coping Skills  Goal Area(s) Addresses:  Patients will be able to identify the benefits of positive coping skills. Patients will be able to identify the benefits of coping skills post d/c.  Behavioral Response: Minimal  Intervention:  Herbalist, pencils  Activity: Orthoptist.  Patients were given a blank web in which patients were to identify the things they have been struggling with that has kept them stuck.  Patients were to place those things inside the web.  Once those things were identified, patients were to identify at least 3 coping skills for each challenge they identified.  Education: Pharmacologist, Building control surveyor.   Education Outcome: Acknowledges understanding/In group clarification offered/Needs additional education.   Clinical Observations/Feedback: Pt didn't fill in the web but explained he had dealt with alcohol/drugs and being more humble.  Pt stated he has been clean for over a year and was able to accomplish that goal through "God, AA meetings and people".  Pt stated he is still working on being humble by learning and listening to others in his AA meetings discuss humility.   [name], LRT/CTRS         [name] A 07/15/2017 11:37 AM

## 2017-07-15 NOTE — Progress Notes (Signed)
Patient ID: Albert [name], male   DOB: May 31, 1976, 41 y.o.   MRN: [id number]  DAR: Pt. Denies SI/HI and A/V Hallucinations. He reports sleep is good, appetite is good, energy level is high, and concentration is good. He rates depression 0/10, hopelessness 0/10, and anxiety 5/10. Patient does not report any pain or discomfort at this time. Support and encouragement provided to the patient. Scheduled medications administered to patient per physician's orders. Patient presents with poor attention span and appears mildly confused. Writer introduces self and he questions, "why did you say Ashley Jacobs?" Patient also argues with Clinical research associate over the pronunciation of Protonix. Patient states, "I'm taking Plutonix." Patient appears confused in facial expression at times when approaching writer and looks at this writer's name badge. Patient is watchful and cautious when approaching staff. He does appear preoccupied at times and although he denies hallucinations, it is unclear at this time whether or not he is responding to internal stimuli. Q15 minute checks are maintained for safety.

## 2017-07-16 NOTE — Progress Notes (Signed)
[facility] MD Progress Note  07/16/2017 3:34 PM Albert [name]  MRN:  [id number]  Subjective:Pt states " I am OK. "    Objective:Patient seen and chart reviewed.Discussed patient with treatment team.  Pt today seen as less paranoid , has a brighter affect , response to questions are appropriate. Per RN , no disruptive issues noted on the unit. Continue treatment.     Principal Problem: Schizoaffective disorder, bipolar type (HCC)  Diagnosis:   Patient Active Problem List   Diagnosis Date Noted  . Schizoaffective disorder, bipolar type (HCC) [F25.0] 01/04/2017    Priority: High  . Alcohol use disorder, moderate, in sustained remission (HCC) [F10.21] 07/09/2017  . Cannabis use disorder, moderate, in sustained remission (HCC) [F12.21] 07/09/2017  . Elevated TSH [R94.6] 01/05/2017  . Tachycardia [R00.0] 01/05/2017  . Alcoholism (HCC) [F10.20]   . Drug abuse [F19.10]   . Medical non-compliance [Z91.19]    Total Time spent with patient: 20 minutes  Past Psychiatric History: Please see H&P.   Past Medical History:  Past Medical History:  Diagnosis Date  . Alcoholism (HCC)   . Drug abuse    xanax addiction 5 years ago  . Medical non-compliance   . Paranoid schizophrenia (HCC)   . Seizures (HCC)    History reviewed. No pertinent surgical history.  Family History: Please see H&P.  Family History  Problem Relation Age of Onset  . Depression Maternal Aunt    Family Psychiatric  History: Please see H&P.  Social History: Please see H&P.  History  Alcohol Use No    Comment: 11 months sober in March 2018     History  Drug Use No    Social History   Social History  . Marital status: Single    Spouse name: N/A  . Number of children: N/A  . Years of education: N/A   Social History Main Topics  . Smoking status: Former Games developer  . Smokeless tobacco: Never Used     Comment: Non-smoker  . Alcohol use No     Comment: 11 months sober in March 2018  . Drug use: No  .  Sexual activity: Not Currently   Other Topics Concern  . None   Social History Narrative  . None   Additional Social History:   Sleep: Fair  Appetite:  Fair  Current Medications: Current Facility-Administered Medications  Medication Dose Route Frequency Provider Last Rate Last Dose  . acetaminophen (TYLENOL) tablet 650 mg  650 mg Oral Q6H PRN Okonkwo, Justina A, NP      . alum & mag hydroxide-simeth (MAALOX/MYLANTA) 200-200-20 MG/5ML suspension 30 mL  30 mL Oral Q4H PRN Okonkwo, Justina A, NP      . divalproex (DEPAKOTE ER) 24 hr tablet 1,250 mg  1,250 mg Oral QHS Shinichi Anguiano, [name], MD   1,250 mg at 07/15/17 2122  . gabapentin (NEURONTIN) capsule 300 mg  300 mg Oral [id number] Okonkwo, Justina A, NP   300 mg at 07/16/17 1451  . hydrOXYzine (ATARAX/VISTARIL) tablet 25 mg  25 mg Oral TID PRN Okonkwo, Justina A, NP      . LORazepam (ATIVAN) tablet 1 mg  1 mg Oral Q6H PRN Jomarie Longs, MD       Or  . LORazepam (ATIVAN) injection 1 mg  1 mg Intramuscular Q6H PRN Annarose Ouellet, MD      . magnesium hydroxide (MILK OF MAGNESIA) suspension 30 mL  30 mL Oral Daily PRN Okonkwo, Justina A, NP      . pantoprazole (  PROTONIX) EC tablet 40 mg  40 mg Oral BID AC Okonkwo, Justina A, NP   40 mg at 07/16/17 0624  . propranolol (INDERAL) tablet 10 mg  10 mg Oral BID Jomarie Longs, MD   10 mg at 07/16/17 0830  . QUEtiapine (SEROQUEL) tablet 300 mg  300 mg Oral QHS Armandina Stammer I, NP   300 mg at 07/15/17 2122  . QUEtiapine (SEROQUEL) tablet 50 mg  50 mg Oral BID PRN Jomarie Longs, MD       Lab Results:  No results found for this or any previous visit (from the past 48 hour(s)). Blood Alcohol level:  Lab Results  Component Value Date   ETH <5 07/07/2017   ETH <5 07/04/2017   Metabolic Disorder Labs: Lab Results  Component Value Date   HGBA1C 5.1 01/05/2017   MPG 100 01/05/2017   Lab Results  Component Value Date   PROLACTIN 30.4 (H) 01/06/2017   PROLACTIN 13.1 01/05/2017   Lab  Results  Component Value Date   CHOL 172 01/05/2017   TRIG 112 01/05/2017   HDL 41 01/05/2017   CHOLHDL 4.2 01/05/2017   VLDL 22 01/05/2017   LDLCALC 109 (H) 01/05/2017   Physical Findings: AIMS: Facial and Oral Movements Muscles of Facial Expression: None, normal Lips and Perioral Area: None, normal Jaw: None, normal Tongue: None, normal,Extremity Movements Upper (arms, wrists, hands, fingers): None, normal Lower (legs, knees, ankles, toes): None, normal, Trunk Movements Neck, shoulders, hips: None, normal, Overall Severity Severity of abnormal movements (highest score from questions above): None, normal Incapacitation due to abnormal movements: None, normal Patient's awareness of abnormal movements (rate only patient's report): No Awareness, Dental Status Current problems with teeth and/or dentures?: No Does patient usually wear dentures?: No  CIWA:    COWS:     Musculoskeletal: Strength & Muscle Tone: within normal limits Gait & Station: normal Patient leans: N/A  Psychiatric Specialty Exam: Physical Exam  Nursing note and vitals reviewed.   Review of Systems  Psychiatric/Behavioral: The patient is nervous/anxious.   All other systems reviewed and are negative.   Blood pressure 111/66, pulse 91, temperature 97.8 F (36.6 C), temperature source Oral, resp. rate 16, height  (1.778 m), weight 100.2 kg (221 lb).Body mass index is 31.71 kg/m.  General Appearance: Fairly Groomed  Eye Contact:  Fair  Speech:  Normal Rate  Volume:  Normal  Mood:  Anxious improving  Affect:  Congruent  Thought Process:  Goal Directed and Descriptions of Associations: Circumstantial  Orientation:  Full (Time, Place, and Person)  Thought Content:  Paranoid Ideation, improving  Suicidal Thoughts:  No  Homicidal Thoughts:  No  Memory:  Immediate;   Fair Recent;   Fair Remote;   Fair  Judgement:  Fair  Insight:  Shallow  Psychomotor Activity:  Normal  Concentration:   Concentration: Fair and Attention Span: Fair  Recall:  [employer] of Knowledge:  Fair  Language:  Fair  Akathisia:  No  Handed:  Right  AIMS (if indicated):     Assets:  Desire for Improvement Physical Health Resilience Social Support  ADL's:  Intact  Cognition:  WNL  Sleep:  Number of Hours: 6.25   Will continue today 07/16/17 plan as below except where it is noted.    Treatment Plan Summary: Patient with schizoaffective do , continue to be making progress , continue treatment.   Daily contact with patient to assess and evaluate symptoms and progress in treatment, Medication management and Plan  see below   -Continue Depakote ER 1250 mg po daily at bedtime for mood lability. Depakote level - 84 -07/13/2017. -Continue Neurontin 300 mg PO bid for anxiety sx . -Continue Seroquel 300 mg po qhs for psychosis, mood sx due to worsening psychosis. -Continue Propranolol 10 mg po bid for increased heart rate .  -CSW will continue to work on disposition.  Eryk Beavers, MD 07/16/2017, 3:34 PM

## 2017-07-16 NOTE — Plan of Care (Signed)
Problem: Education: Goal: Will be free of psychotic symptoms Outcome: Progressing Nurse discussed depression/anxiety/coping skills with patient.   

## 2017-07-16 NOTE — Progress Notes (Signed)
Recreation Therapy Notes    Date: 07/16/17 Time: 1000 Location: [address] Dayroom  Group Topic: Anger Management  Goal Area(s) Addresses:  Patient will identify triggers for anger.  Patient will identify physical reaction to anger.   Patient will identify benefit of using coping skills when angry.  Behavioral Response: Engaged  Intervention: Worksheet, pencils  Activity: Anger Thermometer.  Patients were given a blank worksheet of a thermometer.  Patients were to rank 5 situations in which they became angry from 1-10 (1 being the lowest, 10 being the highest) on the thermometer.  Patients were to come up with at least one coping skill for each situation they identified.    Education: Anger Management, Discharge Planning   Education Outcome: Acknowledges education/In group clarification offered/Needs additional education.   Clinical Observations/Feedback: Pt stated when he's angry, he gets happy and he tries to make something positive out of it.   Pt also stated one of his coping skills was crying.    [name], LRT/CTRS            Lillia Abed, Kalil Woessner A 07/16/2017 11:11 AM

## 2017-07-16 NOTE — Progress Notes (Signed)
D:  Patient's self inventory sheet, patient sleeps good, no sleep medication given.  Good appetite, high energy level, good concentration.  Denied depression and hopeless and anxiety #5.  Denied withdrawals.  Denied SI.  Denied physical problems.  Denied physical pain.  Goal is discharge plan.  Plans to talk to MD.  Plans to be good.  Does have discharge plans. A:  Medications administered per MD orders.  Emotional support and encouragement given patient. R:  Denied SI and HI, contracts for safety.  Denied A/V hallucinations.  Safety maintained with 15 minute checks.

## 2017-07-16 NOTE — Plan of Care (Signed)
Problem: Safety: Goal: Periods of time without injury will increase Outcome: Progressing Pt safe on the unit at this time   

## 2017-07-16 NOTE — Progress Notes (Signed)
Recreation Therapy Notes  Animal-Assisted Activity (AAA) Program Checklist/Progress Notes Patient Eligibility Criteria Checklist & Daily Group note for Rec Tx Intervention  Date: 09.18.2018 Time: 2:45pm Location: [address]    AAA/T Program Assumption of Risk Form signed by Patient/ or Parent Legal Guardian Yes  Patient is free of allergies or sever asthma Yes  Patient reports no fear of animals Yes  Patient reports no history of cruelty to animals Yes  Patient understands his/her participation is voluntary Yes  Patient washes hands before animal contact Yes  Patient washes hands after animal contact Yes  Behavioral Response: Appropriate    Education: Hand Washing, Appropriate Animal Interaction   Education Outcome: Acknowledges education.   Clinical Observations/Feedback: Patient discussed with MD for appropriateness in pet therapy session. Both LRT and MD agree patient is appropriate for participation. Patient offered participation in session and signed necessary consent form without issue. Patient attended session, pet therapy dog appropriately from floor level and made no statements during his time in group. Patient affect flat.   Albert [name], LRT/CTRS           [name] 07/16/2017 3:08 PM

## 2017-07-16 NOTE — Progress Notes (Signed)
D: Pt denies SI/HI/AVH. Pt is pleasant and cooperative. Pt stated he was ready to go, pt said God will tell him what he needs to do . Pt spent a lot of time out in the dayroom this evening. Pt concerned that he is having thoughts from time to time that appear to be racing , pt said he was confused.    A: Pt was offered support and encouragement. Pt was given scheduled medications. Pt was encourage to attend groups. Q 15 minute checks were done for safety.   R:Pt attends groups and interacts well with peers and staff. Pt is taking medication. Pt has no complaints.Pt receptive to treatment and safety maintained on unit.

## 2017-07-16 NOTE — Progress Notes (Signed)
Adult Psychoeducational Group Note  Date:  07/16/2017 Time:  8:27 PM  Group Topic/Focus:  Wrap-Up Group:   The focus of this group is to help patients review their daily goal of treatment and discuss progress on daily workbooks.  Participation Level:  Active  Participation Quality:  Appropriate  Affect:  Appropriate  Cognitive:  Alert  Insight: Appropriate  Engagement in Group:  Engaged  Modes of Intervention:  Discussion  Additional Comments: Pt stated that today was a simple day. He stated he had a lot of different thought patterns. His goal is to learn more about who is he.   Kaleen Odea R 07/16/2017, 8:27 PM

## 2017-07-16 NOTE — Plan of Care (Signed)
Problem: Coping: Goal: Ability to cope will improve Outcome: Progressing Pt denies AVH at this time   

## 2017-07-16 NOTE — [facility] Group Notes (Signed)
LCSW Group Therapy Note   07/16/2017 1:15pm   Type of Therapy and Topic:  Group Therapy:  Positive Affirmations   Participation Level:  Minimal  Description of Group: This group addressed positive affirmation toward self and others. Patients went around the room and identified two positive things about themselves and two positive things about a peer in the room. Patients reflected on how it felt to share something positive with others, to identify positive things about themselves, and to hear positive things from others. Patients were encouraged to have a daily reflection of positive characteristics or circumstances.  Therapeutic Goals 1. Patient will verbalize two of their positive qualities 2. Patient will demonstrate empathy for others by stating two positive qualities about a peer in the group 3. Patient will verbalize their feelings when voicing positive self affirmations and when voicing positive affirmations of others 4. Patients will discuss the potential positive impact on their wellness/recovery of focusing on positive traits of self and others. Summary of Patient Progress:  Stayed the entire time, engaged throughout.  "I wish I could hold my thoughts. I seem to be having difficulty with that."    Therapeutic Modalities Cognitive Behavioral Therapy Motivational Interviewing  [name], [place] 07/16/2017 3:54 PM

## 2017-07-17 MED ORDER — QUETIAPINE FUMARATE 300 MG PO TABS: 300.0000 mg | ORAL_TABLET | Freq: Every day | ORAL | 0 refills | Status: DC

## 2017-07-17 MED ORDER — QUETIAPINE FUMARATE 50 MG PO TABS: 50.0000 mg | ORAL_TABLET | Freq: Two times a day (BID) | ORAL | 0 refills | Status: DC | PRN

## 2017-07-17 MED ORDER — DIVALPROEX SODIUM ER 250 MG PO TB24: 1250.0000 mg | ORAL_TABLET | Freq: Every day | ORAL | 0 refills | Status: DC

## 2017-07-17 MED ORDER — PROPRANOLOL HCL 10 MG PO TABS: 10.0000 mg | ORAL_TABLET | Freq: Two times a day (BID) | ORAL | 0 refills | Status: DC

## 2017-07-17 MED ORDER — GABAPENTIN 300 MG PO CAPS
300.0000 mg | ORAL_CAPSULE | ORAL | 0 refills | Status: DC
Start: 2017-07-17 — End: 2019-04-29

## 2017-07-17 NOTE — Plan of Care (Signed)
Problem: [facility] Participation in Recreation Therapeutic Interventions Goal: STG-Patient will demonstrate improved self esteem by identif STG: Self-Esteem - Patient will improve self-esteem, as demonstrated by ability to identify at least 5 positive qualities about him/herself by conclusion of recreation therapy tx  Outcome: Completed/Met Date Met: 07/17/17 Pt was able to identify positive qualities at completion of self-esteem recreation therapy session.  Victorino Sparrow, LRT/CTRS

## 2017-07-17 NOTE — Progress Notes (Signed)
Patient ID: Albert [name], male   DOB: [birth year], 41 y.o.   MRN: [id number] Patient provided with all of his discharge instructions, provided with his prescription scripts, educated on discharge instructions and on AVS, verbalizes understanding.  Patient was provided with transportation by his mother who met him at the reception area of the hospital at 1.30pm.

## 2017-07-17 NOTE — Discharge Summary (Signed)
Physician Discharge Summary Note  Patient:  Albert [name] is an 41 y.o., male MRN:  [id number] DOB:  28-Mar-1976 Patient phone:  [phone] (home)  Patient address:   [address] [place] [address],  Total Time spent with patient: 30 minutes  Date of Admission:  07/08/2017 Date of Discharge: 09/192018  Reason for Admission: Per HPI-Albert [name] is a 5 y old CM, who is single , lives by self in [place] , has a hx of schizoaffective do, presented to [facility] by his mother for worsening psychosis.Patient seen and chart reviewed.Discussed patient with treatment team. Pt reported that he has been hearing voices - does not elaborate . Pt also is delusional , felt his medications were poisoned , he stopped taking them. P has been seeing shapes and clouds . Pt presented as bizarre, irrelevant , some thought blocking as well as loose thought process.Pt reported mood swings , racing thoughts . Pt is on Depakote and seroquel, wants to stay on the same . He thinks he just needs to restart taking the medications the way he used to do it and he will be OK.    Principal Problem: Schizoaffective disorder, bipolar type [facility]) Discharge Diagnoses: Patient Active Problem List   Diagnosis Date Noted  . Alcohol use disorder, moderate, in sustained remission (HCC) [F10.21] 07/09/2017  . Cannabis use disorder, moderate, in sustained remission (HCC) [F12.21] 07/09/2017  . Elevated TSH [R94.6] 01/05/2017  . Tachycardia [R00.0] 01/05/2017  . Schizoaffective disorder, bipolar type (HCC) [F25.0] 01/04/2017  . Alcoholism (HCC) [F10.20]   . Drug abuse [F19.10]   . Medical non-compliance [Z91.19]     Past Psychiatric History:   Past Medical History:  Past Medical History:  Diagnosis Date  . Alcoholism (HCC)   . Drug abuse    xanax addiction 5 years ago  . Medical non-compliance   . Paranoid schizophrenia (HCC)   . Seizures (HCC)    History reviewed. No pertinent surgical history. Family History:   Family History  Problem Relation Age of Onset  . Depression Maternal Aunt    Family Psychiatric  History:  Social History:  History  Alcohol Use No    Comment: 11 months sober in March 2018     History  Drug Use No    Social History   Social History  . Marital status: Single    Spouse name: N/A  . Number of children: N/A  . Years of education: N/A   Social History Main Topics  . Smoking status: Former Games developer  . Smokeless tobacco: Never Used     Comment: Non-smoker  . Alcohol use No     Comment: 11 months sober in March 2018  . Drug use: No  . Sexual activity: Not Currently   Other Topics Concern  . None   Social History Narrative  . None    Hospital Course:  Albert [name] was admitted for Schizoaffective disorder, bipolar type [facility]) and crisis management.  Pt was treated discharged with the medications listed below under Medication List.  Medical problems were identified and treated as needed.  Home medications were restarted as appropriate.  Improvement was monitored by observation and Albert [name] 's daily report of symptom reduction.  Emotional and mental status was monitored by daily self-inventory reports completed by Albert [name] and clinical staff.         Albert [name] was evaluated by the treatment team for stability and plans for continued recovery upon discharge. Albert [name] 's motivation was an  integral factor for scheduling further treatment. Employment, transportation, bed availability, health status, family support, and any pending legal issues were also considered during hospital stay. Pt was offered further treatment options upon discharge including but not limited to Residential, Intensive Outpatient, and Outpatient treatment.  Albert [name] will follow up with the services as listed below under Follow Up Information.     Upon completion of this admission the patient was both mentally and medically stable for  discharge denying suicidal/homicidal ideation, auditory/visual/tactile hallucinations, delusional thoughts and paranoia.   Albert [name] responded well to treatment with Depakote 1250, Neurontin 300 mg and Seroquel 300 mg without adverse effects. Pt demonstrated improvement without reported or observed adverse effects to the point of stability appropriate for outpatient management. Pertinent labs include: CMP and CBC, for which outpatient follow-up is necessary for lab recheck as mentioned below. Reviewed CBC, CMP, BAL, and UDS; all unremarkable aside from noted exceptions.   Physical Findings: AIMS: Facial and Oral Movements Muscles of Facial Expression: None, normal Lips and Perioral Area: None, normal Jaw: None, normal Tongue: None, normal,Extremity Movements Upper (arms, wrists, hands, fingers): None, normal Lower (legs, knees, ankles, toes): None, normal, Trunk Movements Neck, shoulders, hips: None, normal, Overall Severity Severity of abnormal movements (highest score from questions above): None, normal Incapacitation due to abnormal movements: None, normal Patient's awareness of abnormal movements (rate only patient's report): No Awareness, Dental Status Current problems with teeth and/or dentures?: No Does patient usually wear dentures?: No  CIWA:  CIWA-Ar Total: 1 COWS:  COWS Total Score: 2  Musculoskeletal: Strength & Muscle Tone: within normal limits Gait & Station: normal Patient leans: N/A  Psychiatric Specialty Exam: See SRA by MD Physical Exam  Vitals reviewed. Constitutional: He is oriented to person, place, and time.  Cardiovascular: Normal rate.   Neurological: He is alert and oriented to person, place, and time.  Psychiatric: He has a normal mood and affect. His behavior is normal.    Review of Systems  Psychiatric/Behavioral: Negative for depression (stable), hallucinations and suicidal ideas.    Blood pressure 106/66, pulse (!) 105, temperature 97.9 F  (36.6 C), temperature source Oral, resp. rate 16, height  (1.778 m), weight 100.2 kg (221 lb).Body mass index is 31.71 kg/m.  Have you used any form of tobacco in the last 30 days? (Cigarettes, Smokeless Tobacco, Cigars, and/or Pipes): No  Has this patient used any form of tobacco in the last 30 days? (Cigarettes, Smokeless Tobacco, Cigars, and/or Pipes)  No  Blood Alcohol level:  Lab Results  Component Value Date   ETH <5 07/07/2017   ETH <5 07/04/2017    Metabolic Disorder Labs:  Lab Results  Component Value Date   HGBA1C 5.1 01/05/2017   MPG 100 01/05/2017   Lab Results  Component Value Date   PROLACTIN 30.4 (H) 01/06/2017   PROLACTIN 13.1 01/05/2017   Lab Results  Component Value Date   CHOL 172 01/05/2017   TRIG 112 01/05/2017   HDL 41 01/05/2017   CHOLHDL 4.2 01/05/2017   VLDL 22 01/05/2017   LDLCALC 109 (H) 01/05/2017    See Psychiatric Specialty Exam and Suicide Risk Assessment completed by Attending Physician prior to discharge.  Discharge destination:  Home  Is patient on multiple antipsychotic therapies at discharge:  No   Has Patient had three or more failed trials of antipsychotic monotherapy by history:  No  Recommended Plan for Multiple Antipsychotic Therapies: NA  Discharge Instructions    Diet - low sodium heart healthy  Complete by:  As directed    Discharge instructions    Complete by:  As directed    Take all medications as prescribed. Keep all follow-up appointments as scheduled.  Do not consume alcohol or use illegal drugs while on prescription medications. Report any adverse effects from your medications to your primary care provider promptly.  In the event of recurrent symptoms or worsening symptoms, call 911, a crisis hotline, or go to the nearest emergency department for evaluation.   Increase activity slowly    Complete by:  As directed      Allergies as of 07/17/2017   No Known Allergies     Medication List    STOP  taking these medications   benztropine 0.5 MG tablet Commonly known as:  COGENTIN   hydrOXYzine 25 MG tablet Commonly known as:  ATARAX/VISTARIL   QUEtiapine Fumarate 150 MG 24 hr tablet Commonly known as:  SEROQUEL XR Replaced by:  QUEtiapine 50 MG tablet You also have another medication with the same name that you need to continue taking as instructed.   zolpidem 5 MG tablet Commonly known as:  AMBIEN     TAKE these medications     Indication  divalproex 250 MG 24 hr tablet Commonly known as:  DEPAKOTE ER Take 5 tablets (1,250 mg total) by mouth at bedtime. What changed:  Another medication with the same name was removed. Continue taking this medication, and follow the directions you see here.  Indication:  Manic Phase of Manic-Depression   gabapentin 300 MG capsule Commonly known as:  NEURONTIN Take 1 capsule (300 mg total) by mouth 3 (three) times daily at 8am, 3pm and bedtime. What changed:  Another medication with the same name was removed. Continue taking this medication, and follow the directions you see here.  Indication:  Agitation, Neuropathic Pain   pantoprazole 40 MG tablet Commonly known as:  PROTONIX Take 1 tablet (40 mg total) by mouth 2 (two) times daily before a meal.  Indication:  Excess Stomach Secretions   propranolol 10 MG tablet Commonly known as:  INDERAL Take 1 tablet (10 mg total) by mouth 2 (two) times daily. What changed:  when to take this  Indication:  High Blood Pressure Disorder   QUEtiapine 300 MG tablet Commonly known as:  SEROQUEL Take 1 tablet (300 mg total) by mouth at bedtime. What changed:  medication strength  how much to take  Another medication with the same name was removed. Continue taking this medication, and follow the directions you see here.  Indication:  Depressive Phase of Manic-Depression, mood stabilization   QUEtiapine 50 MG tablet Commonly known as:  SEROQUEL Take 1 tablet (50 mg total) by mouth 2 (two) times  daily as needed (severe anxiety/agitation). What changed:  You were already taking a medication with the same name, and this prescription was added. Make sure you understand how and when to take each. Replaces:  QUEtiapine Fumarate 150 MG 24 hr tablet  Indication:  Depressive Phase of Manic-Depression      Follow-up Information    [facility] Follow up on 07/22/2017.   Specialty:  Behavioral Health Why:  Monday at 10AM. Let them know you are there for a meeting with the ACT team. Contact information: [address] [place] [address] [phone]           Follow-up recommendations:  Activity:  as tolerated Diet:  heart healthy  Comments:  Take all medications as prescribed. Keep all follow-up appointments as scheduled.  Do not consume alcohol or use illegal drugs while on prescription medications. Report any adverse effects from your medications to your primary care provider promptly.  In the event of recurrent symptoms or worsening symptoms, call 911, a crisis hotline, or go to the nearest emergency department for evaluation.   Signed: Oneta Rack, NP 07/17/2017, 10:27 AM

## 2017-07-17 NOTE — Progress Notes (Signed)
  [facility] Adult Case Management Discharge Plan :  Will you be returning to the same living situation after discharge:  Yes,  home At discharge, do you have transportation home?: Yes,  family Do you have the ability to pay for your medications: Yes,  MCD  Release of information consent forms completed and in the chart;  Patient's signature needed at discharge.  Patient to Follow up at: Follow-up Information    [facility] Follow up on 07/22/2017.   Specialty:  Behavioral Health Why:  Monday at 10AM. Let them know you are there for a meeting with the ACT team. Contact information: [address] [place] [address] [phone]           Next level of care provider has access to [facility] Link:no  Safety Planning and Suicide Prevention discussed: Yes,  yes  Have you used any form of tobacco in the last 30 days? (Cigarettes, Smokeless Tobacco, Cigars, and/or Pipes): No  Has patient been referred to the Quitline?: N/A patient is not a smoker  Patient has been referred for addiction treatment: N/A  [name], LCSW 07/17/2017, 10:52 AM

## 2017-07-17 NOTE — Plan of Care (Signed)
Problem: Activity: Goal: Interest or engagement in activities will improve Outcome: Progressing Patient visible in the milieu, reclusive most of the time to his room, but observed to have minimal interactions with his peers. Goal: Sleeping patterns will improve Outcome: Progressing Patient reports that he slept "fairly well" last night, reports his energy level as "high", denies feeling hopeless, states that his concentration level is good and his appetite is good.  Pt denies any pain, denies SI/HI/AVH, and verbalizes readiness for discharge.  Patient lists his goal as "discharge plan", and states that he has contributed so far in his discharge planning process.  Pt has been educated on his after visit summary, and has verbalized understanding.    Problem: Education: Goal: Verbalization of understanding the information provided will improve Outcome: Progressing Patient has verbalized understanding of education on his medications and discharge plan.  Patient took all of his medications this morning as scheduled.  Comments: Pt being maintained on Q15 minute checks for safety, is calm/cooperative with care, reports being ready for discharge.  Discharge orders for pt have been placed, pt has been educated on discharge plan, and has verbalized understanding, currently awaiting pt's mother to provide transportation home as per SW.  Will continue to monitor.

## 2017-07-17 NOTE — Progress Notes (Signed)
Recreation Therapy Notes  INPATIENT RECREATION TR PLAN  Patient Details Name: Albert [name] MRN: [id number] DOB: Mar 26, 1976 Today's Date: 07/17/2017  Rec Therapy Plan Is patient appropriate for Therapeutic Recreation?: Yes Treatment times per week: about 3 days Estimated Length of Stay: 5-7 days TR Treatment/Interventions: Group participation (Comment)  Discharge Criteria Pt will be discharged from therapy if:: Discharged Treatment plan/goals/alternatives discussed and agreed upon by:: Patient/family  Discharge Summary Short term goals set: Patient will improve self-esteem, as demonstrated by ability to identify at least 5 positive qualities about him/herself by conclusion of recreation therapy tx  Short term goals met: Complete Progress toward goals comments: Groups attended Which groups?: Self-esteem, Coping skills, Leisure education, Communication, Anger management, Stress management, Other (Comment) (Teambuilding) Reason goals not met: None Therapeutic equipment acquired: N/A Reason patient discharged from therapy: Discharge from hospital Pt/family agrees with progress & goals achieved: Yes Date patient discharged from therapy: 07/17/17   Victorino Sparrow, LRT/CTRS  Ria Comment, Allie Ousley A 07/17/2017, 1:22 PM

## 2017-07-17 NOTE — BHH Suicide Risk Assessment (Signed)
[facility] Discharge Suicide Risk Assessment   Principal Problem: Schizoaffective disorder, bipolar type [facility]) Discharge Diagnoses:  Patient Active Problem List   Diagnosis Date Noted  . Schizoaffective disorder, bipolar type (HCC) [F25.0] 01/04/2017    Priority: High  . Alcohol use disorder, moderate, in sustained remission (HCC) [F10.21] 07/09/2017  . Cannabis use disorder, moderate, in sustained remission (HCC) [F12.21] 07/09/2017  . Elevated TSH [R94.6] 01/05/2017  . Tachycardia [R00.0] 01/05/2017  . Alcoholism (HCC) [F10.20]   . Drug abuse [F19.10]   . Medical non-compliance [Z91.19]     Total Time spent with patient: 30 minutes  Musculoskeletal: Strength & Muscle Tone: within normal limits Gait & Station: normal Patient leans: N/A  Psychiatric Specialty Exam: Review of Systems  Psychiatric/Behavioral: Negative for depression, hallucinations and suicidal ideas.  All other systems reviewed and are negative.   Blood pressure 106/66, pulse (!) 105, temperature 97.9 F (36.6 C), temperature source Oral, resp. rate 16, height  (1.778 m), weight 100.2 kg (221 lb).Body mass index is 31.71 kg/m.  General Appearance: Casual  Eye Contact::  Fair  Speech:  Clear and Coherent409  Volume:  Normal  Mood:  Euthymic  Affect:  Congruent  Thought Process:  Goal Directed and Descriptions of Associations: Intact  Orientation:  Full (Time, Place, and Person)  Thought Content:  Logical  Suicidal Thoughts:  No  Homicidal Thoughts:  No  Memory:  Immediate;   Fair Recent;   Fair Remote;   Fair  Judgement:  Fair  Insight:  Fair  Psychomotor Activity:  Normal  Concentration:  Fair  Recall:  [employer] of Knowledge:Fair  Language: Fair  Akathisia:  No  Handed:  Right  AIMS (if indicated):     Assets:  Communication Skills Desire for Improvement  Sleep:  Number of Hours: 6.25  Cognition: WNL  ADL's:  Intact   Mental Status Per Nursing Assessment::   On Admission:   NA  Demographic Factors:  Male and Caucasian  Loss Factors: NA  Historical Factors: Impulsivity  Risk Reduction Factors:   Positive social support, Positive therapeutic relationship and Positive coping skills or problem solving skills  Continued clinical sx: Needs continued medication and follow up pn an out patient basis   Cognitive Features That Contribute To Risk:  None    Suicide Risk:  Minimal: No identifiable suicidal ideation.  Patients presenting with no risk factors but with morbid ruminations; may be classified as minimal risk based on the severity of the depressive symptoms  Follow-up Information    [facility] Follow up on 07/22/2017.   Specialty:  Behavioral Health Why:  Monday at 10AM. Let them know you are there for a meeting with the ACT team. Contact information: [address] [place] [address] [phone]           Plan Of Care/Follow-up recommendations:  Activity:  no restrictions Diet:  regular Tests:  as needed Other:  follow up with aftercare  Levonia Wolfley, MD 07/17/2017, 10:05 AM

## 2017-07-17 NOTE — Progress Notes (Signed)
Recreation Therapy Notes  Date: 07/17/17 Time: 1000 Location: 500 Hall Dayroom  Group Topic: Communication  Goal Area(s) Addresses:  Patient will effectively communicate with peers in group.  Patient will verbalize benefit of healthy communication. Patient will verbalize positive effect of healthy communication on post d/c goals.  Patient will identify communication techniques that made activity effective for group.   Behavioral Response: Engaged  Intervention:  Futures trader, blank paper and pencils   Activity: Acupuncturist.  Group was divided into teams of 2 and seated back to back.  One person was the listener and the other was the talker but each person would get a chance to do both roles.  The talker was given a picture of geometrical shapes which they had to describe to the listener.  The listener had to draw the picture as it was described to them.  Education: Communication, Discharge Planning  Education Outcome: Acknowledges understanding/In group clarification offered/Needs additional education.   Clinical Observations/Feedback:  Pt was active and appropriate during group.  Pt would daze off at times and have to be redirected to refocus.  Pt explained being the talker was easy for him because he thought his partner was getting all the directions he was giving.  Pt also stated nothing was harder about being the listener.     [name], LRT/CTRS       [name] A 07/17/2017 11:31 AM

## 2017-11-20 DIAGNOSIS — Z87891 Personal history of nicotine dependence: Secondary | ICD-10-CM | POA: Insufficient documentation

## 2017-11-20 DIAGNOSIS — R443 Hallucinations, unspecified: Secondary | ICD-10-CM | POA: Insufficient documentation

## 2017-11-20 DIAGNOSIS — F250 Schizoaffective disorder, bipolar type: Secondary | ICD-10-CM | POA: Diagnosis not present

## 2017-11-20 DIAGNOSIS — F29 Unspecified psychosis not due to a substance or known physiological condition: Secondary | ICD-10-CM | POA: Diagnosis present

## 2017-11-21 ENCOUNTER — Emergency Department (HOSPITAL_COMMUNITY)
Admission: EM | Admit: 2017-11-21 | Discharge: 2017-11-21 | Disposition: A | Discharge status: Other Acute Inpatient Hospital | Payer: Medicare Other | Attending: Emergency Medicine | Admitting: Emergency Medicine

## 2017-11-21 ENCOUNTER — Encounter (HOSPITAL_COMMUNITY): Payer: Self-pay

## 2017-11-21 ENCOUNTER — Other Ambulatory Visit: Payer: Self-pay

## 2017-11-21 DIAGNOSIS — F250 Schizoaffective disorder, bipolar type: Secondary | ICD-10-CM | POA: Diagnosis not present

## 2017-11-21 LAB — COMPREHENSIVE METABOLIC PANEL
ALBUMIN: 4.4 g/dL (ref 3.5–5.0)
ALK PHOS: 56 U/L (ref 38–126)
ALT: 16 U/L — AB (ref 17–63)
ANION GAP: 8 (ref 5–15)
AST: 19 U/L (ref 15–41)
BUN: 9 mg/dL (ref 6–20)
CALCIUM: 9.1 mg/dL (ref 8.9–10.3)
CHLORIDE: 107 mmol/L (ref 101–111)
CO2: 23 mmol/L (ref 22–32)
Creatinine, Ser: 0.7 mg/dL (ref 0.61–1.24)
GFR calc Af Amer: >60 mL/min (ref >60)
GFR calc non Af Amer: >60 mL/min (ref >60)
GLUCOSE: 102 mg/dL — AB (ref 65–99)
Potassium: 3.7 mmol/L (ref 3.5–5.1)
SODIUM: 138 mmol/L (ref 135–145)
Total Bilirubin: 0.5 mg/dL (ref 0.3–1.2)
Total Protein: 7.8 g/dL (ref 6.5–8.1)

## 2017-11-21 LAB — ETHANOL: Alcohol, Ethyl (B): <10 mg/dL (ref <10)

## 2017-11-21 LAB — RAPID URINE DRUG SCREEN, HOSP PERFORMED
AMPHETAMINES: NOT DETECTED
BARBITURATES: NOT DETECTED
Benzodiazepines: NOT DETECTED
Cocaine: NOT DETECTED
Opiates: NOT DETECTED
Tetrahydrocannabinol: NOT DETECTED

## 2017-11-21 LAB — CBC
HEMATOCRIT: 44.5 % (ref 39.0–52.0)
Hemoglobin: 16.1 g/dL (ref 13.0–17.0)
MCH: 34.1 pg — AB (ref 26.0–34.0)
MCHC: 36.2 g/dL — ABNORMAL HIGH (ref 30.0–36.0)
MCV: 94.3 fL (ref 78.0–100.0)
PLATELETS: 250 10*3/uL (ref 150–400)
RBC: 4.72 MIL/uL (ref 4.22–5.81)
RDW: 13.9 % (ref 11.5–15.5)
WBC: 9 10*3/uL (ref 4.0–10.5)

## 2017-11-21 LAB — SALICYLATE LEVEL

## 2017-11-21 LAB — VALPROIC ACID LEVEL: Valproic Acid Lvl: 45 ug/mL — ABNORMAL LOW (ref 50.0–100.0)

## 2017-11-21 LAB — ACETAMINOPHEN LEVEL

## 2017-11-21 MED ORDER — ALUM & MAG HYDROXIDE-SIMETH 200-200-20 MG/5ML PO SUSP: 30.0000 mL | Freq: Four times a day (QID) | ORAL | Status: DC | PRN

## 2017-11-21 MED ORDER — PANTOPRAZOLE SODIUM 40 MG PO TBEC
40.0000 mg | DELAYED_RELEASE_TABLET | Freq: Two times a day (BID) | ORAL | Status: DC
  Administered 2017-11-21: 40 mg via ORAL
  Filled 2017-11-21: qty 1

## 2017-11-21 MED ORDER — DIVALPROEX SODIUM ER 500 MG PO TB24
1500.0000 mg | ORAL_TABLET | Freq: Every day | ORAL | Status: DC
  Administered 2017-11-21: 1500 mg via ORAL
  Filled 2017-11-21: qty 3

## 2017-11-21 MED ORDER — QUETIAPINE FUMARATE 100 MG PO TABS: 200.0000 mg | ORAL_TABLET | Freq: Every day | ORAL | Status: DC

## 2017-11-21 MED ORDER — IBUPROFEN 200 MG PO TABS: 600.0000 mg | ORAL_TABLET | Freq: Three times a day (TID) | ORAL | Status: DC | PRN

## 2017-11-21 NOTE — ED Notes (Signed)
Bed: [facility]4 Expected date:  Expected time:  Means of arrival:  Comments: 

## 2017-11-21 NOTE — BH Assessment (Signed)
[facility]  Per Juanetta BeetsJacqueline Norman, DO, this pt requires psychiatric hospitalization at this time.  At 14:11 Christiane HaJonathan [facility].  Pt has been accepted to their facility by Ellamae SiaKeshavpal Reddy, MD.  Nanine MeansJamison Lord, DNP, concurs with this disposition, as does the pt who is currently under voluntary status.  Pt's nurse, Donnal DebarRandi, has been notified, and agrees to call report to [phone].  [facility]; [facility].  Doylene Canninghomas Kourtnei Rauber, [place] Behavioral Health Coordinator [phone]
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
## 2017-11-21 NOTE — ED Provider Notes (Signed)
[facility]-EMERGENCY DEPT Provider Note   CSN: [id number] Arrival date & time: 11/20/17  2353     History   Chief Complaint Chief Complaint  Patient presents with  . Psychiatric Evaluation    HPI [name] is a 42 y.o. male.  The history is provided by the patient.  Mental Health Problem  Presenting symptoms: disorganized speech, disorganized thought process and hallucinations   Presenting symptoms: no self-mutilation and no suicidal thoughts   Presenting symptoms comment:  Thought broadcasting,  Patient accompanied by:  Family member Degree of incapacity (severity):  Moderate Onset quality:  Gradual Timing:  Constant Progression:  Worsening Chronicity:  Recurrent Context: not medication   Treatment compliance:  Some of the time Relieved by:  Nothing Worsened by:  Nothing Ineffective treatments:  None tried Associated symptoms: no abdominal pain and no decreased need for sleep   Risk factors: hx of mental illness   Hearing someone from his AA group talking about Hell and his image in the mirror is suboptimal.  Past Medical History:  Diagnosis Date  . Alcoholism (HCC)   . Drug abuse (HCC)    xanax addiction 5 years ago  . Medical non-compliance   . Paranoid schizophrenia (HCC)   . Seizures [facility](HCC)     Patient Active Problem List   Diagnosis Date Noted  . Alcohol use disorder, moderate, in sustained remission (HCC) 07/09/2017  . Cannabis use disorder, moderate, in sustained remission (HCC) 07/09/2017  . Elevated TSH 01/05/2017  . Tachycardia 01/05/2017  . Schizoaffective disorder, bipolar type (HCC) 01/04/2017  . Alcoholism (HCC)   . Drug abuse (HCC)   . Medical non-compliance     History reviewed. No pertinent surgical history.     Home Medications    Prior to Admission medications   Medication Sig Start Date End Date Taking? Authorizing Provider  divalproex (DEPAKOTE ER) 500 MG 24 hr tablet Take 1,500 mg by mouth  daily. 10/01/17  Yes [provider]  QUEtiapine (SEROQUEL) 200 MG tablet Take 200 mg by mouth at bedtime.   Yes [provider]  gabapentin (NEURONTIN) 300 MG capsule Take 1 capsule (300 mg total) by mouth 3 (three) times daily at 8am, 3pm and bedtime. Patient not taking: Reported on 11/21/2017 07/17/17   Oneta RackLewis, Tanika N, NP  pantoprazole (PROTONIX) 40 MG tablet Take 1 tablet (40 mg total) by mouth 2 (two) times daily before a meal. Patient not taking: Reported on 07/07/2017 01/09/17   Adonis BrookAgustin, Sheila, NP  propranolol (INDERAL) 10 MG tablet Take 1 tablet (10 mg total) by mouth 2 (two) times daily. Patient not taking: Reported on 11/21/2017 07/17/17   Oneta RackLewis, Tanika N, NP  QUEtiapine (SEROQUEL) 300 MG tablet Take 1 tablet (300 mg total) by mouth at bedtime. Patient not taking: Reported on 11/21/2017 07/17/17   Oneta RackLewis, Tanika N, NP  QUEtiapine (SEROQUEL) 50 MG tablet Take 1 tablet (50 mg total) by mouth 2 (two) times daily as needed (severe anxiety/agitation). Patient not taking: Reported on 11/21/2017 07/17/17   Oneta RackLewis, Tanika N, NP    Family History Family History  Problem Relation Age of Onset  . Depression Maternal Aunt     Social History Social History   Tobacco Use  . Smoking status: Former Games developermoker  . Smokeless tobacco: Never Used  . Tobacco comment: Non-smoker  Substance Use Topics  . Alcohol use: No    Comment: 11 months sober in March 2018  . Drug use: No     Allergies  Patient has no known allergies.   Review of Systems Review of Systems  Gastrointestinal: Negative for abdominal pain.  Psychiatric/Behavioral: Positive for hallucinations. Negative for self-injury and suicidal ideas.  All other systems reviewed and are negative.    Physical Exam Updated Vital Signs BP (!) 134/98 (BP Location: Left Arm)   Pulse 97   Resp 18   Ht 5\' 10"  (1.778 m)   Wt 104.3 kg (230 lb)   SpO2 97%   BMI 33.00 kg/m   Physical Exam  Constitutional: He appears  well-developed and well-nourished. No distress.  HENT:  Head: Normocephalic and atraumatic.  Mouth/Throat: No oropharyngeal exudate.  Eyes: Conjunctivae are normal. Pupils are equal, round, and reactive to light.  Neck: Normal range of motion. Neck supple.  Cardiovascular: Normal rate, regular rhythm, normal heart sounds and intact distal pulses.  Pulmonary/Chest: Effort normal.  Musculoskeletal: Normal range of motion.  Neurological: He is alert.  Skin: Skin is warm and dry. Capillary refill takes less than 2 seconds.  Psychiatric: His speech is tangential. Thought content is paranoid.     ED Treatments / Results  Labs (all labs ordered are listed, but only abnormal results are displayed)  Results for orders placed or performed during the hospital encounter of 11/21/17  Comprehensive metabolic panel  Result Value Ref Range   Sodium 138 135 - 145 mmol/L   Potassium 3.7 3.5 - 5.1 mmol/L   Chloride 107 101 - 111 mmol/L   CO2 23 22 - 32 mmol/L   Glucose, Bld 102 (H) 65 - 99 mg/dL   BUN 9 6 - 20 mg/dL   Creatinine, Ser 1.61 0.61 - 1.24 mg/dL   Calcium 9.1 8.9 - 09.6 mg/dL   Total Protein 7.8 6.5 - 8.1 g/dL   Albumin 4.4 3.5 - 5.0 g/dL   AST 19 15 - 41 U/L   ALT 16 (L) 17 - 63 U/L   Alkaline Phosphatase 56 38 - 126 U/L   Total Bilirubin 0.5 0.3 - 1.2 mg/dL   GFR calc non Af Amer >60 >60 mL/min   GFR calc Af Amer >60 >60 mL/min   Anion gap 8 5 - 15  Ethanol  Result Value Ref Range   Alcohol, Ethyl (B) <10 <10 mg/dL  cbc  Result Value Ref Range   WBC 9.0 4.0 - 10.5 K/uL   RBC 4.72 4.22 - 5.81 MIL/uL   Hemoglobin 16.1 13.0 - 17.0 g/dL   HCT 04.5 40.9 - 81.1 %   MCV 94.3 78.0 - 100.0 fL   MCH 34.1 (H) 26.0 - 34.0 pg   MCHC 36.2 (H) 30.0 - 36.0 g/dL   RDW 91.4 78.2 - 95.6 %   Platelets 250 150 - 400 K/uL  Salicylate level  Result Value Ref Range   Salicylate Lvl <7.0 2.8 - 30.0 mg/dL  Acetaminophen level  Result Value Ref Range   Acetaminophen (Tylenol), Serum <10 (L)  10 - 30 ug/mL  Valproic acid level  Result Value Ref Range   Valproic Acid Lvl 45 (L) 50.0 - 100.0 ug/mL   No results found.  Radiology No results found.  Procedures Procedures (including critical care time)  Medications Ordered in ED Medications  ibuprofen (ADVIL,MOTRIN) tablet 600 mg (not administered)  alum & mag hydroxide-simeth (MAALOX/MYLANTA) 200-200-20 MG/5ML suspension 30 mL (not administered)  pantoprazole (PROTONIX) EC tablet 40 mg (not administered)  divalproex (DEPAKOTE ER) 24 hr tablet 1,500 mg (not administered)  QUEtiapine (SEROQUEL) tablet 200 mg (not administered)  Final Clinical Impressions(s) / ED Diagnoses   Final diagnoses:  Paranoid schizophrenia (HCC)    Medically cleared for TTS by me.     Mertha Clyatt, MD 11/21/17 [phone]

## 2017-11-21 NOTE — ED Notes (Signed)
Bed: WA29 Expected date:  Expected time:  Means of arrival:  Comments: Hold triage 2

## 2017-11-21 NOTE — ED Triage Notes (Signed)
Pt reports obsessive thoughts and delusions for the last few days. A&Ox4. Hx schizophrenia.

## 2018-02-16 ENCOUNTER — Emergency Department (HOSPITAL_COMMUNITY)
Admission: EM | Admit: 2018-02-16 | Discharge: 2018-02-17 | Disposition: A | Discharge status: Other Acute Inpatient Hospital | Payer: Medicare Other | Attending: Emergency Medicine | Admitting: Emergency Medicine

## 2018-02-16 ENCOUNTER — Other Ambulatory Visit: Payer: Self-pay

## 2018-02-16 ENCOUNTER — Encounter (HOSPITAL_COMMUNITY): Payer: Self-pay | Admitting: Emergency Medicine

## 2018-02-16 DIAGNOSIS — Z87891 Personal history of nicotine dependence: Secondary | ICD-10-CM | POA: Diagnosis not present

## 2018-02-16 DIAGNOSIS — F329 Major depressive disorder, single episode, unspecified: Secondary | ICD-10-CM | POA: Diagnosis present

## 2018-02-16 DIAGNOSIS — R45851 Suicidal ideations: Secondary | ICD-10-CM

## 2018-02-16 DIAGNOSIS — Z79899 Other long term (current) drug therapy: Secondary | ICD-10-CM | POA: Insufficient documentation

## 2018-02-16 DIAGNOSIS — F209 Schizophrenia, unspecified: Secondary | ICD-10-CM | POA: Insufficient documentation

## 2018-02-16 LAB — RAPID URINE DRUG SCREEN, HOSP PERFORMED
Amphetamines: NOT DETECTED
BARBITURATES: NOT DETECTED
Benzodiazepines: NOT DETECTED
COCAINE: NOT DETECTED
Opiates: NOT DETECTED
TETRAHYDROCANNABINOL: NOT DETECTED

## 2018-02-16 LAB — COMPREHENSIVE METABOLIC PANEL
ALBUMIN: 4.4 g/dL (ref 3.5–5.0)
ALK PHOS: 54 U/L (ref 38–126)
ALT: 26 U/L (ref 17–63)
AST: 33 U/L (ref 15–41)
Anion gap: 13 (ref 5–15)
BILIRUBIN TOTAL: 0.9 mg/dL (ref 0.3–1.2)
BUN: 5 mg/dL — AB (ref 6–20)
CALCIUM: 9.3 mg/dL (ref 8.9–10.3)
CO2: 22 mmol/L (ref 22–32)
CREATININE: 0.77 mg/dL (ref 0.61–1.24)
Chloride: 99 mmol/L — ABNORMAL LOW (ref 101–111)
GFR calc Af Amer: >60 mL/min (ref >60)
GFR calc non Af Amer: >60 mL/min (ref >60)
GLUCOSE: 105 mg/dL — AB (ref 65–99)
Potassium: 3.6 mmol/L (ref 3.5–5.1)
SODIUM: 134 mmol/L — AB (ref 135–145)
TOTAL PROTEIN: 7.9 g/dL (ref 6.5–8.1)

## 2018-02-16 LAB — CBC
HEMATOCRIT: 44.2 % (ref 39.0–52.0)
HEMOGLOBIN: 15.8 g/dL (ref 13.0–17.0)
MCH: 33.9 pg (ref 26.0–34.0)
MCHC: 35.7 g/dL (ref 30.0–36.0)
MCV: 94.8 fL (ref 78.0–100.0)
Platelets: 282 10*3/uL (ref 150–400)
RBC: 4.66 MIL/uL (ref 4.22–5.81)
RDW: 13.1 % (ref 11.5–15.5)
WBC: 10 10*3/uL (ref 4.0–10.5)

## 2018-02-16 LAB — SALICYLATE LEVEL: Salicylate Lvl: <7 mg/dL (ref 2.8–30.0)

## 2018-02-16 LAB — ACETAMINOPHEN LEVEL

## 2018-02-16 LAB — VALPROIC ACID LEVEL: VALPROIC ACID LVL: 63 ug/mL (ref 50.0–100.0)

## 2018-02-16 LAB — ETHANOL: Alcohol, Ethyl (B): <10 mg/dL (ref <10)

## 2018-02-16 MED ORDER — LORAZEPAM 2 MG/ML IJ SOLN: 0.0000 mg | Freq: Four times a day (QID) | INTRAMUSCULAR | Status: DC

## 2018-02-16 MED ORDER — LORAZEPAM 2 MG/ML IJ SOLN: 0.0000 mg | Freq: Two times a day (BID) | INTRAMUSCULAR | Status: DC

## 2018-02-16 MED ORDER — THIAMINE HCL 100 MG/ML IJ SOLN: 100.0000 mg | Freq: Every day | INTRAMUSCULAR | Status: DC

## 2018-02-16 MED ORDER — QUETIAPINE FUMARATE 200 MG PO TABS
200.0000 mg | ORAL_TABLET | Freq: Every day | ORAL | Status: DC
  Administered 2018-02-16: 200 mg via ORAL
  Filled 2018-02-16: qty 1

## 2018-02-16 MED ORDER — LORAZEPAM 1 MG PO TABS: 0.0000 mg | ORAL_TABLET | Freq: Two times a day (BID) | ORAL | Status: DC

## 2018-02-16 MED ORDER — LORAZEPAM 1 MG PO TABS
0.0000 mg | ORAL_TABLET | Freq: Four times a day (QID) | ORAL | Status: DC
  Administered 2018-02-16 – 2018-02-17 (×2): 1 mg via ORAL
  Filled 2018-02-16 (×2): qty 1

## 2018-02-16 MED ORDER — VITAMIN B-1 100 MG PO TABS
100.0000 mg | ORAL_TABLET | Freq: Every day | ORAL | Status: DC
  Administered 2018-02-17: 100 mg via ORAL
  Filled 2018-02-16 (×2): qty 1

## 2018-02-16 MED ORDER — DIVALPROEX SODIUM ER 500 MG PO TB24
1500.0000 mg | ORAL_TABLET | Freq: Every day | ORAL | Status: DC
  Administered 2018-02-16: 1500 mg via ORAL
  Filled 2018-02-16: qty 3

## 2018-02-16 NOTE — BH Assessment (Addendum)
Tele Assessment Note   Patient Name: Albert [name] MRN: [id number] Referring Physician: Arlean Hopping, PA Location of Patient: [facility] Location of Provider: [place] Department  Albert [name] is an 42 y.o. male, who presents voluntary and accompanied by a sitter to [facility]. During the assessment pt presented confused as he would go back and forth with his responses. Clinician asked the pt, "what brought you to the hospital?" Pt reported, "psychotic thoughts, intrusive thoughts, don't make sense, rude, crazy." Pt reported, "this guy in my head tells me stuff, things like I'm a bad person." Pt reported, "makes me feel bad, like I did something wrong, like I'm a criminal." Pt reported, the voice  sometimes is his own and also someone else's.  Pt initially reported, no visual hallucinations. Clinician noted the pt changing his response. Clinician asked specific questions to better understand the pt's response. Pt reported, he "sometimes" sees people that others cannot see. Pt reported, he attempted suicide in 1999 by putting a gun to his head but he did not pull the trigger. Pt reported, cutting himself in 1999 not currently. Pt denies, SI, HI, current self-injurious behaviors and access to weapons.   Pt denies abuse and substance use. Pt reported, was prescribed Seroquel, Risperdal and Depakote by Dr. Reece Levy. Pt reported, a couple of weeks ago he met with Dr. Reece Levy to get off his medications because he felt they were not working. Pt then asked clinician if he could get back on his medications because he was not sure if they were working. Pt reported, seeing [facility] for a counseling session a week ago. Pt reported, previous inpatient admissions.   Pt presents alert in scrubs with logical/coherent speech. Pt's eye contact was poor. During the assessment continued to look off in the direction of his sitter. Pt's mood was preoccupied/helpless. Pt's affect was preoccupied. Pt's thought process  was coherent/relevant. Pt's judgement was partial. Pt's concentration was normal. Pt's insight was poor. Pt's impulse control was fair. Pt reported, if discharged from [facility] he could not contract for safety. Pt reported, if inpatient treatment was recommended he would sign-in voluntarily. Clinician explained the three possible recommendations in detail (discharge, overnight observation for safety and stabilization or inpatient treatment.)   Diagnosis: F20.9 Schizophrenia.  Past Medical History:  Past Medical History:  Diagnosis Date  . Alcoholism ([place])   . Drug abuse ([place])    xanax addiction 5 years ago  . Medical non-compliance   . Paranoid schizophrenia ([place])   . Seizures ([place])     History reviewed. No pertinent surgical history.  Family History:  Family History  Problem Relation Age of Onset  . Depression Maternal Aunt     Social History:  reports that he has quit smoking. He has never used smokeless tobacco. He reports that he drank alcohol. He reports that he has current or past drug history. Drug: Marijuana.  Additional Social History:  Alcohol / Drug Use Pain Medications: See MAR Prescriptions: See MAR Over the Counter: See MAR History of alcohol / drug use?: No history of alcohol / drug abuse  CIWA: CIWA-Ar BP: 129/82 Pulse Rate: 80 COWS:    Allergies: No Known Allergies  Home Medications:  (Not in a hospital admission)  OB/GYN Status:  No LMP for male patient.  General Assessment Data Location of Assessment: [facility] ED TTS Assessment: In system Is this a Tele or Face-to-Face Assessment?: Tele Assessment Is this an Initial Assessment or a Re-assessment for this encounter?: Initial Assessment Marital status: Single  Living Arrangements: Alone Can pt return to current living arrangement?: Yes Admission Status: Voluntary Is patient capable of signing voluntary admission?: Yes Referral Source: Self/Family/Friend Insurance type: Medicare.      Crisis Care  Plan Living Arrangements: Alone Legal Guardian: Other:(Self. ) Name of Psychiatrist: Dr. Reece Levy Name of Therapist: [facility]  Education Status Is patient currently in school?: No Is the patient employed, unemployed or receiving disability?: Receiving disability income  Risk to self with the past 6 months Suicidal Ideation: No-Not Currently/Within Last 6 Months(Pt denies. ) Has patient been a risk to self within the past 6 months prior to admission? : No Suicidal Intent: No Has patient had any suicidal intent within the past 6 months prior to admission? : No Is patient at risk for suicide?: No Suicidal Plan?: No(Pt denies.) Has patient had any suicidal plan within the past 6 months prior to admission? : No Access to Means: No What has been your use of drugs/alcohol within the last 12 months?: Pt's UDS is negative.  Previous Attempts/Gestures: Yes How many times?: 1 Other Self Harm Risks: Cutting.  Triggers for Past Attempts: Unknown Intentional Self Injurious Behavior: Cutting Comment - Self Injurious Behavior: Pt has a history of cutting nothing current.  Family Suicide History: No Recent stressful life event(s): Other (Comment)(Hearing voices. ) Persecutory voices/beliefs?: Yes Depression: Yes Depression Symptoms: Feeling worthless/self pity, Loss of interest in usual pleasures, Guilt Substance abuse history and/or treatment for substance abuse?: No Suicide prevention information given to non-admitted patients: Not applicable  Risk to Others within the past 6 months Homicidal Ideation: No(Pt denies. ) Does patient have any lifetime risk of violence toward others beyond the six months prior to admission? : No(Pt denies. ) Thoughts of Harm to Others: No(Pt denies. ) Current Homicidal Intent: No Current Homicidal Plan: No Access to Homicidal Means: No Identified Victim: NA History of harm to others?: No(Pt denies. ) Assessment of Violence: None Noted Violent Behavior  Description: NA Does patient have access to weapons?: No(Pt denies. ) Criminal Charges Pending?: No Does patient have a court date: No Is patient on probation?: No  Psychosis Hallucinations: Auditory, Visual Delusions: Unspecified  Mental Status Report Appearance/Hygiene: In scrubs Eye Contact: Poor Motor Activity: Unremarkable Speech: Logical/coherent Level of Consciousness: Alert Mood: Preoccupied, Helpless Affect: Preoccupied Anxiety Level: Minimal Thought Processes: Coherent, Relevant Judgement: Partial Orientation: Person, Place, Time, Situation Obsessive Compulsive Thoughts/Behaviors: None  Cognitive Functioning Concentration: Normal Memory: Recent Intact Is patient IDD: No Is patient DD?: No Insight: Poor Impulse Control: Fair Appetite: Fair Sleep: No Change Total Hours of Sleep: 8 Vegetative Symptoms: Staying in bed  ADLScreening [facility] Assessment Services) Patient's cognitive ability adequate to safely complete daily activities?: Yes Patient able to express need for assistance with ADLs?: Yes Independently performs ADLs?: Yes (appropriate for developmental age)  Prior Inpatient Therapy Prior Inpatient Therapy: Yes Prior Therapy Dates: Pt reported, five months ago.  Prior Therapy Facilty/Provider(s): [facility].   Prior Outpatient Therapy Prior Outpatient Therapy: Yes Prior Therapy Dates: Current. Prior Therapy Facilty/Provider(s): [facility] Reason for Treatment: Counseling.  Does patient have an ACCT team?: No Does patient have Intensive In-House Services?  : No Does patient have [facility] services? : No Does patient have P4CC services?: No  ADL Screening (condition at time of admission) Patient's cognitive ability adequate to safely complete daily activities?: Yes Is the patient deaf or have difficulty hearing?: No Does the patient have difficulty seeing, even when wearing glasses/contacts?: No Does the patient have difficulty concentrating,  remembering,  or making decisions?: Yes Patient able to express need for assistance with ADLs?: Yes Does the patient have difficulty dressing or bathing?: No Independently performs ADLs?: Yes (appropriate for developmental age) Does the patient have difficulty walking or climbing stairs?: No Weakness of Legs: None Weakness of Arms/Hands: None  Home Assistive Devices/Equipment Home Assistive Devices/Equipment: None    Abuse/Neglect Assessment (Assessment to be complete while patient is alone) Abuse/Neglect Assessment Can Be Completed: Yes Physical Abuse: Denies(Pt denies. ) Verbal Abuse: Denies(Pt denies. ) Sexual Abuse: Denies(Pt denies. ) Exploitation of patient/patient's resources: Denies(Pt denies. ) Self-Neglect: Denies(Pt denies. )     Advance Directives (For Healthcare) Does Patient Have a Medical Advance Directive?: No    Additional Information 1:1 In Past 12 Months?: No CIRT Risk: No Elopement Risk: No Does patient have medical clearance?: Yes     Disposition: Lindon Romp, NP recommends inpatient treatment. Disposition discussed with Arlean Hopping, PA and Freida Busman, RN. TTS to seek placement.  Disposition Initial Assessment Completed for this Encounter: Yes Disposition of Patient: (inpatient treatment.) Patient refused recommended treatment: No Mode of transportation if patient is discharged?: N/A  This service was provided via telemedicine using a 2-way, interactive audio and video technology.  Names of all persons participating in this telemedicine service and their role in this encounter. Name: [name] Role: Sitter             Vertell Novak 02/16/2018 8:16 PM   Vertell Novak, MS, [facility], [facility] Triage Specialist [phone]

## 2018-02-16 NOTE — BHH Counselor (Signed)
Clinician faxed pt's referral to the following inpatient treatment facilities:   [facility]. [facility]. [facility]. [facility]. [facility]. [facility].  TTS will continue to follow up.   Redmond Pullingreylese D Paulmichael Schreck, MS, [facility], [facility] Triage Specialist [phone]

## 2018-02-16 NOTE — ED Notes (Signed)
[facility]undy [facility]. Male relative/friend w/pt - Took ALL of pt's belongings home.

## 2018-02-16 NOTE — ED Provider Notes (Signed)
[facility] Provider Note   CSN: [id number] Arrival date & time: 02/16/18  1421     History   Chief Complaint Chief Complaint  Patient presents with  . Suicidal    HPI Albert [name] is a 42 y.o. male.  HPI   Albert [name] is a 42 y.o. male, with a history of alcohol abuse, drug abuse, schizophrenia, and seizures, presenting to the ED with "feeling weird."  States, "I just feel weird and out of my head. I can't get a hold of my thoughts and I can't organize them." Denies HI. Endorses intermittent SI without a current plan stating, "sometimes it just comes into my head." States he has not been compliant with his medications.  States, "I try not to take them because I am suspicious of them." When asked about drug use, patient states, "I do not think so, except for people.  People can be a drug, can't they?"   Patient's mother is present at the bedside.  States patient began calling her repeatedly today.  He was disoriented on the phone and saying things that didn't make sense.  Has been responding to auditory and visual hallucinations.  States this behavior is consistent with his past psychotic breaks.  Patient denies physical complaints at this time.    Past Medical History:  Diagnosis Date  . Alcoholism ([facility])   . Drug abuse ([facility])    xanax addiction 5 years ago  . Medical non-compliance   . Paranoid schizophrenia ([facility])   . Seizures [facility](HCC)     Patient Active Problem List   Diagnosis Date Noted  . Alcohol use disorder, moderate, in sustained remission (HCC) 07/09/2017  . Cannabis use disorder, moderate, in sustained remission (HCC) 07/09/2017  . Elevated TSH 01/05/2017  . Tachycardia 01/05/2017  . Schizoaffective disorder, bipolar type ([facility]CC) 01/04/2017  . Alcoholism ([facility])   . Drug abuse ([facility])   . Medical non-compliance     History reviewed. No pertinent surgical history.      Home Medications    Prior to  Admission medications   Medication Sig Start Date End Date Taking? Authorizing Provider  divalproex (DEPAKOTE ER) 500 MG 24 hr tablet Take 1,500 mg by mouth at bedtime.  10/01/17  Yes [provider]  omeprazole (PRILOSEC) 40 MG capsule Take 40 mg by mouth daily. 01/25/18  Yes [provider]  QUEtiapine (SEROQUEL) 200 MG tablet Take 200 mg by mouth at bedtime.   Yes [provider]  gabapentin (NEURONTIN) 300 MG capsule Take 1 capsule (300 mg total) by mouth 3 (three) times daily at 8am, 3pm and bedtime. Patient not taking: Reported on 11/21/2017 07/17/17   Oneta RackLewis, Tanika N, NP  pantoprazole (PROTONIX) 40 MG tablet Take 1 tablet (40 mg total) by mouth 2 (two) times daily before a meal. Patient not taking: Reported on 07/07/2017 01/09/17   Adonis BrookAgustin, Sheila, NP  propranolol (INDERAL) 10 MG tablet Take 1 tablet (10 mg total) by mouth 2 (two) times daily. Patient not taking: Reported on 11/21/2017 07/17/17   Oneta RackLewis, Tanika N, NP  QUEtiapine (SEROQUEL) 300 MG tablet Take 1 tablet (300 mg total) by mouth at bedtime. Patient not taking: Reported on 11/21/2017 07/17/17   Oneta RackLewis, Tanika N, NP  QUEtiapine (SEROQUEL) 50 MG tablet Take 1 tablet (50 mg total) by mouth 2 (two) times daily as needed (severe anxiety/agitation). Patient not taking: Reported on 11/21/2017 07/17/17   Oneta RackLewis, Tanika N, NP    Family History Family History  Problem Relation Age of Onset  . Depression Maternal Aunt     Social History Social History   Tobacco Use  . Smoking status: Former Games developer  . Smokeless tobacco: Never Used  . Tobacco comment: Non-smoker  Substance Use Topics  . Alcohol use: Not Currently  . Drug use: Yes    Types: Marijuana    Comment: xanax- several years ago     Allergies   Patient has no known allergies.   Review of Systems Review of Systems  Unable to perform ROS: Psychiatric disorder     Physical Exam Updated Vital Signs BP 135/83 (BP Location: Left Arm)   Pulse 76    Temp 97.6 F (36.4 C) (Oral)   Resp 17   Ht 5\' 10"  (1.778 m)   Wt 102.1 kg (225 lb)   SpO2 99%   BMI 32.28 kg/m   Physical Exam  Constitutional: He is oriented to person, place, and time. He appears well-developed and well-nourished. No distress.  HENT:  Head: Normocephalic and atraumatic.  Eyes: Conjunctivae are normal.  Neck: Neck supple.  Cardiovascular: Normal rate, regular rhythm, normal heart sounds and intact distal pulses.  Pulmonary/Chest: Effort normal and breath sounds normal. No respiratory distress.  Abdominal: Soft. There is no tenderness. There is no guarding.  Musculoskeletal: He exhibits no edema.  Lymphadenopathy:    He has no cervical adenopathy.  Neurological: He is alert and oriented to person, place, and time.  No sensory deficits. Strength 5/5 in all extremities. No gait disturbance. Cranial nerves III-XII grossly intact. No facial droop.   Skin: Skin is warm and dry. He is not diaphoretic.  Psychiatric: He has a normal mood and affect. His behavior is normal.  Nursing note and vitals reviewed.    ED Treatments / Results  Labs (all labs ordered are listed, but only abnormal results are displayed) Labs Reviewed  COMPREHENSIVE METABOLIC PANEL - Abnormal; Notable for the following components:      Result Value   Sodium 134 (*)    Chloride 99 (*)    Glucose, Bld 105 (*)    BUN 5 (*)    All other components within normal limits  ACETAMINOPHEN LEVEL - Abnormal; Notable for the following components:   Acetaminophen (Tylenol), Serum <10 (*)    All other components within normal limits  ETHANOL  SALICYLATE LEVEL  CBC  RAPID URINE DRUG SCREEN, HOSP PERFORMED  VALPROIC ACID LEVEL    EKG None  Radiology No results found.  Procedures Procedures (including critical care time)  Medications Ordered in ED Medications  LORazepam (ATIVAN) injection 0-4 mg (has no administration in time range)    Or  LORazepam (ATIVAN) tablet 0-4 mg (has no  administration in time range)  LORazepam (ATIVAN) injection 0-4 mg (has no administration in time range)    Or  LORazepam (ATIVAN) tablet 0-4 mg (has no administration in time range)  thiamine (VITAMIN B-1) tablet 100 mg (has no administration in time range)    Or  thiamine (B-1) injection 100 mg (has no administration in time range)  divalproex (DEPAKOTE ER) 24 hr tablet 1,500 mg (has no administration in time range)  QUEtiapine (SEROQUEL) tablet 200 mg (has no administration in time range)     Initial Impression / Assessment and Plan / ED Course  I have reviewed the triage vital signs and the nursing notes.  Pertinent labs & imaging results that were available during my care of the patient were reviewed by me and considered in  my medical decision making (see chart for details).  Clinical Course as of Feb 16 2026  Sun Feb 16, 2018  2016 [facility] counselor, Thurston Pounds, states patient has been recommended for inpatient therapy.   [SJ]    Clinical Course User Index [SJ] Kasarah Sitts C, PA-C     Patient presents with what he describes as "feeling weird."  He does not answer any of my questions directly.  He is calm, but his answers are disordered.  I think this patient would be a danger to himself and should not be discharged at this time. Patient is medically cleared.  Following reconciliation by pharmacy tech, patient's home medications were ordered.  Findings and plan of care discussed with Theda Belfast, MD.   Final Clinical Impressions(s) / ED Diagnoses   Final diagnoses:  Suicidal thoughts    ED Discharge Orders    None       Concepcion Living 02/16/18 2028    Tegeler, Canary Brim, MD 02/17/18 [phone]

## 2018-02-16 NOTE — B[facility] Counselor (Signed)
[facility] [facility] 1000 on 02/17/2018. Accepting physician: Dr. Elby Showersaj. Nursing report: [phone]. Updated disposition discussed with Drue Flirtandy, RN.   Redmond Pullingreylese D Yu Cragun, MS, [facility], [facility] Triage Specialist [phone]

## 2018-02-16 NOTE — ED Notes (Signed)
Snack and drink given. Pt still standing in doorway. Ask pt to return to room. Pt states that the voices are "intrusive" and he doesn't want to tell me what they are saying. Sitter at bedside. Will continue to monitor.

## 2018-02-16 NOTE — ED Notes (Signed)
PT doesn't want any blood work done at the moment//

## 2018-02-16 NOTE — ED Notes (Signed)
Pt belongings placed in locker 6.   

## 2018-02-16 NOTE — ED Notes (Signed)
TTS being performed.  

## 2018-02-16 NOTE — ED Notes (Signed)
Security notified to wand pt 

## 2018-02-16 NOTE — ED Notes (Signed)
Pt standing in doorway of room, staring at everyone, rocking back and forth. When asked pt to return to his room, he states that the voices in his head are talking. Would not tell me what they are saying.

## 2018-02-16 NOTE — ED Notes (Signed)
Pt beginning to become sleepy. Ask pt to get into bed and get comfortable and watch tv and that would help him feel better. Lights turned off. Sitter at bedside.

## 2018-02-16 NOTE — ED Notes (Signed)
Lab advised will add Valproic Acid to dark green tube that ETOH was drawn in.

## 2018-02-16 NOTE — ED Triage Notes (Signed)
C/o being paranoid.  States he is suicidal without a plan.  Reports visual hallucinations.

## 2018-02-17 DIAGNOSIS — F209 Schizophrenia, unspecified: Secondary | ICD-10-CM | POA: Diagnosis not present

## 2018-02-17 NOTE — ED Notes (Signed)
[facility] Can come tomorrow,4/22, anytime after 1000. Accepting md is Dr. Elby Showersaj. Report #is [id number]. [facility]

## 2018-02-17 NOTE — ED Notes (Signed)
Breakfast tray ordered 

## 2018-02-17 NOTE — ED Notes (Signed)
Pt awake and pleasant at this time. States that he slept well.

## 2018-02-17 NOTE — ED Notes (Addendum)
Pt sleeping soundly. NAD. Resp even and non-labored. 

## 2018-06-04 ENCOUNTER — Other Ambulatory Visit: Payer: Self-pay

## 2018-06-04 ENCOUNTER — Emergency Department (HOSPITAL_COMMUNITY)
Admission: EM | Admit: 2018-06-04 | Discharge: 2018-06-05 | Disposition: A | Discharge status: Home / Self Care | Payer: Medicare Other | Attending: Emergency Medicine | Admitting: Emergency Medicine

## 2018-06-04 ENCOUNTER — Encounter (HOSPITAL_COMMUNITY): Payer: Self-pay

## 2018-06-04 DIAGNOSIS — R45850 Homicidal ideations: Secondary | ICD-10-CM | POA: Diagnosis not present

## 2018-06-04 DIAGNOSIS — F200 Paranoid schizophrenia: Secondary | ICD-10-CM | POA: Diagnosis not present

## 2018-06-04 DIAGNOSIS — Z046 Encounter for general psychiatric examination, requested by authority: Secondary | ICD-10-CM

## 2018-06-04 DIAGNOSIS — Z79899 Other long term (current) drug therapy: Secondary | ICD-10-CM | POA: Insufficient documentation

## 2018-06-04 DIAGNOSIS — F209 Schizophrenia, unspecified: Secondary | ICD-10-CM

## 2018-06-04 HISTORY — DX: Schizophrenia, unspecified: F20.9

## 2018-06-04 LAB — COMPREHENSIVE METABOLIC PANEL
ALT: 12 U/L (ref 0–44)
AST: 18 U/L (ref 15–41)
Albumin: 4.4 g/dL (ref 3.5–5.0)
Alkaline Phosphatase: 50 U/L (ref 38–126)
Anion gap: 11 (ref 5–15)
BUN: 12 mg/dL (ref 6–20)
CHLORIDE: 108 mmol/L (ref 98–111)
CO2: 24 mmol/L (ref 22–32)
CREATININE: 0.75 mg/dL (ref 0.61–1.24)
Calcium: 9.6 mg/dL (ref 8.9–10.3)
GFR calc non Af Amer: >60 mL/min (ref >60)
Glucose, Bld: 94 mg/dL (ref 70–99)
Potassium: 4.4 mmol/L (ref 3.5–5.1)
Sodium: 143 mmol/L (ref 135–145)
Total Bilirubin: 0.7 mg/dL (ref 0.3–1.2)
Total Protein: 7.6 g/dL (ref 6.5–8.1)

## 2018-06-04 LAB — RAPID URINE DRUG SCREEN, HOSP PERFORMED
Amphetamines: NOT DETECTED
BARBITURATES: NOT DETECTED
Benzodiazepines: NOT DETECTED
Cocaine: NOT DETECTED
Opiates: NOT DETECTED
Tetrahydrocannabinol: NOT DETECTED

## 2018-06-04 LAB — CBC
HCT: 46.3 % (ref 39.0–52.0)
Hemoglobin: 16 g/dL (ref 13.0–17.0)
MCH: 34 pg (ref 26.0–34.0)
MCHC: 34.6 g/dL (ref 30.0–36.0)
MCV: 98.5 fL (ref 78.0–100.0)
Platelets: 252 10*3/uL (ref 150–400)
RBC: 4.7 MIL/uL (ref 4.22–5.81)
RDW: 14 % (ref 11.5–15.5)
WBC: 7.3 10*3/uL (ref 4.0–10.5)

## 2018-06-04 LAB — SALICYLATE LEVEL: Salicylate Lvl: <7 mg/dL (ref 2.8–30.0)

## 2018-06-04 LAB — ACETAMINOPHEN LEVEL: Acetaminophen (Tylenol), Serum: <10 ug/mL — ABNORMAL LOW (ref 10–30)

## 2018-06-04 LAB — ETHANOL

## 2018-06-04 MED ORDER — QUETIAPINE FUMARATE 100 MG PO TABS
100.0000 mg | ORAL_TABLET | Freq: Every day | ORAL | Status: DC
  Administered 2018-06-04: 100 mg via ORAL
  Filled 2018-06-04: qty 1

## 2018-06-04 MED ORDER — LORAZEPAM 2 MG/ML IJ SOLN
2.0000 mg | INTRAMUSCULAR | Status: DC | PRN
  Administered 2018-06-04: 2 mg via INTRAMUSCULAR
  Filled 2018-06-04: qty 1

## 2018-06-04 MED ORDER — HYDROXYZINE HCL 25 MG PO TABS: 50.0000 mg | ORAL_TABLET | Freq: Four times a day (QID) | ORAL | Status: DC | PRN

## 2018-06-04 MED ORDER — HALOPERIDOL 5 MG PO TABS
5.0000 mg | ORAL_TABLET | Freq: Four times a day (QID) | ORAL | Status: DC | PRN
  Filled 2018-06-04: qty 1

## 2018-06-04 MED ORDER — DIPHENHYDRAMINE HCL 50 MG/ML IJ SOLN
50.0000 mg | Freq: Four times a day (QID) | INTRAMUSCULAR | Status: DC | PRN
  Filled 2018-06-04: qty 1

## 2018-06-04 MED ORDER — HALOPERIDOL LACTATE 5 MG/ML IJ SOLN
5.0000 mg | Freq: Four times a day (QID) | INTRAMUSCULAR | Status: DC | PRN
  Administered 2018-06-04: 5 mg via INTRAMUSCULAR
  Filled 2018-06-04: qty 1

## 2018-06-04 NOTE — ED Notes (Signed)
Patient wanded by security. 

## 2018-06-04 NOTE — ED Provider Notes (Signed)
[facility]-EMERGENCY DEPT Provider Note  CSN: [id number] Arrival date & time: 06/04/18  1826   History   Chief Complaint Chief Complaint  Patient presents with  . IVC  . Homicidal    HPI Albert SanesChristopher [name] is a 42 y.o. male with a psychiatric history of schizophrenia and no significant medical history who presented to the ED under IVC. Patient has no physical complaints today, but admits to feeling more paranoid and "disrupted" lately. He discusses feeling like people in his AA group are out to get him and want him out of the group. He denies AVH, but mentions hearing a voice who is telling him negative things about himself like calling him stupid and ugly. Denies SI and HI currently.   Past Medical History:  Diagnosis Date  . Schizophrenia (HCC)     There are no active problems to display for this patient.   History reviewed. No pertinent surgical history.      Home Medications    Prior to Admission medications   Medication Sig Start Date End Date Taking? Authorizing Provider  divalproex (DEPAKOTE) 500 MG DR tablet Take 1,500 mg by mouth at bedtime.   Yes [provider]  QUEtiapine (SEROQUEL) 100 MG tablet Take 100 mg by mouth at bedtime.   Yes [provider]    Family History Family History  Problem Relation Age of Onset  . Thyroid disease Mother     Social History Social History   Tobacco Use  . Smoking status: Never Smoker  . Smokeless tobacco: Never Used  Substance Use Topics  . Alcohol use: Not Currently  . Drug use: Not Currently     Allergies   Patient has no known allergies.   Review of Systems Review of Systems  Constitutional: Negative.   HENT: Negative.   Eyes: Negative.   Respiratory: Negative.   Cardiovascular: Negative.   Gastrointestinal: Negative.   Genitourinary: Negative.   Musculoskeletal: Negative.   Neurological: Negative.   Psychiatric/Behavioral: Positive for agitation, confusion  and hallucinations. The patient is nervous/anxious.      Physical Exam Updated Vital Signs BP (!) 152/98 (BP Location: Left Arm)   Pulse 95   Temp 98.1 F (36.7 C) (Oral)   Resp 18   Ht 5\' 10"  (1.778 m)   Wt 74.4 kg (164 lb)   SpO2 93%   BMI 23.53 kg/m   Physical Exam  Constitutional: He appears well-developed and well-nourished.  Eyes: Pupils are equal, round, and reactive to light. Conjunctivae, EOM and lids are normal.  Cardiovascular: Normal rate and regular rhythm.  No murmur heard. Pulmonary/Chest: Effort normal and breath sounds normal.  Musculoskeletal: Normal range of motion.  Neurological: He is alert. He has normal strength. No cranial nerve deficit or sensory deficit. He exhibits normal muscle tone.  Reflex Scores:      Tricep reflexes are 2+ on the right side and 2+ on the left side.      Bicep reflexes are 2+ on the right side and 2+ on the left side.      Brachioradialis reflexes are 2+ on the right side and 2+ on the left side.      Patellar reflexes are 2+ on the right side and 2+ on the left side.      Achilles reflexes are 2+ on the right side and 2+ on the left side. Skin: Skin is warm and intact. Capillary refill takes less than 2 seconds. No abrasion and no bruising noted.  Psychiatric:  His mood appears anxious. He is actively hallucinating. Thought content is paranoid. He expresses no homicidal and no suicidal ideation. He expresses no suicidal plans and no homicidal plans.  Patient's thought process is circumstantial. His speech is hyperverbal, but not pressured and can be interrupted. He is internally stimulated and frequently looks distracted and exhibits thought blocking. He denies SI, HI and AVH. He is inattentive.  Nursing note and vitals reviewed.    ED Treatments / Results  Labs (all labs ordered are listed, but only abnormal results are displayed) Labs Reviewed  ACETAMINOPHEN LEVEL - Abnormal; Notable for the following components:      Result  Value   Acetaminophen (Tylenol), Serum <10 (*)    All other components within normal limits  COMPREHENSIVE METABOLIC PANEL  ETHANOL  SALICYLATE LEVEL  CBC  RAPID URINE DRUG SCREEN, HOSP PERFORMED    EKG None  Radiology No results found.  Procedures Procedures (including critical care time)  Medications Ordered in ED Medications  haloperidol lactate (HALDOL) injection 5 mg (5 mg Intramuscular Given 06/04/18 2217)  haloperidol (HALDOL) tablet 5 mg (has no administration in time range)  LORazepam (ATIVAN) injection 2 mg (2 mg Intramuscular Given 06/04/18 2216)  diphenhydrAMINE (BENADRYL) injection 50 mg (has no administration in time range)  hydrOXYzine (ATARAX/VISTARIL) tablet 50 mg (has no administration in time range)  QUEtiapine (SEROQUEL) tablet 100 mg (100 mg Oral Given 06/04/18 2144)     Initial Impression / Assessment and Plan / ED Course  Triage vital signs and the nursing notes have been reviewed.  Pertinent labs & imaging results that were available during care of the patient were reviewed and considered in medical decision making (see chart for details).  Patient presents under IVC for psychosis. Per IVC, patient endorses AVH that have homicidal undertones. He denies SI, HI and AVH on interview today; however, patient's behaviors and speech is consistent with psychosis. He admits to paranoid ideations about people in his AA group being out to get him. He frequently talks about "a voice" telling him negative things about himself. Patient is open to discussing his psychiatric needs and possibly receiving inpatient psychiatric treatment if necessary. He would likely benefit from inpatient treatment as he has been without his psychiatric medications and continues to de-stabilize mentally.  Patient has no physical complaints today and his physical exam is unremarkable. Labs are unremarkable. From a medical perspective, patient is cleared. Clinical Course as of Jun 05 2255  Wed  Jun 04, 2018  2008 TTS consulted for further evaluation.   [GM]  2024 Labs unremarkable. Negative UDS. Cleared from a medical perspective.   [GM]    Clinical Course User Index [GM] Mortis, Sharyon Medicus, PA-C    Final Clinical Impressions(s) / ED Diagnoses  1. Schizophrenia. TTS consulted. Ordered pt's previous home medications of Seroquel 100mg  QHS. PRN medications ordered for acute agitation or psychosis (Benadryl, Ativan, Haldol).   Final diagnoses:  Schizophrenia, unspecified type [facility])  Involuntary commitment    ED Discharge Orders    None        Windy Carina, [place] 06/04/18 2256    Little, Ambrose Finland, MD 06/05/18 [phone]

## 2018-06-04 NOTE — ED Notes (Addendum)
Pt is very paranoid, and confused. He states " I see people on the street and think they are unspiritual and that they need to die".  His demeanor is calm in the mileu

## 2018-06-04 NOTE — ED Notes (Addendum)
Pt. Belongings searched and inventory and placed in Locker @43 .

## 2018-06-04 NOTE — ED Notes (Signed)
AC notified that the patient is homicidal and and has IVc papers. Notified that the patient will need a sitter., but no order for sitter who is homicidal and Has IVc papers on the protocol orders.

## 2018-06-04 NOTE — ED Notes (Signed)
Bed: [facility] Expected date:  Expected time:  Means of arrival:  Comments: Hall C 

## 2018-06-04 NOTE — ED Notes (Addendum)
Pt medicated. He is very confused, anxious. He had a sandwich that he didn't eat and I said " you havent eaten much" and he replied " what do you mean I dont meet much?"  I tried to tell him I said " eaten" and he argued I said " meet" He appears very confused.  He asked " how do I eat?" " what do you mean I should eat?"    I handed him his food and he took a bite but remained confused.

## 2018-06-04 NOTE — ED Triage Notes (Addendum)
Patient was brought in by [employer] with hand cuffs on. [employer] removed the hand cuffs off when the patient arrived handcuffs removed by [employer]. [employer] brought IVC papers with them. IVC states-Patient  Diagnosed with schizophrenia. Patient is not taking Seroquel as prescribed. Patient states the voices tell him to journal and also to kill people (voice is a man he used to know as a child). Respondent has been committed before. Patient thinks that AA group members are plotting against him. Patient has become fixated on a man in the AA group.  When the patient asked if he was suicidal the patient sted, "Not really" and then stated, "No"

## 2018-06-05 ENCOUNTER — Encounter (HOSPITAL_COMMUNITY): Payer: Self-pay | Admitting: Emergency Medicine

## 2018-06-05 DIAGNOSIS — F200 Paranoid schizophrenia: Secondary | ICD-10-CM | POA: Diagnosis present

## 2018-06-05 NOTE — Discharge Instructions (Signed)
For your behavioral health needs, you are advised to follow up with your outpatient providers, Ellamae SiaKeshavpal Reddy, MD and Daiva Evesobert Milan, LCSW:       Ellamae SiaKeshavpal Reddy, MD      [facility]      [address], [address]      [place], [place] [address]      [phone]       Robert Milan, LCSW      [address]      [place], [place] [address]      [phone]

## 2018-06-05 NOTE — ED Notes (Signed)
Pt reports feeling better. He said, "I think the Haldol might have helped me because I do not feel cocky anymore."  He was pleasant and cooperative.   Pt discharged home. Discharged instructions read to pt who verbalized understanding. All belongings returned to pt who signed for same. Denies SI/HI, is not delusional and not responding to internal stimuli. Escorted pt to the ED exit.

## 2018-06-05 NOTE — BH Assessment (Signed)
[facility]  Per Juanetta BeetsJacqueline Norman, DO, this pt does not require psychiatric hospitalization at this time.  Pt presents under IVC initiated by pt's AA sponsor, which Dr Sharma CovertNorman has rescinded.  [facility] with recommendation to continue treatment with Ellamae SiaKeshavpal Reddy, MD, and with Daiva Evesobert Milan, LCSW, his regular outpatient providers.  This has been included in pt's discharge instructions.  Pt's nurse, Diane, has been notified.  Doylene Canninghomas Gay Rape, MA Triage Specialist [phone]

## 2018-06-05 NOTE — BHH Suicide Risk Assessment (Signed)
Suicide Risk Assessment  Discharge Assessment   [facility]   Principal Problem: Paranoid schizophrenia [facility](HCC) Discharge Diagnoses: There are no active problems to display for this patient.  Pt was seen and chart reviewed with treatment team and Dr Sharma CovertNorman. Pt denies suicidal/homicidal ideation, denies auditory/visual hallucinations and does not appear to be responding to internal stimuli. Pt stated he was having coffee, listening to music at a coffee shop when the next thing he knew the police pulled up and brought him to the ED. Pt's UDS and BAL were negative. Pt made a statement that "noone drives him crazy like his mother does", in reference to a song he was listening to. Pt is seen by Dr Betti Cruzeddy for psychiatry and Loura PardonBob Miller for therapy and Pt stated he takes his medications as prescribed. Pt is psychiatrically clear for dis charge.   Total Time spent with patient: 30 minutes  Musculoskeletal: Strength & Muscle Tone: within normal limits Gait & Station: normal Patient leans: N/A  Psychiatric Specialty Exam:   Blood pressure 116/79, pulse 80, temperature 97.7 F (36.5 C), temperature source Oral, resp. rate 18, height 5\' 10"  (1.778 m), weight 74.4 kg, SpO2 98 %.Body mass index is 23.53 kg/m.  General Appearance: Casual  Eye Contact::  Good  Speech:  Clear and Coherent409  Volume:  Normal  Mood:  Depressed  Affect:  Congruent  Thought Process:  Coherent, Linear and Descriptions of Associations: Intact  Orientation:  Full (Time, Place, and Person)  Thought Content:  Logical  Suicidal Thoughts:  No  Homicidal Thoughts:  No  Memory:  Immediate;   Good Recent;   Good Remote;   Fair  Judgement:  Intact  Insight:  Lacking  Psychomotor Activity:  Normal  Concentration:  Good  Recall:  Good  Fund of Knowledge:Good  Language: Good  Akathisia:  No  Handed:  Right  AIMS (if indicated):     Assets:  Communication Skills Housing Social Support  Sleep:      Cognition: WNL  ADL's:  Intact   Mental Status Per Nursing Assessment::   On Admission:   Paranoid  Demographic Factors:  Male and Caucasian  Loss Factors: Financial problems/change in socioeconomic status  Historical Factors: Impulsivity  Risk Reduction Factors:   Sense of responsibility to family  Continued Clinical Symptoms:  Depression:   Impulsivity Schizophrenia:   Paranoid or undifferentiated type  Cognitive Features That Contribute To Risk:  Closed-mindedness    Suicide Risk:  Minimal: No identifiable suicidal ideation.  Patients presenting with no risk factors but with morbid ruminations; may be classified as minimal risk based on the severity of the depressive symptoms    Plan Of Care/Follow-up recommendations:  Activity:  as tolerated Diet:  heart healthy  Albert AbbeLaurie Britton Oma Alpert, NP 06/05/2018, 1:22 PM

## 2018-06-05 NOTE — Progress Notes (Signed)
downtime

## 2018-06-05 NOTE — BH Assessment (Signed)
Attempted assessment, TTS and RN witnessed patient to be confused and drowsy, also medicated.

## 2018-10-30 ENCOUNTER — Encounter (HOSPITAL_COMMUNITY): Payer: Self-pay | Admitting: Obstetrics and Gynecology

## 2018-10-30 ENCOUNTER — Inpatient Hospital Stay (HOSPITAL_COMMUNITY)
Admission: AD | Admit: 2018-10-30 | Discharge: 2018-11-05 | DRG: 885 | Disposition: A | Discharge status: Home / Self Care | Payer: Medicare Other | Source: Intra-hospital | Attending: Psychiatry | Admitting: Psychiatry

## 2018-10-30 ENCOUNTER — Emergency Department (HOSPITAL_COMMUNITY)
Admission: EM | Admit: 2018-10-30 | Discharge: 2018-10-30 | Disposition: A | Discharge status: Other Acute Inpatient Hospital | Payer: Medicare Other | Attending: Emergency Medicine | Admitting: Emergency Medicine

## 2018-10-30 ENCOUNTER — Other Ambulatory Visit: Payer: Self-pay

## 2018-10-30 ENCOUNTER — Encounter (HOSPITAL_COMMUNITY): Payer: Self-pay

## 2018-10-30 DIAGNOSIS — Z818 Family history of other mental and behavioral disorders: Secondary | ICD-10-CM | POA: Diagnosis not present

## 2018-10-30 DIAGNOSIS — Z23 Encounter for immunization: Secondary | ICD-10-CM | POA: Diagnosis present

## 2018-10-30 DIAGNOSIS — F29 Unspecified psychosis not due to a substance or known physiological condition: Secondary | ICD-10-CM

## 2018-10-30 DIAGNOSIS — F259 Schizoaffective disorder, unspecified: Secondary | ICD-10-CM | POA: Insufficient documentation

## 2018-10-30 DIAGNOSIS — G4700 Insomnia, unspecified: Secondary | ICD-10-CM | POA: Diagnosis present

## 2018-10-30 DIAGNOSIS — Z79899 Other long term (current) drug therapy: Secondary | ICD-10-CM | POA: Insufficient documentation

## 2018-10-30 DIAGNOSIS — F332 Major depressive disorder, recurrent severe without psychotic features: Secondary | ICD-10-CM | POA: Diagnosis not present

## 2018-10-30 DIAGNOSIS — F250 Schizoaffective disorder, bipolar type: Secondary | ICD-10-CM | POA: Diagnosis present

## 2018-10-30 DIAGNOSIS — F419 Anxiety disorder, unspecified: Secondary | ICD-10-CM | POA: Diagnosis not present

## 2018-10-30 DIAGNOSIS — K219 Gastro-esophageal reflux disease without esophagitis: Secondary | ICD-10-CM | POA: Diagnosis present

## 2018-10-30 LAB — COMPREHENSIVE METABOLIC PANEL
ALBUMIN: 4.4 g/dL (ref 3.5–5.0)
ALT: 20 U/L (ref 0–44)
AST: 23 U/L (ref 15–41)
Alkaline Phosphatase: 47 U/L (ref 38–126)
Anion gap: 8 (ref 5–15)
BUN: 11 mg/dL (ref 6–20)
CO2: 23 mmol/L (ref 22–32)
CREATININE: 0.75 mg/dL (ref 0.61–1.24)
Calcium: 8.6 mg/dL — ABNORMAL LOW (ref 8.9–10.3)
Chloride: 108 mmol/L (ref 98–111)
GFR calc Af Amer: >60 mL/min (ref >60)
GFR calc non Af Amer: >60 mL/min (ref >60)
GLUCOSE: 94 mg/dL (ref 70–99)
Potassium: 4.1 mmol/L (ref 3.5–5.1)
SODIUM: 139 mmol/L (ref 135–145)
Total Bilirubin: 0.3 mg/dL (ref 0.3–1.2)
Total Protein: 7.8 g/dL (ref 6.5–8.1)

## 2018-10-30 LAB — CBC
HCT: 46.9 % (ref 39.0–52.0)
HEMOGLOBIN: 16 g/dL (ref 13.0–17.0)
MCH: 33 pg (ref 26.0–34.0)
MCHC: 34.1 g/dL (ref 30.0–36.0)
MCV: 96.7 fL (ref 80.0–100.0)
Platelets: 268 10*3/uL (ref 150–400)
RBC: 4.85 MIL/uL (ref 4.22–5.81)
RDW: 13 % (ref 11.5–15.5)
WBC: 7.7 10*3/uL (ref 4.0–10.5)
nRBC: 0 % (ref 0.0–0.2)

## 2018-10-30 LAB — RAPID URINE DRUG SCREEN, HOSP PERFORMED
Amphetamines: NOT DETECTED
Barbiturates: NOT DETECTED
Benzodiazepines: NOT DETECTED
Cocaine: NOT DETECTED
Opiates: NOT DETECTED
TETRAHYDROCANNABINOL: NOT DETECTED

## 2018-10-30 LAB — SALICYLATE LEVEL: Salicylate Lvl: <7 mg/dL (ref 2.8–30.0)

## 2018-10-30 LAB — ACETAMINOPHEN LEVEL: Acetaminophen (Tylenol), Serum: <10 ug/mL — ABNORMAL LOW (ref 10–30)

## 2018-10-30 LAB — ETHANOL: Alcohol, Ethyl (B): <10 mg/dL (ref <10)

## 2018-10-30 MED ORDER — QUETIAPINE FUMARATE 100 MG PO TABS
100.0000 mg | ORAL_TABLET | Freq: Every day | ORAL | Status: DC
  Administered 2018-10-30: 100 mg via ORAL
  Filled 2018-10-30 (×3): qty 1

## 2018-10-30 MED ORDER — ACETAMINOPHEN 325 MG PO TABS: 650.0000 mg | ORAL_TABLET | Freq: Four times a day (QID) | ORAL | Status: DC | PRN

## 2018-10-30 MED ORDER — INFLUENZA VAC SPLIT QUAD 0.5 ML IM SUSY
0.5000 mL | PREFILLED_SYRINGE | INTRAMUSCULAR | Status: AC
  Administered 2018-10-31: 0.5 mL via INTRAMUSCULAR
  Filled 2018-10-30: qty 0.5

## 2018-10-30 MED ORDER — HYDROXYZINE HCL 25 MG PO TABS: 25.0000 mg | ORAL_TABLET | Freq: Three times a day (TID) | ORAL | Status: DC | PRN

## 2018-10-30 MED ORDER — TRAZODONE HCL 50 MG PO TABS
50.0000 mg | ORAL_TABLET | Freq: Every evening | ORAL | Status: DC | PRN
  Administered 2018-10-30: 50 mg via ORAL
  Filled 2018-10-30: qty 1

## 2018-10-30 MED ORDER — ALUM & MAG HYDROXIDE-SIMETH 200-200-20 MG/5ML PO SUSP: 30.0000 mL | ORAL | Status: DC | PRN

## 2018-10-30 MED ORDER — DIVALPROEX SODIUM ER 500 MG PO TB24
1500.0000 mg | ORAL_TABLET | Freq: Every day | ORAL | Status: DC
  Administered 2018-10-31 – 2018-11-03 (×3): 1500 mg via ORAL
  Filled 2018-10-30 (×6): qty 3

## 2018-10-30 MED ORDER — QUETIAPINE FUMARATE 100 MG PO TABS: 100.0000 mg | ORAL_TABLET | Freq: Every day | ORAL | Status: DC

## 2018-10-30 MED ORDER — MAGNESIUM HYDROXIDE 400 MG/5ML PO SUSP: 30.0000 mL | Freq: Every day | ORAL | Status: DC | PRN

## 2018-10-30 MED ORDER — DIVALPROEX SODIUM ER 500 MG PO TB24
1500.0000 mg | ORAL_TABLET | Freq: Every day | ORAL | Status: DC
  Administered 2018-10-30: 1500 mg via ORAL
  Filled 2018-10-30: qty 3

## 2018-10-30 NOTE — ED Notes (Signed)
Pt discharged safely with [employer].  Pt was calm and cooperative.  All belongings were sent with patient. 

## 2018-10-30 NOTE — ED Provider Notes (Signed)
TIME SEEN: 2:40 AM  CHIEF COMPLAINT: IVC  HPI: Patient is a 43 year old male with history of paranoid schizophrenia, drug and alcohol abuse who presents to the emergency department under involuntary commitment taken out by his friend Charmian MuffJoshua Eaves.  Per IVC paperwork, patient has been off of his medications for 6 months.  It states that the patient has been talking about "punishing people" and has been talking to voices including his "spiritual advisor Aurther Lofterry".  It also states that patient was threatening people at his AA meeting tonight.   Patient does tell me that he feels paranoid tonight.  Unable to tell me why.  He does not believe that I am the physician.  This makes him very apprehensive to talk to me.  He states that he feels like there was a "negativity" at his AA meeting tonight.  He states that he did not feel "connected".  He states that he got angry with a lady named Mariah at the [employer] meeting tonight because she wrote something and states "the prescription was too small for everyone to read and she should have written it bigger".  He denies any drug or alcohol use and states he has been sober for 2-1/2 years.  He states he has not on any medications.  He states someone asked him at the AA meeting if he was taking his medications and this made him very angry because "this is a personal thing".  He is unable to answer me if he is having any auditory or visual hallucinations.  ROS: See HPI Constitutional: no fever  Eyes: no drainage  ENT: no runny nose   Cardiovascular:  no chest pain  Resp: no SOB  GI: no vomiting GU: no dysuria Integumentary: no rash  Allergy: no hives  Musculoskeletal: no leg swelling  Neurological: no slurred speech ROS otherwise negative  PAST MEDICAL HISTORY/PAST SURGICAL HISTORY:  Past Medical History:  Diagnosis Date  . Alcoholism (HCC)   . Drug abuse (HCC)    xanax addiction 5 years ago  . Medical non-compliance   . Paranoid schizophrenia (HCC)   .  Schizophrenia (HCC)   . Seizures (HCC)     MEDICATIONS:  Prior to Admission medications   Medication Sig Start Date End Date Taking? Authorizing Provider  divalproex (DEPAKOTE ER) 500 MG 24 hr tablet Take 1,500 mg by mouth at bedtime.  10/01/17   [provider]  gabapentin (NEURONTIN) 300 MG capsule Take 1 capsule (300 mg total) by mouth 3 (three) times daily at 8am, 3pm and bedtime. Patient not taking: Reported on 11/21/2017 07/17/17   Oneta RackLewis, Tanika N, NP  omeprazole (PRILOSEC) 40 MG capsule Take 40 mg by mouth daily. 01/25/18   [provider]  pantoprazole (PROTONIX) 40 MG tablet Take 1 tablet (40 mg total) by mouth 2 (two) times daily before a meal. Patient not taking: Reported on 07/07/2017 01/09/17   Adonis BrookAgustin, Sheila, NP  propranolol (INDERAL) 10 MG tablet Take 1 tablet (10 mg total) by mouth 2 (two) times daily. Patient not taking: Reported on 11/21/2017 07/17/17   Oneta RackLewis, Tanika N, NP  QUEtiapine (SEROQUEL) 100 MG tablet Take 100 mg by mouth at bedtime.    [provider]  QUEtiapine (SEROQUEL) 200 MG tablet Take 200 mg by mouth at bedtime.    [provider]  QUEtiapine (SEROQUEL) 300 MG tablet Take 1 tablet (300 mg total) by mouth at bedtime. Patient not taking: Reported on 11/21/2017 07/17/17   Oneta RackLewis, Tanika N, NP  QUEtiapine (SEROQUEL)  50 MG tablet Take 1 tablet (50 mg total) by mouth 2 (two) times daily as needed (severe anxiety/agitation). Patient not taking: Reported on 11/21/2017 07/17/17   Oneta Rack, NP    ALLERGIES:  No Known Allergies  SOCIAL HISTORY:  Social History   Tobacco Use  . Smoking status: Never Smoker  . Smokeless tobacco: Never Used  . Tobacco comment: Non-smoker  Substance Use Topics  . Alcohol use: Not Currently    FAMILY HISTORY: Family History  Problem Relation Age of Onset  . Thyroid disease Mother   . Depression Maternal Aunt     EXAM: BP (!) 133/92 (BP Location: Right Arm)   Pulse 78   Temp 98 F (36.7  C) (Oral)   Resp 15   Ht 5\' 10"  (1.778 m)   Wt 99.8 kg   SpO2 98%   BMI 31.57 kg/m  CONSTITUTIONAL: Alert and oriented and responds appropriately to questions. Well-appearing; well-nourished HEAD: Normocephalic EYES: Conjunctivae clear, pupils appear equal, EOMI ENT: normal nose; moist mucous membranes NECK: Supple, no meningismus, no nuchal rigidity, no LAD  CARD: RRR; S1 and S2 appreciated; no murmurs, no clicks, no rubs, no gallops RESP: Normal chest excursion without splinting or tachypnea; breath sounds clear and equal bilaterally; no wheezes, no rhonchi, no rales, no hypoxia or respiratory distress, speaking full sentences ABD/GI: Normal bowel sounds; non-distended; soft, non-tender, no rebound, no guarding, no peritoneal signs, no hepatosplenomegaly BACK:  The back appears normal and is non-tender to palpation, there is no CVA tenderness EXT: Normal ROM in all joints; non-tender to palpation; no edema; normal capillary refill; no cyanosis, no calf tenderness or swelling    SKIN: Normal color for age and race; warm; no rash NEURO: Moves all extremities equally PSYCH: Very bizarre affect.  Appears to be responding to internal stimuli.  Does report to me that he is paranoid.  Denies SI or HI.  MEDICAL DECISION MAKING: Patient here under IVC.  He appears to be acutely psychotic.  Has history of schizophrenia and is off of his medication.  I will complete my portion of the IVC paperwork.  Screening labs and urine are unremarkable.  Will consult TTS.  ED PROGRESS: TTS recommends inpatient psychiatric treatment and I agree.  Patient is under IVC.   I reviewed all nursing notes, vitals, pertinent previous records, EKGs, lab and urine results, imaging (as available).      , Layla Maw, DO 10/30/18 [phone]

## 2018-10-30 NOTE — ED Notes (Signed)
Pt oriented to room and unit.  Pt is calm and cooperative but appears somewhat childlike and confused.  Pt denies S/I, H/I , and AVH.  Pt is interacting with other patients at this time.

## 2018-10-30 NOTE — BH Assessment (Signed)
BHH Assessment Progress Note  Per Neysa Hotter, MD, this pt requires psychiatric hospitalization.  Berneice Heinrich, RN, [facility] has assigned pt to [facility] Rm 508-2.  Pt presents under IVC initiated by a friend, and upheld by EDP Rochele Raring, MD, and IVC documents have been faxed to [facility].  Pt's nurse, Kendal Hymen, has been notified, and agrees to call report to [phone].  Pt is to be transported via [employer].   Doylene Canning, [place] Behavioral Health Coordinator [phone]

## 2018-10-30 NOTE — ED Triage Notes (Signed)
Per IVC paperwork: Pt reportedly stopped taking his medications 6 months ago and has been hearing voices in his head. Pt reportedly hearing a spiritual advisor named Aurther Loft who tells him to punish people. Pt was reportedly removed from a 12 step program tonight after threatening to hurt someone.

## 2018-10-30 NOTE — Progress Notes (Signed)
Nursing Progress Note: 7p-7a D: Pt currently presents with a sad/tearful/delusional/paranoid/relgious/persecutory affect and behavior. Pt states "I wish I I could just leave. How am I supposed to live like this? Waiting on the doctor and such." Interacting appropriately with the milieu. Pt reports poor sleep during the previous night with current medication regimen. Pt did attend wrap-up group.  A: Pt provided with medications per providers orders. Pt's labs and vitals were monitored throughout the night. Pt supported emotionally and encouraged to express concerns and questions. Pt educated on medications.  R: Pt's safety ensured with 15 minute and environmental checks. Pt currently denies SI, HI, and AVH. Pt verbally contracts to seek staff if SI,HI, or AVH occurs and to consult with staff before acting on any harmful thoughts. Will continue to monitor.

## 2018-10-30 NOTE — ED Notes (Signed)
Bed: [facility] Expected date:  Expected time:  Means of arrival:  Comments: Hold for 27

## 2018-10-30 NOTE — Tx Team (Signed)
Initial Treatment Plan 10/30/2018 7:46 PM [name] KGO:[id number]    PATIENT STRESSORS: Medication change or noncompliance Substance abuse   PATIENT STRENGTHS: Ability for insight General fund of knowledge Physical Health   PATIENT IDENTIFIED PROBLEMS: 1. "Talk about adjusting medications"  2. "Try to figure out what I am going to do to adjust meetings."                   DISCHARGE CRITERIA:  Improved stabilization in mood, thinking, and/or behavior Need for constant or close observation no longer present Safe-care adequate arrangements made  PRELIMINARY DISCHARGE PLAN: Attend 12-step recovery group Outpatient therapy  PATIENT/FAMILY INVOLVEMENT: This treatment plan has been presented to and reviewed with the patient, Albert [name].  The patient and family have been given the opportunity to ask questions and make suggestions.  Kirstie Mirza, RN 10/30/2018, 7:46 PM

## 2018-10-30 NOTE — BH Assessment (Addendum)
Assessment Note  Albert [name] is an 43 y.o. male who presents to the ED under IVC initiated by a friend. Per IVC "respondent stop taking his meds about 6 months ago. He hears voices especially a voice name "Aurther Loft" his spiritual advisor. He talks about punishing people. Tonight at the 12 step program he had to be removed for threaten people there."  Pt appears paranoid during the assessment and frequently asks this Clinical research associate to identify self even though that was done several times. Pt states "someone called the cops on me" at the AA meeting this evening. Pt states he felt like the energy was off and the vibes were negative. Pt states he saw someone in the meeting with something on her phone and it was suspicious to him so he "cornered her" to question her about it. Pt states he was asked if he is taking his medications during the AA meeting and he states this question made him angry because he does not want to share the fact that he has to take medications to strangers.   Pt is tangential during the assessment. 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   Per Donell Sievert, PA pt is recommended for inpt treatment. TTS to seek placement. EDP Ward, Layla Maw, DO and pt's nurse Junior, RN have been advised.   Diagnosis: Schizoaffective disorder  Past Medical History:  Past Medical History:  Diagnosis Date  . Alcoholism (HCC)   . Drug abuse (HCC)    xanax addiction 5 years ago  . Medical non-compliance   . Paranoid schizophrenia (HCC)   . Schizophrenia (HCC)   . Seizures (HCC)     History reviewed. No pertinent surgical history.  Family History:  Family History  Problem Relation Age of Onset  . Thyroid disease Mother   . Depression  Maternal Aunt     Social History:  reports that he has never smoked. He has never used smokeless tobacco. He reports previous alcohol use. He reports previous drug use. Drug: Marijuana.  Additional Social History:  Alcohol / Drug Use Pain Medications: See MAR Prescriptions: See MAR Over the Counter: See MAR History of alcohol / drug use?: Yes Longest period of sobriety (when/how long): 2.5 years Substance #1 Name of Substance 1: Alcohol 1 - Age of First Use: teens 1 - Amount (size/oz): pt sober 1 - Frequency: none 1 - Duration: years 1 - Last Use / Amount: 2.5 years ago  CIWA: CIWA-Ar BP: (!) 133/92 Pulse Rate: 78 COWS:    Allergies: No Known Allergies  Home Medications: (Not in a hospital admission)   OB/GYN Status:  No LMP for male patient.  General Assessment Data Location of Assessment: [facility] TTS Assessment: In system Is this a Tele or Face-to-Face Assessment?: Face-to-Face Is this an Initial Assessment or a Re-assessment for this encounter?: Initial Assessment Patient Accompanied by:: N/A Language Other than English: No Living Arrangements: Other (Comment) What gender do you identify as?: Male Marital status: Single Pregnancy Status: No Living Arrangements: Alone Can pt return to current living arrangement?: Yes Admission Status: Involuntary Petitioner: Other(friend) Is patient capable of signing voluntary admission?: No Referral Source: Self/Family/Friend Insurance type: [facility]  Crisis Care Plan Living Arrangements: Alone Name of Psychiatrist: Ellamae SiaKeshavpal Reddy, M.D. Name of Therapist: [facility]  Education Status Is patient currently in school?: No Is the patient employed, unemployed or receiving disability?: Receiving disability income  Risk to self with the past 6 months Suicidal Ideation: No Has patient been a risk to self within the past 6 months prior to admission? : No Suicidal Intent: No Has patient had any suicidal  intent within the past 6 months prior to admission? : No Is patient at risk for suicide?: No Suicidal Plan?: No Has patient had any suicidal plan within the past 6 months prior to admission? : No Access to Means: No What has been your use of drugs/alcohol within the last 12 months?: sober for 2.5 years Previous Attempts/Gestures: No Triggers for Past Attempts: None known Intentional Self Injurious Behavior: None Family Suicide History: No Recent stressful life event(s): Other (Comment)(not taking meds) Persecutory voices/beliefs?: Yes Depression: No Substance abuse history and/or treatment for substance abuse?: Yes Suicide prevention information given to non-admitted patients: Not applicable  Risk to Others within the past 6 months Homicidal Ideation: No Does patient have any lifetime risk of violence toward others beyond the six months prior to admission? : Yes (comment)(making threats to others) Thoughts of Harm to Others: Yes-Currently Present Comment - Thoughts of Harm to Others: per IVC, pt made threats to hurt others Current Homicidal Intent: No Current Homicidal Plan: No Access to Homicidal Means: No History of harm to others?: No Assessment of Violence: None Noted Does patient have access to weapons?: No Criminal Charges Pending?: No Does patient have a court date: No Is patient on probation?: No  Psychosis Hallucinations: Auditory, Visual Delusions: Unspecified  Mental Status Report Appearance/Hygiene: Unremarkable, In scrubs Eye Contact: Good Motor Activity: Restlessness Speech: Rapid, Tangential Level of Consciousness: Alert, Restless Mood: Preoccupied Affect: Threatening Anxiety Level: None Thought Processes: Flight of Ideas, Tangential Judgement: Impaired Orientation: Person, Place, Time Obsessive Compulsive Thoughts/Behaviors: None  Cognitive Functioning Concentration: Fair Memory: Recent Intact, Remote Intact Is patient IDD: No Insight:  Poor Impulse Control: Poor Appetite: Fair Have you had any weight changes? : No Change Sleep: Increased Total Hours of Sleep: 12 Vegetative Symptoms: Staying in bed  ADLScreening [facility] Services) Patient's cognitive ability adequate to safely complete daily activities?: Yes Patient able to express need for assistance with ADLs?: Yes Independently performs ADLs?: Yes (appropriate for developmental age)  Prior Inpatient Therapy Prior Inpatient Therapy: Yes Prior Therapy Dates: 2018, 2014 [facility] Reason for Treatment: SCHIZOPHRENIA  Prior Outpatient Therapy Prior Outpatient Therapy: Yes Prior Therapy Dates: CURRENT Prior Therapy Facilty/Provider(s): [facility] Reason for Treatment: SCHIZOPHRENIA Does patient have an ACCT team?: No Does patient have Intensive In-House Services?  : No Does patient have [facility] services? : No Does patient have P4CC services?: No  ADL Screening (condition at time of admission) Patient's cognitive ability adequate to safely complete daily activities?: Yes Is the patient deaf or have difficulty hearing?: No Does the patient have difficulty seeing, even when wearing glasses/contacts?: No Does the patient have difficulty concentrating, remembering, or making decisions?: Yes Patient able to express need for assistance with ADLs?: Yes Does the patient have difficulty dressing or bathing?: No Independently performs ADLs?: Yes (appropriate for developmental age) Does the patient have difficulty walking or climbing stairs?: No Weakness of Legs: None Weakness of Arms/Hands: None  Home Assistive Devices/Equipment Home Assistive Devices/Equipment: None    Abuse/Neglect Assessment (Assessment to be complete  while patient is alone) Abuse/Neglect Assessment Can Be Completed: Yes Physical Abuse: Denies Verbal Abuse: Denies Sexual Abuse: Denies Exploitation of patient/patient's resources:  Denies Self-Neglect: Denies     Merchant navy officer (For Healthcare) Does Patient Have a Medical Advance Directive?: No Would patient like information on creating a medical advance directive?: No - Patient declined          Disposition: Per Donell Sievert, PA pt is recommended for inpt treatment. TTS to seek placement. EDP Ward, Layla Maw, DO and pt's nurse Junior, RN have been advised.  Disposition Initial Assessment Completed for this Encounter: Yes Disposition of Patient: Admit Type of inpatient treatment program: Adult Patient refused recommended treatment: No  On Site Evaluation by:   Reviewed with Physician:    [name] 10/30/2018 4:31 AM

## 2018-10-30 NOTE — ED Notes (Signed)
Bed: WTR5 Expected date:  Expected time:  Means of arrival:  Comments: 

## 2018-10-30 NOTE — Progress Notes (Signed)
Patient ID: Roche Hartel, male   DOB: 1976/10/12, 43 y.o.   MRN: [id number] D: Patient arrived to [facility] unit IVC from [facility]. Patient has a history of schizophrenia and have been off of his meds for the past 6 months. He has a history of substance abuse, but has been clean for 2 years. Previously using alcohol and benzos and marijuana. He goes to [employer] and this is his main source of support. However, he has become increasingly paranoid that they are rejecting him and distancing themselves from him. He became aggressive and threatening at a recent meeting and they called the police and had him IVCed. During admission he had trouble following instructions including for the skin assessment. He displayed thought blocking and appeared to be responding to internal stimuli. A: Skin assessment performed per protocol, no contraband noted, and was unremarkable. Patient belongings placed in locker. Unit orientation completed. Care plan and unit routines reviewed with patient, understanding verbalized. Emotional support offered to patient. Encouraged patient to voice concerns. Fluids offered to patient. Q15 minute checks initiated for safety on and off unit.  R: Patient voices no concerns.

## 2018-10-30 NOTE — Progress Notes (Signed)
Did not attend group 

## 2018-10-30 NOTE — Progress Notes (Signed)
Per Donell Sievert, PA pt is recommended for inpt treatment. TTS to seek placement. EDP Ward, Layla Maw, DO and pt's nurse Junior, RN have been advised.   Princess Bruins, MSW, LCSW Therapeutic Triage Specialist  [phone]

## 2018-10-30 NOTE — Progress Notes (Signed)
Patient ID: Albert [name], male   DOB: 10/07/1976, 43 y.o.   MRN: [id number] PER STATE REGULATIONS 482.30  THIS CHART WAS REVIEWED FOR MEDICAL NECESSITY WITH RESPECT TO THE PATIENT'S ADMISSION/DURATION OF STAY.  NEXT REVIEW DATE:11/03/18  Loura HaltBARBARA Kaitlyn Franko, RN, BSN CASE MANAGER

## 2018-10-31 DIAGNOSIS — F250 Schizoaffective disorder, bipolar type: Principal | ICD-10-CM

## 2018-10-31 MED ORDER — TRAZODONE HCL 100 MG PO TABS
200.0000 mg | ORAL_TABLET | Freq: Every day | ORAL | Status: DC
  Filled 2018-10-31 (×7): qty 2

## 2018-10-31 MED ORDER — BENZTROPINE MESYLATE 0.5 MG PO TABS
0.5000 mg | ORAL_TABLET | Freq: Two times a day (BID) | ORAL | Status: DC
  Filled 2018-10-31 (×8): qty 1

## 2018-10-31 MED ORDER — LORAZEPAM 1 MG PO TABS: 1.0000 mg | ORAL_TABLET | ORAL | Status: DC | PRN

## 2018-10-31 MED ORDER — DIVALPROEX SODIUM ER 500 MG PO TB24
1500.0000 mg | ORAL_TABLET | Freq: Every day | ORAL | Status: DC
  Administered 2018-10-31 – 2018-11-04 (×4): 1500 mg via ORAL
  Filled 2018-10-31 (×7): qty 3

## 2018-10-31 MED ORDER — PERPHENAZINE 4 MG PO TABS: 6.0000 mg | ORAL_TABLET | Freq: Two times a day (BID) | ORAL | Status: DC

## 2018-10-31 MED ORDER — QUETIAPINE FUMARATE 100 MG PO TABS
100.0000 mg | ORAL_TABLET | Freq: Three times a day (TID) | ORAL | Status: DC
  Administered 2018-10-31 (×2): 100 mg via ORAL
  Filled 2018-10-31 (×9): qty 1

## 2018-10-31 MED ORDER — LORAZEPAM 2 MG/ML IJ SOLN: 2.0000 mg | INTRAMUSCULAR | Status: DC | PRN

## 2018-10-31 NOTE — Progress Notes (Signed)
PT attended spirituality group led by chaplain.   Group focused on topic of hope.  Group members engaged in facilitated discussion around topic of hope, identifying areas they have experienced hope in their life and resources for hope.   Engaged in visual explorer activity to identify what hope means for them today.   

## 2018-10-31 NOTE — [facility] Counselor (Signed)
Adult Comprehensive Assessment  Patient ID: Albert [name], male   DOB: [birth year], 43 y.o.   MRN: [id number]  Information Source: Information source: Patient  Current Stressors:  Employment / Job issues: Engineer, building services / Lack of resources (include bankruptcy): Fixed income Substance abuse: Attends AA daily, has met with the same group for 2.5 years, was banned following last meeting and having a hard time with this.  Family relationships: Patient reports having firm boundaries with family members Social relationships: Limited supports; not welcome back to Albert [name] group, thinks his friends have all turned against him  Living/Environment/Situation:  Living Ar[place] Living conditions (as described by patient or guardian): Good How long has patient lived in current situation?: Since 2007 What is atmosphere in current home: Comfortable  Family History:  Marital status: Single Does patient have children?: No  Childhood History:  By whom was/is the patient raised?: Both parents Additional childhood history information: Mother mainly took care of Albert [name] Description of patient's relationship with caregiver when they were a child: Good Patient's description of current relationship with people who raised him/her: Good Does patient have siblings?: Yes Number of Siblings: 3 (Has a twin sister and a half brother/sister) Description of patient's current relationship with siblings: Pretty good Did patient suffer any verbal/emotional/physical/sexual abuse as a child?: No Did patient suffer from severe childhood neglect?: No Has patient ever been sexually abused/assaulted/raped as an adolescent or adult?: No Was the patient ever a victim of a crime or a disaster?: No Witnessed domestic violence?: No Has patient been effected by domestic violence as an adult?: No  Education:  Highest grade of school patient has completed: 12th grade Currently a student?:  No Learning disability?: No  Employment/Work Situation:  Employment situation: On disability Why is patient on disability: Bipolar/Depression How long has patient been on disability: about 5 years Patient's job has been impacted by current illness: No What is the longest time patient has a held a job?: 3 years 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 Has patient ever been in the [employer]?: No Has patient ever served in Recruitment consultant?: No  Financial Resources:  Museum/gallery curator resources: Teacher, early years/pre Does patient have a Programmer, applications or guardian?: No  Alcohol/Substance Abuse:  What has been your use of drugs/alcohol within the last 12 months?: None If attempted suicide, did drugs/alcohol play a role in this?: No Alcohol/Substance Abuse Treatment Hx: Denies past history If yes, describe treatment: N/A Has alcohol/substance abuse ever caused legal problems?: No  Social Support [name]: Pensions consultant Support [name]: Good Describe Community Support [name]: Family Type of faith/religion: [place] How does patient's faith help to cope with current illness?: Unable to say  Leisure/Recreation:  Leisure and Hobbies: Bike riding, Research officer, trade union, conversations with others  Strengths/Needs:  What things does the patient do well?: Honesty, loyalty and integrity. Learning to have good human relations In what areas does patient struggle / problems for patient: Patient spoke at length about sensing hate and negative energy  Discharge Plan:  Does patient have access to transportation?: Yes Will patient be returning to same living situation after discharge?: Yes Currently receiving community mental health services: Yes (From Whom) Patient states he sees Dr.Reddy for medication management and has an appointment on 11/11/2018. Patient states he is set to begin seeing a therapist within Dr.Reddy's practice soon and wants to see how it goes. Chart review indicates that  patient reports not taking medications for months.  If no, would patient like referral for services  when discharged?: Does patient have financial barriers related to discharge medications?: No  Summary/Recommendations:   Summary and Recommendations (to be completed by the evaluator): Albert [name] is a 43 year old male from [name] with a history of schizophrenia and substance use. Patient admitted to [name] following an altercation at an Albert [name] meeting where the patient acted threatening and aggressive. Patient has been sober for 3 years and has consistently attended meetings at that location. Patient ruminating on feeling isolated and "sensing hatred" from other Albert [name] attendees. Focused on finding new AA community; has been non-compliant with meds for months. This is the patient's 3rd Albert [name] admission. Patient would benefit from crisis stabilization, therapeutic milieu, medication management, and referral resources. Patient will likely benefit from ACTT referrals.   [name]. 10/31/2018

## 2018-10-31 NOTE — Tx Team (Signed)
Interdisciplinary Treatment and Diagnostic Plan Update  10/31/2018 Time of Session: 9:00am Albert [name] MRN: [id number]  Principal Diagnosis: <principal problem not specified>  Secondary Diagnoses: Active Problems:   Schizoaffective disorder, bipolar type (HCC)   Current Medications:  Current Facility-Administered Medications  Medication Dose Route Frequency Provider Last Rate Last Dose  . acetaminophen (TYLENOL) tablet 650 mg  650 mg Oral Q6H PRN Ethelene Hal, NP      . alum & mag hydroxide-simeth (MAALOX/MYLANTA) 200-200-20 MG/5ML suspension 30 mL  30 mL Oral Q4H PRN Ethelene Hal, NP      . benztropine (COGENTIN) tablet 0.5 mg  0.5 mg Oral BID Johnn Hai, MD      . divalproex (DEPAKOTE ER) 24 hr tablet 1,500 mg  1,500 mg Oral Daily Ethelene Hal, NP   1,500 mg at 10/31/18 0749  . divalproex (DEPAKOTE ER) 24 hr tablet 1,500 mg  1,500 mg Oral QHS Johnn Hai, MD      . hydrOXYzine (ATARAX/VISTARIL) tablet 25 mg  25 mg Oral TID PRN Ethelene Hal, NP      . LORazepam (ATIVAN) tablet 1 mg  1 mg Oral Q4H PRN Johnn Hai, MD       Or  . LORazepam (ATIVAN) injection 2 mg  2 mg Intramuscular Q4H PRN Johnn Hai, MD      . magnesium hydroxide (MILK OF MAGNESIA) suspension 30 mL  30 mL Oral Daily PRN Ethelene Hal, NP      . QUEtiapine (SEROQUEL) tablet 100 mg  100 mg Oral TID Johnn Hai, MD      . traZODone (DESYREL) tablet 200 mg  200 mg Oral QHS Johnn Hai, MD       PTA Medications: Medications Prior to Admission  Medication Sig Dispense Refill Last Dose  . divalproex (DEPAKOTE ER) 500 MG 24 hr tablet Take 1,500 mg by mouth at bedtime.   0 Not Taking at Unknown time  . gabapentin (NEURONTIN) 300 MG capsule Take 1 capsule (300 mg total) by mouth 3 (three) times daily at 8am, 3pm and bedtime. (Patient not taking: Reported on 11/21/2017) 30 capsule 0 Not Taking at Unknown time  . omeprazole (PRILOSEC) 40 MG capsule Take 40 mg by mouth  daily.  0 Not Taking at Unknown time  . pantoprazole (PROTONIX) 40 MG tablet Take 1 tablet (40 mg total) by mouth 2 (two) times daily before a meal. (Patient not taking: Reported on 07/07/2017) 60 tablet 0 Not Taking at Unknown time  . propranolol (INDERAL) 10 MG tablet Take 1 tablet (10 mg total) by mouth 2 (two) times daily. (Patient not taking: Reported on 11/21/2017) 60 tablet 0 Not Taking at Unknown time  . QUEtiapine (SEROQUEL) 100 MG tablet Take 100 mg by mouth at bedtime.   Not Taking at Unknown time  . QUEtiapine (SEROQUEL) 200 MG tablet Take 200 mg by mouth at bedtime.   Not Taking at Unknown time  . QUEtiapine (SEROQUEL) 300 MG tablet Take 1 tablet (300 mg total) by mouth at bedtime. (Patient not taking: Reported on 11/21/2017) 30 tablet 0 Not Taking at Unknown time  . QUEtiapine (SEROQUEL) 50 MG tablet Take 1 tablet (50 mg total) by mouth 2 (two) times daily as needed (severe anxiety/agitation). (Patient not taking: Reported on 11/21/2017) 30 tablet 0 Not Taking at Unknown time    Patient Stressors: Medication change or noncompliance Substance abuse  Patient Strengths: Ability for insight General fund of knowledge Physical Health  Treatment Modalities: Medication Management, Group  therapy, Case management,  1 to 1 session with clinician, Psychoeducation, Recreational therapy.   Physician Treatment Plan for Primary Diagnosis: <principal problem not specified> Long Term Goal(s): Improvement in symptoms so as ready for discharge Improvement in symptoms so as ready for discharge   Short Term Goals: Ability to identify changes in lifestyle to reduce recurrence of condition will improve Ability to verbalize feelings will improve Ability to disclose and discuss suicidal ideas Ability to maintain clinical measurements within normal limits will improve Compliance with prescribed medications will improve Ability to identify triggers associated with substance abuse/mental health issues will  improve  Medication Management: Evaluate patient's response, side effects, and tolerance of medication regimen.  Therapeutic Interventions: 1 to 1 sessions, Unit Group sessions and Medication administration.  Evaluation of Outcomes: Not Met  Physician Treatment Plan for Secondary Diagnosis: Active Problems:   Schizoaffective disorder, bipolar type ([place])  Long Term Goal(s): Improvement in symptoms so as ready for discharge Improvement in symptoms so as ready for discharge   Short Term Goals: Ability to identify changes in lifestyle to reduce recurrence of condition will improve Ability to verbalize feelings will improve Ability to disclose and discuss suicidal ideas Ability to maintain clinical measurements within normal limits will improve Compliance with prescribed medications will improve Ability to identify triggers associated with substance abuse/mental health issues will improve     Medication Management: Evaluate patient's response, side effects, and tolerance of medication regimen.  Therapeutic Interventions: 1 to 1 sessions, Unit Group sessions and Medication administration.  Evaluation of Outcomes: Not Met   RN Treatment Plan for Primary Diagnosis: <principal problem not specified> Long Term Goal(s): Knowledge of disease and therapeutic regimen to maintain health will improve  Short Term Goals: Ability to verbalize frustration and anger appropriately will improve, Ability to demonstrate self-control, Ability to disclose and discuss suicidal ideas, Ability to identify and develop effective coping behaviors will improve and Compliance with prescribed medications will improve  Medication Management: RN will administer medications as ordered by provider, will assess and evaluate patient's response and provide education to patient for prescribed medication. RN will report any adverse and/or side effects to prescribing provider.  Therapeutic Interventions: 1 on 1 counseling  sessions, Psychoeducation, Medication administration, Evaluate responses to treatment, Monitor vital signs and CBGs as ordered, Perform/monitor CIWA, COWS, AIMS and Fall Risk screenings as ordered, Perform wound care treatments as ordered.  Evaluation of Outcomes: Not Met   LCSW Treatment Plan for Primary Diagnosis: <principal problem not specified> Long Term Goal(s): Safe transition to appropriate next level of care at discharge, Engage patient in therapeutic group addressing interpersonal concerns.  Short Term Goals: Engage patient in aftercare planning with referrals and resources, Increase social support, Increase emotional regulation, Identify triggers associated with mental health/substance abuse issues and Increase skills for wellness and recovery  Therapeutic Interventions: Assess for all discharge needs, 1 to 1 time with Social worker, Explore available resources and support systems, Assess for adequacy in community support network, Educate family and significant other(s) on suicide prevention, Complete Psychosocial Assessment, Interpersonal group therapy.  Evaluation of Outcomes: Not Met   Progress in Treatment: Attending groups: No. Participating in groups: No. Taking medication as prescribed: Yes. Toleration medication: Yes. Family/Significant other contact made: No, will contact:  supports if consent is granted Patient understands diagnosis: Yes. Discussing patient identified problems/goals with staff: Yes. Medical problems stabilized or resolved: No. Denies suicidal/homicidal ideation: Yes. Issues/concerns per patient self-inventory: No.  New problem(s) identified: No, Describe:  CSW continuing to assess  New Short Term/Long Term Goal(s): medication management for mood stabilization; elimination of SI thoughts; development of comprehensive mental wellness/sobriety plan.  Patient Goals: "Talk about adjusting medications"  Discharge Plan or Barriers: [place] pamphlet, [place]  Crisis information, and AA/NA information provided to patient for additional community support and resources.   Reason for Continuation of Hospitalization: Aggression Anxiety Delusions  Depression Hallucinations Medication stabilization  Estimated Length of Stay: 5-7 days  Attendees: Patient: 10/31/2018 10:52 AM  Physician:  10/31/2018 10:52 AM  Nursing:  10/31/2018 10:52 AM  RN Care Manager: 10/31/2018 10:52 AM  Social Worker: Stephanie Acre, [place] 10/31/2018 10:52 AM  Recreational Therapist:  10/31/2018 10:52 AM  Other:  10/31/2018 10:52 AM  Other:  10/31/2018 10:52 AM  Other: 10/31/2018 10:52 AM    Scribe for Treatment Team: [name], [place] 10/31/2018 10:52 AM

## 2018-10-31 NOTE — Progress Notes (Signed)
Nursing Progress Note: 7p-7a D: Pt currently presents with a paranoid/depressed/worried/preoccupied affect and behavior. Pt states "I dont know if I should take trazodone. I just don't know." Interacting minimally with the milieu. Pt reports good sleep during the previous night with current medication regimen. Pt did attend wrap-up group.  A: Pt provided with medications per providers orders. Pt's labs and vitals were monitored throughout the night. Pt supported emotionally and encouraged to express concerns and questions. Pt educated on medications.  R: Pt's safety ensured with 15 minute and environmental checks. Pt currently denies SI, HI, and AVH. Pt verbally contracts to seek staff if SI,HI, or AVH occurs and to consult with staff before acting on any harmful thoughts. Will continue to monitor.
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
## 2018-10-31 NOTE — Plan of Care (Signed)
Progress note  D: pt found in bed; compliant with medication administration. Pt states he slept well. Pt rates his depression/hopelessness/anxiety a 4/3/5 out of 10 respectively. Pt denies any physical problems or pain, rating his pain a 0/10. Pt states his goal for today is "gods when I get out" and he will achieve this by talking to the doctor and treatment staff. Pt denies any si/hi/ah/vh and verbally agrees to approach staff if these become apparent or before harming himself while at [facility]. Pt is still paranoid and feels the doctor "isn't working for me" since he wanted to try him on a new medication.  A: pt provided support and encouragement. Pt given medication per protocol and standing orders. Q93m safety checks implemented and continued.  R: pt safe on the unit. Will continue to monitor.   Pt progressing in the following metrics  Problem: Education: Goal: Ability to make informed decisions regarding treatment will improve Outcome: Progressing   Problem: Medication: Goal: Compliance with prescribed medication regimen will improve Outcome: Progressing   Problem: Self-Concept: Goal: Ability to disclose and discuss suicidal ideas will improve Outcome: Progressing Goal: Will verbalize positive feelings about self Outcome: Progressing

## 2018-10-31 NOTE — [facility] Group Notes (Signed)
Adult Psychoeducational Group Note  Date:  10/31/2018 Time:  10:58 PM  Group Topic/Focus:  Wrap-Up Group:   The focus of this group is to help patients review their daily goal of treatment and discuss progress on daily workbooks.  Participation Level:  Active  Participation Quality:  Appropriate and Attentive  Affect:  Appropriate  Cognitive:  Alert and Appropriate  Insight: Appropriate and Good  Engagement in Group:  Engaged  Modes of Intervention:  Discussion and Education  Additional Comments:  Pt attended and participated in wrap up group this evening. Pt rated their day a 6/10, due to them going to groups. Pt completed their goal, which was to get their meds regulated.   Chrisandra Netters 10/31/2018, 10:58 PM
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
## 2018-11-01 DIAGNOSIS — F419 Anxiety disorder, unspecified: Secondary | ICD-10-CM

## 2018-11-01 DIAGNOSIS — G4700 Insomnia, unspecified: Secondary | ICD-10-CM

## 2018-11-01 DIAGNOSIS — F332 Major depressive disorder, recurrent severe without psychotic features: Secondary | ICD-10-CM

## 2018-11-01 LAB — VALPROIC ACID LEVEL: Valproic Acid Lvl: 67 ug/mL (ref 50.0–100.0)

## 2018-11-01 MED ORDER — QUETIAPINE FUMARATE 50 MG PO TABS: 50.0000 mg | ORAL_TABLET | ORAL | Status: DC | PRN

## 2018-11-01 MED ORDER — QUETIAPINE FUMARATE 200 MG PO TABS
200.0000 mg | ORAL_TABLET | Freq: Every day | ORAL | Status: DC
  Administered 2018-11-01 – 2018-11-04 (×4): 200 mg via ORAL
  Filled 2018-11-01 (×6): qty 1

## 2018-11-01 NOTE — [facility] Group Notes (Signed)
Adult Psychoeducational Group Note  Date:  11/01/2018 Time:  4:01 PM  Group Topic/Focus:  Personal Choices and Values:   The focus of this group is to help patients assess and explore the importance of values in their lives, how their values affect their decisions, how they express their values and what opposes their expression.  Participation Level:  Active  Participation Quality:  Appropriate and Attentive  Affect:  Appropriate and Flat  Cognitive:  Appropriate  Insight: Appropriate  Engagement in Group:  Engaged  Modes of Intervention:  Activity, Discussion, Exploration, Problem-solving, Rapport Building, Socialization and Support  Additional Comments:  Pt attended and participated during RN Psychoeducational group this morning.  Tania Ade 11/01/2018, 4:01 PM

## 2018-11-01 NOTE — Plan of Care (Signed)
Pt presents with a flat affect and a labile mood. Pt noted to be sarcastic, entitled and rude this morning towards staff for no apparent reasons. Pt rated on his self inventory sheet: depression 3/10, anxiety 3/10 and hopelessness 3/10. Pt denies SI/HI. Pt reported fair sleep last night. Pt refused to take morning medications and stated that he wanted to talk to the doctor before taking any medications today.   Medications reviewed with pt. Medications offered to pt as scheduled per MD. Verbal support provided. Pt encouraged to attend groups. 15 minute checks performed for safety.  Pt compliant with attending scheduled groups.   Care plan Problem: Health Behavior/Discharge Planning: Goal: Compliance with prescribed medication regimen will improve Outcome: Not Progressing

## 2018-11-01 NOTE — Progress Notes (Signed)
[facility] MD Progress Note  11/01/2018 11:59 AM Albert [name]  MRN:  [id number] Subjective: Patient is seen and examined.  Patient's 43 year old male with a past psychiatric history significant for schizoaffective disorder who was admitted on 10/31/2018 secondary to the fact he became disruptive at an AA meeting, was escorted to the emergency department for evaluation.  He was clearly paranoid, and had some anger and irritability issues.  Was admitted to the hospital for evaluation and stabilization.  Objective: Patient is a 43 year old male with the above-stated past psychiatric history was seen in follow-up.  He was admitted on 10/31/2018.  See above for the explanation.  During today's discussion he was concerned because the Seroquel dose he was on here was "too much".  He stated it was higher than he had taken at home, made him feel very tired and lethargic.  He stated he had been taking his medications prior to the hospitalization.  He talks about how his anger issues are beginning to encompass him.  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  He stated he was in some form of a day program where he went to see the group 3 days a week, and when they told him not to return "I lost the 20 friends I had, I always look forward to going back to seeing them".  He is mildly pressured today, and tangential.  He is currently on Depakote 1500 mg p.o. daily and 1500 mg p.o. nightly.  He is also on Seroquel 100 mg p.o. 3 times daily.  Unfortunately on admission they did not obtain a Depakote level.  He denied auditory or visual hallucinations.  He denied suicidal or homicidal ideation.  Principal Problem: <principal problem not specified> Diagnosis: Active Problems:   Schizoaffective disorder, bipolar type (HCC)  Total Time spent with patient: 30 minutes  Past Psychiatric History: See admission H&P  Past Medical History:   Past Medical History:  Diagnosis Date  . Alcoholism (HCC)   . Drug abuse (HCC)    xanax addiction 5 years ago  . Medical non-compliance   . Paranoid schizophrenia (HCC)   . Schizophrenia (HCC)   . Seizures (HCC)    History reviewed. No pertinent surgical history. Family History:  Family History  Problem Relation Age of Onset  . Thyroid disease Mother   . Depression Maternal Aunt    Family Psychiatric  History: See admission H&P Social History:  Social History   Substance and Sexual Activity  Alcohol Use Not Currently     Social History   Substance and Sexual Activity  Drug Use Not Currently  . Types: Marijuana   Comment: xanax- several years ago    Social History   Socioeconomic History  . Marital status: Single    Spouse name: Not on file  . Number of children: Not on file  . Years of education: Not on file  . Highest education level: Not on file  Occupational History  . Not on file  Social Needs  . Financial resource strain: Not on file  . Food insecurity:    Worry: Not on file    Inability: Not on file  . Transportation needs:    Medical: Not on file    Non-medical: Not on file  Tobacco Use  . Smoking status: Never Smoker  . Smokeless tobacco: Never Used  . Tobacco comment: Non-smoker  Substance and Sexual Activity  .  Alcohol use: Not Currently  . Drug use: Not Currently    Types: Marijuana    Comment: xanax- several years ago  . Sexual activity: Not Currently  Lifestyle  . Physical activity:    Days per week: Not on file    Minutes per session: Not on file  . Stress: Not on file  Relationships  . Social connections:    Talks on phone: Not on file    Gets together: Not on file    Attends religious service: Not on file    Active member of club or organization: Not on file    Attends meetings of clubs or organizations: Not on file    Relationship status: Not on file  Other Topics Concern  . Not on file  Social History Narrative   ** Merged  History Encounter **       Additional Social History:                         Sleep: Fair  Appetite:  Fair  Current Medications: Current Facility-Administered Medications  Medication Dose Route Frequency Provider Last Rate Last Dose  . acetaminophen (TYLENOL) tablet 650 mg  650 mg Oral Q6H PRN Laveda AbbeParks, Laurie Britton, NP      . alum & mag hydroxide-simeth (MAALOX/MYLANTA) 200-200-20 MG/5ML suspension 30 mL  30 mL Oral Q4H PRN Laveda AbbeParks, Laurie Britton, NP      . benztropine (COGENTIN) tablet 0.5 mg  0.5 mg Oral BID Malvin JohnsFarah, Brian, MD   Stopped at 11/01/18 [phone]  . divalproex (DEPAKOTE ER) 24 hr tablet 1,500 mg  1,500 mg Oral Daily Laveda AbbeParks, Laurie Britton, NP   1,500 mg at 10/31/18 0749  . divalproex (DEPAKOTE ER) 24 hr tablet 1,500 mg  1,500 mg Oral QHS Malvin JohnsFarah, Brian, MD   1,500 mg at 10/31/18 2132  . hydrOXYzine (ATARAX/VISTARIL) tablet 25 mg  25 mg Oral TID PRN Laveda AbbeParks, Laurie Britton, NP      . LORazepam (ATIVAN) tablet 1 mg  1 mg Oral Q4H PRN Malvin JohnsFarah, Brian, MD       Or  . LORazepam (ATIVAN) injection 2 mg  2 mg Intramuscular Q4H PRN Malvin JohnsFarah, Brian, MD      . magnesium hydroxide (MILK OF MAGNESIA) suspension 30 mL  30 mL Oral Daily PRN Laveda AbbeParks, Laurie Britton, NP      . QUEtiapine (SEROQUEL) tablet 100 mg  100 mg Oral TID Malvin JohnsFarah, Brian, MD   100 mg at 10/31/18 1717  . traZODone (DESYREL) tablet 200 mg  200 mg Oral QHS Malvin JohnsFarah, Brian, MD        Lab Results: No results found for this or any previous visit (from the past 48 hour(s)).  Blood Alcohol level:  Lab Results  Component Value Date   ETH <10 10/30/2018   ETH <10 06/04/2018    Metabolic Disorder Labs: Lab Results  Component Value Date   HGBA1C 5.1 01/05/2017   MPG 100 01/05/2017   Lab Results  Component Value Date   PROLACTIN 30.4 (H) 01/06/2017   PROLACTIN 13.1 01/05/2017   Lab Results  Component Value Date   CHOL 172 01/05/2017   TRIG 112 01/05/2017   HDL 41 01/05/2017   CHOLHDL 4.2 01/05/2017   VLDL 22 01/05/2017    LDLCALC 109 (H) 01/05/2017    Physical Findings: AIMS: Facial and Oral Movements Muscles of Facial Expression: None, normal Lips and Perioral Area: None, normal Jaw: None, normal Tongue: None, normal,Extremity Movements Upper (arms, wrists,  hands, fingers): None, normal Lower (legs, knees, ankles, toes): None, normal, Trunk Movements Neck, shoulders, hips: None, normal, Overall Severity Severity of abnormal movements (highest score from questions above): None, normal Incapacitation due to abnormal movements: None, normal Patient's awareness of abnormal movements (rate only patient's report): No Awareness, Dental Status Current problems with teeth and/or dentures?: No Does patient usually wear dentures?: No  CIWA:  CIWA-Ar Total: 1 COWS:  COWS Total Score: 0  Musculoskeletal: Strength & Muscle Tone: within normal limits Gait & Station: normal Patient leans: N/A  Psychiatric Specialty Exam: Physical Exam  Nursing note and vitals reviewed. Constitutional: He is oriented to person, place, and time. He appears well-developed and well-nourished.  HENT:  Head: Normocephalic and atraumatic.  Respiratory: Effort normal.  Neurological: He is alert and oriented to person, place, and time.    ROS  Blood pressure 104/69, pulse 75, temperature 97.8 F (36.6 C), temperature source Oral, resp. rate 18, height 5\' 10"  (1.778 m), weight 99.8 kg, SpO2 98 %.Body mass index is 31.57 kg/m.  General Appearance: Casual  Eye Contact:  Fair  Speech:  Normal Rate  Volume:  Normal  Mood:  Anxious, Dysphoric and Irritable  Affect:  Congruent  Thought Process:  Goal Directed and Descriptions of Associations: Tangential  Orientation:  Full (Time, Place, and Person)  Thought Content:  Delusions and Tangential  Suicidal Thoughts:  No  Homicidal Thoughts:  No  Memory:  Immediate;   Fair Recent;   Fair Remote;   Fair  Judgement:  Impaired  Insight:  Fair  Psychomotor Activity:  Increased   Concentration:  Concentration: Fair and Attention Span: Fair  Recall:  [employer] of Knowledge:  Fair  Language:  Fair  Akathisia:  Negative  Handed:  Right  AIMS (if indicated):     Assets:  Communication Skills Desire for Improvement Financial Resources/Insurance Housing Physical Health Resilience  ADL's:  Intact  Cognition:  WNL  Sleep:  Number of Hours: 4.25     Treatment Plan Summary: Daily contact with patient to assess and evaluate symptoms and progress in treatment, Medication management and Plan : Patient is seen and examined.  Patient's 43 year old male with the above-stated past psychiatric history who is seen in follow-up.  Patient declined taking the Seroquel dosage this morning because he was oversedated.  I am going to change his Seroquel back to 200 mg p.o. nightly.  We will have a PRN if necessary during the day.  He also continues on the Depakote 1500 mg p.o. twice daily basically.  I am going to try and get a level on his blood that was drawn on admission.  At least at that point we can see whether or not he was compliant with medications.  I have asked him to sign a release of information so that staff can talk with his outpatient psychiatrist (Dr. Betti Cruz).  He stated the biggest issue that he is having is his anger, and if his Depakote level is low it may be because of noncompliance given the hefty dose that he is currently taking. 1.  Change Seroquel to 200 mg p.o. nightly for mood and sleep, a 50 mg dose of Seroquel will be available during the day as needed for irritability. 2.  Continue Depakote 1500 mg p.o. twice daily for mood.  We will attempt to get a Depakote level from the blood drawn on admission. 3.  Continue Cogentin 0.5 mg p.o. twice daily for side effects of Seroquel. 4.  Continue trazodone  200 mg p.o. nightly for sleep. 5.  Disposition planning-in progress.  Antonieta PertGreg Lawson Clary, MD 11/01/2018, 11:59 AM

## 2018-11-01 NOTE — Progress Notes (Signed)
D:Pt's mood continues to be labile and he shows signs of paranoia. Pt refused some of his medications and looked at each pill requesting to see wrappers again before taking his medications.  A:Offered support, encouragement and 15 minute checks. R:Safety maintained on the unit.

## 2018-11-01 NOTE — Progress Notes (Signed)
Pt refused to take morning meds when offered, stating that he wanted to speak with the MD first.

## 2018-11-01 NOTE — B[facility] Group Notes (Signed)
LCSW Group Therapy Note  11/01/2018    11:15am-12:00pm  Type of Therapy and Topic:  Group Therapy: Early Messages Received About Anger  Participation Level:  Active   Description of Group:   In this group, patients shared and discussed the early messages received in their lives about anger through parental or other adult modeling, teaching, repression, punishment, violence, and more.  Participants identified how those childhood lessons influence even now how they usually or often react when angered.  The group discussed that anger is a secondary emotion and what may be the underlying emotional themes that come out through anger outbursts or that are ignored through anger suppression.  Finally, as a group there was a conversation about the workbook's quote that "There is nothing wrong with anger; it is just a sign something needs to change."     Therapeutic Goals: 1. Patients will identify one or more childhood message about anger that they received and how it was taught to them. 2. Patients will discuss how these childhood experiences have influenced and continue to influence their own expression or repression of anger even today. 3. Patients will explore possible primary emotions that tend to fuel their secondary emotion of anger. 4. Patients will learn that anger itself is normal and cannot be eliminated, and that healthier coping skills can assist with resolving conflict rather than worsening situations.  Summary of Patient Progress:  The patient shared that his childhood lessons about anger were given when his mother constantly took him to a psychologist saying he was "sideways with anger."   As a result, he does not know how to handle anger and said that he gets very hurt and tries to take it out on others.  After group, he spent a long time describing a situation that happened at an Alcoholics Anonymous meeting recently where he was told not to return to the facility.  Therapeutic Modalities:    Cognitive Behavioral Therapy Motivation Interviewing  Lynnell Chad  .

## 2018-11-02 NOTE — Progress Notes (Signed)
Patient refused EKG again. He began yelling "You people keep doing things to me that aren't necessary and you don't explain anything." I had explained the procedure earlier this morning, and he was agreeable. I also explained the procedure again, but he frequently interrupted. Patient remains labile and agitated this shift.

## 2018-11-02 NOTE — Progress Notes (Signed)
Patient ID: Albert [name], male   DOB: 08-Feb-1976, 43 y.o.   MRN: [id number] D: Assumed care patient @ 2330. Patient in bed sleeping. Respiration regular and unlabored. No sign of distress noted at this time A: 15 mins checks for safety. R: Patient remains safe.

## 2018-11-02 NOTE — [facility] Group Notes (Signed)
[facility] LCSW Group Therapy Note  Date/Time:  11/02/2018  11:00AM-12:00PM  Type of Therapy and Topic:  Group Therapy:  Music and Mood  Participation Level:  Active   Description of Group: In this process group, members listened to a variety of genres of music and identified that different types of music evoke different responses.  Patients were encouraged to identify music that was soothing for them and music that was energizing for them.  Patients discussed how this knowledge can help with wellness and recovery in various ways including managing depression and anxiety as well as encouraging healthy sleep habits.    Therapeutic Goals: 1. Patients will explore the impact of different varieties of music on mood 2. Patients will verbalize the thoughts they have when listening to different types of music 3. Patients will identify music that is soothing to them as well as music that is energizing to them 4. Patients will discuss how to use this knowledge to assist in maintaining wellness and recovery 5. Patients will explore the use of music as a coping skill  Summary of Patient Progress:  At the beginning of group, patient expressed little.  He did not ask for a specific song to be played as others did, until he was called on directly.  He enjoyed it.  Therapeutic Modalities: Solution Focused Brief Therapy Activity   Ambrose Mantle, LCSW

## 2018-11-02 NOTE — Progress Notes (Signed)
[name] MD Progress Note  11/02/2018 12:04 PM Albert [name]  MRN:  [id number] Subjective:  Patient is seen and examined.  Patient's 43 year old male with a past psychiatric history significant for schizoaffective disorder who was admitted on 10/31/2018 secondary to the fact he became disruptive at an AA meeting, was escorted to the emergency department for evaluation.  He was clearly paranoid, and had some anger and irritability issues.  Was admitted to the hospital for evaluation and stabilization.  Objective: Patient is a 43 year old male with the above-stated past psychiatric history who is seen in follow-up today.  He stated he is feeling better.  He slept better last night.  His irritability seems to have decreased a bit.  He still is focused on the past and some of his delusional thinking.  He did take the 200 mg of Seroquel last night, and was concerned that he was going to get Seroquel during the day.  I told him that was only as needed if he became agitated.  His Depakote level yesterday was 67.  The rest of his laboratories are essentially normal.  His vital signs are stable, he is afebrile.  He slept 6.75 hours last night.  Principal Problem: <principal problem not specified> Diagnosis: Active Problems:   Schizoaffective disorder, bipolar type (HCC)  Total Time spent with patient: 15 minutes  Past Psychiatric History: See admission H&P  Past Medical History:  Past Medical History:  Diagnosis Date  . Alcoholism (HCC)   . Drug abuse (HCC)    xanax addiction 5 years ago  . Medical non-compliance   . Paranoid schizophrenia (HCC)   . Schizophrenia (HCC)   . Seizures (HCC)    History reviewed. No pertinent surgical history. Family History:  Family History  Problem Relation Age of Onset  . Thyroid disease Mother   . Depression Maternal Aunt    Family Psychiatric  History: See admission H&P Social History:  Social History   Substance and Sexual Activity  Alcohol Use Not  Currently     Social History   Substance and Sexual Activity  Drug Use Not Currently  . Types: Marijuana   Comment: xanax- several years ago    Social History   Socioeconomic History  . Marital status: Single    Spouse name: Not on file  . Number of children: Not on file  . Years of education: Not on file  . Highest education level: Not on file  Occupational History  . Not on file  Social Needs  . Financial resource strain: Not on file  . Food insecurity:    Worry: Not on file    Inability: Not on file  . Transportation needs:    Medical: Not on file    Non-medical: Not on file  Tobacco Use  . Smoking status: Never Smoker  . Smokeless tobacco: Never Used  . Tobacco comment: Non-smoker  Substance and Sexual Activity  . Alcohol use: Not Currently  . Drug use: Not Currently    Types: Marijuana    Comment: xanax- several years ago  . Sexual activity: Not Currently  Lifestyle  . Physical activity:    Days per week: Not on file    Minutes per session: Not on file  . Stress: Not on file  Relationships  . Social connections:    Talks on phone: Not on file    Gets together: Not on file    Attends religious service: Not on file    Active member of club or organization: Not  on file    Attends meetings of clubs or organizations: Not on file    Relationship status: Not on file  Other Topics Concern  . Not on file  Social History Narrative   ** Merged History Encounter **       Additional Social History:                         Sleep: Good  Appetite:  Good  Current Medications: Current Facility-Administered Medications  Medication Dose Route Frequency Provider Last Rate Last Dose  . acetaminophen (TYLENOL) tablet 650 mg  650 mg Oral Q6H PRN Laveda AbbeParks, Laurie Britton, NP      . alum & mag hydroxide-simeth (MAALOX/MYLANTA) 200-200-20 MG/5ML suspension 30 mL  30 mL Oral Q4H PRN Laveda AbbeParks, Laurie Britton, NP      . benztropine (COGENTIN) tablet 0.5 mg  0.5 mg Oral  BID Malvin JohnsFarah, Brian, MD   Stopped at 11/01/18 [id number]  . divalproex (DEPAKOTE ER) 24 hr tablet 1,500 mg  1,500 mg Oral Daily Laveda AbbeParks, Laurie Britton, NP   1,500 mg at 11/02/18 0910  . divalproex (DEPAKOTE ER) 24 hr tablet 1,500 mg  1,500 mg Oral QHS Malvin JohnsFarah, Brian, MD   1,500 mg at 11/01/18 2143  . hydrOXYzine (ATARAX/VISTARIL) tablet 25 mg  25 mg Oral TID PRN Laveda AbbeParks, Laurie Britton, NP      . LORazepam (ATIVAN) tablet 1 mg  1 mg Oral Q4H PRN Malvin JohnsFarah, Brian, MD       Or  . LORazepam (ATIVAN) injection 2 mg  2 mg Intramuscular Q4H PRN Malvin JohnsFarah, Brian, MD      . magnesium hydroxide (MILK OF MAGNESIA) suspension 30 mL  30 mL Oral Daily PRN Laveda AbbeParks, Laurie Britton, NP      . QUEtiapine (SEROQUEL) tablet 200 mg  200 mg Oral QHS Antonieta Pertlary, Dangela How Lawson, MD   200 mg at 11/01/18 2143  . QUEtiapine (SEROQUEL) tablet 50 mg  50 mg Oral Q3 days PRN Antonieta Pertlary, Harlo Jaso Lawson, MD      . traZODone (DESYREL) tablet 200 mg  200 mg Oral QHS Malvin JohnsFarah, Brian, MD        Lab Results:  Results for orders placed or performed during the hospital encounter of 10/30/18 (from the past 48 hour(s))  Valproic acid level     Status: None   Collection Time: 11/01/18  6:30 PM  Result Value Ref Range   Valproic Acid Lvl 67 50.0 - 100.0 ug/mL    Comment: Performed at [facility], [address] [place], [place] [id number]    Blood Alcohol level:  Lab Results  Component Value Date   [facility] 10/30/2018   ETH <10 06/04/2018    Metabolic Disorder Labs: Lab Results  Component Value Date   HGBA1C 5.1 01/05/2017   MPG 100 01/05/2017   Lab Results  Component Value Date   PROLACTIN 30.4 (H) 01/06/2017   PROLACTIN 13.1 01/05/2017   Lab Results  Component Value Date   CHOL 172 01/05/2017   TRIG 112 01/05/2017   HDL 41 01/05/2017   CHOLHDL 4.2 01/05/2017   VLDL 22 01/05/2017   LDLCALC 109 (H) 01/05/2017    Physical Findings: AIMS: Facial and Oral Movements Muscles of Facial Expression: None, normal Lips and Perioral Area:  None, normal Jaw: None, normal Tongue: None, normal,Extremity Movements Upper (arms, wrists, hands, fingers): None, normal Lower (legs, knees, ankles, toes): None, normal, Trunk Movements Neck, shoulders, hips: None, normal, Overall Severity Severity of  abnormal movements (highest score from questions above): None, normal Incapacitation due to abnormal movements: None, normal Patient's awareness of abnormal movements (rate only patient's report): No Awareness, Dental Status Current problems with teeth and/or dentures?: No Does patient usually wear dentures?: No  CIWA:  CIWA-Ar Total: 0 COWS:  COWS Total Score: 0  Musculoskeletal: Strength & Muscle Tone: within normal limits Gait & Station: normal Patient leans: N/A  Psychiatric Specialty Exam: Physical Exam  Nursing note reviewed. Constitutional: He is oriented to person, place, and time. He appears well-developed and well-nourished.  HENT:  Head: Normocephalic and atraumatic.  Respiratory: Effort normal.  Neurological: He is alert and oriented to person, place, and time.    ROS  Blood pressure 105/68, pulse 97, temperature 97.8 F (36.6 C), temperature source Oral, resp. rate 18, height 5\' 10"  (1.778 m), weight 99.8 kg, SpO2 98 %.Body mass index is 31.57 kg/m.  General Appearance: Casual  Eye Contact:  Fair  Speech:  Normal Rate  Volume:  Decreased  Mood:  Anxious and Depressed  Affect:  Congruent  Thought Process:  Coherent and Descriptions of Associations: Intact  Orientation:  Full (Time, Place, and Person)  Thought Content:  Delusions  Suicidal Thoughts:  No  Homicidal Thoughts:  No  Memory:  Immediate;   Fair Recent;   Fair Remote;   Fair  Judgement:  Impaired  Insight:  Lacking  Psychomotor Activity:  Normal  Concentration:  Concentration: Fair and Attention Span: Fair  Recall:  [employer] of Knowledge:  Fair  Language:  Fair  Akathisia:  Negative  Handed:  Right  AIMS (if indicated):     Assets:   Communication Skills Desire for Improvement Housing Physical Health Resilience  ADL's:  Intact  Cognition:  WNL  Sleep:  Number of Hours: 6.75     Treatment Plan Summary: Daily contact with patient to assess and evaluate symptoms and progress in treatment, Medication management and Plan : Patient is seen and examined.  Patient's 43 year old male with the above-stated past psychiatric history who is seen in follow-up.  Patient seems a bit better today.  He is less labile, but still has an underlying lesion that I think may be fixed.  His Depakote level is stable with the 1500 mg twice daily.  He slept better with the Seroquel to 200 mg, but he is a little cautious about the 50 mg during the day as needed.  We do need to confirm what his baseline is.  But for now I am not going to change any of his medicines today. 1.  Continue Seroquel 200 mg p.o. nightly for mood and sleep. 2.  Continue 50 mg p.o. daily PRN for irritability. 3.  Continue Depakote 1500 mg p.o. twice daily for mood stability. 4.  Continue Cogentin 0.5 mg p.o. twice daily for side effects of Seroquel. 5.  Continue trazodone 200 mg p.o. nightly for sleep. 6.  We do need some collateral information from his psychiatrist Dr. Betti Cruz. 7.  Disposition planning-in progress.  Antonieta Pert, MD 11/02/2018, 12:04 PM

## 2018-11-02 NOTE — Plan of Care (Signed)
D: Patient presents calm and cooperative. He has some latency of speech, but answers questions appropriately. He slept well last night, and declined medication to help. His appetite is good, energy normal and concentration good. He rates his depression, hopelessness and anxiety 3/10. He denies withdrawal symptoms or physical complaints. Patient denies SI/HI/AVH.  A: Patient checked q15 min, and checks reviewed. Reviewed medication changes with patient and educated on side effects. Educated patient on importance of attending group therapy sessions and educated on several coping skills. Encouarged participation in milieu through recreation therapy and attending meals with peers. Support and encouragement provided. Fluids offered. R: Patient receptive to education on medications, and is medication compliant. Patient contracts for safety on the unit. Goal: "Ask about discharge" and "talk to doctor."

## 2018-11-03 MED ORDER — QUETIAPINE FUMARATE 50 MG PO TABS: 50.0000 mg | ORAL_TABLET | Freq: Three times a day (TID) | ORAL | Status: DC | PRN

## 2018-11-03 MED ORDER — DIVALPROEX SODIUM ER 500 MG PO TB24
500.0000 mg | ORAL_TABLET | Freq: Every day | ORAL | Status: DC
  Administered 2018-11-04 – 2018-11-05 (×2): 500 mg via ORAL
  Filled 2018-11-03 (×4): qty 1

## 2018-11-03 NOTE — Progress Notes (Signed)
Patient ID: Albert [name], male   DOB: 1975/12/03, 43 y.o.   MRN: [id number] Per State regulations 482.30 this chart was reviewed for medical necessity with respect to the patient's admission/duration of stay.    Next review date: 11/07/2018  Thurman Coyer, BSN, RN-BC  Case Manager

## 2018-11-03 NOTE — [facility] Group Notes (Signed)
Adult Psychoeducational Group Note  Date:  11/03/2018 Time:  4:35 PM  Group Topic/Focus:  Wellness Toolbox:   The focus of this group is to discuss various aspects of wellness, balancing those aspects and exploring ways to increase the ability to experience wellness.  Patients will create a wellness toolbox for use upon discharge.  Participation Level:  Minimal  Participation Quality:  Inattentive  Affect:  Resistant  Cognitive:  Delusional  Insight: Lacking  Engagement in Group:  Limited  Modes of Intervention:  Clarification, Education, Problem-solving and Reality Testing  Additional Comments:    Donell Beers 11/03/2018, 4:35 PM

## 2018-11-03 NOTE — Progress Notes (Signed)
Recreation Therapy Notes  Date: 1.6.20 Time: 1000 Location: 500 Hall Dayroom  Group Topic: Coping Skills  Goal Area(s) Addresses:  Pt will be able to identify positive coping skills. Pt will be able to identify benefits of using positive coping skills. Pt will be able to identify benefits of using coping skills post d/c.  Intervention: Worksheet  Activity: Coping Skills A to Z.  Patients worked together to Licensed conveyancer for each letter of the alphabet.   Education: Pharmacologist, Building control surveyor.   Education Outcome: Acknowledges understanding/In group clarification offered/Needs additional education.   Clinical Observations/Feedback: Pt did not attend group.    [name], LRT/CTRS      [name] A 11/03/2018 11:43 AM

## 2018-11-03 NOTE — Plan of Care (Signed)
  Problem: Education: Goal: Ability to make informed decisions regarding treatment will improve Outcome: Progressing   Problem: Coping: Goal: Coping ability will improve Outcome: Progressing   Problem: Health Behavior/Discharge Planning: Goal: Identification of resources available to assist in meeting health care needs will improve Outcome: Progressing   Problem: Medication: Goal: Compliance with prescribed medication regimen will improve Outcome: Progressing   

## 2018-11-03 NOTE — Progress Notes (Signed)
Patient ID: Albert [name], male   DOB: 27-Jun-1976, 43 y.o.   MRN: [id number] D: Patient observed sitting in dayroom watching TV on approach. Pt presents with paranoid and preoccupied affect and behavior. Pt made statement to writer that is off the wall. Pt attended evening wrap up group and Interacted appropriately with peers. Denies  SI/HI/AVH and pain.No behavioral issues noted.  A: Support and encouragement offered as needed to express needs. Medications administered as prescribed.  R: Patient is safe and cooperative on unit. Will continue to monitor  for safety and stability.

## 2018-11-03 NOTE — Progress Notes (Signed)
[facility]  11/03/2018 11:54 AM Albert [name]  MRN:  [id number] Subjective:   Patient seen he is very specific about his medications and resist certain changes the Depakote level was escalated but I think we can get by with lowering the morning dose based on his level.  He is having some heartburn from it.  At any rate he is somewhat paranoid he believes the staff is rude to him and disrespectful and that is why "he is irritable" when he is asked about this.  He states that in the past that he is called management on the staff and he believes they talk him directly about him.  He refused to see this is a form of paranoia but at any rate he is open to the discussion.  No EPS or TD we can discontinue Cogentin does not usually necessary with Seroquel again oriented to person place situation and still not fully ready to go but may be in 1 or 2 days Principal Problem: Masturbation of bipolar disorder Diagnosis: Active Problems:   Schizoaffective disorder, bipolar type (HCC)  Total Time spent with patient: 20 minutes  Past Medical History:  Past Medical History:  Diagnosis Date  . Alcoholism (HCC)   . Drug abuse (HCC)    xanax addiction 5 years ago  . Medical non-compliance   . Paranoid schizophrenia (HCC)   . Schizophrenia (HCC)   . Seizures (HCC)    History reviewed. No pertinent surgical history. Family History:  Family History  Problem Relation Age of Onset  . Thyroid disease Mother   . Depression Maternal Aunt     Social History:  Social History   Substance and Sexual Activity  Alcohol Use Not Currently     Social History   Substance and Sexual Activity  Drug Use Not Currently  . Types: Marijuana   Comment: xanax- several years ago    Social History   Socioeconomic History  . Marital status: Single    Spouse name: Not on file  . Number of children: Not on file  . Years of education: Not on file  . Highest education level: Not on file  Occupational  History  . Not on file  Social Needs  . Financial resource strain: Not on file  . Food insecurity:    Worry: Not on file    Inability: Not on file  . Transportation needs:    Medical: Not on file    Non-medical: Not on file  Tobacco Use  . Smoking status: Never Smoker  . Smokeless tobacco: Never Used  . Tobacco comment: Non-smoker  Substance and Sexual Activity  . Alcohol use: Not Currently  . Drug use: Not Currently    Types: Marijuana    Comment: xanax- several years ago  . Sexual activity: Not Currently  Lifestyle  . Physical activity:    Days per week: Not on file    Minutes per session: Not on file  . Stress: Not on file  Relationships  . Social connections:    Talks on phone: Not on file    Gets together: Not on file    Attends religious service: Not on file    Active member of club or organization: Not on file    Attends meetings of clubs or organizations: Not on file    Relationship status: Not on file  Other Topics Concern  . Not on file  Social History Narrative   ** Merged History Encounter **  Additional Social History:                         Sleep: Good  Appetite:  Good  Current Medications: Current Facility-Administered Medications  Medication Dose Route Frequency Provider Last Rate Last Dose  . acetaminophen (TYLENOL) tablet 650 mg  650 mg Oral Q6H PRN Laveda AbbeParks, Laurie Britton, NP      . alum & mag hydroxide-simeth (MAALOX/MYLANTA) 200-200-20 MG/5ML suspension 30 mL  30 mL Oral Q4H PRN Laveda AbbeParks, Laurie Britton, NP      . divalproex (DEPAKOTE ER) 24 hr tablet 1,500 mg  1,500 mg Oral QHS Malvin JohnsFarah, Damyen Knoll, MD   1,500 mg at 11/02/18 2146  . [START ON 11/04/2018] divalproex (DEPAKOTE ER) 24 hr tablet 500 mg  500 mg Oral Daily Malvin JohnsFarah, Deiondra Denley, MD      . hydrOXYzine (ATARAX/VISTARIL) tablet 25 mg  25 mg Oral TID PRN Laveda AbbeParks, Laurie Britton, NP      . LORazepam (ATIVAN) tablet 1 mg  1 mg Oral Q4H PRN Malvin JohnsFarah, Leonetta Mcgivern, MD       Or  . LORazepam (ATIVAN)  injection 2 mg  2 mg Intramuscular Q4H PRN Malvin JohnsFarah, Taji Sather, MD      . magnesium hydroxide (MILK OF MAGNESIA) suspension 30 mL  30 mL Oral Daily PRN Laveda AbbeParks, Laurie Britton, NP      . QUEtiapine (SEROQUEL) tablet 200 mg  200 mg Oral QHS Antonieta Pertlary, Greg Lawson, MD   200 mg at 11/02/18 2146  . QUEtiapine (SEROQUEL) tablet 50 mg  50 mg Oral TID PRN Malvin JohnsFarah, Kyi Romanello, MD      . traZODone (DESYREL) tablet 200 mg  200 mg Oral QHS Malvin JohnsFarah, Mel Langan, MD        Lab Results:  Results for orders placed or performed during the hospital encounter of 10/30/18 (from the past 48 hour(s))  Valproic acid level     Status: None   Collection Time: 11/01/18  6:30 PM  Result Value Ref Range   Valproic Acid Lvl 67 50.0 - 100.0 ug/mL    Comment: Performed at [facility], [address] [place], [place] [address]    Blood Alcohol level:  Lab Results  Component Value Date   [facility] 10[facility]   ETH <10 06/04/2018    Metabolic Disorder Labs: Lab Results  Component Value Date   HGBA1C 5.1 01/05/2017   MPG 100 01/05/2017   Lab Results  Component Value Date   PROLACTIN 30.4 (H) 01/06/2017   PROLACTIN 13.1 01/05/2017   Lab Results  Component Value Date   CHOL 172 01/05/2017   TRIG 112 01/05/2017   HDL 41 01/05/2017   CHOLHDL 4.2 01/05/2017   VLDL 22 01/05/2017   LDLCALC 109 (H) 01/05/2017    Physical Findings: AIMS: Facial and Oral Movements Muscles of Facial Expression: None, normal Lips and Perioral Area: None, normal Jaw: None, normal Tongue: None, normal,Extremity Movements Upper (arms, wrists, hands, fingers): None, normal Lower (legs, knees, ankles, toes): None, normal, Trunk Movements Neck, shoulders, hips: None, normal, Overall Severity Severity of abnormal movements (highest score from questions above): None, normal Incapacitation due to abnormal movements: None, normal Patient's awareness of abnormal movements (rate only patient's report): No Awareness, Dental Status Current  problems with teeth and/or dentures?: No Does patient usually wear dentures?: No  CIWA:  CIWA-Ar Total: 0 COWS:  COWS Total Score: 0  Musculoskeletal: Strength & Muscle Tone: within normal limits Gait & Station: normal Patient leans: N/A  Psychiatric  Specialty Exam: Physical Exam  ROS  Blood pressure 133/89, pulse (!) 101, temperature (!) 97.5 F (36.4 C), temperature source Oral, resp. rate 18, height 5\' 10"  (1.778 m), weight 99.8 kg, SpO2 98 %.Body mass index is 31.57 kg/m.  General Appearance: Casual  Eye Contact:  Good  Speech:  Clear and Coherent  Volume:  Decreased  Mood:  Euthymic  Affect:  Constricted  Thought Process:  Goal Directed  Orientation:  Other:  In place state a time situation  Thought Content:  Delusions  Suicidal Thoughts:  No  Homicidal Thoughts:  No  Memory:  Immediate;   Fair  Judgement:  Fair  Insight:  Fair  Psychomotor Activity:  Decreased  Concentration:  Concentration: Good  Recall:  Good  Fund of Knowledge:  Good  Language:  Good  Akathisia:  Negative  Handed:  Right  AIMS (if indicated):     Assets:  Physical Health  ADL's:  Intact  Cognition:  WNL  Sleep:  Number of Hours: 6.25     Treatment Plan Summary: Daily contact with patient to assess and evaluate symptoms and progress in treatment, Medication management and Plan Continue reality based therapies, discussed medications, hopefully will comply fully with the current regimen at home it can work for him is just not the most optimal regimen.  Discussed in detail continue current precautions  Albert Busey, MD 11/03/2018, 11:54 AM

## 2018-11-04 NOTE — Progress Notes (Signed)
Dar Note: Patient is in bed sleeping with respiration regular and unlabored.  Routine safety checks maintained every 15 minutes.  Patient is safe on the unit. 

## 2018-11-04 NOTE — Progress Notes (Signed)
Patient denies SI, HI and AVH.  Patient has been noted to be easily irritated and angry toward staff. Patient was able to calm without intervention but still continues to exhibit and irritable edge.   Assess patient for safety, offer medications as prescribed, engage patient in 1:1 staff talks.   Continue to monitor as planned.  Patient able to maintain safety.

## 2018-11-04 NOTE — Progress Notes (Signed)
Recreation Therapy Notes  Date: 1.7.20 Time: 1000 Location: 500 Hall Dayroom  Group Topic: Wellness  Goal Area(s) Addresses:  Patient will define components of whole wellness. Patient will verbalize benefit of whole wellness.  Intervention: Exercise  Activity: LRT led group in a series of stretches to loosen up the muscles.  Each patient then got the opportunity to lead the group in an exercise of their choice.  Patients could take breaks and get water as needed.  Education: Wellness, Building control surveyor.   Education Outcome: Acknowledges education/In group clarification offered/Needs additional education.   Clinical Observations/Feedback: Pt did not attend group.    [name], LRT/CTRS         [name] A 11/04/2018 10:49 AM

## 2018-11-04 NOTE — Progress Notes (Signed)
Nursing Progress Note: 7p-7a D: Pt currently presents with a depressed/anxious/magical thinking affect and behavior. Pt states "I think I am on too many pills. It is so unnecessary." Interacting appropriately with the milieu. Pt reports fair sleep during the previous night with current medication regimen. Pt did attend wrap-up group.  A: Pt provided with medications per providers orders. Pt's labs and vitals were monitored throughout the night. Pt supported emotionally and encouraged to express concerns and questions. Pt educated on medications.  R: Pt's safety ensured with 15 minute and environmental checks. Pt currently denies SI, HI, and AVH. Pt verbally contracts to seek staff if SI,HI, or AVH occurs and to consult with staff before acting on any harmful thoughts. Will continue to monitor.

## 2018-11-04 NOTE — Progress Notes (Signed)
[facility]  11/04/2018 10:32 AM [name]  MRN:  [id number] Subjective:   Albert [name] is less guarded and paranoid he speaks with the PA student today as was myself but this makes him less guarded and paranoid towards staff he is pleased with his current meds and desires no changes will probably go tomorrow despite other discussions he believes that these are the correct meds for him  Principal Problem: Bipolar manic Diagnosis: Active Problems:   Schizoaffective disorder, bipolar type (HCC)  Total Time spent with patient: 20 minutes  Past Medical History:  Past Medical History:  Diagnosis Date  . Alcoholism (HCC)   . Drug abuse (HCC)    xanax addiction 5 years ago  . Medical non-compliance   . Paranoid schizophrenia (HCC)   . Schizophrenia (HCC)   . Seizures (HCC)    History reviewed. No pertinent surgical history. Family History:  Family History  Problem Relation Age of Onset  . Thyroid disease Mother   . Depression Maternal Aunt    Social History:  Social History   Substance and Sexual Activity  Alcohol Use Not Currently     Social History   Substance and Sexual Activity  Drug Use Not Currently  . Types: Marijuana   Comment: xanax- several years ago    Social History   Socioeconomic History  . Marital status: Single    Spouse name: Not on file  . Number of children: Not on file  . Years of education: Not on file  . Highest education level: Not on file  Occupational History  . Not on file  Social Needs  . Financial resource strain: Not on file  . Food insecurity:    Worry: Not on file    Inability: Not on file  . Transportation needs:    Medical: Not on file    Non-medical: Not on file  Tobacco Use  . Smoking status: Never Smoker  . Smokeless tobacco: Never Used  . Tobacco comment: Non-smoker  Substance and Sexual Activity  . Alcohol use: Not Currently  . Drug use: Not Currently    Types: Marijuana    Comment: xanax- several  years ago  . Sexual activity: Not Currently  Lifestyle  . Physical activity:    Days per week: Not on file    Minutes per session: Not on file  . Stress: Not on file  Relationships  . Social connections:    Talks on phone: Not on file    Gets together: Not on file    Attends religious service: Not on file    Active member of club or organization: Not on file    Attends meetings of clubs or organizations: Not on file    Relationship status: Not on file  Other Topics Concern  . Not on file  Social History Narrative   ** Merged History Encounter **       Sleep: Good  Appetite:  Good  Current Medications: Current Facility-Administered Medications  Medication Dose Route Frequency Provider Last Rate Last Dose  . acetaminophen (TYLENOL) tablet 650 mg  650 mg Oral Q6H PRN Laveda AbbeParks, Laurie Britton, NP      . alum & mag hydroxide-simeth (MAALOX/MYLANTA) 200-200-20 MG/5ML suspension 30 mL  30 mL Oral Q4H PRN Laveda AbbeParks, Laurie Britton, NP      . divalproex (DEPAKOTE ER) 24 hr tablet 1,500 mg  1,500 mg Oral QHS Malvin JohnsFarah, Marieann Zipp, MD   1,500 mg at 11/02/18 2146  . divalproex (DEPAKOTE ER) 24 hr  tablet 500 mg  500 mg Oral Daily Malvin JohnsFarah, Chrisy Hillebrand, MD   500 mg at 11/04/18 0732  . hydrOXYzine (ATARAX/VISTARIL) tablet 25 mg  25 mg Oral TID PRN Laveda AbbeParks, Laurie Britton, NP      . LORazepam (ATIVAN) tablet 1 mg  1 mg Oral Q4H PRN Malvin JohnsFarah, Jae Bruck, MD       Or  . LORazepam (ATIVAN) injection 2 mg  2 mg Intramuscular Q4H PRN Malvin JohnsFarah, Sheridan Hew, MD      . magnesium hydroxide (MILK OF MAGNESIA) suspension 30 mL  30 mL Oral Daily PRN Laveda AbbeParks, Laurie Britton, NP      . QUEtiapine (SEROQUEL) tablet 200 mg  200 mg Oral QHS Antonieta Pertlary, Greg Lawson, MD   200 mg at 11/03/18 2232  . QUEtiapine (SEROQUEL) tablet 50 mg  50 mg Oral TID PRN Malvin JohnsFarah, Yitzhak Awan, MD      . traZODone (DESYREL) tablet 200 mg  200 mg Oral QHS Malvin JohnsFarah, Larnell Granlund, MD        Lab Results: No results found for this or any previous visit (from the past 48 hour(s)).  Blood Alcohol  level:  Lab Results  Component Value Date   ETH <10 10/30/2018   ETH <10 06/04/2018    Metabolic Disorder Labs: Lab Results  Component Value Date   HGBA1C 5.1 01/05/2017   MPG 100 01/05/2017   Lab Results  Component Value Date   PROLACTIN 30.4 (H) 01/06/2017   PROLACTIN 13.1 01/05/2017   Lab Results  Component Value Date   CHOL 172 01/05/2017   TRIG 112 01/05/2017   HDL 41 01/05/2017   CHOLHDL 4.2 01/05/2017   VLDL 22 01/05/2017   LDLCALC 109 (H) 01/05/2017    Physical Findings: AIMS: Facial and Oral Movements Muscles of Facial Expression: None, normal Lips and Perioral Area: None, normal Jaw: None, normal Tongue: None, normal,Extremity Movements Upper (arms, wrists, hands, fingers): None, normal Lower (legs, knees, ankles, toes): None, normal, Trunk Movements Neck, shoulders, hips: None, normal, Overall Severity Severity of abnormal movements (highest score from questions above): None, normal Incapacitation due to abnormal movements: None, normal Patient's awareness of abnormal movements (rate only patient's report): No Awareness, Dental Status Current problems with teeth and/or dentures?: No Does patient usually wear dentures?: No  CIWA:  CIWA-Ar Total: 0 COWS:  COWS Total Score: 0  Musculoskeletal: Strength & Muscle Tone: within normal limits Gait & Station: normal Patient leans: N/A  Psychiatric Specialty Exam: Physical Exam  ROS  Blood pressure 115/62, pulse (!) 53, temperature 97.7 F (36.5 C), temperature source Oral, resp. rate 18, height 5\' 10"  (1.778 m), weight 99.8 kg, SpO2 98 %.Body mass index is 31.57 kg/m.  General Appearance: Casual  Eye Contact:  Absent  Speech:  Clear and Coherent  Volume:  Normal  Mood:  Euthymic  Affect:  Appropriate  Thought Process:  Goal Directed  Orientation:  Full (Time, Place, and Person)  Thought Content:  Logical  Suicidal Thoughts:  No  Homicidal Thoughts:  No  Memory:  Immediate;   Good  Judgement:  Good   Insight:  Good  Psychomotor Activity:  Normal  Concentration:  Concentration: Good  Recall:  Good  Fund of Knowledge:  Good  Language:  Good  Akathisia:  Negative  Handed:  Right  AIMS (if indicated):     Assets:  Communication Skills Desire for Improvement  ADL's:  Intact  Cognition:  WNL  Sleep:  Number of Hours: 6.25     Treatment Plan Summary: Daily contact with patient  to assess and evaluate symptoms and progress in treatment, Medication management and Plan Continue current quetiapine and mood stabilizer therapy for bipolar continue reality based therapy probable discharge tomorrow  Malvin Johns, MD 11/04/2018, 10:32 AM

## 2018-11-05 MED ORDER — DIVALPROEX SODIUM ER 500 MG PO TB24: ORAL_TABLET | ORAL | 2 refills | Status: DC

## 2018-11-05 MED ORDER — QUETIAPINE FUMARATE 200 MG PO TABS: 200.0000 mg | ORAL_TABLET | Freq: Every day | ORAL | 2 refills | Status: DC

## 2018-11-05 MED ORDER — TRAZODONE HCL 100 MG PO TABS: 200.0000 mg | ORAL_TABLET | Freq: Every evening | ORAL | 2 refills | Status: DC | PRN

## 2018-11-05 MED ORDER — QUETIAPINE FUMARATE 50 MG PO TABS: 50.0000 mg | ORAL_TABLET | Freq: Three times a day (TID) | ORAL | 2 refills | Status: DC | PRN

## 2018-11-05 NOTE — Discharge Summary (Signed)
Physician Discharge Summary Note  Patient:  Albert [name] is an 43 y.o., male MRN:  [id number] DOB:  February 12, 1976 Patient phone:  [phone] (home)  Patient address:   [address] [place] [address],  Total Time spent with patient: 30 minutes  Date of Admission:  10/30/2018 Date of Discharge: 11/05/18  Reason for Admission:    Albert [name] is a 43 year old individual who is been diagnosed with either a schizophrenic or schizoaffective condition in the past however he acknowledges he has been diagnosed with a bipolar disorder, at any rate he has had manic episodes by his report that he cannot describe, he has a history of substance abuse but states he is been free from alcohol and all drugs for 2-1/2 years, but the bottom line is he was disruptive at an AA meeting, and was escorted here to the emergency department for further evaluation. On my evaluation patient states that he just announced there was negative energy at the AA meeting and as result 1 of the members there asked if he was "on his medication" he felt insulted by but states he did not threaten anyone.  According to petition papers Albert [name] is been off of his medications for at least 6 months he normally takes Depakote and Seroquel.  He is also had vague complaints about "punishing people" which he denies and also has possibly been hallucinating but again he denies this as well.  He is guarded on my exam and denies or minimizes all symptoms discussed on the petition or in the context of his HPI. He does however acknowledge he should be back on medications.  According to our assessment team  Albert [name] an 43 y.o.malewho presents to the ED under IVC initiated by a friend.Per IVC "respondent stop taking his meds about 6 months ago. He hears voices especially a voice name "Aurther Loft" his spiritual advisor. He talks about punishing people. Tonight at the 12 step program he had to be removed for  threaten people there."  Pt appears paranoid during the assessment and frequently asks this Clinical research associate to identify self even though that was done several times. Pt states "someone called the cops on me" at the AA meeting this evening. Pt states he felt like the energy was off and the vibes were negative. Pt states he saw someone in the meeting with something on her phone and it was suspicious to him so he "cornered her" to question her about it. Pt states he was asked if he is taking his medications during the AA meeting and he states this question made him angry because he does not want to share the fact that he has to take medications to strangers.   Pt is tangential during the assessment. 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 Alcoholism (HCC)   . Drug abuse (HCC)    xanax addiction 5 years ago  . Medical non-compliance   . Paranoid schizophrenia (HCC)   . Schizophrenia (HCC)   .  Seizures (HCC)    History reviewed. No pertinent surgical history. Family History:  Family History  Problem Relation Age of Onset  . Thyroid disease Mother   . Depression Maternal Aunt     Social History:  Social History   Substance and Sexual Activity  Alcohol Use Not Currently     Social History   Substance and Sexual Activity  Drug Use Not Currently  . Types: Marijuana   Comment: xanax- several years ago    Social History   Socioeconomic History  . Marital status: Single    Spouse name: Not on file  . Number of children: Not on file   . Years of education: Not on file  . Highest education level: Not on file  Occupational History  . Not on file  Social Needs  . Financial resource strain: Not on file  . Food insecurity:    Worry: Not on file    Inability: Not on file  . Transportation needs:    Medical: Not on file    Non-medical: Not on file  Tobacco Use  . Smoking status: Never Smoker  . Smokeless tobacco: Never Used  . Tobacco comment: Non-smoker  Substance and Sexual Activity  . Alcohol use: Not Currently  . Drug use: Not Currently    Types: Marijuana    Comment: xanax- several years ago  . Sexual activity: Not Currently  Lifestyle  . Physical activity:    Days per week: Not on file    Minutes per session: Not on file  . Stress: Not on file  Relationships  . Social connections:    Talks on phone: Not on file    Gets together: Not on file    Attends religious service: Not on file    Active member of club or organization: Not on file    Attends meetings of clubs or organizations: Not on file    Relationship status: Not on file  Other Topics Concern  . Not on file  Social History Narrative   ** Merged History Encounter **        Hospital Course:   Albert [name] was generally compliant and cooperative while here however he always resisted any medication changes.  His thyroid studies showed some minor abnormalities probably due to stress but T4 was normal, further we discussed that perhaps the quetiapine and Depakote was not the right combination for him, even though his Depakote level was therapeutic at 67, but he adamantly refused any changes in medication insisting this was the best regimen for him.  While here he displayed no dangerous behaviors was cooperative and compliant at times irritable and even paranoid toward staff but that dissipated by the time of discharge.  By the date of the eighth he was alert oriented to person place time situation requesting discharge no manic symptoms no racing thoughts no  paranoia no psychosis no thoughts of harming self or others contracting fully, no involuntary movements no EPS or TD  Physical Findings: AIMS: Facial and Oral Movements Muscles of Facial Expression: None, normal Lips and Perioral Area: None, normal Jaw: None, normal Tongue: None, normal,Extremity Movements Upper (arms, wrists, hands, fingers): None, normal Lower (legs, knees, ankles, toes): None, normal, Trunk Movements Neck, shoulders, hips: None, normal, Overall Severity Severity of abnormal movements (highest score from questions above): None, normal Incapacitation due to abnormal movements: None, normal Patient's awareness of abnormal movements (rate only patient's report): No Awareness, Dental Status Current problems with teeth and/or dentures?: No Does patient usually  wear dentures?: No  CIWA:  CIWA-Ar Total: 0 COWS:  COWS Total Score: 0  Musculoskeletal: Strength & Muscle Tone: within normal limits Gait & Station: normal Psychiatric Specialty Exam: Physical Exam bradycardia therefore discontinue beta-blocker  ROS was noncompliant at home with beta-blocker/no involuntary movements  Blood pressure 115/67, pulse (!) 48, temperature 98 F (36.7 C), temperature source Oral, resp. rate 18, height 5\' 10"  (1.778 m), weight 99.8 kg, SpO2 98 %.Body mass index is 31.57 kg/m.  General Appearance: Casual  Eye Contact:  Good  Speech:  Clear and Coherent  Volume:  Decreased  Mood:  Euthymic  Affect:  Congruent  Thought Process:  Goal Directed  Orientation:  Full (Time, Place, and Person)  Thought Content:  Logical  Suicidal Thoughts:  No  Homicidal Thoughts:  No  Memory:  Immediate;   Good  Judgement:  Good  Insight:  Good  Psychomotor Activity:  Normal  Concentration:  Concentration: Good  Recall:  Good  Fund of Knowledge:  Good  Language:  Good  Akathisia:  Negative  Handed:  Right  AIMS (if indicated):     Assets:  Physical Health  ADL's:  Intact  Cognition:  WNL   Sleep:  Number of Hours: 6.25     Have you used any form of tobacco in the last 30 days? (Cigarettes, Smokeless Tobacco, Cigars, and/or Pipes): No  Has this patient used any form of tobacco in the last 30 days? (Cigarettes, Smokeless Tobacco, Cigars, and/or Pipes) Yes, No  Blood Alcohol level:  Lab Results  Component Value Date   ETH <10 10/30/2018   ETH <10 06/04/2018    Metabolic Disorder Labs:  Lab Results  Component Value Date   HGBA1C 5.1 01/05/2017   MPG 100 01/05/2017   Lab Results  Component Value Date   PROLACTIN 30.4 (H) 01/06/2017   PROLACTIN 13.1 01/05/2017   Lab Results  Component Value Date   CHOL 172 01/05/2017   TRIG 112 01/05/2017   HDL 41 01/05/2017   CHOLHDL 4.2 01/05/2017   VLDL 22 01/05/2017   LDLCALC 109 (H) 01/05/2017    See Psychiatric Specialty Exam and Suicide Risk Assessment completed by Attending Physician prior to discharge.  Discharge destination:  Home  Is patient on multiple antipsychotic therapies at discharge:  No   Has Patient had three or more failed trials of antipsychotic monotherapy by history:  No  Recommended Plan for Multiple Antipsychotic Therapies: NA   Allergies as of 11/05/2018   No Known Allergies     Medication List    STOP taking these medications   propranolol 10 MG tablet Commonly known as:  INDERAL     TAKE these medications     Indication  divalproex 500 MG 24 hr tablet Commonly known as:  DEPAKOTE ER 1 in am 3 at h s What changed:    how much to take  how to take this  when to take this  additional instructions  Indication:  MIXED BIPOLAR AFFECTIVE DISORDER   gabapentin 300 MG capsule Commonly known as:  NEURONTIN Take 1 capsule (300 mg total) by mouth 3 (three) times daily at 8am, 3pm and bedtime.  Indication:  Agitation, Neuropathic Pain   omeprazole 40 MG capsule Commonly known as:  PRILOSEC Take 40 mg by mouth daily.  Indication:  Gastroesophageal Reflux Disease   pantoprazole  40 MG tablet Commonly known as:  PROTONIX Take 1 tablet (40 mg total) by mouth 2 (two) times daily before a meal.  Indication:  Excess Stomach Secretions   QUEtiapine 200 MG tablet Commonly known as:  SEROQUEL Take 1 tablet (200 mg total) by mouth at bedtime. What changed:    Another medication with the same name was changed. Make sure you understand how and when to take each.  Another medication with the same name was removed. Continue taking this medication, and follow the directions you see here.  Indication:  Depressive Phase of Manic-Depression   QUEtiapine 50 MG tablet Commonly known as:  SEROQUEL Take 1 tablet (50 mg total) by mouth 3 (three) times daily as needed (agitation). What changed:    medication strength  how much to take  when to take this  reasons to take this  Another medication with the same name was removed. Continue taking this medication, and follow the directions you see here.  Indication:  Depressive Phase of Manic-Depression   traZODone 100 MG tablet Commonly known as:  DESYREL Take 2 tablets (200 mg total) by mouth at bedtime as needed for sleep.  Indication:  Trouble Sleeping      Follow-up Information    [facility]. Go on 11/11/2018.   Specialty:  Behavioral Health Why:  Please attend your appointment with Dr.Reddy on Tuesday, 11/11/18 at 3:20p.  Contact information: [address] [place] [address] [phone]           Follow-up recommendations:  Activity:  full  Comments: Stable for release  SignedMalvin Johns: Taitum Alms, MD 11/05/2018, 9:29 AM

## 2018-11-05 NOTE — Progress Notes (Signed)
Recreation Therapy Notes  Date: 1.8.20 Time: 1000 Location: 500 Hall Dayroom  Group Topic: Goal Setting  Goal Area(s) Addresses:  Patient will be able to identify at least 3 life goals.  Patient will be able to identify obstacles to reaching goals.  Patient will be able to identify benefit of setting life goals post d/c.   Behavioral Response:  Engaged  Intervention: Worksheet  Activity: Garment/textile technologist.  Patients were to set goals they want to accomplish within the next week, month, year and five years.  Patients were to also identify obstacles that would hinder reaching goals, what they need to reach goals and what they could start doing today towards their goals.  Education:  Discharge Planning, Coping Skills, Goal Planning   Education Outcome: Acknowledges Education/In Group Clarification Provided/Needs Additional Education  Clinical Observations:  Pt expressed he wanted to see his psychiatrist within the next week.  Pt also stated he wanted to get back to going to AA meetings and seek out [name] new therapist.  Pt was unable to come up with goals for [name] year and five years.  Pt stated his obstacles would be getting along with people he doesn't agree with.  Pt expressed he needs to "drive forward" in order to be successful and he can start today by going to [name] meeting once he is discharged.   Albert [name], LRT/CTRS     Albert [name], Albert [name] 11/05/2018 11:11 AM

## 2018-11-05 NOTE — Therapy (Signed)
Occupational Therapy Group Note  Date:  11/05/2018 Time:  11:54 AM  Group Topic/Focus:  Stress Management  Participation Level:  Minimal  Participation Quality:  Resistant and Irritable  Affect:  Flat  Cognitive:  Alert  Insight: Limited  Engagement in Group:  Resistant  Modes of Intervention:  Activity, Discussion, Education and Socialization  Additional Comments:    S: "You should right down to ignore everyone at [facility] hospital because they're all so annoying and bad at teaching"  O: Education given on stress management and healthy coping mechanisms. Pt encouraged to brainstorm with other peers and discuss what has worked in the past vs what has not. Pts further encouraged to discuss new coping stress management strategies to implement this date. Art activity made to display preferred coping mechanisms, along with incorporating the stress management outlet of coloring/art.  A: Pt presents to group with flat affect, extremely irritable throughout group. During brainstorm pt states "you can ignore annoying people, like the people that come in and teach these classes". The escalates to say "you should write down ignore then you're name", and demanding so when OT did not write this down. OT then invited pt to leave group if he cannot comply, he states "I'd rather sit here and be cold". Pt then sat reading magazine, completed 75% of craft appropriately without irritability or comments. When OT offered cues to finish remainder of craft, pt threw craft at OT and said "well I don't get why I am doing this I will never look at it anyways". Pt sat and did not engage throughout remainder of activity.  P: Pt provided with education on stress management activities to implement into daily routine. Handouts given to facilitate carryover when reintegrating into community    Dalphine Handing, MSOT, OTR/L [employer] OT/ Acute Relief OT PHP Office: [phone]  Dalphine Handing 11/05/2018, 11:54  AM

## 2018-11-05 NOTE — BHH Suicide Risk Assessment (Signed)
[facility] Discharge Suicide Risk Assessment   Principal Problem: Exacerbation of underlying bipolar type condition/threats towards others at AA meeting Discharge Diagnoses: Active Problems:   Schizoaffective disorder, bipolar type (HCC)   Total Time spent with patient: 45 minutes Currently is alert oriented to person place time and situation somewhat ruminative but without thoughts of self-harm mood is very contained no mania no racing thoughts no psychosis  Mental Status Per Nursing Assessment::   On Admission:  Thoughts of violence towards others  Demographic Factors:  Male  Loss Factors: Decrease in vocational status  Historical Factors: Impulsivity  Risk Reduction Factors:   Religious beliefs about death  Continued Clinical Symptoms:  Bipolar Disorder:   Depressive phase  Cognitive Features That Contribute To Risk:  Closed-mindedness    Suicide Risk:  Minimal: No identifiable suicidal ideation.  Patients presenting with no risk factors but with morbid ruminations; may be classified as minimal risk based on the severity of the depressive symptoms  Follow-up Information    [facility]. Go on 11/11/2018.   Specialty:  Behavioral Health Why:  Please attend your appointment with Dr.Reddy on Tuesday, 11/11/18 at 3:20p.  Contact information: [address] [place] [address] [phone]           Plan Of Care/Follow-up recommendations:  Activity:  full  Gertrue Willette, MD 11/05/2018, 9:22 AM

## 2018-11-05 NOTE — Tx Team (Signed)
Interdisciplinary Treatment and Diagnostic Plan Update  11/05/2018 Time of Session: 9:00am Eyvonne LeftChristopher [place] MRN: [id number]  Principal Diagnosis: <principal problem not specified>  Secondary Diagnoses: Active Problems:   Schizoaffective disorder, bipolar type (HCC)   Current Medications:  Current Facility-Administered Medications  Medication Dose Route Frequency Provider Last Rate Last Dose  . acetaminophen (TYLENOL) tablet 650 mg  650 mg Oral Q6H PRN Laveda AbbeParks, Laurie Britton, NP      . alum & mag hydroxide-simeth (MAALOX/MYLANTA) 200-200-20 MG/5ML suspension 30 mL  30 mL Oral Q4H PRN Laveda AbbeParks, Laurie Britton, NP      . divalproex (DEPAKOTE ER) 24 hr tablet 1,500 mg  1,500 mg Oral QHS Malvin JohnsFarah, Brian, MD   1,500 mg at 11/04/18 2142  . divalproex (DEPAKOTE ER) 24 hr tablet 500 mg  500 mg Oral Daily Malvin JohnsFarah, Brian, MD   500 mg at 11/05/18 0748  . hydrOXYzine (ATARAX/VISTARIL) tablet 25 mg  25 mg Oral TID PRN Laveda AbbeParks, Laurie Britton, NP      . LORazepam (ATIVAN) tablet 1 mg  1 mg Oral Q4H PRN Malvin JohnsFarah, Brian, MD       Or  . LORazepam (ATIVAN) injection 2 mg  2 mg Intramuscular Q4H PRN Malvin JohnsFarah, Brian, MD      . magnesium hydroxide (MILK OF MAGNESIA) suspension 30 mL  30 mL Oral Daily PRN Laveda AbbeParks, Laurie Britton, NP      . QUEtiapine (SEROQUEL) tablet 200 mg  200 mg Oral QHS Antonieta Pertlary, Greg Lawson, MD   200 mg at 11/04/18 2142  . QUEtiapine (SEROQUEL) tablet 50 mg  50 mg Oral TID PRN Malvin JohnsFarah, Brian, MD      . traZODone (DESYREL) tablet 200 mg  200 mg Oral QHS Malvin JohnsFarah, Brian, MD       PTA Medications: Medications Prior to Admission  Medication Sig Dispense Refill Last Dose  . divalproex (DEPAKOTE ER) 500 MG 24 hr tablet Take 1,500 mg by mouth at bedtime.   0 Not Taking at Unknown time  . gabapentin (NEURONTIN) 300 MG capsule Take 1 capsule (300 mg total) by mouth 3 (three) times daily at 8am, 3pm and bedtime. (Patient not taking: Reported on 11/21/2017) 30 capsule 0 Not Taking at Unknown time  . omeprazole  (PRILOSEC) 40 MG capsule Take 40 mg by mouth daily.  0 Not Taking at Unknown time  . pantoprazole (PROTONIX) 40 MG tablet Take 1 tablet (40 mg total) by mouth 2 (two) times daily before a meal. (Patient not taking: Reported on 07/07/2017) 60 tablet 0 Not Taking at Unknown time  . propranolol (INDERAL) 10 MG tablet Take 1 tablet (10 mg total) by mouth 2 (two) times daily. (Patient not taking: Reported on 11/21/2017) 60 tablet 0 Not Taking at Unknown time  . QUEtiapine (SEROQUEL) 100 MG tablet Take 100 mg by mouth at bedtime.   Not Taking at Unknown time  . QUEtiapine (SEROQUEL) 200 MG tablet Take 200 mg by mouth at bedtime.   Not Taking at Unknown time  . QUEtiapine (SEROQUEL) 300 MG tablet Take 1 tablet (300 mg total) by mouth at bedtime. (Patient not taking: Reported on 11/21/2017) 30 tablet 0 Not Taking at Unknown time  . QUEtiapine (SEROQUEL) 50 MG tablet Take 1 tablet (50 mg total) by mouth 2 (two) times daily as needed (severe anxiety/agitation). (Patient not taking: Reported on 11/21/2017) 30 tablet 0 Not Taking at Unknown time    Patient Stressors: Medication change or noncompliance Substance abuse  Patient Strengths: Ability for insight General fund of knowledge Physical  Health  Treatment Modalities: Medication Management, Group therapy, Case management,  1 to 1 session with clinician, Psychoeducation, Recreational therapy.   Physician Treatment Plan for Primary Diagnosis: <principal problem not specified> Long Term Goal(s): Improvement in symptoms so as ready for discharge Improvement in symptoms so as ready for discharge   Short Term Goals: Ability to identify changes in lifestyle to reduce recurrence of condition will improve Ability to verbalize feelings will improve Ability to disclose and discuss suicidal ideas Ability to maintain clinical measurements within normal limits will improve Compliance with prescribed medications will improve Ability to identify triggers associated  with substance abuse/mental health issues will improve  Medication Management: Evaluate patient's response, side effects, and tolerance of medication regimen.  Therapeutic Interventions: 1 to 1 sessions, Unit Group sessions and Medication administration.  Evaluation of Outcomes: Adequate for Discharge  Physician Treatment Plan for Secondary Diagnosis: Active Problems:   Schizoaffective disorder, bipolar type (HCC)  Long Term Goal(s): Improvement in symptoms so as ready for discharge Improvement in symptoms so as ready for discharge   Short Term Goals: Ability to identify changes in lifestyle to reduce recurrence of condition will improve Ability to verbalize feelings will improve Ability to disclose and discuss suicidal ideas Ability to maintain clinical measurements within normal limits will improve Compliance with prescribed medications will improve Ability to identify triggers associated with substance abuse/mental health issues will improve     Medication Management: Evaluate patient's response, side effects, and tolerance of medication regimen.  Therapeutic Interventions: 1 to 1 sessions, Unit Group sessions and Medication administration.  Evaluation of Outcomes: Adequate for Discharge   RN Treatment Plan for Primary Diagnosis: <principal problem not specified> Long Term Goal(s): Knowledge of disease and therapeutic regimen to maintain health will improve  Short Term Goals: Ability to verbalize frustration and anger appropriately will improve, Ability to disclose and discuss suicidal ideas, Ability to identify and develop effective coping behaviors will improve and Compliance with prescribed medications will improve  Medication Management: RN will administer medications as ordered by provider, will assess and evaluate patient's response and provide education to patient for prescribed medication. RN will report any adverse and/or side effects to prescribing  provider.  Therapeutic Interventions: 1 on 1 counseling sessions, Psychoeducation, Medication administration, Evaluate responses to treatment, Monitor vital signs and CBGs as ordered, Perform/monitor CIWA, COWS, AIMS and Fall Risk screenings as ordered, Perform wound care treatments as ordered.  Evaluation of Outcomes: Adequate for Discharge   LCSW Treatment Plan for Primary Diagnosis: <principal problem not specified> Long Term Goal(s): Safe transition to appropriate next level of care at discharge, Engage patient in therapeutic group addressing interpersonal concerns.  Short Term Goals: Engage patient in aftercare planning with referrals and resources, Increase social support, Increase emotional regulation, Identify triggers associated with mental health/substance abuse issues and Increase skills for wellness and recovery  Therapeutic Interventions: Assess for all discharge needs, 1 to 1 time with Social worker, Explore available resources and support systems, Assess for adequacy in community support network, Educate family and significant other(s) on suicide prevention, Complete Psychosocial Assessment, Interpersonal group therapy.  Evaluation of Outcomes: Adequate for Discharge   Progress in Treatment: Attending groups: Yes. Participating in groups: Yes. Taking medication as prescribed: Yes. Toleration medication: Yes. Family/Significant other contact made: No, will contact:  SPE completed with patient. Declines consents Patient understands diagnosis: Yes. Discussing patient identified problems/goals with staff: Yes. Medical problems stabilized or resolved: No. Denies suicidal/homicidal ideation: Yes. Issues/concerns per patient self-inventory: No.  New problem(s) identified:  No, Describe:  none  New Short Term/Long Term Goal(s): medication management for mood stabilization; elimination of SI thoughts; development of comprehensive mental wellness/sobriety plan.  Patient Goals:  "Talk about adjusting medications"  Discharge Plan or Barriers: [facility] pamphlet, Mobile Crisis information, and AA/NA information provided to patient for additional community support and resources.   Reason for Continuation of Hospitalization: Anxiety Depression  Estimated Length of Stay: discharge today  Attendees: Patient: 11/05/2018 9:34 AM  Physician:  11/05/2018 9:34 AM  Nursing:  11/05/2018 9:34 AM  RN Care Manager: 11/05/2018 9:34 AM  Social Worker: Enid Cutterharlotte Jehad Bisono, LCSWA 11/05/2018 9:34 AM  Recreational Therapist:  11/05/2018 9:34 AM  Other:  11/05/2018 9:34 AM  Other:  11/05/2018 9:34 AM  Other: 11/05/2018 9:34 AM    Scribe for Treatment Team: Darreld Mcleanharlotte C Brazen Domangue, LCSWA 11/05/2018 9:34 AM

## 2018-11-05 NOTE — Progress Notes (Signed)
  [name] Adult Case Management Discharge Plan :  Will you be returning to the same living situation after discharge:  Yes,  home At discharge, do you have transportation home?: Yes,  taxi/[employer] Do you have the ability to pay for your medications: Yes,  Medicare  Release of information consent forms completed and in the chart; letters on patient's chart  Patient to Follow up at: Follow-up Information    Center, [facility]. Go on 11/11/2018.   Specialty:  Behavioral Health Why:  Please attend your appointment with Dr.Reddy on Tuesday, 11/11/18 at 3:20p.  Contact information: [address] [place] [address] [phone]           Next level of care provider has access to [facility] Link:no  Safety Planning and Suicide Prevention discussed: Yes,  with patient  Have you used any form of tobacco in the last 30 days? (Cigarettes, Smokeless Tobacco, Cigars, and/or Pipes): No  Has patient been referred to the Quitline?: N/A patient is not a smoker  Patient has been referred for addiction treatment: Yes  Darreld Mclean, LCSWA 11/05/2018, 10:12 AM

## 2019-01-13 ENCOUNTER — Other Ambulatory Visit: Payer: Self-pay

## 2019-01-13 ENCOUNTER — Emergency Department (HOSPITAL_COMMUNITY)
Admission: EM | Admit: 2019-01-13 | Discharge: 2019-01-13 | Disposition: A | Discharge status: Home / Self Care | Payer: Medicare Other | Attending: Emergency Medicine | Admitting: Emergency Medicine

## 2019-01-13 DIAGNOSIS — Z79899 Other long term (current) drug therapy: Secondary | ICD-10-CM | POA: Diagnosis not present

## 2019-01-13 DIAGNOSIS — R059 Cough, unspecified: Secondary | ICD-10-CM

## 2019-01-13 DIAGNOSIS — R05 Cough: Secondary | ICD-10-CM | POA: Insufficient documentation

## 2019-01-13 NOTE — ED Notes (Signed)
All appropriate discharge materials reviewed with patient at length. Time for questions provided. Pt denies any further questions at this time. Verbalizes understanding of all provided materials.  

## 2019-01-13 NOTE — Discharge Instructions (Addendum)
Recommend over-the-counter measures: Robitussin cough Syrup; Mucinex, Tylenol as needed.

## 2019-01-13 NOTE — ED Provider Notes (Signed)
[facility]   CSN: [id number] Arrival date & time: 01/13/19  2224    History   Chief Complaint Chief Complaint  Patient presents with  . Cough    HPI Albert [name] is a 43 y.o. male.     Patient to ED with 3 days of productive cough, worse at night, mild frontal headache. No fever, significant congestion, nausea, SOB. No history of asthma. He denies recent travel or known contact with sick individuals. He has not tried anything at home for symptoms.   The history is provided by the patient. No language interpreter was used.  Cough  Associated symptoms: headaches   Associated symptoms: no chest pain, no chills, no diaphoresis, no fever, no myalgias, no rash, no shortness of breath and no sore throat     Past Medical History:  Diagnosis Date  . Alcoholism (HCC)   . Drug abuse (HCC)    xanax addiction 5 years ago  . Medical non-compliance   . Paranoid schizophrenia (HCC)   . Schizophrenia (HCC)   . Seizures [facility](HCC)     Patient Active Problem List   Diagnosis Date Noted  . Paranoid schizophrenia (HCC) 06/05/2018  . Alcohol use disorder, moderate, in sustained remission (HCC) 07/09/2017  . Cannabis use disorder, moderate, in sustained remission (HCC) 07/09/2017  . Elevated TSH 01/05/2017  . Tachycardia 01/05/2017  . Schizoaffective disorder, bipolar type (HCC) 01/04/2017  . Alcoholism (HCC)   . Drug abuse (HCC)   . Medical non-compliance     No past surgical history on file.      Home Medications    Prior to Admission medications   Medication Sig Start Date End Date Taking? Authorizing Provider  divalproex (DEPAKOTE ER) 500 MG 24 hr tablet 1 in am 3 at h s 11/05/18   Malvin JohnsFarah, Brian, MD  gabapentin (NEURONTIN) 300 MG capsule Take 1 capsule (300 mg total) by mouth 3 (three) times daily at 8am, 3pm and bedtime. Patient not taking: Reported on 11/21/2017 07/17/17   Oneta RackLewis, Tanika N, NP  omeprazole (PRILOSEC) 40  MG capsule Take 40 mg by mouth daily. 01/25/18   [provider]  pantoprazole (PROTONIX) 40 MG tablet Take 1 tablet (40 mg total) by mouth 2 (two) times daily before a meal. Patient not taking: Reported on 07/07/2017 01/09/17   Adonis BrookAgustin, Sheila, NP  QUEtiapine (SEROQUEL) 200 MG tablet Take 1 tablet (200 mg total) by mouth at bedtime. 11/05/18   Malvin JohnsFarah, Brian, MD  QUEtiapine (SEROQUEL) 50 MG tablet Take 1 tablet (50 mg total) by mouth 3 (three) times daily as needed (agitation). 11/05/18   Malvin JohnsFarah, Brian, MD  traZODone (DESYREL) 100 MG tablet Take 2 tablets (200 mg total) by mouth at bedtime as needed for sleep. 11/05/18   Malvin JohnsFarah, Brian, MD    Family History Family History  Problem Relation Age of Onset  . Thyroid disease Mother   . Depression Maternal Aunt     Social History Social History   Tobacco Use  . Smoking status: Never Smoker  . Smokeless tobacco: Never Used  . Tobacco comment: Non-smoker  Substance Use Topics  . Alcohol use: Not Currently  . Drug use: Not Currently    Types: Marijuana    Comment: xanax- several years ago     Allergies   Patient has no known allergies.   Review of Systems Review of Systems  Constitutional: Negative for activity change, appetite change, chills, diaphoresis and fever.  HENT: Negative.  Negative  for congestion and sore throat.   Respiratory: Positive for cough. Negative for shortness of breath.   Cardiovascular: Negative.  Negative for chest pain.  Gastrointestinal: Negative.  Negative for abdominal pain and nausea.  Musculoskeletal: Negative.  Negative for myalgias.  Skin: Negative.  Negative for rash.  Neurological: Positive for headaches.     Physical Exam Updated Vital Signs BP (!) 123/97 (BP Location: Right Arm)   Pulse (!) 121   Temp 97.7 F (36.5 C) (Oral)   Resp 18   Ht 5\' 10"  (1.778 m)   Wt 104.3 kg   SpO2 96%   BMI 33.00 kg/m   Physical Exam Vitals signs and nursing note reviewed.  Constitutional:       Appearance: He is well-developed.  HENT:     Head: Normocephalic.     Comments: No significant sinus tenderness.     Nose: Nose normal. No congestion.     Mouth/Throat:     Mouth: Mucous membranes are moist.     Pharynx: Oropharynx is clear.  Eyes:     Conjunctiva/sclera: Conjunctivae normal.  Neck:     Musculoskeletal: Normal range of motion and neck supple.  Cardiovascular:     Rate and Rhythm: Normal rate and regular rhythm.     Heart sounds: No murmur.  Pulmonary:     Effort: Pulmonary effort is normal.     Breath sounds: Normal breath sounds. No wheezing, rhonchi or rales.  Chest:     Chest wall: No tenderness.  Abdominal:     General: Bowel sounds are normal.     Palpations: Abdomen is soft.     Tenderness: There is no abdominal tenderness. There is no guarding or rebound.  Musculoskeletal: Normal range of motion.  Skin:    General: Skin is warm and dry.     Findings: No rash.  Neurological:     Mental Status: He is alert and oriented to person, place, and time.     Cranial Nerves: No cranial nerve deficit.      ED Treatments / Results  Labs (all labs ordered are listed, but only abnormal results are displayed) Labs Reviewed - No data to display  EKG None  Radiology No results found.  Procedures Procedures (including critical care time)  Medications Ordered in ED Medications - No data to display   Initial Impression / Assessment and Plan / ED Course  I have reviewed the triage vital signs and the nursing notes.  Pertinent labs & imaging results that were available during my care of the patient were reviewed by me and considered in my medical decision making (see chart for details).        Patient to ED with productive nocturnal cough and mild frontal headache. No significant congestion, no fever, no SOB, no chest pain, no sore throat. No change in activity or appetite.   The patient is very well appearing. Initially tachycardic on arrival, much  improved at the time of my exam. No hypoxia or SOB. No concerning contacts or travel pattern. Doubt COVID, influenza, significant URI. Consider early URI. Discussed symptoms that should prompt re-examination, specifically, fever, SOB. Suggested follow up with PCP for recheck if this occurs.   Discussed supportive care of symptoms. Recommend OTC cough syrup, Mucinex, Tylenol.   Final Clinical Impressions(s) / ED Diagnoses   Final diagnoses:  None   1. Cough  ED Discharge Orders    None       Elpidio Anis, [place] 01/13/19 2305  Margarita Grizzle, MD 01/16/19 [phone]

## 2019-01-13 NOTE — ED Triage Notes (Signed)
Patient presents to the ED with C/O productive cough with green sputum since Saturday.  Also expresses COVID-19 concerns.

## 2019-01-15 ENCOUNTER — Encounter (HOSPITAL_COMMUNITY): Payer: Self-pay | Admitting: *Deleted

## 2019-01-15 ENCOUNTER — Emergency Department (HOSPITAL_COMMUNITY)
Admission: EM | Admit: 2019-01-15 | Discharge: 2019-01-15 | Discharge status: Against Medical Advice | Payer: Medicare Other | Attending: Emergency Medicine | Admitting: Emergency Medicine

## 2019-01-15 ENCOUNTER — Other Ambulatory Visit: Payer: Self-pay

## 2019-01-15 DIAGNOSIS — R040 Epistaxis: Secondary | ICD-10-CM | POA: Insufficient documentation

## 2019-01-15 DIAGNOSIS — Z5321 Procedure and treatment not carried out due to patient leaving prior to being seen by health care provider: Secondary | ICD-10-CM | POA: Diagnosis not present

## 2019-01-15 NOTE — ED Notes (Signed)
Pt stated "I don't have any symptoms of COVID 19, so I just want to go.  I don't want to go to [facility]."  Pt denies SI/HI or A/V hallucinations.

## 2019-01-15 NOTE — ED Triage Notes (Signed)
Pt is rambling talking about wanting to check for COVID-19.  I went to [facility] yesterday and they told me I had a cold.  "I was wondering basically, with my therapist, I was looking at, why don't you go to the hospital, apparently there is not an answer on your phone.  Apparently Dr. Hassell Halim.  You should go directly to the answer to the people that can give you the answer.  He just told me to take a walk-in and go to the ED.  Apparently the phone would be enough but.Marland KitchenMarland Kitchen"

## 2019-01-22 ENCOUNTER — Emergency Department (HOSPITAL_COMMUNITY): Payer: Medicare Other

## 2019-01-22 ENCOUNTER — Other Ambulatory Visit: Payer: Self-pay

## 2019-01-22 ENCOUNTER — Emergency Department (HOSPITAL_COMMUNITY)
Admission: EM | Admit: 2019-01-22 | Discharge: 2019-01-22 | Disposition: A | Discharge status: Home / Self Care | Payer: Medicare Other | Attending: Emergency Medicine | Admitting: Emergency Medicine

## 2019-01-22 ENCOUNTER — Encounter (HOSPITAL_COMMUNITY): Payer: Self-pay | Admitting: Emergency Medicine

## 2019-01-22 ENCOUNTER — Emergency Department (EMERGENCY_DEPARTMENT_HOSPITAL)
Admission: EM | Admit: 2019-01-22 | Discharge: 2019-01-23 | Disposition: A | Discharge status: Other Acute Inpatient Hospital | Payer: Medicare Other | Source: Home / Self Care | Attending: Emergency Medicine | Admitting: Emergency Medicine

## 2019-01-22 DIAGNOSIS — Z79899 Other long term (current) drug therapy: Secondary | ICD-10-CM

## 2019-01-22 DIAGNOSIS — Z87891 Personal history of nicotine dependence: Secondary | ICD-10-CM | POA: Insufficient documentation

## 2019-01-22 DIAGNOSIS — R059 Cough, unspecified: Secondary | ICD-10-CM

## 2019-01-22 DIAGNOSIS — R05 Cough: Secondary | ICD-10-CM

## 2019-01-22 DIAGNOSIS — F250 Schizoaffective disorder, bipolar type: Secondary | ICD-10-CM | POA: Diagnosis present

## 2019-01-22 DIAGNOSIS — R1110 Vomiting, unspecified: Secondary | ICD-10-CM | POA: Insufficient documentation

## 2019-01-22 DIAGNOSIS — F22 Delusional disorders: Secondary | ICD-10-CM

## 2019-01-22 DIAGNOSIS — R0602 Shortness of breath: Secondary | ICD-10-CM | POA: Insufficient documentation

## 2019-01-22 DIAGNOSIS — R4689 Other symptoms and signs involving appearance and behavior: Secondary | ICD-10-CM

## 2019-01-22 LAB — COMPREHENSIVE METABOLIC PANEL
ALT: 52 U/L — ABNORMAL HIGH (ref 0–44)
AST: 41 U/L (ref 15–41)
Albumin: 4.4 g/dL (ref 3.5–5.0)
Alkaline Phosphatase: 62 U/L (ref 38–126)
Anion gap: 8 (ref 5–15)
BUN: 9 mg/dL (ref 6–20)
CO2: 25 mmol/L (ref 22–32)
Calcium: 9.1 mg/dL (ref 8.9–10.3)
Chloride: 106 mmol/L (ref 98–111)
Creatinine, Ser: 0.83 mg/dL (ref 0.61–1.24)
GFR calc Af Amer: >60 mL/min (ref >60)
GFR calc non Af Amer: >60 mL/min (ref >60)
Glucose, Bld: 113 mg/dL — ABNORMAL HIGH (ref 70–99)
Potassium: 4.2 mmol/L (ref 3.5–5.1)
Sodium: 139 mmol/L (ref 135–145)
Total Bilirubin: 1.3 mg/dL — ABNORMAL HIGH (ref 0.3–1.2)
Total Protein: 7.5 g/dL (ref 6.5–8.1)

## 2019-01-22 LAB — CBC WITH DIFFERENTIAL/PLATELET
Abs Immature Granulocytes: 0.01 10*3/uL (ref 0.00–0.07)
Basophils Absolute: 0 10*3/uL (ref 0.0–0.1)
Basophils Relative: 1 %
Eosinophils Absolute: 0 10*3/uL (ref 0.0–0.5)
Eosinophils Relative: 1 %
HCT: 50 % (ref 39.0–52.0)
Hemoglobin: 16.5 g/dL (ref 13.0–17.0)
Immature Granulocytes: 0 %
Lymphocytes Relative: 37 %
Lymphs Abs: 1.7 10*3/uL (ref 0.7–4.0)
MCH: 32.5 pg (ref 26.0–34.0)
MCHC: 33 g/dL (ref 30.0–36.0)
MCV: 98.4 fL (ref 80.0–100.0)
Monocytes Absolute: 0.4 10*3/uL (ref 0.1–1.0)
Monocytes Relative: 9 %
Neutro Abs: 2.5 10*3/uL (ref 1.7–7.7)
Neutrophils Relative %: 52 %
Platelets: 308 10*3/uL (ref 150–400)
RBC: 5.08 MIL/uL (ref 4.22–5.81)
RDW: 12.7 % (ref 11.5–15.5)
WBC: 4.7 10*3/uL (ref 4.0–10.5)
nRBC: 0 % (ref 0.0–0.2)

## 2019-01-22 LAB — ETHANOL: Alcohol, Ethyl (B): <10 mg/dL (ref <10)

## 2019-01-22 MED ORDER — DIVALPROEX SODIUM ER 500 MG PO TB24
500.0000 mg | ORAL_TABLET | Freq: Every day | ORAL | Status: DC
  Administered 2019-01-22 – 2019-01-23 (×2): 500 mg via ORAL
  Filled 2019-01-22 (×3): qty 1

## 2019-01-22 MED ORDER — PANTOPRAZOLE SODIUM 40 MG PO TBEC
40.0000 mg | DELAYED_RELEASE_TABLET | Freq: Every day | ORAL | Status: DC
  Administered 2019-01-22 – 2019-01-23 (×2): 40 mg via ORAL
  Filled 2019-01-22 (×3): qty 1

## 2019-01-22 MED ORDER — QUETIAPINE FUMARATE 50 MG PO TABS: 50.0000 mg | ORAL_TABLET | Freq: Three times a day (TID) | ORAL | Status: DC | PRN

## 2019-01-22 MED ORDER — ALBUTEROL SULFATE HFA 108 (90 BASE) MCG/ACT IN AERS: 2.0000 | INHALATION_SPRAY | RESPIRATORY_TRACT | 0 refills | Status: DC | PRN

## 2019-01-22 MED ORDER — QUETIAPINE FUMARATE 100 MG PO TABS
200.0000 mg | ORAL_TABLET | Freq: Every day | ORAL | Status: DC
  Administered 2019-01-22: 200 mg via ORAL
  Filled 2019-01-22 (×2): qty 2

## 2019-01-22 NOTE — ED Notes (Signed)
Bed: [facility]4 Expected date:  Expected time:  Means of arrival:  Comments: 

## 2019-01-22 NOTE — ED Provider Notes (Signed)
[facility]-EMERGENCY DEPT Provider Note   CSN: [id number] Arrival date & time: 01/22/19  2037    History   Chief Complaint Chief Complaint  Patient presents with  . Psychiatric Evaluation  . Paranoid    HPI Deronte Solis is a 43 y.o. male.     HPI   42yM who I think came to the ER after he called one of his AA sponsors and was told "[name] I think you need to talk with a mental health professional." HE says he drove his scooter the ER. He is extremely disorganized and I am having a hard time figuring out much more despite several lines of questioning. He keeps referring to AA meeting and giving me convoluted stories about certain people not being medical doctors but saying things inferring that they are.   Past Medical History:  Diagnosis Date  . Alcoholism (HCC)   . Drug abuse (HCC)    xanax addiction 5 years ago  . Medical non-compliance   . Paranoid schizophrenia (HCC)   . Schizophrenia (HCC)   . Seizures [facility])     Patient Active Problem List   Diagnosis Date Noted  . Paranoid schizophrenia (HCC) 06/05/2018  . Alcohol use disorder, moderate, in sustained remission (HCC) 07/09/2017  . Cannabis use disorder, moderate, in sustained remission (HCC) 07/09/2017  . Elevated TSH 01/05/2017  . Tachycardia 01/05/2017  . Schizoaffective disorder, bipolar type (HCC) 01/04/2017  . Alcoholism (HCC)   . Drug abuse (HCC)   . Medical non-compliance     History reviewed. No pertinent surgical history.      Home Medications    Prior to Admission medications   Medication Sig Start Date End Date Taking? Authorizing Provider  albuterol (PROVENTIL HFA;VENTOLIN HFA) 108 (90 Base) MCG/ACT inhaler Inhale 2 puffs into the lungs every 4 (four) hours as needed for wheezing or shortness of breath (or coughing). 01/22/19   Dione Booze, MD  divalproex (DEPAKOTE ER) 500 MG 24 hr tablet 1 in am 3 at h s 11/05/18   Malvin Johns, MD  gabapentin (NEURONTIN) 300 MG  capsule Take 1 capsule (300 mg total) by mouth 3 (three) times daily at 8am, 3pm and bedtime. Patient not taking: Reported on 11/21/2017 07/17/17   Oneta Rack, NP  omeprazole (PRILOSEC) 40 MG capsule Take 40 mg by mouth daily. 01/25/18   [provider]  pantoprazole (PROTONIX) 40 MG tablet Take 1 tablet (40 mg total) by mouth 2 (two) times daily before a meal. Patient not taking: Reported on 07/07/2017 01/09/17   Adonis Brook, NP  QUEtiapine (SEROQUEL) 200 MG tablet Take 1 tablet (200 mg total) by mouth at bedtime. 11/05/18   Malvin Johns, MD  QUEtiapine (SEROQUEL) 50 MG tablet Take 1 tablet (50 mg total) by mouth 3 (three) times daily as needed (agitation). 11/05/18   Malvin Johns, MD  traZODone (DESYREL) 100 MG tablet Take 2 tablets (200 mg total) by mouth at bedtime as needed for sleep. 11/05/18   Malvin Johns, MD    Family History Family History  Problem Relation Age of Onset  . Thyroid disease Mother   . Depression Maternal Aunt     Social History Social History   Tobacco Use  . Smoking status: Never Smoker  . Smokeless tobacco: Never Used  . Tobacco comment: Non-smoker  Substance Use Topics  . Alcohol use: Not Currently  . Drug use: Not Currently    Types: Marijuana    Comment: xanax- several years ago  Allergies   Patient has no known allergies.   Review of Systems Review of Systems  All systems reviewed and negative, other than as noted in HPI.  Physical Exam Updated Vital Signs BP (!) 144/104 (BP Location: Left Arm)   Pulse (!) 103   Temp 97.6 F (36.4 C)   Resp (!) 22   SpO2 98%   Physical Exam Vitals signs and nursing note reviewed.  Constitutional:      General: He is not in acute distress.    Appearance: He is well-developed.  HENT:     Head: Normocephalic and atraumatic.  Eyes:     General:        Right eye: No discharge.        Left eye: No discharge.     Conjunctiva/sclera: Conjunctivae normal.  Neck:     Musculoskeletal: Neck  supple.  Cardiovascular:     Rate and Rhythm: Normal rate and regular rhythm.     Heart sounds: Normal heart sounds. No murmur. No friction rub. No gallop.   Pulmonary:     Effort: Pulmonary effort is normal. No respiratory distress.     Breath sounds: Normal breath sounds.  Abdominal:     General: There is no distension.     Palpations: Abdomen is soft.     Tenderness: There is no abdominal tenderness.  Musculoskeletal:        General: No tenderness.  Skin:    General: Skin is warm and dry.  Neurological:     Mental Status: He is alert.  Psychiatric:     Comments: Rambling speech. Somewhat evasive and defensive when questioned. Poor eye contact. Thought process is illogical.       ED Treatments / Results  Labs (all labs ordered are listed, but only abnormal results are displayed) Labs Reviewed  COMPREHENSIVE METABOLIC PANEL - Abnormal; Notable for the following components:      Result Value   Glucose, Bld 113 (*)    ALT 52 (*)    Total Bilirubin 1.3 (*)    All other components within normal limits  ETHANOL  CBC WITH DIFFERENTIAL/PLATELET  RAPID URINE DRUG SCREEN, HOSP PERFORMED    EKG None  Radiology Dg Chest Port 1 View  Result Date: 01/22/2019 CLINICAL DATA:  Cough. EXAM: PORTABLE CHEST 1 VIEW COMPARISON:  None. FINDINGS: The cardiomediastinal contours are normal. Minimal left paramediastinal scarring. Pulmonary vasculature is normal. No consolidation, pleural effusion, or pneumothorax. No acute osseous abnormalities are seen. IMPRESSION: No acute chest findings. Electronically Signed   By: Narda Rutherford M.D.   On: 01/22/2019 01:06    Procedures Procedures (including critical care time)  Medications Ordered in ED Medications  QUEtiapine (SEROQUEL) tablet 200 mg (has no administration in time range)  QUEtiapine (SEROQUEL) tablet 50 mg (has no administration in time range)  divalproex (DEPAKOTE ER) 24 hr tablet 500 mg (has no administration in time range)   pantoprazole (PROTONIX) EC tablet 40 mg (has no administration in time range)    Initial Impression / Assessment and Plan / ED Course  I have reviewed the triage vital signs and the nursing notes.  Pertinent labs & imaging results that were available during my care of the patient were reviewed by me and considered in my medical decision making (see chart for details).   42yM with very odd behavior. He is extremely disorganized making history very difficult. I do not think there is an acute medical condition though as explanation. Evaluated by TTS. He is  medically cleared. To be assessed by psychiatry in the morning.   Final Clinical Impressions(s) / ED Diagnoses   Final diagnoses:  Paranoia [facility])  Abnormal behavior    ED Discharge Orders    None       Raeford Razor, MD 01/22/19 2327

## 2019-01-22 NOTE — ED Notes (Signed)
Reviewed d/c instructions with pt, who verbalized understanding and had no outstanding questions. Specifically discussed f/u with PCP to monitor s/sx and to determine need for testing in the future if necessary. [employer] & pt labels removed and placed in shred bin. Pt departed in NAD, refused use of wheelchair.

## 2019-01-22 NOTE — ED Triage Notes (Signed)
Patient reports "I am scared because everything is closing down." "They say in AA people relapse whenever they isolate themselves and social distance." Patient appears paranoid. Hx paranoid schizophrenia.

## 2019-01-22 NOTE — Discharge Instructions (Signed)
Take over-the-counter cough medication as needed - look for one with dextromethorphan in it.  Use the inhaler as needed to help control the cough.

## 2019-01-22 NOTE — BH Assessment (Addendum)
Assessment Note  Albert [name] is an 43 y.o. male, who presents voluntary and unaccompanied to [facility]. During the assessment it was hard time getting information from the pt due to his disorganized thought process. Clinician asked the pt, "what brought you to the hospital?" Pt reported, his face is hurting a lot today and wonder if it had anything to do with mental illness. Pt also reported, "there is a lot of things going on in [place]." Pt discussed due to social distancing he can not engage in his normal routine and with social distancing people can relapse. Pt reported, in October 29, 2018 during an AA meeting [name] (an AA member) asked him if he was taking his medications.Pt reported, he said it with hate. Pt went on about how rude it is to make that type of announcement. Clinician would ask questions the pt would respond with a quote from the AA "big book." Pt asked if he was going to [facility] and if "we" would tell [name]. Pt continued to repeat topics of AA, [name] and medications during his assessment. Clinician asked the pt if he was homicidal, clinician observed the pt becoming upset and say, "I don't have enough money to buy a gun." Clinician observed pt became defensive when asked any questions. Clinician explained to the pt that the questions are apart of her assessment. Pt denies, SI, HI, AVH, and access to weapons.   Clinician was unable to assess the following: stressors, self-injurious behaviors, DSS involvement, legal involvement, depressive symptoms, highest grade completed, vegetative symptoms, family history of suicide, family/friend supports, history of abuse.  Pt's UDS is pending. Pt reported, he has been sober from alcohol for 2.5 years. Pt reported, he is prescribed 100 mg Seroquel and 1500 mg Depakote. Pt did not answer when asked who prescribes his medications. Pt reported, not taking his medications as prescribed. Pt reported, being linked to [facility] for counseling. Pt reported, he  went to an AA meeting last night. Pt has previous inpatient admission at [facility] on October 30, 2018.     Pt reported, alert with pressured speech. Pt's eye contact was good. Pt's mood, affect was anxious. Pt's thought process was disorganized. Pt's thought process was impaired. Pt's concentration and impulse control fair. Pt's insight was poor. Pt reported, if discharged from [facility] he could contract for safety. Pt reported, in order for him to sign-in voluntarily the EDP would have to ask him (so he has a choice).   Diagnosis: Schizoaffective Disorder.   Past Medical History:  Past Medical History:  Diagnosis Date  . Alcoholism (HCC)   . Drug abuse (HCC)    xanax addiction 5 years ago  . Medical non-compliance   . Paranoid schizophrenia (HCC)   . Schizophrenia (HCC)   . Seizures (HCC)     History reviewed. No pertinent surgical history.  Family History:  Family History  Problem Relation Age of Onset  . Thyroid disease Mother   . Depression Maternal Aunt     Social History:  reports that he has never smoked. He has never used smokeless tobacco. He reports previous alcohol use. He reports previous drug use. Drug: Marijuana.  Additional Social History:  Alcohol / Drug Use Pain Medications: See MAR Prescriptions: See MAR Over the Counter: See MAR History of alcohol / drug use?: Yes Longest period of sobriety (when/how long): 2.5 years  CIWA: CIWA-Ar BP: (!) 144/104 Pulse Rate: (!) 103 COWS:    Allergies: No Known Allergies  Home Medications: (Not in  a hospital admission)   OB/GYN Status:  No LMP for male patient.  General Assessment Data Location of Assessment: [facility] TTS Assessment: In system Is this a Tele or Face-to-Face Assessment?: Face-to-Face Is this an Initial Assessment or a Re-assessment for this encounter?: Initial Assessment Patient Accompanied by:: N/A Language Other than English: No Living Arrangements: Other (Comment)(Alone. ) What gender do you  identify as?: Male Marital status: Single Living Arrangements: Alone Can pt return to current living arrangement?: Yes Admission Status: Voluntary Is patient capable of signing voluntary admission?: Yes Referral Source: Self/Family/Friend Insurance type: Medicare.     Crisis Care Plan Living Arrangements: Alone Legal Guardian: Other:(Self. ) Name of Psychiatrist: NA Name of Therapist: [facility].  Education Status Is patient currently in school?: No Is the patient employed, unemployed or receiving disability?: (UTA)  Risk to self with the past 6 months Suicidal Ideation: No(Pt denies. ) Has patient been a risk to self within the past 6 months prior to admission? : No(Pt denies. ) Suicidal Intent: No Has patient had any suicidal intent within the past 6 months prior to admission? : No Is patient at risk for suicide?: No Suicidal Plan?: No Has patient had any suicidal plan within the past 6 months prior to admission? : No Access to Means: No(Pt denies. ) What has been your use of drugs/alcohol within the last 12 months?: UDS is pending.  Previous Attempts/Gestures: No How many times?: 0 Other Self Harm Risks: NA Triggers for Past Attempts: None known Intentional Self Injurious Behavior: None(Pt denies. ) Family Suicide History: No Recent stressful life event(s): Other (Comment)(AA, coronavirus.) Persecutory voices/beliefs?: Yes Depression: (UTA) Depression Symptoms: (UTA) Substance abuse history and/or treatment for substance abuse?: Yes Suicide prevention information given to non-admitted patients: Not applicable  Risk to Others within the past 6 months Homicidal Ideation: No(Pt denies. ) Does patient have any lifetime risk of violence toward others beyond the six months prior to admission? : No Thoughts of Harm to Others: No Current Homicidal Intent: No Current Homicidal Plan: No(Pt denies. ) Access to Homicidal Means: No Identified Victim: NA History of harm to others?:  No(Pt denies. ) Assessment of Violence: None Noted Violent Behavior Description: NA Does patient have access to weapons?: No(Pt denies. ) Criminal Charges Pending?: No Does patient have a court date: No Is patient on probation?: No  Psychosis Hallucinations: None noted Delusions: Unspecified  Mental Status Report Appearance/Hygiene: Unremarkable Eye Contact: Good Motor Activity: Unremarkable Speech: Pressured Level of Consciousness: Alert Mood: Anxious Affect: Anxious Anxiety Level: Severe Thought Processes: (disorganized) Judgement: Impaired Orientation: Person, Place, Time Obsessive Compulsive Thoughts/Behaviors: Severe  Cognitive Functioning Concentration: Fair Memory: Unable to Assess Is patient IDD: No Insight: Poor Impulse Control: Fair Appetite: Poor Sleep: No Change Total Hours of Sleep: 8 Vegetative Symptoms: Unable to Assess  ADLScreening [facility] Assessment Services) Patient's cognitive ability adequate to safely complete daily activities?: Yes Patient able to express need for assistance with ADLs?: Yes Independently performs ADLs?: Yes (appropriate for developmental age)  Prior Inpatient Therapy Prior Inpatient Therapy: Yes Prior Therapy Dates: 10/30/2018 Prior Therapy Facilty/Provider(s): [facility]. Reason for Treatment: Paranoid, AVH.   Prior Outpatient Therapy Prior Outpatient Therapy: Yes Prior Therapy Dates: Current. Prior Therapy Facilty/Provider(s): Nadine Counts. Reason for Treatment: AA, counseling.  Does patient have an ACCT team?: No Does patient have Intensive In-House Services?  : No Does patient have [facility] services? : No Does patient have P4CC services?: No  ADL Screening (condition at time of admission) Patient's cognitive ability adequate to  safely complete daily activities?: Yes Is the patient deaf or have difficulty hearing?: No Does the patient have difficulty seeing, even when wearing glasses/contacts?: (UTA) Does the patient have  difficulty concentrating, remembering, or making decisions?: Yes Patient able to express need for assistance with ADLs?: Yes Does the patient have difficulty dressing or bathing?: No Independently performs ADLs?: Yes (appropriate for developmental age) Does the patient have difficulty walking or climbing stairs?: No Weakness of Legs: None Weakness of Arms/Hands: None  Home Assistive Devices/Equipment Home Assistive Devices/Equipment: None    Abuse/Neglect Assessment (Assessment to be complete while patient is alone) Abuse/Neglect Assessment Can Be Completed: Unable to assess, patient is non-responsive or altered mental status     Advance Directives (For Healthcare) Does Patient Have a Medical Advance Directive?: No Would patient like information on creating a medical advance directive?: No - Patient declined          Disposition: Donell Sievert, PA recommends overnight observation for safety, stabilization and re-evaluation. Disposition discussed with Dr. Juleen China and Alexia Freestone, RN.    Disposition Initial Assessment Completed for this Encounter: Yes  On Site Evaluation by: Redmond Pulling, MS, [facility], [facility]. Reviewed with Physician: Dr. Juleen China and Donell Sievert, PA.  Redmond Pulling 01/22/2019 11:06 PM    Redmond Pulling, MS, [facility], [facility] Triage Specialist [phone]

## 2019-01-22 NOTE — BHH Counselor (Signed)
As clinician attempted several times to ask the pt if he has family, friend supports pt cut clinician off and discussed AA, [name] and medications. Clinician was unable to assess family, friend supports.    Redmond Pulling, MS, [facility], [facility] Triage Specialist [phone]

## 2019-01-22 NOTE — ED Notes (Signed)
ED Provider at bedside. 

## 2019-01-22 NOTE — ED Triage Notes (Addendum)
Pt states he has a friend that he believes was dx with COVID-19. Doesn't know for sure. Reports cough for about a week and single episode of emesis today, without continued nausea. Denies SOB, fever, travel outside the country or to other concerning domestic locations.  Pt denies having direct personal contact with COVID+ individual. States he only spoke on the phone with him.

## 2019-01-22 NOTE — ED Provider Notes (Signed)
[facility] EMERGENCY DEPARTMENT Provider Note   CSN: [id number] Arrival date & time: 01/22/19  0018    History   Chief Complaint Chief Complaint  Patient presents with  . Emesis  . Cough    HPI Albert [name] is a 43 y.o. male.  The history is provided by the patient.  He has history of schizophrenia, polysubstance abuse and comes in concerned that he has the COVID-19 virus.  He has had a cough for the last 2 weeks.  Cough is productive of small amount of white sputum.  He states he was short of breath on one occasion 2 or 3 days ago.  He denies fever, chills, sweats.  Denies sore throat.  He has had post tussive emesis, but no nausea and no diarrhea.  He denies arthralgias or myalgias.  He denies any travel outside of the region for the last 2 weeks, and he denies any contact with anyone who is known positive for COVID-19 or at high risk for COVID-19.  Of note, he had been seen in emergency about 1 week ago and was given a cough syrup which he states did not help.    Past Medical History:  Diagnosis Date  . Alcoholism (HCC)   . Drug abuse (HCC)    xanax addiction 5 years ago  . Medical non-compliance   . Paranoid schizophrenia (HCC)   . Schizophrenia (HCC)   . Seizures [facility])     Patient Active Problem List   Diagnosis Date Noted  . Paranoid schizophrenia (HCC) 06/05/2018  . Alcohol use disorder, moderate, in sustained remission (HCC) 07/09/2017  . Cannabis use disorder, moderate, in sustained remission (HCC) 07/09/2017  . Elevated TSH 01/05/2017  . Tachycardia 01/05/2017  . Schizoaffective disorder, bipolar type (HCC) 01/04/2017  . Alcoholism (HCC)   . Drug abuse (HCC)   . Medical non-compliance     No past surgical history on file.      Home Medications    Prior to Admission medications   Medication Sig Start Date End Date Taking? Authorizing Provider  divalproex (DEPAKOTE ER) 500 MG 24 hr tablet 1 in am 3 at h s 11/05/18   Malvin Johns,  MD  gabapentin (NEURONTIN) 300 MG capsule Take 1 capsule (300 mg total) by mouth 3 (three) times daily at 8am, 3pm and bedtime. Patient not taking: Reported on 11/21/2017 07/17/17   Oneta Rack, NP  omeprazole (PRILOSEC) 40 MG capsule Take 40 mg by mouth daily. 01/25/18   [provider]  pantoprazole (PROTONIX) 40 MG tablet Take 1 tablet (40 mg total) by mouth 2 (two) times daily before a meal. Patient not taking: Reported on 07/07/2017 01/09/17   Adonis Brook, NP  QUEtiapine (SEROQUEL) 200 MG tablet Take 1 tablet (200 mg total) by mouth at bedtime. 11/05/18   Malvin Johns, MD  QUEtiapine (SEROQUEL) 50 MG tablet Take 1 tablet (50 mg total) by mouth 3 (three) times daily as needed (agitation). 11/05/18   Malvin Johns, MD  traZODone (DESYREL) 100 MG tablet Take 2 tablets (200 mg total) by mouth at bedtime as needed for sleep. 11/05/18   Malvin Johns, MD    Family History Family History  Problem Relation Age of Onset  . Thyroid disease Mother   . Depression Maternal Aunt     Social History Social History   Tobacco Use  . Smoking status: Never Smoker  . Smokeless tobacco: Never Used  . Tobacco comment: Non-smoker  Substance Use Topics  . Alcohol  use: Not Currently  . Drug use: Not Currently    Types: Marijuana    Comment: xanax- several years ago     Allergies   Patient has no known allergies.   Review of Systems Review of Systems  All other systems reviewed and are negative.    Physical Exam Updated Vital Signs BP (!) 131/95 (BP Location: Right Arm)   Pulse 100   Temp (!) 97.4 F (36.3 C) (Oral)   Resp 18   Ht 5\' 10"  (1.778 m)   Wt 104.3 kg   SpO2 100%   BMI 33.00 kg/m   Physical Exam Vitals signs and nursing note reviewed.    43 year old male, resting comfortably and in no acute distress. Vital signs are significant for borderline elevated diastolic blood pressure. Oxygen saturation is 100%, which is normal. Head is normocephalic and atraumatic. PERRLA,  EOMI. Oropharynx is clear. Neck is nontender and supple without adenopathy or JVD. Back is nontender and there is no CVA tenderness. Lungs are clear without rales, wheezes, or rhonchi. Chest is nontender. Heart has regular rate and rhythm without murmur. Abdomen is soft, flat, nontender without masses or hepatosplenomegaly and peristalsis is normoactive. Extremities have no cyanosis or edema, full range of motion is present. Skin is warm and dry without rash. Neurologic: Mental status is normal, cranial nerves are intact, there are no motor or sensory deficits.  ED Treatments / Results   Radiology Dg Chest Port 1 View  Result Date: 01/22/2019 CLINICAL DATA:  Cough. EXAM: PORTABLE CHEST 1 VIEW COMPARISON:  None. FINDINGS: The cardiomediastinal contours are normal. Minimal left paramediastinal scarring. Pulmonary vasculature is normal. No consolidation, pleural effusion, or pneumothorax. No acute osseous abnormalities are seen. IMPRESSION: No acute chest findings. Electronically Signed   By: Narda Rutherford M.D.   On: 01/22/2019 01:06    Procedures Procedures  Medications Ordered in ED Medications - No data to display   Initial Impression / Assessment and Plan / ED Course  I have reviewed the triage vital signs and the nursing notes.  Pertinent imaging results that were available during my care of the patient were reviewed by me and considered in my medical decision making (see chart for details).  Persistent cough.  Old records are reviewed, and he was seen in the emergency department on March 17 with diagnosis of cough, but I do not see any prescriptions having been given.  Doubt COVID-19 since his symptom complex is not typical for that and he has no significant risk factors.  Currently, per [facility], he does not meet criteria to get testing for COVID-19.  Will check chest x-ray to make sure he has not developed a pneumonia.  Chest x-ray shows no evidence of  pneumonia.  He is given a prescription for albuterol inhaler and is also given instructions regarding COVID-19.  Advised that he should not come to emergency department with concerns regarding COVID-19 unless directed to do so by his primary care provider, or unless he is ill enough to require emergency services.  Final Clinical Impressions(s) / ED Diagnoses   Final diagnoses:  Cough    ED Discharge Orders         Ordered    albuterol (PROVENTIL HFA;VENTOLIN HFA) 108 (90 Base) MCG/ACT inhaler  Every 4 hours PRN     01/22/19 0115           Dione Booze, MD 01/22/19 0119

## 2019-01-22 NOTE — ED Notes (Signed)
Bed: [facility] Expected date:  Expected time:  Means of arrival:  Comments: T4

## 2019-01-23 ENCOUNTER — Inpatient Hospital Stay (HOSPITAL_COMMUNITY)
Admission: AD | Admit: 2019-01-23 | Discharge: 2019-02-02 | DRG: 885 | Disposition: A | Discharge status: Home / Self Care | Payer: Medicare Other | Source: Intra-hospital | Attending: Psychiatry | Admitting: Psychiatry

## 2019-01-23 ENCOUNTER — Other Ambulatory Visit: Payer: Self-pay

## 2019-01-23 ENCOUNTER — Encounter (HOSPITAL_COMMUNITY): Payer: Self-pay | Admitting: Behavioral Health

## 2019-01-23 DIAGNOSIS — Z8349 Family history of other endocrine, nutritional and metabolic diseases: Secondary | ICD-10-CM

## 2019-01-23 DIAGNOSIS — G4700 Insomnia, unspecified: Secondary | ICD-10-CM | POA: Diagnosis present

## 2019-01-23 DIAGNOSIS — Z79899 Other long term (current) drug therapy: Secondary | ICD-10-CM | POA: Diagnosis not present

## 2019-01-23 DIAGNOSIS — F419 Anxiety disorder, unspecified: Secondary | ICD-10-CM

## 2019-01-23 DIAGNOSIS — G629 Polyneuropathy, unspecified: Secondary | ICD-10-CM | POA: Diagnosis present

## 2019-01-23 DIAGNOSIS — F319 Bipolar disorder, unspecified: Secondary | ICD-10-CM | POA: Diagnosis present

## 2019-01-23 DIAGNOSIS — F3160 Bipolar disorder, current episode mixed, unspecified: Secondary | ICD-10-CM | POA: Diagnosis present

## 2019-01-23 DIAGNOSIS — Z9114 Patient's other noncompliance with medication regimen: Secondary | ICD-10-CM

## 2019-01-23 DIAGNOSIS — J45909 Unspecified asthma, uncomplicated: Secondary | ICD-10-CM | POA: Diagnosis present

## 2019-01-23 DIAGNOSIS — F201 Disorganized schizophrenia: Secondary | ICD-10-CM

## 2019-01-23 DIAGNOSIS — F259 Schizoaffective disorder, unspecified: Secondary | ICD-10-CM | POA: Diagnosis present

## 2019-01-23 DIAGNOSIS — R000 Tachycardia, unspecified: Secondary | ICD-10-CM | POA: Diagnosis not present

## 2019-01-23 DIAGNOSIS — F250 Schizoaffective disorder, bipolar type: Secondary | ICD-10-CM

## 2019-01-23 DIAGNOSIS — Z818 Family history of other mental and behavioral disorders: Secondary | ICD-10-CM | POA: Diagnosis not present

## 2019-01-23 LAB — RAPID URINE DRUG SCREEN, HOSP PERFORMED
Amphetamines: NOT DETECTED
Barbiturates: NOT DETECTED
Benzodiazepines: NOT DETECTED
Cocaine: NOT DETECTED
Opiates: NOT DETECTED
Tetrahydrocannabinol: NOT DETECTED

## 2019-01-23 LAB — VALPROIC ACID LEVEL: Valproic Acid Lvl: 14 ug/mL — ABNORMAL LOW (ref 50.0–100.0)

## 2019-01-23 MED ORDER — DIVALPROEX SODIUM 500 MG PO DR TAB
500.0000 mg | DELAYED_RELEASE_TABLET | Freq: Every day | ORAL | Status: DC
  Administered 2019-01-24 – 2019-02-01 (×9): 500 mg via ORAL
  Filled 2019-01-23 (×12): qty 1

## 2019-01-23 MED ORDER — DIVALPROEX SODIUM 500 MG PO DR TAB
1500.0000 mg | DELAYED_RELEASE_TABLET | Freq: Every day | ORAL | Status: DC
  Administered 2019-01-23 – 2019-02-01 (×10): 1500 mg via ORAL
  Filled 2019-01-23 (×11): qty 3

## 2019-01-23 MED ORDER — MAGNESIUM HYDROXIDE 400 MG/5ML PO SUSP: 30.0000 mL | Freq: Every day | ORAL | Status: DC | PRN

## 2019-01-23 MED ORDER — ALUM & MAG HYDROXIDE-SIMETH 200-200-20 MG/5ML PO SUSP
30.0000 mL | ORAL | Status: DC | PRN
  Administered 2019-01-26 – 2019-01-27 (×2): 30 mL via ORAL
  Filled 2019-01-23 (×2): qty 30

## 2019-01-23 MED ORDER — QUETIAPINE FUMARATE 200 MG PO TABS
200.0000 mg | ORAL_TABLET | Freq: Every day | ORAL | Status: DC
  Administered 2019-01-23: 200 mg via ORAL
  Filled 2019-01-23 (×2): qty 1

## 2019-01-23 MED ORDER — ACETAMINOPHEN 325 MG PO TABS: 650.0000 mg | ORAL_TABLET | Freq: Four times a day (QID) | ORAL | Status: DC | PRN

## 2019-01-23 MED ORDER — RISPERIDONE 3 MG PO TABS
3.0000 mg | ORAL_TABLET | Freq: Two times a day (BID) | ORAL | Status: DC
  Filled 2019-01-23 (×4): qty 1

## 2019-01-23 MED ORDER — BENZTROPINE MESYLATE 0.5 MG PO TABS
0.5000 mg | ORAL_TABLET | Freq: Two times a day (BID) | ORAL | Status: DC
  Filled 2019-01-23 (×4): qty 1

## 2019-01-23 NOTE — [facility] Suicide Risk Assessment (Signed)
[facility] Admission Suicide Risk Assessment   Total Time spent with patient: 45 minutes Principal Problem: exacerbation of psychotic disorder Diagnosis:  Active Problems:   Schizoaffective disorder, bipolar type (HCC)  Subjective Data: self-referred w/ somatic c/o discerned to be psychotic   Continued Clinical Symptoms:    The "Alcohol Use Disorders Identification Test", Guidelines for Use in Primary Care, Second Edition.  World Science writer [facility]). Score between 0-7:  no or low risk or alcohol related problems. Score between 8-15:  moderate risk of alcohol related problems. Score between 16-19:  high risk of alcohol related problems. Score 20 or above:  warrants further diagnostic evaluation for alcohol dependence and treatment.   CLINICAL FACTORS:   Schizophrenia:   Paranoid or undifferentiated type vs schizoaffective       COGNITIVE FEATURES THAT CONTRIBUTE TO RISK:  Loss of executive function    SUICIDE RISK:   Minimal: No identifiable suicidal ideation.  Patients presenting with no risk factors but with morbid ruminations; may be classified as minimal risk based on the severity of the depressive symptoms  PLAN OF CARE: see eval-orders  I certify that inpatient services furnished can reasonably be expected to improve the patient's condition.   Malvin Johns, MD 01/23/2019, 1:55 PM

## 2019-01-23 NOTE — ED Notes (Addendum)
Pt ambulatory w/o difficulty to [facility] with [employer] officers, belongings given to officers.  [facility] updated that pt is in transit.  Pt remains pleasant, calm and cooperative.

## 2019-01-23 NOTE — Progress Notes (Signed)
Nursing Progress Note: 7p-7a D: Pt currently presents with a derealization/paranoia/anxiety affect and behavior. Interacting appropriately with the milieu. Pt reports goo sleep during the previous night with current medication regimen.   A: Pt provided with medications per providers orders. Pt's labs and vitals were monitored throughout the night. Pt supported emotionally and encouraged to express concerns and questions. Pt educated on medications.  R: Pt's safety ensured with 15 minute and environmental checks. Pt currently denies SI, HI, and AVH. Pt verbally contracts to seek staff if SI,HI, or AVH occurs and to consult with staff before acting on any harmful thoughts. Will continue to monitor.

## 2019-01-23 NOTE — ED Notes (Addendum)
Sitting quietly on bed reading magazines, watching tv.  PT alert, pleasant, attempted to assess but pt  answered questions with questions. Nad, procedures explained.

## 2019-01-23 NOTE — ED Notes (Signed)
Alert/oriented, eatting snack.  Pt denies si/hi/avh at this time, remains calm and cooperative.

## 2019-01-23 NOTE — ED Notes (Signed)
Dr Sharma Covert and Jacki Cones NP into see.  Pt questioning information that was given, who gave it and wanting to read it.

## 2019-01-23 NOTE — Progress Notes (Signed)
Pt admitted to the adult unit from the  [facility] under IVC. Pt presented with an irritable mood and a flat affect. On admission the pt was noted to have disorganized thoughts, tangential speech, and loose association when answering questions. Pt noted to be preoccupied and paranoid during the admission process. Pt noted to be watchful and fixated on reading every word in each explained document. Pt would stare at the documents and then began looking around the room. Pt denies SI/HI. Pt unable to convey the reason for coming into the hospital for treatment.   According to the consult assessment:" Albert [name] is an 43 y.o. male, who presents voluntary and unaccompanied to [facility]. During the assessment it was hard time getting information from the pt due to his disorganized thought process. Clinician asked the pt, "what brought you to the hospital?" Pt reported, his face is hurting a lot today and wonder if it had anything to do with mental illness. Pt also reported, "there is a lot of things going on in [place]." Pt discussed due to social distancing he can not engage in his normal routine and with social distancing people can relapse. Pt reported, in October 29, 2018 during an AA meeting Albert [name] (an AA member) asked him if he was taking his medications.Pt reported, he said it with hate. Pt went on about how rude it is to make that type of announcement. Clinician would ask questions the pt would respond with a quote from the AA "big book." Pt asked if he was going to [facility] and if "we" would tell Albert [name]. Pt continued to repeat topics of AA, Albert [name] and medications during his assessment. Clinician asked the pt if he was homicidal, clinician observed the pt becoming upset and say, "I don't have enough money to buy a gun." Clinician observed pt became defensive when asked any questions. Clinician explained to the pt that the questions are apart of her assessment. Pt denies, SI, HI, AVH, and access to weapons."

## 2019-01-23 NOTE — Consult Note (Addendum)
[facility] Face-to-Face Psychiatry Consult   Reason for Consult:  Disorganized thought process Referring Physician:  EDP Patient Identification: Albert [name] MRN:  [id number] Principal Diagnosis: Schizoaffective disorder, bipolar type (HCC) Diagnosis:  Principal Problem:   Schizoaffective disorder, bipolar type (HCC)   Total Time spent with patient: 30 minutes  Subjective:   Albert [name] is a 43 y.o. male patient admitted with disorganized thought process.  HPI: Pt was seen and chart reviewed with treatment team and Dr Sharma Covert. Pt denies suicidal/homicidal ideation, denies auditory/visual hallucinations. Pt does seem to be responding to internal stimuli and is disorganized in his thought process. Pt is known to this emergency room system and was admitted to [facility] in January for the same presentation. He attends AA meetings and apparently called his sponsor yesterday and his sponsor told him to come to the ER to speak to a mental health professional. Apparently the pt drove his scooter to the ER. Pt told this Clinical research associate and Dr Sharma Covert this morning that we had the wrong information, wanted to know who said this about him, and asked to see what was written. Pt is on Depakote but it is unclear if he is therapeutic since EDP did not order a level on admission. In January his level was at  67. Pt has been offered a bed at [facility] so the order will be placed to be drawn there this evening. Pt is in need of inpatient psychiatric admission.   Past Psychiatric History: As above  Risk to Self: Suicidal Ideation: No(Pt denies. ) Suicidal Intent: No Is patient at risk for suicide?: No Suicidal Plan?: No Access to Means: No(Pt denies. ) What has been your use of drugs/alcohol within the last 12 months?: UDS is pending.  How many times?: 0 Other Self Harm Risks: NA Triggers for Past Attempts: None known Intentional Self Injurious Behavior: None(Pt denies. ) Risk to Others: Homicidal Ideation: No(Pt  denies. ) Thoughts of Harm to Others: No Current Homicidal Intent: No Current Homicidal Plan: No(Pt denies. ) Access to Homicidal Means: No Identified Victim: NA History of harm to others?: No(Pt denies. ) Assessment of Violence: None Noted Violent Behavior Description: NA Does patient have access to weapons?: No(Pt denies. ) Criminal Charges Pending?: No Does patient have a court date: No Prior Inpatient Therapy: Prior Inpatient Therapy: Yes Prior Therapy Dates: 10/30/2018 Prior Therapy Facilty/Provider(s): [name]. Reason for Treatment: Paranoid, AVH.  Prior Outpatient Therapy: Prior Outpatient Therapy: Yes Prior Therapy Dates: Current. Prior Therapy Facilty/Provider(s): [facility]. Reason for Treatment: AA, counseling.  Does patient have an ACCT team?: No Does patient have Intensive In-House Services?  : No Does patient have [facility] services? : No Does patient have P4CC services?: No  Past Medical History:  Past Medical History:  Diagnosis Date  . Alcoholism (HCC)   . Drug abuse (HCC)    xanax addiction 5 years ago  . Medical non-compliance   . Paranoid schizophrenia (HCC)   . Schizophrenia (HCC)   . Seizures (HCC)    History reviewed. No pertinent surgical history. Family History:  Family History  Problem Relation Age of Onset  . Thyroid disease Mother   . Depression Maternal Aunt    Family Psychiatric  History: As listed above. Social History:  Social History   Substance and Sexual Activity  Alcohol Use Not Currently     Social History   Substance and Sexual Activity  Drug Use Not Currently  . Types: Marijuana   Comment: xanax- several years ago    Social  History   Socioeconomic History  . Marital status: Single    Spouse name: Not on file  . Number of children: Not on file  . Years of education: Not on file  . Highest education level: Not on file  Occupational History  . Not on file  Social Needs  . Financial resource strain: Not on file  . Food  insecurity:    Worry: Not on file    Inability: Not on file  . Transportation needs:    Medical: Not on file    Non-medical: Not on file  Tobacco Use  . Smoking status: Never Smoker  . Smokeless tobacco: Never Used  . Tobacco comment: Non-smoker  Substance and Sexual Activity  . Alcohol use: Not Currently  . Drug use: Not Currently    Types: Marijuana    Comment: xanax- several years ago  . Sexual activity: Not Currently  Lifestyle  . Physical activity:    Days per week: Not on file    Minutes per session: Not on file  . Stress: Not on file  Relationships  . Social connections:    Talks on phone: Not on file    Gets together: Not on file    Attends religious service: Not on file    Active member of club or organization: Not on file    Attends meetings of clubs or organizations: Not on file    Relationship status: Not on file  Other Topics Concern  . Not on file  Social History Narrative   ** Merged History Encounter **       Additional Social History: N/A    Allergies:  No Known Allergies  Labs:  Results for orders placed or performed during the hospital encounter of 01/22/19 (from the past 48 hour(s))  Urine rapid drug screen (hosp performed)     Status: None   Collection Time: 01/22/19  9:41 PM  Result Value Ref Range   Opiates NONE DETECTED NONE DETECTED   Cocaine NONE DETECTED NONE DETECTED   Benzodiazepines NONE DETECTED NONE DETECTED   Amphetamines NONE DETECTED NONE DETECTED   Tetrahydrocannabinol NONE DETECTED NONE DETECTED   Barbiturates NONE DETECTED NONE DETECTED    Comment: (NOTE) DRUG SCREEN FOR MEDICAL PURPOSES ONLY.  IF CONFIRMATION IS NEEDED FOR ANY PURPOSE, NOTIFY LAB WITHIN 5 DAYS. LOWEST DETECTABLE LIMITS FOR URINE DRUG SCREEN Drug Class                     Cutoff (ng/mL) Amphetamine and metabolites    1000 Barbiturate and metabolites    200 Benzodiazepine                 200 Tricyclics and metabolites     300 Opiates and metabolites         300 Cocaine and metabolites        300 THC                            50 Performed at [facility], [address], [place], [place] [address]   Comprehensive metabolic panel     Status: Abnormal   Collection Time: 01/22/19 10:28 PM  Result Value Ref Range   Sodium 139 135 - 145 mmol/L   Potassium 4.2 3.5 - 5.1 mmol/L   Chloride 106 98 - 111 mmol/L   CO2 25 22 - 32 mmol/L   Glucose, Bld 113 (H) 70 - 99 mg/dL  BUN 9 6 - 20 mg/dL   Creatinine, Ser 2.25 0.61 - 1.24 mg/dL   Calcium 9.1 8.9 - 75.0 mg/dL   Total Protein 7.5 6.5 - 8.1 g/dL   Albumin 4.4 3.5 - 5.0 g/dL   AST 41 15 - 41 U/L   ALT 52 (H) 0 - 44 U/L   Alkaline Phosphatase 62 38 - 126 U/L   Total Bilirubin 1.3 (H) 0.3 - 1.2 mg/dL   GFR calc non Af Amer >60 >60 mL/min   GFR calc Af Amer >60 >60 mL/min   Anion gap 8 5 - 15    Comment: Performed at [facility], [address] [place], [place] [address]  Ethanol     Status: None   Collection Time: 01/22/19 10:28 PM  Result Value Ref Range   Alcohol, Ethyl (B) <10 <10 mg/dL    Comment: (NOTE) Lowest detectable limit for serum alcohol is 10 mg/dL. For medical purposes only. Performed at [facility], [address], [place], [place] [address]   CBC with Diff     Status: None   Collection Time: 01/22/19 10:28 PM  Result Value Ref Range   WBC 4.7 4.0 - 10.5 K/uL   RBC 5.08 4.22 - 5.81 MIL/uL   Hemoglobin 16.5 13.0 - 17.0 g/dL   HCT 89.8 42.1 - 03.1 %   MCV 98.4 80.0 - 100.0 fL   MCH 32.5 26.0 - 34.0 pg   MCHC 33.0 30.0 - 36.0 g/dL   RDW 28.1 18.8 - 67.7 %   Platelets 308 150 - 400 K/uL   nRBC 0.0 0.0 - 0.2 %   Neutrophils Relative % 52 %   Neutro Abs 2.5 1.7 - 7.7 K/uL   Lymphocytes Relative 37 %   Lymphs Abs 1.7 0.7 - 4.0 K/uL   Monocytes Relative 9 %   Monocytes Absolute 0.4 0.1 - 1.0 K/uL   Eosinophils Relative 1 %   Eosinophils Absolute 0.0 0.0 - 0.5 K/uL   Basophils Relative 1 %   Basophils  Absolute 0.0 0.0 - 0.1 K/uL   Immature Granulocytes 0 %   Abs Immature Granulocytes 0.01 0.00 - 0.07 K/uL    Comment: Performed at [facility], [address], [place], [place] [address]    Current Facility-Administered Medications  Medication Dose Route Frequency Provider Last Rate Last Dose  . divalproex (DEPAKOTE ER) 24 hr tablet 500 mg  500 mg Oral Daily Raeford Razor, MD   500 mg at 01/23/19 8159  . pantoprazole (PROTONIX) EC tablet 40 mg  40 mg Oral Daily Raeford Razor, MD   40 mg at 01/23/19 4707  . QUEtiapine (SEROQUEL) tablet 200 mg  200 mg Oral QHS Raeford Razor, MD   200 mg at 01/22/19 2330  . QUEtiapine (SEROQUEL) tablet 50 mg  50 mg Oral TID PRN Raeford Razor, MD       Current Outpatient Medications  Medication Sig Dispense Refill  . albuterol (PROVENTIL HFA;VENTOLIN HFA) 108 (90 Base) MCG/ACT inhaler Inhale 2 puffs into the lungs every 4 (four) hours as needed for wheezing or shortness of breath (or coughing). 1 Inhaler 0  . divalproex (DEPAKOTE ER) 500 MG 24 hr tablet 1 in am 3 at h s (Patient taking differently: Take 500-1,500 mg by mouth See admin instructions. 1 in am 3 at bedtime) 120 tablet 2  . gabapentin (NEURONTIN) 300 MG capsule Take 1 capsule (300 mg total) by mouth 3 (three) times daily at 8am,  3pm and bedtime. 30 capsule 0  . omeprazole (PRILOSEC) 40 MG capsule Take 40 mg by mouth daily.  0  . pantoprazole (PROTONIX) 40 MG tablet Take 1 tablet (40 mg total) by mouth 2 (two) times daily before a meal. 60 tablet 0  . QUEtiapine (SEROQUEL) 200 MG tablet Take 1 tablet (200 mg total) by mouth at bedtime. 30 tablet 2  . QUEtiapine (SEROQUEL) 50 MG tablet Take 1 tablet (50 mg total) by mouth 3 (three) times daily as needed (agitation). 30 tablet 2  . traZODone (DESYREL) 100 MG tablet Take 2 tablets (200 mg total) by mouth at bedtime as needed for sleep. 60 tablet 2    Musculoskeletal: Strength & Muscle Tone: within normal limits Gait & Station:  normal Patient leans: N/A  Psychiatric Specialty Exam: Physical Exam  Constitutional: He appears well-developed and well-nourished.  HENT:  Head: Normocephalic.  Respiratory: Effort normal.  Musculoskeletal: Normal range of motion.  Neurological: He is alert.  Psychiatric: His mood appears not anxious. His affect is not labile.    Review of Systems  Psychiatric/Behavioral: The patient is nervous/anxious.   All other systems reviewed and are negative.   Blood pressure 130/89, pulse 72, temperature (!) 97.5 F (36.4 C), temperature source Oral, resp. rate 18, SpO2 98 %.There is no height or weight on file to calculate BMI.  General Appearance: Casual  Eye Contact:  Good  Speech:  Blocked  Volume:  Normal  Mood:  Anxious and Irritable  Affect:  Inappropriate and disorganized  Thought Process:  Disorganized  Orientation:  Full (Time, Place, and Person)  Thought Content:  Illogical  Suicidal Thoughts:  No  Homicidal Thoughts:  No  Memory:  Immediate;   Fair Recent;   Fair Remote;   Fair  Judgement:  Impaired  Insight:  Shallow  Psychomotor Activity:  Increased  Concentration:  Concentration: Fair and Attention Span: Fair  Recall:  [employer] of Knowledge:  Good  Language:  Good  Akathisia:  No  Handed:  Right  AIMS (if indicated):   N/A  Assets:  Architect Housing Social Support Transportation  ADL's:  Intact  Cognition:  WNL  Sleep:   N/A     Treatment Plan Summary: Daily contact with patient to assess and evaluate symptoms and progress in treatment and Medication management  Disposition: Recommend psychiatric Inpatient admission when medically cleared.  Laveda Abbe, NP 01/23/2019 11:11 AM   Patient seen face-to-face for psychiatric evaluation, chart reviewed and case discussed with the physician extender and developed treatment plan. Reviewed the information documented and agree with the treatment  plan.  Juanetta Beets, DO 01/26/19 1:00 PM

## 2019-01-23 NOTE — Progress Notes (Signed)
Received Albert [name] from triage, alert and disorganized. He is calm and cooperative. He was oriented to his new environment and given a snack per his request. He was medicated per order. He is resting quietly in his bed after opening his door. He continued to sleep throughout the night without incident.

## 2019-01-23 NOTE — Progress Notes (Signed)
Pt refused to take Risperdal and Cogentin this evening as scheduled. Pt stated "if the doctor wants me to take these medications then he needs to discuss it with me but if he's going to tie me down and force meds down my throat then I will take the meds." Writer encouraged the pt to take meds the meds as scheduled but the pt decline . Writer informed the pt that he can discuss medications with the MD tomorrow during his assessment. Pt noted to be irritable and verbally aggressive with writer during medication administration.

## 2019-01-23 NOTE — ED Notes (Signed)
Up to the bathroom 

## 2019-01-23 NOTE — H&P (Signed)
Psychiatric Admission Assessment Adult  Patient Identification: Albert [name] MRN:  [id number] Date of Evaluation:  01/23/2019 Chief Complaint:  SCHIZOAFFECTIVE DISORDER; BIPOLAR Principal Diagnosis: <principal problem not specified> Diagnosis:  Active Problems:   Schizoaffective disorder, bipolar type (HCC)  History of Present Illness:   Pt presented voluntarily Was diserned to be disorganized refuses most aspects of MSE- irritable - argumentative states "get your clipboard! You told me not to go to AA! Angry w/o provocation More to follow w diuctaphone  Acc to assessmnet team:  Albert [name] is an 43 y.o. male, who presents voluntary and unaccompanied to [facility]. During the assessment it was hard time getting information from the pt due to his disorganized thought process. Clinician asked the pt, "what brought you to the hospital?" Pt reported, his face is hurting a lot today and wonder if it had anything to do with mental illness. Pt also reported, "there is a lot of things going on in [place]." Pt discussed due to social distancing he can not engage in his normal routine and with social distancing people can relapse. Pt reported, in October 29, 2018 during an AA meeting [name] (an AA member) asked him if he was taking his medications.Pt reported, he said it with hate. Pt went on about how rude it is to make that type of announcement. Clinician would ask questions the pt would respond with a quote from the AA "big book." Pt asked if he was going to [facility] and if "we" would tell [name]. Pt continued to repeat topics of AA, [name] and medications during his assessment. Clinician asked the pt if he was homicidal, clinician observed the pt becoming upset and say, "I don't have enough money to buy a gun." Clinician observed pt became defensive when asked any questions. Clinician explained to the pt that the questions are apart of her assessment. Pt denies, SI, HI, AVH, and access to  weapons.   Clinician was unable to assess the following: stressors, self-injurious behaviors, DSS involvement, legal involvement, depressive symptoms, highest grade completed, vegetative symptoms, family history of suicide, family/friend supports, history of abuse.  Pt's UDS is pending. Pt reported, he has been sober from alcohol for 2.5 years. Pt reported, he is prescribed 100 mg Seroquel and 1500 mg Depakote. Pt did not answer when asked who prescribes his medications. Pt reported, not taking his medications as prescribed. Pt reported, being linked to [facility] for counseling. Pt reported, he went to an AA meeting last night. Pt has previous inpatient admission at [facility] on October 30, 2018.     Pt reported, alert with pressured speech. Pt's eye contact was good. Pt's mood, affect was anxious. Pt's thought process was disorganized. Pt's thought process was impaired. Pt's concentration and impulse control fair. Pt's insight was poor. Pt reported, if discharged from [facility] he could contract for safety. Pt reported, in order for him to sign-in voluntarily the EDP would have to ask him (so he has a choice).   Diagnosis: Schizoaffective Disorder.  Associated Signs/Symptoms:disorganized thought Depression Symptoms:  psychomotor agitation, (Hypo) Manic Symptoms:  Poss halluc Anxiety Symptoms:  somatic concerns and worry of COVID Psychotic Symptoms:  Delusions, PTSD Symptoms: NA Total Time spent with patient: 45 minutes  Past Psychiatric History: extensive- current rx's VPA- seroquel  Is the patient at risk to self? Yes.    Has the patient been a risk to self in the past 6 months? No.  Has the patient been a risk to self within the distant past? No.  Is the patient a risk to others? No.  Has the patient been a risk to others in the past 6 months? No.  Has the patient been a risk to others within the distant past? No.   Prior Inpatient Therapy:  known to [facility] Prior Outpatient Therapy:  as  above- known to [facility]  Alcohol Screening:   Substance Abuse History in the last 12 months:  No. Consequences of Substance Abuse: n/a Previous Psychotropic Medications: Yes  Psychological Evaluations: No  Past Medical History:  Past Medical History:  Diagnosis Date  . Alcoholism (HCC)   . Drug abuse (HCC)    xanax addiction 5 years ago  . Medical non-compliance   . Paranoid schizophrenia (HCC)   . Schizophrenia (HCC)   . Seizures (HCC)    No past surgical history on file. Family History:  Family History  Problem Relation Age of Onset  . Thyroid disease Mother   . Depression Maternal Aunt    Family Psychiatric  History: ukn to pt Tobacco Screening:   Social History:  Social History   Substance and Sexual Activity  Alcohol Use Not Currently     Social History   Substance and Sexual Activity  Drug Use Not Currently  . Types: Marijuana   Comment: xanax- several years ago    Additional Social History:                           Allergies:  No Known Allergies Lab Results:  Results for orders placed or performed during the hospital encounter of 01/22/19 (from the past 48 hour(s))  Urine rapid drug screen (hosp performed)     Status: None   Collection Time: 01/22/19  9:41 PM  Result Value Ref Range   Opiates NONE DETECTED NONE DETECTED   Cocaine NONE DETECTED NONE DETECTED   Benzodiazepines NONE DETECTED NONE DETECTED   Amphetamines NONE DETECTED NONE DETECTED   Tetrahydrocannabinol NONE DETECTED NONE DETECTED   Barbiturates NONE DETECTED NONE DETECTED    Comment: (NOTE) DRUG SCREEN FOR MEDICAL PURPOSES ONLY.  IF CONFIRMATION IS NEEDED FOR ANY PURPOSE, NOTIFY LAB WITHIN 5 DAYS. LOWEST DETECTABLE LIMITS FOR URINE DRUG SCREEN Drug Class                     Cutoff (ng/mL) Amphetamine and metabolites    1000 Barbiturate and metabolites    200 Benzodiazepine                 200 Tricyclics and metabolites     300 Opiates and metabolites        300 Cocaine  and metabolites        300 THC                            50 Performed at [facility], [address], [place], [place] [address]   Comprehensive metabolic panel     Status: Abnormal   Collection Time: 01/22/19 10:28 PM  Result Value Ref Range   Sodium 139 135 - 145 mmol/L   Potassium 4.2 3.5 - 5.1 mmol/L   Chloride 106 98 - 111 mmol/L   CO2 25 22 - 32 mmol/L   Glucose, Bld 113 (H) 70 - 99 mg/dL   BUN 9 6 - 20 mg/dL   Creatinine, Ser 8.25 0.61 - 1.24 mg/dL   Calcium 9.1 8.9 - 00.3 mg/dL  Total Protein 7.5 6.5 - 8.1 g/dL   Albumin 4.4 3.5 - 5.0 g/dL   AST 41 15 - 41 U/L   ALT 52 (H) 0 - 44 U/L   Alkaline Phosphatase 62 38 - 126 U/L   Total Bilirubin 1.3 (H) 0.3 - 1.2 mg/dL   GFR calc non Af Amer >60 >60 mL/min   GFR calc Af Amer >60 >60 mL/min   Anion gap 8 5 - 15    Comment: Performed at [facility], [address], [place], [place] [address]  Ethanol     Status: None   Collection Time: 01/22/19 10:28 PM  Result Value Ref Range   Alcohol, Ethyl (B) <10 <10 mg/dL    Comment: (NOTE) Lowest detectable limit for serum alcohol is 10 mg/dL. For medical purposes only. Performed at [facility], [address] [place], [place] [address]   CBC with Diff     Status: None   Collection Time: 01/22/19 10:28 PM  Result Value Ref Range   WBC 4.7 4.0 - 10.5 K/uL   RBC 5.08 4.22 - 5.81 MIL/uL   Hemoglobin 16.5 13.0 - 17.0 g/dL   HCT 09.8 11.9 - 14.7 %   MCV 98.4 80.0 - 100.0 fL   MCH 32.5 26.0 - 34.0 pg   MCHC 33.0 30.0 - 36.0 g/dL   RDW 82.9 56.2 - 13.0 %   Platelets 308 150 - 400 K/uL   nRBC 0.0 0.0 - 0.2 %   Neutrophils Relative % 52 %   Neutro Abs 2.5 1.7 - 7.7 K/uL   Lymphocytes Relative 37 %   Lymphs Abs 1.7 0.7 - 4.0 K/uL   Monocytes Relative 9 %   Monocytes Absolute 0.4 0.1 - 1.0 K/uL   Eosinophils Relative 1 %   Eosinophils Absolute 0.0 0.0 - 0.5 K/uL   Basophils Relative 1 %   Basophils Absolute 0.0 0.0 -  0.1 K/uL   Immature Granulocytes 0 %   Abs Immature Granulocytes 0.01 0.00 - 0.07 K/uL    Comment: Performed at [facility], [address] [place], [place] [address]  Valproic acid level     Status: Abnormal   Collection Time: 01/23/19  9:38 AM  Result Value Ref Range   Valproic Acid Lvl 14 (L) 50.0 - 100.0 ug/mL    Comment: Performed at [facility], [address], [place], [place] [address]    Blood Alcohol level:  Lab Results  Component Value Date   [facility] <10 01/22/2019   ETH <10 10/30/2018    Metabolic Disorder Labs:  Lab Results  Component Value Date   HGBA1C 5.1 01/05/2017   MPG 100 01/05/2017   Lab Results  Component Value Date   PROLACTIN 30.4 (H) 01/06/2017   PROLACTIN 13.1 01/05/2017   Lab Results  Component Value Date   CHOL 172 01/05/2017   TRIG 112 01/05/2017   HDL 41 01/05/2017   CHOLHDL 4.2 01/05/2017   VLDL 22 01/05/2017   LDLCALC 109 (H) 01/05/2017    Current Medications: No current facility-administered medications for this encounter.    PTA Medications: Medications Prior to Admission  Medication Sig Dispense Refill Last Dose  . albuterol (PROVENTIL HFA;VENTOLIN HFA) 108 (90 Base) MCG/ACT inhaler Inhale 2 puffs into the lungs every 4 (four) hours as needed for wheezing or shortness of breath (or coughing). 1 Inhaler 0 Past Week at Unknown time  . divalproex (DEPAKOTE ER) 500 MG 24 hr tablet 1  in am 3 at h s (Patient taking differently: Take 500-1,500 mg by mouth See admin instructions. 1 in am 3 at bedtime) 120 tablet 2 Past Week at Unknown time  . gabapentin (NEURONTIN) 300 MG capsule Take 1 capsule (300 mg total) by mouth 3 (three) times daily at 8am, 3pm and bedtime. 30 capsule 0 Past Week at Unknown time  . omeprazole (PRILOSEC) 40 MG capsule Take 40 mg by mouth daily.  0 Past Week at Unknown time  . pantoprazole (PROTONIX) 40 MG tablet Take 1 tablet (40 mg total) by mouth 2 (two) times daily before a meal.  60 tablet 0 Past Week at Unknown time  . QUEtiapine (SEROQUEL) 200 MG tablet Take 1 tablet (200 mg total) by mouth at bedtime. 30 tablet 2 Past Week at Unknown time  . QUEtiapine (SEROQUEL) 50 MG tablet Take 1 tablet (50 mg total) by mouth 3 (three) times daily as needed (agitation). 30 tablet 2 Past Month at Unknown time  . traZODone (DESYREL) 100 MG tablet Take 2 tablets (200 mg total) by mouth at bedtime as needed for sleep. 60 tablet 2 Past Month at Unknown time    Musculoskeletal: Strength & Muscle Tone: within normal limits Gait & Station: normal Patient leans: N/A  Psychiatric Specialty Exam: Physical Exam  ROS  There were no vitals taken for this visit.There is no height or weight on file to calculate BMI.  General Appearance: Casual  Eye Contact:  Fair  Speech:  Pressured  Volume:  Increased  Mood:  Angry and Irritable  Affect:  Congruent and Constricted  Thought Process:  Irrelevant  Orientation:  Full (Time, Place, and Person)  Thought Content:  Illogical, Delusions and Tangential  Suicidal Thoughts:  No  Homicidal Thoughts:  No  Memory:  Immediate;   Poor  Judgement:  Impaired  Insight:  Lacking  Psychomotor Activity:  Normal  Concentration:  Concentration: Poor  Recall:  Poor  Fund of Knowledge:  Poor  Language:  Good  Akathisia:  Negative  Handed:  Right  AIMS (if indicated):     Assets:  Physical Health Resilience  ADL's:  Intact  Cognition:  WNL  Sleep:       Treatment Plan Summary: Daily contact with patient to assess and evaluate symptoms and progress in treatment, Medication management and Plan meds added  Observation Level/Precautions:  15 min  Laboratory:  UDS  Psychotherapy:  Reality based  Medications:  More potent d2 needed  Consultations:  n/a  Discharge Concerns:  compliance  Estimated LOS:5  Other:     Physician Treatment Plan for Primary Diagnosis: <principal problem not specified> Long Term Goal(s): Improvement in symptoms so as  ready for discharge  Short Term Goals: Compliance with prescribed medications will improve  Physician Treatment Plan for Secondary Diagnosis: Active Problems:   Schizoaffective disorder, bipolar type (HCC)  Long Term Goal(s): Improvement in symptoms so as ready for discharge  Short Term Goals: Ability to disclose and discuss suicidal ideas  I certify that inpatient services furnished can reasonably be expected to improve the patient's condition.    Malvin Johns, MD 3/27/20201:56 PM

## 2019-01-23 NOTE — ED Notes (Signed)
Up to the bathroom to shower 

## 2019-01-23 NOTE — ED Notes (Addendum)
Report called to [name] RN at [facility]  OK to transport, pt in the shower

## 2019-01-23 NOTE — ED Notes (Signed)
Pt has been accepted to [facility], to be IVC'd

## 2019-01-23 NOTE — BH Assessment (Signed)
[facility] Assessment Progress Note  Per Juanetta Beets, DO, this pt requires psychiatric hospitalization.  Berneice Heinrich, RN, [facility] has assigned pt to [facility] Rm 501-2.  Dr Sharma Covert also finds that pt meets criteria for IVC, which she has initiated.  IVC documents have been faxed to [facility], and at 11:43 Etta Quill confirms receipt.  He has since faxed Findings and Custody Order to this Clinical research associate.  At 11:46 I called [employer] and spoke to [employer], who took demographic information, agreeing to dispatch law enforcement to fill out Return of Service.  [place] police then presented at [facility], completing Return of Service, after which IVC documents were faxed to [facility].  Pt's nurse, Wille Celeste, has been notified, and agrees to call report to [phone].  Pt is to be transported via Patent examiner.   Doylene Canning, [place] Behavioral Health Coordinator [phone]

## 2019-01-23 NOTE — ED Notes (Signed)
[employer] contacted for transport 

## 2019-01-23 NOTE — Tx Team (Signed)
Initial Treatment Plan 01/23/2019 2:53 PM [name] WLK:[id number]    PATIENT STRESSORS: Other: UTA   PATIENT STRENGTHS: Other: UTA   PATIENT IDENTIFIED PROBLEMS: UTA                     DISCHARGE CRITERIA:    PRELIMINARY DISCHARGE PLAN: Attend aftercare/continuing care group Attend PHP/IOP  PATIENT/FAMILY INVOLVEMENT: This treatment plan has been presented to and reviewed with the patient, Albert [name], and/or family member.  The patient and family have been given the opportunity to ask questions and make suggestions.  Layla Barter, RN 01/23/2019, 2:53 PM

## 2019-01-24 DIAGNOSIS — F319 Bipolar disorder, unspecified: Secondary | ICD-10-CM | POA: Diagnosis present

## 2019-01-24 DIAGNOSIS — F3160 Bipolar disorder, current episode mixed, unspecified: Secondary | ICD-10-CM | POA: Diagnosis present

## 2019-01-24 DIAGNOSIS — Z9114 Patient's other noncompliance with medication regimen: Secondary | ICD-10-CM

## 2019-01-24 MED ORDER — ACETAMINOPHEN 325 MG PO TABS
650.0000 mg | ORAL_TABLET | Freq: Four times a day (QID) | ORAL | Status: DC | PRN
  Administered 2019-01-31 (×2): 650 mg via ORAL
  Filled 2019-01-24 (×2): qty 2

## 2019-01-24 MED ORDER — MAGNESIUM HYDROXIDE 400 MG/5ML PO SUSP: 30.0000 mL | Freq: Every day | ORAL | Status: DC | PRN

## 2019-01-24 MED ORDER — QUETIAPINE FUMARATE 300 MG PO TABS
300.0000 mg | ORAL_TABLET | Freq: Every day | ORAL | Status: DC
  Administered 2019-01-24: 300 mg via ORAL
  Filled 2019-01-24 (×2): qty 1

## 2019-01-24 MED ORDER — ZIPRASIDONE MESYLATE 20 MG IM SOLR: 20.0000 mg | Freq: Two times a day (BID) | INTRAMUSCULAR | Status: DC | PRN

## 2019-01-24 MED ORDER — LORAZEPAM 1 MG PO TABS: 1.0000 mg | ORAL_TABLET | ORAL | Status: DC | PRN

## 2019-01-24 MED ORDER — TRAZODONE HCL 50 MG PO TABS: 50.0000 mg | ORAL_TABLET | Freq: Every evening | ORAL | Status: DC | PRN

## 2019-01-24 MED ORDER — HYDROXYZINE HCL 25 MG PO TABS: 25.0000 mg | ORAL_TABLET | Freq: Three times a day (TID) | ORAL | Status: DC | PRN

## 2019-01-24 NOTE — Plan of Care (Signed)
D: Patient presents irritable, argumentative, labile. He reports sleeping well last night and did not request medication to help with sleep. His appetite is good, energy normal and concentration good. He rates his depression, hopelessness and anxiety 0/10. Patient denies SI/HI/AVH.  A: Patient checked q15 min, and checks reviewed. Reviewed medication changes with patient and educated on side effects. Educated patient on importance of attending group therapy sessions and educated on several coping skills. Encouarged participation in milieu through recreation therapy and attending meals with peers. Support and encouragement provided. Fluids offered. R: Patient receptive to education on medications, and is medication compliant. Patient contracts for safety on the unit. Goal: "go to group today"  Problem: Education: Goal: Emotional status will improve Outcome: Not Progressing Goal: Mental status will improve Outcome: Not Progressing   Problem: Activity: Goal: Interest or engagement in activities will improve Outcome: Not Progressing Goal: Sleeping patterns will improve Outcome: Not Progressing

## 2019-01-24 NOTE — Progress Notes (Signed)
Albert [name] (prefers to be call Albert [name]) attended wrap-up group. Pt remains labile/argumentative/irritable with staff; grandiose in thinking. Pt denies SI/HI/AVH/Pain at this time. Pt was apprehensive about bedtime medications. Pt states he prefers to take Seroquel 200 HS. Writer inform him to try 300 HS, re-evaluate in the a.m., and talk to provider tomorrow. Will monitor for escalation of behavior. Support and encouragement offered. Will continue with POC.

## 2019-01-24 NOTE — Progress Notes (Signed)
[facility] MD Progress Note  01/24/2019 10:06 AM Albert [name]  MRN:  [id number] Subjective: Patient is a 43 year old male admitted on 01/23/2019 with worsening symptoms of schizoaffective disorder; bipolar type.  Patient apparently was noncompliant with medications and had worsening paranoia, irritability, pressured speech.  Objective: Patient is seen and examined.  Patient is a 43 year old male with the above-stated past psychiatric history who is seen in follow-up.  He is significantly irritated this morning.  He remains irritable.  He does not understand why he is on Cogentin, Risperdal.  He stated that his outpatient psychiatrist gives him Seroquel and Depakote.  Review of his laboratories on admission revealed his Depakote level to be 14, and we discussed the fact that he had not been taking these medications regularly.  He was more focused on the fact that he felt as though he was not seen long enough on his admission, and that he is upset because he is not been able to go to any groups for coping skills.  He denied any auditory or visual hallucinations, but is paranoid.  He is afebrile and his blood pressure stable.  He had an episode of tachycardia this morning at 129.  It may have been related to his irritability.  An EKG is not available at this time.  Review of his laboratories showed a negative drug screen, blood alcohol was less than 10, as mentioned above Depakote level was 14.  His ALT was mildly elevated at 52.  CBC was within normal limits.  He denied any suicidal ideation.  Nursing notes reflect that he slept 6.75 hours last night.  Principal Problem: <principal problem not specified> Diagnosis: Active Problems:   Schizoaffective disorder, bipolar type (HCC)   Schizoaffective disorder (HCC)   Bipolar disorder (HCC)  Total Time spent with patient: 15 minutes  Past Psychiatric History: See admission H&P  Past Medical History:  Past Medical History:  Diagnosis Date  . Alcoholism  (HCC)   . Drug abuse (HCC)    xanax addiction 5 years ago  . Medical non-compliance   . Paranoid schizophrenia (HCC)   . Schizophrenia (HCC)   . Seizures (HCC)    History reviewed. No pertinent surgical history. Family History:  Family History  Problem Relation Age of Onset  . Thyroid disease Mother   . Depression Maternal Aunt    Family Psychiatric  History: See admission H&P Social History:  Social History   Substance and Sexual Activity  Alcohol Use Not Currently     Social History   Substance and Sexual Activity  Drug Use Not Currently  . Types: Marijuana   Comment: xanax- several years ago    Social History   Socioeconomic History  . Marital status: Single    Spouse name: Not on file  . Number of children: Not on file  . Years of education: Not on file  . Highest education level: Not on file  Occupational History  . Not on file  Social Needs  . Financial resource strain: Not on file  . Food insecurity:    Worry: Not on file    Inability: Not on file  . Transportation needs:    Medical: Not on file    Non-medical: Not on file  Tobacco Use  . Smoking status: Never Smoker  . Smokeless tobacco: Never Used  . Tobacco comment: Non-smoker  Substance and Sexual Activity  . Alcohol use: Not Currently  . Drug use: Not Currently    Types: Marijuana    Comment: xanax-  several years ago  . Sexual activity: Not Currently  Lifestyle  . Physical activity:    Days per week: Not on file    Minutes per session: Not on file  . Stress: Not on file  Relationships  . Social connections:    Talks on phone: Not on file    Gets together: Not on file    Attends religious service: Not on file    Active member of club or organization: Not on file    Attends meetings of clubs or organizations: Not on file    Relationship status: Not on file  Other Topics Concern  . Not on file  Social History Narrative   ** Merged History Encounter **       Additional Social History:                          Sleep: Fair  Appetite:  Fair  Current Medications: Current Facility-Administered Medications  Medication Dose Route Frequency Provider Last Rate Last Dose  . acetaminophen (TYLENOL) tablet 650 mg  650 mg Oral Q6H PRN Antonieta Pert, MD      . alum & mag hydroxide-simeth (MAALOX/MYLANTA) 200-200-20 MG/5ML suspension 30 mL  30 mL Oral Q4H PRN Malvin Johns, MD      . divalproex (DEPAKOTE) DR tablet 1,500 mg  1,500 mg Oral QHS Malvin Johns, MD   1,500 mg at 01/23/19 2100  . divalproex (DEPAKOTE) DR tablet 500 mg  500 mg Oral Q1200 Malvin Johns, MD      . hydrOXYzine (ATARAX/VISTARIL) tablet 25 mg  25 mg Oral TID PRN Antonieta Pert, MD      . ziprasidone (GEODON) injection 20 mg  20 mg Intramuscular Q12H PRN Antonieta Pert, MD       And  . LORazepam (ATIVAN) tablet 1 mg  1 mg Oral PRN Antonieta Pert, MD      . magnesium hydroxide (MILK OF MAGNESIA) suspension 30 mL  30 mL Oral Daily PRN Antonieta Pert, MD      . QUEtiapine (SEROQUEL) tablet 300 mg  300 mg Oral QHS Antonieta Pert, MD      . traZODone (DESYREL) tablet 50 mg  50 mg Oral QHS PRN Antonieta Pert, MD        Lab Results:  Results for orders placed or performed during the hospital encounter of 01/22/19 (from the past 48 hour(s))  Urine rapid drug screen (hosp performed)     Status: None   Collection Time: 01/22/19  9:41 PM  Result Value Ref Range   Opiates NONE DETECTED NONE DETECTED   Cocaine NONE DETECTED NONE DETECTED   Benzodiazepines NONE DETECTED NONE DETECTED   Amphetamines NONE DETECTED NONE DETECTED   Tetrahydrocannabinol NONE DETECTED NONE DETECTED   Barbiturates NONE DETECTED NONE DETECTED    Comment: (NOTE) DRUG SCREEN FOR MEDICAL PURPOSES ONLY.  IF CONFIRMATION IS NEEDED FOR ANY PURPOSE, NOTIFY LAB WITHIN 5 DAYS. LOWEST DETECTABLE LIMITS FOR URINE DRUG SCREEN Drug Class                     Cutoff (ng/mL) Amphetamine and metabolites     1000 Barbiturate and metabolites    200 Benzodiazepine                 200 Tricyclics and metabolites     300 Opiates and metabolites        300 Cocaine and metabolites  300 THC                            50 Performed at [facility], [address], [place], [place] [address]   Comprehensive metabolic panel     Status: Abnormal   Collection Time: 01/22/19 10:28 PM  Result Value Ref Range   Sodium 139 135 - 145 mmol/L   Potassium 4.2 3.5 - 5.1 mmol/L   Chloride 106 98 - 111 mmol/L   CO2 25 22 - 32 mmol/L   Glucose, Bld 113 (H) 70 - 99 mg/dL   BUN 9 6 - 20 mg/dL   Creatinine, Ser 2.11 0.61 - 1.24 mg/dL   Calcium 9.1 8.9 - 17.3 mg/dL   Total Protein 7.5 6.5 - 8.1 g/dL   Albumin 4.4 3.5 - 5.0 g/dL   AST 41 15 - 41 U/L   ALT 52 (H) 0 - 44 U/L   Alkaline Phosphatase 62 38 - 126 U/L   Total Bilirubin 1.3 (H) 0.3 - 1.2 mg/dL   GFR calc non Af Amer >60 >60 mL/min   GFR calc Af Amer >60 >60 mL/min   Anion gap 8 5 - 15    Comment: Performed at [facility], [address], [place], [place] [address]  Ethanol     Status: None   Collection Time: 01/22/19 10:28 PM  Result Value Ref Range   Alcohol, Ethyl (B) <10 <10 mg/dL    Comment: (NOTE) Lowest detectable limit for serum alcohol is 10 mg/dL. For medical purposes only. Performed at [facility], [address], [place], [place] [address]   CBC with Diff     Status: None   Collection Time: 01/22/19 10:28 PM  Result Value Ref Range   WBC 4.7 4.0 - 10.5 K/uL   RBC 5.08 4.22 - 5.81 MIL/uL   Hemoglobin 16.5 13.0 - 17.0 g/dL   HCT 13.1 43.8 - 88.7 %   MCV 98.4 80.0 - 100.0 fL   MCH 32.5 26.0 - 34.0 pg   MCHC 33.0 30.0 - 36.0 g/dL   RDW 57.9 72.8 - 20.6 %   Platelets 308 150 - 400 K/uL   nRBC 0.0 0.0 - 0.2 %   Neutrophils Relative % 52 %   Neutro Abs 2.5 1.7 - 7.7 K/uL   Lymphocytes Relative 37 %   Lymphs Abs 1.7 0.7 - 4.0 K/uL   Monocytes Relative 9 %   Monocytes  Absolute 0.4 0.1 - 1.0 K/uL   Eosinophils Relative 1 %   Eosinophils Absolute 0.0 0.0 - 0.5 K/uL   Basophils Relative 1 %   Basophils Absolute 0.0 0.0 - 0.1 K/uL   Immature Granulocytes 0 %   Abs Immature Granulocytes 0.01 0.00 - 0.07 K/uL    Comment: Performed at [facility], [address], [place], [place] [address]  Valproic acid level     Status: Abnormal   Collection Time: 01/23/19  9:38 AM  Result Value Ref Range   Valproic Acid Lvl 14 (L) 50.0 - 100.0 ug/mL    Comment: Performed at [facility], [address], [place], [place] [address]    Blood Alcohol level:  Lab Results  Component Value Date   [facility] <10 01/22/2019   ETH <10 10/30/2018    Metabolic Disorder Labs: Lab Results  Component Value Date   HGBA1C 5.1 01/05/2017   MPG 100 01/05/2017  Lab Results  Component Value Date   PROLACTIN 30.4 (H) 01/06/2017   PROLACTIN 13.1 01/05/2017   Lab Results  Component Value Date   CHOL 172 01/05/2017   TRIG 112 01/05/2017   HDL 41 01/05/2017   CHOLHDL 4.2 01/05/2017   VLDL 22 01/05/2017   LDLCALC 109 (H) 01/05/2017    Physical Findings: AIMS: Facial and Oral Movements Muscles of Facial Expression: None, normal Lips and Perioral Area: None, normal Jaw: None, normal Tongue: None, normal,Extremity Movements Upper (arms, wrists, hands, fingers): None, normal Lower (legs, knees, ankles, toes): None, normal, Trunk Movements Neck, shoulders, hips: None, normal, Overall Severity Severity of abnormal movements (highest score from questions above): None, normal Incapacitation due to abnormal movements: None, normal Patient's awareness of abnormal movements (rate only patient's report): No Awareness, Dental Status Current problems with teeth and/or dentures?: No Does patient usually wear dentures?: No  CIWA:  CIWA-Ar Total: 0 COWS:  COWS Total Score: 0  Musculoskeletal: Strength & Muscle Tone: within normal limits Gait &  Station: normal Patient leans: N/A  Psychiatric Specialty Exam: Physical Exam  Nursing note and vitals reviewed. Constitutional: He is oriented to person, place, and time. He appears well-developed and well-nourished.  HENT:  Head: Normocephalic and atraumatic.  Respiratory: Effort normal.  Neurological: He is alert and oriented to person, place, and time.    ROS  Blood pressure 106/63, pulse (!) 129, temperature 97.8 F (36.6 C), resp. rate 18, height  (1.778 m), weight 97.1 kg.Body mass index is 30.71 kg/m.  General Appearance: Casual  Eye Contact:  Good  Speech:  Normal Rate  Volume:  Increased  Mood:  Dysphoric and Irritable  Affect:  Labile  Thought Process:  Coherent and Descriptions of Associations: Circumstantial  Orientation:  Full (Time, Place, and Person)  Thought Content:  Paranoid Ideation  Suicidal Thoughts:  No  Homicidal Thoughts:  No  Memory:  Immediate;   Fair Recent;   Fair Remote;   Fair  Judgement:  Impaired  Insight:  Lacking  Psychomotor Activity:  Increased  Concentration:  Concentration: Fair and Attention Span: Fair  Recall:  Poor  Fund of Knowledge:  Fair  Language:  Fair  Akathisia:  Negative  Handed:  Left  AIMS (if indicated):     Assets:  Desire for Improvement Physical Health Resilience  ADL's:  Intact  Cognition:  WNL  Sleep:  Number of Hours: 6.75     Treatment Plan Summary: Daily contact with patient to assess and evaluate symptoms and progress in treatment, Medication management and Plan Patient is seen and examined.  Patient is a 43 year old male with the above-stated past psychiatric history was seen in follow-up.    Diagnosis: #1 schizoaffective disorder; bipolar type  Patient is seen and examined.  Patient is a 43 year old male with the above-stated past psychiatric history.  He remains with irritability, paranoid ideation, some degree of disorganization.  He has refused the Risperdal, and I am just going to go on and  eliminate that for now.  Additionally am going to stop the Cogentin.  I will increase his Seroquel to 300 mg p.o. nightly.  I have also added an agitation protocol given his irritability.  He was tachycardic this morning, and we will attempt to get an EKG just to make sure that it is just sinus tachycardia.  He had been noncompliant with his Depakote given his Depakote level.  Hopefully this will be an improvement once we get it into his system. 1.  Continue  Depakote DR 1500 mg p.o. nightly and 500 mg p.o. daily 2.  Continue hydroxyzine 25 mg p.o. 3 times daily as needed anxiety. 3.  Increase Seroquel to 300 mg p.o. nightly for mood and sedation. 4.  Continue trazodone 50 mg p.o. nightly as needed insomnia. 5.  Stopped Risperdal as well as Cogentin. 6.  Addition of Geodon as well as lorazepam for as needed agitation. 7.  EKG today to assess tachycardia. 8.  Disposition planning-in progress.  Antonieta Pert, MD 01/24/2019, 10:06 AM

## 2019-01-24 NOTE — [facility] Group Notes (Signed)
[facility] LCSW Group Therapy Note  Date/Time:  01/24/2019 11:15AM-12:00PM  Type of Therapy and Topic:  Group Therapy:  Feelings About Hospitalization  Participation Level:  Active   Description of Group This process group involved patients discussing their feelings related to being hospitalized, as well as the benefits they see to being in the hospital.  These feelings and benefits were itemized.  The group then brainstormed specific ways in which they could seek those same benefits when they discharge and return home.  This was related specifically to the current social distancing needs facing the country.  Therapeutic Goals 1. Patient will identify and describe positive and negative feelings related to hospitalization 2. Patient will verbalize benefits of hospitalization to themselves personally 3. Patients will brainstorm together ways they can obtain similar benefits in the outpatient setting, identify barriers to wellness and possible solutions  4. Patients will develop ideas together and separately for how to socially distance themselves while enhancing their mental health and avoiding isolation.  Summary of Patient Progress:  The patient was unable to answer the question of how he feels about being in the hospital, stating he did not understand the question and asking for more specificity.  He finally said that he feels he lives a double life, one in which he is involved in Alcoholics Anonymous and one in which he comes to the hospital because he is not eating.  His response was rambling and incoherent.  At one point, the doctor entered the room to request another patient go out to meet and patient became annoyed and refused to speak until everyone was settled and the door closed once again.  Shortly thereafter, the [facility] entered with a workman to spray for bugs in the galley off the dayroom, at which time patient refused to speak any more, stating he had "an intuition" that he should not talk  with that person in the attached room.  Therapeutic Modalities Cognitive Behavioral Therapy Motivational Interviewing    Ambrose Mantle, LCSW 01/24/2019, 1:14 PM

## 2019-01-25 MED ORDER — QUETIAPINE FUMARATE 300 MG PO TABS
300.0000 mg | ORAL_TABLET | Freq: Once | ORAL | Status: AC
  Administered 2019-01-25: 300 mg via ORAL
  Filled 2019-01-25 (×2): qty 1

## 2019-01-25 MED ORDER — QUETIAPINE FUMARATE 400 MG PO TABS
400.0000 mg | ORAL_TABLET | Freq: Every day | ORAL | Status: DC
  Administered 2019-01-29: 400 mg via ORAL
  Filled 2019-01-25 (×6): qty 1

## 2019-01-25 NOTE — Progress Notes (Signed)
The focus of this group is to help patients establish daily goals to achieve during treatment and discuss how the patient can incorporate goal setting into their daily lives to aide in recovery. 

## 2019-01-25 NOTE — BHH Counselor (Signed)
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 Family relationships: Patient reports having firm boundaries with family members Social relationships: Limited supports; not welcome back to [place] group, thinks his friends have all turned against him. Reports that his psychiatrist of 10 years won't let him come back to his practice (Dr.Reddy).  Living/Environment/Situation:  Living Arrangements:Lives alone in [facility] conditions (as described by patient or guardian): Good How long has patient lived in current situation?: Since 2007 What is atmosphere in current home: Comfortable  Family History:  Marital status: Single Does patient have children?: No  Childhood History:  By whom was/is the patient raised?: Both parents Additional childhood history information: Mother mainly took care of [place] Description of patient's relationship with caregiver when they were a child: Good Patient's description of current relationship with people who raised him/her: Good Does patient have siblings?: Yes Number of Siblings: 3 (Has a twin sister and a half brother/sister) Description of patient's current relationship with siblings: Pretty good Did patient suffer any verbal/emotional/physical/sexual abuse as a child?: No Did patient suffer from severe childhood neglect?: No Has patient ever been sexually abused/assaulted/raped as an adolescent or adult?: No Was the patient ever a victim of a crime or a disaster?: No Witnessed domestic violence?: No Has patient been effected by  domestic violence as an adult?: No  Education:  Highest grade of school patient has completed: 12th grade Currently a student?: No Learning disability?: No  Employment/Work Situation:  Employment situation: On disability Why is patient on disability: Bipolar/Depression How long has patient been on disability: about 5 years Patient's job has been impacted by current illness: No What is the longest time patient has a held a job?: 3 years 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 Has patient ever been in the [employer]?: No Has patient ever served in Recruitment consultant?: No  Financial Resources:  Museum/gallery curator resources: Teacher, early years/pre Does patient have a Programmer, applications or guardian?: No  Alcohol/Substance Abuse:  What has been your use of drugs/alcohol within the last 12 months?: None If attempted suicide, did drugs/alcohol play a role in this?: No Alcohol/Substance Abuse Treatment Hx: Denies past history If yes, describe treatment: N/A Has alcohol/substance abuse ever caused legal problems?: No  Social Support System: Pensions consultant Support System: Good Describe Community Support System: Family Type of faith/religion: [place] How does patient's faith help to cope with current illness?: Unable to say  Leisure/Recreation:  Leisure and Hobbies: Bike riding, Research officer, trade union, conversations with others  Strengths/Needs:  What things does the patient do well?: Honesty, loyalty and integrity. Learning to have good human relations In what areas does patient struggle / problems for patient:Patient spoke at length about sensing hate and negative energy, people working against him, etc  Discharge Plan:  Does patient have access to transportation?: Yes Will patient be returning to same living situation after discharge?: Yes Currently receiving community mental health services: No. Patient reports that he hasn't seen his psychiatrist, Dr.Reddy "in months." Chart review  from prior [facility] admission in January 2020 indicates that patient had a scheduled appointment with provider on 11/11/2018. Patient reports "things didn't work out" with a therapist at [facility] practice and  patient, through no fault of his own, is not allowed to continue seeing Dr.Reddy. Patient reports he is angry as he has been established with Dr.Reddy for the last 10 years.  If no, would patient like referral for services when discharged?: He is interested in a referral for a new psychiatrist. Patient very paranoid during assessment, but CSW is once again recommending ACTT services for patient. Does patient have financial barriers related to discharge medications?: No  Summary/Recommendations:   Summary and Recommendations (to be completed by the evaluator): [name] is a 43 year old male from [place] with a history of schizophrenia and substance use.  Patient was irritable, guarded, and paranoid during assessment. When asked what happened prior to admission patient rambles on tangentially and answers questions only with more questions. He spoke of a hypothetical 3rd person being "a paranoid schizophrenic, but that doesn't mean I am. This is a dilemma, we have to search the DSM 4 together and find understanding." This is the patient's 4th [facility] admission, most recent prior admission was in January 2020. Patient reports he was banned from his psychiatrists office and hasn't been seen in months. Patient would benefit from crisis stabilization, therapeutic milieu, medication management, and referral resources. Patient will likely benefit from ACTT referrals.  [name]. 01/25/2019

## 2019-01-25 NOTE — Plan of Care (Signed)
D: Patient presents argumentative, pressured speech. He continually argues with staff and instructs staff how to do their job. He is intrusive, but paranoid when asked to participate in daily assessment. His behavior is bizarre and his speech is mildly disorganized. He slept well last night and denied a need for sleep medication. His appetite is good, energy normal and concentration good. He rates his depression, hopelessness and anxiety 0/10. He denies physical symptoms or withdrawal complaints. Patient denies SI/HI/AVH.  A: Patient checked q15 min, and checks reviewed. Reviewed medication changes with patient and educated on side effects. Educated patient on importance of attending group therapy sessions and educated on several coping skills. Encouarged participation in milieu through recreation therapy and attending meals with peers. Support and encouragement provided. Fluids offered. R: Patient receptive to education on medications, and is medication compliant. Patient contracts for safety on the unit. Goal: "Go to   Problem: Education: Goal: Emotional status will improve Outcome: Not Progressing Goal: Mental status will improve Outcome: Not Progressing   Problem: Activity: Goal: Interest or engagement in activities will improve Outcome: Not Progressing Goal: Sleeping patterns will improve Outcome: Not Progressing

## 2019-01-25 NOTE — [facility] Group Notes (Signed)
[facility] LCSW Group Therapy Note  Date/Time:  01/25/2019  11:00AM-12:00PM  Type of Therapy and Topic:  Group Therapy:  Music and Mood  Participation Level:  Active   Description of Group: In this process group, members listened to a variety of genres of music and identified that different types of music evoke different responses.  Patients were encouraged to identify music that was soothing for them and music that was energizing for them.  Patients discussed how this knowledge can help with wellness and recovery in various ways including managing depression and anxiety as well as encouraging healthy sleep habits.    Therapeutic Goals: 1. Patients will explore the impact of different varieties of music on mood 2. Patients will verbalize the thoughts they have when listening to different types of music 3. Patients will identify music that is soothing to them as well as music that is energizing to them 4. Patients will discuss how to use this knowledge to assist in maintaining wellness and recovery 5. Patients will explore the use of music as a coping skill  Summary of Patient Progress:  At the beginning of group, patient expressed that he felt "good" and would not elaborate on this.  He was less paranoid and questioned CSW less today.  He did choose a song for the group to listen to and was visibly pleased when other patients said they liked it.  Therapeutic Modalities: Solution Focused Brief Therapy Activity   Ambrose Mantle, LCSW

## 2019-01-25 NOTE — Plan of Care (Signed)
D: Patient is alert and in the dayroom on approach. Denies SI, HI, AVH, and verbally contracts for safety.  Patient reports "everything is great". Patient denies physical symptoms/pain. Patient reports he is uncomfortable with taking the amount of Seroquel prescribed for this evening. He is "willing to take 300 mg but not 400 mg". He reports he feels this may be related to "sleeping habits being different in the hospital" or "the doctor lacking compassion" or "taking his tears from him". Patient presents as paranoid and delusional. Speech is pressured, disorganized, and can be tangential.   A: Scheduled 400 mg Seroquel not administered per MD order. One time 300 mg Seroquel order obtained form on call provider. Other medications administered per MD order. Support provided. Patient educated on safety on the unit and medications. Routine safety checks every 15 minutes. Patient stated understanding to tell nurse about any new physical symptoms. Patient understands to tell staff of any needs.     R: No adverse drug reactions noted. Patient verbally contracts for safety. Patient remains safe at this time and will continue to monitor.   Problem: Safety: Goal: Periods of time without injury will increase Outcome: Progressing   Problem: Coping: Goal: Will verbalize feelings Outcome: Progressing   Patient is able to verbalize his feelings though he is disorganized and paranoid. Patient remains safe and will continue to monitor.

## 2019-01-25 NOTE — Progress Notes (Signed)
CSW met with patient to complete PSA. Patient familiar with CSW from prior [facility] admission. Patient was irritable, guarded, and paranoid during assessment.   When asked what happened prior to admission patient rambles on tangentially and answers questions only with more questions. He spoke of a hypothetical 3rd person being "a paranoid schizophrenic, but that doesn't mean I am. This is a dilemma, we have to search the DSM 4 together and find understanding." This is the patient's 4th [facility] admission, most recent prior admission was in January 2020.  Patient reports that he hasn't seen his psychiatrist, Dr.Reddy "in months." Chart review from prior [facility] admission in January 2020 indicates that patient had a scheduled appointment with provider on 11/11/2018. Patient reports "things didn't work out" with a therapist at Dr.Reddy's practice and patient, through no fault of his own, is not allowed to continue seeing Dr.Reddy. Patient reports he is angry as he has been established with Dr.Reddy for the last 10 years. He is interested in a referral for a new psychiatrist  Stephanie Acre, [place] Worker

## 2019-01-25 NOTE — Progress Notes (Signed)
[facility]  01/25/2019 11:29 AM Albert [name]  MRN:  [id number] Subjective:  Patient is a 43 year old male admitted on 01/23/2019 with worsening symptoms of schizoaffective disorder; bipolar type.  Patient apparently was noncompliant with medications and had worsening paranoia, irritability, pressured speech.  Objective: Patient is seen and examined.  Patient is a 43 year old male with the above-stated past psychiatric history seen in follow-up.  He remains significantly irritable.  He is demanding at times.  He is upset over the fact that I have increased his Seroquel to 300 mg instead of the 200 mg.  He initially refused it last night, but then did take it.  I told him that because we stopped the Risperdal I felt necessary to increase his Seroquel.  He is unhappy about that.  He remains pressured, paranoid, irritable.  He is tachycardic this morning with a rate of 112.  His blood pressure is mildly elevated at 141/90.  He only slept 5.5 hours last night.  His EKG which was obtained on 3/28 showed normal sinus rhythm, and normal QTc interval.  He denied any suicidal or homicidal ideation.  Principal Problem: <principal problem not specified> Diagnosis: Active Problems:   Schizoaffective disorder, bipolar type (HCC)   Schizoaffective disorder (HCC)   Bipolar disorder (HCC)  Total Time spent with patient: 15 minutes  Past Psychiatric History: See admission H&P  Past Medical History:  Past Medical History:  Diagnosis Date  . Alcoholism (HCC)   . Drug abuse (HCC)    xanax addiction 5 years ago  . Medical non-compliance   . Paranoid schizophrenia (HCC)   . Schizophrenia (HCC)   . Seizures (HCC)    History reviewed. No pertinent surgical history. Family History:  Family History  Problem Relation Age of Onset  . Thyroid disease Mother   . Depression Maternal Aunt    Family Psychiatric  History: See admission H&P Social History:  Social History   Substance and Sexual  Activity  Alcohol Use Not Currently     Social History   Substance and Sexual Activity  Drug Use Not Currently  . Types: Marijuana   Comment: xanax- several years ago    Social History   Socioeconomic History  . Marital status: Single    Spouse name: Not on file  . Number of children: Not on file  . Years of education: Not on file  . Highest education level: Not on file  Occupational History  . Not on file  Social Needs  . Financial resource strain: Not on file  . Food insecurity:    Worry: Not on file    Inability: Not on file  . Transportation needs:    Medical: Not on file    Non-medical: Not on file  Tobacco Use  . Smoking status: Never Smoker  . Smokeless tobacco: Never Used  . Tobacco comment: Non-smoker  Substance and Sexual Activity  . Alcohol use: Not Currently  . Drug use: Not Currently    Types: Marijuana    Comment: xanax- several years ago  . Sexual activity: Not Currently  Lifestyle  . Physical activity:    Days per week: Not on file    Minutes per session: Not on file  . Stress: Not on file  Relationships  . Social connections:    Talks on phone: Not on file    Gets together: Not on file    Attends religious service: Not on file    Active member of club or organization: Not on  file    Attends meetings of clubs or organizations: Not on file    Relationship status: Not on file  Other Topics Concern  . Not on file  Social History Narrative   ** Merged History Encounter **       Additional Social History:                         Sleep: Fair  Appetite:  Fair  Current Medications: Current Facility-Administered Medications  Medication Dose Route Frequency Provider Last Rate Last Dose  . acetaminophen (TYLENOL) tablet 650 mg  650 mg Oral Q6H PRN Antonieta Pert, MD      . alum & mag hydroxide-simeth (MAALOX/MYLANTA) 200-200-20 MG/5ML suspension 30 mL  30 mL Oral Q4H PRN Malvin Johns, MD      . divalproex (DEPAKOTE) DR tablet  1,500 mg  1,500 mg Oral QHS Malvin Johns, MD   1,500 mg at 01/24/19 2043  . divalproex (DEPAKOTE) DR tablet 500 mg  500 mg Oral Q1200 Malvin Johns, MD   500 mg at 01/25/19 1120  . hydrOXYzine (ATARAX/VISTARIL) tablet 25 mg  25 mg Oral TID PRN Antonieta Pert, MD      . ziprasidone (GEODON) injection 20 mg  20 mg Intramuscular Q12H PRN Antonieta Pert, MD       And  . LORazepam (ATIVAN) tablet 1 mg  1 mg Oral PRN Antonieta Pert, MD      . magnesium hydroxide (MILK OF MAGNESIA) suspension 30 mL  30 mL Oral Daily PRN Antonieta Pert, MD      . QUEtiapine (SEROQUEL) tablet 400 mg  400 mg Oral QHS Antonieta Pert, MD      . traZODone (DESYREL) tablet 50 mg  50 mg Oral QHS PRN Antonieta Pert, MD        Lab Results: No results found for this or any previous visit (from the past 48 hour(s)).  Blood Alcohol level:  Lab Results  Component Value Date   ETH <10 01/22/2019   ETH <10 10/30/2018    Metabolic Disorder Labs: Lab Results  Component Value Date   HGBA1C 5.1 01/05/2017   MPG 100 01/05/2017   Lab Results  Component Value Date   PROLACTIN 30.4 (H) 01/06/2017   PROLACTIN 13.1 01/05/2017   Lab Results  Component Value Date   CHOL 172 01/05/2017   TRIG 112 01/05/2017   HDL 41 01/05/2017   CHOLHDL 4.2 01/05/2017   VLDL 22 01/05/2017   LDLCALC 109 (H) 01/05/2017    Physical Findings: AIMS: Facial and Oral Movements Muscles of Facial Expression: None, normal Lips and Perioral Area: None, normal Jaw: None, normal Tongue: None, normal,Extremity Movements Upper (arms, wrists, hands, fingers): None, normal Lower (legs, knees, ankles, toes): None, normal, Trunk Movements Neck, shoulders, hips: None, normal, Overall Severity Severity of abnormal movements (highest score from questions above): None, normal Incapacitation due to abnormal movements: None, normal Patient's awareness of abnormal movements (rate only patient's report): No Awareness, Dental  Status Current problems with teeth and/or dentures?: No Does patient usually wear dentures?: No  CIWA:  CIWA-Ar Total: 0 COWS:  COWS Total Score: 2  Musculoskeletal: Strength & Muscle Tone: within normal limits Gait & Station: normal Patient leans: N/A  Psychiatric Specialty Exam: Physical Exam  Nursing note and vitals reviewed. Constitutional: He is oriented to person, place, and time. He appears well-developed and well-nourished.  HENT:  Head: Normocephalic and atraumatic.  Respiratory: Effort  normal.  Neurological: He is alert and oriented to person, place, and time.    ROS  Blood pressure (!) 141/90, pulse (!) 112, temperature 98.4 F (36.9 C), temperature source Oral, resp. rate 20, height 5\' 10"  (1.778 m), weight 97.1 kg.Body mass index is 30.71 kg/m.  General Appearance: Casual  Eye Contact:  Good  Speech:  Pressured  Volume:  Increased  Mood:  Anxious, Dysphoric and Irritable  Affect:  Labile  Thought Process:  Coherent and Descriptions of Associations: Tangential  Orientation:  Full (Time, Place, and Person)  Thought Content:  Paranoid Ideation and Tangential  Suicidal Thoughts:  No  Homicidal Thoughts:  No  Memory:  Immediate;   Fair Recent;   Fair Remote;   Fair  Judgement:  Impaired  Insight:  Lacking  Psychomotor Activity:  Increased  Concentration:  Concentration: Fair and Attention Span: Fair  Recall:  [employer] of Knowledge:  Fair  Language:  Good  Akathisia:  Negative  Handed:  [name]  AIMS (if indicated):     Assets:  Desire for Improvement Housing Physical Health Resilience  ADL's:  Intact  Cognition:  WNL  Sleep:  Number of Hours: 5.5     Treatment Plan Summary: Daily contact with patient to assess and evaluate symptoms and progress in treatment, Medication management and Plan : Patient is seen and examined.  Patient is a 43 year old male with the above-stated past psychiatric history who is seen in follow-up.   Diagnosis: #1  schizoaffective disorder; bipolar type.  Patient is seen and examined.  Patient is a 43 year old male with the above-stated past psychiatric history who is seen in follow-up.  He remains irritable, paranoid, somewhat disorganized, as well as tangential.  He did take the Seroquel 300 mg p.o. nightly, and I am going to increase that to 400 mg p.o. nightly.  We will continue the Depakote DR 1500 mg p.o. nightly and 500 mg p.o. daily.  He will also have trazodone 50 mg p.o. nightly as needed if necessary. 1.  Continue Depakote DR 1500 mg p.o. nightly and 500 mg p.o. daily 2.  Continue hydroxyzine 25 mg p.o. 3 times daily as needed anxiety. 3.  Increase Seroquel to 400 mg p.o. nightly for mood and sedation. 4.  Continue trazodone 50 mg p.o. nightly as needed insomnia. 5.  Addition of Geodon as well as lorazepam for as needed agitation. 6.  Disposition planning-in progress.  Antonieta Pert, MD 01/25/2019, 11:29 AM

## 2019-01-26 DIAGNOSIS — Z818 Family history of other mental and behavioral disorders: Secondary | ICD-10-CM

## 2019-01-26 MED ORDER — QUETIAPINE FUMARATE 300 MG PO TABS
300.0000 mg | ORAL_TABLET | Freq: Once | ORAL | Status: AC
  Administered 2019-01-26: 300 mg via ORAL
  Filled 2019-01-26 (×2): qty 1

## 2019-01-26 MED ORDER — ARIPIPRAZOLE 5 MG PO TABS
5.0000 mg | ORAL_TABLET | Freq: Every day | ORAL | Status: DC
  Administered 2019-01-30: 5 mg via ORAL
  Filled 2019-01-26 (×6): qty 1

## 2019-01-26 NOTE — Progress Notes (Signed)
Recreation Therapy Notes  INPATIENT RECREATION THERAPY ASSESSMENT  Patient Details Name: Albert [name] MRN: [id number] DOB: 04-Jan-1976 Today's Date: 01/26/2019       Information Obtained From: Patient  Able to Participate in Assessment/Interview: Yes  Patient Presentation: Hyperverbal  Reason for Admission (Per Patient): Med Non-Compliance, Other (Comments)(Pt stated he wasn't eating well, had a miscommunication wtih his therapist; someone in AA told him he needed new medication and he gave up his medication for Alwyn Pea.)  Patient Stressors: Other (Comment)(New therapist, not sleeping and not taking medications.)  Coping Skills:   Journal, Sports, TV, Music, Exercise, Deep Breathing, Meditate, Talk, Prayer, Art, Read  Leisure Interests (2+):  Social - Friends, Individual - Reading, Garment/textile technologist - Other (Comment)(Coffee shop)  Frequency of Recreation/Participation: Other (Comment)(Daily)  Awareness of Community Resources:  Yes  Community Resources:  Coffee Shop, Warehouse manager, Other (Comment)([employer])  Current Use: Yes  If no, Barriers?:    Expressed Interest in [employer] Information: No  [employer] of Residence:  Engineer, technical sales  Patient Main Form of Transportation: Other (Comment)(Scooter)  Patient Strengths:  Recruitment consultant; Learn from others  Patient Identified Areas of Improvement:  Intuitive thought process  Patient Goal for Hospitalization:  "Communication, concerned about doctor not listening to me"  Current SI (including self-harm):  No  Current HI:  No  Current AVH: No  Staff Intervention Plan: Group Attendance, Collaborate with Interdisciplinary Treatment Team  Consent to Intern Participation: N/A     [name], LRT/CTRS  [name] A 01/26/2019, 1:55 PM

## 2019-01-26 NOTE — Progress Notes (Signed)
DAR NOTE: Patient presents with irritable affect and mood.  Denies suicidal thoughts, pain, auditory and visual hallucinations.  Verbally aggressive towards staff.  Described energy level as normal and concentration as good.  Rates depression at 0, hopelessness at 0, and anxiety at 0.  Maintained on routine safety checks.  Medications given as prescribed.  Support and encouragement offered as needed.  Attended group and participated.  States goal for today is "go to group."  Patient visible in milieu with minimal interaction.  Offered no complaint.

## 2019-01-26 NOTE — Progress Notes (Signed)
Recreation Therapy Notes  Date: 3.30.20 Time: 1000 Location: 500 Hall Dayroom  Group Topic: Self-Esteem  Goal Area(s) Addresses:  Patient will successfully identify positive attributes about themselves.  Patient will successfully identify benefit of improved self-esteem.   Behavioral Response: Engaged  Intervention: Worksheet, pencils  Activity: My Conservator, museum/gallery.  Patients were given worksheets with 8 categories.  Patients were to identify 3 positive things about themselves for each category.  During processing, patients would share their top 3 categories with the group.  Education:  Self-Esteem, Building control surveyor.   Education Outcome: Acknowledges education/In group clarification offered/Needs additional education  Clinical Observations/Feedback: Pt stated he was good at soccer; make others happy by doing block parties and playing piano; and help others by doing volunteer work.    [name], LRT/CTRS     Lillia Abed, Sadaf Przybysz A 01/26/2019 1:05 PM

## 2019-01-26 NOTE — Tx Team (Signed)
Interdisciplinary Treatment and Diagnostic Plan Update  01/26/2019 Time of Session: 0905 Albert [name] MRN: [id number]  Principal Diagnosis: <principal problem not specified>  Secondary Diagnoses: Active Problems:   Schizoaffective disorder, bipolar type (HCC)   Schizoaffective disorder (HCC)   Bipolar disorder ([place])   Current Medications:  Current Facility-Administered Medications  Medication Dose Route Frequency Provider Last Rate Last Dose  . acetaminophen (TYLENOL) tablet 650 mg  650 mg Oral Q6H PRN Sharma Covert, MD      . alum & mag hydroxide-simeth (MAALOX/MYLANTA) 200-200-20 MG/5ML suspension 30 mL  30 mL Oral Q4H PRN Johnn Hai, MD   30 mL at 01/26/19 1221  . ARIPiprazole (ABILIFY) tablet 5 mg  5 mg Oral Daily Johnn Hai, MD      . divalproex (DEPAKOTE) DR tablet 1,500 mg  1,500 mg Oral QHS Johnn Hai, MD   1,500 mg at 01/25/19 2116  . divalproex (DEPAKOTE) DR tablet 500 mg  500 mg Oral Q1200 Johnn Hai, MD   500 mg at 01/26/19 1221  . hydrOXYzine (ATARAX/VISTARIL) tablet 25 mg  25 mg Oral TID PRN Sharma Covert, MD      . ziprasidone (GEODON) injection 20 mg  20 mg Intramuscular Q12H PRN Sharma Covert, MD       And  . LORazepam (ATIVAN) tablet 1 mg  1 mg Oral PRN Sharma Covert, MD      . magnesium hydroxide (MILK OF MAGNESIA) suspension 30 mL  30 mL Oral Daily PRN Sharma Covert, MD      . QUEtiapine (SEROQUEL) tablet 400 mg  400 mg Oral QHS Sharma Covert, MD      . traZODone (DESYREL) tablet 50 mg  50 mg Oral QHS PRN Sharma Covert, MD       PTA Medications: Medications Prior to Admission  Medication Sig Dispense Refill Last Dose  . albuterol (PROVENTIL HFA;VENTOLIN HFA) 108 (90 Base) MCG/ACT inhaler Inhale 2 puffs into the lungs every 4 (four) hours as needed for wheezing or shortness of breath (or coughing). 1 Inhaler 0 Past Week at Unknown time  . divalproex (DEPAKOTE ER) 500 MG 24 hr tablet 1 in am 3 at h s (Patient taking  differently: Take 500-1,500 mg by mouth See admin instructions. 1 in am 3 at bedtime) 120 tablet 2 Past Week at Unknown time  . gabapentin (NEURONTIN) 300 MG capsule Take 1 capsule (300 mg total) by mouth 3 (three) times daily at 8am, 3pm and bedtime. 30 capsule 0 Past Week at Unknown time  . omeprazole (PRILOSEC) 40 MG capsule Take 40 mg by mouth daily.  0 Past Week at Unknown time  . pantoprazole (PROTONIX) 40 MG tablet Take 1 tablet (40 mg total) by mouth 2 (two) times daily before a meal. 60 tablet 0 Past Week at Unknown time  . QUEtiapine (SEROQUEL) 200 MG tablet Take 1 tablet (200 mg total) by mouth at bedtime. 30 tablet 2 Past Week at Unknown time  . QUEtiapine (SEROQUEL) 50 MG tablet Take 1 tablet (50 mg total) by mouth 3 (three) times daily as needed (agitation). 30 tablet 2 Past Month at Unknown time  . traZODone (DESYREL) 100 MG tablet Take 2 tablets (200 mg total) by mouth at bedtime as needed for sleep. 60 tablet 2 Past Month at Unknown time    Patient Stressors: Other: UTA  Patient Strengths: Other: UTA  Treatment Modalities: Medication Management, Group therapy, Case management,  1 to 1 session with clinician, Psychoeducation, Recreational  therapy.   Physician Treatment Plan for Primary Diagnosis: <principal problem not specified> Long Term Goal(s): Improvement in symptoms so as ready for discharge Improvement in symptoms so as ready for discharge   Short Term Goals: Compliance with prescribed medications will improve Ability to disclose and discuss suicidal ideas  Medication Management: Evaluate patient's response, side effects, and tolerance of medication regimen.  Therapeutic Interventions: 1 to 1 sessions, Unit Group sessions and Medication administration.  Evaluation of Outcomes: Not Met  Physician Treatment Plan for Secondary Diagnosis: Active Problems:   Schizoaffective disorder, bipolar type ([place])   Schizoaffective disorder ([place])   Bipolar disorder  ([place])  Long Term Goal(s): Improvement in symptoms so as ready for discharge Improvement in symptoms so as ready for discharge   Short Term Goals: Compliance with prescribed medications will improve Ability to disclose and discuss suicidal ideas     Medication Management: Evaluate patient's response, side effects, and tolerance of medication regimen.  Therapeutic Interventions: 1 to 1 sessions, Unit Group sessions and Medication administration.  Evaluation of Outcomes: Not Met   RN Treatment Plan for Primary Diagnosis: <principal problem not specified> Long Term Goal(s): Knowledge of disease and therapeutic regimen to maintain health will improve  Short Term Goals: Ability to identify and develop effective coping behaviors will improve and Compliance with prescribed medications will improve  Medication Management: RN will administer medications as ordered by provider, will assess and evaluate patient's response and provide education to patient for prescribed medication. RN will report any adverse and/or side effects to prescribing provider.  Therapeutic Interventions: 1 on 1 counseling sessions, Psychoeducation, Medication administration, Evaluate responses to treatment, Monitor vital signs and CBGs as ordered, Perform/monitor CIWA, COWS, AIMS and Fall Risk screenings as ordered, Perform wound care treatments as ordered.  Evaluation of Outcomes: Not Met   LCSW Treatment Plan for Primary Diagnosis: <principal problem not specified> Long Term Goal(s): Safe transition to appropriate next level of care at discharge, Engage patient in therapeutic group addressing interpersonal concerns.  Short Term Goals: Engage patient in aftercare planning with referrals and resources, Increase social support and Increase skills for wellness and recovery  Therapeutic Interventions: Assess for all discharge needs, 1 to 1 time with Social worker, Explore available resources and support systems, Assess for  adequacy in community support network, Educate family and significant other(s) on suicide prevention, Complete Psychosocial Assessment, Interpersonal group therapy.  Evaluation of Outcomes: Not Met   Progress in Treatment: Attending groups: Yes Participating in groups: Yes. Taking medication as prescribed: Yes. Toleration medication: Yes. Family/Significant other contact made: No, will contact:  pt declined consent Patient understands diagnosis: No. Discussing patient identified problems/goals with staff: Yes. Medical problems stabilized or resolved: Yes. Denies suicidal/homicidal ideation: Yes. Issues/concerns per patient self-inventory: No. Other: none  New problem(s) identified: No, Describe:  none  New Short Term/Long Term Goal(s):  Patient Goals:  "my goal is on my sheet"  Discharge Plan or Barriers:   Reason for Continuation of Hospitalization: Delusions  Medication stabilization  Estimated Length of Stay: 3-5 days.  Attendees: Patient: Albert [name] 01/26/2019   Physician: Dr. Jake Samples, MD 01/26/2019   Nursing: Sena Hitch, RN 01/26/2019   RN Care Manager: 01/26/2019   Social Worker: Lurline Idol, LCSW 01/26/2019   Recreational Therapist:  01/26/2019   Other:  01/26/2019   Other:  01/26/2019   Other: 01/26/2019     Scribe for Treatment Team: Joanne Chars, LCSW 01/26/2019 1:52 PM

## 2019-01-26 NOTE — Progress Notes (Signed)
[facility] MD Progress Note  01/26/2019 10:23 AM Albert [name]  MRN:  [id number] Subjective:    Patient remains irritable and argumentative therefore its very difficult interview him due to his mania every question results and just an argument he states that Seroquel works fine for him and when I challenged him as to why he is been hospitalized twice while reporting compliance on Seroquel he keeps insisting that is working and will not do anything but argue the point.  He clearly needs a long-acting injectable including something different than quetiapine but again he is very difficult interview denies wanting to harm self or others no EPS or TD Principal Problem: Exacerbation underlying bipolar type condition/schizoaffective condition Diagnosis: Active Problems:   Schizoaffective disorder, bipolar type (HCC)   Schizoaffective disorder (HCC)   Bipolar disorder (HCC)  Total Time spent with patient: 20 minutes  Past Medical History:  Past Medical History:  Diagnosis Date  . Alcoholism (HCC)   . Drug abuse (HCC)    xanax addiction 5 years ago  . Medical non-compliance   . Paranoid schizophrenia (HCC)   . Schizophrenia (HCC)   . Seizures (HCC)    History reviewed. No pertinent surgical history. Family History:  Family History  Problem Relation Age of Onset  . Thyroid disease Mother   . Depression Maternal Aunt     Social History:  Social History   Substance and Sexual Activity  Alcohol Use Not Currently     Social History   Substance and Sexual Activity  Drug Use Not Currently  . Types: Marijuana   Comment: xanax- several years ago    Social History   Socioeconomic History  . Marital status: Single    Spouse name: Not on file  . Number of children: Not on file  . Years of education: Not on file  . Highest education level: Not on file  Occupational History  . Not on file  Social Needs  . Financial resource strain: Not on file  . Food insecurity:    Worry: Not on  file    Inability: Not on file  . Transportation needs:    Medical: Not on file    Non-medical: Not on file  Tobacco Use  . Smoking status: Never Smoker  . Smokeless tobacco: Never Used  . Tobacco comment: Non-smoker  Substance and Sexual Activity  . Alcohol use: Not Currently  . Drug use: Not Currently    Types: Marijuana    Comment: xanax- several years ago  . Sexual activity: Not Currently  Lifestyle  . Physical activity:    Days per week: Not on file    Minutes per session: Not on file  . Stress: Not on file  Relationships  . Social connections:    Talks on phone: Not on file    Gets together: Not on file    Attends religious service: Not on file    Active member of club or organization: Not on file    Attends meetings of clubs or organizations: Not on file    Relationship status: Not on file  Other Topics Concern  . Not on file  Social History Narrative   ** Merged History Encounter **       Additional Social History:                         Sleep: Good  Appetite:  Good  Current Medications: Current Facility-Administered Medications  Medication Dose Route Frequency Provider Last Rate  Last Dose  . acetaminophen (TYLENOL) tablet 650 mg  650 mg Oral Q6H PRN Antonieta Pert, MD      . alum & mag hydroxide-simeth (MAALOX/MYLANTA) 200-200-20 MG/5ML suspension 30 mL  30 mL Oral Q4H PRN Malvin Johns, MD      . ARIPiprazole (ABILIFY) tablet 5 mg  5 mg Oral Daily Malvin Johns, MD      . divalproex (DEPAKOTE) DR tablet 1,500 mg  1,500 mg Oral QHS Malvin Johns, MD   1,500 mg at 01/25/19 2116  . divalproex (DEPAKOTE) DR tablet 500 mg  500 mg Oral Q1200 Malvin Johns, MD   500 mg at 01/25/19 1120  . hydrOXYzine (ATARAX/VISTARIL) tablet 25 mg  25 mg Oral TID PRN Antonieta Pert, MD      . ziprasidone (GEODON) injection 20 mg  20 mg Intramuscular Q12H PRN Antonieta Pert, MD       And  . LORazepam (ATIVAN) tablet 1 mg  1 mg Oral PRN Antonieta Pert, MD       . magnesium hydroxide (MILK OF MAGNESIA) suspension 30 mL  30 mL Oral Daily PRN Antonieta Pert, MD      . QUEtiapine (SEROQUEL) tablet 400 mg  400 mg Oral QHS Antonieta Pert, MD      . traZODone (DESYREL) tablet 50 mg  50 mg Oral QHS PRN Antonieta Pert, MD        Lab Results: No results found for this or any previous visit (from the past 48 hour(s)).  Blood Alcohol level:  Lab Results  Component Value Date   ETH <10 01/22/2019   ETH <10 10/30/2018    Metabolic Disorder Labs: Lab Results  Component Value Date   HGBA1C 5.1 01/05/2017   MPG 100 01/05/2017   Lab Results  Component Value Date   PROLACTIN 30.4 (H) 01/06/2017   PROLACTIN 13.1 01/05/2017   Lab Results  Component Value Date   CHOL 172 01/05/2017   TRIG 112 01/05/2017   HDL 41 01/05/2017   CHOLHDL 4.2 01/05/2017   VLDL 22 01/05/2017   LDLCALC 109 (H) 01/05/2017    Physical Findings: AIMS: Facial and Oral Movements Muscles of Facial Expression: None, normal Lips and Perioral Area: None, normal Jaw: None, normal Tongue: None, normal,Extremity Movements Upper (arms, wrists, hands, fingers): None, normal Lower (legs, knees, ankles, toes): None, normal, Trunk Movements Neck, shoulders, hips: None, normal, Overall Severity Severity of abnormal movements (highest score from questions above): None, normal Incapacitation due to abnormal movements: None, normal Patient's awareness of abnormal movements (rate only patient's report): No Awareness, Dental Status Current problems with teeth and/or dentures?: No Does patient usually wear dentures?: No  CIWA:  CIWA-Ar Total: 0 COWS:  COWS Total Score: 2  Musculoskeletal: Strength & Muscle Tone: within normal limits Gait & Station: normal Patient leans: N/A  Psychiatric Specialty Exam: Physical Exam  ROS  Blood pressure 115/77, pulse (!) 126, temperature (!) 97.4 F (36.3 C), temperature source Oral, resp. rate 18, height 5\' 10"  (1.778 m), weight 97.1  kg.Body mass index is 30.71 kg/m.  General Appearance: Casual  Eye Contact:  Poor  Speech:  Pressured  Volume:  Increased  Mood:  Angry and Irritable  Affect:  Congruent  Thought Process:  Irrelevant  Orientation:  Full (Time, Place, and Person)  Thought Content:  Tangential  Suicidal Thoughts:  No  Homicidal Thoughts:  No  Memory:  Immediate;   Fair  Judgement:  Impaired  Insight:  Lacking  Psychomotor Activity:  Normal  Concentration:  Concentration: Poor  Recall:  Poor  Fund of Knowledge:  Poor  Language:  Poor  Akathisia:  Negative  Handed:  Right  AIMS (if indicated):     Assets:  Leisure Time Physical Health  ADL's:  Intact  Cognition:  WNL  Sleep:  Number of Hours: 6.25     Treatment Plan Summary: Daily contact with patient to assess and evaluate symptoms and progress in treatment, Medication management and Plan Give doses of aripiprazole hopefully will comply clearly not doing well with just the quetiapine Depakote combination continue reality-based therapy cognitive-based therapy  Masako Overall, MD 01/26/2019, 10:23 AM

## 2019-01-27 MED ORDER — PANTOPRAZOLE SODIUM 40 MG PO TBEC
40.0000 mg | DELAYED_RELEASE_TABLET | Freq: Two times a day (BID) | ORAL | Status: DC
  Administered 2019-01-27 – 2019-02-02 (×12): 40 mg via ORAL
  Filled 2019-01-27 (×15): qty 1

## 2019-01-27 MED ORDER — QUETIAPINE FUMARATE 300 MG PO TABS
300.0000 mg | ORAL_TABLET | Freq: Once | ORAL | Status: AC
  Administered 2019-01-27: 300 mg via ORAL
  Filled 2019-01-27 (×2): qty 1

## 2019-01-27 NOTE — Progress Notes (Signed)
[facility] MD Progress Note  01/27/2019 9:41 AM Albert LeftChristopher [name]  MRN:  [id number] Subjective:   Patient seen for longer than usual visit, he insists on talking "in front of other people" I explained to him that we cannot talk in front of other patients but will discuss his case in front of other staff members and we do this in the day room in front of 2 other staff members. The patient's first concern is that he only wants to take Seroquel and Depakote and no other medications, and just like his last hospitalization he wants to dictate the amount he will take. - I explained to the patient that last time we did give him the benefit of the doubt and let him dictate his own treatment and he wound up being hospitalized fairly quickly thereafter.  And he needed to face the reality that the medications he is choosing are either subtherapeutic in their dosage or simply not the right ones for his condition but he continues to argue the point. -The next issue is compliance, he states that in the past when the medications have not worked its only because he quit taking them which is not the case as he reported to me compliance when he was hospitalized both times.  At any rate I told him this argued for the need for long-acting injectable which she is now refusing. -Thus he described the situation as "his dilemma" in which he believes he knows what is best for him but I am recommending a completely different treatment plan.  He wants to talk to patient experience coordinator to discuss this. -The next subject on his list is his irritability, during our initial interview he was so irritable, argumentative and refusing to answer questions and basically telling me to "read it from my clipboard" that he really was not participating in the mental status exam.  As I dictated previously each question which is a springboard for his argumentative and irritable statements, therefore I saw this as a symptom of his mania -I  explained to him that I did not take it personally but I do see it is a function of his mania and he states that it is not a function of his mania to be irritable. Again he is lacking insight into the specific symptoms of his condition  No EPS or TD denies wanting to harm self or others  Principal Problem: <principal problem not specified> Diagnosis: Active Problems:   Schizoaffective disorder, bipolar type (HCC)   Schizoaffective disorder (HCC)   Bipolar disorder (HCC)  Total Time spent with patient: 30 minutes  Past Medical History:  Past Medical History:  Diagnosis Date  . Alcoholism (HCC)   . Drug abuse (HCC)    xanax addiction 5 years ago  . Medical non-compliance   . Paranoid schizophrenia (HCC)   . Schizophrenia (HCC)   . Seizures (HCC)    History reviewed. No pertinent surgical history. Family History:  Family History  Problem Relation Age of Onset  . Thyroid disease Mother   . Depression Maternal Aunt    Social History:  Social History   Substance and Sexual Activity  Alcohol Use Not Currently     Social History   Substance and Sexual Activity  Drug Use Not Currently  . Types: Marijuana   Comment: xanax- several years ago    Social History   Socioeconomic History  . Marital status: Single    Spouse name: Not on file  . Number of children: Not on  file  . Years of education: Not on file  . Highest education level: Not on file  Occupational History  . Not on file  Social Needs  . Financial resource strain: Not on file  . Food insecurity:    Worry: Not on file    Inability: Not on file  . Transportation needs:    Medical: Not on file    Non-medical: Not on file  Tobacco Use  . Smoking status: Never Smoker  . Smokeless tobacco: Never Used  . Tobacco comment: Non-smoker  Substance and Sexual Activity  . Alcohol use: Not Currently  . Drug use: Not Currently    Types: Marijuana    Comment: xanax- several years ago  . Sexual activity: Not  Currently  Lifestyle  . Physical activity:    Days per week: Not on file    Minutes per session: Not on file  . Stress: Not on file  Relationships  . Social connections:    Talks on phone: Not on file    Gets together: Not on file    Attends religious service: Not on file    Active member of club or organization: Not on file    Attends meetings of clubs or organizations: Not on file    Relationship status: Not on file  Other Topics Concern  . Not on file  Social History Narrative   ** Merged History Encounter **       Additional Social History:                         Sleep: Good  Appetite:  Fair  Current Medications: Current Facility-Administered Medications  Medication Dose Route Frequency Provider Last Rate Last Dose  . acetaminophen (TYLENOL) tablet 650 mg  650 mg Oral Q6H PRN Antonieta Pert, MD      . alum & mag hydroxide-simeth (MAALOX/MYLANTA) 200-200-20 MG/5ML suspension 30 mL  30 mL Oral Q4H PRN Malvin Johns, MD   30 mL at 01/26/19 1221  . ARIPiprazole (ABILIFY) tablet 5 mg  5 mg Oral Daily Malvin Johns, MD      . divalproex (DEPAKOTE) DR tablet 1,500 mg  1,500 mg Oral QHS Malvin Johns, MD   1,500 mg at 01/26/19 2125  . divalproex (DEPAKOTE) DR tablet 500 mg  500 mg Oral Q1200 Malvin Johns, MD   500 mg at 01/26/19 1221  . hydrOXYzine (ATARAX/VISTARIL) tablet 25 mg  25 mg Oral TID PRN Antonieta Pert, MD      . ziprasidone (GEODON) injection 20 mg  20 mg Intramuscular Q12H PRN Antonieta Pert, MD       And  . LORazepam (ATIVAN) tablet 1 mg  1 mg Oral PRN Antonieta Pert, MD      . magnesium hydroxide (MILK OF MAGNESIA) suspension 30 mL  30 mL Oral Daily PRN Antonieta Pert, MD      . QUEtiapine (SEROQUEL) tablet 400 mg  400 mg Oral QHS Antonieta Pert, MD      . traZODone (DESYREL) tablet 50 mg  50 mg Oral QHS PRN Antonieta Pert, MD        Lab Results: No results found for this or any previous visit (from the past 48  hour(s)).  Blood Alcohol level:  Lab Results  Component Value Date   [facility] <10 01/22/2019   ETH <10 10/30/2018    Metabolic Disorder Labs: Lab Results  Component Value Date   HGBA1C 5.1 01/05/2017  MPG 100 01/05/2017   Lab Results  Component Value Date   PROLACTIN 30.4 (H) 01/06/2017   PROLACTIN 13.1 01/05/2017   Lab Results  Component Value Date   CHOL 172 01/05/2017   TRIG 112 01/05/2017   HDL 41 01/05/2017   CHOLHDL 4.2 01/05/2017   VLDL 22 01/05/2017   LDLCALC 109 (H) 01/05/2017    Physical Findings: AIMS: Facial and Oral Movements Muscles of Facial Expression: None, normal Lips and Perioral Area: None, normal Jaw: None, normal Tongue: None, normal,Extremity Movements Upper (arms, wrists, hands, fingers): None, normal Lower (legs, knees, ankles, toes): None, normal, Trunk Movements Neck, shoulders, hips: None, normal, Overall Severity Severity of abnormal movements (highest score from questions above): None, normal Incapacitation due to abnormal movements: None, normal Patient's awareness of abnormal movements (rate only patient's report): No Awareness, Dental Status Current problems with teeth and/or dentures?: No Does patient usually wear dentures?: No  CIWA:  CIWA-Ar Total: 0 COWS:  COWS Total Score: 2  Musculoskeletal: Strength & Muscle Tone: within normal limits Gait & Station: normal Patient leans: N/A  Psychiatric Specialty Exam: Physical Exam  ROS  Blood pressure 104/75, pulse (!) 137, temperature (!) 97.5 F (36.4 C), temperature source Oral, resp. rate 18, height 5\' 10"  (1.778 m), weight 97.1 kg.Body mass index is 30.71 kg/m.  General Appearance: Disheveled  Eye Contact:  Minimal  Speech:  Pressured  Volume:  Normal  Mood:  Irritable  Affect:  Restricted  Thought Process:  Descriptions of Associations: Tangential  Orientation:  Full (Time, Place, and Person)  Thought Content:  Illogical and Tangential  Suicidal Thoughts:  No  Homicidal  Thoughts:  No  Memory:  Immediate;   Fair  Judgement:  Impaired  Insight:  Lacking  Psychomotor Activity:  Normal  Concentration:  Concentration: Fair  Recall:  [employer] of Knowledge:  Fair  Language:  Fair  Akathisia:  Negative  Handed:  Right  AIMS (if indicated):     Assets:  Physical Health Resilience  ADL's:  Intact  Cognition:  WNL  Sleep:  Number of Hours: 5.75     Treatment Plan Summary: Daily contact with patient to assess and evaluate symptoms and progress in treatment, Medication management and Plan Numerous issues discussed where we [name] it with that I simply cannot let him dictate his treatment plan again and wind up being hospitalized again particular since he is still symptomatic irritable and lacking insight into the specifics of his condition.  I strongly encouraged him to take a long-acting injectable as I discussed with the treatment team and discussed with him we think this is the best chance for his recovery-elaborated that without long-acting medication, his continued insistence on subtherapeutic or not effective doses of medications which is resulted in further hospitalizations  Bryan Omura, MD 01/27/2019, 9:41 AM

## 2019-01-27 NOTE — [facility] Group Notes (Signed)
[facility] LCSW Group Therapy Note  Date/Time: 01/27/19, 1100  Type of Therapy/Topic:  Group Therapy:  Feelings about Diagnosis  Participation Level:  Minimal   Mood: reserved   Description of Group:    This group will allow patients to explore their thoughts and feelings about diagnoses they have received. Patients will be guided to explore their level of understanding and acceptance of these diagnoses. Facilitator will encourage patients to process their thoughts and feelings about the reactions of others to their diagnosis, and will guide patients in identifying ways to discuss their diagnosis with significant others in their lives. This group will be process-oriented, with patients participating in exploration of their own experiences as well as giving and receiving support and challenge from other group members.   Therapeutic Goals: 1. Patient will demonstrate understanding of diagnosis as evidence by identifying two or more symptoms of the disorder:  2. Patient will be able to express two feelings regarding the diagnosis 3. Patient will demonstrate ability to communicate their needs through discussion and/or role plays  Summary of Patient Progress:Pt attentive in group today but made no comments with the exception of answering several questions directed to him by CSW.  Pt continues to be quite reserved--stated that his diagnosis was his personal business and not something he wanted to share with the group.  CSW validated this sentiment but was not able to get pt more involved in the discussion.          Therapeutic Modalities:   Cognitive Behavioral Therapy Brief Therapy Feelings Identification   Daleen Squibb, LCSW

## 2019-01-27 NOTE — Plan of Care (Signed)
D: Patient is alert, argumentative, irritable, and labile. Denies SI, HI, AVH, and verbally contracts for safety. Patient denies physical symptoms/pain. He spends much of the evening watching TV.  Patient is irritable and has tears in his eyes when engaged in conversation. Patient states "the doctor is telling me I'm irritable and then ending the conversation", "I didn't get to  talk to him about the Seroquel".   This RN informed the patient that his prescribed Seroquel dose for the evening is 400 mg. He states he will only take 300 mg because "I didn't get to talk to the doctor about the Seroquel".   He wants to know "who is in charge of the doctor". Patients speech is tangential and pressured. This RN informed him that the best thing to do is to be calm and try to get his points across when speaking with the doctor tomorrow. He states "there is a problem and I need to talk to who is above the doctor". This RN informed him if he has a complaint about his care he can contact patient experience.   One time dose for 300 mg Seroquel obtained form on call provider.    A: Other medications administered per MD order. Support provided. Patient educated on safety on the unit and medications. Routine safety checks every 15 minutes. Patient stated understanding to tell nurse about any new physical symptoms. Patient understands to tell staff of any needs.     R: No adverse drug reactions noted. Patient verbally contracts for safety. Patient remains safe at this time and will continue to monitor.   Problem: Safety: Goal: Periods of time without injury will increase Outcome: Progressing   Patient remains safe and will continue to monitor.

## 2019-01-27 NOTE — Progress Notes (Signed)
Recreation Therapy Notes  Date: 3.31.20 Time: 1000 Location: 500 Hall Dayroom  Group Topic: Wellness  Goal Area(s) Addresses:  Patient will define components of whole wellness. Patient will verbalize benefit of whole wellness.  Behavioral Response: Engaged  Intervention: [employer], Music, Stopwatch  Activity: Keep It Contractor.  Patients were seated in a semi circle, at an arms length apart.  Patients were to toss the ball back and forth to each other.  Patients would be timed to see how long they could keep the ball going.  The ball could bounce off the floor but if it stopped at any point the ball stopped moving, the timer would be reset.  Education: Wellness, Building control surveyor.   Education Outcome: Acknowledges education/In group clarification offered/Needs additional education.   Clinical Observations/Feedback: Pt was focused and on task throughout activity.  Pt would give suggestions to peers on how to keep the ball in play.  Pt was appropriate throughout activity and remained for duration of group.    [name], LRT/CTRS      [name] A 01/27/2019 10:56 AM

## 2019-01-28 MED ORDER — ARIPIPRAZOLE ER 400 MG IM SRER: 400.0000 mg | INTRAMUSCULAR | Status: DC

## 2019-01-28 MED ORDER — QUETIAPINE FUMARATE 300 MG PO TABS
300.0000 mg | ORAL_TABLET | Freq: Once | ORAL | Status: AC
  Administered 2019-01-28: 300 mg via ORAL
  Filled 2019-01-28 (×2): qty 1

## 2019-01-28 NOTE — Progress Notes (Signed)
CSW met with pt in AM to discuss discharge plan.  Pt reports he is not allowed to see Dr Reece Levy any more for psychiatry, willing to try Dr Altamese Coalgate.  Pt also reports his therapist is [facility] but Mr. Lysle Morales contacted pt about doing phone therapy sessions and pt is not interested in doing this, so he does not want to schedule an appt for therapy right now.  CSW contacted [facility] and Dr. Reece Levy is able to meet with pt, appt scheduled for 4/14.  CSW discussed this with pt who said this would be fine.   Winferd Humphrey, MSW, LCSW Clinical Social Worker 01/28/2019 2:36 PM

## 2019-01-28 NOTE — Plan of Care (Signed)
D: Patient is alert, irritable, and labile. Denies SI, HI, AVH, and verbally contracts for safety. Patient denies physical symptoms/pain.   Patient still refusing 400 mg Seroquel. One time dose of 300 mg obtained from provider on call again.   A: Other medications administered per MD order. Support provided. Patient educated on safety on the unit and medications. Routine safety checks every 15 minutes. Patient stated understanding to tell nurse about any new physical symptoms. Patient understands to tell staff of any needs.     R: No adverse drug reactions noted. Patient verbally contracts for safety. Patient remains safe at this time and will continue to monitor.   Problem: Safety: Goal: Periods of time without injury will increase Outcome: Progressing   Patient remains safe and will continue to monitor.

## 2019-01-28 NOTE — Progress Notes (Signed)
Patient was calm upon approach to the medication window. Patient refused Abilify 5 this morning and said "I am refusing this because the doctor hasn't spoken with me about it. I need to know what I'm taking and what the dosage is. He hasn't talked with me about it." Writer asked if patient was hearing any voices that weren't really there. Patient said "....what do you mean? Don't we all have an internal monologue? I hear your voice, I hear my voice. Am I supposed to be hearing any other voices? Should I be hearing something else?" Patient was obviously being sarcastic and rude to Clinical research associate. MD notified.

## 2019-01-28 NOTE — Progress Notes (Signed)
Recreation Therapy Notes  Date: 4.1.20 Time: 1000 Location: 500 Hall Dayroom  Group Topic: Coping Skills  Goal Area(s) Addresses:  Patient will identify positive and negative coping skills. Patient will identify benefits of positive coping skills. Patient will identify consequences of negative coping skills.  Behavioral Response:  Minimal  Intervention:  Worksheet, pencils  Activity:  Healthy vs. Unhealthy Coping Strategies.  Patients were to identify a problem they are currently dealing with.  Patients then identified the unhealthy coping strategies they used when dealing with that problem and the consequences.  Patients also identified positive coping strategies, the benefits and the barriers that prevent them from using these coping strategies.  Education: Pharmacologist, Building control surveyor.   Education Outcome: Acknowledges understanding/In group clarification offered/Needs additional education.   Clinical Observations/Feedback: Pt became a little argumentative after the initial discussion about the group topic.  Pt wanted to do things his way.  Pt eventually completed some of the sheet and shared with group.  Pt identified no problem.  Pt identified his healthy coping strategies as staying connected; expected outcome was having victory over his difficulties and the barrier were "people trying to step in and feeling like people don't want you to get better".       Albert [name], LRT/CTRS     Albert [name], Albert [name] A 01/28/2019 11:12 AM

## 2019-01-28 NOTE — [facility] Suicide Risk Assessment (Signed)
[facility] INPATIENT:  Family/Significant Other Suicide Prevention Education  Suicide Prevention Education:  Patient Refusal for Family/Significant Other Suicide Prevention Education: The patient Albert [name] has refused to provide written consent for family/significant other to be provided Family/Significant Other Suicide Prevention Education during admission and/or prior to discharge.  Physician notified.  Lorri Frederick, LCSW 01/28/2019, 2:33 PM

## 2019-01-28 NOTE — Plan of Care (Signed)
Patient self inventory- Patient slept well last night, sleep medication was not requested. Appetite is good, energy level normal, concentration good. Depression, hopelessness,. And anxiety all rated 0/10. Denies SI HI AVH. Denies physical pain. Patient's goal is "go to group today."  Patient is not compliant with medications. MD made aware. Safety is maintained with 15 minute checks as well as environmental checks. Will continue to monitor and provide support.  Problem: Education: Goal: Emotional status will improve Outcome: Progressing Goal: Mental status will improve Outcome: Progressing Goal: Verbalization of understanding the information provided will improve Outcome: Progressing   Problem: Activity: Goal: Interest or engagement in activities will improve Outcome: Progressing

## 2019-01-28 NOTE — Progress Notes (Signed)
[facility] MD Progress Note  01/28/2019 10:40 AM Cotton Poirot  MRN:  [id number] Subjective:   Patient is more cordial with me today but he is still verbally abusive and irritable with staff.  He once again wants to argue that he knows what medications are best for him and yet we are going over the same ground again, the fact that he still wants to dictate treatment that has not worked previously.  He still has manic symptoms main manifestation being irritability and poor insight but we will again continue to offer long-acting injectable. I told him I am not interested in having a continual power struggle with him over what is best for him, that he needs to take our recommendations, at some point we will discharge him with or without long-acting injectable however, but I expressed to him in the strongest language that I did not believe Seroquel was going to be a effective medication for him going forward as it is not proven to be so thus far  Principal Problem: Schizoaffective bipolar type complicated by his insistence on dictating his own treatment plan Diagnosis: Active Problems:   Schizoaffective disorder, bipolar type (HCC)   Schizoaffective disorder (HCC)   Bipolar disorder (HCC)  Total Time spent with patient: 30 minutes  Past Medical History:  Past Medical History:  Diagnosis Date  . Alcoholism (HCC)   . Drug abuse (HCC)    xanax addiction 5 years ago  . Medical non-compliance   . Paranoid schizophrenia (HCC)   . Schizophrenia (HCC)   . Seizures (HCC)    History reviewed. No pertinent surgical history. Family History:  Family History  Problem Relation Age of Onset  . Thyroid disease Mother   . Depression Maternal Aunt    Social History:  Social History   Substance and Sexual Activity  Alcohol Use Not Currently     Social History   Substance and Sexual Activity  Drug Use Not Currently  . Types: Marijuana   Comment: xanax- several years ago    Social History    Socioeconomic History  . Marital status: Single    Spouse name: Not on file  . Number of children: Not on file  . Years of education: Not on file  . Highest education level: Not on file  Occupational History  . Not on file  Social Needs  . Financial resource strain: Not on file  . Food insecurity:    Worry: Not on file    Inability: Not on file  . Transportation needs:    Medical: Not on file    Non-medical: Not on file  Tobacco Use  . Smoking status: Never Smoker  . Smokeless tobacco: Never Used  . Tobacco comment: Non-smoker  Substance and Sexual Activity  . Alcohol use: Not Currently  . Drug use: Not Currently    Types: Marijuana    Comment: xanax- several years ago  . Sexual activity: Not Currently  Lifestyle  . Physical activity:    Days per week: Not on file    Minutes per session: Not on file  . Stress: Not on file  Relationships  . Social connections:    Talks on phone: Not on file    Gets together: Not on file    Attends religious service: Not on file    Active member of club or organization: Not on file    Attends meetings of clubs or organizations: Not on file    Relationship status: Not on file  Other Topics Concern  .  Not on file  Social History Narrative   ** Merged History Encounter **       Additional Social History:                         Sleep: Good  Appetite:  Fair  Current Medications: Current Facility-Administered Medications  Medication Dose Route Frequency Provider Last Rate Last Dose  . acetaminophen (TYLENOL) tablet 650 mg  650 mg Oral Q6H PRN Antonieta Pert, MD      . alum & mag hydroxide-simeth (MAALOX/MYLANTA) 200-200-20 MG/5ML suspension 30 mL  30 mL Oral Q4H PRN Malvin Johns, MD   30 mL at 01/27/19 1043  . ARIPiprazole (ABILIFY) tablet 5 mg  5 mg Oral Daily Malvin Johns, MD      . divalproex (DEPAKOTE) DR tablet 1,500 mg  1,500 mg Oral QHS Malvin Johns, MD   1,500 mg at 01/27/19 2104  . divalproex (DEPAKOTE)  DR tablet 500 mg  500 mg Oral Q1200 Malvin Johns, MD   500 mg at 01/27/19 1259  . hydrOXYzine (ATARAX/VISTARIL) tablet 25 mg  25 mg Oral TID PRN Antonieta Pert, MD      . ziprasidone (GEODON) injection 20 mg  20 mg Intramuscular Q12H PRN Antonieta Pert, MD       And  . LORazepam (ATIVAN) tablet 1 mg  1 mg Oral PRN Antonieta Pert, MD      . magnesium hydroxide (MILK OF MAGNESIA) suspension 30 mL  30 mL Oral Daily PRN Antonieta Pert, MD      . pantoprazole (PROTONIX) EC tablet 40 mg  40 mg Oral BID Malvin Johns, MD   40 mg at 01/28/19 0747  . QUEtiapine (SEROQUEL) tablet 400 mg  400 mg Oral QHS Antonieta Pert, MD      . traZODone (DESYREL) tablet 50 mg  50 mg Oral QHS PRN Antonieta Pert, MD        Lab Results: No results found for this or any previous visit (from the past 48 hour(s)).  Blood Alcohol level:  Lab Results  Component Value Date   ETH <10 01/22/2019   ETH <10 10/30/2018    Metabolic Disorder Labs: Lab Results  Component Value Date   HGBA1C 5.1 01/05/2017   MPG 100 01/05/2017   Lab Results  Component Value Date   PROLACTIN 30.4 (H) 01/06/2017   PROLACTIN 13.1 01/05/2017   Lab Results  Component Value Date   CHOL 172 01/05/2017   TRIG 112 01/05/2017   HDL 41 01/05/2017   CHOLHDL 4.2 01/05/2017   VLDL 22 01/05/2017   LDLCALC 109 (H) 01/05/2017    Physical Findings: AIMS: Facial and Oral Movements Muscles of Facial Expression: None, normal Lips and Perioral Area: None, normal Jaw: None, normal Tongue: None, normal,Extremity Movements Upper (arms, wrists, hands, fingers): None, normal Lower (legs, knees, ankles, toes): None, normal, Trunk Movements Neck, shoulders, hips: None, normal, Overall Severity Severity of abnormal movements (highest score from questions above): None, normal Incapacitation due to abnormal movements: None, normal Patient's awareness of abnormal movements (rate only patient's report): No Awareness, Dental  Status Current problems with teeth and/or dentures?: No Does patient usually wear dentures?: No  CIWA:  CIWA-Ar Total: 0 COWS:  COWS Total Score: 2  Musculoskeletal: Strength & Muscle Tone: within normal limits Gait & Station: normal Patient leans: N/A  Psychiatric Specialty Exam: Physical Exam  ROS  Blood pressure 98/64, pulse (!) 118, temperature 98.1 F (  36.7 C), temperature source Oral, resp. rate 18, height  (1.778 m), weight 97.1 kg.Body mass index is 30.71 kg/m.  General Appearance: Casual  Eye Contact:  Good  Speech:  Clear and Coherent  Volume:  Normal  Mood:  Angry and Irritable  Affect:  Congruent  Thought Process:  Goal Directed  Orientation:  Full (Time, Place, and Person)  Thought Content:  Tangential  Suicidal Thoughts:  No  Homicidal Thoughts:  No  Memory:  NA Immediate;   Fair  Judgement:  Impaired  Insight:  Shallow  Psychomotor Activity:  Decreased  Concentration:  Concentration: Fair  Recall:  Fair  Fund of Knowledge:  Good  Language: Generally normal rate and tone more cordial with me than other examiners  Akathisia:  Negative  Handed:  Right  AIMS (if indicated):     Assets:  Leisure Time Physical Health Resilience  ADL's:  Intact  Cognition:  WNL  Sleep:  Number of Hours: 6.25     Treatment Plan Summary: Daily contact with patient to assess and evaluate symptoms and progress in treatment, Medication management and Plan Continue to offer long-acting injectable hopefully will comply  Madisun Hargrove, MD 01/28/2019, 10:40 AM

## 2019-01-28 NOTE — Progress Notes (Signed)
Patient was offered Abilify shot yet again. Patient said "I don't want to do this, I feel like I'm talking to someone dumb. I shouldn't have to explain this to the doctor. What if something goes wrong, then we have 28 days of 'oh shit what are we gonna do to get this medication out of me.' I don't want this. No more experimenting on Albert [name]." Patient was told he needs to speak with MD about his concerns.

## 2019-01-29 MED ORDER — PALIPERIDONE PALMITATE ER 234 MG/1.5ML IM SUSY
234.0000 mg | PREFILLED_SYRINGE | Freq: Once | INTRAMUSCULAR | Status: AC
  Administered 2019-01-29: 234 mg via INTRAMUSCULAR
  Filled 2019-01-29: qty 1.5

## 2019-01-29 NOTE — Progress Notes (Signed)
Patient is irritable and agitated.  Patient remains argumentative with the physician about his medications.  Patient did receive IM invega this shift after speaking with outpatient provider.    Assess patient for safety, offer medications as prescribed, engage patient in 1:1 staff talks.   Patient able to contract for safety, continue to monitor as planned.

## 2019-01-29 NOTE — Progress Notes (Signed)
[facility]  01/29/2019 11:26 AM Eyvonne LeftChristopher Gau  MRN:  [id number] Subjective:   Patient seen on 2 occasions usual rounds which she continued to refuse long-acting injectable medication and argue that he should dictate his own treatment, after rounds Dr. Betti Cruzeddy was kind enough to get on the phone with me and talk on speaker phone with Cristal Deerhristopher as he is known Corporate treasurerChristopher through several admissions at another facility and is currently his outpatient provider and Dr. Betti Cruzeddy encourage the patient to take a long-acting injectable stating that he had recommended numerous times in the past to include aripiprazole and inVega the patient refused and was argumentative with both myself and Dr. Betti Cruzeddy despite Dr. Reddy's excellent therapeutic manner and recommendations and calm reasons arguments but the patient himself later agreed to the long-acting injectable paliperidone so we will see if he complies Principal Problem: Chronic under treatment of a schizoaffective bipolar type condition Diagnosis: Active Problems:   Schizoaffective disorder, bipolar type (HCC)   Schizoaffective disorder (HCC)   Bipolar disorder (HCC)  Total Time spent with patient: 30 minutes  Past Medical History:  Past Medical History:  Diagnosis Date  . Alcoholism (HCC)   . Drug abuse (HCC)    xanax addiction 5 years ago  . Medical non-compliance   . Paranoid schizophrenia (HCC)   . Schizophrenia (HCC)   . Seizures (HCC)    History reviewed. No pertinent surgical history. Family History:  Family History  Problem Relation Age of Onset  . Thyroid disease Mother   . Depression Maternal Aunt     Social History:  Social History   Substance and Sexual Activity  Alcohol Use Not Currently     Social History   Substance and Sexual Activity  Drug Use Not Currently  . Types: Marijuana   Comment: xanax- several years ago    Social History   Socioeconomic History  . Marital status: Single    Spouse name: Not  on file  . Number of children: Not on file  . Years of education: Not on file  . Highest education level: Not on file  Occupational History  . Not on file  Social Needs  . Financial resource strain: Not on file  . Food insecurity:    Worry: Not on file    Inability: Not on file  . Transportation needs:    Medical: Not on file    Non-medical: Not on file  Tobacco Use  . Smoking status: Never Smoker  . Smokeless tobacco: Never Used  . Tobacco comment: Non-smoker  Substance and Sexual Activity  . Alcohol use: Not Currently  . Drug use: Not Currently    Types: Marijuana    Comment: xanax- several years ago  . Sexual activity: Not Currently  Lifestyle  . Physical activity:    Days per week: Not on file    Minutes per session: Not on file  . Stress: Not on file  Relationships  . Social connections:    Talks on phone: Not on file    Gets together: Not on file    Attends religious service: Not on file    Active member of club or organization: Not on file    Attends meetings of clubs or organizations: Not on file    Relationship status: Not on file  Other Topics Concern  . Not on file  Social History Narrative   ** Merged History Encounter **       Additional Social History:  Sleep: Good  Appetite:  Good  Current Medications: Current Facility-Administered Medications  Medication Dose Route Frequency Provider Last Rate Last Dose  . acetaminophen (TYLENOL) tablet 650 mg  650 mg Oral Q6H PRN Antonieta Pert, MD      . alum & mag hydroxide-simeth (MAALOX/MYLANTA) 200-200-20 MG/5ML suspension 30 mL  30 mL Oral Q4H PRN Malvin Johns, MD   30 mL at 01/27/19 1043  . ARIPiprazole (ABILIFY) tablet 5 mg  5 mg Oral Daily Malvin Johns, MD      . divalproex (DEPAKOTE) DR tablet 1,500 mg  1,500 mg Oral QHS Malvin Johns, MD   1,500 mg at 01/28/19 2139  . divalproex (DEPAKOTE) DR tablet 500 mg  500 mg Oral Q1200 Malvin Johns, MD   500 mg at 01/28/19  1320  . hydrOXYzine (ATARAX/VISTARIL) tablet 25 mg  25 mg Oral TID PRN Antonieta Pert, MD      . ziprasidone (GEODON) injection 20 mg  20 mg Intramuscular Q12H PRN Antonieta Pert, MD       And  . LORazepam (ATIVAN) tablet 1 mg  1 mg Oral PRN Antonieta Pert, MD      . magnesium hydroxide (MILK OF MAGNESIA) suspension 30 mL  30 mL Oral Daily PRN Antonieta Pert, MD      . paliperidone (INVEGA SUSTENNA) injection 234 mg  234 mg Intramuscular Once Malvin Johns, MD      . pantoprazole (PROTONIX) EC tablet 40 mg  40 mg Oral BID Malvin Johns, MD   40 mg at 01/29/19 0732  . QUEtiapine (SEROQUEL) tablet 400 mg  400 mg Oral QHS Antonieta Pert, MD   Stopped at 01/28/19 2052  . traZODone (DESYREL) tablet 50 mg  50 mg Oral QHS PRN Antonieta Pert, MD        Lab Results: No results found for this or any previous visit (from the past 48 hour(s)).  Blood Alcohol level:  Lab Results  Component Value Date   ETH <10 01/22/2019   ETH <10 10/30/2018    Metabolic Disorder Labs: Lab Results  Component Value Date   HGBA1C 5.1 01/05/2017   MPG 100 01/05/2017   Lab Results  Component Value Date   PROLACTIN 30.4 (H) 01/06/2017   PROLACTIN 13.1 01/05/2017   Lab Results  Component Value Date   CHOL 172 01/05/2017   TRIG 112 01/05/2017   HDL 41 01/05/2017   CHOLHDL 4.2 01/05/2017   VLDL 22 01/05/2017   LDLCALC 109 (H) 01/05/2017    Physical Findings: AIMS: Facial and Oral Movements Muscles of Facial Expression: None, normal Lips and Perioral Area: None, normal Jaw: None, normal Tongue: None, normal,Extremity Movements Upper (arms, wrists, hands, fingers): None, normal Lower (legs, knees, ankles, toes): None, normal, Trunk Movements Neck, shoulders, hips: None, normal, Overall Severity Severity of abnormal movements (highest score from questions above): None, normal Incapacitation due to abnormal movements: None, normal Patient's awareness of abnormal movements (rate only  patient's report): No Awareness, Dental Status Current problems with teeth and/or dentures?: No Does patient usually wear dentures?: No  CIWA:  CIWA-Ar Total: 0 COWS:  COWS Total Score: 2  Musculoskeletal: Strength & Muscle Tone: within normal limits Gait & Station: normal Patient leans: N/A  Psychiatric Specialty Exam: Physical Exam  ROS  Blood pressure 119/75, pulse (!) 118, temperature 98.2 F (36.8 C), resp. rate 18, height 5\' 10"  (1.778 m), weight 97.1 kg.Body mass index is 30.71 kg/m.  General Appearance: Casual  Eye Contact:  Fair  Speech:  Pressured  Volume:  Normal  Mood:  Angry and Irritable  Affect:  Congruent  Thought Process:  Linear  Orientation:  Full (Time, Place, and Person)  Thought Content:  Illogical and Tangential  Suicidal Thoughts:  No  Homicidal Thoughts:  No  Memory:  Immediate;   Fair  Judgement:  Impaired  Insight:  Shallow  Psychomotor Activity:  Normal  Concentration:  Concentration: Fair  Recall:  [employer] of Knowledge:  Fair  Language:  Good  Akathisia:  Negative  Handed:  Right  AIMS (if indicated):     Assets: Ongoing outpatient clinician contact  ADL's:  Intact  Cognition:  WNL  Sleep:  Number of Hours: 6.5     Treatment Plan Summary: Daily contact with patient to assess and evaluate symptoms and progress in treatment, Medication management and Plan As discussed saw the patient on 2 occasions the second time with Dr. Betti Cruz on speaker phone hopefully he will agree to long-acting injectable recommended by myself and his outpatient provider  Malvin Johns, MD 01/29/2019, 11:26 AM

## 2019-01-29 NOTE — Progress Notes (Signed)
D:  Albert [name] was noted sitting in the day room all evening.  He was very observant and was overly watchful with happenings on the unit.  When staff would address him, he would be loud, argumentative and demanding.  He was upset because his toilet was clogged and it wasn't fixed and "why wasn't it fixed when I asked earlier?"  He continued arguing with staff about his medication regimen.  EVS called to have his toilet fixed and one time order for Seroquel for 300mg  obtained from [facility].  He took his hs medication without difficulty after the change in medication and thanked staff for obtaining the change.  He was able to rest well this shift. A:  1:1 with RN for support and encouragement.  Medications as ordered.  Q 15 minute checks maintained for safety.  Encouraged participation in group and unit activities.   R:  Albert [name] remains safe on the unit.  We will continue to monitor the progress towards his goals.

## 2019-01-29 NOTE — [facility] Group Notes (Signed)
[facility] LCSW Group Therapy Note  Date/Time: 01/29/19, 1315  Type of Therapy/Topic:  Group Therapy:  Balance in Life  Participation Level:  active  Description of Group:    This group will address the concept of balance and how it feels and looks when one is unbalanced. Patients will be encouraged to process areas in their lives that are out of balance, and identify reasons for remaining unbalanced. Facilitators will guide patients utilizing problem- solving interventions to address and correct the stressor making their life unbalanced. Understanding and applying boundaries will be explored and addressed for obtaining  and maintaining a balanced life. Patients will be encouraged to explore ways to assertively make their unbalanced needs known to significant others in their lives, using other group members and facilitator for support and feedback.  Therapeutic Goals: 1. Patient will identify two or more emotions or situations they have that consume much of in their lives. 2. Patient will identify signs/triggers that life has become out of balance:  3. Patient will identify two ways to set boundaries in order to achieve balance in their lives:  4. Patient will demonstrate ability to communicate their needs through discussion and/or role plays  Summary of Patient Progress:Pt very active and appropriate in group today.  Shared that friends are the area of his life currently out of balance and went into some detail about people from AA not returning his calls and the frustrations this has led to.  Pt also quite appropriate in responding to another pt who was quite intrusive during group and kept interrupting him.  This was the most participation that pt has had during any groups with this CSW.          Therapeutic Modalities:   Cognitive Behavioral Therapy Solution-Focused Therapy Assertiveness Training  Daleen Squibb, [place]

## 2019-01-29 NOTE — Progress Notes (Signed)
D:  Albert [name] was in the day room much of the evening.  He continued to stare at staff through the window.  He attended evening wrap up group.  He was more cooperative this evening and was not argumentative about his hs medication.  He took the Seroquel 400mg  without difficulty.  He denied SI/HI or A/V hallucinations.  He is currently resting with his eyes closed and appears to be asleep. A:  1:1 with RN for support and encouragement.  Medications as ordered.  Q 15 minute checks maintained for safety.  Encouraged participation in group and unit activities.   R:  Albert [name] remains safe on the unit.  We will continue to monitor the progress towards his goals.

## 2019-01-29 NOTE — Progress Notes (Signed)
Recreation Therapy Notes  Date: 4.2.20 Time:  1000 Location: 500 Hall Dayroom  Group Topic: Communication, Team Building, Problem Solving  Goal Area(s) Addresses:  Patient will effectively work with peer towards shared goal.  Patient will identify skills used to make activity successful.  Patient will identify how skills used during activity can be used to reach post d/c goals.   Behavioral Response:  Minimal  Intervention: STEM Activity  Activity: Landing Pad. In teams patients were given 12 plastic drinking straws and a length of masking tape. Using the materials provided patients were asked to build a landing pad to catch a golf ball dropped from approximately 6 feet in the air.   Education: Pharmacist, community, Discharge Planning   Education Outcome: Acknowledges education/In group clarification offered/Needs additional education.   Clinical Observations/Feedback:  Pt mainly watched and observed his peers as they worked.  Pt would help when prompted by peers.  Pt was quiet and flat during group.    [name], LRT/CTRS    [name] A 01/29/2019 11:19 AM

## 2019-01-29 NOTE — [facility] Group Notes (Signed)
[facility] Group Notes:  (Nursing/MHT/Case Management/Adjunct)  Date:  01/29/2019  Time:  4:00 PM  Type of Therapy:  Nurse Education  Participation Level:  Active  Participation Quality:  Appropriate and Attentive  Affect:  Appropriate  Cognitive:  Alert and Appropriate  Insight:  Appropriate  Engagement in Group:  Engaged and Improving  Modes of Intervention:  Discussion, Education and Exploration   Summary of Progress/Problems:  pt's discussed coping mechanisms and ways to utilize these skills when they leave.  Albert [name] Albert [name] Albert [name] 01/29/2019, 5:30 PM

## 2019-01-29 NOTE — Progress Notes (Signed)
The patient commented in group that he went outside for fresh air and enjoyed himself. His goal for tomorrow is to ask the doctor about getting discharged.

## 2019-01-30 MED ORDER — QUETIAPINE FUMARATE 400 MG PO TABS
400.0000 mg | ORAL_TABLET | Freq: Every day | ORAL | Status: DC
  Administered 2019-01-30 – 2019-02-01 (×3): 400 mg via ORAL
  Filled 2019-01-30 (×4): qty 1

## 2019-01-30 MED ORDER — QUETIAPINE FUMARATE 200 MG PO TABS: 200.0000 mg | ORAL_TABLET | Freq: Every day | ORAL | Status: DC

## 2019-01-30 MED ORDER — PALIPERIDONE PALMITATE ER 156 MG/ML IM SUSY
156.0000 mg | PREFILLED_SYRINGE | Freq: Once | INTRAMUSCULAR | Status: AC
  Administered 2019-02-02: 156 mg via INTRAMUSCULAR

## 2019-01-30 MED ORDER — QUETIAPINE FUMARATE 50 MG PO TABS
50.0000 mg | ORAL_TABLET | Freq: Two times a day (BID) | ORAL | Status: DC
  Administered 2019-01-30 – 2019-02-02 (×6): 50 mg via ORAL
  Filled 2019-01-30 (×8): qty 1

## 2019-01-30 NOTE — Progress Notes (Signed)
[facility] MD Progress Note  01/30/2019 11:20 AM Albert [name]  MRN:  [id number] Subjective:    Patient is known to Dr. Betti Cruz who was kind enough to speak to the patient by speaker phone and I was on the line as well bottom line is patient did agree eventually to take his long-acting injectable as Dr. Betti Cruz had been hopeful that he would get it for some time.  Patient was still irritable through the evening but slept well he was less argumentative about meds. He will require 1 more injection prior to leaving but we will schedule that for Monday.  At the present time generally cooperative passive less irritable no EPS or TD no signs or symptoms of toxicity Principal Problem: Chronic undertreated psychotic disorder Diagnosis: Active Problems:   Schizoaffective disorder, bipolar type (HCC)   Schizoaffective disorder (HCC)   Bipolar disorder (HCC)  Total Time spent with patient: 20 minutes  Past Medical History:  Past Medical History:  Diagnosis Date  . Alcoholism (HCC)   . Drug abuse (HCC)    xanax addiction 5 years ago  . Medical non-compliance   . Paranoid schizophrenia (HCC)   . Schizophrenia (HCC)   . Seizures (HCC)    History reviewed. No pertinent surgical history. Family History:  Family History  Problem Relation Age of Onset  . Thyroid disease Mother   . Depression Maternal Aunt     Social History:  Social History   Substance and Sexual Activity  Alcohol Use Not Currently     Social History   Substance and Sexual Activity  Drug Use Not Currently  . Types: Marijuana   Comment: xanax- several years ago    Social History   Socioeconomic History  . Marital status: Single    Spouse name: Not on file  . Number of children: Not on file  . Years of education: Not on file  . Highest education level: Not on file  Occupational History  . Not on file  Social Needs  . Financial resource strain: Not on file  . Food insecurity:    Worry: Not on file    Inability: Not  on file  . Transportation needs:    Medical: Not on file    Non-medical: Not on file  Tobacco Use  . Smoking status: Never Smoker  . Smokeless tobacco: Never Used  . Tobacco comment: Non-smoker  Substance and Sexual Activity  . Alcohol use: Not Currently  . Drug use: Not Currently    Types: Marijuana    Comment: xanax- several years ago  . Sexual activity: Not Currently  Lifestyle  . Physical activity:    Days per week: Not on file    Minutes per session: Not on file  . Stress: Not on file  Relationships  . Social connections:    Talks on phone: Not on file    Gets together: Not on file    Attends religious service: Not on file    Active member of club or organization: Not on file    Attends meetings of clubs or organizations: Not on file    Relationship status: Not on file  Other Topics Concern  . Not on file  Social History Narrative   ** Merged History Encounter **       Additional Social History:                         Sleep: Good  Appetite:  Good  Current Medications: Current  Facility-Administered Medications  Medication Dose Route Frequency Provider Last Rate Last Dose  . acetaminophen (TYLENOL) tablet 650 mg  650 mg Oral Q6H PRN Antonieta Pert, MD      . alum & mag hydroxide-simeth (MAALOX/MYLANTA) 200-200-20 MG/5ML suspension 30 mL  30 mL Oral Q4H PRN Malvin Johns, MD   30 mL at 01/27/19 1043  . divalproex (DEPAKOTE) DR tablet 1,500 mg  1,500 mg Oral QHS Malvin Johns, MD   1,500 mg at 01/29/19 2103  . divalproex (DEPAKOTE) DR tablet 500 mg  500 mg Oral Q1200 Malvin Johns, MD   500 mg at 01/29/19 1201  . hydrOXYzine (ATARAX/VISTARIL) tablet 25 mg  25 mg Oral TID PRN Antonieta Pert, MD      . ziprasidone (GEODON) injection 20 mg  20 mg Intramuscular Q12H PRN Antonieta Pert, MD       And  . LORazepam (ATIVAN) tablet 1 mg  1 mg Oral PRN Antonieta Pert, MD      . magnesium hydroxide (MILK OF MAGNESIA) suspension 30 mL  30 mL Oral Daily  PRN Antonieta Pert, MD      . Melene Muller ON 02/02/2019] paliperidone (INVEGA SUSTENNA) injection 156 mg  156 mg Intramuscular Once Malvin Johns, MD      . pantoprazole (PROTONIX) EC tablet 40 mg  40 mg Oral BID Malvin Johns, MD   40 mg at 01/30/19 1610  . QUEtiapine (SEROQUEL) tablet 400 mg  400 mg Oral QHS Malvin Johns, MD      . QUEtiapine (SEROQUEL) tablet 50 mg  50 mg Oral BID Malvin Johns, MD      . traZODone (DESYREL) tablet 50 mg  50 mg Oral QHS PRN Antonieta Pert, MD        Lab Results: No results found for this or any previous visit (from the past 48 hour(s)).  Blood Alcohol level:  Lab Results  Component Value Date   ETH <10 01/22/2019   ETH <10 10/30/2018    Metabolic Disorder Labs: Lab Results  Component Value Date   HGBA1C 5.1 01/05/2017   MPG 100 01/05/2017   Lab Results  Component Value Date   PROLACTIN 30.4 (H) 01/06/2017   PROLACTIN 13.1 01/05/2017   Lab Results  Component Value Date   CHOL 172 01/05/2017   TRIG 112 01/05/2017   HDL 41 01/05/2017   CHOLHDL 4.2 01/05/2017   VLDL 22 01/05/2017   LDLCALC 109 (H) 01/05/2017    Physical Findings: AIMS: Facial and Oral Movements Muscles of Facial Expression: None, normal Lips and Perioral Area: None, normal Jaw: None, normal Tongue: None, normal,Extremity Movements Upper (arms, wrists, hands, fingers): None, normal Lower (legs, knees, ankles, toes): None, normal, Trunk Movements Neck, shoulders, hips: None, normal, Overall Severity Severity of abnormal movements (highest score from questions above): None, normal Incapacitation due to abnormal movements: None, normal Patient's awareness of abnormal movements (rate only patient's report): No Awareness, Dental Status Current problems with teeth and/or dentures?: No Does patient usually wear dentures?: No  CIWA:  CIWA-Ar Total: 0 COWS:  COWS Total Score: 2  Musculoskeletal: Strength & Muscle Tone: within normal limits Gait & Station: normal Patient  leans: N/A  Psychiatric Specialty Exam: Physical Exam  ROS  Blood pressure 99/70, pulse (!) 109, temperature 97.9 F (36.6 C), temperature source Oral, resp. rate 18, height 5\' 10"  (1.778 m), weight 97.1 kg.Body mass index is 30.71 kg/m.  General Appearance: Casual  Eye Contact:  Good  Speech:  Clear and  Coherent  Volume:  Decreased  Mood:  Irritable  Affect:  Congruent  Thought Process:  Irrelevant  Orientation:  Full (Time, Place, and Person)  Thought Content:  Tangential  Suicidal Thoughts:  No  Homicidal Thoughts:  No  Memory:  Immediate;   Fair  Judgement:  Poor  Insight:  lacks  Psychomotor Activity:  Decreased  Concentration:  Concentration: Fair  Recall:  [employer] of Knowledge:  Fair  Language:  Fair  Akathisia:  Negative  Handed:  Right  AIMS (if indicated):     Assets:  Social Support  ADL's:  Intact  Cognition:  WNL  Sleep:  Number of Hours: 6.25     Treatment Plan Summary: Daily contact with patient to assess and evaluate symptoms and progress in treatment, Medication management and Plan We will go ahead and order the in [place] second dose of loading for first shot/first month of therapy on Monday continue but escalate quetiapine in the daytime continue cognitive and rehab based therapies and reality based therapy  Milanni Ayub, MD 01/30/2019, 11:20 AM

## 2019-01-30 NOTE — Tx Team (Signed)
Interdisciplinary Treatment and Diagnostic Plan Update  01/30/2019 Time of Session: 0847 Albert [name] MRN: [id number]  Principal Diagnosis: <principal problem not specified>  Secondary Diagnoses: Active Problems:   Schizoaffective disorder, bipolar type (HCC)   Schizoaffective disorder (HCC)   Bipolar disorder (HCC)   Current Medications:  Current Facility-Administered Medications  Medication Dose Route Frequency Provider Last Rate Last Dose  . acetaminophen (TYLENOL) tablet 650 mg  650 mg Oral Q6H PRN Antonieta Pert, MD      . alum & mag hydroxide-simeth (MAALOX/MYLANTA) 200-200-20 MG/5ML suspension 30 mL  30 mL Oral Q4H PRN Malvin Johns, MD   30 mL at 01/27/19 1043  . divalproex (DEPAKOTE) DR tablet 1,500 mg  1,500 mg Oral QHS Malvin Johns, MD   1,500 mg at 01/29/19 2103  . divalproex (DEPAKOTE) DR tablet 500 mg  500 mg Oral Q1200 Malvin Johns, MD   500 mg at 01/30/19 1203  . hydrOXYzine (ATARAX/VISTARIL) tablet 25 mg  25 mg Oral TID PRN Antonieta Pert, MD      . ziprasidone (GEODON) injection 20 mg  20 mg Intramuscular Q12H PRN Antonieta Pert, MD       And  . LORazepam (ATIVAN) tablet 1 mg  1 mg Oral PRN Antonieta Pert, MD      . magnesium hydroxide (MILK OF MAGNESIA) suspension 30 mL  30 mL Oral Daily PRN Antonieta Pert, MD      . Melene Muller ON 02/02/2019] paliperidone (INVEGA SUSTENNA) injection 156 mg  156 mg Intramuscular Once Malvin Johns, MD      . pantoprazole (PROTONIX) EC tablet 40 mg  40 mg Oral BID Malvin Johns, MD   40 mg at 01/30/19 3244  . QUEtiapine (SEROQUEL) tablet 400 mg  400 mg Oral QHS Malvin Johns, MD      . QUEtiapine (SEROQUEL) tablet 50 mg  50 mg Oral BID Malvin Johns, MD      . traZODone (DESYREL) tablet 50 mg  50 mg Oral QHS PRN Antonieta Pert, MD       PTA Medications: Medications Prior to Admission  Medication Sig Dispense Refill Last Dose  . albuterol (PROVENTIL HFA;VENTOLIN HFA) 108 (90 Base) MCG/ACT inhaler Inhale 2 puffs  into the lungs every 4 (four) hours as needed for wheezing or shortness of breath (or coughing). 1 Inhaler 0 Past Week at Unknown time  . divalproex (DEPAKOTE ER) 500 MG 24 hr tablet 1 in am 3 at h s (Patient taking differently: Take 500-1,500 mg by mouth See admin instructions. 1 in am 3 at bedtime) 120 tablet 2 Past Week at Unknown time  . gabapentin (NEURONTIN) 300 MG capsule Take 1 capsule (300 mg total) by mouth 3 (three) times daily at 8am, 3pm and bedtime. 30 capsule 0 Past Week at Unknown time  . omeprazole (PRILOSEC) 40 MG capsule Take 40 mg by mouth daily.  0 Past Week at Unknown time  . pantoprazole (PROTONIX) 40 MG tablet Take 1 tablet (40 mg total) by mouth 2 (two) times daily before a meal. 60 tablet 0 Past Week at Unknown time  . QUEtiapine (SEROQUEL) 200 MG tablet Take 1 tablet (200 mg total) by mouth at bedtime. 30 tablet 2 Past Week at Unknown time  . QUEtiapine (SEROQUEL) 50 MG tablet Take 1 tablet (50 mg total) by mouth 3 (three) times daily as needed (agitation). 30 tablet 2 Past Month at Unknown time  . traZODone (DESYREL) 100 MG tablet Take 2 tablets (200 mg total) by  mouth at bedtime as needed for sleep. 60 tablet 2 Past Month at Unknown time    Patient Stressors: Other: UTA  Patient Strengths: Other: UTA  Treatment Modalities: Medication Management, Group therapy, Case management,  1 to 1 session with clinician, Psychoeducation, Recreational therapy.   Physician Treatment Plan for Primary Diagnosis: <principal problem not specified> Long Term Goal(s): Improvement in symptoms so as ready for discharge Improvement in symptoms so as ready for discharge   Short Term Goals: Compliance with prescribed medications will improve Ability to disclose and discuss suicidal ideas  Medication Management: Evaluate patient's response, side effects, and tolerance of medication regimen.  Therapeutic Interventions: 1 to 1 sessions, Unit Group sessions and Medication  administration.  Evaluation of Outcomes: Progressing  Physician Treatment Plan for Secondary Diagnosis: Active Problems:   Schizoaffective disorder, bipolar type (HCC)   Schizoaffective disorder (HCC)   Bipolar disorder (HCC)  Long Term Goal(s): Improvement in symptoms so as ready for discharge Improvement in symptoms so as ready for discharge   Short Term Goals: Compliance with prescribed medications will improve Ability to disclose and discuss suicidal ideas     Medication Management: Evaluate patient's response, side effects, and tolerance of medication regimen.  Therapeutic Interventions: 1 to 1 sessions, Unit Group sessions and Medication administration.  Evaluation of Outcomes: Progressing   RN Treatment Plan for Primary Diagnosis: <principal problem not specified> Long Term Goal(s): Knowledge of disease and therapeutic regimen to maintain health will improve  Short Term Goals: Ability to identify and develop effective coping behaviors will improve and Compliance with prescribed medications will improve  Medication Management: RN will administer medications as ordered by provider, will assess and evaluate patient's response and provide education to patient for prescribed medication. RN will report any adverse and/or side effects to prescribing provider.  Therapeutic Interventions: 1 on 1 counseling sessions, Psychoeducation, Medication administration, Evaluate responses to treatment, Monitor vital signs and CBGs as ordered, Perform/monitor CIWA, COWS, AIMS and Fall Risk screenings as ordered, Perform wound care treatments as ordered.  Evaluation of Outcomes: Progressing   LCSW Treatment Plan for Primary Diagnosis: <principal problem not specified> Long Term Goal(s): Safe transition to appropriate next level of care at discharge, Engage patient in therapeutic group addressing interpersonal concerns.  Short Term Goals: Engage patient in aftercare planning with referrals and  resources, Increase social support and Increase skills for wellness and recovery  Therapeutic Interventions: Assess for all discharge needs, 1 to 1 time with Social worker, Explore available resources and support systems, Assess for adequacy in community support network, Educate family and significant other(s) on suicide prevention, Complete Psychosocial Assessment, Interpersonal group therapy.  Evaluation of Outcomes: Progressing   Progress in Treatment: Attending groups: Yes Participating in groups: Yes. Taking medication as prescribed: Yes. Toleration medication: Yes. Family/Significant other contact made: No, will contact:  pt declined consent Patient understands diagnosis: No. Discussing patient identified problems/goals with staff: Yes. Medical problems stabilized or resolved: Yes. Denies suicidal/homicidal ideation: Yes. Issues/concerns per patient self-inventory: No. Other: none  New problem(s) identified: No, Describe:  none  New Short Term/Long Term Goal(s):  Patient Goals:  "my goal is on my sheet"  Discharge Plan or Barriers:   Reason for Continuation of Hospitalization: Delusions  Medication stabilization  Estimated Length of Stay: 1-3 days.Attendees: Patient: 01/30/2019   Physician: Dr. Jeannine Kitten, MD 01/30/2019   Nursing: Dossie Arbour, RN 01/30/2019   RN Care Manager: 01/30/2019   Social Worker: Daleen Squibb, LCSW 01/30/2019   Recreational Therapist:  01/30/2019   Other:  01/30/2019   Other:  01/30/2019   Other: 01/30/2019          Scribe for Treatment Team: Lorri Frederick, LCSW 01/30/2019 2:11 PM

## 2019-01-30 NOTE — Progress Notes (Signed)
Adult Psychoeducational Group Note  Date:  01/30/2019 Time:  4:32 PM  Group Topic/Focus:  Recovery Goals:   The focus of this group is to identify appropriate goals for recovery and establish a plan to achieve them.  Participation Level:  None  Participation Quality:  Resistant  Affect:  Flat  Cognitive:  Oriented  Insight: Limited  Engagement in Group:  None  Modes of Intervention:  Discussion and Education  Additional Comments:  Pt was present during group but refused to participate even after several prompts.  Johanny Segers E 01/30/2019, 4:32 PM

## 2019-01-30 NOTE — Plan of Care (Signed)
  Problem: Activity: Goal: Interest or engagement in activities will improve Outcome: Progressing   D: Pt alert and oriented on the unit. Pt engaging with other pts in the dayroom. Pt denies SI/HI, A/VH, and participated during unit groups and activities. Pt is cooperative. A: Education, support and encouragement provided, q15 minute safety checks remain in effect. Medications administered per MD orders. R: No reactions/side effects to medicine noted. Pt denies any concerns at this time, and verbally contracts for safety. Pt ambulating on the unit with no issues. Pt remains safe on and off the unit.

## 2019-01-30 NOTE — [facility] Group Notes (Signed)
Date: 01/30/19, 1330  Type of Therapy and Topic: Chaplain group, "Hope is.." Chaplain engaged group in discussion about hope and what it looks like in each patients life and current situation.  Participation level:moderate  Modes of Intervention: Discussion, Education and Socialization  Summary of Progress/Problems:Pt was quiet initially but then spoke for some time when chaplain asked him for his opinion.  Pt talked about staff that had done groups during a previous admission at [facility] and also talked about AA meetings and previous experiences there.     Lorri Frederick, LCSW   [facility] LCSW Group Therapy Note

## 2019-01-30 NOTE — Progress Notes (Signed)
Recreation Therapy Notes  Date: 4.3.20 Time: 1000 Location: 500 Hall Dayroom  Group Topic:  Goal Setting  Goal Area(s) Addresses:  Patient will be able to identify at least 3 goals.  Patient will be able to identify benefit of investing in goals.  Patient will be able to identify benefit of setting goals.   Behavioral Response:  Engaged  Intervention: Worksheet, pencils, music  Activity: Goal Planning.  Patients were to identify goals they wanted to accomplish in a week, month, year and five years.  Patients were to then identify obstacles that would prevent them from reaching goals, what they need to reach their goals and what they can start doing now to work towards goals.  Education:  Discharge Planning, Pharmacologist, Leisure Education   Education Outcome: Acknowledges Education/In Group Clarification Provided/Needs Additional Education  Clinical Observations:  Pt explained he wanted to go grocery shopping within the next week; enjoy himself by going to [place] in the next month; start going back to AA in the next year and in five years, share his story in an AA meeting.  Pt identified obstacles as "problem people"; needs to add another friend to work through [employer] in order to achieve goals and "start praying" today in order to be successful.  Pt was flat for most of group.  Pt did request 2 songs to be played during group.     [name], LRT/CTRS     Lillia Abed, Austen Oyster A 01/30/2019 12:12 PM

## 2019-01-30 NOTE — Plan of Care (Signed)
?  Problem: Coping: ?Goal: Ability to verbalize frustrations and anger appropriately will improve ?Outcome: Progressing ?Goal: Ability to demonstrate self-control will improve ?Outcome: Progressing ?  ?Problem: Health Behavior/Discharge Planning: ?Goal: Compliance with treatment plan for underlying cause of condition will improve ?Outcome: Progressing ?  ?

## 2019-01-30 NOTE — [facility] Group Notes (Signed)
The focus of this group is to help patients establish daily goals to achieve during treatment and discuss how the patient can incorporate goal setting into their daily lives to aide in recovery.  Pt. Did not attend Goals group.  

## 2019-01-31 NOTE — Plan of Care (Addendum)
D: Patient presents labile, irritable. He is sarcastic and can be demanding at times. He slept well last night, and declined medication to help. Patient denies SI/HI/AVH.  A: Patient checked q15 min, and checks reviewed. Reviewed medication changes with patient and educated on side effects. Educated patient on importance of attending group therapy sessions and educated on several coping skills. Encouarged participation in milieu through recreation therapy and attending meals with peers. Support and encouragement provided. Fluids offered. R: Patient receptive to education on medications, and is medication compliant. Patient contracts for safety on the unit.   Problem: Activity: Goal: Interest or engagement in activities will improve Outcome: Progressing Goal: Sleeping patterns will improve Outcome: Progressing   Problem: Education: Goal: Emotional status will improve Outcome: Not Progressing Goal: Mental status will improve Outcome: Not Progressing

## 2019-01-31 NOTE — Progress Notes (Signed)
[facility] MD Progress Note  01/31/2019 10:15 AM Albert [name]  MRN:  [id number] Subjective:   Patient generally resting without thoughts of harming self less intrusive less irritable overall understand he will receive 1 more shot prior to discharge Principal Problem: Exacerbation of bipolar/schizoaffective type condition Diagnosis: Active Problems:   Schizoaffective disorder, bipolar type (HCC)   Schizoaffective disorder (HCC)   Bipolar disorder (HCC)  Total Time spent with patient: 20 minutes  Past Medical History:  Past Medical History:  Diagnosis Date  . Alcoholism (HCC)   . Drug abuse (HCC)    xanax addiction 5 years ago  . Medical non-compliance   . Paranoid schizophrenia (HCC)   . Schizophrenia (HCC)   . Seizures (HCC)    History reviewed. No pertinent surgical history. Family History:  Family History  Problem Relation Age of Onset  . Thyroid disease Mother   . Depression Maternal Aunt    Social History:  Social History   Substance and Sexual Activity  Alcohol Use Not Currently     Social History   Substance and Sexual Activity  Drug Use Not Currently  . Types: Marijuana   Comment: xanax- several years ago    Social History   Socioeconomic History  . Marital status: Single    Spouse name: Not on file  . Number of children: Not on file  . Years of education: Not on file  . Highest education level: Not on file  Occupational History  . Not on file  Social Needs  . Financial resource strain: Not on file  . Food insecurity:    Worry: Not on file    Inability: Not on file  . Transportation needs:    Medical: Not on file    Non-medical: Not on file  Tobacco Use  . Smoking status: Never Smoker  . Smokeless tobacco: Never Used  . Tobacco comment: Non-smoker  Substance and Sexual Activity  . Alcohol use: Not Currently  . Drug use: Not Currently    Types: Marijuana    Comment: xanax- several years ago  . Sexual activity: Not Currently  Lifestyle  .  Physical activity:    Days per week: Not on file    Minutes per session: Not on file  . Stress: Not on file  Relationships  . Social connections:    Talks on phone: Not on file    Gets together: Not on file    Attends religious service: Not on file    Active member of club or organization: Not on file    Attends meetings of clubs or organizations: Not on file    Relationship status: Not on file  Other Topics Concern  . Not on file  Social History Narrative   ** Merged History Encounter **       Additional Social History:                         Sleep: Good  Appetite:  Good  Current Medications: Current Facility-Administered Medications  Medication Dose Route Frequency Provider Last Rate Last Dose  . acetaminophen (TYLENOL) tablet 650 mg  650 mg Oral Q6H PRN Antonieta Pert, MD   650 mg at 01/31/19 [phone]  . alum & mag hydroxide-simeth (MAALOX/MYLANTA) 200-200-20 MG/5ML suspension 30 mL  30 mL Oral Q4H PRN Malvin Johns, MD   30 mL at 01/27/19 1043  . divalproex (DEPAKOTE) DR tablet 1,500 mg  1,500 mg Oral QHS Malvin Johns, MD   1,500  mg at 01/30/19 2117  . divalproex (DEPAKOTE) DR tablet 500 mg  500 mg Oral Q1200 Malvin Johns, MD   500 mg at 01/30/19 1203  . hydrOXYzine (ATARAX/VISTARIL) tablet 25 mg  25 mg Oral TID PRN Antonieta Pert, MD      . ziprasidone (GEODON) injection 20 mg  20 mg Intramuscular Q12H PRN Antonieta Pert, MD       And  . LORazepam (ATIVAN) tablet 1 mg  1 mg Oral PRN Antonieta Pert, MD      . magnesium hydroxide (MILK OF MAGNESIA) suspension 30 mL  30 mL Oral Daily PRN Antonieta Pert, MD      . Melene Muller ON 02/02/2019] paliperidone (INVEGA SUSTENNA) injection 156 mg  156 mg Intramuscular Once Malvin Johns, MD      . pantoprazole (PROTONIX) EC tablet 40 mg  40 mg Oral BID Malvin Johns, MD   40 mg at 01/31/19 0817  . QUEtiapine (SEROQUEL) tablet 400 mg  400 mg Oral QHS Malvin Johns, MD   400 mg at 01/30/19 2117  . QUEtiapine (SEROQUEL)  tablet 50 mg  50 mg Oral BID Malvin Johns, MD   50 mg at 01/31/19 0817  . traZODone (DESYREL) tablet 50 mg  50 mg Oral QHS PRN Antonieta Pert, MD        Lab Results: No results found for this or any previous visit (from the past 48 hour(s)).  Blood Alcohol level:  Lab Results  Component Value Date   ETH <10 01/22/2019   ETH <10 10/30/2018    Metabolic Disorder Labs: Lab Results  Component Value Date   HGBA1C 5.1 01/05/2017   MPG 100 01/05/2017   Lab Results  Component Value Date   PROLACTIN 30.4 (H) 01/06/2017   PROLACTIN 13.1 01/05/2017   Lab Results  Component Value Date   CHOL 172 01/05/2017   TRIG 112 01/05/2017   HDL 41 01/05/2017   CHOLHDL 4.2 01/05/2017   VLDL 22 01/05/2017   LDLCALC 109 (H) 01/05/2017    Physical Findings: AIMS: Facial and Oral Movements Muscles of Facial Expression: None, normal Lips and Perioral Area: None, normal Jaw: None, normal Tongue: None, normal,Extremity Movements Upper (arms, wrists, hands, fingers): None, normal Lower (legs, knees, ankles, toes): None, normal, Trunk Movements Neck, shoulders, hips: None, normal, Overall Severity Severity of abnormal movements (highest score from questions above): None, normal Incapacitation due to abnormal movements: None, normal Patient's awareness of abnormal movements (rate only patient's report): No Awareness, Dental Status Current problems with teeth and/or dentures?: No Does patient usually wear dentures?: No  CIWA:  CIWA-Ar Total: 0 COWS:  COWS Total Score: 2  Musculoskeletal: Strength & Muscle Tone: within normal limits Gait & Station: normal Patient leans: N/A  Psychiatric Specialty Exam: Physical Exam  ROS  Blood pressure 99/70, pulse (!) 109, temperature 97.9 F (36.6 C), temperature source Oral, resp. rate 18, height 5\' 10"  (1.778 m), weight 97.1 kg.Body mass index is 30.71 kg/m.  General Appearance: Casual and Disheveled  Eye Contact:  Fair  Speech:  Clear and  Coherent  Volume:  Normal  Mood:  Irritable  Affect:  Congruent  Thought Process:  Goal Directed  Orientation:  Full (Time, Place, and Person)  Thought Content:  Tangential  Suicidal Thoughts:  No  Homicidal Thoughts:  No  Memory:  Immediate;   Fair  Judgement:  Fair  Insight:  Fair  Psychomotor Activity:  Normal  Concentration:  Concentration: Fair  Recall:  Fair  Fund of Knowledge:  Fair  Language:  Fair  Akathisia:  Negative  Handed:  Right  AIMS (if indicated):     Assets:  Leisure Time Physical Health  ADL's:  Intact  Cognition:  WNL  Sleep:  Number of Hours: 6.75     Treatment Plan Summary: Daily contact with patient to assess and evaluate symptoms and progress in treatment, Medication management and Plan Continue cognitive therapy continue antipsychotic therapy mood stabilizer therapy probable discharge Monday  Malvin Johns, MD 01/31/2019, 10:15 AM

## 2019-01-31 NOTE — [facility] Group Notes (Signed)
[facility] Group Notes:  (Nursing/MHT/Case Management/Adjunct)  Date:  01/31/2019  Time:  4:00 PM  Type of Therapy:  Nurse Education  Participation Level:  Active  Participation Quality:  Appropriate and Attentive  Affect:  Appropriate  Cognitive:  Alert and Appropriate  Insight:  Appropriate  Engagement in Group:  Engaged and Improving  Modes of Intervention:  Discussion, Education and Exploration  Summary of Progress/Problems: pt's discussed anger and coping mechanisms to utilize in their plan of care.   Albert [name] Ovadia Lopp 01/31/2019, 5:36 PM

## 2019-01-31 NOTE — Progress Notes (Signed)
Writer spoke with patient 1:1 at medication window. Patient had been asleep at shift change and slept for at least 2 hours. He got up and went to the dayroom where he ate snack, watched a movie and then requested his medications. He reported that he needed to catch up on his rest. He then returned back to his room for the night. He was very calm and pleasant. Safety maintained on unit with 15 min checks.

## 2019-01-31 NOTE — Progress Notes (Signed)
Patient has been observed up in the dayroom watching tv. He attended group and participated. He had came up to the nursing station earlier and after he had finished talking to the mht he stood at nursing station staring at [employer]. Writer asked if he needed anything and I would be with him as soon as I finished talking with [place]. He kept standing so writer asked if he would wait in the dayroom and he reported that he thought Clinical research associate was being rude. He was compliant with his medications. Safety maintained on unit with 15 min checks.

## 2019-01-31 NOTE — [facility] Group Notes (Signed)
[facility] Group Notes: (Clinical Social Work)   01/31/2019      Type of Therapy:  Group Therapy   Participation Level:  Did Not Attend - was invited both individually by MHT and by overhead announcement   Ambrose Mantle, LCSW 01/31/2019, 5:28 PM

## 2019-02-01 NOTE — Progress Notes (Signed)
Patient has been up in the dayroom watching tv and interacting with select peers. He attended group and was compliant with medications. Patient has been pleasant with no issues tonight. Safety maintained on unit with 15 min checks.

## 2019-02-01 NOTE — Plan of Care (Signed)
D: Patient presents labile. He continues to be argumentative, stating "I did not see a doctor yesterday." Dr. Jeannine Kitten did meet with him, and I reminded him of that. With redirection, his lability improves. He takes frequent naps throughout the day. He slept well last, and declined medication to help with sleep. His appetite is good, energy normal and concentration good. He rates his depression, hopelessness and anxiety 0/10. He denies withdrawal symptoms or physical complaints. Patient denies SI/HI/AVH.  A: Patient checked q15 min, and checks reviewed. Reviewed medication changes with patient and educated on side effects. Educated patient on importance of attending group therapy sessions and educated on several coping skills. Encouarged participation in milieu through recreation therapy and attending meals with peers. Support and encouragement provided. Fluids offered. R: Patient receptive to education on medications, and is medication compliant. Patient contracts for safety on the unit. Goal: "Talk to doctor"  Problem: Education: Goal: Emotional status will improve Outcome: Not Progressing Goal: Mental status will improve Outcome: Not Progressing   Problem: Activity: Goal: Interest or engagement in activities will improve Outcome: Not Progressing Goal: Sleeping patterns will improve Outcome: Not Progressing

## 2019-02-01 NOTE — Progress Notes (Addendum)
[facility] MD Progress Note  02/01/2019 1:56 PM Albert [name]  MRN:  [id number]  Evaluation: Albert [name] observed resting in bed.  Presents flat, guarded but pleasant.  Currently denying suicidal or homicidal ideations.  Denies auditory or  visual hallucinations.  Patient observed interacting with peers throughout the milieu.  Reports medications as directed and tolerating medications well.  Patient reports he is follow-up by psychiatrist Betti Cruz for outpatient services.  Reports he feels ready for discharge soon.  Chart review patient scheduled for long-acting injectable for mood stabilization on 02/02/2019.  Reports a good appetite.  States he is resting well throughout the night.  Support encouragement and reassurance was provided.   History:per assessment note on admission- Albert McAlpineis an 43 y.o.male, who presents voluntary and unaccompanied to [facility]. During the assessment it was hard time getting information from the pt due to his disorganized thought process.Clinician asked the pt, "what brought you to the hospital?"Pt reported, his face is hurting a lot today and wonder if it had anything to do with mental illness. Pt also reported, "there is a lot of things going on in [place]." Pt discussed due to social distancing he can not engage in his normal routine and with social distancing people can relapse. Pt reported, in October 29, 2018 during an AA meeting [name] (an AA member) asked him if he was taking his medications.     Principal Problem: Exacerbation of bipolar/schizoaffective type condition Diagnosis: Active Problems:   Schizoaffective disorder, bipolar type (HCC)   Schizoaffective disorder (HCC)   Bipolar disorder (HCC)  Total Time spent with patient: 20 minutes  Past Medical History:  Past Medical History:  Diagnosis Date  . Alcoholism (HCC)   . Drug abuse (HCC)    xanax addiction 5 years ago  . Medical non-compliance   . Paranoid schizophrenia (HCC)   . Schizophrenia  (HCC)   . Seizures (HCC)    History reviewed. No pertinent surgical history. Family History:  Family History  Problem Relation Age of Onset  . Thyroid disease Mother   . Depression Maternal Aunt    Social History:  Social History   Substance and Sexual Activity  Alcohol Use Not Currently     Social History   Substance and Sexual Activity  Drug Use Not Currently  . Types: Marijuana   Comment: xanax- several years ago    Social History   Socioeconomic History  . Marital status: Single    Spouse name: Not on file  . Number of children: Not on file  . Years of education: Not on file  . Highest education level: Not on file  Occupational History  . Not on file  Social Needs  . Financial resource strain: Not on file  . Food insecurity:    Worry: Not on file    Inability: Not on file  . Transportation needs:    Medical: Not on file    Non-medical: Not on file  Tobacco Use  . Smoking status: Never Smoker  . Smokeless tobacco: Never Used  . Tobacco comment: Non-smoker  Substance and Sexual Activity  . Alcohol use: Not Currently  . Drug use: Not Currently    Types: Marijuana    Comment: xanax- several years ago  . Sexual activity: Not Currently  Lifestyle  . Physical activity:    Days per week: Not on file    Minutes per session: Not on file  . Stress: Not on file  Relationships  . Social connections:    Talks on phone:  Not on file    Gets together: Not on file    Attends religious service: Not on file    Active member of club or organization: Not on file    Attends meetings of clubs or organizations: Not on file    Relationship status: Not on file  Other Topics Concern  . Not on file  Social History Narrative   ** Merged History Encounter **       Additional Social History:                         Sleep: Good  Appetite:  Good  Current Medications: Current Facility-Administered Medications  Medication Dose Route Frequency Provider Last Rate  Last Dose  . acetaminophen (TYLENOL) tablet 650 mg  650 mg Oral Q6H PRN Antonieta Pert, MD   650 mg at 01/31/19 1648  . alum & mag hydroxide-simeth (MAALOX/MYLANTA) 200-200-20 MG/5ML suspension 30 mL  30 mL Oral Q4H PRN Malvin Johns, MD   30 mL at 01/27/19 1043  . divalproex (DEPAKOTE) DR tablet 1,500 mg  1,500 mg Oral QHS Malvin Johns, MD   1,500 mg at 01/31/19 2123  . divalproex (DEPAKOTE) DR tablet 500 mg  500 mg Oral Q1200 Malvin Johns, MD   500 mg at 02/01/19 1214  . hydrOXYzine (ATARAX/VISTARIL) tablet 25 mg  25 mg Oral TID PRN Antonieta Pert, MD      . ziprasidone (GEODON) injection 20 mg  20 mg Intramuscular Q12H PRN Antonieta Pert, MD       And  . LORazepam (ATIVAN) tablet 1 mg  1 mg Oral PRN Antonieta Pert, MD      . magnesium hydroxide (MILK OF MAGNESIA) suspension 30 mL  30 mL Oral Daily PRN Antonieta Pert, MD      . Melene Muller ON 02/02/2019] paliperidone (INVEGA SUSTENNA) injection 156 mg  156 mg Intramuscular Once Malvin Johns, MD      . pantoprazole (PROTONIX) EC tablet 40 mg  40 mg Oral BID Malvin Johns, MD   40 mg at 02/01/19 9326  . QUEtiapine (SEROQUEL) tablet 400 mg  400 mg Oral QHS Malvin Johns, MD   400 mg at 01/31/19 2123  . QUEtiapine (SEROQUEL) tablet 50 mg  50 mg Oral BID Malvin Johns, MD   50 mg at 02/01/19 7124  . traZODone (DESYREL) tablet 50 mg  50 mg Oral QHS PRN Antonieta Pert, MD        Lab Results: No results found for this or any previous visit (from the past 48 hour(s)).  Blood Alcohol level:  Lab Results  Component Value Date   ETH <10 01/22/2019   ETH <10 10/30/2018    Metabolic Disorder Labs: Lab Results  Component Value Date   HGBA1C 5.1 01/05/2017   MPG 100 01/05/2017   Lab Results  Component Value Date   PROLACTIN 30.4 (H) 01/06/2017   PROLACTIN 13.1 01/05/2017   Lab Results  Component Value Date   CHOL 172 01/05/2017   TRIG 112 01/05/2017   HDL 41 01/05/2017   CHOLHDL 4.2 01/05/2017   VLDL 22 01/05/2017   LDLCALC  109 (H) 01/05/2017    Physical Findings: AIMS: Facial and Oral Movements Muscles of Facial Expression: None, normal Lips and Perioral Area: None, normal Jaw: None, normal Tongue: None, normal,Extremity Movements Upper (arms, wrists, hands, fingers): None, normal Lower (legs, knees, ankles, toes): None, normal, Trunk Movements Neck, shoulders, hips: None, normal, Overall Severity Severity  of abnormal movements (highest score from questions above): None, normal Incapacitation due to abnormal movements: None, normal Patient's awareness of abnormal movements (rate only patient's report): No Awareness, Dental Status Current problems with teeth and/or dentures?: No Does patient usually wear dentures?: No  CIWA:  CIWA-Ar Total: 0 COWS:  COWS Total Score: 1  Musculoskeletal: Strength & Muscle Tone: within normal limits Gait & Station: normal Patient leans: N/A  Psychiatric Specialty Exam: Physical Exam  Constitutional: He appears well-developed.  Psychiatric: He has a normal mood and affect. His behavior is normal.    Review of Systems  Psychiatric/Behavioral: Positive for depression. The patient is nervous/anxious. The patient does not have insomnia.     Blood pressure 119/81, pulse 90, temperature (!) 97.4 F (36.3 C), temperature source Oral, resp. rate 16, height 5\' 10"  (1.778 m), weight 97.1 kg.Body mass index is 30.71 kg/m.  General Appearance: Casual slight blunted affect, but pleasant   Eye Contact:  Fair  Speech:  Clear and Coherent  Volume:  Normal  Mood:  Irritable  Affect:  Congruent  Thought Process:  Goal Directed  Orientation:  Full (Time, Place, and Person)  Thought Content:  Tangential  Suicidal Thoughts:  No  Homicidal Thoughts:  No  Memory:  Immediate;   Fair  Judgement:  Fair  Insight:  Fair and Lacking  Psychomotor Activity:  Normal  Concentration:  Concentration: Fair  Recall:  [employer] of Knowledge:  Fair  Language:  Fair  Akathisia:  Negative   Handed:  Right  AIMS (if indicated):     Assets:  Desire for Improvement Leisure Time Physical Health Social Support  ADL's:  Intact  Cognition:  WNL  Sleep:  Number of Hours: 6.75     Treatment Plan Summary: Daily contact with patient to assess and evaluate symptoms and progress in treatment and Medication management   Continue with current treatment plan on 02/01/2019 as listed below except for noted  Schizoaffective disorder bipolar  Continue Depakote 500 mg p.o. daily and Depakote 1500 mg p.o. nightly Continue Seroquel 50 mg p.o. twice daily and Seroquel 400 mg p.o. nightly -Long acting injection Invega sustenna 156 mg due 02/02/2019  See chart for agitation protocol  CSW to continue working on discharge disposition Patient encouraged to participate throughout the milieu    Oneta Rack, NP 02/01/2019, 1:56 PM   Attest to NP progress note

## 2019-02-01 NOTE — [facility] Group Notes (Addendum)
[facility] LCSW Group Therapy Note  Date/Time:  02/01/2019  11:00AM-12:00PM  Type of Therapy and Topic:  Group Therapy:  Music and Mood  Participation Level:  Active   Description of Group: In this process group, members listened to a variety of genres of music and identified that different types of music evoke different responses.  Patients were encouraged to identify music that was soothing for them and music that was energizing for them.  Patients discussed how this knowledge can help with wellness and recovery in various ways including managing depression and anxiety as well as encouraging healthy sleep habits.    Therapeutic Goals: 1. Patients will explore the impact of different varieties of music on mood 2. Patients will verbalize the thoughts they have when listening to different types of music 3. Patients will identify music that is soothing to them as well as music that is energizing to them 4. Patients will discuss how to use this knowledge to assist in maintaining wellness and recovery 5. Patients will explore the use of music as a coping skill  Summary of Patient Progress:  At the beginning of group, patient expressed that he felt good, and he smiled while he said this.  He participated fully throughout the hour and at the end of group said he felt "more relaxed."  Therapeutic Modalities: Solution Focused Brief Therapy Activity   Ambrose Mantle, LCSW

## 2019-02-02 MED ORDER — PALIPERIDONE PALMITATE ER 234 MG/1.5ML IM SUSY: 234.0000 mg | PREFILLED_SYRINGE | INTRAMUSCULAR | 11 refills | Status: DC

## 2019-02-02 MED ORDER — TRAZODONE HCL 50 MG PO TABS: 50.0000 mg | ORAL_TABLET | Freq: Every evening | ORAL | 1 refills | Status: DC | PRN

## 2019-02-02 MED ORDER — DIVALPROEX SODIUM ER 500 MG PO TB24: ORAL_TABLET | ORAL | 2 refills | Status: DC

## 2019-02-02 MED ORDER — QUETIAPINE FUMARATE 50 MG PO TABS: 50.0000 mg | ORAL_TABLET | Freq: Two times a day (BID) | ORAL | 1 refills | Status: DC

## 2019-02-02 MED ORDER — QUETIAPINE FUMARATE 400 MG PO TABS: 400.0000 mg | ORAL_TABLET | Freq: Every day | ORAL | 2 refills | Status: DC

## 2019-02-02 NOTE — Progress Notes (Signed)
  [facility] Adult Case Management Discharge Plan :  Will you be returning to the same living situation after discharge:  Yes,  own home At discharge, do you have transportation home?: Yes,  own scooter Do you have the ability to pay for your medications: Yes,  medicare/medicaid  Release of information consent forms completed and in the chart;  Patient's signature needed at discharge.  Patient to Follow up at: Follow-up Information    [facility]. Go on 02/10/2019.   Specialty:  Behavioral Health Why:  Please attend your medication appt with Dr Betti Cruz on Tuesday, 02/10/19, at 3:20pm.  Contact information: [address] [place] [address] [phone]        [facility]. Call in 3 day(s).   Why:  Please call Gret[facility] Short to schedule your next appt. Contact information: [address] [place], [place] [address] P: [phone] F: [phone]          Next level of care provider has access to [facility] Link:no  Safety Planning and Suicide Prevention discussed: No.Pt declined consent.   Have you used any form of tobacco in the last 30 days? (Cigarettes, Smokeless Tobacco, Cigars, and/or Pipes): No  Has patient been referred to the Quitline?: N/A patient is not a smoker  Patient has been referred for addiction treatment: N/A  Lorri Frederick, LCSW 02/02/2019, 9:53 AM

## 2019-02-02 NOTE — Discharge Summary (Signed)
Physician Discharge Summary Note  Patient:  Albert [name] is an 43 y.o., male MRN:  [id number] DOB:  03/14/1976 Patient phone:  [phone] (home)  Patient address:   [address] [place] [id number],  Total Time spent with patient: 45 minutes  Date of Admission:  01/23/2019 Date of Discharge: 02/02/19 Reason for Admission:   Pt presented voluntarily Was diserned to be disorganized refuses most aspects of MSE- irritable - argumentative states "get your clipboard! You told me not to go to AA! Angry w/o provocation More to follow w dictaphone  Acc to assessmnet team:  Albert Daleshristopher McAlpineis an 43 y.o.male, who [facility]. During the assessment it was hard time getting information from the pt due to his disorganized thought process.Clinician asked the pt, "what brought you to the hospital?"Pt reported, his face is hurting a lot today and wonder if it had anything to do with mental illness. Pt also reported, "there [place] Pt discussed due to social distancing he can not engage in his normal routine and with social distancing people can relapse. Pt reported, in October 29, 2018 during an AA meeting Albert RuizJohn (an AA member) asked him if he was taking his medications.Pt reported, he said it with hate. Pt went on about how rude it is to make that type of announcement. Clinician would ask questions the pt would respond with a quote from the AA "big book." Pt [facility] and if "we" would tell [name]. Pt continued to repeat topics of AA, [name] and medications during his assessment. Clinician asked the pt if he was homicidal, clinician observed the pt becoming upset and say, "I don't have enough money to buy a gun." Clinician observed pt became defensive when asked any questions. Clinician explained to the pt that the questions are apart of her assessment. Pt denies, SI, HI, AVH, and access to  weapons.   Clinician was unable to assess the following: stressors, self-injurious behaviors, DSS involvement, legal involvement, depressive symptoms, highest grade completed, vegetative symptoms, family history of suicide, family/friend supports, history of abuse.  Pt's UDS is pending. Pt reported, he has been sober from alcohol for 2.5 years. Pt reported, he is prescribed 100 mg Seroquel and 1500 mg Depakote. Pt did not answer when asked who prescribes his medications. Pt reported, not taking his medications as prescribed. Pt reported, [facility]. Pt reported, he went to an AA meeting last night. [facility].   Pt reported, alert with pressured speech. Pt's eye contact was good. Pt's mood, affect was anxious. Pt's thought process was disorganized. Pt's thought process was impaired. Pt's concentration and impulse control fair. Pt's insight was poor. Pt reported, [facility] he could contract for safety. Pt reported, in order for him to sign-in voluntarily the EDP would have to ask him (so he has a choice).   Principal Problem: <principal problem not specified> Discharge Diagnoses: Active Problems:   Schizoaffective disorder, bipolar type (HCC)   Schizoaffective disorder (HCC)   Bipolar disorder (HCC)   Past Psychiatric History: Extensive and again the problem is his tendency to dictate his own medications  Past Medical History:  Past Medical History:  Diagnosis Date  . Alcoholism (HCC)   . Drug abuse (HCC)    xanax addiction 5 years ago  . Medical non-compliance   .  Paranoid schizophrenia (HCC)   . Schizophrenia (HCC)   . Seizures (HCC)    History reviewed. No pertinent surgical history. Family History:  Family History  Problem Relation Age of Onset  . Thyroid disease Mother   . Depression Maternal Aunt   Social History:  Social History   Substance and Sexual Activity  Alcohol Use  Not Currently     Social History   Substance and Sexual Activity  Drug Use Not Currently  . Types: Marijuana   Comment: xanax- several years ago    Social History   Socioeconomic History  . Marital status: Single    Spouse name: Not on file  . Number of children: Not on file  . Years of education: Not on file  . Highest education level: Not on file  Occupational History  . Not on file  Social Needs  . Financial resource strain: Not on file  . Food insecurity:    Worry: Not on file    Inability: Not on file  . Transportation needs:    Medical: Not on file    Non-medical: Not on file  Tobacco Use  . Smoking status: Never Smoker  . Smokeless tobacco: Never Used  . Tobacco comment: Non-smoker  Substance and Sexual Activity  . Alcohol use: Not Currently  . Drug use: Not Currently    Types: Marijuana    Comment: xanax- several years ago  . Sexual activity: Not Currently  Lifestyle  . Physical activity:    Days per week: Not on file    Minutes per session: Not on file  . Stress: Not on file  Relationships  . Social connections:    Talks on phone: Not on file    Gets together: Not on file    Attends religious service: Not on file    Active member of club or organization: Not on file    Attends meetings of clubs or organizations: Not on file    Relationship status: Not on file  Other Topics Concern  . Not on file  Social History Narrative   ** Merged History Encounter **        Hospital Course:    As discussed Albert [name] is well-known to the service this makes a second admission this year.  At first he was very irritable and argumentative and again this was the whole issue was that he had previously been given the benefit of the doubt and allowed to dictate his treatment at his insistence he would only take low-dose Seroquel and Depakote but it was clearly not holding him as far as his schizoaffective condition.  Irritable argumentative and difficult and challenging  where his typical presentations on morning rounds. Dr. Betti Cruz was kind enough to get on the phone with me and the patient and encouraged him towards long-acting injectable, Dr. Betti Cruz has encouraged it first in [facility] and then later [facility] release some long-acting injectable for several years with this patient who is always refused.  With both clinicians recommending it initially the patient was argumentative but later agreed.  He received paliperidone long-acting injectable and the 2 doses necessary to 34 followed by 156 mg and he is discharged on 4/6. The present time he is alert oriented to person place time situation little guarded but not is irritable denies wanting to harm self or others and denies positive symptoms of his illness.  Physical Findings: AIMS: Facial and Oral Movements Muscles of Facial Expression: None, normal Lips and Perioral Area: None, normal Jaw: None,  normal Tongue: None, normal,Extremity Movements Upper (arms, wrists, hands, fingers): None, normal Lower (legs, knees, ankles, toes): None, normal, Trunk Movements Neck, shoulders, hips: None, normal, Overall Severity Severity of abnormal movements (highest score from questions above): None, normal Incapacitation due to abnormal movements: None, normal Patient's awareness of abnormal movements (rate only patient's report): No Awareness, Dental Status Current problems with teeth and/or dentures?: No Does patient usually wear dentures?: No  CIWA:  CIWA-Ar Total: 0 COWS:  COWS Total Score: 1  Musculoskeletal: Strength & Muscle Tone: within normal limits Gait & Station: normal Patient leans: N/A  Psychiatric Specialty Exam: Physical Exam  ROS  Blood pressure 111/88, pulse (!) 113, temperature 98.1 F (36.7 C), temperature source Oral, resp. rate 16, height 5\' 10"  (1.778 m), weight 97.1 kg.Body mass index is 30.71 kg/m.  General Appearance: Casual  Eye Contact:  Poor  Speech:  Clear and Coherent  Volume:  Decreased   Mood:  Irritable  Affect:  Full Range  Thought Process:  Coherent  Orientation:  Full (Time, Place, and Person)  Thought Content:  Tangential  Suicidal Thoughts:  No  Homicidal Thoughts:  No  Memory:  Immediate;   Fair  Judgement:  Intact  Insight:  Fair  Psychomotor Activity:  Normal  Concentration:  Concentration: Fair  Recall:  Fair  Fund of Knowledge:  Fair  Language:  Fair  Akathisia:  Negative  Handed:  Right  AIMS (if indicated):     Assets:  Communication Skills Desire for Improvement  ADL's:  Intact  Cognition:  WNL  Sleep:  Number of Hours: 5.5     Have you used any form of tobacco in the last 30 days? (Cigarettes, Smokeless Tobacco, Cigars, and/or Pipes): No  Has this patient used any form of tobacco in the last 30 days? (Cigarettes, Smokeless Tobacco, Cigars, and/or Pipes) Yes, No  Blood Alcohol level:  Lab Results  Component Value Date   ETH <10 01/22/2019   ETH <10 10/30/2018    Metabolic Disorder Labs:  Lab Results  Component Value Date   HGBA1C 5.1 01/05/2017   MPG 100 01/05/2017   Lab Results  Component Value Date   PROLACTIN 30.4 (H) 01/06/2017   PROLACTIN 13.1 01/05/2017   Lab Results  Component Value Date   CHOL 172 01/05/2017   TRIG 112 01/05/2017   HDL 41 01/05/2017   CHOLHDL 4.2 01/05/2017   VLDL 22 01/05/2017   LDLCALC 109 (H) 01/05/2017    See Psychiatric Specialty Exam and Suicide Risk Assessment completed by Attending Physician prior to discharge.  Discharge destination:  Home  Is patient on multiple antipsychotic therapies at discharge:  No   Has Patient had three or more failed trials of antipsychotic monotherapy by history:  No  Recommended Plan for Multiple Antipsychotic Therapies: NA   Allergies as of 02/02/2019   No Known Allergies     Medication List    STOP taking these medications   omeprazole 40 MG capsule Commonly known as:  PRILOSEC     TAKE these medications     Indication  albuterol 108 (90 Base)  MCG/ACT inhaler Commonly known as:  PROVENTIL HFA;VENTOLIN HFA Inhale 2 puffs into the lungs every 4 (four) hours as needed for wheezing or shortness of breath (or coughing).  Indication:  Asthma   divalproex 500 MG 24 hr tablet Commonly known as:  DEPAKOTE ER 1 in am 3 at h s What changed:    how much to take  how to take this  when to take this  additional instructions  Indication:  MIXED BIPOLAR AFFECTIVE DISORDER   gabapentin 300 MG capsule Commonly known as:  NEURONTIN Take 1 capsule (300 mg total) by mouth 3 (three) times daily at 8am, 3pm and bedtime.  Indication:  Agitation, Neuropathic Pain   paliperidone 234 MG/1.5ML Susy injection Commonly known as:  Hinda [place]atter Sustenna Inject 234 mg into the muscle every 30 (thirty) days.  Indication:  Schizoaffective Disorder   pantoprazole 40 MG tablet Commonly known as:  PROTONIX Take 1 tablet (40 mg total) by mouth 2 (two) times daily before a meal.  Indication:  Excess Stomach Secretions   QUEtiapine 400 MG tablet Commonly known as:  SEROQUEL Take 1 tablet (400 mg total) by mouth at bedtime. What changed:    medication strength  how much to take  Indication:  Manic Phase of Manic-Depression   QUEtiapine 50 MG tablet Commonly known as:  SEROQUEL Take 1 tablet (50 mg total) by mouth 2 (two) times daily. What changed:    when to take this  reasons to take this  Indication:  Manic Phase of Manic-Depression   traZODone 50 MG tablet Commonly known as:  DESYREL Take 1 tablet (50 mg total) by mouth at bedtime as needed for sleep. What changed:    medication strength  how much to take  Indication:  Anxiety Disorder, Trouble Sleeping      Follow-up Information    Center, [facility]. Go on 02/10/2019.   Specialty:  Behavioral Health Why:  Please attend your medication appt with Dr Betti Cruz on Tuesday, 02/10/19, at 3:20pm.  Contact information: [address] [place]  [phone]          SignedMalvin Johns, MD 02/02/2019, 8:06 AM

## 2019-02-02 NOTE — Progress Notes (Signed)
Recreation Therapy Notes  INPATIENT RECREATION TR PLAN  Patient Details Name: Albert [name] MRN: [id number] DOB: 1976/01/21 Today's Date: 02/02/2019  Rec Therapy Plan Is patient appropriate for Therapeutic Recreation?: Yes Treatment times per week: about 3 days Estimated Length of Stay: 5-7 days TR Treatment/Interventions: Group participation (Comment)  Discharge Criteria Pt will be discharged from therapy if:: Discharged Treatment plan/goals/alternatives discussed and agreed upon by:: Patient/family  Discharge Summary Short term goals set: See patient care plan Progress toward goals comments: Groups attended Which groups?: Wellness, Goal setting, Self-esteem, Coping skills, Other (Comment)(Team building) Reason goals not met: None Therapeutic equipment acquired: N/A Reason patient discharged from therapy: Discharge from hospital Pt/family agrees with progress & goals achieved: Yes Date patient discharged from therapy: 02/02/19    Victorino Sparrow, LRT/CTRS  Ria Comment, Nicky Kras A 02/02/2019, 10:57 AM

## 2019-02-02 NOTE — Progress Notes (Signed)
Recreation Therapy Notes  Date: 4.6.20 Time: 1000 Location: [address] Dayroom  Group Topic: Wellness  Goal Area(s) Addresses:  Patient will define components of whole wellness. Patient will verbalize benefit of whole wellness.  Behavioral Response: Engaged  Intervention:  Music  Activity: Exercise.    Education: Wellness, Building control surveyor.   Education Outcome: Acknowledges education/In group clarification offered/Needs additional education.   Clinical Observations/Feedback:  Pt was bright and engaged during group session.  Pt led group in various exercises.  Pt was on task and focused.      [name], LRT/CTRS     [name] A 02/02/2019 10:44 AM

## 2019-02-02 NOTE — Progress Notes (Signed)
Adult Psychoeducational Group Note  Date:  02/02/2019 Time:  9:03 AM  Group Topic/Focus:  Orientation:   The focus of this group is to educate the patient on the purpose and policies of crisis stabilization and provide a format to answer questions about their admission.  The group details unit policies and expectations of patients while admitted.  Participation Level:  Active  Participation Quality:  Appropriate  Affect:  Appropriate  Cognitive:  Appropriate  Insight: Good  Engagement in Group:  Engaged  Modes of Intervention:  Discussion  Additional Comments:  Pt interacted well with staff and peers during group this morning.  Pt answered all questions and he engaged quit well.  Pt expressed that he was ready to go home.  Wyn Forster Trimaine Maser 02/02/2019, 9:03 AM

## 2019-02-02 NOTE — Progress Notes (Signed)
Pt discharged to lobby. Pt was stable and appreciative at that time. All papers and prescriptions were given and valuables returned. Verbal understanding expressed. Denies SI/HI and A/VH. Pt given opportunity to express concerns and ask questions.  

## 2019-02-02 NOTE — Progress Notes (Signed)
CSW spoke with pt about discharge.  Pt initially when admitted said he did not want CSW to schedule follow up therapy appt with [facility] because he did not want to do video or phone sessions.  Pt now saying he would be willing to meet with Nadine Counts by phone or video.  CSW called [facility], left message, and spoke with pt about contacting him after he gets home as Nadine Counts may not call back prior to his discharge today. Garner Nash, MSW, LCSW Clinical Social Worker 02/02/2019 9:39 AM

## 2019-02-02 NOTE — Progress Notes (Signed)
Dar Note: Patient complained about his cell phone missing.  Patient found his cell phone (black color) after searching through his personal belongings.  Patient signed items returned upon discharge.

## 2019-02-02 NOTE — [facility] Suicide Risk Assessment (Signed)
[facility] Discharge Suicide Risk Assessment   Principal Problem: Chronic undertreated schizoaffective type condition Discharge Diagnoses: Active Problems:   Schizoaffective disorder, bipolar type (HCC)   Schizoaffective disorder (HCC)   Bipolar disorder (HCC)   Total Time spent with patient: 45 minutes  Patient is non-argumentative he is alert and oriented and denies suicidal thoughts or acute psychosis behavior contained and much less irritable on exam Mental Status Per Nursing Assessment::   On Admission:  NA  Demographic Factors:  Male, Caucasian and Living alone  Loss Factors: Decrease in vocational status  Historical Factors: NA  Risk Reduction Factors:   Sense of responsibility to family and Religious beliefs about death  Continued Clinical Symptoms:  Chronic schizoaffective and chronic tendency to dictate his own treatment  Cognitive Features That Contribute To Risk:  Closed-mindedness    Suicide Risk:  Minimal: No identifiable suicidal ideation.  Patients presenting with no risk factors but with morbid ruminations; may be classified as minimal risk based on the severity of the depressive symptoms  Follow-up Information    [facility]. Go on 02/10/2019.   Specialty:  Behavioral Health Why:  Please attend your medication appt with Dr Betti Cruz on Tuesday, 02/10/19, at 3:20pm.  Contact information: [address] [place] [address] [phone]           Plan Of Care/Follow-up recommendations:  Diet:  nl  Malvin Johns, MD 02/02/2019, 8:01 AM

## 2019-02-02 NOTE — Plan of Care (Signed)
Pt attended and engaged in an appropriate mood at completion of recreation therapy group sessions.   [name], LRT/CTRS

## 2019-02-02 NOTE — Progress Notes (Signed)
Adult Psychoeducational Group Note  Date:  02/02/2019 Time:  5:04 AM  Group Topic/Focus:  Wrap-Up Group:   The focus of this group is to help patients review their daily goal of treatment and discuss progress on daily workbooks.  Participation Level:  Active  Participation Quality:  Appropriate  Affect:  Appropriate  Cognitive:  Appropriate  Insight: Appropriate  Engagement in Group:  Engaged  Modes of Intervention:  Discussion  Additional Comments:  Pt said his day was a 8. The one positive thing that happen to his today he was able to talk to the doctor.  Charna Busman Long 02/02/2019, 5:04 AM

## 2019-04-29 ENCOUNTER — Other Ambulatory Visit: Payer: Self-pay

## 2019-04-29 ENCOUNTER — Emergency Department (HOSPITAL_COMMUNITY)
Admission: EM | Admit: 2019-04-29 | Discharge: 2019-04-30 | Disposition: A | Discharge status: Other Acute Inpatient Hospital | Payer: Medicare Other | Attending: Emergency Medicine | Admitting: Emergency Medicine

## 2019-04-29 ENCOUNTER — Encounter (HOSPITAL_COMMUNITY): Payer: Self-pay | Admitting: *Deleted

## 2019-04-29 DIAGNOSIS — Z79899 Other long term (current) drug therapy: Secondary | ICD-10-CM | POA: Diagnosis not present

## 2019-04-29 DIAGNOSIS — R451 Restlessness and agitation: Secondary | ICD-10-CM | POA: Insufficient documentation

## 2019-04-29 DIAGNOSIS — F200 Paranoid schizophrenia: Secondary | ICD-10-CM | POA: Insufficient documentation

## 2019-04-29 DIAGNOSIS — Z20828 Contact with and (suspected) exposure to other viral communicable diseases: Secondary | ICD-10-CM | POA: Diagnosis not present

## 2019-04-29 DIAGNOSIS — R4689 Other symptoms and signs involving appearance and behavior: Secondary | ICD-10-CM | POA: Insufficient documentation

## 2019-04-29 DIAGNOSIS — Z008 Encounter for other general examination: Secondary | ICD-10-CM

## 2019-04-29 LAB — URINALYSIS, ROUTINE W REFLEX MICROSCOPIC
Bacteria, UA: NONE SEEN
Bilirubin Urine: NEGATIVE
Glucose, UA: NEGATIVE mg/dL
Ketones, ur: 5 mg/dL — AB
Leukocytes,Ua: NEGATIVE
Nitrite: NEGATIVE
Protein, ur: 30 mg/dL — AB
Specific Gravity, Urine: 1.03 (ref 1.005–1.030)
pH: 5 (ref 5.0–8.0)

## 2019-04-29 LAB — CBC WITH DIFFERENTIAL/PLATELET
Abs Immature Granulocytes: 0.03 10*3/uL (ref 0.00–0.07)
Basophils Absolute: 0.1 10*3/uL (ref 0.0–0.1)
Basophils Relative: 1 %
Eosinophils Absolute: 1 10*3/uL — ABNORMAL HIGH (ref 0.0–0.5)
Eosinophils Relative: 13 %
HCT: 45.3 % (ref 39.0–52.0)
Hemoglobin: 15.8 g/dL (ref 13.0–17.0)
Immature Granulocytes: 0 %
Lymphocytes Relative: 25 %
Lymphs Abs: 2 10*3/uL (ref 0.7–4.0)
MCH: 32.7 pg (ref 26.0–34.0)
MCHC: 34.9 g/dL (ref 30.0–36.0)
MCV: 93.8 fL (ref 80.0–100.0)
Monocytes Absolute: 0.5 10*3/uL (ref 0.1–1.0)
Monocytes Relative: 7 %
Neutro Abs: 4.4 10*3/uL (ref 1.7–7.7)
Neutrophils Relative %: 54 %
Platelets: 309 10*3/uL (ref 150–400)
RBC: 4.83 MIL/uL (ref 4.22–5.81)
RDW: 13.5 % (ref 11.5–15.5)
WBC: 8 10*3/uL (ref 4.0–10.5)
nRBC: 0 % (ref 0.0–0.2)

## 2019-04-29 LAB — COMPREHENSIVE METABOLIC PANEL
ALT: 23 U/L (ref 0–44)
AST: 23 U/L (ref 15–41)
Albumin: 4.5 g/dL (ref 3.5–5.0)
Alkaline Phosphatase: 54 U/L (ref 38–126)
Anion gap: 8 (ref 5–15)
BUN: 10 mg/dL (ref 6–20)
CO2: 20 mmol/L — ABNORMAL LOW (ref 22–32)
Calcium: 9.2 mg/dL (ref 8.9–10.3)
Chloride: 111 mmol/L (ref 98–111)
Creatinine, Ser: 0.83 mg/dL (ref 0.61–1.24)
GFR calc Af Amer: >60 mL/min (ref >60)
GFR calc non Af Amer: >60 mL/min (ref >60)
Glucose, Bld: 86 mg/dL (ref 70–99)
Potassium: 3.8 mmol/L (ref 3.5–5.1)
Sodium: 139 mmol/L (ref 135–145)
Total Bilirubin: 0.8 mg/dL (ref 0.3–1.2)
Total Protein: 7.6 g/dL (ref 6.5–8.1)

## 2019-04-29 LAB — RAPID URINE DRUG SCREEN, HOSP PERFORMED
Amphetamines: NOT DETECTED
Barbiturates: NOT DETECTED
Benzodiazepines: NOT DETECTED
Cocaine: NOT DETECTED
Opiates: NOT DETECTED
Tetrahydrocannabinol: NOT DETECTED

## 2019-04-29 LAB — ACETAMINOPHEN LEVEL: Acetaminophen (Tylenol), Serum: <10 ug/mL — ABNORMAL LOW (ref 10–30)

## 2019-04-29 LAB — ETHANOL: Alcohol, Ethyl (B): <10 mg/dL (ref <10)

## 2019-04-29 LAB — SALICYLATE LEVEL: Salicylate Lvl: <7 mg/dL (ref 2.8–30.0)

## 2019-04-29 MED ORDER — QUETIAPINE FUMARATE 50 MG PO TABS: 50.0000 mg | ORAL_TABLET | Freq: Two times a day (BID) | ORAL | Status: DC

## 2019-04-29 MED ORDER — TRAZODONE HCL 50 MG PO TABS
50.0000 mg | ORAL_TABLET | Freq: Every evening | ORAL | Status: DC | PRN
  Filled 2019-04-29: qty 1

## 2019-04-29 MED ORDER — LORAZEPAM 2 MG/ML IJ SOLN
2.0000 mg | Freq: Once | INTRAMUSCULAR | Status: DC
  Filled 2019-04-29: qty 1

## 2019-04-29 MED ORDER — PANTOPRAZOLE SODIUM 40 MG PO TBEC
40.0000 mg | DELAYED_RELEASE_TABLET | Freq: Two times a day (BID) | ORAL | Status: DC
  Administered 2019-04-30: 40 mg via ORAL
  Filled 2019-04-29: qty 1

## 2019-04-29 MED ORDER — DIVALPROEX SODIUM ER 500 MG PO TB24
500.0000 mg | ORAL_TABLET | Freq: Every day | ORAL | Status: DC
  Administered 2019-04-29 – 2019-04-30 (×2): 500 mg via ORAL
  Filled 2019-04-29 (×2): qty 1

## 2019-04-29 MED ORDER — GABAPENTIN 300 MG PO CAPS
300.0000 mg | ORAL_CAPSULE | ORAL | Status: DC
  Filled 2019-04-29 (×2): qty 1

## 2019-04-29 MED ORDER — QUETIAPINE FUMARATE 200 MG PO TABS: 400.0000 mg | ORAL_TABLET | Freq: Every day | ORAL | Status: DC

## 2019-04-29 MED ORDER — QUETIAPINE FUMARATE 200 MG PO TABS
200.0000 mg | ORAL_TABLET | Freq: Every day | ORAL | Status: DC
  Administered 2019-04-29: 200 mg via ORAL
  Filled 2019-04-29: qty 1

## 2019-04-29 NOTE — ED Notes (Signed)
TTS completed.  PT calm, cooperative.

## 2019-04-29 NOTE — ED Notes (Signed)
RN, EMT, security, and [employer] at bedside again attempting to swab pt for COVID. Pt continually asking "then what's going to happen?", "why are people dying from it?", "what happens if I have it?" RN answering questions to the best of my ability. Pt able to be calmed and swabbed with one security officer hold arms.

## 2019-04-29 NOTE — Progress Notes (Signed)
Pt meets inpatient criteria per Earleen Newport, NP. Referral information has been sent to the following hospitals for review:  [facility]       Disposition will continue to follow for inpatient placement needs.   Audree Camel, LCSW, [place] Disposition [facility]/TTS [phone]

## 2019-04-29 NOTE — Progress Notes (Signed)
CSW received phone call from [facility] admissions who are requesting pt be tested for Covid-19. Melanie @ [facility] ED has been notified.    Audree Camel, LCSW, [place] Disposition [facility]/TTS [phone]

## 2019-04-29 NOTE — ED Provider Notes (Signed)
Pt received in sign out prior to medical clearance. Labs are normal. Consult to TTS placed   Recardo Evangelist, PA-C 04/29/19 Glean Salvo, MD 05/01/19 [phone]

## 2019-04-29 NOTE — ED Notes (Signed)
Pt asking about Seroquel dose. RN told pt 450mg  was ordered. Pt repeatedly stating I need to tell the doctor that he changed his dose to 200mg . Pt states "A kid needs to cry sometimes. Do they know that? Are the doctors ok with that?"

## 2019-04-29 NOTE — BH Assessment (Signed)
Tele Assessment Note   Patient Name: Albert [name] MRN: [id number] Referring Physician: Bernell ListGekas, PA-C and Schlossman, MD Location of Patient: [facility] Location of Provider: Behavioral Health TTS Department  PER ED REPORT: HPI Albert [name] is a 43 y.o. male with PMHx seizures, paranoid schizophrenia, alcoholism, and drug abuse who presents to the ED today for med clearance. Pt initially presenting asking multiple questions in triage regarding covid 19. When asking patient what brought him in he states he wants answers to covid 19 and what exactly it is. He states he has been hearing from friends on facebook that the government is being paid upwards of $35,000 to inject covid 19 into people's veins who are suicidal. Pt also reports that we are swabbing patient's mouths for something called "paraloy" which is different than paranoid. He states it is a much more severe form of paranoid and that we are swabbing everyone for it. Pt states that he had a facebook friend who was tested positive for covid 19 and her boyfriend whom she had been kissing was tested and was negative. Patient states that the friend who had covid 19 was given medications that he sees on TV but he is not sure if she died. Pt is asking if we have had people die in this hospital because he has seen [place]. Patient wants to know what they actually died from because he has been hearing that covid 19 is not actually a virus.   TTS Assessment: Patient has been experiencing paranoid delusions.  He states, "my dilemma is Covid 2219.  Patient has many questions that he is seeking answers for concerning Covid 19 and he is obsessed with these thoughts.  He is talking about the virus and he is tangential and hyper-verbal.  His thoughts are loose and disorganized as he is talking about Covid, he starts questioning why people have used the DSM-IV to label him as a schizophrenic and how he can be diagnosed  with schizophrenia by people who have no idea what schizophrenia is and he is concerned that medical staff in [facility] cannot tell him what Covid 19 is and how can Covid be diagnosed if they do not know what it is.  Patient then jumps back to 1999 when he was first diagnosed with schizophrenia.  He states that he attempted suicide by trying to shoot, hang and cut himself.  He admitted that he was also drinking back then, but then states that he is three years sober now.  He states that he attends [facility] and he states that he has a Manufacturing engineerspiritual advisor in the program.  However, his former discharge note by Dr, Jeannine KittenFarah states that he has been kicked out of [facility] due to being disruptive.  Patient states that he has not been sleeping very well lately. He states, "my sleep has been off track," He states that he normally sleeps five to six hours, but he presents as being manic and hit has been difficult to follow what he is reporting.    Patient states that he was hospitalized at [facility] in [facility] and he [facility].  It appears that he has been hospitalized at [facility] three times in the past year.  He states that he see [name] currently for therapy and he states that Dr. Betti Cruzeddy does his medication management.  Patient is alert and oriented, but having obsessive thoughts,  He disorganized and his speech is tangential and he is hyper-verbal. He appears to be paranoid, but he does not appear to be responding to internal stimuli.  His judgment, insight and impulse control are impaired.  His psycho-motor activity is restless.  His memory appears to be intact.  Diagnosis: Schizo-affective Disorder, Bipolar Disorder F 25.0  Past Medical History:  Past Medical History:  Diagnosis Date  . Alcoholism (HCC)   . Drug abuse (HCC)    xanax addiction 5 years ago  . Medical non-compliance   . Paranoid schizophrenia (HCC)   . Schizophrenia  (HCC)   . Seizures (HCC)     History reviewed. No pertinent surgical history.  Family History:  Family History  Problem Relation Age of Onset  . Thyroid disease Mother   . Depression Maternal Aunt     Social History:  reports that he has never smoked. He has never used smokeless tobacco. He reports previous alcohol use. He reports previous drug use. Drug: Marijuana.  Additional Social History:  Alcohol / Drug Use Pain Medications: see MAR Prescriptions: see MAR Over the Counter: see MAR History of alcohol / drug use?: (History of alcohol use.  Patient states that he has been sober x 3 yrs) Longest period of sobriety (when/how long): sober x 3 years  CIWA: CIWA-Ar BP: (!) 160/71 Pulse Rate: (!) 107 COWS:    Allergies: No Known Allergies  Home Medications: (Not in a hospital admission)   OB/GYN Status:  No LMP for male patient.  General Assessment Data [facility] of Assessment[facility] TTS Assessment: In system Is this a Tele or Face-to-Face Assessment?: Tele Assessment Is this an Initial Assessment or a Re-assessment for this encounter?: Initial Assessment Patient Accompanied by:: N/A Language Other than English: No Living Arrangements: Other (Comment)(lives alone in apartment) What gender do you identify as?: Male Marital status: Single Living Arrangements: Alone Can pt return to current living arrangement?: Yes Admission Status: Voluntary Is patient capable of signing voluntary admission?: No Referral Source: Self/Family/Friend Insurance type: Medicare and Medicaid     Crisis Care Plan Living Arrangements: Alone Legal Guardian: Other:(self) Name of Psychiatrist: Dr Betti Cruzeddy Name of Therapist: Gretchen Short([name])  Education Status Is patient currently in school?: No  Risk to self with the past 6 months Suicidal Ideation: No Has patient been a risk to self within the past 6 months prior to admission? : No Suicidal Intent: No Has patient had any suicidal intent within  the past 6 months prior to admission? : No Is patient at risk for suicide?: No Suicidal Plan?: No-Not Currently/Within Last 6 Months Has patient had any suicidal plan within the past 6 months prior to admission? : No Access to Means: No What has been your use of drugs/alcohol within the last 12 months?: none Previous Attempts/Gestures: Yes How many times?: (2) Other Self Harm Risks: (hx of mental health issues) Triggers for Past Attempts: Other (Comment)(psychosis and alcohol use) Intentional Self Injurious Behavior: Cutting Comment - Self Injurious Behavior: states that he has not cut in 1999 Family Suicide History: No Recent stressful life event(s): Trauma (Comment) Persecutory voices/beliefs?: (anxiety over covid 19) Depression: No Substance abuse history and/or treatment for substance abuse?: Yes Suicide prevention information given to non-admitted patients: Not applicable  Risk to Others within the past 6 months Homicidal Ideation: No Does patient have any lifetime risk of violence toward others beyond the six months prior to admission? : No Thoughts of Harm to Others: No Current Homicidal Intent: No Current Homicidal  Plan: No Access to Homicidal Means: No Identified Victim: none History of harm to others?: No Assessment of Violence: None Noted Violent Behavior Description: none Does patient have access to weapons?: No Criminal Charges Pending?: No Does patient have a court date: No Is patient on probation?: No  Psychosis Hallucinations: None noted Delusions: (paranoid delusions)  Mental Status Report Appearance/Hygiene: Unremarkable Eye Contact: Good Motor Activity: Restlessness Speech: Logical/coherent Level of Consciousness: Restless Mood: Anxious Affect: Anxious Anxiety Level: Moderate Thought Processes: Tangential, Flight of Ideas Judgement: Partial Orientation: Person, Place, Time, Situation Obsessive Compulsive Thoughts/Behaviors: Severe  Cognitive  Functioning Concentration: Normal Memory: Recent Intact, Remote Intact Is patient IDD: No Insight: Fair Impulse Control: Poor Appetite: Good Have you had any weight changes? : No Change Sleep: Decreased Total Hours of Sleep: (5-6) Vegetative Symptoms: None  ADLScreening [facility] Assessment Services) Patient's cognitive ability adequate to safely complete daily activities?: Yes Patient able to express need for assistance with ADLs?: Yes Independently performs ADLs?: Yes (appropriate for developmental age)  Prior Inpatient Therapy Prior Inpatient Therapy: Yes Prior Therapy Dates: 1999, 2018, 2020 Prior Therapy Facilty/Provider(s[facility] Reason for Treatment: (Psychosis)  Prior Outpatient Therapy Prior Outpatient Therapy: Yes Prior Therapy Dates: active Prior Therapy Facilty/Provider(s): Dr. Reece Levy Reason for Treatment: psychosis Does patient have an ACCT team?: No Does patient have Intensive In-House Services?  : No Does patient have [facility] services? : No Does patient have P4CC services?: No  ADL Screening (condition at time of admission) Patient's cognitive ability adequate to safely complete daily activities?: Yes Is the patient deaf or have difficulty hearing?: No Does the patient have difficulty seeing, even when wearing glasses/contacts?: No Does the patient have difficulty concentrating, remembering, or making decisions?: No Patient able to express need for assistance with ADLs?: Yes Does the patient have difficulty dressing or bathing?: No Independently performs ADLs?: Yes (appropriate for developmental age) Does the patient have difficulty walking or climbing stairs?: No Weakness of Legs: None Weakness of Arms/Hands: None  Home Assistive Devices/Equipment Home Assistive Devices/Equipment: None    Abuse/Neglect Assessment (Assessment to be complete while patient is alone) Abuse/Neglect Assessment Can Be Completed: Yes Physical Abuse:  Denies Verbal Abuse: Denies Sexual Abuse: Denies Exploitation of patient/patient's resources: Denies Self-Neglect: Denies Values / Beliefs Cultural Requests During Hospitalization: None Spiritual Requests During Hospitalization: None Consults Spiritual Care Consult Needed: No Social Work Consult Needed: No Regulatory affairs officer (For Healthcare) Does Patient Have a Medical Advance Directive?: No Would patient like information on creating a medical advance directive?: No - Patient declined Nutrition Screen- [facility] Adult/WL/AP Has the patient recently lost weight without trying?: No Has the patient been eating poorly because of a decreased appetite?: No Malnutrition Screening Tool Score: 0        Disposition: Per Shuvon Rankin, NP, Inpatient Treatment is recommended Disposition Initial Assessment Completed for this Encounter: Yes  This service was provided via telemedicine using a 2-way, interactive audio and video technology.  Names of all persons participating in this telemedicine service and their role in this encounter. Name: Albert [name] Role: patient  Name: Kasandra Knudsen Henritta Mutz Role: TTS  Name:  Role:   Name:  Role:     Reatha Armour 04/29/2019 6:30 PM

## 2019-04-29 NOTE — ED Provider Notes (Signed)
°[facility] EMERGENCY DEPARTMENT °Provider Note ° ° °CSN: [id number] °Arrival date & time: 04/29/19  1147 ° ° ° °History   °Chief Complaint °Chief Complaint  °Patient presents with  °• Medical Clearance  ° ° °HPI °Albert [name] is a 42 y.o. male with PMHx seizures, paranoid schizophrenia, alcoholism, and drug abuse who presents to the ED today for med clearance. Pt initially presenting asking multiple questions in triage regarding covid 19. When asking patient what brought him in he states he wants answers to covid 19 and what exactly it is. He states he has been hearing from friends on facebook that the government is being paid upwards of $35,000 to inject covid 19 into people's veins who are suicidal. Pt also reports that we are swabbing patient's mouths for something called "paraloy" which is different than paranoid. He states it is a much more severe form of paranoid and that we are swabbing everyone for it. Pt states that he had a facebook friend who was tested positive for covid 19 and her boyfriend whom she had been kissing was tested and was negative. Patient states that the friend who had covid 19 was given medications that he sees on TV but he is not sure if she died. Pt is asking if we have had people die in this hospital because he has seen positive cases of [place]id 19 in [place]. Patient wants to know what they actually died from because he has been hearing that covid 19 is not actually a virus.  ° °  ° ° ° °Past Medical History:  °Diagnosis Date  °• Alcoholism (HCC)   °• Drug abuse (HCC)   ° xanax addiction 5 years ago  °• Medical non-compliance   °• Paranoid schizophrenia (HCC)   °• Schizophrenia (HCC)   °• Seizures (HCC)   ° ° °Patient Active Problem List  ° Diagnosis Date Noted  °• Bipolar disorder (HCC) 01/24/2019  °• Schizoaffective disorder (HCC) 01/23/2019  °• Paranoid schizophrenia (HCC) 06/05/2018  °• Alcohol use disorder, moderate, in sustained remission (HCC)  07/09/2017  °• Cannabis use disorder, moderate, in sustained remission (HCC) 07/09/2017  °• Elevated TSH 01/05/2017  °• Tachycardia 01/05/2017  °• Schizoaffective disorder, bipolar type (HCC) 01/04/2017  °• Alcoholism (HCC)   °• Drug abuse (HCC)   °• Medical non-compliance   ° ° °History reviewed. No pertinent surgical history.  ° ° ° ° °Home Medications   ° °Prior to Admission medications   °Medication Sig Start Date End Date Taking? Authorizing Provider  °albuterol (PROVENTIL HFA;VENTOLIN HFA) 108 (90 Base) MCG/ACT inhaler Inhale 2 puffs into the lungs every 4 (four) hours as needed for wheezing or shortness of breath (or coughing). 01/22/19   Glick, David, MD  °divalproex (DEPAKOTE ER) 500 MG 24 hr tablet 1 in am 3 at h s 02/02/19   Farah, Brian, MD  °gabapentin (NEURONTIN) 300 MG capsule Take 1 capsule (300 mg total) by mouth 3 (three) times daily at 8am, 3pm and bedtime. 07/17/17   Lewis, Tanika N, NP  °paliperidone (INVEGA SUSTENNA) 234 MG/1.5ML SUSY injection Inject 234 mg into the muscle every 30 (thirty) days. 02/02/19   Farah, Brian, MD  °pantoprazole (PROTONIX) 40 MG tablet Take 1 tablet (40 mg total) by mouth 2 (two) times daily before a meal. 01/09/17   Agustin, Sheila, NP  °QUEtiapine (SEROQUEL) 400 MG tablet Take 1 tablet (400 mg total) by mouth at bedtime. 02/02/19   Farah, Brian, MD  °QUEtiapine (SEROQUEL) 50 MG   tablet Take 1 tablet (50 mg total) by mouth 2 (two) times daily. 02/02/19   Farah, Brian, MD  °traZODone (DESYREL) 50 MG tablet Take 1 tablet (50 mg total) by mouth at bedtime as needed for sleep. 02/02/19   Farah, Brian, MD  ° ° °Family History °Family History  °Problem Relation Age of Onset  °• Thyroid disease Mother   °• Depression Maternal Aunt   ° ° °Social History °Social History  ° °Tobacco Use  °• Smoking status: Never Smoker  °• Smokeless tobacco: Never Used  °• Tobacco comment: Non-smoker  °Substance Use Topics  °• Alcohol use: Not Currently  °• Drug use: Not Currently  °  Types: Marijuana  °   Comment: xanax- several years ago  ° ° ° °Allergies   °Patient has no known allergies. ° ° °Review of Systems °Review of Systems  °Unable to perform ROS: Psychiatric disorder  ° ° ° °Physical Exam °Updated Vital Signs °BP (!) 160/71 (BP Location: Right Arm)    Pulse (!) 107    Temp 98.1 °F (36.7 °C) (Oral)    Resp 18    SpO2 93%  ° °Physical Exam °Vitals signs and nursing note reviewed.  °Constitutional:   °   Appearance: He is not ill-appearing.  °HENT:  °   Head: Normocephalic and atraumatic.  °Eyes:  °   Conjunctiva/sclera: Conjunctivae normal.  °Cardiovascular:  °   Rate and Rhythm: Normal rate and regular rhythm.  °Pulmonary:  °   Effort: Pulmonary effort is normal.  °   Breath sounds: Normal breath sounds.  °Skin: °   General: Skin is warm and dry.  °   Coloration: Skin is not jaundiced.  °Neurological:  °   Mental Status: He is alert.  °Psychiatric:     °   Speech: Speech is rapid and pressured.     °   Behavior: Behavior is agitated.     °   Thought Content: Thought content is paranoid.  ° ° ° ° °ED Treatments / Results  °Labs °(all labs ordered are listed, but only abnormal results are displayed) °Labs Reviewed  °COMPREHENSIVE METABOLIC PANEL  °ETHANOL  °RAPID URINE DRUG SCREEN, HOSP PERFORMED  °CBC WITH DIFFERENTIAL/PLATELET  °SALICYLATE LEVEL  °ACETAMINOPHEN LEVEL  °URINALYSIS, ROUTINE W REFLEX MICROSCOPIC  ° ° °EKG °None ° °Radiology °No results found. ° °Procedures °Procedures (including critical care time) ° °Medications Ordered in ED °Medications - No data to display ° ° °Initial Impression / Assessment and Plan / ED Course  °I have reviewed the triage vital signs and the nursing notes. ° °Pertinent labs & imaging results that were available during my care of the patient were reviewed by me and considered in my medical decision making (see chart for details). ° °  °42 year old male presenting to the ED with multiple questions regarding covid 19. He has a hx of paranoid schizophrenia and has been  hearing many things about covid 19 and how we as a nation are injecting covid 19 into people's veins. More information provided in HPI. Patient becomes very agitated when his questions are not met with appropriate answers. He has pressured speech at this time. Unable to illicit whether patient has SI, HI, or auditory/visual hallucinations. Unable to illicit much else from patient as he keeps going back to covid 19 as well as a lottery at his church but he is unsure what the lottery is for. Patient wants to know what the lottery is for at   this time and is agitated when I cannot give him an answer to that either. IVC paperwork placed at this time as I believe if patient leaves he will either be a danger to himself or others. Will have TTS evaluate patient. Unsure if he will be willing to give blood at this time.  ° °Personally reviewed past history; pt was seen on 03/26 after someone in his AA group told him he needs to seek a medical professionals help. He was found to have similar pressured speech at that point. He was admitted for inpatient treatment and eventually discharged after he contracted for safety. It appears initially patient was only taking low dose seroquel and depakote which was not helping him in terms of his schizoaffective disorder. He agreed to long acting injectables during admission and started on invega. Discharged 04/06.  ° °3:49 PM °At shift change care signed out to Kelly Gekas, PA-C, who will medically clear patient once labs return. TTS to evaluate afterwards. They can rescind IVC paperwork if they deem appropriate for outpatient management.  °  ° ° ° °Final Clinical Impressions(s) / ED Diagnoses  ° °Final diagnoses:  °Medical clearance for psychiatric admission  ° ° °ED Discharge Orders   ° None  °  ° °  °, , PA-C °04/29/19 1549 ° °  °Pfeiffer, Marcy, MD °04/30/19 0755 ° °

## 2019-04-29 NOTE — ED Triage Notes (Signed)
Pt checking in asking multiple questions about covid and not making any sense. Unable to get a thorough history from patient. Unable to assess SI or HI.

## 2019-04-29 NOTE — ED Notes (Signed)
RN attempted swabbing pt for COVID. Pt stated he would refuse and "you need to tell me to get the fuck out or find another solution because I'm not letting you do that." RN speaking with MD and security about obtaining meds to calm pt to be able to swab.

## 2019-04-30 ENCOUNTER — Other Ambulatory Visit: Payer: Self-pay

## 2019-04-30 DIAGNOSIS — R4689 Other symptoms and signs involving appearance and behavior: Secondary | ICD-10-CM | POA: Diagnosis not present

## 2019-04-30 LAB — SARS CORONAVIRUS 2 BY RT PCR (HOSPITAL ORDER, PERFORMED IN ~~LOC~~ HOSPITAL LAB): SARS Coronavirus 2: NEGATIVE

## 2019-04-30 NOTE — ED Notes (Addendum)
Copy of IVC paperwork faxed to [facility] as requested. Deputy aware of need for transport. Advised will be later this afternoon.

## 2019-04-30 NOTE — ED Notes (Signed)
Pt finishing eating lunch. Deputy has arrived to transport pt.

## 2019-04-30 NOTE — Progress Notes (Addendum)
Pt accepted to [facility]  Darden Palmer, MD is the accepting/attending provider  provider.  Call report to [phone] [facility] notified.   Pt is IVC'd Pt may be transported by [employer] Pt scheduled  to arrive at [facility] as soon s transport can be arranged.  Please fax IVC to [phone]  Areatha Keas. Judi Cong, MSW, [facility] Disposition Clinical Social Work [phone] (cell) [phone] (office)

## 2019-04-30 NOTE — ED Provider Notes (Signed)
Patient evaluated on morning rounds.  He has no complaints and just asks when he will be reevaluated today.  I made him aware that there are only 2 counselors and it may be somewhat delayed.  He is calm and understanding.  No requests or complaints at this time.  Vitals are stable.  CSW reports patient has been accepted to [facility].  Transport per [employer] as soon as transport can be arranged.  Patient is IVC'd.   Frederica Kuster, PA-C 04/30/19 1141    Lacretia Leigh, MD 05/03/19 [phone]

## 2019-04-30 NOTE — ED Notes (Signed)
States does not take Neurontin. Took other meds w/o difficulty.

## 2019-04-30 NOTE — ED Notes (Signed)
bfast ordered 

## 2019-04-30 NOTE — ED Notes (Addendum)
Pt voiced understanding and agreement w/tx plan - Accepted to [facility] belongings - 1 labeled belongings bag - Deputy - Pt aware. Pt on phone notifying "Clair Gulling" (landlord) of tx plan. Advising him that he will pay his rent when he is d/c'd from facility. Pt noted to be very lucid. Pt called his mother - left message for his mother requesting her to get money to his landlord to prevent possible eviction.

## 2019-05-30 ENCOUNTER — Other Ambulatory Visit: Payer: Self-pay

## 2019-05-30 ENCOUNTER — Emergency Department (HOSPITAL_COMMUNITY)
Admission: EM | Admit: 2019-05-30 | Discharge: 2019-05-30 | Disposition: A | Discharge status: Home / Self Care | Payer: Medicare Other | Attending: Emergency Medicine | Admitting: Emergency Medicine

## 2019-05-30 DIAGNOSIS — F319 Bipolar disorder, unspecified: Secondary | ICD-10-CM | POA: Insufficient documentation

## 2019-05-30 DIAGNOSIS — F22 Delusional disorders: Secondary | ICD-10-CM | POA: Diagnosis not present

## 2019-05-30 DIAGNOSIS — Z79899 Other long term (current) drug therapy: Secondary | ICD-10-CM | POA: Diagnosis not present

## 2019-05-30 NOTE — Discharge Instructions (Signed)
Please take your medications as prescribed by your psychiatrist.  Return to the emergency department if you develop increasing or worsening symptoms such as severe hallucinations or homicidal or suicidal thoughts.

## 2019-05-30 NOTE — ED Triage Notes (Signed)
Pt with a flight of ideas; states that he came in bc he had a flat tire
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
## 2019-06-13 ENCOUNTER — Encounter (HOSPITAL_COMMUNITY): Payer: Self-pay | Admitting: *Deleted

## 2019-06-13 ENCOUNTER — Other Ambulatory Visit: Payer: Self-pay

## 2019-06-13 ENCOUNTER — Emergency Department (HOSPITAL_COMMUNITY)
Admission: EM | Admit: 2019-06-13 | Discharge: 2019-06-13 | Disposition: A | Discharge status: Home / Self Care | Payer: Medicare Other | Attending: Emergency Medicine | Admitting: Emergency Medicine

## 2019-06-13 DIAGNOSIS — Z046 Encounter for general psychiatric examination, requested by authority: Secondary | ICD-10-CM | POA: Diagnosis present

## 2019-06-13 DIAGNOSIS — Z5321 Procedure and treatment not carried out due to patient leaving prior to being seen by health care provider: Secondary | ICD-10-CM | POA: Insufficient documentation

## 2019-06-13 NOTE — ED Notes (Signed)
Pt rambling about the phlebotomist said it was ok for him to leave. Stated pt can leave if he wishes but he would be leaving AMA. Pt left.

## 2019-06-13 NOTE — ED Triage Notes (Signed)
Very difficult to understand pt's primary complaint; pt with several flight of ideas. States his only friend moved away and that he became upset seeing [name] with other people. Denies SI/HI/hallucinations. Pt also states he's been decreasing his medications to "feel compassion" and "to be able to cry."

## 2019-07-10 ENCOUNTER — Emergency Department (HOSPITAL_COMMUNITY)
Admission: EM | Admit: 2019-07-10 | Discharge: 2019-07-14 | Disposition: A | Discharge status: Other Acute Inpatient Hospital | Payer: Medicare Other | Attending: Emergency Medicine | Admitting: Emergency Medicine

## 2019-07-10 ENCOUNTER — Encounter (HOSPITAL_COMMUNITY): Payer: Self-pay | Admitting: *Deleted

## 2019-07-10 DIAGNOSIS — F419 Anxiety disorder, unspecified: Secondary | ICD-10-CM | POA: Diagnosis not present

## 2019-07-10 DIAGNOSIS — Z79899 Other long term (current) drug therapy: Secondary | ICD-10-CM | POA: Insufficient documentation

## 2019-07-10 DIAGNOSIS — F1910 Other psychoactive substance abuse, uncomplicated: Secondary | ICD-10-CM | POA: Diagnosis not present

## 2019-07-10 DIAGNOSIS — R4189 Other symptoms and signs involving cognitive functions and awareness: Secondary | ICD-10-CM | POA: Diagnosis not present

## 2019-07-10 DIAGNOSIS — F209 Schizophrenia, unspecified: Secondary | ICD-10-CM | POA: Diagnosis present

## 2019-07-10 DIAGNOSIS — F259 Schizoaffective disorder, unspecified: Secondary | ICD-10-CM | POA: Diagnosis present

## 2019-07-10 DIAGNOSIS — R451 Restlessness and agitation: Secondary | ICD-10-CM | POA: Diagnosis not present

## 2019-07-10 DIAGNOSIS — F250 Schizoaffective disorder, bipolar type: Secondary | ICD-10-CM | POA: Diagnosis present

## 2019-07-10 DIAGNOSIS — Z20828 Contact with and (suspected) exposure to other viral communicable diseases: Secondary | ICD-10-CM | POA: Diagnosis not present

## 2019-07-10 DIAGNOSIS — G4700 Insomnia, unspecified: Secondary | ICD-10-CM | POA: Diagnosis not present

## 2019-07-10 NOTE — Progress Notes (Signed)
TTS calling triage, no answer.   Lind Covert, MSW, LCSW Therapeutic Triage Specialist  [phone]

## 2019-07-10 NOTE — Progress Notes (Signed)
TTS consult ordered for pt in the waiting room. Unable to assess pt until he is in a private room. Please reorder consult when pt is in a private room and able to be assessed.  Lind Covert, MSW, LCSW Therapeutic Triage Specialist  [phone]

## 2019-07-10 NOTE — ED Triage Notes (Signed)
Pt arrives to registration desk asking "when will the universe end". Reluctant to come to triage area. Pt is rambling in triage, very difficult to redirect. Pt says he is taking his medications, able to answer orientation questions appropriately. Says he arrived here via his scooter. He denies SI or HI. Requested Dr. Leonette Monarch to see the patient in triage.

## 2019-07-11 ENCOUNTER — Other Ambulatory Visit: Payer: Self-pay

## 2019-07-11 DIAGNOSIS — F209 Schizophrenia, unspecified: Secondary | ICD-10-CM | POA: Diagnosis not present

## 2019-07-11 LAB — CBC
HCT: 46 % (ref 39.0–52.0)
Hemoglobin: 15.9 g/dL (ref 13.0–17.0)
MCH: 33.3 pg (ref 26.0–34.0)
MCHC: 34.6 g/dL (ref 30.0–36.0)
MCV: 96.2 fL (ref 80.0–100.0)
Platelets: 296 10*3/uL (ref 150–400)
RBC: 4.78 MIL/uL (ref 4.22–5.81)
RDW: 13.2 % (ref 11.5–15.5)
WBC: 8.7 10*3/uL (ref 4.0–10.5)
nRBC: 0 % (ref 0.0–0.2)

## 2019-07-11 LAB — COMPREHENSIVE METABOLIC PANEL
ALT: 22 U/L (ref 0–44)
AST: 24 U/L (ref 15–41)
Albumin: 4.5 g/dL (ref 3.5–5.0)
Alkaline Phosphatase: 51 U/L (ref 38–126)
Anion gap: 8 (ref 5–15)
BUN: 14 mg/dL (ref 6–20)
CO2: 24 mmol/L (ref 22–32)
Calcium: 9.4 mg/dL (ref 8.9–10.3)
Chloride: 105 mmol/L (ref 98–111)
Creatinine, Ser: 0.82 mg/dL (ref 0.61–1.24)
GFR calc Af Amer: >60 mL/min (ref >60)
GFR calc non Af Amer: >60 mL/min (ref >60)
Glucose, Bld: 85 mg/dL (ref 70–99)
Potassium: 4.3 mmol/L (ref 3.5–5.1)
Sodium: 137 mmol/L (ref 135–145)
Total Bilirubin: 0.8 mg/dL (ref 0.3–1.2)
Total Protein: 7.9 g/dL (ref 6.5–8.1)

## 2019-07-11 LAB — RAPID URINE DRUG SCREEN, HOSP PERFORMED
Amphetamines: NOT DETECTED
Barbiturates: NOT DETECTED
Benzodiazepines: NOT DETECTED
Cocaine: NOT DETECTED
Opiates: NOT DETECTED
Tetrahydrocannabinol: NOT DETECTED

## 2019-07-11 LAB — ETHANOL: Alcohol, Ethyl (B): <10 mg/dL (ref <10)

## 2019-07-11 LAB — ACETAMINOPHEN LEVEL: Acetaminophen (Tylenol), Serum: <10 ug/mL — ABNORMAL LOW (ref 10–30)

## 2019-07-11 LAB — SARS CORONAVIRUS 2 (TAT 6-24 HRS): SARS Coronavirus 2: NEGATIVE

## 2019-07-11 LAB — SALICYLATE LEVEL: Salicylate Lvl: <7 mg/dL (ref 2.8–30.0)

## 2019-07-11 MED ORDER — DIVALPROEX SODIUM 500 MG PO DR TAB
1000.0000 mg | DELAYED_RELEASE_TABLET | Freq: Every day | ORAL | Status: DC
  Administered 2019-07-11 – 2019-07-13 (×3): 1000 mg via ORAL
  Filled 2019-07-11 (×3): qty 2

## 2019-07-11 MED ORDER — QUETIAPINE FUMARATE 300 MG PO TABS
300.0000 mg | ORAL_TABLET | Freq: Every day | ORAL | Status: DC
  Administered 2019-07-11 – 2019-07-13 (×3): 300 mg via ORAL
  Filled 2019-07-11 (×3): qty 1

## 2019-07-11 NOTE — Progress Notes (Signed)
Disposition:Jason Gwenlyn Found, NP recommends inpt tx. TTS to seek placement.  CSW faxed referrals to the following:   [facility]     [facility]        CSW will continue to assist with placement needs.   Netta Neat, MSW, LCSW Clinical Social Work
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
## 2019-07-11 NOTE — Progress Notes (Signed)
Disposition: Lindon Romp, NP recommends inpt tx. TTS to seek placement. Mortimer Fries, RN and EDP Dr. Leonette Monarch, MD have been advised.  Lind Covert, MSW, LCSW Therapeutic Triage Specialist  [phone]

## 2019-07-11 NOTE — ED Provider Notes (Signed)
[facility]-EMERGENCY DEPT Provider Note  CSN: [id number] Arrival date & time: 07/10/19 2339  Chief Complaint(s) Medical Clearance and Psychiatric Evaluation  HPI Albert [name] is a 43 y.o. male   The history is provided by the patient.  Mental Health Problem Presenting symptoms: disorganized speech, disorganized thought process and paranoid behavior   Degree of incapacity (severity):  Moderate Onset quality:  Gradual Duration: unsure.  Remainder of history, ROS, and physical exam limited due to patient's condition (psychiatric disorder).  Level V Caveat.   Patient stating that he was told be check after being kicked out of "[facility]" for "starring at children."    Past Medical History Past Medical History:  Diagnosis Date  . Alcoholism (HCC)   . Drug abuse (HCC)    xanax addiction 5 years ago  . Medical non-compliance   . Paranoid schizophrenia (HCC)   . Schizophrenia (HCC)   . Seizures [facility])    Patient Active Problem List   Diagnosis Date Noted  . Bipolar disorder (HCC) 01/24/2019  . Schizoaffective disorder (HCC) 01/23/2019  . Paranoid schizophrenia (HCC) 06/05/2018  . Alcohol use disorder, moderate, in sustained remission (HCC) 07/09/2017  . Cannabis use disorder, moderate, in sustained remission (HCC) 07/09/2017  . Elevated TSH 01/05/2017  . Tachycardia 01/05/2017  . Schizoaffective disorder, bipolar type (HCC) 01/04/2017  . Alcoholism (HCC)   . Drug abuse (HCC)   . Medical non-compliance    Home Medication(s) Prior to Admission medications   Medication Sig Start Date End Date Taking? Authorizing Provider  divalproex (DEPAKOTE) 500 MG DR tablet Take 1,000 mg by mouth at bedtime.   Yes [provider]  ibuprofen (ADVIL) 800 MG tablet Take 800 mg by mouth every 8 (eight) hours as needed for moderate pain.   Yes [provider]  omeprazole (PRILOSEC) 40 MG capsule Take 40 mg by mouth daily as needed (heart burn).    Yes [provider]  QUEtiapine (SEROQUEL) 300 MG tablet Take 300 mg by mouth at bedtime.   Yes [provider]  divalproex (DEPAKOTE ER) 500 MG 24 hr tablet 1 in am 3 at h s Patient not taking: Reported on 07/11/2019 02/02/19   Malvin Johns, MD                                                                                                                                    Past Surgical History History reviewed. No pertinent surgical history. Family History Family History  Problem Relation Age of Onset  . Thyroid disease Mother   . Depression Maternal Aunt     Social History Social History   Tobacco Use  . Smoking status: Never Smoker  . Smokeless tobacco: Never Used  . Tobacco comment: Non-smoker  Substance Use Topics  . Alcohol use: Not Currently  . Drug use: Not Currently    Types: Marijuana    Comment: xanax- several years ago  Allergies Patient has no known allergies.  Review of Systems Review of Systems  Psychiatric/Behavioral: Positive for paranoia.    Physical Exam Vital Signs  I have reviewed the triage vital signs BP 125/88 (BP Location: Right Arm)   Pulse 61   Temp 98.3 F (36.8 C) (Oral)   Resp 18   SpO2 96%   Physical Exam Vitals signs reviewed.  Constitutional:      General: He is not in acute distress.    Appearance: He is well-developed. He is not diaphoretic.  HENT:     Head: Normocephalic and atraumatic.     Jaw: No trismus.     Right Ear: External ear normal.     Left Ear: External ear normal.     Nose: Nose normal.  Eyes:     General: No scleral icterus.    Conjunctiva/sclera: Conjunctivae normal.  Neck:     Musculoskeletal: Normal range of motion.     Trachea: Phonation normal.  Cardiovascular:     Rate and Rhythm: Normal rate and regular rhythm.  Pulmonary:     Effort: Pulmonary effort is normal. No respiratory distress.     Breath sounds: No stridor.  Abdominal:     General: There is no distension.   Musculoskeletal: Normal range of motion.  Neurological:     Mental Status: He is alert and oriented to [name], place, and time.  Psychiatric:        Mood and Affect: Mood normal.        Speech: Speech is tangential.        Behavior: Behavior normal.        Thought Content: Thought content is paranoid.     ED Results and Treatments Labs (all labs ordered are listed, but only abnormal results are displayed) Labs Reviewed  ACETAMINOPHEN LEVEL - Abnormal; Notable for the following components:      Result Value   Acetaminophen (Tylenol), Serum <10 (*)    All other components within normal limits  SARS CORONAVIRUS 2 (TAT 6-24 HRS)  COMPREHENSIVE METABOLIC PANEL  ETHANOL  SALICYLATE LEVEL  CBC  RAPID URINE DRUG SCREEN, HOSP PERFORMED                                                                                                                         EKG  EKG Interpretation  Date/Time:    Ventricular Rate:    PR Interval:    QRS Duration:   QT Interval:    QTC Calculation:   R Axis:     Text Interpretation:        Radiology No results found.  Pertinent labs & imaging results that were available during my care of the patient were reviewed by me and considered in my medical decision making (see chart for details).  Medications Ordered in ED Medications  divalproex (DEPAKOTE) DR tablet 1,000 mg (has no administration in time range)  QUEtiapine (SEROQUEL) tablet 300 mg (has no administration in time range)  Procedures Procedures  (including critical care time)  Medical Decision Making / ED Course I have reviewed the nursing notes for this encounter and the patient's prior records (if available in EHR or on provided paperwork).   Albert [name] was evaluated in Emergency Department on 07/11/2019 for the symptoms described in the  history of present illness. He was evaluated in the context of the global COVID-19 pandemic, which necessitated consideration that the patient might be at risk for infection with the SARS-CoV-2 virus that causes COVID-19. Institutional protocols and algorithms that pertain to the evaluation of patients at risk for COVID-19 are in a state of rapid change based on information released by regulatory bodies including the CDC and federal and state organizations. These policies and algorithms were followed during the patient's care in the ED.  Patient appears to have disorganized thought process concerning for mania versus decompensated schizophrenia.  TTS evaluation.  They recommended inpatient management.      Final Clinical Impression(s) / ED Diagnoses Final diagnoses:  Disorganized thought process      This chart was dictated using voice recognition software.  Despite best efforts to proofread,  errors can occur which can change the documentation meaning.   Nira Connardama, Pedro Eduardo, MD 07/11/19 [phone]

## 2019-07-11 NOTE — Progress Notes (Addendum)
Patient ID: Albert [name], male   DOB: [birth year], 43 y.o.   MRN: [id number]  Pt was seen and chart reviewed with treatment team. Pt is disorganized and circumstantial in his thought process. Pt is well known to this hospital system and often presents in this manner when he has stopped taking his medications. Pt will be recommended for an inpatient admission. Home medications restarted. TTS to seek placement.    Ethelene Hal, PMHNP-BC 07/11/2019     1140  Patient seen face-to-face for psychiatric evaluation, chart reviewed and case discussed with the physician extender and developed treatment plan. Reviewed the information documented and agree with the treatment plan. Corena Pilgrim, MD

## 2019-07-12 DIAGNOSIS — F1910 Other psychoactive substance abuse, uncomplicated: Secondary | ICD-10-CM

## 2019-07-12 DIAGNOSIS — F419 Anxiety disorder, unspecified: Secondary | ICD-10-CM

## 2019-07-12 DIAGNOSIS — F250 Schizoaffective disorder, bipolar type: Secondary | ICD-10-CM

## 2019-07-12 DIAGNOSIS — G4700 Insomnia, unspecified: Secondary | ICD-10-CM

## 2019-07-12 DIAGNOSIS — F209 Schizophrenia, unspecified: Secondary | ICD-10-CM | POA: Diagnosis not present

## 2019-07-12 MED ORDER — HYDROXYZINE HCL 25 MG PO TABS: 25.0000 mg | ORAL_TABLET | Freq: Three times a day (TID) | ORAL | Status: DC | PRN

## 2019-07-12 NOTE — ED Notes (Signed)
Pt A&O x 4, taking a shower at present, no distress noted, calm & cooperative.  Interactive with staff.  Monitoring for safety.

## 2019-07-12 NOTE — Progress Notes (Signed)
Requested [facility] TTS review patient for possible admission.  No available beds at this time.  Will notify [facility] if bed becomes available.

## 2019-07-12 NOTE — Consult Note (Signed)
Telepsych Consultation   Reason for Consult:  ''Hostile, paranoid and disorganized.'' Referring Physician:  EDP Location of Patient: [facility]  Patient Identification: Albert LeftChristopher [name] MRN:  [id number] Principal Diagnosis: Schizoaffective disorder, bipolar type (HCC) Diagnosis:  Principal Problem:   Schizoaffective disorder, bipolar type (HCC) Active Problems:   Schizoaffective disorder (HCC)   Total Time spent with patient: 45 minutes  Subjective:   Albert LeftChristopher [name] is a 43 y.o. male patient admitted with paranoia and disorganized thought process.  HPI:  Patient with history of Schizoaffective disorder-bipolar type, Paranoid Schizophrenia and substance abuse who presented to the hospital Voluntarily with agitation, paranoia and disorganized though process. Patient reports that he did not take his medications for a few days after which he had trouble sleeping and emotional lability. Today, patient continues to have mood swings, disorganized thought process and paranoia. He is confrontational, defiant, oppositional, demanding and verbally aggressive. He denies suicidal/homicidal ideations, intent or plan.  Past Psychiatric History: as above  Risk to Self: Suicidal Ideation: No Suicidal Intent: No Is patient at risk for suicide?: No Suicidal Plan?: No Access to Means: No What has been your use of drugs/alcohol within the last 12 months?: denies How many times?: 1 Triggers for Past Attempts: Unpredictable Intentional Self Injurious Behavior: None Risk to Others: Homicidal Ideation: No Thoughts of Harm to Others: No Current Homicidal Intent: No Current Homicidal Plan: No Access to Homicidal Means: No History of harm to others?: No Assessment of Violence: None Noted Does patient have access to weapons?: No Criminal Charges Pending?: No Does patient have a court date: No Prior Inpatient Therapy: Prior Inpatient Therapy: Yes Prior  Therapy Dates: 2020, 2018, 2017, 2013 [facility] Reason for Treatment: SCHIZOPHRENIA Prior Outpatient Therapy: Prior Outpatient Therapy: Yes Prior Therapy Dates: ONGOING Prior Therapy Facilty/Provider(s): DR. Betti CruzEDDY, MD Reason for Treatment: MED MANAGEMENT Does patient have an ACCT team?: No Does patient have Intensive In-House Services?  : No Does patient have [facility] services? : No Does patient have P4CC services?: No  Past Medical History:  Past Medical History:  Diagnosis Date  . Alcoholism (HCC)   . Drug abuse (HCC)    xanax addiction 5 years ago  . Medical non-compliance   . Paranoid schizophrenia (HCC)   . Schizophrenia (HCC)   . Seizures (HCC)    History reviewed. No pertinent surgical history. Family History:  Family History  Problem Relation Age of Onset  . Thyroid disease Mother   . Depression Maternal Aunt    Family Psychiatric  History:  Social History:  Social History   Substance and Sexual Activity  Alcohol Use Not Currently     Social History   Substance and Sexual Activity  Drug Use Not Currently  . Types: Marijuana   Comment: xanax- several years ago    Social History   Socioeconomic History  . Marital status: Single    Spouse name: Not on file  . Number of children: Not on file  . Years of education: Not on file  . Highest education level: Not on file  Occupational History  . Not on file  Social Needs  . Financial resource strain: Not on file  . Food insecurity    Worry: Not on file    Inability: Not on file  . Transportation needs    Medical: Not on file    Non-medical: Not on file  Tobacco Use  . Smoking status: Never Smoker  . Smokeless tobacco: Never Used  .  Tobacco comment: Non-smoker  Substance and Sexual Activity  . Alcohol use: Not Currently  . Drug use: Not Currently    Types: Marijuana    Comment: xanax- several years ago  . Sexual activity: Not Currently  Lifestyle  . Physical activity     Days per week: Not on file    Minutes per session: Not on file  . Stress: Not on file  Relationships  . Social Musician on phone: Not on file    Gets together: Not on file    Attends religious service: Not on file    Active member of club or organization: Not on file    Attends meetings of clubs or organizations: Not on file    Relationship status: Not on file  Other Topics Concern  . Not on file  Social History Narrative   ** Merged History Encounter **       Additional Social History:    Allergies:  No Known Allergies  Labs:  Results for orders placed or performed during the hospital encounter of 07/10/19 (from the past 48 hour(s))  Comprehensive metabolic panel     Status: None   Collection Time: 07/11/19  1:12 AM  Result Value Ref Range   Sodium 137 135 - 145 mmol/L   Potassium 4.3 3.5 - 5.1 mmol/L   Chloride 105 98 - 111 mmol/L   CO2 24 22 - 32 mmol/L   Glucose, Bld 85 70 - 99 mg/dL   BUN 14 6 - 20 mg/dL   Creatinine, Ser 4.62 0.61 - 1.24 mg/dL   Calcium 9.4 8.9 - 70.3 mg/dL   Total Protein 7.9 6.5 - 8.1 g/dL   Albumin 4.5 3.5 - 5.0 g/dL   AST 24 15 - 41 U/L   ALT 22 0 - 44 U/L   Alkaline Phosphatase 51 38 - 126 U/L   Total Bilirubin 0.8 0.3 - 1.2 mg/dL   GFR calc non Af Amer >60 >60 mL/min   GFR calc Af Amer >60 >60 mL/min   Anion gap 8 5 - 15    Comment: Performed at [facility], [address] [place], [place] [address]  Ethanol     Status: None   Collection Time: 07/11/19  1:12 AM  Result Value Ref Range   Alcohol, Ethyl (B) <10 <10 mg/dL    Comment: (NOTE) Lowest detectable limit for serum alcohol is 10 mg/dL. For medical purposes only. Performed at [facility], [address], [place], [place] [address]   Salicylate level     Status: None   Collection Time: 07/11/19  1:12 AM  Result Value Ref Range   Salicylate Lvl <7.0 2.8 - 30.0 mg/dL    Comment: Performed at [facility],  [address], [place], [place] [address]  Acetaminophen level     Status: Abnormal   Collection Time: 07/11/19  1:12 AM  Result Value Ref Range   Acetaminophen (Tylenol), Serum <10 (L) 10 - 30 ug/mL    Comment: (NOTE) Therapeutic concentrations vary significantly. A range of 10-30 ug/mL  may be an effective concentration for many patients. However, some  are best treated at concentrations outside of this range. Acetaminophen concentrations >150 ug/mL at 4 hours after ingestion  and >50 ug/mL at 12 hours after ingestion are often associated with  toxic reactions. Performed at [facility], [address] [place], [place] [address]   cbc     Status: None  Collection Time: 07/11/19  1:12 AM  Result Value Ref Range   WBC 8.7 4.0 - 10.5 K/uL   RBC 4.78 4.22 - 5.81 MIL/uL   Hemoglobin 15.9 13.0 - 17.0 g/dL   HCT 46.0 39.0 - 52.0 %   MCV 96.2 80.0 - 100.0 fL   MCH 33.3 26.0 - 34.0 pg   MCHC 34.6 30.0 - 36.0 g/dL   RDW 13.2 11.5 - 15.5 %   Platelets 296 150 - 400 K/uL   nRBC 0.0 0.0 - 0.2 %    Comment: Performed at [facility], [address], [place], [place] [address]  Rapid urine drug screen (hospital performed)     Status: None   Collection Time: 07/11/19  1:12 AM  Result Value Ref Range   Opiates NONE DETECTED NONE DETECTED   Cocaine NONE DETECTED NONE DETECTED   Benzodiazepines NONE DETECTED NONE DETECTED   Amphetamines NONE DETECTED NONE DETECTED   Tetrahydrocannabinol NONE DETECTED NONE DETECTED   Barbiturates NONE DETECTED NONE DETECTED    Comment: (NOTE) DRUG SCREEN FOR MEDICAL PURPOSES ONLY.  IF CONFIRMATION IS NEEDED FOR ANY PURPOSE, NOTIFY LAB WITHIN 5 DAYS. LOWEST DETECTABLE LIMITS FOR URINE DRUG SCREEN Drug Class                     Cutoff (ng/mL) Amphetamine and metabolites    1000 Barbiturate and metabolites    200 Benzodiazepine                 761 Tricyclics and metabolites     300 Opiates and metabolites         300 Cocaine and metabolites        300 THC                            50 Performed at [facility], [place] [address] [place], [place] [address]   SARS CORONAVIRUS 2 (TAT 6-24 HRS) Nasopharyngeal     Status: None   Collection Time: 07/11/19  1:55 AM   Specimen: Nasopharyngeal  Result Value Ref Range   SARS Coronavirus 2 NEGATIVE NEGATIVE    Comment: (NOTE) SARS-CoV-2 target nucleic acids are NOT DETECTED. The SARS-CoV-2 RNA is generally detectable in upper and lower respiratory specimens during the acute phase of infection. Negative results do not preclude SARS-CoV-2 infection, do not rule out co-infections with other pathogens, and should not be used as the sole basis for treatment or other patient management decisions. Negative results must be combined with clinical observations, patient history, and epidemiological information. The expected result is Negative. Fact Sheet for Patients: SugarRoll.be Fact Sheet for Healthcare Providers: https://www.woods-mathews.com/ This test is not yet approved or cleared by the [place] FDA and  has been authorized for detection and/or diagnosis of SARS-CoV-2 by FDA under an Emergency Use Authorization (EUA). This EUA will remain  in effect (meaning this test can be used) for the duration of the COVID-19 declaration under Section 56 4(b)(1) of the Act, 21 U.S.C. section 360bbb-3(b)(1), unless the authorization is terminated or revoked sooner. Performed at [facility], [place] [address] [place],  [address]     Medications:  Current Facility-Administered Medications  Medication Dose Route Frequency Provider Last Rate Last Dose  . divalproex (DEPAKOTE) DR tablet 1,000 mg  1,000 mg Oral QHS Cardama, Grayce Sessions, MD   1,000 mg at 07/11/19 2101  . hydrOXYzine (ATARAX/VISTARIL) tablet 25 mg  25 mg Oral TID PRN Thedore MinsAkintayo, Meily Glowacki, MD      . QUEtiapine (SEROQUEL) tablet 300  mg  300 mg Oral QHS Cardama, Amadeo GarnetPedro Eduardo, MD   300 mg at 07/11/19 2101   Current Outpatient Medications  Medication Sig Dispense Refill  . divalproex (DEPAKOTE) 500 MG DR tablet Take 1,000 mg by mouth at bedtime.    Marland Kitchen. ibuprofen (ADVIL) 800 MG tablet Take 800 mg by mouth every 8 (eight) hours as needed for moderate pain.    Marland Kitchen. omeprazole (PRILOSEC) 40 MG capsule Take 40 mg by mouth daily as needed (heart burn).    . QUEtiapine (SEROQUEL) 300 MG tablet Take 300 mg by mouth at bedtime.    . divalproex (DEPAKOTE ER) 500 MG 24 hr tablet 1 in am 3 at h s (Patient not taking: Reported on 07/11/2019) 120 tablet 2    Musculoskeletal: Strength & Muscle Tone: not tested Gait & Station: not tested Patient leans: N/A  Psychiatric Specialty Exam: Physical Exam  Psychiatric: His affect is angry and labile. His speech is rapid and/or pressured and tangential. He is agitated and aggressive. Thought content is paranoid. Cognition and memory are normal. He expresses impulsivity.    Review of Systems  Constitutional: Negative.   HENT: Negative.   Eyes: Negative.   Respiratory: Negative.   Cardiovascular: Negative.   Gastrointestinal: Negative.   Genitourinary: Negative.   Skin: Negative.   Neurological: Negative.   Endo/Heme/Allergies: Negative.   Psychiatric/Behavioral: Positive for hallucinations. The patient has insomnia.     Blood pressure 102/60, pulse 72, temperature (!) 97.5 F (36.4 C), temperature source Oral, resp. rate 18, height 5\' 10"  (1.778 m), weight 97.5 kg, SpO2 94 %.Body mass index is 30.85 kg/m.  General Appearance: Casual  Eye Contact:  Good  Speech:  Clear and Coherent and loud  Volume:  Increased  Mood:  Angry and Irritable  Affect:  Labile  Thought Process:  Disorganized  Orientation:  Full (Time, Place, and Person)  Thought Content:  Paranoid Ideation and Tangential  Suicidal Thoughts:  No  Homicidal Thoughts:  No  Memory:  Immediate;   Fair Recent;   Fair Remote;    Fair  Judgement:  Poor  Insight:  Lacking  Psychomotor Activity:  Increased  Concentration:  Concentration: Fair and Attention Span: Fair  Recall:  F[employer]ervFair  Fund of Knowledge:  Fair  Language:  Good  Akathisia:  No  Handed:  Right  AIMS (if indicated):     Assets:  Communication Skills  ADL's:  Intact  Cognition:  WNL  Sleep:   fair     Treatment Plan Summary: Daily contact with patient to assess and evaluate symptoms and progress in treatment and Medication management  -Continue Depakote 1000 mg at bedtime for bipolar/mood -Continue Seroquel 300 mg at bedtime for delusions -Add Hydroxyzine 25 mg TID PRN for agitation/anxiety  Disposition: Recommend psychiatric Inpatient admission when medically cleared. Supportive therapy provided about ongoing stressors.  This service was provided via telemedicine using a 2-way, interactive audio and video technology.  Names of all persons participating in this telemedicine service and their role in this encounter. Name: Albert [name] Role: Patient   Name: Vianne BullsEdie Marvin Role: RN  Name: Thedore MinsMojeed Duval Macleod, MD Role: Psychiatric   Thedore MinsMojeed Janai Brannigan, MD 07/12/2019 12:09 PM

## 2019-07-12 NOTE — ED Notes (Signed)
2 Pt Belonging Bags [1 Black bookbag & 1 white plastic bag] located in locker 42

## 2019-07-12 NOTE — ED Notes (Signed)
Pt came to unit very irritable.  He did not understand why he was moved from [place].  He calmed down after we talked a while and found something to watch on tv.

## 2019-07-12 NOTE — Progress Notes (Signed)
This patient continues to meet inpatient psychiatric criteria. The following referrals were faxed on 07/11/2019:   [facility]     [facility]     [facility]     [facility]     [place], [place] Social Worker

## 2019-07-13 ENCOUNTER — Encounter (HOSPITAL_COMMUNITY): Payer: Self-pay | Admitting: Registered Nurse

## 2019-07-13 DIAGNOSIS — F1910 Other psychoactive substance abuse, uncomplicated: Secondary | ICD-10-CM

## 2019-07-13 DIAGNOSIS — F209 Schizophrenia, unspecified: Secondary | ICD-10-CM | POA: Diagnosis not present

## 2019-07-13 DIAGNOSIS — Z79899 Other long term (current) drug therapy: Secondary | ICD-10-CM

## 2019-07-13 DIAGNOSIS — Z9114 Patient's other noncompliance with medication regimen: Secondary | ICD-10-CM

## 2019-07-13 DIAGNOSIS — G4700 Insomnia, unspecified: Secondary | ICD-10-CM

## 2019-07-13 DIAGNOSIS — F419 Anxiety disorder, unspecified: Secondary | ICD-10-CM

## 2019-07-13 DIAGNOSIS — R451 Restlessness and agitation: Secondary | ICD-10-CM

## 2019-07-13 DIAGNOSIS — F250 Schizoaffective disorder, bipolar type: Secondary | ICD-10-CM

## 2019-07-13 NOTE — BH Assessment (Signed)
[facility] Assessment Progress Note  Per Buford Dresser, DO, this pt requires psychiatric hospitalization at this time.  At 16:02 Jonelle Sidle calls from [facility].  Pt has been accepted to their facility by Dr Jonelle Sports to the [place]; they will be ready to receive pt after 09:00 tomorrow, 07/14/2019.  Shuvon Rankin, FNP, concurs with this disposition, as does the pt who is currently under voluntary status.  Pt's nurse, Diane, has been notified, and agrees to call report to [phone].  Pt is to be transported via Stacey Drain, [place] Coordinator [phone]

## 2019-07-13 NOTE — Consult Note (Addendum)
Telepsych Consultation   Reason for Consult:  ''Hostile, paranoid and disorganized.'' Referring Physician:  EDP Location of Patient: [facility] Location of Provider: [facility]  Patient Identification: Albert [name] MRN:  [id number] Principal Diagnosis: Schizoaffective disorder, bipolar type (HCC) Diagnosis:  Principal Problem:   Schizoaffective disorder, bipolar type (HCC) Active Problems:   Schizoaffective disorder (HCC)   Total Time spent with patient: 30 minutes  Subjective:   Albert [name] is a 43 y.o. male patient admitted with paranoia and disorganized thought process.  HPI:  Patient with history of Schizoaffective disorder-bipolar type, Paranoid Schizophrenia and substance abuse who presented to the hospital Voluntarily with agitation, paranoia and disorganized thought process. Patient reports that he did not take his medications for a few days after which he had trouble sleeping and emotional lability. Today, patient continues to have mood swings, disorganized thought process and paranoia. He is confrontational, defiant, oppositional, demanding and verbally aggressive. He denies suicidal/homicidal ideations, intent or plan.   Today Tele psych:  Albert [name], 43 y.o., male patient seen via tele psych by this provider, Dr. Sharma Covert; and chart reviewed on 07/13/19.  On evaluation Albert [name] continues to present disorganized, rambling, irrelevant information when asked questions.  "I was walking down [address]; thought something was wrong with the traffic lights so went into the hospital and asked if anything was wrong with the computers; cause something is not right.; but patient continues to deny suicidal/homicidal ideation, psychosis, and paranoia.   Will continue to seek inpatient psychiatric treatment   Past Psychiatric History: as above  Risk to Self: Suicidal Ideation: No Suicidal Intent: No Is patient at risk for suicide?: No Suicidal  Plan?: No Access to Means: No What has been your use of drugs/alcohol within the last 12 months?: denies How many times?: 1 Triggers for Past Attempts: Unpredictable Intentional Self Injurious Behavior: None Risk to Others: Homicidal Ideation: No Thoughts of Harm to Others: No Current Homicidal Intent: No Current Homicidal Plan: No Access to Homicidal Means: No History of harm to others?: No Assessment of Violence: None Noted Does patient have access to weapons?: No Criminal Charges Pending?: No Does patient have a court date: No Prior Inpatient Therapy: Prior Inpatient Therapy: Yes Prior Therapy Dates: 2020, 2018, 2017, 2013 Prior Therapy Facilty/Provider(s): [facility] Reason for Treatment: SCHIZOPHRENIA Prior Outpatient Therapy: Prior Outpatient Therapy: Yes Prior Therapy Dates: ONGOING Prior Therapy Facilty/Provider(s): DR. Betti Cruz, MD Reason for Treatment: MED MANAGEMENT Does patient have an ACCT team?: No Does patient have Intensive In-House Services?  : No Does patient have [facility] services? : No Does patient have P4CC services?: No  Past Medical History:  Past Medical History:  Diagnosis Date  . Alcoholism (HCC)   . Drug abuse (HCC)    xanax addiction 5 years ago  . Medical non-compliance   . Paranoid schizophrenia (HCC)   . Schizophrenia (HCC)   . Seizures (HCC)    History reviewed. No pertinent surgical history. Family History:  Family History  Problem Relation Age of Onset  . Thyroid disease Mother   . Depression Maternal Aunt    Family Psychiatric  History: As listed above.  Social History:  Social History   Substance and Sexual Activity  Alcohol Use Not Currently     Social History   Substance and Sexual Activity  Drug Use Not Currently  . Types: Marijuana   Comment: xanax- several years ago    Social History   Socioeconomic History  . Marital status: Single    Spouse name: Not on  file  . Number of children: Not on file  . Years of education: Not  on file  . Highest education level: Not on file  Occupational History  . Not on file  Social Needs  . Financial resource strain: Not on file  . Food insecurity    Worry: Not on file    Inability: Not on file  . Transportation needs    Medical: Not on file    Non-medical: Not on file  Tobacco Use  . Smoking status: Never Smoker  . Smokeless tobacco: Never Used  . Tobacco comment: Non-smoker  Substance and Sexual Activity  . Alcohol use: Not Currently  . Drug use: Not Currently    Types: Marijuana    Comment: xanax- several years ago  . Sexual activity: Not Currently  Lifestyle  . Physical activity    Days per week: Not on file    Minutes per session: Not on file  . Stress: Not on file  Relationships  . Social Musicianconnections    Talks on phone: Not on file    Gets together: Not on file    Attends religious service: Not on file    Active member of club or organization: Not on file    Attends meetings of clubs or organizations: Not on file    Relationship status: Not on file  Other Topics Concern  . Not on file  Social History Narrative   ** Merged History Encounter **       Additional Social History: N/A    Allergies:  No Known Allergies  Labs:  No results found for this or any previous visit (from the past 48 hour(s)).  Medications:  Current Facility-Administered Medications  Medication Dose Route Frequency Provider Last Rate Last Dose  . divalproex (DEPAKOTE) DR tablet 1,000 mg  1,000 mg Oral QHS Cardama, Amadeo GarnetPedro Eduardo, MD   1,000 mg at 07/12/19 2133  . hydrOXYzine (ATARAX/VISTARIL) tablet 25 mg  25 mg Oral TID PRN Thedore MinsAkintayo, Mojeed, MD      . QUEtiapine (SEROQUEL) tablet 300 mg  300 mg Oral QHS Cardama, Amadeo GarnetPedro Eduardo, MD   300 mg at 07/12/19 2134   Current Outpatient Medications  Medication Sig Dispense Refill  . divalproex (DEPAKOTE) 500 MG DR tablet Take 1,000 mg by mouth at bedtime.    Marland Kitchen. ibuprofen (ADVIL) 800 MG tablet Take 800 mg by mouth every 8 (eight)  hours as needed for moderate pain.    Marland Kitchen. omeprazole (PRILOSEC) 40 MG capsule Take 40 mg by mouth daily as needed (heart burn).    . QUEtiapine (SEROQUEL) 300 MG tablet Take 300 mg by mouth at bedtime.    . divalproex (DEPAKOTE ER) 500 MG 24 hr tablet 1 in am 3 at h s (Patient not taking: Reported on 07/11/2019) 120 tablet 2    Musculoskeletal: Strength & Muscle Tone: No atrophy noted Gait & Station: not tested Patient leans: N/A  Psychiatric Specialty Exam: Physical Exam  Nursing note and vitals reviewed. Psychiatric: His affect is angry and labile. His speech is rapid and/or pressured and tangential. Thought content is paranoid. Cognition and memory are normal. He expresses impulsivity.    Review of Systems  Constitutional: Negative.   HENT: Negative.   Eyes: Negative.   Respiratory: Negative.   Cardiovascular: Negative.   Gastrointestinal: Negative.   Genitourinary: Negative.   Skin: Negative.   Neurological: Negative.   Endo/Heme/Allergies: Negative.   Psychiatric/Behavioral: Positive for hallucinations. The patient has insomnia.     Blood pressure  123/72, pulse 75, temperature (!) 97.5 F (36.4 C), temperature source Oral, resp. rate 18, height 5\' 10"  (1.778 m), weight 97.5 kg, SpO2 98 %.Body mass index is 30.85 kg/m.  General Appearance: Casual  Eye Contact:  Good  Speech:  Clear and Coherent and loud  Volume:  Increased  Mood:  Hopeless, Irritable and calm cooperative at this time  Affect:  Labile  Thought Process:  Disorganized and Descriptions of Associations: Tangential  Orientation:  Full (Time, Place, and Person)  Thought Content:  Paranoid Ideation and Tangential  Suicidal Thoughts:  No  Homicidal Thoughts:  No  Memory:  Immediate;   Fair Recent;   Fair Remote;   Fair  Judgement:  Poor  Insight:  Lacking  Psychomotor Activity:  Increased  Concentration:  Concentration: Fair and Attention Span: Fair  Recall:  [employer] of Knowledge:  Fair  Language:  Good   Akathisia:  No  Handed:  Right  AIMS (if indicated):   N/A  Assets:  Physical Health Resilience  ADL's:  Intact  Cognition:  WNL  Sleep:   fair     Treatment Plan Summary: Daily contact with patient to assess and evaluate symptoms and progress in treatment and Medication management  -Continue Depakote 1000 mg at bedtime for bipolar/mood -Continue Seroquel 300 mg at bedtime for delusions -Continue Hydroxyzine 25 mg TID PRN for agitation/anxiety  Disposition: Recommend psychiatric Inpatient admission when medically cleared. Supportive therapy provided about ongoing stressors.   Continue current treatment plan to seek inpatient psychiatric treatment; no changes in medications  This service was provided via telemedicine using a 2-way, interactive audio and video technology.  Names of all persons participating in this telemedicine service and their role in this encounter. Name: Albert [name] Role: Patient   Name: Earleen Newport Role: NP  Name: Dr. Mariea Clonts Role: Psychiatrist   Earleen Newport, NP 07/13/2019 3:47 PM   Patient seen by telemedicine for psychiatric evaluation, chart reviewed and case discussed with the physician extender and developed treatment plan. Reviewed the information documented and agree with the treatment plan.  Buford Dresser, DO 07/13/19 11:57 PM

## 2019-07-13 NOTE — ED Notes (Signed)
Pt currently agrees that he will go to [facility], "If that is what they think that I need."

## 2019-07-13 NOTE — Care Management (Signed)
ED CM spoke with Dyann Ruddle at [facility]. She asked if the patient still needs placement. Informed it appears the patient has been placed.

## 2019-07-13 NOTE — Discharge Summary (Addendum)
  Patient is to be transferred to [facility] for inpatient psychiatric treatment 07/14/19  Patient seen by telemedicine for psychiatric evaluation, chart reviewed and case discussed with the physician extender and developed treatment plan. Reviewed the information documented and agree with the treatment plan.  Buford Dresser, DO 07/13/19 11:45 PM

## 2019-07-13 NOTE — ED Notes (Signed)
Pt is demanding, rude with staff.

## 2019-07-14 NOTE — Discharge Summary (Addendum)
  Patient is to be transferred to [facility] for inpatient psychiatric treatment  Patient's chart reviewed. Reviewed the information documented and agree with the treatment plan.  Buford Dresser, DO 07/14/19 5:48 PM

## 2019-07-14 NOTE — Progress Notes (Signed)
Received Albert [name] last PM in his room watching TV. Later he was compliant with his medications and received the snack of his choice.He slept throughout the night without incident,

## 2019-07-14 NOTE — ED Notes (Signed)
Pt off unit to [facility] per provider.  Pt Voluntary, alert, calm, cooperative, no s/s of distress.  DC information given to [employer] driver for [facility]. Belongings given to [employer] driver for facility. Pt ambulatory off unit,  Escorted by RN. [facility] transport

## 2019-07-23 ENCOUNTER — Telehealth (HOSPITAL_COMMUNITY): Payer: Self-pay | Admitting: Professional

## 2019-08-16 ENCOUNTER — Encounter (HOSPITAL_COMMUNITY): Payer: Self-pay

## 2019-08-16 ENCOUNTER — Other Ambulatory Visit: Payer: Self-pay

## 2019-08-16 ENCOUNTER — Emergency Department (HOSPITAL_COMMUNITY)
Admission: EM | Admit: 2019-08-16 | Discharge: 2019-08-17 | Disposition: A | Discharge status: Other Acute Inpatient Hospital | Payer: Medicare Other | Attending: Emergency Medicine | Admitting: Emergency Medicine

## 2019-08-16 DIAGNOSIS — F209 Schizophrenia, unspecified: Secondary | ICD-10-CM | POA: Diagnosis not present

## 2019-08-16 DIAGNOSIS — F22 Delusional disorders: Secondary | ICD-10-CM

## 2019-08-16 DIAGNOSIS — Z046 Encounter for general psychiatric examination, requested by authority: Secondary | ICD-10-CM | POA: Diagnosis not present

## 2019-08-16 DIAGNOSIS — Z20828 Contact with and (suspected) exposure to other viral communicable diseases: Secondary | ICD-10-CM | POA: Diagnosis not present

## 2019-08-16 DIAGNOSIS — R4189 Other symptoms and signs involving cognitive functions and awareness: Secondary | ICD-10-CM

## 2019-08-16 LAB — SALICYLATE LEVEL: Salicylate Lvl: <7 mg/dL (ref 2.8–30.0)

## 2019-08-16 LAB — ACETAMINOPHEN LEVEL: Acetaminophen (Tylenol), Serum: <10 ug/mL — ABNORMAL LOW (ref 10–30)

## 2019-08-16 LAB — CBC
HCT: 42.3 % (ref 39.0–52.0)
Hemoglobin: 14.9 g/dL (ref 13.0–17.0)
MCH: 33.6 pg (ref 26.0–34.0)
MCHC: 35.2 g/dL (ref 30.0–36.0)
MCV: 95.3 fL (ref 80.0–100.0)
Platelets: 328 10*3/uL (ref 150–400)
RBC: 4.44 MIL/uL (ref 4.22–5.81)
RDW: 13.4 % (ref 11.5–15.5)
WBC: 8 10*3/uL (ref 4.0–10.5)
nRBC: 0 % (ref 0.0–0.2)

## 2019-08-16 LAB — COMPREHENSIVE METABOLIC PANEL
ALT: 22 U/L (ref 0–44)
AST: 25 U/L (ref 15–41)
Albumin: 4 g/dL (ref 3.5–5.0)
Alkaline Phosphatase: 49 U/L (ref 38–126)
Anion gap: 9 (ref 5–15)
BUN: 7 mg/dL (ref 6–20)
CO2: 24 mmol/L (ref 22–32)
Calcium: 9 mg/dL (ref 8.9–10.3)
Chloride: 104 mmol/L (ref 98–111)
Creatinine, Ser: 0.9 mg/dL (ref 0.61–1.24)
GFR calc Af Amer: >60 mL/min (ref >60)
GFR calc non Af Amer: >60 mL/min (ref >60)
Glucose, Bld: 103 mg/dL — ABNORMAL HIGH (ref 70–99)
Potassium: 3.9 mmol/L (ref 3.5–5.1)
Sodium: 137 mmol/L (ref 135–145)
Total Bilirubin: 0.6 mg/dL (ref 0.3–1.2)
Total Protein: 7.1 g/dL (ref 6.5–8.1)

## 2019-08-16 LAB — RAPID URINE DRUG SCREEN, HOSP PERFORMED
Amphetamines: NOT DETECTED
Barbiturates: NOT DETECTED
Benzodiazepines: NOT DETECTED
Cocaine: NOT DETECTED
Opiates: NOT DETECTED
Tetrahydrocannabinol: NOT DETECTED

## 2019-08-16 LAB — SARS CORONAVIRUS 2 (TAT 6-24 HRS): SARS Coronavirus 2: NEGATIVE

## 2019-08-16 LAB — ETHANOL: Alcohol, Ethyl (B): <10 mg/dL (ref <10)

## 2019-08-16 LAB — VALPROIC ACID LEVEL: Valproic Acid Lvl: 59 ug/mL (ref 50.0–100.0)

## 2019-08-16 MED ORDER — PANTOPRAZOLE SODIUM 40 MG PO TBEC
40.0000 mg | DELAYED_RELEASE_TABLET | Freq: Every day | ORAL | Status: DC
  Administered 2019-08-16 – 2019-08-17 (×2): 40 mg via ORAL
  Filled 2019-08-16 (×2): qty 1

## 2019-08-16 MED ORDER — ONDANSETRON HCL 4 MG PO TABS: 4.0000 mg | ORAL_TABLET | Freq: Three times a day (TID) | ORAL | Status: DC | PRN

## 2019-08-16 MED ORDER — DIVALPROEX SODIUM 250 MG PO DR TAB
1000.0000 mg | DELAYED_RELEASE_TABLET | Freq: Every day | ORAL | Status: DC
  Administered 2019-08-16: 1000 mg via ORAL
  Filled 2019-08-16: qty 4

## 2019-08-16 MED ORDER — QUETIAPINE FUMARATE 50 MG PO TABS
300.0000 mg | ORAL_TABLET | Freq: Every day | ORAL | Status: DC
  Administered 2019-08-16: 300 mg via ORAL
  Filled 2019-08-16: qty 1
  Filled 2019-08-16: qty 2
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
## 2019-08-16 NOTE — Progress Notes (Signed)
Patient has been tentatively accepted at [facility]. [name] (intake at [facility]) will need a copy of his IVC paperwork faxed to [phone]. CSW has made [place], RN aware.   Albert [name]. Albert [name], MSW, [place] Work/Disposition Phone: [phone] Fax: [phone]

## 2019-08-16 NOTE — Progress Notes (Signed)
Patient meets criteria for inpatient treatment. No appropriate or available beds at [facility]. CSW faxed referrals to the following facilities for review:  [facility]   TTS will continue to seek bed placement.  Chalmers Guest. Guerry Bruin, MSW, [place] Work/Disposition Phone: [phone] Fax: [phone]

## 2019-08-16 NOTE — ED Notes (Signed)
[facility] to [facility].

## 2019-08-16 NOTE — ED Triage Notes (Addendum)
Patient has hx of mental illness and cursing this RN in triage. States that I am not listening to his story. Tearful. Will not answer questions related to suicidal/ homicidal thoughts

## 2019-08-16 NOTE — ED Notes (Signed)
[employer] to bedside. 

## 2019-08-16 NOTE — [facility] Counselor (Signed)
Pt roomed; awaiting provider note.

## 2019-08-16 NOTE — ED Notes (Signed)
Pt refusing to leave the door closed

## 2019-08-16 NOTE — ED Notes (Signed)
Pt noted to be talking to Sitter - excessive, repetitive talking noted.

## 2019-08-16 NOTE — ED Notes (Signed)
Pt making phone call. 

## 2019-08-16 NOTE — ED Notes (Signed)
Pt arrived to [facility]m [facility] - ambulatory - wearing burgundy scrubs. Sitter w/pt.  

## 2019-08-16 NOTE — ED Notes (Signed)
Eating meal given.

## 2019-08-16 NOTE — ED Notes (Signed)
Telepsych in process 
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
## 2019-08-17 DIAGNOSIS — F209 Schizophrenia, unspecified: Secondary | ICD-10-CM | POA: Diagnosis not present

## 2019-08-17 NOTE — ED Notes (Signed)
Pt stated he was having abdominal cramps b/c he had "to use the bathroom."  RN showed pt to the bathroom, no problems ambulating.  No further complaints.

## 2019-08-17 NOTE — ED Notes (Signed)
Patient is resting comfortably. 

## 2019-08-17 NOTE — Progress Notes (Signed)
CSW followed up with pt's admission at [facility]. Admissions staff was unavailable to talk right now, but will call TTS back shortly.   Audree Camel, LCSW, [place] Disposition [facility]/TTS [phone]
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
## 2019-08-17 NOTE — [facility] Counselor (Addendum)
Patient is accepted to [facility]. Accepting MD is Dr. Gardenia Phlegm. Call report to [phone]. Patient can come ASAP. Lowella Petties, RN informed of disposition.

## 2019-08-17 NOTE — ED Notes (Signed)
Psychosis-Inpt

## 2019-08-17 NOTE — ED Notes (Signed)
Regular Diet was ordered for Lunch. 

## 2019-08-17 NOTE — ED Notes (Signed)
Patient was given a snack and drink. 

## 2019-08-17 NOTE — ED Notes (Signed)
Breakfast Ordered 

## 2019-08-29 ENCOUNTER — Emergency Department (HOSPITAL_COMMUNITY)
Admission: EM | Admit: 2019-08-29 | Discharge: 2019-08-29 | Disposition: A | Discharge status: Home / Self Care | Payer: Medicare Other

## 2019-09-01 ENCOUNTER — Other Ambulatory Visit: Payer: Self-pay

## 2019-09-01 ENCOUNTER — Encounter (HOSPITAL_COMMUNITY): Payer: Self-pay

## 2019-09-01 ENCOUNTER — Emergency Department (HOSPITAL_COMMUNITY)
Admission: EM | Admit: 2019-09-01 | Discharge: 2019-09-01 | Disposition: A | Discharge status: Other Acute Inpatient Hospital | Payer: Medicare Other | Attending: Emergency Medicine | Admitting: Emergency Medicine

## 2019-09-01 ENCOUNTER — Encounter (HOSPITAL_COMMUNITY): Payer: Self-pay | Admitting: Emergency Medicine

## 2019-09-01 ENCOUNTER — Inpatient Hospital Stay (HOSPITAL_COMMUNITY)
Admission: AD | Admit: 2019-09-01 | Discharge: 2019-09-09 | DRG: 885 | Disposition: A | Discharge status: Home / Self Care | Payer: Medicare Other | Source: Intra-hospital | Attending: Psychiatry | Admitting: Psychiatry

## 2019-09-01 DIAGNOSIS — R410 Disorientation, unspecified: Secondary | ICD-10-CM | POA: Diagnosis not present

## 2019-09-01 DIAGNOSIS — G4700 Insomnia, unspecified: Secondary | ICD-10-CM | POA: Diagnosis not present

## 2019-09-01 DIAGNOSIS — Z20828 Contact with and (suspected) exposure to other viral communicable diseases: Secondary | ICD-10-CM | POA: Diagnosis present

## 2019-09-01 DIAGNOSIS — R002 Palpitations: Secondary | ICD-10-CM | POA: Insufficient documentation

## 2019-09-01 DIAGNOSIS — K219 Gastro-esophageal reflux disease without esophagitis: Secondary | ICD-10-CM | POA: Diagnosis not present

## 2019-09-01 DIAGNOSIS — F99 Mental disorder, not otherwise specified: Secondary | ICD-10-CM | POA: Insufficient documentation

## 2019-09-01 DIAGNOSIS — F200 Paranoid schizophrenia: Principal | ICD-10-CM | POA: Diagnosis present

## 2019-09-01 DIAGNOSIS — F209 Schizophrenia, unspecified: Secondary | ICD-10-CM | POA: Diagnosis present

## 2019-09-01 DIAGNOSIS — Z046 Encounter for general psychiatric examination, requested by authority: Secondary | ICD-10-CM | POA: Insufficient documentation

## 2019-09-01 DIAGNOSIS — F062 Psychotic disorder with delusions due to known physiological condition: Secondary | ICD-10-CM | POA: Diagnosis not present

## 2019-09-01 DIAGNOSIS — Z9114 Patient's other noncompliance with medication regimen: Secondary | ICD-10-CM | POA: Diagnosis not present

## 2019-09-01 DIAGNOSIS — F22 Delusional disorders: Secondary | ICD-10-CM | POA: Diagnosis present

## 2019-09-01 DIAGNOSIS — F29 Unspecified psychosis not due to a substance or known physiological condition: Secondary | ICD-10-CM | POA: Diagnosis not present

## 2019-09-01 LAB — CBC WITH DIFFERENTIAL/PLATELET
Abs Immature Granulocytes: 0.03 10*3/uL (ref 0.00–0.07)
Basophils Absolute: 0.1 10*3/uL (ref 0.0–0.1)
Basophils Relative: 1 %
Eosinophils Absolute: 0 10*3/uL (ref 0.0–0.5)
Eosinophils Relative: 0 %
HCT: 44.6 % (ref 39.0–52.0)
Hemoglobin: 15.1 g/dL (ref 13.0–17.0)
Immature Granulocytes: 0 %
Lymphocytes Relative: 24 %
Lymphs Abs: 2.4 10*3/uL (ref 0.7–4.0)
MCH: 32.5 pg (ref 26.0–34.0)
MCHC: 33.9 g/dL (ref 30.0–36.0)
MCV: 95.9 fL (ref 80.0–100.0)
Monocytes Absolute: 0.9 10*3/uL (ref 0.1–1.0)
Monocytes Relative: 9 %
Neutro Abs: 6.8 10*3/uL (ref 1.7–7.7)
Neutrophils Relative %: 66 %
Platelets: 345 10*3/uL (ref 150–400)
RBC: 4.65 MIL/uL (ref 4.22–5.81)
RDW: 13.3 % (ref 11.5–15.5)
WBC: 10.2 10*3/uL (ref 4.0–10.5)
nRBC: 0 % (ref 0.0–0.2)

## 2019-09-01 LAB — COMPREHENSIVE METABOLIC PANEL
ALT: 28 U/L (ref 0–44)
AST: 32 U/L (ref 15–41)
Albumin: 4.6 g/dL (ref 3.5–5.0)
Alkaline Phosphatase: 54 U/L (ref 38–126)
Anion gap: 9 (ref 5–15)
BUN: 21 mg/dL — ABNORMAL HIGH (ref 6–20)
CO2: 23 mmol/L (ref 22–32)
Calcium: 8.9 mg/dL (ref 8.9–10.3)
Chloride: 106 mmol/L (ref 98–111)
Creatinine, Ser: 0.84 mg/dL (ref 0.61–1.24)
GFR calc Af Amer: >60 mL/min (ref >60)
GFR calc non Af Amer: >60 mL/min (ref >60)
Glucose, Bld: 126 mg/dL — ABNORMAL HIGH (ref 70–99)
Potassium: 3.6 mmol/L (ref 3.5–5.1)
Sodium: 138 mmol/L (ref 135–145)
Total Bilirubin: 0.8 mg/dL (ref 0.3–1.2)
Total Protein: 7.8 g/dL (ref 6.5–8.1)

## 2019-09-01 LAB — VALPROIC ACID LEVEL: Valproic Acid Lvl: <10 ug/mL — ABNORMAL LOW (ref 50.0–100.0)

## 2019-09-01 LAB — ETHANOL: Alcohol, Ethyl (B): <10 mg/dL (ref <10)

## 2019-09-01 LAB — SARS CORONAVIRUS 2 BY RT PCR (HOSPITAL ORDER, PERFORMED IN ~~LOC~~ HOSPITAL LAB): SARS Coronavirus 2: NEGATIVE

## 2019-09-01 LAB — RAPID URINE DRUG SCREEN, HOSP PERFORMED
Amphetamines: NOT DETECTED
Barbiturates: NOT DETECTED
Benzodiazepines: NOT DETECTED
Cocaine: NOT DETECTED
Opiates: NOT DETECTED
Tetrahydrocannabinol: NOT DETECTED

## 2019-09-01 MED ORDER — PANTOPRAZOLE SODIUM 40 MG PO TBEC
40.0000 mg | DELAYED_RELEASE_TABLET | Freq: Every day | ORAL | Status: DC
  Administered 2019-09-02 – 2019-09-09 (×8): 40 mg via ORAL
  Filled 2019-09-01 (×3): qty 1
  Filled 2019-09-01: qty 2
  Filled 2019-09-01 (×6): qty 1

## 2019-09-01 MED ORDER — DIVALPROEX SODIUM 500 MG PO DR TAB
1000.0000 mg | DELAYED_RELEASE_TABLET | Freq: Every day | ORAL | Status: DC
  Administered 2019-09-02 – 2019-09-04 (×3): 1000 mg via ORAL
  Filled 2019-09-01 (×6): qty 2

## 2019-09-01 MED ORDER — PANTOPRAZOLE SODIUM 40 MG PO TBEC: 40.0000 mg | DELAYED_RELEASE_TABLET | Freq: Every day | ORAL | Status: DC

## 2019-09-01 MED ORDER — DIVALPROEX SODIUM 500 MG PO DR TAB
1000.0000 mg | DELAYED_RELEASE_TABLET | Freq: Every day | ORAL | Status: DC
  Administered 2019-09-01: 1000 mg via ORAL
  Filled 2019-09-01: qty 2

## 2019-09-01 MED ORDER — HYDROXYZINE HCL 50 MG PO TABS
50.0000 mg | ORAL_TABLET | Freq: Once | ORAL | Status: AC
  Administered 2019-09-01: 50 mg via ORAL
  Filled 2019-09-01 (×2): qty 1

## 2019-09-01 MED ORDER — TRAZODONE HCL 100 MG PO TABS
100.0000 mg | ORAL_TABLET | Freq: Once | ORAL | Status: AC
  Administered 2019-09-01: 100 mg via ORAL
  Filled 2019-09-01 (×2): qty 1

## 2019-09-01 NOTE — ED Notes (Signed)
Pt arrived to unit calm and cooperative.  All belongings were locked in locker 32.

## 2019-09-01 NOTE — ED Notes (Signed)
Attempt once to collect labs unable to collect enough for test

## 2019-09-01 NOTE — ED Triage Notes (Signed)
Pt asked what brings him to the hospital today and he replies, "Doesn't it say what I already told the lady?" This RN informed the patient that I am the triage nurse and need to get information about why he is here today and what he is wanting [place] to help him with". Pt responds "well, I already told one lady and I just want to talk to the doctor, so I am not going to to talk to anyone else besides the doctor about it".  Patient denies SI.

## 2019-09-01 NOTE — ED Notes (Signed)
Pt is to be transported after shift change per [facility]

## 2019-09-01 NOTE — ED Notes (Signed)
Tried to call report x 2 with no answer.   Will continue to attempt to call.

## 2019-09-01 NOTE — ED Provider Notes (Signed)
[facility]-EMERGENCY DEPT [facility] Emergency Department Provider Note MRN:  [id number]  Arrival date & time: 09/01/19     Chief Complaint   Psychiatric evaluation History of Present Illness   Albert [name] is a 43 y.o. year-old male with a history of paranoid schizophrenia presenting to the ED with chief complaint of psychiatric evaluation.  Patient was discharged from a psychiatric facility only few days ago.  He is having trouble fully articulating why he is here.  Trouble eating, trouble sleeping.  He speaks tangentially about various unrelated things and events.  He denies suicidal or homicidal ideation, no AVH.  Has not been taking his medications.  He endorses a episode of palpitations 2 days ago.  I was unable to obtain an accurate HPI, PMH, or ROS due to the patient's schizophrenia.  Level 5 caveat.  Review of Systems  Positive for schizophrenia  Patient's Health History    Past Medical History:  Diagnosis Date   Alcoholism (HCC)    Drug abuse (HCC)    xanax addiction 5 years ago   Medical non-compliance    Paranoid schizophrenia (HCC)    Schizophrenia (HCC)    Seizures (HCC)     History reviewed. No pertinent surgical history.  Family History  Problem Relation Age of Onset   Thyroid disease Mother    Depression Maternal Aunt     Social History   Socioeconomic History   Marital status: Single    Spouse name: Not on file   Number of children: Not on file   Years of education: Not on file   Highest education level: Not on file  Occupational History    Comment: Unclear  Social Needs   Financial resource strain: Not on file   Food insecurity    Worry: Not on file    Inability: Not on file   Transportation needs    Medical: Not on file    Non-medical: Not on file  Tobacco Use   Smoking status: Never Smoker   Smokeless tobacco: Never Used   Tobacco comment: Non-smoker  Substance and Sexual Activity   Alcohol use: Not Currently     Drug use: Not Currently    Types: Marijuana    Comment: xanax- several years ago   Sexual activity: Not Currently  Lifestyle   Physical activity    Days per week: Not on file    Minutes per session: Not on file   Stress: Not on file  Relationships   Social connections    Talks on phone: Not on file    Gets together: Not on file    Attends religious service: Not on file    Active member of club or organization: Not on file    Attends meetings of clubs or organizations: Not on file    Relationship status: Not on file   Intimate partner violence    Fear of current or ex partner: Not on file    Emotionally abused: Not on file    Physically abused: Not on file    Forced sexual activity: Not on file  Other Topics Concern   Not on file  Social History Narrative   ** Merged History Encounter **    Pt reported that he lives in a house apartment; he is followed by Dr. Betti Cruz.  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     Physical Exam  Vital Signs and Nursing Notes reviewed Vitals:   09/01/19 1011 09/01/19  1015  BP: (!) 140/94 (!) 120/93  Pulse: 80 81  Resp: 17 16  SpO2: 99% 97%    CONSTITUTIONAL: Well-appearing, NAD NEURO:  Alert and oriented x 3, no focal deficits EYES:  eyes equal and reactive ENT/NECK:  no LAD, no JVD CARDIO: Regular rate, well-perfused, normal S1 and S2 PULM:  CTAB no wheezing or rhonchi GI/GU:  normal bowel sounds, non-distended, non-tender MSK/SPINE:  No gross deformities, no edema SKIN:  no rash, atraumatic PSYCH: Bizarre and tangential speech and behavior  Diagnostic and Interventional Summary    EKG Interpretation  Date/Time:  Tuesday September 01 2019 10:44:26 EST Ventricular Rate:  65 PR Interval:    QRS Duration: 91 QT Interval:  412 QTC Calculation: 429 R Axis:   37 Text Interpretation: Sinus rhythm Confirmed by Gerlene Fee [phone]) on 09/01/2019 11:17:34 AM      Labs  Reviewed  COMPREHENSIVE METABOLIC PANEL - Abnormal; Notable for the following components:      Result Value   Glucose, Bld 126 (*)    BUN 21 (*)    All other components within normal limits  VALPROIC ACID LEVEL - Abnormal; Notable for the following components:   Valproic Acid Lvl <10 (*)    All other components within normal limits  ETHANOL  RAPID URINE DRUG SCREEN, HOSP PERFORMED  CBC WITH DIFFERENTIAL/PLATELET    No orders to display    Medications  divalproex (DEPAKOTE) DR tablet 1,000 mg (has no administration in time range)  pantoprazole (PROTONIX) EC tablet 40 mg (has no administration in time range)     Procedures  /  Critical Care Procedures  ED Course and Medical Decision Making  I have reviewed the triage vital signs and the nursing notes.  Pertinent labs & imaging results that were available during my care of the patient were reviewed by me and considered in my medical decision making (see below for details).  Suspect psychiatric etiology of patient's current state.  He is without SI, no HI, no AVH, not an immediate danger to self or others, but would likely benefit from TTS consultation and possibly inpatient management.  Will check Depakote level.  Labs are overall reassuring and patient is medically cleared.  Depakote level is low, supporting the notion that noncompliance with medications has led to a psychiatric disturbance.  TTS recommending inpatient management.  Home meds ordered.  Signed out to default provider.  Barth Kirks. Sedonia Small, [place] [email or web address]  Final Clinical Impressions(s) / ED Diagnoses     ICD-10-CM   1. Psychiatric disturbance  F99     ED Discharge Orders    None      Discharge Instructions Discussed with and Provided to Patient: Discharge Instructions   None       Maudie Flakes, MD 09/01/19 1330

## 2019-09-01 NOTE — ED Notes (Signed)
Pt c/o of not being able to sleep or eat for a few days since he was D/C from [facility].  Pt states he just wants to feel like himself again.

## 2019-09-01 NOTE — ED Notes (Signed)
Pt wanded °

## 2019-09-01 NOTE — ED Notes (Signed)
Report called to [facility]. He has been accepted per [employer] PA to room 504-1. He has been calm and cooperative. Continues to be vague and ask same questions writer has already answered,ie am I going to the hospital tonight? Where am I going? Flat affect. Soft spoken. Has rested but not slept this shift. All property returned to him and transported to [facility] per [employer].
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
## 2019-09-01 NOTE — ED Notes (Signed)
Belongings placed in pt locker in [facility] for room 32

## 2019-09-01 NOTE — BH Assessment (Signed)
[place] Assessment Progress Note  Per Shuvon Rankin, FNP, this pt requires psychiatric hospitalization at this time.  Heather, RN has assigned pt to [facility] Rm 504-1 pending negative Covid-19 test results.  Pt has signed Voluntary Admission and Consent for Treatment, as well as Consent to Release Information to Terrilyn Saver, MD, and to two friends, and a notification call has been placed to the provider.  Signed forms have been faxed to [facility].  Pt's nurse, Nena Jordan, has been notified, and agrees to send original paperwork along with pt via [employer], and to call report to [phone].  Jalene Mullet, [place] Coordinator [phone]

## 2019-09-01 NOTE — ED Notes (Signed)
TTS consult in room  

## 2019-09-01 NOTE — Discharge Summary (Addendum)
  Patient to be transferred to [facility] for inpatient psychiatric treatment once negative Covid results are in.    Attest to NP note

## 2019-09-01 NOTE — ED Notes (Signed)
Pt dressed out and belongings placed in cabinet for 23-25 hall C section

## 2019-09-02 MED ORDER — IBUPROFEN 600 MG PO TABS
600.0000 mg | ORAL_TABLET | Freq: Four times a day (QID) | ORAL | Status: DC | PRN
  Administered 2019-09-04 – 2019-09-09 (×7): 600 mg via ORAL
  Filled 2019-09-02 (×7): qty 1

## 2019-09-02 MED ORDER — HYDROXYZINE HCL 50 MG PO TABS
50.0000 mg | ORAL_TABLET | Freq: Once | ORAL | Status: AC
  Administered 2019-09-02: 50 mg via ORAL
  Filled 2019-09-02 (×2): qty 1

## 2019-09-02 MED ORDER — TRAZODONE HCL 100 MG PO TABS
100.0000 mg | ORAL_TABLET | Freq: Once | ORAL | Status: AC
  Administered 2019-09-02: 100 mg via ORAL
  Filled 2019-09-02 (×2): qty 1

## 2019-09-02 NOTE — BHH Group Notes (Signed)
[place] LCSW Group Therapy Note  Date/Time: 1:30 pm 09/02/2019  Type of Therapy/Topic:  Group Therapy:  Feelings about Diagnosis  Participation Level:  None   Mood: Pleasant    Description of Group:    This group will allow patients to explore their thoughts and feelings about diagnoses they have received. Patients will be guided to explore their level of understanding and acceptance of these diagnoses. Facilitator will encourage patients to process their thoughts and feelings about the reactions of others to their diagnosis, and will guide patients in identifying ways to discuss their diagnosis with significant others in their lives. This group will be process-oriented, with patients participating in exploration of their own experiences as well as giving and receiving support and challenge from other group members.   Therapeutic Goals: 1. Patient will demonstrate understanding of diagnosis as evidence by identifying two or more symptoms of the disorder:  2. Patient will be able to express two feelings regarding the diagnosis 3. Patient will demonstrate ability to communicate their needs through discussion and/or role plays  Summary of Patient Progress:  Pt was present in group, but did not participate. Pt seemed engaged in listening.      Therapeutic Modalities:   Cognitive Behavioral Therapy Brief Therapy Feelings Identification   Ovidio Kin, MSW intern

## 2019-09-02 NOTE — Tx Team (Signed)
Initial Treatment Plan 09/02/2019 1:17 AM Albert [name] IPJ:[id number]    PATIENT STRESSORS: Marital or family conflict Medication change or noncompliance   PATIENT STRENGTHS: General fund of knowledge Motivation for treatment/growth   PATIENT IDENTIFIED PROBLEMS: psychosis  " continue with recovery program"                   DISCHARGE CRITERIA:  Adequate post-discharge living arrangements Verbal commitment to aftercare and medication compliance  PRELIMINARY DISCHARGE PLAN: Attend aftercare/continuing care group Attend PHP/IOP Outpatient therapy  PATIENT/FAMILY INVOLVEMENT: This treatment plan has been presented to and reviewed with the patient, [name].  The patient and family have been given the opportunity to ask questions and make suggestions.  Providence Crosby, RN 09/02/2019, 1:17 AM

## 2019-09-02 NOTE — Progress Notes (Signed)
Recreation Therapy Notes  Patient admitted to unit 11.3.20. Due to admission within last year, no new assessment conducted at this time. Last assessment conducted 3.3.20. Patient reports reason for admission as not sleeping, eating or drinking.  Patient reports stressor as "loud, weird roommates".  Patient maintains journal, sports, tv, music, exercise, deep breathing, meditate, talk, prayer, art and reading are his coping skills.  Patient identified community resources as [employer], [employer] and [place].  Patient identified strengths as being a Pharmacist, hospital at [employer] and likes to be in the company of his friends.  Patient identified areas of improvement as "letting others know what's going on with me" and re construe things and being careful listening.  Patient denies SI, HI, AVH at this time. Patient reports goal of "find out about discharge"    Victorino Sparrow, LRT/CTRS   Victorino Sparrow A 09/02/2019 1:03 PM

## 2019-09-02 NOTE — Progress Notes (Signed)
Patient ID: Albert [name], male   DOB: 07-Jun-1976, 43 y.o.   MRN: [id number]  CSW met with patient and patient gave information regarding his medications.  Patient stated that he sees Dr. Fay Records and that he cannot take respardal due to bad effects. Patient stated that he only can take pills because he does not do well with injects and states that Dr. Fay Records agrees) Patient stated that when he was at the primary hospital that they tried to give him Seraquel but he cannot take it and he was having really bad breathing. Patient stated that trazadone works much better for him.

## 2019-09-02 NOTE — Progress Notes (Signed)
Pt was in the dayroom watching tv, until 2200. Pt came to the NS and asked nurse about their medications. Pt reminded pt of the medications that they have ordered. Pt began to get agitated, and argumentative with nurse. Pt began to curse at nurse until Probation officer and other techs reiterated what nurse explained to pt. Pt was still argumentative until security was called to assist with walking pt to their room, as to not disturb other patients. Pt did not go to their room, instead they took scheduled medications and went to their room. Pt is not sitting up in bed, reading with the lights on (@2242 ).
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
## 2019-09-02 NOTE — [facility] Group Notes (Signed)
Pt did not attend wrap up group this evening. Pt was in their room.  

## 2019-09-02 NOTE — Progress Notes (Signed)
Admission Note:  43 yr male who presents [facility] in no acute distress for the treatment of psychosis and bizarre behavior. Pt appears flat and depressed. Pt was calm and cooperative with admission process. Pt presented bizarre , paranoid and delusional at times. Pt had flight of ideas with disorganized thought process. Pt very paranoid during admission. Pt poor historian at times.   Skin was assessed and found to be clear of any abnormal marks . PT searched and no contraband found, POC and unit policies explained and understanding verbalized. Consents obtained. Food and fluids offered, and fluids accepted.  R: Pt had no additional questions or concerns.
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
## 2019-09-02 NOTE — Tx Team (Signed)
Interdisciplinary Treatment and Diagnostic Plan Update  09/02/2019 Time of Session: 10:15am Albert [name] MRN: [id number]  Principal Diagnosis: <principal problem not specified>  Secondary Diagnoses: Active Problems:   Schizophrenia (HCC)   Current Medications:  Current Facility-Administered Medications  Medication Dose Route Frequency Provider Last Rate Last Dose  . divalproex (DEPAKOTE) DR tablet 1,000 mg  1,000 mg Oral QHS Rankin, Shuvon B, NP      . ibuprofen (ADVIL) tablet 600 mg  600 mg Oral Q6H PRN Malvin Johns, MD      . pantoprazole (PROTONIX) EC tablet 40 mg  40 mg Oral Daily Rankin, Shuvon B, NP   40 mg at 09/02/19 0815   PTA Medications: Medications Prior to Admission  Medication Sig Dispense Refill Last Dose  . divalproex (DEPAKOTE) 500 MG DR tablet Take 1,000 mg by mouth at bedtime. Mornings; sleeps during the day     . omeprazole (PRILOSEC) 40 MG capsule Take 40 mg by mouth daily as needed (heart burn).       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   Physician Treatment Plan for Primary Diagnosis: <principal problem not specified> Long Term Goal(s): Improvement in symptoms so as ready for discharge Improvement in symptoms so as ready for discharge   Short Term Goals: Ability to verbalize feelings will improve Ability to disclose and discuss suicidal ideas Ability to demonstrate self-control will improve Ability to identify and develop effective coping behaviors will improve Ability to maintain clinical measurements within normal limits will improve Ability to disclose and discuss suicidal ideas Ability to demonstrate self-control will improve Ability to identify and develop effective  coping behaviors will improve Ability to maintain clinical measurements within normal limits will improve  Medication Management: Evaluate patient's response, side effects, and tolerance of medication regimen.  Therapeutic Interventions: 1 to 1 sessions, Unit Group sessions and Medication administration.  Evaluation of Outcomes: Progressing  Physician Treatment Plan for Secondary Diagnosis: Active Problems:   Schizophrenia (HCC)  Long Term Goal(s): Improvement in symptoms so as ready for discharge Improvement in symptoms so as ready for discharge   Short Term Goals: Ability to verbalize feelings will improve Ability to disclose and discuss suicidal ideas Ability to demonstrate self-control will improve Ability to identify and develop effective coping behaviors will improve Ability to maintain clinical measurements within normal limits will improve Ability to disclose and discuss suicidal ideas Ability to demonstrate self-control will improve Ability to identify and develop effective coping behaviors will improve Ability to maintain clinical measurements within normal limits will improve     Medication Management: Evaluate patient's response, side effects, and tolerance of medication regimen.  Therapeutic Interventions: 1 to 1 sessions, Unit Group sessions and Medication administration.  Evaluation of Outcomes: Progressing   RN Treatment Plan for Primary Diagnosis: <principal problem not specified> Long Term Goal(s): Knowledge of disease and therapeutic regimen to maintain health will improve  Short Term Goals: Ability to participate in decision making will improve, Ability to verbalize feelings will improve, Ability to disclose and discuss suicidal ideas, Ability to identify and develop effective coping behaviors will improve and Compliance with prescribed medications will improve  Medication Management: RN will administer medications as ordered by provider, will assess and  evaluate patient's response and provide education to patient for prescribed medication. RN will report any adverse and/or side  effects to prescribing provider.  Therapeutic Interventions: 1 on 1 counseling sessions, Psychoeducation, Medication administration, Evaluate responses to treatment, Monitor vital signs and CBGs as ordered, Perform/monitor CIWA, COWS, AIMS and Fall Risk screenings as ordered, Perform wound care treatments as ordered.  Evaluation of Outcomes: Progressing   LCSW Treatment Plan for Primary Diagnosis: <principal problem not specified> Long Term Goal(s): Safe transition to appropriate next level of care at discharge, Engage patient in therapeutic group addressing interpersonal concerns.  Short Term Goals: Engage patient in aftercare planning with referrals and resources and Increase skills for wellness and recovery  Therapeutic Interventions: Assess for all discharge needs, 1 to 1 time with Social worker, Explore available resources and support systems, Assess for adequacy in community support network, Educate family and significant other(s) on suicide prevention, Complete Psychosocial Assessment, Interpersonal group therapy.  Evaluation of Outcomes: Progressing   Progress in Treatment: Attending groups: No. Participating in groups: No. Taking medication as prescribed: Yes. Toleration medication: Yes. Family/Significant other contact made: No, will contact:  will contact if given consent to contact Patient understands diagnosis: No. Discussing patient identified problems/goals with staff: Yes. Medical problems stabilized or resolved: Yes. Denies suicidal/homicidal ideation: Yes. Issues/concerns per patient self-inventory: No. Other: Patient did discuss that he does not do well with respirdal and that he does better with pills than he does with injections.   New problem(s) identified: No, Describe:  None  New Short Term/Long Term Goal(s): Medication stabilization,  elimination of SI thoughts, and development of a comprehensive mental wellness plan.   Patient Goals:  "Get my medications right'  Discharge Plan or Barriers: CSW will continue to follow up for appropriate referrals and possible discharge planning  Reason for Continuation of Hospitalization: Aggression Delusions  Depression Medication stabilization  Estimated Length of Stay: 3-5 days   Attendees: Patient: Albert [name] 09/02/2019   Physician: Dr. Johnn Hai, MD 09/02/2019  Nursing: Vladimir Faster, RN 09/02/2019   RN Care Manager: 09/02/2019  Social Worker: Ardelle Anton, LCSW  09/02/2019   Recreational Therapist:  09/02/2019  Other: Ovidio Kin, MSW Intern 09/02/2019   Other:  09/02/2019  Other: 09/02/2019     Scribe for Treatment Team: Trecia Rogers, LCSW 09/02/2019 11:21 AM

## 2019-09-02 NOTE — Progress Notes (Signed)
Recreation Therapy Notes  Date: 11.4.20 Time: 1000 Location: 500 Hall Dayroom   Group Topic: Communication, Team Building, Problem Solving  Goal Area(s) Addresses:  Patient will effectively work with peer towards shared goal.  Patient will identify skill used to make activity successful.  Patient will identify how skills used during activity can be used to reach post d/c goals.   Intervention: STEM Activity   Activity: [employer]. Patients were provided the following materials: 5 drinking straws, 5 rubber bands, 5 paper clips, 2 index cards and 2 drinking cups. Using the provided materials patients were asked to build a launching mechanisms to launch a ping pong ball approximately 12 feet. Patients were divided into teams of 3-5.   Education: Education officer, community, Dentist.   Education Outcome: Acknowledges education/In group clarification offered/Needs additional education.   Clinical Observations/Feedback:  Pt did not attend group.    Victorino Sparrow, LRT/CTRS    Ria Comment, Cayci Mcnabb A 09/02/2019 1:00 PM

## 2019-09-03 DIAGNOSIS — F200 Paranoid schizophrenia: Principal | ICD-10-CM

## 2019-09-03 MED ORDER — BENZTROPINE MESYLATE 1 MG PO TABS
1.0000 mg | ORAL_TABLET | Freq: Two times a day (BID) | ORAL | Status: DC
  Administered 2019-09-03 – 2019-09-09 (×12): 1 mg via ORAL
  Filled 2019-09-03 (×15): qty 1

## 2019-09-03 MED ORDER — FLUPHENAZINE HCL 10 MG PO TABS
10.0000 mg | ORAL_TABLET | Freq: Two times a day (BID) | ORAL | Status: DC
  Administered 2019-09-03 – 2019-09-04 (×2): 10 mg via ORAL
  Filled 2019-09-03 (×4): qty 1

## 2019-09-03 NOTE — Progress Notes (Signed)
D:  Patient's self inventory sheet, patient sleeps okay, sleep medication helpful Trazadone.  Good appetite, Okay energy level, okay concentration.  Denied withdrawals.  Denied SI.  Denied physical problems.  Denied physical pain.  Plans to make sure I get on right medication with MD.  Make sure I see MD.  No discharge plans.  Plans to discharge home. A:  Medications administered per MD orders.  Emotional support and encouragement given patient. R:  Denied SI and HI, contracts for safety.  Denied A/V hallucinations. Safety maintained with 15 minute check.

## 2019-09-03 NOTE — Progress Notes (Signed)
Recreation Therapy Notes  Date: 11.5.20 Time: 0950 Location: 500 Hall Dayroom  Group Topic: Communication  Goal Area(s) Addresses:  Patient will effectively communicate with peers in group.  Patient will verbalize benefit of healthy communication. Patient will verbalize positive effect of healthy communication on post d/c goals.  Patient will identify communication techniques that made activity effective for group.   Behavioral Response: Minimal  Intervention: Geometric Drawings, pencils, blank paper  Activity: Geometrical Drawings.  Three patients would get the chance to describe a picture to the remainder of the group.  These three individuals could describe their drawings as detailed as possible.  The remainder of the group had to draw the pictures as described to them.  The remainder of the group could not ask any specific questions.  They could only as the presenter to repeat themselves.  Education: Communication, Discharge Planning  Education Outcome: Acknowledges understanding/In group clarification offered/Needs additional education.   Clinical Observations/Feedback:  Pt participated in the activity but did not engage in processing.  Pt was quiet and appropriate during activity.   Victorino Sparrow, LRT/CTRS     Victorino Sparrow A 09/03/2019 11:31 AM

## 2019-09-03 NOTE — Progress Notes (Signed)
   09/03/19 2000  Psych Admission Type (Psych Patients Only)  Admission Status Voluntary  Psychosocial Assessment  Patient Complaints Anxiety;Irritability;Sadness  Eye Contact Fair  Facial Expression Sad  Affect Labile;Sad;Fearful;Anxious  Speech Tangential;Pressured  Interaction Assertive  Motor Activity Slow  Appearance/Hygiene Disheveled  Behavior Characteristics Anxious  Mood Depressed  Aggressive Behavior  Effect No apparent injury  Thought Process  Coherency Blocking;Disorganized;Flight of ideas;Loose associations  Content Preoccupation  Delusions None reported or observed  Perception WDL  Hallucination None reported or observed  Judgment Impaired  Confusion None  Danger to Self  Current suicidal ideation? Denies  Danger to Others  Danger to Others None reported or observed   Pt minimal interaction in the dayroom this evening

## 2019-09-03 NOTE — Progress Notes (Signed)
[facility] MD Progress Note  09/03/2019 11:28 AM Albert [name]  MRN:  [id number] Subjective:   Patient continues to insist that he is hearing the individuals living upstairs at his apartment telling him they are going to kill him and this is driven his paranoia, the delusion is less defined than yesterday but at any rate he still has it he denies current hallucinations while here  He continues to argue that he is only needs "low-dose Seroquel" and does not want to take the medication as prescribed.  I told him that we have consulted extensively with Dr. Reece Levy and he finally does agree to concur and take the medication as Dr. Reece Levy has recommended.  No EPS or TD.  Lacking insight Principal Problem: Exacerbation of psychotic disorder Diagnosis: Active Problems:   Schizophrenia ([place])  Total Time spent with patient: 20 minutes  Past Psychiatric History: Multiple past admissions complicated by his poor compliance and continually demanding certain medications that are less effective  Past Medical History:  Past Medical History:  Diagnosis Date  . Alcoholism ([place])   . Drug abuse ([place])    xanax addiction 5 years ago  . Medical non-compliance   . Paranoid schizophrenia ([place])   . Schizophrenia ([place])   . Seizures ([place])    History reviewed. No pertinent surgical history. Family History:  Family History  Problem Relation Age of Onset  . Thyroid disease Mother   . Depression Maternal Aunt    Family Psychiatric  History: No new data Social History:  Social History   Substance and Sexual Activity  Alcohol Use Not Currently     Social History   Substance and Sexual Activity  Drug Use Not Currently  . Types: Marijuana   Comment: xanax- several years ago    Social History   Socioeconomic History  . Marital status: Single    Spouse name: Not on file  . Number of children: Not on file  . Years of education: Not on file  . Highest education level: Not on file  Occupational History     Comment: Unclear  Social Needs  . Financial resource strain: Not on file  . Food insecurity    Worry: Not on file    Inability: Not on file  . Transportation needs    Medical: Not on file    Non-medical: Not on file  Tobacco Use  . Smoking status: Never Smoker  . Smokeless tobacco: Never Used  . Tobacco comment: Non-smoker  Substance and Sexual Activity  . Alcohol use: Not Currently  . Drug use: Not Currently    Types: Marijuana    Comment: xanax- several years ago  . Sexual activity: Not Currently  Lifestyle  . Physical activity    Days per week: Not on file    Minutes per session: Not on file  . Stress: Not on file  Relationships  . Social Herbalist on phone: Not on file    Gets together: Not on file    Attends religious service: Not on file    Active member of club or organization: Not on file    Attends meetings of clubs or organizations: Not on file    Relationship status: Not on file  Other Topics Concern  . Not on file  Social History Narrative   ** Merged History Encounter **    Pt reported that he lives in a house apartment; he is followed by Dr. Reece Levy.  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   Additional Social History:                         Sleep: Fair  Appetite:  Fair  Current Medications: Current Facility-Administered Medications  Medication Dose Route Frequency Provider Last Rate Last Dose  . divalproex (DEPAKOTE) DR tablet 1,000 mg  1,000 mg Oral QHS Rankin, Shuvon B, NP   1,000 mg at 09/02/19 2046  . ibuprofen (ADVIL) tablet 600 mg  600 mg Oral Q6H PRN Malvin Johns, MD      . pantoprazole (PROTONIX) EC tablet 40 mg  40 mg Oral Daily Rankin, Shuvon B, NP   40 mg at 09/03/19 1191    Lab Results:  Results for orders placed or performed during the hospital encounter of 09/01/19 (from the past 48 hour(s))  Comprehensive metabolic panel     Status: Abnormal   Collection Time:  09/01/19 11:45 AM  Result Value Ref Range   Sodium 138 135 - 145 mmol/L   Potassium 3.6 3.5 - 5.1 mmol/L   Chloride 106 98 - 111 mmol/L   CO2 23 22 - 32 mmol/L   Glucose, Bld 126 (H) 70 - 99 mg/dL   BUN 21 (H) 6 - 20 mg/dL   Creatinine, Ser 4.78 0.61 - 1.24 mg/dL   Calcium 8.9 8.9 - 29.5 mg/dL   Total Protein 7.8 6.5 - 8.1 g/dL   Albumin 4.6 3.5 - 5.0 g/dL   AST 32 15 - 41 U/L   ALT 28 0 - 44 U/L   Alkaline Phosphatase 54 38 - 126 U/L   Total Bilirubin 0.8 0.3 - 1.2 mg/dL   GFR calc non Af Amer >60 >60 mL/min   GFR calc Af Amer >60 >60 mL/min   Anion gap 9 5 - 15    Comment: Performed at [facility], [address] [place], [place] [address]  Ethanol     Status: None   Collection Time: 09/01/19 11:45 AM  Result Value Ref Range   Alcohol, Ethyl (B) <10 <10 mg/dL    Comment: (NOTE) Lowest detectable limit for serum alcohol is 10 mg/dL. For medical purposes only. Performed at [facility], [address] [place], [place] [address]   CBC with Diff     Status: None   Collection Time: 09/01/19 11:45 AM  Result Value Ref Range   WBC 10.2 4.0 - 10.5 K/uL   RBC 4.65 4.22 - 5.81 MIL/uL   Hemoglobin 15.1 13.0 - 17.0 g/dL   HCT 46.9 62.9 - 52.8 %   MCV 95.9 80.0 - 100.0 fL   MCH 32.5 26.0 - 34.0 pg   MCHC 33.9 30.0 - 36.0 g/dL   RDW 41.3 24.4 - 01.0 %   Platelets 345 150 - 400 K/uL   nRBC 0.0 0.0 - 0.2 %   Neutrophils Relative % 66 %   Neutro Abs 6.8 1.7 - 7.7 K/uL   Lymphocytes Relative 24 %   Lymphs Abs 2.4 0.7 - 4.0 K/uL   Monocytes Relative 9 %   Monocytes Absolute 0.9 0.1 - 1.0 K/uL   Eosinophils Relative 0 %   Eosinophils Absolute 0.0 0.0 - 0.5 K/uL   Basophils Relative 1 %   Basophils Absolute 0.1 0.0 - 0.1 K/uL   Immature Granulocytes 0 %   Abs Immature Granulocytes 0.03 0.00 - 0.07 K/uL    Comment: Performed at [facility], [address] [place], [place] [address]  Valproic acid level     Status:  Abnormal   Collection Time: 09/01/19 11:45 AM  Result Value Ref Range   Valproic Acid Lvl <10 (L) 50.0 - 100.0 ug/mL    Comment: RESULTS CONFIRMED BY MANUAL DILUTION Performed at [facility], [address] [place], [place] [address]   Urine rapid drug screen (hosp performed)     Status: None   Collection Time: 09/01/19 12:52 PM  Result Value Ref Range   Opiates NONE DETECTED NONE DETECTED   Cocaine NONE DETECTED NONE DETECTED   Benzodiazepines NONE DETECTED NONE DETECTED   Amphetamines NONE DETECTED NONE DETECTED   Tetrahydrocannabinol NONE DETECTED NONE DETECTED   Barbiturates NONE DETECTED NONE DETECTED    Comment: (NOTE) DRUG SCREEN FOR MEDICAL PURPOSES ONLY.  IF CONFIRMATION IS NEEDED FOR ANY PURPOSE, NOTIFY LAB WITHIN 5 DAYS. LOWEST DETECTABLE LIMITS FOR URINE DRUG SCREEN Drug Class                     Cutoff (ng/mL) Amphetamine and metabolites    1000 Barbiturate and metabolites    200 Benzodiazepine                 200 Tricyclics and metabolites     300 Opiates and metabolites        300 Cocaine and metabolites        300 THC                            50 Performed at [facility], [address], [place], [place] [address]   SARS Coronavirus 2 by RT PCR (hospital order, performed in [facility] hospital lab) Nasopharyngeal Nasopharyngeal Swab     Status: None   Collection Time: 09/01/19  3:46 PM   Specimen: Nasopharyngeal Swab  Result Value Ref Range   SARS Coronavirus 2 NEGATIVE NEGATIVE    Comment: (NOTE) If result is NEGATIVE SARS-CoV-2 target nucleic acids are NOT DETECTED. The SARS-CoV-2 RNA is generally detectable in upper and lower  respiratory specimens during the acute phase of infection. The lowest  concentration of SARS-CoV-2 viral copies this assay can detect is 250  copies / mL. A negative result does not preclude SARS-CoV-2 infection  and should not be used as the sole basis for treatment or other  patient  management decisions.  A negative result may occur with  improper specimen collection / handling, submission of specimen other  than nasopharyngeal swab, presence of viral mutation(s) within the  areas targeted by this assay, and inadequate number of viral copies  (<250 copies / mL). A negative result must be combined with clinical  observations, patient history, and epidemiological information. If result is POSITIVE SARS-CoV-2 target nucleic acids are DETECTED. The SARS-CoV-2 RNA is generally detectable in upper and lower  respiratory specimens dur ing the acute phase of infection.  Positive  results are indicative of active infection with SARS-CoV-2.  Clinical  correlation with patient history and other diagnostic information is  necessary to determine patient infection status.  Positive results do  not rule out bacterial infection or co-infection with other viruses. If result is PRESUMPTIVE POSTIVE SARS-CoV-2 nucleic acids MAY BE PRESENT.   A presumptive positive result was obtained on the submitted specimen  and confirmed on repeat testing.  While 2019 novel coronavirus  (SARS-CoV-2) nucleic acids may be present in the submitted sample  additional  confirmatory testing may be necessary for epidemiological  and / or clinical management purposes  to differentiate between  SARS-CoV-2 and other Sarbecovirus currently known to infect humans.  If clinically indicated additional testing with an alternate test  methodology [phone] is advised. The SARS-CoV-2 RNA is generally  detectable in upper and lower respiratory sp ecimens during the acute  phase of infection. The expected result is Negative. Fact Sheet for Patients:  BoilerBrush.com.cyhttps://www.fda.gov/media/136312/download Fact Sheet for Healthcare Providers: https://pope.com/https://www.fda.gov/media/136313/download This test is not yet approved or cleared by the [place] FDA and has been authorized for detection and/or diagnosis of SARS-CoV-2 by FDA under  an Emergency Use Authorization (EUA).  This EUA will remain in effect (meaning this test can be used) for the duration of the COVID-19 declaration under Section 564(b)(1) of the Act, 21 U.S.C. section 360bbb-3(b)(1), unless the authorization is terminated or revoked sooner. Performed at [facility], [address] [place], [place] [address]     Blood Alcohol level:  Lab Results  Component Value Date   [facility] 09/01/2019   ETH <10 08/16/2019    Metabolic Disorder Labs: Lab Results  Component Value Date   HGBA1C 5.1 01/05/2017   MPG 100 01/05/2017   Lab Results  Component Value Date   PROLACTIN 30.4 (H) 01/06/2017   PROLACTIN 13.1 01/05/2017   Lab Results  Component Value Date   CHOL 172 01/05/2017   TRIG 112 01/05/2017   HDL 41 01/05/2017   CHOLHDL 4.2 01/05/2017   VLDL 22 01/05/2017   LDLCALC 109 (H) 01/05/2017    Musculoskeletal: Strength & Muscle Tone: within normal limits Gait & Station: normal Patient leans: N/A  Psychiatric Specialty Exam: Physical Exam  ROS  Blood pressure 136/65, pulse 79, temperature 97.7 F (36.5 C), temperature source Oral, resp. rate 20, height 5\' 10"  (1.778 m), weight 103.4 kg, SpO2 98 %.Body mass index is 32.71 kg/m.  General Appearance: Casual  Eye Contact:  Good  Speech:  Clear and Coherent  Volume:  Decreased  Mood:  Dysphoric  Affect:  Blunt  Thought Process:  Linear and Descriptions of Associations: Tangential  Orientation:  Full (Time, Place, and Person)  Thought Content:  Illogical, Delusions and Hallucinations: Auditory  Suicidal Thoughts:  No  Homicidal Thoughts:  No  Memory:  Immediate;   Fair Recent;   Fair Remote;   Fair  Judgement:  Fair  Insight: Not reality based  Psychomotor Activity:  Decreased  Concentration:  Concentration: Fair and Attention Span: Fair  Recall:  [employer]Fair  Fund of Knowledge:  Fair  Language:  Fair  Akathisia:  Negative  Handed:  Right  AIMS (if indicated):      Assets:  Physical Health Resilience  ADL's:  Intact  Cognition:  WNL  Sleep:  Number of Hours: 5     Treatment Plan Summary: Daily contact with patient to assess and evaluate symptoms and progress in treatment and Medication management  Begin antipsychotic therapy begin reality based therapy no change in precautions continue Depakote check level in a steady state again continue to try and keep the patient reality based  Miakoda Mcmillion, MD 09/03/2019, 11:28 AM

## 2019-09-03 NOTE — Progress Notes (Signed)
Pt became very agitated and argumentative about his medication during med pass. Pt stated he needed to sleep and asked for Trazodone and vistaril, when offered pt stated he wanted Seroquel and Zoloft. Pt was explained that he should address that with the doctor in the morning, but instead pt kept arguing, security had to be called to escort pt to his room. Pt walked to the room but kept complaining about how staff are not doing their jobs but they are getting paid. Pt later came back and took the trazodone and vistaril and went to bed. Pt also observed talking loud in his sleep, will continue to monitor.

## 2019-09-03 NOTE — [facility] Counselor (Signed)
Adult Comprehensive Assessment  Patient ID: Albert [name], male   DOB: 28-Oct-1976, 43 y.o.   MRN: [id number]  Information Source: Information source: Patient  Current Stressors:  Patient states their primary concerns and needs for treatment are:: "My roommate. He is trying to kill me. I cannot sleep. Keep hearing him through the walls. I have not been sleeping or eating/drinking for 3 days. My chest keeps going up and down and I didn't want to die so I came to the hospital" Patient states their goals for this hospitilization and ongoing recovery are:: "Try to understand what is going on" Educational / Learning stressors: Pt denies stressors. Employment / Job issues: Patient is currently on disability and unemployed. Family Relationships: Patient reports that he has firm boundaries with his family members. Financial / Lack of resources (include bankruptcy): Patient reports he lives on a fixed income. Housing / Lack of housing: Patient is having issues with his roommate. Patient stated that he could be evicted if he doesn't pay his rent by the 10th. Physical health (include injuries & life threatening diseases): Pt denies stressors Social relationships: Patient struggles to get along with others. Patient has limited supports. Substance abuse: Patient denies substance use. Patient attends AA meetings daily. Per last admission, patient was banned from the same group that he was with for 2.5 years. Bereavement / Loss: Patient denies stressors.  Living/Environment/Situation:  Living Arrangements: Non-relatives/Friends(with roommates) Living conditions (as described by patient or guardian): Patient reports that he is not happy where he lives because of his roommate. Patient is interested in moving somewhere else in [place]. Who else lives in the home?: A roommate How long has patient lived in current situation?: Since 2007. What is atmosphere in current home: Comfortable, Dangerous  Family  History:  Marital status: Single Are you sexually active?: No Does patient have children?: No  Childhood History:  By whom was/is the patient raised?: Both parents Additional childhood history information: Pt reports that his mother mainly took care of his family Description of patient's relationship with caregiver when they were a child: "Good" Patient's description of current relationship with people who raised him/her: "Good" Does patient have siblings?: Yes Number of Siblings: 3(pt has a twin sister and a half brother/sister) Description of patient's current relationship with siblings: "Pretty good" Did patient suffer any verbal/emotional/physical/sexual abuse as a child?: No Did patient suffer from severe childhood neglect?: No Has patient ever been sexually abused/assaulted/raped as an adolescent or adult?: No Was the patient ever a victim of a crime or a disaster?: No Witnessed domestic violence?: No Has patient been effected by domestic violence as an adult?: No  Education:  Highest grade of school patient has completed: 12th grade Currently a student?: No Learning disability?: No  Employment/Work Situation:   Employment situation: On disability Why is patient on disability: Bipolar/Depression How long has patient been on disability: 5 years Patient's job has been impacted by current illness: No What is the longest time patient has a held a job?: 3 years Where was the patient employed at that time?: [employer] Did You Receive Any Psychiatric Treatment/Services While in the [employer]?: No Are There Guns or Other Weapons in Your Home?: No  Financial Resources:   Financial resources: Insurance claims handler Does patient have a Lawyer or guardian?: No  Alcohol/Substance Abuse:   What has been your use of drugs/alcohol within the last 12 months?: Patient denies any substance use. If attempted suicide, did drugs/alcohol play a role in this?: No Alcohol/Substance Abuse  Treatment Hx: Attends AA/NA If yes, describe treatment: Patient attends [place] meetings. Has alcohol/substance abuse ever caused legal problems?: No  Social Support System:   Patient's Community Support System: Good Describe Community Support System: Family Type of faith/religion: Catholic How does patient's faith help to cope with current illness?: N/A  Leisure/Recreation:   Leisure and Hobbies: Bike riding, Research officer, trade union, conversations with others  Strengths/Needs:   What is the patient's perception of their strengths?: Honesty, Museum/gallery curator. Learning to have good human relations Patient states they can use these personal strengths during their treatment to contribute to their recovery: N/A Patient states these barriers may affect/interfere with their treatment: N/A Patient states these barriers may affect their return to the community: Patient not knowing if he can go back home with his current roommate. Other important information patient would like considered in planning for their treatment: N/A  Discharge Plan:   Currently receiving community mental health services: Yes (From Whom)(Dr. Fay Records and [facility]) Patient states concerns and preferences for aftercare planning are: Patient stated that he wants to go back to Dr. Fay Records for his medications. Patient stated that his therapy, Duke Salvia, has been "paranoid" about COVID but he has been seeing him for 10 years and wants to continue. Patient states they will know when they are safe and ready for discharge when: "I don't know yet" Does patient have access to transportation?: Yes Does patient have financial barriers related to discharge medications?: No Will patient be returning to same living situation after discharge?: Yes(Patient stated if he talks to his landlord about his roommate then he will go back but still wants housing resources.)  Summary/Recommendations:   Summary and Recommendations (to be completed by the evaluator):  Patient is a 43 year old male who who presents voluntarily  to [facility]. Pt's thoughts are tangential & disorganized. He had difficulty articulating answers to many questions. Pt reports trouble eating, sleeping & not taking his medication. Patients diagnosis is: Schizophrenia ([place]). Recommendations for pt include: crisis stabilization, therapeutic milieu, medication management, attend and participate in grouop therapy, and development of a comprehensive mental wellness plan.  Trecia Rogers. 09/03/2019

## 2019-09-03 NOTE — Progress Notes (Signed)
Patient refused the 1700 medications, will discuss medications with MD tomorrow.

## 2019-09-04 DIAGNOSIS — F22 Delusional disorders: Secondary | ICD-10-CM

## 2019-09-04 DIAGNOSIS — R410 Disorientation, unspecified: Secondary | ICD-10-CM

## 2019-09-04 DIAGNOSIS — F29 Unspecified psychosis not due to a substance or known physiological condition: Secondary | ICD-10-CM

## 2019-09-04 MED ORDER — FLUPHENAZINE HCL 5 MG PO TABS
15.0000 mg | ORAL_TABLET | Freq: Every day | ORAL | Status: DC
  Administered 2019-09-04: 15 mg via ORAL
  Filled 2019-09-04 (×2): qty 3

## 2019-09-04 MED ORDER — FLUPHENAZINE HCL 10 MG PO TABS
10.0000 mg | ORAL_TABLET | Freq: Every day | ORAL | Status: DC
  Administered 2019-09-05 – 2019-09-07 (×3): 10 mg via ORAL
  Filled 2019-09-04 (×4): qty 1

## 2019-09-04 NOTE — Progress Notes (Signed)
Pt up to the nursing station requesting sleep medication , pt educated again about QT  elevation

## 2019-09-04 NOTE — Progress Notes (Signed)
Spirituality group facilitated by Simone Curia, MDiv, [facility].  Group Description:  Group focused on topic of hope.  Patients participated in facilitated discussion around topic, connecting with one another around experiences and definitions for hope.  Group members engaged with visual explorer photos, reflecting on what hope looks like for them today.  Group engaged in discussion around how their definitions of hope are present today in hospital.   Modalities: Psycho-social ed, Adlerian, Narrative, MI Patient Progress: [name] attended majority of group.  He left at end of group time - noting he needed to use restroom.  He did not return.  Was engaged verbally when facilitator inquired of him.  He noted that he experiences hope through "prayer."

## 2019-09-04 NOTE — Progress Notes (Signed)
Adult Psychoeducational Group Note  Date:  09/04/2019 Time:  11:35 PM  Group Topic/Focus:  Wrap-Up Group:   The focus of this group is to help patients review their daily goal of treatment and discuss progress on daily workbooks.  Participation Level:  Active  Participation Quality:  Appropriate  Affect:  Appropriate  Cognitive:  Appropriate  Insight: Appropriate  Engagement in Group:  Developing/Improving  Modes of Intervention:  Discussion  Additional Comments:  Pt stated his goal for today was to focus on his treatment plan. Pt stated he felt he accomplished his goal today. Pt stated his relationship with his family has improved since he was admitted here. Pt stated been able to contact his parents today help improve his overall day. Pt stated he felt better about himself today. Pt rated his overall day a 4 out of 10. Pt stated his appetite was improved today. Pt stated he slept pretty good last night. Pt stated he was in no physical pain today. Pt stated he was not hearing or seeing anything that was not there. Pt stated he had no thoughts of hurting himself or others. Pt stated if anything change he would alert staff.  Albert [name] 09/04/2019, 11:35 PM

## 2019-09-04 NOTE — Progress Notes (Signed)
[facility] MD Progress Note  09/04/2019 1:14 PM Albert [name]  MRN:  [id number] Subjective: Patient is a 43 year old male with a past psychiatric history significant for schizophrenia who was readmitted to the hospital on 09/02/2019 secondary to worsening delusional believes, disorganized thinking, and worsening psychosis.  Objective: Patient is seen and examined.  Patient is a 43 year old male with the above-stated past psychiatric history who is seen in follow-up.  He remains somewhat guarded.  He denied auditory or visual hallucinations.  He is purely focused on medications.  He has his thought process of what he needs and does not need.  He has reportedly skipped some of the medicines here, but then ended up taking them later per his own report.  He continues on Cogentin, Depakote, Prolixin.  His vital signs are stable, he is afebrile.  He slept 5 hours last night.  Review of his laboratories showed a mildly increased glucose at 126.  CBC was normal, the rest of his electrolytes were normal, his Depakote level on admission was less than 10, blood alcohol was negative and drug screen was negative.  His EKG was normal, and QTC was 459.  Principal Problem: <principal problem not specified> Diagnosis: Active Problems:   Schizophrenia ([place])  Total Time spent with patient: 20 minutes  Past Psychiatric History: See admission H&P  Past Medical History:  Past Medical History:  Diagnosis Date  . Alcoholism ([place])   . Drug abuse ([place])    xanax addiction 5 years ago  . Medical non-compliance   . Paranoid schizophrenia ([place])   . Schizophrenia ([place])   . Seizures ([place])    History reviewed. No pertinent surgical history. Family History:  Family History  Problem Relation Age of Onset  . Thyroid disease Mother   . Depression Maternal Aunt    Family Psychiatric  History: See admission H&P Social History:  Social History   Substance and Sexual Activity  Alcohol Use Not Currently     Social  History   Substance and Sexual Activity  Drug Use Not Currently  . Types: Marijuana   Comment: xanax- several years ago    Social History   Socioeconomic History  . Marital status: Single    Spouse name: Not on file  . Number of children: Not on file  . Years of education: Not on file  . Highest education level: Not on file  Occupational History    Comment: Unclear  Social Needs  . Financial resource strain: Not on file  . Food insecurity    Worry: Not on file    Inability: Not on file  . Transportation needs    Medical: Not on file    Non-medical: Not on file  Tobacco Use  . Smoking status: Never Smoker  . Smokeless tobacco: Never Used  . Tobacco comment: Non-smoker  Substance and Sexual Activity  . Alcohol use: Not Currently  . Drug use: Not Currently    Types: Marijuana    Comment: xanax- several years ago  . Sexual activity: Not Currently  Lifestyle  . Physical activity    Days per week: Not on file    Minutes per session: Not on file  . Stress: Not on file  Relationships  . Social Herbalist on phone: Not on file    Gets together: Not on file    Attends religious service: Not on file    Active member of club or organization: Not on file    Attends meetings of clubs or  organizations: Not on file    Relationship status: Not on file  Other Topics Concern  . Not on file  Social History Narrative   ** Merged History Encounter **    Pt reported that he lives in a house apartment; he is followed by Dr. Betti Cruzeddy.  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   Additional Social History:                         Sleep: Fair  Appetite:  Fair  Current Medications: Current Facility-Administered Medications  Medication Dose Route Frequency Provider Last Rate Last Dose  . benztropine (COGENTIN) tablet 1 mg  1 mg Oral BID Malvin JohnsFarah, Brian, MD   1 mg at 09/04/19 16100822  . divalproex (DEPAKOTE) DR tablet  1,000 mg  1,000 mg Oral QHS Rankin, Shuvon B, NP   1,000 mg at 09/03/19 2118  . fluPHENAZine (PROLIXIN) tablet 10 mg  10 mg Oral BID Malvin JohnsFarah, Brian, MD   10 mg at 09/04/19 [id number]  . ibuprofen (ADVIL) tablet 600 mg  600 mg Oral Q6H PRN Malvin JohnsFarah, Brian, MD   600 mg at 09/04/19 1027  . pantoprazole (PROTONIX) EC tablet 40 mg  40 mg Oral Daily Rankin, Shuvon B, NP   40 mg at 09/04/19 [id number]    Lab Results: No results found for this or any previous visit (from the past 48 hour(s)).  Blood Alcohol level:  Lab Results  Component Value Date   ETH <10 09/01/2019   ETH <10 08/16/2019    Metabolic Disorder Labs: Lab Results  Component Value Date   HGBA1C 5.1 01/05/2017   MPG 100 01/05/2017   Lab Results  Component Value Date   PROLACTIN 30.4 (H) 01/06/2017   PROLACTIN 13.1 01/05/2017   Lab Results  Component Value Date   CHOL 172 01/05/2017   TRIG 112 01/05/2017   HDL 41 01/05/2017   CHOLHDL 4.2 01/05/2017   VLDL 22 01/05/2017   LDLCALC 109 (H) 01/05/2017    Physical Findings: AIMS: Facial and Oral Movements Muscles of Facial Expression: None, normal Lips and Perioral Area: None, normal Jaw: None, normal Tongue: None, normal,Extremity Movements Upper (arms, wrists, hands, fingers): None, normal Lower (legs, knees, ankles, toes): None, normal, Trunk Movements Neck, shoulders, hips: None, normal, Overall Severity Severity of abnormal movements (highest score from questions above): None, normal Incapacitation due to abnormal movements: None, normal Patient's awareness of abnormal movements (rate only patient's report): No Awareness, Dental Status Current problems with teeth and/or dentures?: No Does patient usually wear dentures?: No  CIWA:  CIWA-Ar Total: 1 COWS:  COWS Total Score: 0  Musculoskeletal: Strength & Muscle Tone: within normal limits Gait & Station: normal Patient leans: N/A  Psychiatric Specialty Exam: Physical Exam  Nursing note and vitals  reviewed. Constitutional: He is oriented to person, place, and time. He appears well-developed and well-nourished.  HENT:  Head: Normocephalic and atraumatic.  Respiratory: Effort normal.  Neurological: He is alert and oriented to person, place, and time.    ROS  Blood pressure 112/79, pulse 86, temperature 97.8 F (36.6 C), temperature source Oral, resp. rate 20, height 5\' 10"  (1.778 m), weight 103.4 kg, SpO2 98 %.Body mass index is 32.71 kg/m.  General Appearance: Casual  Eye Contact:  Fair  Speech:  Normal Rate  Volume:  Decreased  Mood:  Dysphoric  Affect:  Constricted  Thought Process:  Coherent and Descriptions of Associations: Circumstantial  Orientation:  Full (Time, Place, and Person)  Thought Content:  Paranoid Ideation and Rumination  Suicidal Thoughts:  No  Homicidal Thoughts:  No  Memory:  Immediate;   Fair Recent;   Fair Remote;   Fair  Judgement:  Impaired  Insight:  Lacking  Psychomotor Activity:  Normal  Concentration:  Concentration: Fair and Attention Span: Fair  Recall:  [employer] of Knowledge:  Fair  Language:  Fair  Akathisia:  Negative  Handed:  Right  AIMS (if indicated):     Assets:  Desire for Improvement Resilience  ADL's:  Intact  Cognition:  WNL  Sleep:  Number of Hours: 5     Treatment Plan Summary: Daily contact with patient to assess and evaluate symptoms and progress in treatment, Medication management and Plan : Patient is seen and examined.  Patient is a 43 year old male with the above-stated past psychiatric history who is seen in follow-up.   Diagnosis: #1 schizophrenia  Patient is seen in follow-up.  He seems to be a bit improved with regard to his progress notes during the hospitalization.  He admitted today to noncompliance with his medications.  He still does not feel as though he is sleeping well.  I am going to increase his Prolixin at bedtime to 15 mg p.o. nightly and continue the 10 mg p.o. daily.  No other changes in his  medications at this point. 1.  Continue Cogentin 1 mg p.o. twice daily for side effects of medications. 2.  Continue Depakote ER 1000 mg p.o. nightly for mood stability. 3.  Change Prolixin to 10 mg p.o. daily and 15 mg p.o. nightly for psychosis. 4.  Continue ibuprofen 600 mg p.o. every 6 hours as needed pain. 5.  Continue Protonix 40 mg p.o. daily for reflux disease. 6.  Disposition planning-in progress.  Antonieta Pert, MD 09/04/2019, 1:14 PM

## 2019-09-04 NOTE — Progress Notes (Signed)
Recreation Therapy Notes  Date: 11.6.20 Time: 1000 Location: 500 Hall Dayroom  Group Topic: Communication, Team Building, Problem Solving  Goal Area(s) Addresses:  Patient will effectively work with peer towards shared goal.  Patient will identify skill used to make activity successful.  Patient will identify how skills used during activity can be used to reach post d/c goals.   Behavioral Response:  Engaged  Intervention: STEM Activity   Activity: Metallurgist.  In groups, patients were given 12 pipe cleaner.  Patients were to build a free standing tower as tall as possible using just the pipe cleaner.  Throughout the activity, there were setbacks such as putting one hand behind their backs and not being able to talk.  Education: Education officer, community, Dentist.   Education Outcome: Acknowledges education  Clinical Observations/Feedback:  Pt was engaged and active during group.  Pt worked well with peer.  Pt was appropriate and able to focus on task.  Pt was competitive but within reason.       Victorino Sparrow, LRT/CTRS     Victorino Sparrow A 09/04/2019 11:14 AM

## 2019-09-04 NOTE — Progress Notes (Signed)
DAR NOTE:   D-Pt is alert and oriented x4 . Pt  denied having any HI/SI/AVH.  Pt rated his depression at a level of 0/10 and his anxiety at a level of 0/10.  Pt reported that he slept well last night. Patient denied having any pain.   A-RN provided support and encouragement.  RN administered pt's medications as prescribed by pt's provider.  Maintained q15 min safety checks.   R-Pt stated his goal for today was to talk to the doctor re: his medicines and to also find out some information about  pt's "living situation". RN   observed pt completing word searches and other games and pt appeared to be relaxed.  Pt did not have any adverse reactions with his medications.  Pt remains safe on the unit. Pt stated that he would let RN know if pt needs assistance.

## 2019-09-04 NOTE — Progress Notes (Signed)
   09/04/19 2000  Psych Admission Type (Psych Patients Only)  Admission Status Voluntary  Psychosocial Assessment  Patient Complaints Anxiety  Eye Contact Fair  Facial Expression Sad  Affect Labile;Sad;Fearful;Anxious  Speech Tangential;Pressured  Interaction Assertive  Motor Activity Slow  Appearance/Hygiene Disheveled  Behavior Characteristics Anxious  Mood Anxious;Depressed  Aggressive Behavior  Effect No apparent injury  Thought Process  Coherency WDL  Content WDL  Delusions None reported or observed  Perception WDL  Hallucination None reported or observed  Judgment Impaired  Confusion None  Danger to Self  Current suicidal ideation? Denies  Danger to Others  Danger to Others None reported or observed   Pt visible in the dayroom this evening.

## 2019-09-05 MED ORDER — FLUPHENAZINE HCL 10 MG PO TABS
20.0000 mg | ORAL_TABLET | Freq: Every day | ORAL | Status: DC
  Administered 2019-09-05 – 2019-09-06 (×2): 20 mg via ORAL
  Filled 2019-09-05 (×3): qty 2

## 2019-09-05 MED ORDER — QUETIAPINE FUMARATE 100 MG PO TABS
100.0000 mg | ORAL_TABLET | ORAL | Status: AC
  Administered 2019-09-05: 100 mg via ORAL
  Filled 2019-09-05 (×2): qty 1

## 2019-09-05 MED ORDER — QUETIAPINE FUMARATE 200 MG PO TABS
200.0000 mg | ORAL_TABLET | Freq: Every day | ORAL | Status: DC
  Administered 2019-09-05 – 2019-09-08 (×4): 200 mg via ORAL
  Filled 2019-09-05 (×6): qty 1

## 2019-09-05 MED ORDER — DIVALPROEX SODIUM 500 MG PO DR TAB
1500.0000 mg | DELAYED_RELEASE_TABLET | Freq: Every day | ORAL | Status: DC
  Administered 2019-09-05 – 2019-09-08 (×4): 1500 mg via ORAL
  Filled 2019-09-05 (×7): qty 3

## 2019-09-05 NOTE — Progress Notes (Signed)
Patient has been up in the dayroom watching tv with no interaction with peers. He was concerned that he would not rest well tonight because he reported that he needs seroquel. He appeared relieved to know that it was ordered. He was compliant with his medications. Support given and safety maintained with 15 min checks.

## 2019-09-05 NOTE — Progress Notes (Signed)
Patient ID: Albert [name], male   DOB: 09/08/1976, 43 y.o.   MRN: [id number]   [place] NOVEL CORONAVIRUS (COVID-19) DAILY CHECK-OFF SYMPTOMS - answer yes or no to each - every day NO YES  Have you had a fever in the past 24 hours?  . Fever (Temp > 37.80C / 100F) X   Have you had any of these symptoms in the past 24 hours? . New Cough .  Sore Throat  .  Shortness of Breath .  Difficulty Breathing .  Unexplained Body Aches   X   Have you had any one of these symptoms in the past 24 hours not related to allergies?   . Runny Nose .  Nasal Congestion .  Sneezing   X   If you have had runny nose, nasal congestion, sneezing in the past 24 hours, has it worsened?  X   EXPOSURES - check yes or no X   Have you traveled outside the state in the past 14 days?  X   Have you been in contact with someone with a confirmed diagnosis of COVID-19 or PUI in the past 14 days without wearing appropriate PPE?  X   Have you been living in the same home as a person with confirmed diagnosis of COVID-19 or a PUI (household contact)?    X   Have you been diagnosed with COVID-19?    X              What to do next: Answered NO to all: Answered YES to anything:   Proceed with unit schedule Follow the [facility] Inpatient Flowsheet.

## 2019-09-05 NOTE — Progress Notes (Signed)
[facility] MD Progress Note  09/05/2019 10:03 AM Albert [name]  MRN:  [id number] Subjective:  Patient is a 43 year old male with a past psychiatric history significant for schizophrenia who was readmitted to the hospital on 09/02/2019 secondary to worsening delusional believes, disorganized thinking, and worsening psychosis.  Objective: Patient is seen and examined.  Patient is a 43 year old male with the above-stated past psychiatric history who is seen in follow-up.  He continues to deny auditory or visual hallucinations.  He remains guarded and paranoid.  His sleep is still a significant problem.  I had increased his Depakote as well as his Prolixin last night, and he still only got 1.25 hours of sleep.  He is significantly psychomotor agitated this morning.  He is significantly anxious.  I reviewed his electronic medical record, and saw that he had previously been treated with Seroquel, paliperidone, paliperidone long-acting injection, gabapentin, Geodon, and others.  I could not find that he had been treated with Clozaril in the past.  He basically wants to return to Seroquel or is willing to take Seroquel in addition to the Prolixin.  I also discussed the fact that his previous baseline Depakote dosages have been at 1500 mg and that we were going to change that.  I also stated we were going to increase his Prolixin dose.  His vital signs are stable, he is afebrile.  Sleep as per above.  No new laboratories.  Principal Problem: <principal problem not specified> Diagnosis: Active Problems:   Schizophrenia (HCC)  Total Time spent with patient: 30 minutes  Past Psychiatric History: See admission H&P  Past Medical History:  Past Medical History:  Diagnosis Date  . Alcoholism (HCC)   . Drug abuse (HCC)    xanax addiction 5 years ago  . Medical non-compliance   . Paranoid schizophrenia (HCC)   . Schizophrenia (HCC)   . Seizures (HCC)    History reviewed. No pertinent surgical history. Family  History:  Family History  Problem Relation Age of Onset  . Thyroid disease Mother   . Depression Maternal Aunt    Family Psychiatric  History: See admission H&P Social History:  Social History   Substance and Sexual Activity  Alcohol Use Not Currently     Social History   Substance and Sexual Activity  Drug Use Not Currently  . Types: Marijuana   Comment: xanax- several years ago    Social History   Socioeconomic History  . Marital status: Single    Spouse name: Not on file  . Number of children: Not on file  . Years of education: Not on file  . Highest education level: Not on file  Occupational History    Comment: Unclear  Social Needs  . Financial resource strain: Not on file  . Food insecurity    Worry: Not on file    Inability: Not on file  . Transportation needs    Medical: Not on file    Non-medical: Not on file  Tobacco Use  . Smoking status: Never Smoker  . Smokeless tobacco: Never Used  . Tobacco comment: Non-smoker  Substance and Sexual Activity  . Alcohol use: Not Currently  . Drug use: Not Currently    Types: Marijuana    Comment: xanax- several years ago  . Sexual activity: Not Currently  Lifestyle  . Physical activity    Days per week: Not on file    Minutes per session: Not on file  . Stress: Not on file  Relationships  . Social connections  Talks on phone: Not on file    Gets together: Not on file    Attends religious service: Not on file    Active member of club or organization: Not on file    Attends meetings of clubs or organizations: Not on file    Relationship status: Not on file  Other Topics Concern  . Not on file  Social History Narrative   ** Merged History Encounter **    Pt reported that he lives in a house apartment; he is followed by Dr. Reece Levy.  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   Additional Social History:                         Sleep:  Poor  Appetite:  Fair  Current Medications: Current Facility-Administered Medications  Medication Dose Route Frequency Provider Last Rate Last Dose  . benztropine (COGENTIN) tablet 1 mg  1 mg Oral BID Johnn Hai, MD   1 mg at 09/05/19 0817  . divalproex (DEPAKOTE) DR tablet 1,500 mg  1,500 mg Oral QHS Sharma Covert, MD      . fluPHENAZine (PROLIXIN) tablet 10 mg  10 mg Oral Daily Sharma Covert, MD   10 mg at 09/05/19 0816  . fluPHENAZine (PROLIXIN) tablet 20 mg  20 mg Oral QHS Sharma Covert, MD      . ibuprofen (ADVIL) tablet 600 mg  600 mg Oral Q6H PRN Johnn Hai, MD   600 mg at 09/05/19 0130  . pantoprazole (PROTONIX) EC tablet 40 mg  40 mg Oral Daily Rankin, Shuvon B, NP   40 mg at 09/05/19 0816  . QUEtiapine (SEROQUEL) tablet 100 mg  100 mg Oral NOW Mallie Darting Cordie Grice, MD      . QUEtiapine (SEROQUEL) tablet 200 mg  200 mg Oral QHS Sharma Covert, MD        Lab Results: No results found for this or any previous visit (from the past 48 hour(s)).  Blood Alcohol level:  Lab Results  Component Value Date   ETH <10 09/01/2019   ETH <10 10/93/2355    Metabolic Disorder Labs: Lab Results  Component Value Date   HGBA1C 5.1 01/05/2017   MPG 100 01/05/2017   Lab Results  Component Value Date   PROLACTIN 30.4 (H) 01/06/2017   PROLACTIN 13.1 01/05/2017   Lab Results  Component Value Date   CHOL 172 01/05/2017   TRIG 112 01/05/2017   HDL 41 01/05/2017   CHOLHDL 4.2 01/05/2017   VLDL 22 01/05/2017   LDLCALC 109 (H) 01/05/2017    Physical Findings: AIMS: Facial and Oral Movements Muscles of Facial Expression: None, normal Lips and Perioral Area: None, normal Jaw: None, normal Tongue: None, normal,Extremity Movements Upper (arms, wrists, hands, fingers): None, normal Lower (legs, knees, ankles, toes): None, normal, Trunk Movements Neck, shoulders, hips: None, normal, Overall Severity Severity of abnormal movements (highest score from questions above):  None, normal Incapacitation due to abnormal movements: None, normal Patient's awareness of abnormal movements (rate only patient's report): No Awareness, Dental Status Current problems with teeth and/or dentures?: No Does patient usually wear dentures?: No  CIWA:  CIWA-Ar Total: 1 COWS:  COWS Total Score: 0  Musculoskeletal: Strength & Muscle Tone: within normal limits Gait & Station: normal Patient leans: N/A  Psychiatric Specialty Exam: Physical Exam  Nursing note and vitals reviewed. Constitutional: He is oriented to person, place, and time.  He appears well-developed and well-nourished.  HENT:  Head: Normocephalic and atraumatic.  Respiratory: Effort normal.  Neurological: He is alert and oriented to person, place, and time.    ROS  Blood pressure 111/72, pulse (!) 103, temperature 98.1 F (36.7 C), temperature source Oral, resp. rate 20, height 5\' 10"  (1.778 m), weight 103.4 kg, SpO2 98 %.Body mass index is 32.71 kg/m.  General Appearance: Disheveled  Eye Contact:  Good  Speech:  Pressured  Volume:  Normal  Mood:  Anxious, Depressed and Dysphoric  Affect:  Flat  Thought Process:  Goal Directed and Descriptions of Associations: Circumstantial  Orientation:  Full (Time, Place, and Person)  Thought Content:  Delusions, Hallucinations: Auditory, Paranoid Ideation and Rumination  Suicidal Thoughts:  No  Homicidal Thoughts:  No  Memory:  Immediate;   Fair Recent;   Fair Remote;   Fair  Judgement:  Impaired  Insight:  Lacking  Psychomotor Activity:  Increased  Concentration:  Concentration: Poor and Attention Span: Poor  Recall:  [employer]iservFair  Fund of Knowledge:  Fair  Language:  Good  Akathisia:  Negative  Handed:  Right  AIMS (if indicated):     Assets:  Desire for Improvement Resilience  ADL's:  Intact  Cognition:  WNL  Sleep:  Number of Hours: 1.25     Treatment Plan Summary: Daily contact with patient to assess and evaluate symptoms and progress in treatment,  Medication management and Plan : Patient is seen and examined.  Patient is a 43 year old male with the above-stated past psychiatric history who is seen in follow-up.  Diagnosis: #1 schizophrenia versus schizoaffective disorder; bipolar type  Patient is seen in follow-up.  He is essentially unchanged from yesterday.  He still not sleeping.  He is significantly agitated.  We discussed options today.  I reviewed his electronic medical record looking for some evidence of previous success with medications.  I have a feeling compliance one of his major problems.  We discussed the long-acting paliperidone injection, and he stated that Dr. Betti Cruzeddy "did not want me on that".  Apparently was not effective.  Most of his hospitalizations have included Seroquel one way or another.  I have agreed to at least Seroquel over the weekend, and told him that Dr. Jeannine KittenFarah may not want to continue that.  I am going to give him 100 mg right now just to calm down, and start 200 mg p.o. nightly.  I am going to increase his Depakote DR to 1500 mg p.o. q. p.m.  I am also going to continue the daytime Prolixin but increase his nighttime to 20 mg nightly.  We will get an EKG in a.m. to evaluate his rhythm and QTC.  No other changes in his medications. 1.  Increase Depakote ER to 1500 mg p.o. nightly.  This is for mood stability and sedation. 2.  Increase Prolixin to 10 mg p.o. daily and 20 mg p.o. nightly for psychosis. 3.  Give one-time dose of Seroquel 100 mg now for agitation. 4.  Start Seroquel 200 mg p.o. nightly to assist with sleep and agitation as well as psychosis. 5.  Obtain EKG in a.m. to assess rhythm and QTC. 6.  Disposition planning-in progress.  Antonieta PertGreg Lawson , MD 09/05/2019, 10:03 AM

## 2019-09-05 NOTE — Progress Notes (Signed)
Pt continues to remain up, pt took shower in the middle of the night. Pt given Ibuprofen per MAR for HA 8 out of 10- 10 being worst.

## 2019-09-05 NOTE — [facility] Group Notes (Signed)
  [facility] LCSW Group Therapy Note  Date/Time:  09/05/2019 11:15AM-12:00PM  Type of Therapy and Topic:  Group Therapy:  Feelings About Hospitalization  Participation Level:  Active   Description of Group This process group involved patients discussing their feelings related to being hospitalized, as well as the benefits they see to being in the hospital.  These feelings and benefits were itemized.  The group then brainstormed specific ways in which they could seek those same benefits when they discharge and return home.  Therapeutic Goals 1. Patient will identify and describe positive and negative feelings related to hospitalization 2. Patient will verbalize benefits of hospitalization to themselves personally 3. Patients will brainstorm together ways they can obtain similar benefits in the outpatient setting, identify barriers to wellness and possible solutions  Summary of Patient Progress:  The patient expressed his primary feelings about being hospitalized are okay although he wishes he could be at home where he could go outside when he wanted, because it looks like a very nice day.  He stated that prayer and meditation can help him to stay well in the future.  He was quite sleepy during group and dozed off a couple of times.  Therapeutic Modalities Cognitive Behavioral Therapy Motivational Interviewing    Selmer Dominion, LCSW 09/05/2019, 1:53 PM

## 2019-09-05 NOTE — Progress Notes (Signed)
[facility] Group Notes:  (Nursing/MHT/Case Management/Adjunct)  Date:  09/05/2019  Time:  0900 am  Type of Therapy:  Nurse Education  Participation Level:  Attended group late. Did not participate.  Tasheba Henson L 09/05/2019, 9:50 AM

## 2019-09-05 NOTE — Progress Notes (Signed)
Pt up through the night not trying [place] to sleep.

## 2019-09-05 NOTE — Plan of Care (Signed)
  Problem: Activity: Goal: Interest or engagement in activities will improve Outcome: Progressing   Problem: Coping: Goal: Ability to demonstrate self-control will improve Outcome: Progressing   Problem: Safety: Goal: Periods of time without injury will increase Outcome: Progressing    D: Pt alert and oriented on the unit. Pt engaging with RN staff and other pts. Pt denies SI/HI, A/VH. Pt's goal for today is to "Talk with the doctor about taking Seroquel. Pt rated his depression a 0, anxiety a 3, and feelings of hopelessness a 5, with 10 being the worst.  A: Education, support and encouragement provided, q15 minute safety checks remain in effect. Medications administered per MD orders.  R: No reactions/side effects to medicine noted. Pt denies any concerns at this time, and verbally contracts for safety. Pt ambulating on the unit with no issues. Pt remains safe on and off the unit.

## 2019-09-05 NOTE — Progress Notes (Signed)
Pt up to the nursing station stating he could not sleep in the bed because it was not flat

## 2019-09-06 NOTE — [facility] Group Notes (Signed)
[facility] LCSW Group Therapy Note  Date/Time:  09/06/2019  11:00AM-12:00PM  Type of Therapy and Topic:  Group Therapy:  Music and Mood  Participation Level:  Active   Description of Group: In this process group, members listened to a variety of genres of music and identified that different types of music evoke different responses.  Patients were encouraged to identify music that was soothing for them and music that was energizing for them.  Patients discussed how this knowledge can help with wellness and recovery in various ways including managing depression and anxiety as well as encouraging healthy sleep habits.    Therapeutic Goals: 1. Patients will explore the impact of different varieties of music on mood 2. Patients will verbalize the thoughts they have when listening to different types of music 3. Patients will identify music that is soothing to them as well as music that is energizing to them 4. Patients will discuss how to use this knowledge to assist in maintaining wellness and recovery 5. Patients will explore the use of music as a coping skill  Summary of Patient Progress:  At the beginning of group, patient expressed that he felt anxious in group which is unusual for him.  During group he started talking about coming to a realization that he has anger deep inside him.  At the end of group, patient expressed that he felt a little better, but added that he was "fuming inside."    Therapeutic Modalities: Solution Focused Brief Therapy Activity   Selmer Dominion, LCSW

## 2019-09-06 NOTE — Progress Notes (Signed)
   09/06/19 0900  Psych Admission Type (Psych Patients Only)  Admission Status Voluntary  Psychosocial Assessment  Patient Complaints None  Eye Contact Brief  Facial Expression Angry  Affect Anxious;Irritable;Labile;Preoccupied  Speech Tangential;Rapid  Interaction Assertive;Sarcastic  Motor Activity Slow  Appearance/Hygiene Disheveled  Behavior Characteristics Agressive verbally  Mood Labile  Aggressive Behavior  Effect No apparent injury  Thought Process  Coherency Circumstantial  Content WDL  Delusions None reported or observed  Perception WDL  Hallucination None reported or observed  Judgment Impaired  Confusion None  Danger to Self  Current suicidal ideation? Denies  Danger to Others  Danger to Others None reported or observed

## 2019-09-06 NOTE — Progress Notes (Addendum)
   09/06/19 2017  Psych Admission Type (Psych Patients Only)  Admission Status Voluntary  Psychosocial Assessment  Patient Complaints None  Eye Contact Brief  Facial Expression Angry  Affect Anxious;Irritable;Labile;Preoccupied  Speech Tangential;Rapid  Interaction Assertive;Sarcastic  Motor Activity Slow  Appearance/Hygiene Improved  Behavior Characteristics Agressive verbally  Mood Labile  Aggressive Behavior  Effect No apparent injury  Thought Process  Coherency Circumstantial  Content WDL  Delusions None reported or observed  Perception WDL  Hallucination None reported or observed  Judgment Impaired  Confusion None  Danger to Self  Current suicidal ideation? Denies  Danger to Others  Danger to Others None reported or observed  Safety maintained on unit with 15 min checks.

## 2019-09-06 NOTE — Progress Notes (Signed)
[facility] MD Progress Note  09/06/2019 9:52 AM Eyvonne LeftChristopher Sullenger  MRN:  [id number] Subjective:  Patient is a 43 year old male with a past psychiatric history significant for schizophrenia who was readmitted to the hospital on 09/02/2019 secondary to worsening delusional believes, disorganized thinking, and worsening psychosis.  Objective: Patient is seen and examined.  Patient is a 43 year old male with the above-stated past psychiatric history who is seen in follow-up.  Patient is tired and fatigued this morning, but he did sleep well last night.  This is the first night he slept in several days.  He denied any active auditory or visual hallucinations.  We discussed his psychotic symptoms with regard to his neighbor.  He does not understand how the long-acting injectable medicine will help him with "playing the game that the guy upstairs is doing".  He has minimal insight that these are related to his illness.  He denied any auditory or visual hallucinations currently.  He denied any suicidal or homicidal ideation.  His blood pressure stable, his heart rate is 108.  He is afebrile.  No new laboratories.  He slept 6.75 hours last night.  Principal Problem: <principal problem not specified> Diagnosis: Active Problems:   Schizophrenia (HCC)  Total Time spent with patient: 20 minutes  Past Psychiatric History: See admission H&P  Past Medical History:  Past Medical History:  Diagnosis Date  . Alcoholism (HCC)   . Drug abuse (HCC)    xanax addiction 5 years ago  . Medical non-compliance   . Paranoid schizophrenia (HCC)   . Schizophrenia (HCC)   . Seizures (HCC)    History reviewed. No pertinent surgical history. Family History:  Family History  Problem Relation Age of Onset  . Thyroid disease Mother   . Depression Maternal Aunt    Family Psychiatric  History: See admission H&P Social History:  Social History   Substance and Sexual Activity  Alcohol Use Not Currently     Social History    Substance and Sexual Activity  Drug Use Not Currently  . Types: Marijuana   Comment: xanax- several years ago    Social History   Socioeconomic History  . Marital status: Single    Spouse name: Not on file  . Number of children: Not on file  . Years of education: Not on file  . Highest education level: Not on file  Occupational History    Comment: Unclear  Social Needs  . Financial resource strain: Not on file  . Food insecurity    Worry: Not on file    Inability: Not on file  . Transportation needs    Medical: Not on file    Non-medical: Not on file  Tobacco Use  . Smoking status: Never Smoker  . Smokeless tobacco: Never Used  . Tobacco comment: Non-smoker  Substance and Sexual Activity  . Alcohol use: Not Currently  . Drug use: Not Currently    Types: Marijuana    Comment: xanax- several years ago  . Sexual activity: Not Currently  Lifestyle  . Physical activity    Days per week: Not on file    Minutes per session: Not on file  . Stress: Not on file  Relationships  . Social Musicianconnections    Talks on phone: Not on file    Gets together: Not on file    Attends religious service: Not on file    Active member of club or organization: Not on file    Attends meetings of clubs or organizations: Not on  file    Relationship status: Not on file  Other Topics Concern  . Not on file  Social History Narrative   ** Merged History Encounter **    Pt reported that he lives in a house apartment; he is followed by Dr. Betti Cruz.  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   Additional Social History:                         Sleep: Good  Appetite:  Fair  Current Medications: Current Facility-Administered Medications  Medication Dose Route Frequency Provider Last Rate Last Dose  . benztropine (COGENTIN) tablet 1 mg  1 mg Oral BID Malvin Johns, MD   1 mg at 09/06/19 0758  . divalproex (DEPAKOTE) DR tablet 1,500 mg   1,500 mg Oral QHS Antonieta Pert, MD   1,500 mg at 09/05/19 2107  . fluPHENAZine (PROLIXIN) tablet 10 mg  10 mg Oral Daily Antonieta Pert, MD   10 mg at 09/06/19 0758  . fluPHENAZine (PROLIXIN) tablet 20 mg  20 mg Oral QHS Antonieta Pert, MD   20 mg at 09/05/19 2107  . ibuprofen (ADVIL) tablet 600 mg  600 mg Oral Q6H PRN Malvin Johns, MD   600 mg at 09/05/19 0130  . pantoprazole (PROTONIX) EC tablet 40 mg  40 mg Oral Daily Rankin, Shuvon B, NP   40 mg at 09/06/19 0758  . QUEtiapine (SEROQUEL) tablet 200 mg  200 mg Oral QHS Antonieta Pert, MD   200 mg at 09/05/19 2107    Lab Results: No results found for this or any previous visit (from the past 48 hour(s)).  Blood Alcohol level:  Lab Results  Component Value Date   ETH <10 09/01/2019   ETH <10 08/16/2019    Metabolic Disorder Labs: Lab Results  Component Value Date   HGBA1C 5.1 01/05/2017   MPG 100 01/05/2017   Lab Results  Component Value Date   PROLACTIN 30.4 (H) 01/06/2017   PROLACTIN 13.1 01/05/2017   Lab Results  Component Value Date   CHOL 172 01/05/2017   TRIG 112 01/05/2017   HDL 41 01/05/2017   CHOLHDL 4.2 01/05/2017   VLDL 22 01/05/2017   LDLCALC 109 (H) 01/05/2017    Physical Findings: AIMS: Facial and Oral Movements Muscles of Facial Expression: None, normal Lips and Perioral Area: None, normal Jaw: None, normal Tongue: None, normal,Extremity Movements Upper (arms, wrists, hands, fingers): None, normal Lower (legs, knees, ankles, toes): None, normal, Trunk Movements Neck, shoulders, hips: None, normal, Overall Severity Severity of abnormal movements (highest score from questions above): None, normal Incapacitation due to abnormal movements: None, normal Patient's awareness of abnormal movements (rate only patient's report): No Awareness, Dental Status Current problems with teeth and/or dentures?: No Does patient usually wear dentures?: No  CIWA:  CIWA-Ar Total: 1 COWS:  COWS Total  Score: 0  Musculoskeletal: Strength & Muscle Tone: within normal limits Gait & Station: normal Patient leans: N/A  Psychiatric Specialty Exam: Physical Exam  Nursing note and vitals reviewed. Constitutional: He is oriented to person, place, and time. He appears well-developed and well-nourished.  HENT:  Head: Normocephalic and atraumatic.  Respiratory: Effort normal.  Neurological: He is alert and oriented to person, place, and time.    ROS  Blood pressure 109/82, pulse (!) 108, temperature 97.7 F (36.5 C), temperature source Oral, resp. rate 20, height 5\' 10"  (1.778 m), weight 103.4  kg, SpO2 98 %.Body mass index is 32.71 kg/m.  General Appearance: Disheveled  Eye Contact:  Fair  Speech:  Normal Rate  Volume:  Decreased  Mood:  Dysphoric and : Sedated  Affect:  Flat  Thought Process:  Coherent and Descriptions of Associations: Intact  Orientation:  Full (Time, Place, and Person)  Thought Content:  Delusions, Paranoid Ideation and Rumination  Suicidal Thoughts:  No  Homicidal Thoughts:  No  Memory:  Immediate;   Fair Recent;   Fair Remote;   Fair  Judgement:  Intact  Insight:  Lacking  Psychomotor Activity:  Decreased  Concentration:  Concentration: Fair and Attention Span: Fair  Recall:  [employer] of Knowledge:  Fair  Language:  Good  Akathisia:  Negative  Handed:  Right  AIMS (if indicated):     Assets:  Desire for Improvement Resilience  ADL's:  Intact  Cognition:  WNL  Sleep:  Number of Hours: 6.75     Treatment Plan Summary: Daily contact with patient to assess and evaluate symptoms and progress in treatment, Medication management and Plan : Patient is a 43 year old male with the above-stated past psychiatric history who is seen in follow-up.   Diagnosis: #1 schizophrenia versus schizoaffective disorder; bipolar type  Patient is seen in follow-up.  At least he finally slept last night.  His agitation has decreased.  I told the patient we would leave his  medications alone currently.  He remains on Cogentin, Depakote, Prolixin and 200 mg of Seroquel at at bedtime.  We will order a Depakote level, liver function enzymes and a CBC with differential in the a.m.  No other changes today. 1.  Continue Cogentin 1 mg p.o. twice daily for side effects of medications. 2.  Continue Depakote DR 1500 mg p.o. nightly for mood stability. 3.  Continue Prolixin 10 mg p.o. daily and 20 mg p.o. nightly for psychosis. 4.  Continue Protonix 40 mg p.o. daily for gastric protection. 5.  Continue ibuprofen 600 mg p.o. every 6 hours as needed pain. 6.  Continue Seroquel 200 mg p.o. nightly for psychosis and mood stability. 7.  Depakote level, liver function enzymes and a CBC with differential in a.m. tomorrow. 8.  Disposition planning-in progress.  Sharma Covert, MD 09/06/2019, 9:52 AM

## 2019-09-07 LAB — CBC WITH DIFFERENTIAL/PLATELET
Abs Immature Granulocytes: 0.02 10*3/uL (ref 0.00–0.07)
Basophils Absolute: 0.1 10*3/uL (ref 0.0–0.1)
Basophils Relative: 1 %
Eosinophils Absolute: 0.2 10*3/uL (ref 0.0–0.5)
Eosinophils Relative: 3 %
HCT: 43.1 % (ref 39.0–52.0)
Hemoglobin: 14.4 g/dL (ref 13.0–17.0)
Immature Granulocytes: 0 %
Lymphocytes Relative: 53 %
Lymphs Abs: 3.7 10*3/uL (ref 0.7–4.0)
MCH: 32.3 pg (ref 26.0–34.0)
MCHC: 33.4 g/dL (ref 30.0–36.0)
MCV: 96.6 fL (ref 80.0–100.0)
Monocytes Absolute: 0.5 10*3/uL (ref 0.1–1.0)
Monocytes Relative: 7 %
Neutro Abs: 2.5 10*3/uL (ref 1.7–7.7)
Neutrophils Relative %: 36 %
Platelets: 295 10*3/uL (ref 150–400)
RBC: 4.46 MIL/uL (ref 4.22–5.81)
RDW: 13.2 % (ref 11.5–15.5)
WBC: 6.9 10*3/uL (ref 4.0–10.5)
nRBC: 0 % (ref 0.0–0.2)

## 2019-09-07 LAB — HEPATIC FUNCTION PANEL
ALT: 21 U/L (ref 0–44)
AST: 17 U/L (ref 15–41)
Albumin: 3.6 g/dL (ref 3.5–5.0)
Alkaline Phosphatase: 52 U/L (ref 38–126)
Bilirubin, Direct: <0.1 mg/dL (ref 0.0–0.2)
Total Bilirubin: 0.2 mg/dL — ABNORMAL LOW (ref 0.3–1.2)
Total Protein: 6.6 g/dL (ref 6.5–8.1)

## 2019-09-07 LAB — VALPROIC ACID LEVEL: Valproic Acid Lvl: 97 ug/mL (ref 50.0–100.0)

## 2019-09-07 MED ORDER — PALIPERIDONE ER 6 MG PO TB24
6.0000 mg | ORAL_TABLET | Freq: Every day | ORAL | Status: DC
  Administered 2019-09-08 – 2019-09-09 (×2): 6 mg via ORAL
  Filled 2019-09-07 (×3): qty 1

## 2019-09-07 MED ORDER — PALIPERIDONE PALMITATE ER 234 MG/1.5ML IM SUSY
234.0000 mg | PREFILLED_SYRINGE | Freq: Once | INTRAMUSCULAR | Status: AC
  Administered 2019-09-07: 234 mg via INTRAMUSCULAR
  Filled 2019-09-07: qty 1.5

## 2019-09-07 MED FILL — INVEGA SUSTENNA 156 MG PREF: 156 | 1 days supply | Qty: 1 | Fill #0

## 2019-09-07 NOTE — Progress Notes (Signed)
DAR NOTE:  D-Pt reported that he slept well and he denied having any SI,HI or AVH.  Per pt's Self Inventory Sheet: pt rated his level of depression as a 0/10.  A-RN administered pt his medications as prescribed by pt's physician. RN and MHT maintained pt's safety trough q 15 min safety checks.  RN asked for clarification from pt when RN could not hear what pt was saying to RN.  R-Pt is irritable and suspicious of other people and "their motives."  Pt responded well to redirection that RN provided and  Pt was able to remain calm.  Pt did not experience any adverse reactions to his medications.Pt remains safe on the unit.  RN will continue to monitor.

## 2019-09-07 NOTE — Progress Notes (Signed)
[facility] MD Progress Note  09/07/2019 10:33 AM Albert [name]  MRN:  [id number] Subjective:    No behavioral issues this weekend, patient reports that he is ready to go home soon, he continues to be argumentative about his medication stating "all I need is Seroquel" but he continues to have the delusional belief that the people/landlord perhaps upstairs is plotting against him, talking about him so forth which represents not only paranoia but probable auditory hallucinations  Discussed the fact that I discussed his case with Dr. Betti Cruz who is in favor of long-acting injectable.  Patient has responded to paliperidone before  Principal Problem: Exacerbation of underlying psychotic disorder Diagnosis: Active Problems:   Schizophrenia (HCC)  Total Time spent with patient: 20 minutes  Past Psychiatric History: Multiple past admissions encounters and outpatient care visits  Past Medical History:  Past Medical History:  Diagnosis Date  . Alcoholism (HCC)   . Drug abuse (HCC)    xanax addiction 5 years ago  . Medical non-compliance   . Paranoid schizophrenia (HCC)   . Schizophrenia (HCC)   . Seizures (HCC)    History reviewed. No pertinent surgical history. Family History:  Family History  Problem Relation Age of Onset  . Thyroid disease Mother   . Depression Maternal Aunt    Family Psychiatric  History: No new data Social History:  Social History   Substance and Sexual Activity  Alcohol Use Not Currently     Social History   Substance and Sexual Activity  Drug Use Not Currently  . Types: Marijuana   Comment: xanax- several years ago    Social History   Socioeconomic History  . Marital status: Single    Spouse name: Not on file  . Number of children: Not on file  . Years of education: Not on file  . Highest education level: Not on file  Occupational History    Comment: Unclear  Social Needs  . Financial resource strain: Not on file  . Food insecurity    Worry: Not  on file    Inability: Not on file  . Transportation needs    Medical: Not on file    Non-medical: Not on file  Tobacco Use  . Smoking status: Never Smoker  . Smokeless tobacco: Never Used  . Tobacco comment: Non-smoker  Substance and Sexual Activity  . Alcohol use: Not Currently  . Drug use: Not Currently    Types: Marijuana    Comment: xanax- several years ago  . Sexual activity: Not Currently  Lifestyle  . Physical activity    Days per week: Not on file    Minutes per session: Not on file  . Stress: Not on file  Relationships  . Social Musician on phone: Not on file    Gets together: Not on file    Attends religious service: Not on file    Active member of club or organization: Not on file    Attends meetings of clubs or organizations: Not on file    Relationship status: Not on file  Other Topics Concern  . Not on file  Social History Narrative   ** Merged History Encounter **    Pt reported that he lives in a house apartment; he is followed by Dr. Betti Cruz.  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   Additional Social History:  Sleep: Good  Appetite:  Good  Current Medications: Current Facility-Administered Medications  Medication Dose Route Frequency Provider Last Rate Last Dose  . benztropine (COGENTIN) tablet 1 mg  1 mg Oral BID Malvin JohnsFarah, Kristian Hazzard, MD   1 mg at 09/07/19 16100812  . divalproex (DEPAKOTE) DR tablet 1,500 mg  1,500 mg Oral QHS Antonieta Pertlary, Greg Lawson, MD   1,500 mg at 09/06/19 2128  . fluPHENAZine (PROLIXIN) tablet 10 mg  10 mg Oral Daily Antonieta Pertlary, Greg Lawson, MD   10 mg at 09/07/19 [id number]  . fluPHENAZine (PROLIXIN) tablet 20 mg  20 mg Oral QHS Antonieta Pertlary, Greg Lawson, MD   20 mg at 09/06/19 2128  . ibuprofen (ADVIL) tablet 600 mg  600 mg Oral Q6H PRN Malvin JohnsFarah, Sheenah Dimitroff, MD   600 mg at 09/06/19 1805  . pantoprazole (PROTONIX) EC tablet 40 mg  40 mg Oral Daily Rankin, Shuvon B, NP   40  mg at 09/07/19 0812  . QUEtiapine (SEROQUEL) tablet 200 mg  200 mg Oral QHS Antonieta Pertlary, Greg Lawson, MD   200 mg at 09/06/19 2129    Lab Results:  Results for orders placed or performed during the hospital encounter of 09/01/19 (from the past 48 hour(s))  CBC with Differential/Platelet     Status: None   Collection Time: 09/07/19  6:37 AM  Result Value Ref Range   WBC 6.9 4.0 - 10.5 K/uL   RBC 4.46 4.22 - 5.81 MIL/uL   Hemoglobin 14.4 13.0 - 17.0 g/dL   HCT 54.043.1 98.139.0 - 19.152.0 %   MCV 96.6 80.0 - 100.0 fL   MCH 32.3 26.0 - 34.0 pg   MCHC 33.4 30.0 - 36.0 g/dL   RDW 47.813.2 29.511.5 - 62.115.5 %   Platelets 295 150 - 400 K/uL   nRBC 0.0 0.0 - 0.2 %   Neutrophils Relative % 36 %   Neutro Abs 2.5 1.7 - 7.7 K/uL   Lymphocytes Relative 53 %   Lymphs Abs 3.7 0.7 - 4.0 K/uL   Monocytes Relative 7 %   Monocytes Absolute 0.5 0.1 - 1.0 K/uL   Eosinophils Relative 3 %   Eosinophils Absolute 0.2 0.0 - 0.5 K/uL   Basophils Relative 1 %   Basophils Absolute 0.1 0.0 - 0.1 K/uL   Immature Granulocytes 0 %   Abs Immature Granulocytes 0.02 0.00 - 0.07 K/uL    Comment: Performed at [facility], [address] [place], [place] [address]  Hepatic function panel     Status: Abnormal   Collection Time: 09/07/19  6:37 AM  Result Value Ref Range   Total Protein 6.6 6.5 - 8.1 g/dL   Albumin 3.6 3.5 - 5.0 g/dL   AST 17 15 - 41 U/L   ALT 21 0 - 44 U/L   Alkaline Phosphatase 52 38 - 126 U/L   Total Bilirubin 0.2 (L) 0.3 - 1.2 mg/dL   Bilirubin, Direct <7.8<0.1 0.0 - 0.2 mg/dL   Indirect Bilirubin NOT CALCULATED 0.3 - 0.9 mg/dL    Comment: Performed at [facility], [address] [place], [place] [address]  Valproic acid level     Status: None   Collection Time: 09/07/19  6:37 AM  Result Value Ref Range   Valproic Acid Lvl 97 50.0 - 100.0 ug/mL    Comment: Performed at [facility], [address] [place], [place] [address]    Blood Alcohol level:  Lab  Results  Component Value Date   [facility] <[facility] 09/01/2019  ETH <10 40/05/6760    Metabolic Disorder Labs: Lab Results  Component Value Date   HGBA1C 5.1 01/05/2017   MPG 100 01/05/2017   Lab Results  Component Value Date   PROLACTIN 30.4 (H) 01/06/2017   PROLACTIN 13.1 01/05/2017   Lab Results  Component Value Date   CHOL 172 01/05/2017   TRIG 112 01/05/2017   HDL 41 01/05/2017   CHOLHDL 4.2 01/05/2017   VLDL 22 01/05/2017   LDLCALC 109 (H) 01/05/2017    Physical Findings: AIMS: Facial and Oral Movements Muscles of Facial Expression: None, normal Lips and Perioral Area: None, normal Jaw: None, normal Tongue: None, normal,Extremity Movements Upper (arms, wrists, hands, fingers): None, normal Lower (legs, knees, ankles, toes): None, normal, Trunk Movements Neck, shoulders, hips: None, normal, Overall Severity Severity of abnormal movements (highest score from questions above): None, normal Incapacitation due to abnormal movements: None, normal Patient's awareness of abnormal movements (rate only patient's report): No Awareness, Dental Status Current problems with teeth and/or dentures?: No Does patient usually wear dentures?: No  CIWA:  CIWA-Ar Total: 1 COWS:  COWS Total Score: 0  Musculoskeletal: Strength & Muscle Tone: within normal limits Gait & Station: normal Patient leans: N/A  Psychiatric Specialty Exam: Physical Exam  ROS  Blood pressure 106/80, pulse 94, temperature (!) 97.5 F (36.4 C), temperature source Oral, resp. rate 20, height 5\' 10"  (1.778 m), weight 103.4 kg, SpO2 98 %.Body mass index is 32.71 kg/m.  General Appearance: Casual  Eye Contact:  Minimal  Speech:  Clear and Coherent  Volume:  Decreased  Mood:  Dysphoric  Affect:  Congruent  Thought Process:  Goal Directed and Descriptions of Associations: Circumstantial  Orientation:  Full (Time, Place, and Person)  Thought Content:  Illogical, Delusions and Paranoid Ideation  Suicidal Thoughts:   No  Homicidal Thoughts:  No  Memory:  Immediate;   Fair Recent;   Fair Remote;   Fair  Judgement:  Fair  Insight:  Shallow  Psychomotor Activity:  Normal  Concentration:  Concentration: Fair and Attention Span: Fair  Recall:  [employer] of Knowledge:  Fair  Language:  Fair  Akathisia:  Negative  Handed:  Right  AIMS (if indicated):     Assets:  Resilience Social Support  ADL's:  Intact  Cognition:  WNL  Sleep:  Number of Hours: 4.5     Treatment Plan Summary: Daily contact with patient to assess and evaluate symptoms and progress in treatment and Medication management  Continue current measures but we will transition him from fluphenazine to paliperidone/long-acting and oral, while here no change in precautions continue to monitor for safety Continue reality-based therapies  Johnn Hai, MD 09/07/2019, 10:33 AM

## 2019-09-07 NOTE — Tx Team (Signed)
Interdisciplinary Treatment and Diagnostic Plan Update  09/07/2019 Time of Session: 11:05am Shann Merrick MRN: [id number]  Principal Diagnosis: <principal problem not specified>  Secondary Diagnoses: Active Problems:   Schizophrenia (HCC)   Current Medications:  Current Facility-Administered Medications  Medication Dose Route Frequency Provider Last Rate Last Dose  . benztropine (COGENTIN) tablet 1 mg  1 mg Oral BID Malvin Johns, MD   1 mg at 09/07/19 4270  . divalproex (DEPAKOTE) DR tablet 1,500 mg  1,500 mg Oral QHS Antonieta Pert, MD   1,500 mg at 09/06/19 2128  . ibuprofen (ADVIL) tablet 600 mg  600 mg Oral Q6H PRN Malvin Johns, MD   600 mg at 09/07/19 1327  . [START ON 09/08/2019] paliperidone (INVEGA) 24 hr tablet 6 mg  6 mg Oral Daily Malvin Johns, MD      . pantoprazole (PROTONIX) EC tablet 40 mg  40 mg Oral Daily Rankin, Shuvon B, NP   40 mg at 09/07/19 0812  . QUEtiapine (SEROQUEL) tablet 200 mg  200 mg Oral QHS Antonieta Pert, MD   200 mg at 09/06/19 2129   PTA Medications: Medications Prior to Admission  Medication Sig Dispense Refill Last Dose  . divalproex (DEPAKOTE) 500 MG DR tablet Take 1,000 mg by mouth at bedtime. Mornings; sleeps during the day     . omeprazole (PRILOSEC) 40 MG capsule Take 40 mg by mouth daily as needed (heart burn).       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   Physician Treatment Plan for Primary Diagnosis: <principal problem not specified> Long Term Goal(s): Improvement in symptoms so as ready for discharge Improvement in symptoms so as ready for discharge   Short Term Goals: Ability to verbalize feelings will improve Ability to disclose and discuss  suicidal ideas Ability to demonstrate self-control will improve Ability to identify and develop effective coping behaviors will improve Ability to maintain clinical measurements within normal limits will improve Ability to disclose and discuss suicidal ideas Ability to demonstrate self-control will improve Ability to identify and develop effective coping behaviors will improve Ability to maintain clinical measurements within normal limits will improve  Medication Management: Evaluate patient's response, side effects, and tolerance of medication regimen.  Therapeutic Interventions: 1 to 1 sessions, Unit Group sessions and Medication administration.  Evaluation of Outcomes: Progressing  Physician Treatment Plan for Secondary Diagnosis: Active Problems:   Schizophrenia (HCC)  Long Term Goal(s): Improvement in symptoms so as ready for discharge Improvement in symptoms so as ready for discharge   Short Term Goals: Ability to verbalize feelings will improve Ability to disclose and discuss suicidal ideas Ability to demonstrate self-control will improve Ability to identify and develop effective coping behaviors will improve Ability to maintain clinical measurements within normal limits will improve Ability to disclose and discuss suicidal ideas Ability to demonstrate self-control will improve Ability to identify and develop effective coping behaviors will improve Ability to maintain clinical measurements within normal limits will improve     Medication Management: Evaluate patient's response, side effects, and tolerance of medication regimen.  Therapeutic Interventions: 1 to 1 sessions, Unit Group sessions and Medication administration.  Evaluation of Outcomes: Progressing   RN Treatment Plan for Primary Diagnosis: <principal problem not specified> Long Term Goal(s): Knowledge of disease and therapeutic regimen to maintain health will improve  Short Term Goals: Ability to participate  in decision making will improve, Ability to verbalize feelings will improve, Ability to disclose and discuss suicidal ideas, Ability to identify and develop effective coping behaviors will improve and Compliance with prescribed medications will improve  Medication Management: RN will administer medications as ordered by provider, will assess and evaluate patient's response and provide education to patient for prescribed medication. RN will report any adverse and/or side effects to prescribing provider.  Therapeutic Interventions: 1 on 1 counseling sessions, Psychoeducation, Medication administration, Evaluate responses to treatment, Monitor vital signs and CBGs as ordered, Perform/monitor CIWA, COWS, AIMS and Fall Risk screenings as ordered, Perform wound care treatments as ordered.  Evaluation of Outcomes: Progressing   LCSW Treatment Plan for Primary Diagnosis: <principal problem not specified> Long Term Goal(s): Safe transition to appropriate next level of care at discharge, Engage patient in therapeutic group addressing interpersonal concerns.  Short Term Goals: Engage patient in aftercare planning with referrals and resources and Increase skills for wellness and recovery  Therapeutic Interventions: Assess for all discharge needs, 1 to 1 time with Social worker, Explore available resources and support systems, Assess for adequacy in community support network, Educate family and significant other(s) on suicide prevention, Complete Psychosocial Assessment, Interpersonal group therapy.  Evaluation of Outcomes: Progressing   Progress in Treatment: Attending groups: Yes. Participating in groups: Yes. Taking medication as prescribed: Yes. Toleration medication: Yes. Family/Significant other contact made: Yes, individual(s) contacted:  pt's friend Patient understands diagnosis: No. Discussing patient identified problems/goals with staff: Yes. Medical problems stabilized or resolved:  Yes. Denies suicidal/homicidal ideation: Yes. Issues/concerns per patient self-inventory: No. Other:   New problem(s) identified: No, Describe:  None  New Short Term/Long Term Goal(s): Medication stabilization, elimination of SI thoughts, and development of a comprehensive mental wellness plan.   Patient Goals:  "Get my medications right"  Discharge Plan or Barriers: CSW is working with patient to find some different housing. Patient will be following up with Dr. Reece Levy for aftercare.   Reason for Continuation of Hospitalization: Delusions  Medication stabilization  Estimated Length of Stay: 1-2 days   Attendees: Patient: 09/07/2019  Physician: Dr. Johnn Hai, MD 09/07/2019   Nursing: Benjamine Mola, RN 09/07/2019   RN Care Manager: 09/07/2019  Social Worker: Ardelle Anton, LCSW 09/07/2019  Recreational Therapist:  09/07/2019   Other:  09/07/2019  Other:  09/07/2019   Other: 09/07/2019      Scribe for Treatment Team: Trecia Rogers, LCSW 09/07/2019 2:12 PM

## 2019-09-07 NOTE — Progress Notes (Signed)
Adult Psychoeducational Group Note  Date:  09/07/2019 Time:  12:34 AM  Group Topic/Focus:  Wrap-Up Group:   The focus of this group is to help patients review their daily goal of treatment and discuss progress on daily workbooks.  Participation Level:  Active  Participation Quality:  Appropriate  Affect:  Appropriate  Cognitive:  Appropriate  Insight: Appropriate  Engagement in Group:  Developing/Improving  Modes of Intervention:  Discussion  Additional Comments:  Pt stated his goal for today was to talk with his doctor about a medication issue. Pt stated he was able to accomplished his goal today. Pt stated his relationship with his family has improved since he was admitted here. Pt stated he was able to contact his mother today, which help improved his day. Pt stated he felt better about himself today. Pt rated his overall day a 4 out of 10. Pt stated his appetite was improving today. Pt stated he slept pretty good last night. Pt stated he was in no physical pain today. Pt stated he was not hearing or seeing anything that was not there. Pt stated he had no thoughts of hurting himself or others. Pt stated if anything change he would alert staff.  Melanie Pellot 09/07/2019, 12:34 AM

## 2019-09-07 NOTE — [facility] Suicide Risk Assessment (Signed)
[place] INPATIENT:  Family/Significant Other Suicide Prevention Education  Suicide Prevention Education:  Education Completed; Pt's friend, Architect,  (name of family member/significant other) has been identified by the patient as the family member/significant other with whom the patient will be residing, and identified as the person(s) who will aid the patient in the event of a mental health crisis (suicidal ideations/suicide attempt).  With written consent from the patient, the family member/significant other has been provided the following suicide prevention education, prior to the and/or following the discharge of the patient.  The suicide prevention education provided includes the following:  Suicide risk factors  Suicide prevention and interventions  National Suicide Hotline telephone number  [facility] assessment telephone number  [facility] Emergency Assistance [place] and/or Residential Mobile Crisis Unit telephone number  Request made of family/significant other to:  Remove weapons (e.g., guns, rifles, knives), all items previously/currently identified as safety concern.    Remove drugs/medications (over-the-counter, prescriptions, illicit drugs), all items previously/currently identified as a safety concern.  The family member/significant other verbalizes understanding of the suicide prevention education information provided.  The family member/significant other agrees to remove the items of safety concern listed above.   CSW contacted pt's friend, Architect. Pt's friend stated that he hopes that the patient can obtain some long term treatment. Pt's friend stated that he constantly goes to the hospital due to his mental health. Pt's friend stated that he will go to the hospital, discharge, and end up back in the hospital about 5 days later. Pt's friend stated that he often tries to call him and be a support and stated that he will call him today, but  that the patient needs some treatment.   Trecia Rogers 09/07/2019, 10:41 AM

## 2019-09-07 NOTE — [facility] Group Notes (Signed)
[place] LCSW Group Therapy Note  Date/Time: 09/07/2019 @ 1:30pm  Type of Therapy and Topic:  Group Therapy:  Overcoming Obstacles  Participation Level:  [facility] PARTICIPATION LEVEL: Active  Description of Group:    In this group patients will be encouraged to explore what they see as obstacles to their own wellness and recovery. They will be guided to discuss their thoughts, feelings, and behaviors related to these obstacles. The group will process together ways to cope with barriers, with attention given to specific choices patients can make. Each patient will be challenged to identify changes they are motivated to make in order to overcome their obstacles. This group will be process-oriented, with patients participating in exploration of their own experiences as well as giving and receiving support and challenge from other group members.  Therapeutic Goals: 1. Patient will identify personal and current obstacles as they relate to admission. 2. Patient will identify barriers that currently interfere with their wellness or overcoming obstacles.  3. Patient will identify feelings, thought process and behaviors related to these barriers. 4. Patient will identify two changes they are willing to make to overcome these obstacles:    Summary of Patient Progress   Patient was active and engaged throughout group therapy today. Patient was able to identify a personal and current obstacle of needing to find housing/a new living situation. Patient was able to identify barriers that currently interfere with overcoming his obstacles which is his neighbors and the lack of support that he has within his home. Patient was able to identify changes that he is willing to make to overcome his obstacle which is to contact some housing resources and try to move to another home.     Therapeutic Modalities:   Cognitive Behavioral Therapy Solution Focused Therapy Motivational Interviewing Relapse Prevention  Therapy   Ardelle Anton, LCSW

## 2019-09-07 NOTE — Progress Notes (Signed)
   09/07/19 2100  Psych Admission Type (Psych Patients Only)  Admission Status Voluntary  Psychosocial Assessment  Patient Complaints Anxiety  Eye Contact Brief  Facial Expression Angry  Affect Anxious;Irritable;Labile;Preoccupied  Speech Tangential;Rapid  Interaction Assertive;Sarcastic  Motor Activity Slow  Appearance/Hygiene Improved  Behavior Characteristics Cooperative  Mood Labile  Aggressive Behavior  Effect No apparent injury  Thought Process  Coherency Circumstantial  Content WDL  Delusions None reported or observed  Perception WDL  Hallucination None reported or observed  Judgment Impaired  Confusion None  Danger to Self  Current suicidal ideation? Denies  Danger to Others  Danger to Others None reported or observed   Pt visible on the unit this evening. Pt kept to himself watching TV in the dayroom.

## 2019-09-07 NOTE — Progress Notes (Signed)
Recreation Therapy Notes  Date: 11.9.20 Time: 1000 Location: 500 Hall Dayroom  Group Topic: Coping Skills  Goal Area(s) Addresses:  Patient will identify positive coping skills. Patient will identify benefits of using positive coping skills.  Behavioral Response:  None  Intervention: Worksheet, pencils  Activity: Mind Map.  LRT and patients filled in the first 8 boxes of the mind map (depression, getting into trouble, anger, anxiety, fear, isolation, relationships and finances) together.  Patients were then given time to come up with 3 positive coping skills for each situation identified by the group.    Education: Radiographer, therapeutic, Dentist.   Education Outcome: Acknowledges understanding/In group clarification offered/Needs additional education.   Clinical Observations/Feedback: Pt engaged when prompted.  Pt was asked to give a coping skill for getting in trouble.  Pt took the question personal and asked "why am I in trouble"?   LRT explained he wasn't in trouble but in general terms what would be a coping skill for this.  Pt either didn't get or didn't want to get.  Pt observed for remainder of group.    Victorino Sparrow, LRT/CTRS     Victorino Sparrow A 09/07/2019 11:41 AM

## 2019-09-08 NOTE — Progress Notes (Signed)
DAR NOTE:   D-Pt is alert and oriented x 4. Pt denies  HI/SI/AVH.  Per pt's self-inventory sheet:   Pt rated his depression at a level of  0/10,  anxiety at a level of  0/10, and hopelesness at a 0/10.     A-RN encouraged pt to express his thoughts and feelings.  RN provided support and encouragement.  RN administered pt's medications as prescribed by pt's provider.  Maintained q15 min safety checks.   R-Pt stated that he was feeling better today and that he slept well and his appetite was good.  Pt has been keeping to himself today and not attending group therapy sessions.  Pt remains safe on the unit.  Pt reported that he would let staff know if pt needs assistance.

## 2019-09-08 NOTE — Progress Notes (Signed)
   09/08/19 2100  Psych Admission Type (Psych Patients Only)  Admission Status Voluntary  Psychosocial Assessment  Patient Complaints Anxiety  Eye Contact Brief  Facial Expression Angry  Affect Anxious;Irritable;Labile;Preoccupied  Speech Tangential;Rapid  Interaction Assertive;Sarcastic  Motor Activity Slow  Appearance/Hygiene Improved  Behavior Characteristics Cooperative  Mood Labile  Aggressive Behavior  Effect No apparent injury  Thought Process  Coherency Circumstantial  Content WDL  Delusions None reported or observed  Perception WDL  Hallucination None reported or observed  Judgment Impaired  Confusion None  Danger to Self  Current suicidal ideation? Denies  Danger to Others  Danger to Others None reported or observed   Pt spent much of the evening in the dayroom watching Tv, pt had no disruptive behavior this evening

## 2019-09-08 NOTE — Progress Notes (Signed)
Adult Psychoeducational Group Note  Date:  09/08/2019 Time:  2:58 AM  Group Topic/Focus:  Wrap-Up Group:   The focus of this group is to help patients review their daily goal of treatment and discuss progress on daily workbooks.  Participation Level:  Active  Participation Quality:  Appropriate  Affect:  Appropriate  Cognitive:  Appropriate  Insight: Appropriate  Engagement in Group:  Developing/Improving  Modes of Intervention:  Discussion  Additional Comments:  Pt stated his goal for today was to talk with his doctor about a medication issue. Pt stated he was able to accomplished his goal today. Pt stated his relationship with his family has improved since he was admitted here. Pt stated he was able to contact his friend today, which help improved his day. Pt stated he felt better about himself today. Pt rated his overall day a 8 out of 10. Pt stated his appetite was improving today. Pt stated he slept pretty good last night. Pt stated he was in some pain. Pt stated he had a headache. Pt's nurse was made aware of the situation. Pt stated he was not hearing or seeing anything that was not there. Pt stated he had no thoughts of hurting himself or others. Pt stated if anything change he would alert staff.  Albert [name] 09/08/2019, 2:58 AM

## 2019-09-08 NOTE — Progress Notes (Signed)
[facility] MD Progress Note  09/08/2019 4:48 PM Albert [name]  MRN:  [id number] Subjective:   Patient participated well in group and has had an uneventful day he had a productive weekend as well he is making progress with the current antipsychotic therapy.  He is now alert, oriented still focused on discharge but denying all positive symptoms and when we approach the subject of the paranoia at home he clearly still has this believing landlord or someone is listening in but states he is not going to focus on it at the present time so perhaps he is becoming more reality based Principal Problem: Exacerbation of psychotic disorder diagnosis: Active Problems:   Schizophrenia (HCC)  Total Time spent with patient: 20 minutes  Past Psychiatric History: Multiple admissions  Past Medical History:  Past Medical History:  Diagnosis Date  . Alcoholism (HCC)   . Drug abuse (HCC)    xanax addiction 5 years ago  . Medical non-compliance   . Paranoid schizophrenia (HCC)   . Schizophrenia (HCC)   . Seizures (HCC)    History reviewed. No pertinent surgical history. Family History:  Family History  Problem Relation Age of Onset  . Thyroid disease Mother   . Depression Maternal Aunt    Family Psychiatric  History: No new data Social History:  Social History   Substance and Sexual Activity  Alcohol Use Not Currently     Social History   Substance and Sexual Activity  Drug Use Not Currently  . Types: Marijuana   Comment: xanax- several years ago    Social History   Socioeconomic History  . Marital status: Single    Spouse name: Not on file  . Number of children: Not on file  . Years of education: Not on file  . Highest education level: Not on file  Occupational History    Comment: Unclear  Social Needs  . Financial resource strain: Not on file  . Food insecurity    Worry: Not on file    Inability: Not on file  . Transportation needs    Medical: Not on file    Non-medical: Not on  file  Tobacco Use  . Smoking status: Never Smoker  . Smokeless tobacco: Never Used  . Tobacco comment: Non-smoker  Substance and Sexual Activity  . Alcohol use: Not Currently  . Drug use: Not Currently    Types: Marijuana    Comment: xanax- several years ago  . Sexual activity: Not Currently  Lifestyle  . Physical activity    Days per week: Not on file    Minutes per session: Not on file  . Stress: Not on file  Relationships  . Social Musicianconnections    Talks on phone: Not on file    Gets together: Not on file    Attends religious service: Not on file    Active member of club or organization: Not on file    Attends meetings of clubs or organizations: Not on file    Relationship status: Not on file  Other Topics Concern  . Not on file  Social History Narrative   ** Merged History Encounter **    Pt reported that he lives in a house apartment; he is followed by Dr. Betti Cruzeddy.  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   Additional Social History:  Sleep: Good  Appetite:  Good  Current Medications: Current Facility-Administered Medications  Medication Dose Route Frequency Provider Last Rate Last Dose  . benztropine (COGENTIN) tablet 1 mg  1 mg Oral BID Malvin Johns, MD   1 mg at 09/08/19 1624  . divalproex (DEPAKOTE) DR tablet 1,500 mg  1,500 mg Oral QHS Antonieta Pert, MD   1,500 mg at 09/07/19 2110  . ibuprofen (ADVIL) tablet 600 mg  600 mg Oral Q6H PRN Malvin Johns, MD   600 mg at 09/08/19 1115  . paliperidone (INVEGA) 24 hr tablet 6 mg  6 mg Oral Daily Malvin Johns, MD   6 mg at 09/08/19 0801  . pantoprazole (PROTONIX) EC tablet 40 mg  40 mg Oral Daily Rankin, Shuvon B, NP   40 mg at 09/08/19 0800  . QUEtiapine (SEROQUEL) tablet 200 mg  200 mg Oral QHS Antonieta Pert, MD   200 mg at 09/07/19 2110    Lab Results:  Results for orders placed or performed during the hospital encounter of  09/01/19 (from the past 48 hour(s))  CBC with Differential/Platelet     Status: None   Collection Time: 09/07/19  6:37 AM  Result Value Ref Range   WBC 6.9 4.0 - 10.5 K/uL   RBC 4.46 4.22 - 5.81 MIL/uL   Hemoglobin 14.4 13.0 - 17.0 g/dL   HCT 23.7 62.8 - 31.5 %   MCV 96.6 80.0 - 100.0 fL   MCH 32.3 26.0 - 34.0 pg   MCHC 33.4 30.0 - 36.0 g/dL   RDW 17.6 16.0 - 73.7 %   Platelets 295 150 - 400 K/uL   nRBC 0.0 0.0 - 0.2 %   Neutrophils Relative % 36 %   Neutro Abs 2.5 1.7 - 7.7 K/uL   Lymphocytes Relative 53 %   Lymphs Abs 3.7 0.7 - 4.0 K/uL   Monocytes Relative 7 %   Monocytes Absolute 0.5 0.1 - 1.0 K/uL   Eosinophils Relative 3 %   Eosinophils Absolute 0.2 0.0 - 0.5 K/uL   Basophils Relative 1 %   Basophils Absolute 0.1 0.0 - 0.1 K/uL   Immature Granulocytes 0 %   Abs Immature Granulocytes 0.02 0.00 - 0.07 K/uL    Comment: Performed at [facility], [address], [place], [place] [address]  Hepatic function panel     Status: Abnormal   Collection Time: 09/07/19  6:37 AM  Result Value Ref Range   Total Protein 6.6 6.5 - 8.1 g/dL   Albumin 3.6 3.5 - 5.0 g/dL   AST 17 15 - 41 U/L   ALT 21 0 - 44 U/L   Alkaline Phosphatase 52 38 - 126 U/L   Total Bilirubin 0.2 (L) 0.3 - 1.2 mg/dL   Bilirubin, Direct <9.4 0.0 - 0.2 mg/dL   Indirect Bilirubin NOT CALCULATED 0.3 - 0.9 mg/dL    Comment: Performed at [facility], [address] [place], [place] [address]  Valproic acid level     Status: None   Collection Time: 09/07/19  6:37 AM  Result Value Ref Range   Valproic Acid Lvl 97 50.0 - 100.0 ug/mL    Comment: Performed at [facility], [address] [place], [place] [address]    Blood Alcohol level:  Lab Results  Component Value Date   [facility] <10 09/01/2019   ETH <10 08/16/2019    Metabolic Disorder Labs: Lab Results  Component Value Date   HGBA1C 5.1 01/05/2017  MPG 100 01/05/2017   Lab Results  Component Value  Date   PROLACTIN 30.4 (H) 01/06/2017   PROLACTIN 13.1 01/05/2017   Lab Results  Component Value Date   CHOL 172 01/05/2017   TRIG 112 01/05/2017   HDL 41 01/05/2017   CHOLHDL 4.2 01/05/2017   VLDL 22 01/05/2017   LDLCALC 109 (H) 01/05/2017    Physical Findings: AIMS: Facial and Oral Movements Muscles of Facial Expression: None, normal Lips and Perioral Area: None, normal Jaw: None, normal Tongue: None, normal,Extremity Movements Upper (arms, wrists, hands, fingers): None, normal Lower (legs, knees, ankles, toes): None, normal, Trunk Movements Neck, shoulders, hips: None, normal, Overall Severity Severity of abnormal movements (highest score from questions above): None, normal Incapacitation due to abnormal movements: None, normal Patient's awareness of abnormal movements (rate only patient's report): No Awareness, Dental Status Current problems with teeth and/or dentures?: No Does patient usually wear dentures?: No  CIWA:  CIWA-Ar Total: 1 COWS:  COWS Total Score: 0  Musculoskeletal: Strength & Muscle Tone: within normal limits Gait & Station: normal Patient leans: N/A  Psychiatric Specialty Exam: Physical Exam  ROS  Blood pressure 114/76, pulse 99, temperature (!) 97.5 F (36.4 C), temperature source Oral, resp. rate 20, height 5\' 10"  (1.778 m), weight 103.4 kg, SpO2 98 %.Body mass index is 32.71 kg/m.  General Appearance: Casual  Eye Contact:  Good  Speech:  Normal Rate  Volume:  Normal  Mood:  Euthymic  Affect:  Congruent  Thought Process:  Goal Directed and Descriptions of Associations: Intact  Orientation:  Full (Time, Place, and Person)  Thought Content:  Minimizing previously expressed delusional material probably less intense and less conviction noted and no psychosis as far as hallucinations  Suicidal Thoughts:  No  Homicidal Thoughts:  No  Memory:  Immediate;   Fair Recent;   Fair Remote;   Fair  Judgement:  Fair  Insight:  Fair  Psychomotor  Activity:  Normal  Concentration:  Concentration: Fair  Recall:  [employer] of Knowledge:  Fair  Language:  Fair  Akathisia:  Negative  Handed:  Right  AIMS (if indicated):     Assets:  Communication Skills Physical Health  ADL's:  Intact  Cognition:  WNL  Sleep:  Number of Hours: 6.75     Treatment Plan Summary: Daily contact with patient to assess and evaluate symptoms and progress in treatment and Medication management  Continue antipsychotic therapy continue long-acting injectable no change in precautions monitor for safety continue reality based therapy  Halia Franey, MD 09/08/2019, 4:48 PM

## 2019-09-08 NOTE — Progress Notes (Signed)
Recreation Therapy Notes  Date: 11.10.20 Time: 1000 Location: 500 Dayroom  Group Topic: Wellness  Goal Area(s) Addresses:  Patient will define components of whole wellness. Patient will verbalize benefit of whole wellness.  Intervention: Music    Activity: Exercise.  LRT led group in a series of stretches.  Patients then led the group in an exercise of their choice.  Patients were encouraged to get water if needed and take breaks as needed.  Education: Wellness, Dentist.   Education Outcome: Acknowledges education/In group clarification offered/Needs additional education.   Clinical Observations/Feedback:  Pt did not attend group.     Victorino Sparrow, LRT/CTRS         Victorino Sparrow A 09/08/2019 11:38 AM

## 2019-09-09 DIAGNOSIS — F062 Psychotic disorder with delusions due to known physiological condition: Secondary | ICD-10-CM

## 2019-09-09 MED ORDER — INVEGA SUSTENNA 234 MG/1.5ML IM SUSY: 234.0000 mg | PREFILLED_SYRINGE | INTRAMUSCULAR | 0 refills | Status: DC

## 2019-09-09 MED ORDER — PALIPERIDONE PALMITATE ER 156 MG/ML IM SUSY
156.0000 mg | PREFILLED_SYRINGE | Freq: Once | INTRAMUSCULAR | Status: AC
  Administered 2019-09-09: 156 mg via INTRAMUSCULAR

## 2019-09-09 MED ORDER — PALIPERIDONE ER 6 MG PO TB24: 6.0000 mg | ORAL_TABLET | Freq: Every day | ORAL | 0 refills | Status: DC

## 2019-09-09 MED ORDER — QUETIAPINE FUMARATE 200 MG PO TABS: 200.0000 mg | ORAL_TABLET | Freq: Every day | ORAL | 1 refills | Status: DC

## 2019-09-09 MED ORDER — DIVALPROEX SODIUM 500 MG PO DR TAB: 1500.0000 mg | DELAYED_RELEASE_TABLET | Freq: Every day | ORAL | 2 refills | Status: DC

## 2019-09-09 MED ORDER — BENZTROPINE MESYLATE 1 MG PO TABS: 1.0000 mg | ORAL_TABLET | Freq: Two times a day (BID) | ORAL | 3 refills | Status: DC

## 2019-09-09 NOTE — Progress Notes (Signed)
Recreation Therapy Notes  Date: 11.11.20 Time: 1000 Location: 500 Hall Dayroom  Group Topic: Triggers  Goal Area(s) Addresses:  Patient will identify their biggest triggers. Patient will identify how to deal with triggers head on. Patient will identify how to avoid triggers.  Intervention: Worksheet  Activity: Triggers.  Patients were to identify their three biggest triggers, how they avoid those triggers and how they deal triggers head on when they can't be avoided.  Education: Communication, Discharge Planning  Education Outcome: Acknowledges understanding/In group clarification offered/Needs additional education.   Clinical Observations/Feedback: Pt did not attend group.    Victorino Sparrow, LRT/CTRS         Victorino Sparrow A 09/09/2019 11:56 AM

## 2019-09-09 NOTE — Progress Notes (Signed)
Recreation Therapy Notes  INPATIENT RECREATION TR PLAN  Patient Details Name: Brees Hounshell MRN: [id number] DOB: 11-09-1975 Today's Date: 09/09/2019  Rec Therapy Plan Is patient appropriate for Therapeutic Recreation?: Yes Treatment times per week: about 3 days Estimated Length of Stay: 5-7 days TR Treatment/Interventions: Group participation (Comment)  Discharge Criteria Pt will be discharged from therapy if:: Discharged Treatment plan/goals/alternatives discussed and agreed upon by:: Patient/family  Discharge Summary Short term goals set: See patient care plan Short term goals met: Complete Progress toward goals comments: Groups attended Which groups?: Coping skills, Communication, Other (Comment)(Team building) Reason goals not met: None Therapeutic equipment acquired: N/A Reason patient discharged from therapy: Discharge from hospital Pt/family agrees with progress & goals achieved: Yes Date patient discharged from therapy: 09/09/19    Victorino Sparrow, LRT/CTRS  Ria Comment, Addisyn Leclaire A 09/09/2019, 12:07 PM

## 2019-09-09 NOTE — Plan of Care (Signed)
Pt was able to engage with peers and staff in a pro-social manner at completion of recreation therapy group sessions.   Mikalyn Hermida, LRT/CTRS 

## 2019-09-09 NOTE — Tx Team (Signed)
Interdisciplinary Treatment and Diagnostic Plan Update  09/09/2019 Time of Session: 10:45am Albert [name] MRN: [id number]  Principal Diagnosis: <principal problem not specified>  Secondary Diagnoses: Active Problems:   Schizophrenia (HCC)   Current Medications:  Current Facility-Administered Medications  Medication Dose Route Frequency Provider Last Rate Last Dose  . benztropine (COGENTIN) tablet 1 mg  1 mg Oral BID Malvin Johns, MD   1 mg at 09/09/19 0745  . divalproex (DEPAKOTE) DR tablet 1,500 mg  1,500 mg Oral QHS Antonieta Pert, MD   1,500 mg at 09/08/19 2108  . ibuprofen (ADVIL) tablet 600 mg  600 mg Oral Q6H PRN Malvin Johns, MD   600 mg at 09/09/19 1111  . paliperidone (INVEGA) 24 hr tablet 6 mg  6 mg Oral Daily Malvin Johns, MD   6 mg at 09/09/19 0745  . pantoprazole (PROTONIX) EC tablet 40 mg  40 mg Oral Daily Rankin, Shuvon B, NP   40 mg at 09/09/19 0745  . QUEtiapine (SEROQUEL) tablet 200 mg  200 mg Oral QHS Antonieta Pert, MD   200 mg at 09/08/19 2108   Current Outpatient Medications  Medication Sig Dispense Refill  . benztropine (COGENTIN) 1 MG tablet Take 1 tablet (1 mg total) by mouth 2 (two) times daily. 60 tablet 3  . divalproex (DEPAKOTE) 500 MG DR tablet Take 3 tablets (1,500 mg total) by mouth at bedtime. 90 tablet 2  . omeprazole (PRILOSEC) 40 MG capsule Take 40 mg by mouth daily as needed (heart burn).    . paliperidone (INVEGA SUSTENNA) 234 MG/1.5ML SUSY injection Inject 234 mg into the muscle every 30 (thirty) days. Due 12/10 1.5 mL 0  . [START ON 09/10/2019] paliperidone (INVEGA) 6 MG 24 hr tablet Take 1 tablet (6 mg total) by mouth daily for 14 days. 14 tablet 0  . QUEtiapine (SEROQUEL) 200 MG tablet Take 1 tablet (200 mg total) by mouth at bedtime. 90 tablet 1   PTA Medications: No medications prior to admission.    Patient Stressors: Marital or family conflict Medication change or noncompliance  Patient Strengths: Cytogeneticist for treatment/growth  Treatment Modalities: Medication Management, Group therapy, Case management,  1 to 1 session with clinician, Psychoeducation, Recreational therapy.   Physician Treatment Plan for Primary Diagnosis: <principal problem not specified> Long Term Goal(s): Improvement in symptoms so as ready for discharge Improvement in symptoms so as ready for discharge   Short Term Goals: Ability to verbalize feelings will improve Ability to disclose and discuss suicidal ideas Ability to demonstrate self-control will improve Ability to identify and develop effective coping behaviors will improve Ability to maintain clinical measurements within normal limits will improve Ability to disclose and discuss suicidal ideas Ability to demonstrate self-control will improve Ability to identify and develop effective coping behaviors will improve Ability to maintain clinical measurements within normal limits will improve  Medication Management: Evaluate patient's response, side effects, and tolerance of medication regimen.  Therapeutic Interventions: 1 to 1 sessions, Unit Group sessions and Medication administration.  Evaluation of Outcomes: Adequate for Discharge  Physician Treatment Plan for Secondary Diagnosis: Active Problems:   Schizophrenia (HCC)  Long Term Goal(s): Improvement in symptoms so as ready for discharge Improvement in symptoms so as ready for discharge   Short Term Goals: Ability to verbalize feelings will improve Ability to disclose and discuss suicidal ideas Ability to demonstrate self-control will improve Ability to identify and develop effective coping behaviors will improve Ability to maintain clinical measurements within normal  limits will improve Ability to disclose and discuss suicidal ideas Ability to demonstrate self-control will improve Ability to identify and develop effective coping behaviors will improve Ability to maintain clinical  measurements within normal limits will improve     Medication Management: Evaluate patient's response, side effects, and tolerance of medication regimen.  Therapeutic Interventions: 1 to 1 sessions, Unit Group sessions and Medication administration.  Evaluation of Outcomes: Adequate for Discharge   RN Treatment Plan for Primary Diagnosis: <principal problem not specified> Long Term Goal(s): Knowledge of disease and therapeutic regimen to maintain health will improve  Short Term Goals: Ability to participate in decision making will improve, Ability to verbalize feelings will improve, Ability to disclose and discuss suicidal ideas, Ability to identify and develop effective coping behaviors will improve and Compliance with prescribed medications will improve  Medication Management: RN will administer medications as ordered by provider, will assess and evaluate patient's response and provide education to patient for prescribed medication. RN will report any adverse and/or side effects to prescribing provider.  Therapeutic Interventions: 1 on 1 counseling sessions, Psychoeducation, Medication administration, Evaluate responses to treatment, Monitor vital signs and CBGs as ordered, Perform/monitor CIWA, COWS, AIMS and Fall Risk screenings as ordered, Perform wound care treatments as ordered.  Evaluation of Outcomes: Adequate for Discharge   LCSW Treatment Plan for Primary Diagnosis: <principal problem not specified> Long Term Goal(s): Safe transition to appropriate next level of care at discharge, Engage patient in therapeutic group addressing interpersonal concerns.  Short Term Goals: Engage patient in aftercare planning with referrals and resources and Increase skills for wellness and recovery  Therapeutic Interventions: Assess for all discharge needs, 1 to 1 time with Social worker, Explore available resources and support systems, Assess for adequacy in community support network, Educate  family and significant other(s) on suicide prevention, Complete Psychosocial Assessment, Interpersonal group therapy.  Evaluation of Outcomes: Adequate for Discharge   Progress in Treatment: Attending groups: Yes. Participating in groups: Yes. Taking medication as prescribed: Yes. Toleration medication: Yes. Family/Significant other contact made: Yes, individual(s) contacted:  pt's friend Patient understands diagnosis: Yes. Discussing patient identified problems/goals with staff: Yes. Medical problems stabilized or resolved: Yes. Denies suicidal/homicidal ideation: Yes. Issues/concerns per patient self-inventory: No. Other:   New problem(s) identified: No, Describe:  None  New Short Term/Long Term Goal(s): Medication stabilization, elimination of SI thoughts, and development of a comprehensive mental wellness plan.   Patient Goals:  "get my medications right"  Discharge Plan or Barriers: Patient is going home and is going to follow up with Dr. Reece Levy at [facility].   Reason for Continuation of Hospitalization: Patient is discharging today.   Estimated Length of Stay: Patient is discharging today.   Attendees: Patient:  09/09/2019   Physician: Dr. Johnn Hai, MD 09/09/2019   Nursing: Namon Cirri 09/09/2019   RN Care Manager: 09/09/2019   Social Worker: Ardelle Anton, LCSW 09/09/2019   Recreational Therapist:  09/09/2019   Other: Ovidio Kin, MSW Intern 09/09/2019   Other:  09/09/2019   Other: 09/09/2019      Scribe for Treatment Team: Trecia Rogers, LCSW 09/09/2019 1:10 PM

## 2019-09-09 NOTE — Progress Notes (Signed)
  [facility] Adult Case Management Discharge Plan :  Will you be returning to the same living situation after discharge:  Yes,  Home  At discharge, do you have transportation home?: Yes,  pt's mother  Do you have the ability to pay for your medications: Yes,  medicare   Release of information consent forms completed and in the chart;  Patient's signature needed at discharge.  Patient to Follow up at: Follow-up [place] Follow up on 09/15/2019.   Specialty: Behavioral Health Why: Medication management with Dr. Terrilyn Saver is Tuesday 11/17 at 3:00p.  Please bring your current medications.  Contact information:  [address] [place] [address] [phone]        Jerelyn Charles Follow up on 09/19/2019.   Why: Therapy appointment is Saturday 11/21 at 2:00p. Appt will be held over the phone.  Contact information: [address] [place], [place] [address]  P: [phone] F: [phone]          Next level of care provider has access to [place] and Suicide Prevention discussed: Yes,  pt's friend   Have you used any form of tobacco in the last 30 days? (Cigarettes, Smokeless Tobacco, Cigars, and/or Pipes): No  Has patient been referred to the Quitline?: N/A patient is not a smoker  Patient has been referred for addiction treatment: Yes  Billey Chang, Student-Social Work 09/09/2019, 9:05 AM

## 2019-09-09 NOTE — Discharge Summary (Signed)
Physician Discharge Summary Note  Patient:  Albert [name] is an 43 y.o., male  MRN:  [id number]  DOB:  [birth year]  Patient phone:  [phone] (home)   Patient address:   [address] [place] [address],   Total Time spent with patient: Greater than 30 minutes  Date of Admission:  09/01/2019  Date of Discharge: 09/09/19  Reason for Admission:    Principal Problem: <principal problem not specified>  Discharge Diagnoses: Active Problems:   Schizophrenia [facility])  Past Psychiatric History: Schizophrenia.  Past Medical History:  Past Medical History:  Diagnosis Date  . Alcoholism (HCC)   . Drug abuse (HCC)    xanax addiction 5 years ago  . Medical non-compliance   . Paranoid schizophrenia (HCC)   . Schizophrenia (HCC)   . Seizures (HCC)    History reviewed. No pertinent surgical history.  Family History:  Family History  Problem Relation Age of Onset  . Thyroid disease Mother   . Depression Maternal Aunt    Social History:  Social History   Substance and Sexual Activity  Alcohol Use Not Currently     Social History   Substance and Sexual Activity  Drug Use Not Currently  . Types: Marijuana   Comment: xanax- several years ago    Social History   Socioeconomic History  . Marital status: Single    Spouse name: Not on file  . Number of children: Not on file  . Years of education: Not on file  . Highest education level: Not on file  Occupational History    Comment: Unclear  Social Needs  . Financial resource strain: Not on file  . Food insecurity    Worry: Not on file    Inability: Not on file  . Transportation needs    Medical: Not on file    Non-medical: Not on file  Tobacco Use  . Smoking status: Never Smoker  . Smokeless tobacco: Never Used  . Tobacco comment: Non-smoker  Substance and Sexual Activity  . Alcohol use: Not Currently  . Drug use: Not Currently    Types: Marijuana    Comment: xanax- several years ago  .  Sexual activity: Not Currently  Lifestyle  . Physical activity    Days per week: Not on file    Minutes per session: Not on file  . Stress: Not on file  Relationships  . Social Musician on phone: Not on file    Gets together: Not on file    Attends religious service: Not on file    Active member of club or organization: Not on file    Attends meetings of clubs or organizations: Not on file    Relationship status: Not on file  Other Topics Concern  . Not on file  Social History Narrative   ** Merged History Encounter **    Pt reported that he lives in a house apartment; he is followed by Dr. Betti Cruz.  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   Hospital Course: (Per Md's admission evaluation notes): This is the latest of multiple encounters with our healthcare system for Mr. Albert [name], his third admission this year.  He presented voluntarily on 11/3 with delusional beliefs, disorganized thought, and a clear deviation from his baseline.  His drug screen was negative. On my interview the patient is very focused on his landlord, believing people are talking about him, believing his roommate and her  boyfriend have been talking about him so forth so he is paranoid but does not have any particular specificity to these delusions. He states he is on his medications then denies taking them regularly. When last here in March she was treated with long-acting injectable paliperidone at this point states he is only on Depakote and Prilosec.   This is one several discharged summaries from this [facility]. He is well known on this [facility] adult unit from previous hospitalizations for mood stabilization treatments. He presented this time around with his usual & similar presentations; worsening delusional beliefs, disorganized thoughts & a clear deviation from his baseline. He was brought back to [facility] for evaluation & treatments.  After evaluation  of his presenting symptoms, Lyndle was recommended for mood stabilization treatments. The medication regimen for his presenting symptoms were discussed & with his consent initiated. He received, stabilized & was discharged on the medications as listed below on his discharge medication lists. He was also enrolled & participated in the group counseling sessions being offered & held on this unit. He learned coping skills. He presented on this admission, other medical conditions (GERD) that required treatment & monitoring. He was resumed/discharged on all his pertinent home medications for those health issues. He tolerated his treatment regimen without any adverse effects or reactions reported.   And because of the chronic nature of his psychiatric symptoms & their resistance to the treatment regimen, [name] was treated, stabilized & being discharged on two separate antipsychotic medications (Invega & Seroquel). This is because he has not been able to achieve symptoms control under an antipsychotic monotherapy. These two combination antipsychotic therapies seem effective in stabilizing his symptoms at this time. It will benefit Calahan to continue on these combination antipsychotic therapies as recommended. However, as his symptoms continue to improve, he may then be titrated down to an antipsychotic monotherapy. This is to prevent the chances for the development of metabolic syndrome usually associated with use of multiple antipsychotic regimen. This has to be done within the proper monitoring, discretion & judgement of his outpatient psychiatric provider.  During the course of his hospitalization, the 15-minute checks were adequate to ensure [name]'s safety.  Patient did not display any dangerous, violent or suicidal behavior on the unit.  He interacted with patients & staff appropriately, participated appropriately in the group sessions/therapies. His medications were addressed & adjusted to  meet his needs. He was recommended for outpatient follow-up care & medication management upon discharge to assure his continuity of care & mood stability.  At the time of this hospital discharge, patient is not reporting any acute suicidal/homicidal ideations. He feels more confident about his mental state. He currently denies any new issues or concerns. Education and supportive counseling provided throughout his hospital stay & upon discharge.   Today upon his discharge evaluation with the attending psychiatrist, Jordy shares he is doing well. He denies any other specific concerns. He is sleeping well. His appetite is good. He denies other physical complaints. He denies AH/VH. He feels that his medications have been helpful & is in agreement to continue his current treatment regimen as recommended. He was able to engage in safety planning including plan to return to [facility] or contact emergency services if he feels unable to maintain his own safety or the safety of others. Pt had no further questions, comments, or concerns. He left [facility] with all personal belongings in no apparent distress. Transportation per mother.   Physical Findings: AIMS: Facial and Oral Movements Muscles  of Facial Expression: None, normal Lips and Perioral Area: None, normal Jaw: None, normal Tongue: None, normal,Extremity Movements Upper (arms, wrists, hands, fingers): None, normal Lower (legs, knees, ankles, toes): None, normal, Trunk Movements Neck, shoulders, hips: None, normal, Overall Severity Severity of abnormal movements (highest score from questions above): None, normal Incapacitation due to abnormal movements: None, normal Patient's awareness of abnormal movements (rate only patient's report): No Awareness, Dental Status Current problems with teeth and/or dentures?: No Does patient usually wear dentures?: No  CIWA:  CIWA-Ar Total: 1 COWS:  COWS Total Score: 0  Musculoskeletal: Strength & Muscle Tone: within  normal limits Gait & Station: normal Patient leans: N/A  Psychiatric Specialty Exam: Physical Exam  Nursing note and vitals reviewed. Constitutional: He appears well-developed.  Neck: Normal range of motion.  Cardiovascular: Normal rate.  Respiratory: Effort normal.  Genitourinary:    Genitourinary Comments: Deferred   Musculoskeletal: Normal range of motion.  Neurological: He is alert.  Skin: Skin is warm.    Review of Systems  Constitutional: Negative for chills and fever.  HENT: Negative.   Respiratory: Negative for cough, shortness of breath and wheezing.   Cardiovascular: Negative for chest pain and palpitations.  Gastrointestinal: Negative for abdominal pain, heartburn, nausea and vomiting.  Musculoskeletal: Negative for myalgias.  Skin: Negative.   Neurological: Negative for dizziness and headaches.  Endo/Heme/Allergies: Negative.   Psychiatric/Behavioral: Positive for depression (Stabilized with medication prior to discharge) and hallucinations (Hx. Psychosis (Stabilized with medication prior to discharge)). Negative for memory loss, substance abuse and suicidal ideas. The patient has insomnia (Stabilized with medication prior to discharge ). The patient is not nervous/anxious (Stable).     Blood pressure 125/72, pulse (!) 106, temperature (!) 97.4 F (36.3 C), temperature source Oral, resp. rate 20, height 5\' 10"  (1.778 m), weight 103.4 kg, SpO2 98 %.Body mass index is 32.71 kg/m.  See Md's discharge SRA   Have you used any form of tobacco in the last 30 days? (Cigarettes, Smokeless Tobacco, Cigars, and/or Pipes): No  Has this patient used any form of tobacco in the last 30 days? (Cigarettes, Smokeless Tobacco, Cigars, and/or Pipes):  N/A  Blood Alcohol level:  Lab Results  Component Value Date   ETH <10 09/01/2019   ETH <10 26/94/8546   Metabolic Disorder Labs:  Lab Results  Component Value Date   HGBA1C 5.1 01/05/2017   MPG 100 01/05/2017   Lab Results   Component Value Date   PROLACTIN 30.4 (H) 01/06/2017   PROLACTIN 13.1 01/05/2017   Lab Results  Component Value Date   CHOL 172 01/05/2017   TRIG 112 01/05/2017   HDL 41 01/05/2017   CHOLHDL 4.2 01/05/2017   VLDL 22 01/05/2017   LDLCALC 109 (H) 01/05/2017   See Psychiatric Specialty Exam and Suicide Risk Assessment completed by Attending Physician prior to discharge.  Discharge destination:  Home  Is patient on multiple antipsychotic therapies at discharge: Yes,   Do you recommend tapering to monotherapy for antipsychotics?  Yes   Has Patient had three or more failed trials of antipsychotic monotherapy by history:  Yes,   Antipsychotic medications that previously failed include:   1.  Haldol., 2.  Olanzapine. and 3.  Seroqel.  Recommended Plan for Multiple Antipsychotic Therapies: Additional reason(s) for multiple antispychotic treatment:  This patient has not been able to achieve symptom control under an antipsychotic monotherapy. However, the use of a combination antipsychotic therapies has shown stabilization of patient's symptoms that warranted this discharge. However, in the  event that patient's symptoms subside, he may then be titrated down to an antipsychotic therapy to prevent the devolopment metabolic syndrome associated with use of multiple antipsychotic therapies. This has to be done under the watch & supervision of his outpatient provider.  Allergies as of 09/09/2019   No Known Allergies     Medication List    TAKE these medications     Indication  benztropine 1 MG tablet Commonly known as: COGENTIN Take 1 tablet (1 mg total) by mouth 2 (two) times daily.  Indication: Extrapyramidal Reaction caused by Medications   divalproex 500 MG DR tablet Commonly known as: DEPAKOTE Take 3 tablets (1,500 mg total) by mouth at bedtime. What changed:   how much to take  additional instructions  Indication: Depressive Phase of Manic-Depression   Invega Sustenna 234  MG/1.5ML Susy injection Generic drug: paliperidone Inject 234 mg into the muscle every 30 (thirty) days. Due 12/10  Indication: Schizoaffective Disorder   omeprazole 40 MG capsule Commonly known as: PRILOSEC Take 40 mg by mouth daily as needed (heart burn).  Indication: Gastroesophageal Reflux Disease   paliperidone 6 MG 24 hr tablet Commonly known as: INVEGA Take 1 tablet (6 mg total) by mouth daily for 14 days. Start taking on: September 10, 2019  Indication: Schizoaffective Disorder   QUEtiapine 200 MG tablet Commonly known as: SEROQUEL Take 1 tablet (200 mg total) by mouth at bedtime.  Indication: Depressive Phase of Manic-Depression      Follow-up Information    [facility] Follow up on 09/15/2019.   Specialty: Behavioral Health Why: Medication management with Dr. Ellamae SiaKeshavpal Reddy is Tuesday 11/17 at 3:00p.  Please bring your current medications.  Contact information: [address] [place] [address] [phone]        Sherrilee Gillesobert Milian,LCSW Follow up on 09/19/2019.   Why: Therapy appointment is Saturday 11/21 at 2:00p. Appt will be held over the phone.  Contact information: [address], [place], [place] [address]  P: [phone] F: [phone]         Follow-up recommendations: Activity:  As tolerated Diet: As recommended by your primary care doctor. Keep all scheduled follow-up appointments as recommended.  Comments: Prescriptions given at discharge.  Patient agreeable to plan.  Given opportunity to ask questions.  Appears to feel comfortable with discharge denies any current suicidal or homicidal thought. Patient is also instructed prior to discharge to: Take all medications as prescribed by his/her mental healthcare provider. Report any adverse effects and or reactions from the medicines to his/her outpatient provider promptly. Patient has been instructed & cautioned: To not engage in alcohol and or illegal  drug use while on prescription medicines. In the event of worsening symptoms, patient is instructed to call the crisis hotline, 911 and or go to the nearest ED for appropriate evaluation and treatment of symptoms. To follow-up with his/her primary care provider for your other medical issues, concerns and or health care needs.  Signed: Armandina StammerAgnes Charisa Twitty, NP, pMHNP, FNP-BC 09/09/2019, 4:57 PM

## 2019-09-09 NOTE — BHH Suicide Risk Assessment (Signed)
[facility] Discharge Suicide Risk Assessment   Principal Problem: Exacerbation of psychotic disorder Discharge Diagnoses: Active Problems:   Schizophrenia ([place])   Total Time spent with patient: 45 minutes  Musculoskeletal: Strength & Muscle Tone: within normal limits Gait & Station: normal Patient leans: N/A  Psychiatric Specialty Exam: ROS  Blood pressure 125/72, pulse (!) 106, temperature (!) 97.4 F (36.3 C), temperature source Oral, resp. rate 20, height 5\' 10"  (1.778 m), weight 103.4 kg, SpO2 98 %.Body mass index is 32.71 kg/m.  General Appearance: Casual  Eye Contact::  Good  Speech:  Clear and Coherent409  Volume:  Decreased  Mood:  Euthymic  Affect:  Restricted  Thought Process:  Goal Directed and Descriptions of Associations: Circumstantial  Orientation:  Full (Time, Place, and Person)  Thought Content:  Paranoid Ideation - minimal yest- and denies now  Suicidal Thoughts:  No  Homicidal Thoughts:  No  Memory:  Immediate;   Fair Recent;   Fair Remote;   Fair  Judgement:  Fair  Insight:  Fair  Psychomotor Activity:  Normal  Concentration:  Fair  Recall:  [employer] of Knowledge:Fair  Language: Fair  Akathisia:  Negative  Handed:  Right  AIMS (if indicated):     Assets:  Communication Skills Desire for Improvement Physical Health Resilience  Sleep:  Number of Hours: 6.25  Cognition: WNL  ADL's:  Intact   Mental Status Per Nursing Assessment::   On Admission:  NA  Demographic Factors:  Male and Unemployed  Loss Factors: NA  Historical Factors: NA  Risk Reduction Factors:   Sense of responsibility to family and Religious beliefs about death  Continued Clinical Symptoms:  Bipolar Disorder:   Mixed State  Cognitive Features That Contribute To Risk:  Loss of executive function    Suicide Risk:  Minimal: No identifiable suicidal ideation.  Patients presenting with no risk factors but with morbid ruminations; may be classified as minimal risk based on  the severity of the depressive symptoms  [place] Follow up on 09/15/2019.   Specialty: Behavioral Health Why: Medication management with Dr. Terrilyn Saver is Tuesday 11/17 at 3:00p.  Please bring your current medications.  Contact information: [address] [place] [address] [phone]        Jerelyn Charles Follow up on 09/19/2019.   Why: Therapy appointment is Saturday 11/21 at 2:00p. Appt will be held over the phone.  Contact information: [address] [place], [place] [address]  P: [phone] F: [phone]          Plan Of Care/Follow-up recommendations:  Activity:  full  Zakkary Thibault, MD 09/09/2019, 8:25 AM

## 2019-09-28 ENCOUNTER — Other Ambulatory Visit: Payer: Self-pay

## 2019-09-28 ENCOUNTER — Emergency Department (HOSPITAL_COMMUNITY)
Admission: EM | Admit: 2019-09-28 | Discharge: 2019-09-29 | Disposition: A | Discharge status: Other Acute Inpatient Hospital | Payer: Medicare Other | Attending: Emergency Medicine | Admitting: Emergency Medicine

## 2019-09-28 ENCOUNTER — Encounter (HOSPITAL_COMMUNITY): Payer: Self-pay | Admitting: Emergency Medicine

## 2019-09-28 DIAGNOSIS — Z79899 Other long term (current) drug therapy: Secondary | ICD-10-CM | POA: Insufficient documentation

## 2019-09-28 DIAGNOSIS — F22 Delusional disorders: Secondary | ICD-10-CM

## 2019-09-28 DIAGNOSIS — Z20828 Contact with and (suspected) exposure to other viral communicable diseases: Secondary | ICD-10-CM | POA: Insufficient documentation

## 2019-09-28 DIAGNOSIS — F23 Brief psychotic disorder: Secondary | ICD-10-CM

## 2019-09-28 DIAGNOSIS — F209 Schizophrenia, unspecified: Secondary | ICD-10-CM | POA: Insufficient documentation

## 2019-09-28 DIAGNOSIS — F200 Paranoid schizophrenia: Secondary | ICD-10-CM | POA: Insufficient documentation

## 2019-09-28 LAB — CBC
HCT: 45.1 % (ref 39.0–52.0)
Hemoglobin: 15.5 g/dL (ref 13.0–17.0)
MCH: 33 pg (ref 26.0–34.0)
MCHC: 34.4 g/dL (ref 30.0–36.0)
MCV: 96 fL (ref 80.0–100.0)
Platelets: 322 10*3/uL (ref 150–400)
RBC: 4.7 MIL/uL (ref 4.22–5.81)
RDW: 13.5 % (ref 11.5–15.5)
WBC: 9.7 10*3/uL (ref 4.0–10.5)
nRBC: 0 % (ref 0.0–0.2)

## 2019-09-28 LAB — ETHANOL: Alcohol, Ethyl (B): <10 mg/dL (ref <10)

## 2019-09-28 LAB — COMPREHENSIVE METABOLIC PANEL
ALT: 18 U/L (ref 0–44)
AST: 26 U/L (ref 15–41)
Albumin: 4.2 g/dL (ref 3.5–5.0)
Alkaline Phosphatase: 52 U/L (ref 38–126)
Anion gap: 11 (ref 5–15)
BUN: 11 mg/dL (ref 6–20)
CO2: 21 mmol/L — ABNORMAL LOW (ref 22–32)
Calcium: 9.2 mg/dL (ref 8.9–10.3)
Chloride: 105 mmol/L (ref 98–111)
Creatinine, Ser: 0.75 mg/dL (ref 0.61–1.24)
GFR calc Af Amer: >60 mL/min (ref >60)
GFR calc non Af Amer: >60 mL/min (ref >60)
Glucose, Bld: 101 mg/dL — ABNORMAL HIGH (ref 70–99)
Potassium: 3.9 mmol/L (ref 3.5–5.1)
Sodium: 137 mmol/L (ref 135–145)
Total Bilirubin: 0.7 mg/dL (ref 0.3–1.2)
Total Protein: 7.8 g/dL (ref 6.5–8.1)

## 2019-09-28 LAB — VALPROIC ACID LEVEL: Valproic Acid Lvl: 20 ug/mL — ABNORMAL LOW (ref 50.0–100.0)

## 2019-09-28 LAB — RAPID URINE DRUG SCREEN, HOSP PERFORMED
Amphetamines: NOT DETECTED
Barbiturates: NOT DETECTED
Benzodiazepines: NOT DETECTED
Cocaine: NOT DETECTED
Opiates: NOT DETECTED
Tetrahydrocannabinol: NOT DETECTED

## 2019-09-28 MED ORDER — DIVALPROEX SODIUM 500 MG PO DR TAB
1500.0000 mg | DELAYED_RELEASE_TABLET | Freq: Every day | ORAL | Status: DC
  Administered 2019-09-28: 1500 mg via ORAL
  Filled 2019-09-28: qty 3

## 2019-09-28 MED ORDER — QUETIAPINE FUMARATE 100 MG PO TABS
200.0000 mg | ORAL_TABLET | Freq: Every day | ORAL | Status: DC
  Administered 2019-09-28: 200 mg via ORAL
  Filled 2019-09-28: qty 2

## 2019-09-28 MED ORDER — PALIPERIDONE ER 6 MG PO TB24
6.0000 mg | ORAL_TABLET | Freq: Every day | ORAL | Status: DC
  Filled 2019-09-28: qty 1

## 2019-09-28 MED ORDER — BENZTROPINE MESYLATE 1 MG PO TABS
1.0000 mg | ORAL_TABLET | Freq: Two times a day (BID) | ORAL | Status: DC
  Filled 2019-09-28 (×2): qty 1
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
## 2019-09-28 NOTE — ED Provider Notes (Signed)
[place] DEPT Provider Note   CSN: [id number] Arrival date & time: 09/28/19  1253     History   Chief Complaint Chief Complaint  Patient presents with  . Psychiatric Evaluation    HPI Albert [name] is a 43 y.o. male.     Patient is a 43 year old male with history of schizophrenia, polysubstance abuse.  He presents today for evaluation of hallucinations and paranoia.  Patient tells me there is an individual who lives upstairs from him that he refers to as the "Cocky-boo".  This individual instructs him to masturbate and think about him when he masturbates.  Patient feels conflicted about this and wants to discuss it with someone.  Patient denies SI/HI.  The history is provided by the patient.    Past Medical History:  Diagnosis Date  . Alcoholism ([place])   . Drug abuse ([place])    xanax addiction 5 years ago  . Medical non-compliance   . Paranoid schizophrenia ([place])   . Schizophrenia ([place])   . Seizures [facility])     Patient Active Problem List   Diagnosis Date Noted  . Schizophrenia ([place]) 09/01/2019  . Bipolar disorder ([place]) 01/24/2019  . Schizoaffective disorder ([place]) 01/23/2019  . Paranoid schizophrenia ([place]) 06/05/2018  . Alcohol use disorder, moderate, in sustained remission ([place]) 07/09/2017  . Cannabis use disorder, moderate, in sustained remission ([place]) 07/09/2017  . Elevated TSH 01/05/2017  . Tachycardia 01/05/2017  . Schizoaffective disorder, bipolar type ([place]) 01/04/2017  . Alcoholism ([place])   . Drug abuse ([place])   . Medical non-compliance     History reviewed. No pertinent surgical history.      Home Medications    Prior to Admission medications   Medication Sig Start Date End Date Taking? Authorizing Provider  benztropine (COGENTIN) 1 MG tablet Take 1 tablet (1 mg total) by mouth 2 (two) times daily. 09/09/19   Johnn Hai, MD  divalproex (DEPAKOTE) 500 MG DR tablet Take 3 tablets (1,500 mg total) by mouth at  bedtime. 09/09/19   Johnn Hai, MD  omeprazole (PRILOSEC) 40 MG capsule Take 40 mg by mouth daily as needed (heart burn).    [provider]  paliperidone (INVEGA SUSTENNA) 234 MG/1.5ML SUSY injection Inject 234 mg into the muscle every 30 (thirty) days. Due 12/10 09/09/19   Johnn Hai, MD  paliperidone (INVEGA) 6 MG 24 hr tablet Take 1 tablet (6 mg total) by mouth daily for 14 days. 09/10/19 09/24/19  Johnn Hai, MD  QUEtiapine (SEROQUEL) 200 MG tablet Take 1 tablet (200 mg total) by mouth at bedtime. 09/09/19   Johnn Hai, MD    Family History Family History  Problem Relation Age of Onset  . Thyroid disease Mother   . Depression Maternal Aunt     Social History Social History   Tobacco Use  . Smoking status: Never Smoker  . Smokeless tobacco: Never Used  . Tobacco comment: Non-smoker  Substance Use Topics  . Alcohol use: Not Currently  . Drug use: Not Currently    Types: Marijuana    Comment: xanax- several years ago     Allergies   Patient has no known allergies.   Review of Systems Review of Systems  All other systems reviewed and are negative.    Physical Exam Updated Vital Signs There were no vitals taken for this visit.  Physical Exam Vitals signs and nursing note reviewed.  Constitutional:      General: He is not in acute distress.  Appearance: Normal appearance. He is not ill-appearing, toxic-appearing or diaphoretic.  HENT:     Head: Normocephalic and atraumatic.  Neck:     Musculoskeletal: Normal range of motion.  Cardiovascular:     Rate and Rhythm: Normal rate and regular rhythm.     Heart sounds: No murmur.  Pulmonary:     Effort: Pulmonary effort is normal.     Breath sounds: Normal breath sounds.  Musculoskeletal: Normal range of motion.  Skin:    General: Skin is warm and dry.  Neurological:     General: No focal deficit present.     Mental Status: He is alert and oriented to person, place, and time.      ED  Treatments / Results  Labs (all labs ordered are listed, but only abnormal results are displayed) Labs Reviewed  CBC  COMPREHENSIVE METABOLIC PANEL  ETHANOL  RAPID URINE DRUG SCREEN, HOSP PERFORMED    EKG None  Radiology No results found.  Procedures Procedures (including critical care time)  Medications Ordered in ED Medications - No data to display   Initial Impression / Assessment and Plan / ED Course  I have reviewed the triage vital signs and the nursing notes.  Pertinent labs & imaging results that were available during my care of the patient were reviewed by me and considered in my medical decision making (see chart for details).  Patient delusional and hallucinating as per HPI.  TTS consult pending.  Anticipate admission.  Final Clinical Impressions(s) / ED Diagnoses   Final diagnoses:  None    ED Discharge Orders    None       Geoffery Lyons, MD 09/29/19 [phone]

## 2019-09-28 NOTE — ED Notes (Signed)
TTS consult in progress at present.

## 2019-09-28 NOTE — BHH Counselor (Signed)
Initially pt consented for clinician to call his friend Zadie Rhine [phone]) to obtain collateral information. Towards the end of the assessment, pt declined for clinician to call Ann Maki. Pt expressed she will not call Ann Maki without his consent.    Vertell Novak, [place], [facility], [facility] Triage Specialist [phone]
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
## 2019-09-28 NOTE — ED Notes (Signed)
Pt changed into scrubs, wanded, and belongings placed in [place] locker 28.

## 2019-09-28 NOTE — ED Notes (Signed)
Pt A&O x 4, calm & cooperative at present, no distress noted,  Denies SI, HI or AVH.

## 2019-09-28 NOTE — BH Assessment (Signed)
Request for TTS cart brought to pt's room made by phone.

## 2019-09-28 NOTE — BH Assessment (Signed)
No answer from telecart. Will re-attempt later. RN to move cart to another waiting pt.

## 2019-09-29 ENCOUNTER — Inpatient Hospital Stay (HOSPITAL_COMMUNITY)
Admission: AD | Admit: 2019-09-29 | Discharge: 2019-10-07 | DRG: 885 | Disposition: A | Discharge status: Home / Self Care | Payer: Medicare Other | Source: Intra-hospital | Attending: Psychiatry | Admitting: Psychiatry

## 2019-09-29 DIAGNOSIS — G4700 Insomnia, unspecified: Secondary | ICD-10-CM | POA: Diagnosis present

## 2019-09-29 DIAGNOSIS — R45850 Homicidal ideations: Secondary | ICD-10-CM | POA: Diagnosis present

## 2019-09-29 DIAGNOSIS — F250 Schizoaffective disorder, bipolar type: Principal | ICD-10-CM | POA: Diagnosis present

## 2019-09-29 DIAGNOSIS — F209 Schizophrenia, unspecified: Secondary | ICD-10-CM | POA: Diagnosis present

## 2019-09-29 DIAGNOSIS — Z20828 Contact with and (suspected) exposure to other viral communicable diseases: Secondary | ICD-10-CM | POA: Diagnosis present

## 2019-09-29 DIAGNOSIS — Z9114 Patient's other noncompliance with medication regimen: Secondary | ICD-10-CM | POA: Diagnosis not present

## 2019-09-29 DIAGNOSIS — K219 Gastro-esophageal reflux disease without esophagitis: Secondary | ICD-10-CM | POA: Diagnosis present

## 2019-09-29 DIAGNOSIS — F200 Paranoid schizophrenia: Secondary | ICD-10-CM | POA: Diagnosis not present

## 2019-09-29 LAB — SARS CORONAVIRUS 2 (TAT 6-24 HRS): SARS Coronavirus 2: NEGATIVE

## 2019-09-29 MED ORDER — DIVALPROEX SODIUM 500 MG PO DR TAB
1500.0000 mg | DELAYED_RELEASE_TABLET | Freq: Every day | ORAL | Status: DC
  Administered 2019-09-29 – 2019-10-06 (×8): 1500 mg via ORAL
  Filled 2019-09-29 (×11): qty 3

## 2019-09-29 MED ORDER — BENZTROPINE MESYLATE 1 MG PO TABS
1.0000 mg | ORAL_TABLET | Freq: Two times a day (BID) | ORAL | Status: DC
  Administered 2019-10-04 – 2019-10-07 (×7): 1 mg via ORAL
  Filled 2019-09-29 (×20): qty 1

## 2019-09-29 MED ORDER — QUETIAPINE FUMARATE 200 MG PO TABS
200.0000 mg | ORAL_TABLET | Freq: Every day | ORAL | Status: DC
  Administered 2019-09-29: 200 mg via ORAL
  Filled 2019-09-29 (×2): qty 1

## 2019-09-29 MED ORDER — PALIPERIDONE ER 6 MG PO TB24
6.0000 mg | ORAL_TABLET | Freq: Every day | ORAL | Status: DC
  Filled 2019-09-29: qty 1

## 2019-09-29 NOTE — ED Notes (Signed)
Attempted to call report to [facility]. Mia nurse will return call asap to get report on patient.
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
## 2019-09-29 NOTE — Discharge Summary (Addendum)
  Patient to be transferred to [facility] for inpatient psychiatric treatment  Attest to NP note 

## 2019-09-29 NOTE — ED Notes (Signed)
Report called to Jan at [facility]. [facility] called.

## 2019-09-29 NOTE — BH Assessment (Signed)
[place] Assessment Progress Note  Per Lindon Romp, NP, this pt requires psychiatric hospitalization at this time.  Nonah Mattes, RN has tentatively assigned pt to [facility] Rm 500-2 pending Covid-19 testing with negative result.  The latter has been fulfilled, and pt's nurse, Melody, has been notified.  She has had pt sign Voluntary Admission and Consent for Treatment, which has been faxed to [facility].  She agrees to send original paperwork along with pt via [facility], and to call report to [phone].  Jalene Mullet, [place] Coordinator [phone]

## 2019-09-30 ENCOUNTER — Other Ambulatory Visit: Payer: Self-pay

## 2019-09-30 ENCOUNTER — Encounter (HOSPITAL_COMMUNITY): Payer: Self-pay

## 2019-09-30 DIAGNOSIS — F200 Paranoid schizophrenia: Secondary | ICD-10-CM

## 2019-09-30 MED ORDER — PALIPERIDONE ER 3 MG PO TB24
9.0000 mg | ORAL_TABLET | Freq: Every day | ORAL | Status: DC
  Administered 2019-10-04 – 2019-10-07 (×4): 9 mg via ORAL
  Filled 2019-09-30 (×9): qty 3

## 2019-09-30 MED ORDER — QUETIAPINE FUMARATE 400 MG PO TABS
400.0000 mg | ORAL_TABLET | Freq: Every day | ORAL | Status: DC
  Administered 2019-09-30 – 2019-10-06 (×7): 400 mg via ORAL
  Filled 2019-09-30 (×9): qty 1

## 2019-09-30 NOTE — Progress Notes (Signed)
Recreation Therapy Notes  Patient admitted to unit 12.1.20. Due to admission within last year, no new assessment conducted at this time. Last assessment updated on 11.4.20. Patient reports reason for current admission is a roommate that calls themselves "Cocky Boo".  Pt explained that "Cocky Boo" wanted to play "murder do" and he went along with it.  Pt explained he ended up masturbating and releasing "mana" and felt bad afterwards.  Pt identified his roommate as his main stressor.  In addition to past community resources, pt identified Kristopher Oppenheim and [place] as resources.   Patient denies SI, HI, AVH at this time. Patient reports goal of "try to figure out what I can do to keep from coming here so much".  Information found below from assessment conducted 11.4.30   Coping Skills: Journal, Music, Sports, TV, Music, Exercise, Deep Breathing, Meditated, Talk, Prayer, Art and Reading  Patient Strengths:  Working with teachers at [employer] and be in company with friends.  Areas of Improvement: "letting others know what's going on with me" and construe things and being a careful listening.    Victorino Sparrow, LRT/CTRS   Victorino Sparrow A 09/30/2019 11:38 AM

## 2019-09-30 NOTE — Tx Team (Signed)
Initial Treatment Plan 09/30/2019 12:48 AM Albert [name] OEH:[id number]    PATIENT STRESSORS: Marital or family conflict Medication change or noncompliance   PATIENT STRENGTHS: Capable of independent living General fund of knowledge   PATIENT IDENTIFIED PROBLEMS: Psychosis  "help with problem"                   DISCHARGE CRITERIA:  Improved stabilization in mood, thinking, and/or behavior Verbal commitment to aftercare and medication compliance  PRELIMINARY DISCHARGE PLAN: Attend PHP/IOP Outpatient therapy  PATIENT/FAMILY INVOLVEMENT: This treatment plan has been presented to and reviewed with the patient, Albert [name].  The patient and family have been given the opportunity to ask questions and make suggestions.  Providence Crosby, RN 09/30/2019, 12:48 AM

## 2019-09-30 NOTE — Progress Notes (Signed)
Admission Note:  D:43 yr male who presents [facility] in no acute distress for the treatment of Psychosis and bizarre behavior. Pt appears flat and depressed. Pt was calm and cooperative with admission process. Pt denied SI/ HI/ AVH. Pt endorsed his problem was the same as what brought him to the hospital.   Pt reported, "this guys lives upstairs claims he wants to be my [place]." Pt reported, the guy told him he wants to play "Murder Do," where Cocky Boo takes deeps breaths (sexually) and make the pt feel sexual. Per pt, Cocky Boo said, "I hope you feel retarded." Per pt, Cocky Boo wanted the pt to masturbate while listening to him deep breathe. Pt reported, he made a plea with him. Pt reported, "this guys lives upstairs claims he wants to be my Cocky Boo." Pt reported, the guy told him he wants to play "Murder Do," where Cocky Boo takes deeps breaths (sexually) and make the pt feel sexual. Per pt, Cocky Boo said, "I hope you feel retarded." Per pt, Cocky Boo wanted the pt to masturbate while listening to him deep breathe. Pt reported, he made a plea with him.   A:Skin was assessed and found to be clear of any abnormal marks . PT searched and no contraband found, POC and unit policies explained and understanding verbalized. Consents obtained. Food and fluids offered, and  Accepted  R:Pt had no additional questions or concerns.

## 2019-09-30 NOTE — Progress Notes (Signed)
Patient denies SI, HI and AVH this shift.  Patient is easily irritated and hostile toward staff.   Assess patient for safety, offer medications as prescribed engage patient in 1:1 staff talks.   Patient able to contract for safety. Continue to monitor as planned.

## 2019-09-30 NOTE — Progress Notes (Signed)
Recreation Therapy Notes  Date: 12.2.20 Time: 1000 Location: 500 Hall Dayroom  Group Topic: Communication  Goal Area(s) Addresses:  Patient will effectively communicate with peers in group.  Patient will verbalize benefit of healthy communication. Patient will verbalize positive effect of healthy communication on post d/c goals.  Patient will identify communication techniques that made activity effective for group.   Behavioral Response: Engaged  Intervention: Drawings, Pencils, Blank Paper   Activity: Geometrical Drawings.  One person comes up a describes a geometrical drawing to the rest of the group.  The presenter will get as detailed as needed to describe the picture.  The remaining members of the group can only ask the presenter to repeat themselves.  They can not ask any specific questions.  When the presenter is finished, the group will compare their pictures to the original.  Education: Communication, Discharge Planning  Education Outcome: Acknowledges understanding/In group clarification offered/Needs additional education.   Clinical Observations/Feedback:  Pt was engaged in group and appropriate.  Pt was called out of group to meet with Education officer, museum. Pt returned at the end of group.    Victorino Sparrow, LRT/CTRS    Victorino Sparrow A 09/30/2019 11:20 AM

## 2019-09-30 NOTE — Tx Team (Signed)
Interdisciplinary Treatment and Diagnostic Plan Update  09/30/2019 Time of Session: 10:00am Albert [name] MRN: [id number]  Principal Diagnosis: <principal problem not specified>  Secondary Diagnoses: Active Problems:   Schizophrenia ([place])   Current Medications:  Current Facility-Administered Medications  Medication Dose Route Frequency Provider Last Rate Last Dose  . benztropine (COGENTIN) tablet 1 mg  1 mg Oral BID Rankin, Shuvon B, NP      . divalproex (DEPAKOTE) DR tablet 1,500 mg  1,500 mg Oral QHS Rankin, Shuvon B, NP   1,500 mg at 09/29/19 2235  . paliperidone (INVEGA) 24 hr tablet 9 mg  9 mg Oral Daily Johnn Hai, MD      . QUEtiapine (SEROQUEL) tablet 400 mg  400 mg Oral QHS Johnn Hai, MD       PTA Medications: Medications Prior to Admission  Medication Sig Dispense Refill Last Dose  . benztropine (COGENTIN) 1 MG tablet Take 1 tablet (1 mg total) by mouth 2 (two) times daily. 60 tablet 3   . divalproex (DEPAKOTE) 500 MG DR tablet Take 3 tablets (1,500 mg total) by mouth at bedtime. 90 tablet 2   . omeprazole (PRILOSEC) 40 MG capsule Take 40 mg by mouth daily as needed (heart burn).     . paliperidone (INVEGA SUSTENNA) 234 MG/1.5ML SUSY injection Inject 234 mg into the muscle every 30 (thirty) days. Due 12/10 1.5 mL 0   . paliperidone (INVEGA) 6 MG 24 hr tablet Take 1 tablet (6 mg total) by mouth daily for 14 days. 14 tablet 0   . QUEtiapine (SEROQUEL) 200 MG tablet Take 1 tablet (200 mg total) by mouth at bedtime. 90 tablet 1     Patient Stressors: Marital or family conflict Medication change or noncompliance  Patient Strengths: Capable of independent living General fund of knowledge  Treatment Modalities: Medication Management, Group therapy, Case management,  1 to 1 session with clinician, Psychoeducation, Recreational therapy.   Physician Treatment Plan for Primary Diagnosis: <principal problem not specified> Long Term Goal(s): Improvement in symptoms  so as ready for discharge Improvement in symptoms so as ready for discharge   Short Term Goals: Ability to verbalize feelings will improve Ability to disclose and discuss suicidal ideas Ability to identify and develop effective coping behaviors will improve Ability to identify and develop effective coping behaviors will improve Ability to maintain clinical measurements within normal limits will improve Compliance with prescribed medications will improve Ability to identify triggers associated with substance abuse/mental health issues will improve  Medication Management: Evaluate patient's response, side effects, and tolerance of medication regimen.  Therapeutic Interventions: 1 to 1 sessions, Unit Group sessions and Medication administration.  Evaluation of Outcomes: Not Met  Physician Treatment Plan for Secondary Diagnosis: Active Problems:   Schizophrenia ([place])  Long Term Goal(s): Improvement in symptoms so as ready for discharge Improvement in symptoms so as ready for discharge   Short Term Goals: Ability to verbalize feelings will improve Ability to disclose and discuss suicidal ideas Ability to identify and develop effective coping behaviors will improve Ability to identify and develop effective coping behaviors will improve Ability to maintain clinical measurements within normal limits will improve Compliance with prescribed medications will improve Ability to identify triggers associated with substance abuse/mental health issues will improve     Medication Management: Evaluate patient's response, side effects, and tolerance of medication regimen.  Therapeutic Interventions: 1 to 1 sessions, Unit Group sessions and Medication administration.  Evaluation of Outcomes: Not Met   RN Treatment Plan for Primary Diagnosis: <  principal problem not specified> Long Term Goal(s): Knowledge of disease and therapeutic regimen to maintain health will improve  Short Term Goals: Ability  to verbalize frustration and anger appropriately will improve, Ability to identify and develop effective coping behaviors will improve and Compliance with prescribed medications will improve  Medication Management: RN will administer medications as ordered by provider, will assess and evaluate patient's response and provide education to patient for prescribed medication. RN will report any adverse and/or side effects to prescribing provider.  Therapeutic Interventions: 1 on 1 counseling sessions, Psychoeducation, Medication administration, Evaluate responses to treatment, Monitor vital signs and CBGs as ordered, Perform/monitor CIWA, COWS, AIMS and Fall Risk screenings as ordered, Perform wound care treatments as ordered.  Evaluation of Outcomes: Not Met   LCSW Treatment Plan for Primary Diagnosis: <principal problem not specified> Long Term Goal(s): Safe transition to appropriate next level of care at discharge, Engage patient in therapeutic group addressing interpersonal concerns.  Short Term Goals: Engage patient in aftercare planning with referrals and resources, Increase social support, Increase emotional regulation, Identify triggers associated with mental health/substance abuse issues and Increase skills for wellness and recovery  Therapeutic Interventions: Assess for all discharge needs, 1 to 1 time with Social worker, Explore available resources and support systems, Assess for adequacy in community support network, Educate family and significant other(s) on suicide prevention, Complete Psychosocial Assessment, Interpersonal group therapy.  Evaluation of Outcomes: Not Met  Progress in Treatment: Attending groups: No. Participating in groups: No. Taking medication as prescribed: Yes. Toleration medication: Yes. Family/Significant other contact made: No, will contact:  supports if consents are granted. Patient understands diagnosis: Yes. Discussing patient identified problems/goals  with staff: Yes. Medical problems stabilized or resolved: No. Denies suicidal/homicidal ideation: Yes. Issues/concerns per patient self-inventory: Yes.  New problem(s) identified: Yes, Describe:  limited supports, frequent hospitalizations.  New Short Term/Long Term Goal(s): medication management for mood stabilization; elimination of SI thoughts; development of comprehensive mental wellness/sobriety plan.  Patient Goals: Manage symptoms and medication to "stay out of the hospital."  Discharge Plan or Barriers: Expected to return home and follow up with Dr.Reddy. Declines ACTT referrals at this time.   Reason for Continuation of Hospitalization: Anxiety Delusions  Medication stabilization Suicidal ideation  Estimated Length of Stay: 3-5 days  Attendees: Patient: Albert [name] 09/30/2019 10:14 AM  Physician: Dr.Farah 09/30/2019 10:14 AM  Nursing: Vladimir Faster, RN 09/30/2019 10:14 AM  RN Care Manager: 09/30/2019 10:14 AM  Social Worker: Stephanie Acre, LCSWA 09/30/2019 10:14 AM  Recreational Therapist:  09/30/2019 10:14 AM  Other:  09/30/2019 10:14 AM  Other:  09/30/2019 10:14 AM  Other: 09/30/2019 10:14 AM    Scribe for Treatment Team: [name], LCSWA 09/30/2019 10:14 AM
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
## 2019-09-30 NOTE — Progress Notes (Signed)
Pt has been visible in the dayroom. Pt took his medications this evening and pt appeared to be in a better mood toward Probation officer. Pt was pleasant and appropriate when getting his medications.
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
## 2019-09-30 NOTE — BHH Counselor (Signed)
CSW attempted to complete PSA with patient. Patient became agitated and PSA was unable to be completed.    CSW team will continue to follow up.  Assunta Curtis, MSW, LCSW 09/30/2019 11:53 AM
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
## 2019-09-30 NOTE — Progress Notes (Signed)
Pt was asked to give the staff some space in the nursing station. Pt came back a few minutes later " I DON'T APPRECIATE YOU TELLING ME TO [place] OFF" pt was informed  that was not told . Pt continued to verbally curse at Probation officer. Pt continued to talk derogatory toward Probation officer and stated he was going to continue to Administrator, sports all night. Pt has  Done this in the past repeatedly. Pt continued to say derogatory things toward writer with name calling , even after Probation officer asked pt to remove himself from the nursing station and continued to state he was going to make the night very difficult for Probation officer.

## 2019-09-30 NOTE — Progress Notes (Signed)
   09/30/19 2100  Psych Admission Type (Psych Patients Only)  Admission Status Voluntary  Psychosocial Assessment  Patient Complaints Anxiety;Irritability  Eye Contact Brief  Facial Expression Angry  Affect Anxious;Labile;Preoccupied  Engineer, maintenance (IT);Assertive  Motor Activity Slow  Appearance/Hygiene In scrubs  Behavior Characteristics Agressive verbally;Posturing;Anxious  Mood Labile  Aggressive Behavior  Effect No apparent injury  Thought Process  Coherency Circumstantial  Content Blaming others;Preoccupation  Delusions None reported or observed  Perception WDL  Hallucination None reported or observed  Judgment Impaired  Confusion None  Danger to Self  Current suicidal ideation? Denies  Danger to Others  Danger to Others None reported or observed

## 2019-09-30 NOTE — [facility] Counselor (Signed)
CSW attempted to meet with patient to complete PSA. Patient is too agitated to engage in assessment at this time.  CSW will follow up.  Stephanie Acre, MSW, [place] Social Worker [facility]  [phone]

## 2019-10-01 NOTE — Progress Notes (Signed)
Patient denies SI, HI and AVH this shift. Patient has been less irritable and confrontational toward staff this shift.  Patient has been in his room resting for the majority of the shift.   Assess patient for safety, offer medications as prescribed engage patient in 1:1 staff talks.   Patient able to contract for safety. Continue to monitor as planned. 

## 2019-10-01 NOTE — BHH Counselor (Signed)
Adult Comprehensive Assessment  Patient ID: Albert [name], male   DOB: 04-28-1976, 43 y.o.   MRN: [id number]  Information Source: Information source: Patient and chart review  Current Stressors: Patient states their primary concerns and needs for treatment are:: Patient presents for bizarre behavior. Patient continues to exhibit delusions regarding his landlord and neighbors conspiring against him; he presents this admission with hypersexual focus Patient states their goals for this hospitilization and ongoing recovery are:: "Try to stay out of the hospital." Educational / Learning stressors: Pt denies stressors. Employment / Job issues: Patient is currently on disability and unemployed. Family Relationships: Patient reports that he has firm boundaries with his family members. Financial / Lack of resources (include bankruptcy): Patient reports he lives on a fixed income. Housing / Lack of housing: Ongoing issues with roommates and landlord.  Physical health (include injuries & life threatening diseases): Pt denies stressors Social relationships: Patient struggles to get along with others. Patient has limited supports. Patient was active in an AA group was was banned in early 2020 and this continues to upset him. Substance abuse: Patient denies substance use. Used to attend daily AA meetings. Bereavement / Loss: Patient denies stressors.  Living/Environment/Situation: Living Arrangements: Non-relatives/Friends(with roommates) Living conditions (as described by patient or guardian): Patient has experienced ongoing conflict with roommate and landlord. Who else lives in the home?: A roommate How long has patient lived in current situation?: Since 2007. What is atmosphere in current home: Comfortable, Dangerous  Family History: Marital status: Single Are you sexually active?: No Does patient have children?: No  Childhood History: By whom was/is the patient raised?: Both  parents Additional childhood history information: Pt reports that his mother mainly took care of his family Description of patient's relationship with caregiver when they were a child: "Good" Patient's description of current relationship with people who raised him/her: "Good" Does patient have siblings?: Yes Number of Siblings: 3(pt has a twin sister and a half brother/sister) Description of patient's current relationship with siblings: "Pretty good" Did patient suffer any verbal/emotional/physical/sexual abuse as a child?: No Did patient suffer from severe childhood neglect?: No Has patient ever been sexually abused/assaulted/raped as an adolescent or adult?: No Was the patient ever a victim of a crime or a disaster?: No Witnessed domestic violence?: No Has patient been effected by domestic violence as an adult?: No  Education: Highest grade of school patient has completed: 12th grade Currently a student?: No Learning disability?: No  Employment/Work Situation: Employment situation: On disability Why is patient on disability: Bipolar/Depression How long has patient been on disability: 5 years Patient's job has been impacted by current illness: No What is the longest time patient has a held a job?: 3 years Where was the patient employed at that time?: [employer] Did You Receive Any Psychiatric Treatment/Services While in the 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 If attempted suicide, did drugs/alcohol play a role in this?: No Alcohol/Substance Abuse Treatment Hx: Attends AA/NA If yes, describe treatment: Patient attends AA meetings. Has alcohol/substance abuse ever caused legal problems?: No  Social  Support System: Patient's Community Support System: Good Describe Community Support System: Family Type of faith/religion: Catholic How does patient's faith help to cope with current illness?: N/A  Leisure/Recreation: Leisure and Hobbies: Bike riding,  journaling, conversations with others  Strengths/Needs: What is the patient's perception of their strengths?: Honesty, loyalty and integrity. Learning to have good human relations Patient states they can use these personal strengths during their treatment to contribute to their recovery: N/A Patient states these barriers may affect/interfere with their treatment: N/A Patient states these barriers may affect their return to the community: Patient not knowing if he can go back home with his current roommate. Other important information patient would like considered in planning for their treatment: N/A  Discharge Plan: Currently receiving community mental health services: Yes (From Whom)(Dr. Reece Levy and [facility] Salvia) Patient states concerns and preferences for aftercare planning are: Patient stated that he wants to go back to Dr. Reece Levy for his medications. Patient has been unable to see his therapist since early in the [place] 19 pandemic, but wishes to resume seeing this provider when he can. Patient has declined ACTT referrals, although it is highly recommended due to his [facility] admissions in 2020 and history of medication non-compliance.  Patient states they will know when they are safe and ready for discharge when: "I don't know yet" Does patient have access to transportation?: Yes Does patient have financial barriers related to discharge medications?: No Will patient be returning to same living situation after discharge?: Yes, but will be provided housing resources to relocate after discharge.  Summary/Recommendations:   Summary and Recommendations (to be completed by the evaluator): Albert [name] is a 43 year old male who who presents  voluntarily from [facility]. Patient presents for bizarre behavior. Patient continues to exhibit delusions regarding his landlord and neighbors conspiring against him; he presents this admission with hypersexual focus. Patients diagnosis is: Schizophrenia ([place]). Patient has declined ACTT referrals, although it is highly recommended due to his 52 [facility] admissions in 2020 and history of medication non-compliance. Recommendations for pt include: crisis stabilization, therapeutic milieu, medication management, attend and participate in grouop therapy, and development of a comprehensive mental wellness plan.  Albert [name]. 10/01/2019

## 2019-10-01 NOTE — BHH Suicide Risk Assessment (Signed)
[place] INPATIENT:  Family/Significant Other Suicide Prevention Education  Suicide Prevention Education:  Patient Refusal for Family/Significant Other Suicide Prevention Education: The patient Albert [name] has refused to provide written consent for family/significant other to be provided Family/Significant Other Suicide Prevention Education during admission and/or prior to discharge.  Physician notified.  SPE reviewed with patient.  [name] 10/01/2019, 2:31 PM

## 2019-10-01 NOTE — Progress Notes (Signed)
Recreation Therapy Notes  Date: 12.3.20 Time: 1000 Location: 500 Hall Dayroom  Group Topic: Goal Setting  Goal Area(s) Addresses:  Patient will be able to identify at least 3 life goals.  Patient will be able to identify benefit of investing in life goals.  Patient will be able to identify benefit of setting life goals.   Intervention: Worksheet  Activity: Lobbyist.  Patients were to identify goals they want to accomplish in a week, month, year and in 5 years.  Patients also identified what obstacles they would face, what they need to be successful and what they can start doing now to work towards their goals.  Education:  Discharge Planning, Coping Skills, Goal Planning   Education Outcome: Acknowledges Education/In Group Clarification Provided/Needs Additional Education  Clinical Observations:  Pt did not attend group.    Victorino Sparrow, LRT/CTRS     Victorino Sparrow A 10/01/2019 11:50 AM

## 2019-10-01 NOTE — Progress Notes (Signed)
Psychoeducational Group Note  Date:  10/01/2019 Time:  2048  Group Topic/Focus:  Wrap-Up Group:   The focus of this group is to help patients review their daily goal of treatment and discuss progress on daily workbooks.  Participation Level: Did Not Attend  Participation Quality:  Not Applicable  Affect:  Not Applicable  Cognitive:  Not Applicable  Insight:  Not Applicable  Engagement in Group: Not Applicable  Additional Comments:  The patient did not attend group this evening.   Archie Balboa S 10/01/2019, 8:48 PM
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
## 2019-10-01 NOTE — BHH Group Notes (Signed)
LCSW Group Therapy Notes 10/01/2019 2:14 PM  Type of Therapy and Topic: Group Therapy: Overcoming Obstacles  Participation Level: Active  Description of Group:  In this group patients will be encouraged to explore what they see as obstacles to their own wellness and recovery. They will be guided to discuss their thoughts, feelings, and behaviors related to these obstacles. The group will process together ways to cope with barriers, with attention given to specific choices patients can make. Each patient will be challenged to identify changes they are motivated to make in order to overcome their obstacles. This group will be process-oriented, with patients participating in exploration of their own experiences as well as giving and receiving support and challenge from other group members.  Therapeutic Goals: 1. Patient will identify personal and current obstacles as they relate to admission. 2. Patient will identify barriers that currently interfere with their wellness or overcoming obstacles.  3. Patient will identify feelings, thought process and behaviors related to these barriers. 4. Patient will identify two changes they are willing to make to overcome these obstacles:   Summary of Patient Progress Patient discussed the option of finding a new apartment or living situation. He is considering this option as treatment team and patient's supports outside of the hospital have suggested this. Patient shared that he feels resentment and anger at times when he thinks about moving out, and states that he has been a good neighbor and renter. Patient is agreeable to contact boarding houses and group homes to address this obstacle.  Therapeutic Modalities:  Cognitive Behavioral Therapy Solution Focused Therapy Motivational Interviewing Relapse Prevention Therapy  Stephanie Acre, MSW, [facility] 10/01/2019 2:14 PM

## 2019-10-01 NOTE — Progress Notes (Signed)
   10/01/19 2000  Psych Admission Type (Psych Patients Only)  Admission Status Voluntary  Psychosocial Assessment  Patient Complaints Anxiety  Eye Contact Brief  Facial Expression Angry  Affect Anxious;Labile;Preoccupied  Engineer, maintenance (IT);Assertive  Motor Activity Slow  Appearance/Hygiene In scrubs  Behavior Characteristics Agressive verbally  Mood Labile  Aggressive Behavior  Effect No apparent injury  Thought Process  Coherency Circumstantial  Content Blaming others;Preoccupation  Delusions None reported or observed  Perception WDL  Hallucination None reported or observed  Judgment Impaired  Confusion None  Danger to Self  Current suicidal ideation? Denies  Danger to Others  Danger to Others None reported or observed   Pt continues to be paranoid, irritable, but pt was less irritable this evening. Pt visible in the dayroom

## 2019-10-02 MED ORDER — PANTOPRAZOLE SODIUM 40 MG PO TBEC
40.0000 mg | DELAYED_RELEASE_TABLET | Freq: Two times a day (BID) | ORAL | Status: DC
  Administered 2019-10-02 – 2019-10-07 (×10): 40 mg via ORAL
  Filled 2019-10-02 (×18): qty 1

## 2019-10-02 NOTE — Progress Notes (Signed)
Patient was provided a list of boarding houses and rooms for rent in [place] and [place]. Patient stated he had no questions regarding the resource list and will follow up on his own.  Stephanie Acre, MSW, [place] Social Worker [facility]  [phone]

## 2019-10-02 NOTE — Progress Notes (Signed)
Patient denies SI, HI and AVH this shift. Patient has been less irritable and confrontational toward staff this shift.  Patient has been in his room resting for the majority of the shift.   Assess patient for safety, offer medications as prescribed engage patient in 1:1 staff talks.   Patient able to contract for safety. Continue to monitor as planned.

## 2019-10-02 NOTE — Progress Notes (Addendum)
10/02/2019  13:00-14:10   Spirituality group focused on topic of Hope.   Pt's engaged with one another in discussion of "HOPE IS," compiling a collective word cloud.  Engaged in visual explorer activity to connect to "hope for today."     Therapeutic Modalities: MI, Narrative  Pt Summary:  Invited, Did not attend.

## 2019-10-02 NOTE — Progress Notes (Signed)
Recreation Therapy Notes  Date: 12.4.20 Time: 1000 Location: 500 Hall Dayroom   Group Topic: Communication, Team Building, Problem Solving  Goal Area(s) Addresses:  Patient will effectively work with peer towards shared goal.  Patient will identify skill used to make activity successful.  Patient will identify how skills used during activity can be used to reach post d/c goals.   Behavioral Response: Engaged  Intervention: STEM Activity   Activity: [employer]. Patients were provided the following materials: 5 drinking straws, 5 rubber bands, 5 paper clips, 2 index cards and 2 drinking cups. Using the provided materials patients were asked to build a launching mechanisms to launch a ping pong ball approximately 12 feet. Patients were divided into teams of 3-5.   Education: Education officer, community, Dentist.   Education Outcome: Acknowledges education/In group clarification offered/Needs additional education.   Clinical Observations/Feedback: Pt was engaged and worked well with peers.  When discussing how the skills used in the activity could be used with support systems, pt stated his support system made him feel dumb and blame him for coming back into the hospital.  LRT encouraged pt to make a list of positive and negative qualities about his support system to determine if he needed to make changes to his support system or come up with a different approach for how they could work together.    Victorino Sparrow, LRT/CTRS    Ria Comment, Quantisha Marsicano A 10/02/2019 11:30 AM

## 2019-10-02 NOTE — Progress Notes (Signed)
[facility] MD Progress Note  10/02/2019 10:13 AM Albert [name]  MRN:  [id number] Subjective:   Albert [name] is well-known to the service this is the latest of numerous admissions he has a schizoaffective condition complicated by chronic poor compliance, chronic lack of insight into his illness and he remains delusionally focused on his landlord.  The bizarreness and sexual nature of these delusions, and their general treatment resistance led to this admission, despite long-acting injectable paliperidone  The patient continues to feel he is unsafe at home but states he is "looking at other places" and he has indeed excepted that it is best to move, although the delusions may follow him at least he will break the cycle of his current situation.  He denies current auditory or visual hallucinations, he asks legitimate questions about the potential side effects of his medication, and requests Prilosec.  He denies hearing and seeing things, he denies thoughts of harming self or others and understands what it is to contract.  Has been partially compliant here encouraged to take oral in addition to long-acting injectables No involuntary movements  Principal Problem: Schizoaffective disorder complicated by partial compliance and fixed delusions Diagnosis: Active Problems:   Schizophrenia (HCC)  Total Time spent with patient: 20 minutes  Past Psychiatric History: Multiple admissions this year  Past Medical History:  Past Medical History:  Diagnosis Date  . Alcoholism (HCC)   . Drug abuse (HCC)    xanax addiction 5 years ago  . Medical non-compliance   . Paranoid schizophrenia (HCC)   . Schizophrenia (HCC)   . Seizures (HCC)    History reviewed. No pertinent surgical history. Family History:  Family History  Problem Relation Age of Onset  . Thyroid disease Mother   . Depression Maternal Aunt    Family Psychiatric  History: No new data Social History:  Social History   Substance and Sexual  Activity  Alcohol Use Not Currently     Social History   Substance and Sexual Activity  Drug Use Not Currently  . Types: Marijuana   Comment: xanax- several years ago    Social History   Socioeconomic History  . Marital status: Single    Spouse name: Not on file  . Number of children: Not on file  . Years of education: Not on file  . Highest education level: Not on file  Occupational History    Comment: Unclear  Social Needs  . Financial resource strain: Not on file  . Food insecurity    Worry: Not on file    Inability: Not on file  . Transportation needs    Medical: Not on file    Non-medical: Not on file  Tobacco Use  . Smoking status: Never Smoker  . Smokeless tobacco: Never Used  . Tobacco comment: Non-smoker  Substance and Sexual Activity  . Alcohol use: Not Currently  . Drug use: Not Currently    Types: Marijuana    Comment: xanax- several years ago  . Sexual activity: Not Currently  Lifestyle  . Physical activity    Days per week: Not on file    Minutes per session: Not on file  . Stress: Not on file  Relationships  . Social Musician on phone: Not on file    Gets together: Not on file    Attends religious service: Not on file    Active member of club or organization: Not on file    Attends meetings of clubs or organizations: Not on file  Relationship status: Not on file  Other Topics Concern  . Not on file  Social History Narrative   ** Merged History Encounter **    Pt reported that he lives in a house apartment; he is followed by Dr. Reece Levy.  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   Additional Social History:                         Sleep: Good  Appetite:  Good  Current Medications: Current Facility-Administered Medications  Medication Dose Route Frequency Provider Last Rate Last Dose  . benztropine (COGENTIN) tablet 1 mg  1 mg Oral BID Rankin, Shuvon B, NP   Stopped  at 10/02/19 0800  . divalproex (DEPAKOTE) DR tablet 1,500 mg  1,500 mg Oral QHS Rankin, Shuvon B, NP   1,500 mg at 10/01/19 2108  . paliperidone (INVEGA) 24 hr tablet 9 mg  9 mg Oral Daily Johnn Hai, MD      . pantoprazole (PROTONIX) EC tablet 40 mg  40 mg Oral BID Johnn Hai, MD      . QUEtiapine (SEROQUEL) tablet 400 mg  400 mg Oral QHS Johnn Hai, MD   400 mg at 10/01/19 2108    Lab Results: No results found for this or any previous visit (from the past 46 hour(s)).  Blood Alcohol level:  Lab Results  Component Value Date   ETH <10 09/28/2019   ETH <10 72/53/6644    Metabolic Disorder Labs: Lab Results  Component Value Date   HGBA1C 5.1 01/05/2017   MPG 100 01/05/2017   Lab Results  Component Value Date   PROLACTIN 30.4 (H) 01/06/2017   PROLACTIN 13.1 01/05/2017   Lab Results  Component Value Date   CHOL 172 01/05/2017   TRIG 112 01/05/2017   HDL 41 01/05/2017   CHOLHDL 4.2 01/05/2017   VLDL 22 01/05/2017   LDLCALC 109 (H) 01/05/2017    Physical Findings: AIMS: Facial and Oral Movements Muscles of Facial Expression: None, normal Lips and Perioral Area: None, normal Jaw: None, normal Tongue: None, normal,Extremity Movements Upper (arms, wrists, hands, fingers): None, normal Lower (legs, knees, ankles, toes): None, normal, Trunk Movements Neck, shoulders, hips: None, normal, Overall Severity Severity of abnormal movements (highest score from questions above): None, normal Incapacitation due to abnormal movements: None, normal Patient's awareness of abnormal movements (rate only patient's report): No Awareness, Dental Status Current problems with teeth and/or dentures?: No Does patient usually wear dentures?: No  CIWA:  CIWA-Ar Total: 0 COWS:  COWS Total Score: 1  Musculoskeletal: Strength & Muscle Tone: within normal limits Gait & Station: normal Patient leans: N/A  Psychiatric Specialty Exam: Physical Exam  ROS  Blood pressure 124/63, pulse 85,  temperature 97.9 F (36.6 C), temperature source Oral, resp. rate 18, height 5\' 10"  (1.778 m), weight 109.8 kg.Body mass index is 34.72 kg/m.  General Appearance: Casual  Eye Contact:  Good  Speech:  Clear and Coherent  Volume:  Normal  Mood:  Euthymic  Affect:  Blunt  Thought Process:  Linear and Descriptions of Associations: Circumstantial  Orientation:  Full (Time, Place, and Person)  Thought Content:  Illogical, Delusions and Paranoid Ideation  Suicidal Thoughts:  No  Homicidal Thoughts:  No  Memory:  Immediate;   Fair Recent;   Fair Remote;   Fair  Judgement:  Fair  Insight:  poor  Psychomotor Activity:  Normal  Concentration:  Concentration: Fair  and Attention Span: Fair  Recall:  [employer]  Fund of Knowledge:  Fair  Language:  Fair  Akathisia:  Negative  Handed:  Right  AIMS (if indicated):     Assets:  Communication Skills Desire for Improvement  ADL's:  Intact  Cognition:  WNL  Sleep:  Number of Hours: 7     Treatment Plan Summary: Daily contact with patient to assess and evaluate symptoms and progress in treatment and Medication management  Continue oral and long-acting injectable antipsychotic therapy/continue mood stabilizer therapy all for the schizoaffective type condition Continue reality based therapy and continue to encourage mood from current situation Continue current precautions Requesting Prilosec that is been ordered Monitor through the weekend no change in other meds  Mehr Depaoli, MD 10/02/2019, 10:13 AM

## 2019-10-02 NOTE — Progress Notes (Signed)
   10/02/19 2100  Psych Admission Type (Psych Patients Only)  Admission Status Voluntary  Psychosocial Assessment  Patient Complaints Anxiety  Eye Contact Brief  Facial Expression Angry  Affect Anxious;Labile;Preoccupied  Engineer, maintenance (IT);Assertive  Motor Activity Slow  Appearance/Hygiene In scrubs  Mood Labile  Aggressive Behavior  Effect No apparent injury  Thought Process  Coherency Circumstantial  Content Blaming others;Preoccupation  Delusions None reported or observed  Perception WDL  Hallucination None reported or observed  Judgment Impaired  Confusion None  Danger to Self  Current suicidal ideation? Denies  Danger to Others  Danger to Others None reported or observed   Pt visible in the dayroom this evening. Pt less irritable this evening.

## 2019-10-03 NOTE — [facility] Group Notes (Signed)
[place] Group Notes: (Clinical Social Work)   10/03/2019      Type of Therapy:  Group Therapy   Participation Level:  Did Not Attend - was invited both individually by MHT and by overhead announcement, chose not to attend.   Selmer Dominion, LCSW 10/03/2019, 1:37 PM

## 2019-10-03 NOTE — Progress Notes (Signed)
[place] NOVEL CORONAVIRUS (COVID-19) DAILY CHECK-OFF SYMPTOMS - answer yes or no to each - every day NO YES  Have you had a fever in the past 24 hours?  . Fever (Temp > 37.80C / 100F) X   Have you had any of these symptoms in the past 24 hours? . New Cough .  Sore Throat  .  Shortness of Breath .  Difficulty Breathing .  Unexplained Body Aches   X   Have you had any one of these symptoms in the past 24 hours not related to allergies?   . Runny Nose .  Nasal Congestion .  Sneezing   X   If you have had runny nose, nasal congestion, sneezing in the past 24 hours, has it worsened?  X   EXPOSURES - check yes or no X   Have you traveled outside the state in the past 14 days?  X   Have you been in contact with someone with a confirmed diagnosis of COVID-19 or PUI in the past 14 days without wearing appropriate PPE?  X   Have you been living in the same home as a person with confirmed diagnosis of COVID-19 or a PUI (household contact)?    X   Have you been diagnosed with COVID-19?    X              What to do next: Answered NO to all: Answered YES to anything:   Proceed with unit schedule Follow the [facility] Inpatient Flowsheet.   

## 2019-10-03 NOTE — Progress Notes (Signed)
D. Pt visible in the dayroom interacting aqppropriately with peers. Per pt's self inventory, pt rated his depression, hopelessness and anxiety a 6/7/1, respectively.  Pt currently denies SI/HI and AVH  A. Labs and vitals monitored. Pt compliant with medications. Pt supported emotionally and encouraged to express concerns and ask questions.   R. Pt remains safe with 15 minute checks. Will continue POC.

## 2019-10-03 NOTE — Progress Notes (Signed)
[facility] MD Progress Note  10/03/2019 11:05 AM Albert [name]  MRN:  [id number] Subjective: Patient is a 43 year old male with a past psychiatric history significant for schizoaffective disorder who was admitted on 09/30/2019 with fixed delusional beliefs about his landlord and homicidal ideation towards him.  Objective: Patient is seen and examined.  Patient is a 43 year old male with the above-stated past psychiatric history seen in follow-up.  Unfortunately I am familiar with the patient from previous admissions.  His last psychiatric hospitalization was recent here, and had been last admitted on 09/02/2019.  He has a longstanding history of noncompliance with medications.  He has had long-acting injectable medications, but those have not been effective.  He is asleep this morning, and wants to know the side effects of Invega.  I told him that basically they were the same as Seroquel.  He has always requested Seroquel in the past because "Dr. Reece Levy told me to take it".  He remains very argumentative and irritable about compliance with medications.  His current medications include Depakote, paliperidone 9 mg p.o. daily and Seroquel 400 mg p.o. nightly.  His Depakote dose is 1500 mg at bedtime.  His vital signs are stable, he is afebrile.  He slept 6.75 hours last night.  Review of his laboratories on admission revealed essentially normal electrolytes, normal CBC, a Depakote level of 20, negative drug screen and negative blood alcohol.  His EKG was not a good reading, but showed sinus rhythm with a normal QTC.  Principal Problem: <principal problem not specified> Diagnosis: Active Problems:   Schizophrenia ([place])  Total Time spent with patient: 15 minutes  Past Psychiatric History: See admission H&P  Past Medical History:  Past Medical History:  Diagnosis Date  . Alcoholism ([place])   . Drug abuse ([place])    xanax addiction 5 years ago  . Medical non-compliance   . Paranoid schizophrenia ([place])   .  Schizophrenia ([place])   . Seizures ([place])    History reviewed. No pertinent surgical history. Family History:  Family History  Problem Relation Age of Onset  . Thyroid disease Mother   . Depression Maternal Aunt    Family Psychiatric  History: See admission H&P Social History:  Social History   Substance and Sexual Activity  Alcohol Use Not Currently     Social History   Substance and Sexual Activity  Drug Use Not Currently  . Types: Marijuana   Comment: xanax- several years ago    Social History   Socioeconomic History  . Marital status: Single    Spouse name: Not on file  . Number of children: Not on file  . Years of education: Not on file  . Highest education level: Not on file  Occupational History    Comment: Unclear  Social Needs  . Financial resource strain: Not on file  . Food insecurity    Worry: Not on file    Inability: Not on file  . Transportation needs    Medical: Not on file    Non-medical: Not on file  Tobacco Use  . Smoking status: Never Smoker  . Smokeless tobacco: Never Used  . Tobacco comment: Non-smoker  Substance and Sexual Activity  . Alcohol use: Not Currently  . Drug use: Not Currently    Types: Marijuana    Comment: xanax- several years ago  . Sexual activity: Not Currently  Lifestyle  . Physical activity    Days per week: Not on file    Minutes per session: Not on file  .  Stress: Not on file  Relationships  . Social Musician on phone: Not on file    Gets together: Not on file    Attends religious service: Not on file    Active member of club or organization: Not on file    Attends meetings of clubs or organizations: Not on file    Relationship status: Not on file  Other Topics Concern  . Not on file  Social History Narrative   ** Merged History Encounter **    Pt reported that he lives in a house apartment; he is followed by Dr. Betti Cruz.  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.   Additional Social History:                         Sleep: Good  Appetite:  Good  Current Medications: Current Facility-Administered Medications  Medication Dose Route Frequency Provider Last Rate Last Dose  . benztropine (COGENTIN) tablet 1 mg  1 mg Oral BID Rankin, Shuvon B, NP   Stopped at 10/02/19 0800  . divalproex (DEPAKOTE) DR tablet 1,500 mg  1,500 mg Oral QHS Rankin, Shuvon B, NP   1,500 mg at 10/02/19 2054  . paliperidone (INVEGA) 24 hr tablet 9 mg  9 mg Oral Daily Malvin Johns, MD      . pantoprazole (PROTONIX) EC tablet 40 mg  40 mg Oral BID Malvin Johns, MD   40 mg at 10/03/19 0809  . QUEtiapine (SEROQUEL) tablet 400 mg  400 mg Oral QHS Malvin Johns, MD   400 mg at 10/02/19 2054    Lab Results: No results found for this or any previous visit (from the past 48 hour(s)).  Blood Alcohol level:  Lab Results  Component Value Date   ETH <10 09/28/2019   ETH <10 09/01/2019    Metabolic Disorder Labs: Lab Results  Component Value Date   HGBA1C 5.1 01/05/2017   MPG 100 01/05/2017   Lab Results  Component Value Date   PROLACTIN 30.4 (H) 01/06/2017   PROLACTIN 13.1 01/05/2017   Lab Results  Component Value Date   CHOL 172 01/05/2017   TRIG 112 01/05/2017   HDL 41 01/05/2017   CHOLHDL 4.2 01/05/2017   VLDL 22 01/05/2017   LDLCALC 109 (H) 01/05/2017    Physical Findings: AIMS: Facial and Oral Movements Muscles of Facial Expression: None, normal Lips and Perioral Area: None, normal Jaw: None, normal Tongue: None, normal,Extremity Movements Upper (arms, wrists, hands, fingers): None, normal Lower (legs, knees, ankles, toes): None, normal, Trunk Movements Neck, shoulders, hips: None, normal, Overall Severity Severity of abnormal movements (highest score from questions above): None, normal Incapacitation due to abnormal movements: None, normal Patient's awareness of abnormal movements (rate only patient's report): No Awareness, Dental  Status Current problems with teeth and/or dentures?: No Does patient usually wear dentures?: No  CIWA:  CIWA-Ar Total: 0 COWS:  COWS Total Score: 1  Musculoskeletal: Strength & Muscle Tone: within normal limits Gait & Station: normal Patient leans: N/A  Psychiatric Specialty Exam: Physical Exam  Nursing note and vitals reviewed. Constitutional: He is oriented to person, place, and time. He appears well-developed and well-nourished.  HENT:  Head: Normocephalic and atraumatic.  Respiratory: Effort normal.  Neurological: He is alert and oriented to person, place, and time.    ROS  Blood pressure 120/64, pulse 63, temperature 97.9 F (36.6 C), temperature source Oral, resp. rate  18, height 5\' 10"  (1.778 m), weight 109.8 kg.Body mass index is 34.72 kg/m.  General Appearance: Disheveled  Eye Contact:  Fair  Speech:  Normal Rate  Volume:  Normal  Mood:  Irritable  Affect:  Congruent  Thought Process:  Coherent and Descriptions of Associations: Circumstantial  Orientation:  Full (Time, Place, and Person)  Thought Content:  Delusions  Suicidal Thoughts:  No  Homicidal Thoughts:  No  Memory:  Immediate;   Fair Recent;   Fair Remote;   Fair  Judgement:  Impaired  Insight:  Lacking  Psychomotor Activity:  Normal  Concentration:  Concentration: Fair and Attention Span: Fair  Recall:  [employer]Fair  Fund of Knowledge:  Fair  Language:  Good  Akathisia:  Negative  Handed:  Right  AIMS (if indicated):     Assets:  Desire for Improvement Resilience  ADL's:  Intact  Cognition:  WNL  Sleep:  Number of Hours: 6.75     Treatment Plan Summary: Daily contact with patient to assess and evaluate symptoms and progress in treatment, Medication management and Plan : Patient is seen and examined.  Patient is a 43 year old male with the above-stated past psychiatric history who is seen in follow-up.   Diagnosis: #1 schizoaffective disorder; bipolar type  Patient is seen in follow-up.  He is  essentially unchanged from previous examinations in the past.  I will write for Depakote level, CBC and LFTs in the a.m. tomorrow.  I am not going to change his medications at this point.  If he continues to sleep during the day we may have to reduce some of the medication burden. 1.  Continue Cogentin 1 mg p.o. twice daily for side effects of medication. 2.  Continue Depakote DR 1500 mg p.o. nightly for mood stability. 3.  Continue paliperidone 9 mg p.o. daily for psychosis, but may consider switching to nightly. 4.  Continue Protonix 40 mg p.o. twice daily for GERD. 5.  Continue Seroquel 400 mg p.o. nightly. 6.  Depakote level, CBC with differential and liver function enzymes in a.m. tomorrow. 7.  Disposition planning-in progress.  Antonieta PertGreg Lawson , MD 10/03/2019, 11:05 AM

## 2019-10-03 NOTE — Progress Notes (Signed)
Adult Psychoeducational Group Note  Date:  10/03/2019 Time:  3:58 AM  Group Topic/Focus:  Wrap-Up Group:   The focus of this group is to help patients review their daily goal of treatment and discuss progress on daily workbooks.  Participation Level:  Active  Participation Quality:  Appropriate  Affect:  Appropriate  Cognitive:  Appropriate  Insight: Appropriate  Engagement in Group:  Developing/Improving  Modes of Intervention:  Discussion  Additional Comments: Pt stated his goal for today was to focus on his treatment plan and talk with his Social Worker about his after-care plan. Pt stated he felt he accomplished both goals today. Pt stated his relationship with his family has improved since he was admitted.  Pt stated been able to contact his mother today improved his day. Pt stated she felt better today about himself. Pt rated his overall day a 7 out of 10. Pt stated his appetite was improving today. Pt stated his goal for tonight was to get some rest. Pt stated he was in no physical pain today. Pt stated he was not hearing or seeing anything that was not there. Pt stated he had no thoughts of harming himself or others today. Pt stated if anything change he would alert staff.   Albert [name] 10/03/2019, 3:58 AM

## 2019-10-04 NOTE — Progress Notes (Signed)
Adult Psychoeducational Group Note  Date:  10/04/2019 Time:  12:45 AM  Group Topic/Focus:  Wrap-Up Group:   The focus of this group is to help patients review their daily goal of treatment and discuss progress on daily workbooks.  Participation Level:  Active  Participation Quality:  Appropriate  Affect:  Appropriate  Cognitive:  Appropriate  Insight: Appropriate  Engagement in Group:  Developing/Improving  Modes of Intervention:  Discussion  Additional Comments: Pt stated his goal for today was to focus on his treatment plan and talk with his doctor about his treatment plan. Pt stated he felt he accomplished both goals today. Pt stated his relationship with his family has improved since he was admitted.  Pt rated his overall day a 6 out of 10. Pt stated his appetite was improving today. Pt stated his goal for tonight was to get some rest. Pt stated he was in no physical pain today. Pt stated he was not hearing or seeing anything that was not there. Pt stated he had no thoughts of harming himself or others today. Pt stated if anything change he would alert staff.   Albert [name] 10/04/2019, 12:45 AM

## 2019-10-04 NOTE — Progress Notes (Signed)
   10/03/19 2120  Psych Admission Type (Psych Patients Only)  Admission Status Voluntary  Psychosocial Assessment  Patient Complaints Depression  Eye Contact Brief  Facial Expression Anxious  Affect Anxious;Depressed  Speech Tangential  Interaction Assertive  Motor Activity Slow  Appearance/Hygiene In scrubs  Behavior Characteristics Cooperative  Mood Labile  Aggressive Behavior  Effect No apparent injury  Thought Process  Coherency WDL  Content Preoccupation  Delusions None reported or observed  Perception WDL  Hallucination None reported or observed  Judgment Impaired  Confusion None  Danger to Self  Current suicidal ideation? Denies  Danger to Others  Danger to Others None reported or observed

## 2019-10-04 NOTE — Progress Notes (Signed)
[place] Group Notes:  (Nursing/MHT/Case Management/Adjunct)  Date:  10/04/2019  Time:  0900 am  Type of Therapy:  Nurse Education  Participation Level:  Did Not Attend   Marissa Calamity

## 2019-10-04 NOTE — Progress Notes (Signed)
Patient has been up in the dayroom watching tv. He attended group and was compliant with his scheduled medications. Safety maintained with 15 min checks.

## 2019-10-04 NOTE — Progress Notes (Signed)
D. Pt has been calm and cooperative on the unit- somewhat isolative to room - did not want to attend groups despite encouragement. Per pt's self inventory, pt rated his depression, hopelessness and anxiety a 5/6/1, respectively. . Pt currently denies SI/HI and AVH  A. Labs and vitals monitored. Pt compliant with medications. Pt supported emotionally and encouraged to express concerns and ask questions.   R. Pt remains safe with 15 minute checks. Will continue POC.

## 2019-10-04 NOTE — [facility] Group Notes (Signed)
[facility] Group Notes: (Clinical Social Work)   10/04/2019      Type of Therapy:  Group Therapy   Participation Level:  Did Not Attend - was invited both individually by MHT and by overhead announcement, chose not to attend.   Fallynn Gravett Grossman-Orr, LCSW 10/04/2019, 12:04 PM     

## 2019-10-04 NOTE — Progress Notes (Signed)
[facility] MD Progress Note  10/04/2019 10:34 AM Albert [name]  MRN:  [id number] Subjective:  Patient is a 43 year old male with a past psychiatric history significant for schizoaffective disorder who was admitted on 09/30/2019 with fixed delusional beliefs about his landlord and homicidal ideation towards him.  Objective: Patient is seen and examined.  Patient is a 43 year old male with the above-stated past psychiatric history is seen in follow-up.  He stated he took his oral paliperidone last night.  He stated he slept well.  He stated he is looking forward to getting out of the hospital, and rectifying some of the issues with his landlord.  Previous discussion with social work led me to believe that he is going to move to another apartment, but it is suspected that his paranoia towards whomever will be there as well.  He denied any auditory or visual hallucinations.  He denied any suicidal or homicidal ideation.  He still remains paranoid.  His vital signs are stable, he is afebrile.  He slept 6.25 hours last night.  Principal Problem: <principal problem not specified> Diagnosis: Active Problems:   Schizophrenia ([place])  Total Time spent with patient: 15 minutes  Past Psychiatric History: See admission H&P  Past Medical History:  Past Medical History:  Diagnosis Date  . Alcoholism ([place])   . Drug abuse ([place])    xanax addiction 5 years ago  . Medical non-compliance   . Paranoid schizophrenia ([place])   . Schizophrenia ([place])   . Seizures ([place])    History reviewed. No pertinent surgical history. Family History:  Family History  Problem Relation Age of Onset  . Thyroid disease Mother   . Depression Maternal Aunt    Family Psychiatric  History: See admission H&P Social History:  Social History   Substance and Sexual Activity  Alcohol Use Not Currently     Social History   Substance and Sexual Activity  Drug Use Not Currently  . Types: Marijuana   Comment: xanax- several years ago     Social History   Socioeconomic History  . Marital status: Single    Spouse name: Not on file  . Number of children: Not on file  . Years of education: Not on file  . Highest education level: Not on file  Occupational History    Comment: Unclear  Social Needs  . Financial resource strain: Not on file  . Food insecurity    Worry: Not on file    Inability: Not on file  . Transportation needs    Medical: Not on file    Non-medical: Not on file  Tobacco Use  . Smoking status: Never Smoker  . Smokeless tobacco: Never Used  . Tobacco comment: Non-smoker  Substance and Sexual Activity  . Alcohol use: Not Currently  . Drug use: Not Currently    Types: Marijuana    Comment: xanax- several years ago  . Sexual activity: Not Currently  Lifestyle  . Physical activity    Days per week: Not on file    Minutes per session: Not on file  . Stress: Not on file  Relationships  . Social Herbalist on phone: Not on file    Gets together: Not on file    Attends religious service: Not on file    Active member of club or organization: Not on file    Attends meetings of clubs or organizations: Not on file    Relationship status: Not on file  Other Topics Concern  . Not  on file  Social History Narrative   ** Merged History Encounter **    Pt reported that he lives in a house apartment; he is followed by Dr. Betti Cruz.  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   Additional Social History:                         Sleep: Good  Appetite:  Good  Current Medications: Current Facility-Administered Medications  Medication Dose Route Frequency Provider Last Rate Last Dose  . benztropine (COGENTIN) tablet 1 mg  1 mg Oral BID Rankin, Shuvon B, NP   1 mg at 10/04/19 0802  . divalproex (DEPAKOTE) DR tablet 1,500 mg  1,500 mg Oral QHS Rankin, Shuvon B, NP   1,500 mg at 10/03/19 2112  . paliperidone (INVEGA) 24 hr tablet 9 mg  9  mg Oral Daily Malvin Johns, MD   9 mg at 10/04/19 0801  . pantoprazole (PROTONIX) EC tablet 40 mg  40 mg Oral BID Malvin Johns, MD   40 mg at 10/04/19 0801  . QUEtiapine (SEROQUEL) tablet 400 mg  400 mg Oral QHS Malvin Johns, MD   400 mg at 10/03/19 2112    Lab Results: No results found for this or any previous visit (from the past 48 hour(s)).  Blood Alcohol level:  Lab Results  Component Value Date   ETH <10 09/28/2019   ETH <10 09/01/2019    Metabolic Disorder Labs: Lab Results  Component Value Date   HGBA1C 5.1 01/05/2017   MPG 100 01/05/2017   Lab Results  Component Value Date   PROLACTIN 30.4 (H) 01/06/2017   PROLACTIN 13.1 01/05/2017   Lab Results  Component Value Date   CHOL 172 01/05/2017   TRIG 112 01/05/2017   HDL 41 01/05/2017   CHOLHDL 4.2 01/05/2017   VLDL 22 01/05/2017   LDLCALC 109 (H) 01/05/2017    Physical Findings: AIMS: Facial and Oral Movements Muscles of Facial Expression: None, normal Lips and Perioral Area: None, normal Jaw: None, normal Tongue: None, normal,Extremity Movements Upper (arms, wrists, hands, fingers): None, normal Lower (legs, knees, ankles, toes): None, normal, Trunk Movements Neck, shoulders, hips: None, normal, Overall Severity Severity of abnormal movements (highest score from questions above): None, normal Incapacitation due to abnormal movements: None, normal Patient's awareness of abnormal movements (rate only patient's report): No Awareness, Dental Status Current problems with teeth and/or dentures?: No Does patient usually wear dentures?: No  CIWA:  CIWA-Ar Total: 0 COWS:  COWS Total Score: 1  Musculoskeletal: Strength & Muscle Tone: within normal limits Gait & Station: normal Patient leans: N/A  Psychiatric Specialty Exam: Physical Exam  Nursing note and vitals reviewed. Constitutional: He is oriented to person, place, and time. He appears well-developed and well-nourished.  HENT:  Head: Normocephalic and  atraumatic.  Respiratory: Effort normal.  Neurological: He is alert and oriented to person, place, and time.    ROS  Blood pressure 120/64, pulse 63, temperature 97.9 F (36.6 C), temperature source Oral, resp. rate 18, height 5\' 10"  (1.778 m), weight 109.8 kg.Body mass index is 34.72 kg/m.  General Appearance: Disheveled  Eye Contact:  Minimal  Speech:  Normal Rate  Volume:  Normal  Mood:  Dysphoric and Irritable  Affect:  Constricted  Thought Process:  Coherent and Descriptions of Associations: Loose  Orientation:  Full (Time, Place, and Person)  Thought Content:  Paranoid Ideation  Suicidal Thoughts:  No  Homicidal Thoughts:  No  Memory:  Immediate;   Fair Recent;   Fair Remote;   Fair  Judgement:  Impaired  Insight:  Lacking  Psychomotor Activity:  Normal  Concentration:  Concentration: Fair and Attention Span: Fair  Recall:  [employer]Fair  Fund of Knowledge:  Fair  Language:  Good  Akathisia:  Negative  Handed:  Right  AIMS (if indicated):     Assets:  Desire for Improvement Resilience  ADL's:  Intact  Cognition:  WNL  Sleep:  Number of Hours: 6.25     Treatment Plan Summary: Daily contact with patient to assess and evaluate symptoms and progress in treatment, Medication management and Plan : Patient is seen and examined.  Patient is a 43 year old male with the above-stated past psychiatric history is seen in follow-up.   Diagnosis: #1 schizoaffective disorder; bipolar type  Patient is seen in follow-up.  He is essentially unchanged.  He did take the oral paliperidone.  No changes medications today.  I had written for a CBC with differential, Depakote level and liver function enzymes for this a.m., but none of those results are back yet.  Hopefully they will be available soon.  1.  Continue Cogentin 1 mg p.o. twice daily for side effects of medication. 2.  Continue Depakote DR 1500 mg p.o. nightly for mood stability. 3.  Continue paliperidone 9 mg p.o. daily for  psychosis, but may consider switching to nightly. 4.  Continue Protonix 40 mg p.o. twice daily for GERD. 5.  Continue Seroquel 400 mg p.o. nightly. 6.  Depakote level, CBC with differential and liver function enzymes scheduled for this AM. 7.  Disposition planning-in progress.  Antonieta PertGreg Lawson [place], MD 10/04/2019, 10:34 AM

## 2019-10-04 NOTE — Progress Notes (Signed)
[place] NOVEL CORONAVIRUS (COVID-19) DAILY CHECK-OFF SYMPTOMS - answer yes or no to each - every day NO YES  Have you had a fever in the past 24 hours?  . Fever (Temp > 37.80C / 100F) X   Have you had any of these symptoms in the past 24 hours? . New Cough .  Sore Throat  .  Shortness of Breath .  Difficulty Breathing .  Unexplained Body Aches   X   Have you had any one of these symptoms in the past 24 hours not related to allergies?   . Runny Nose .  Nasal Congestion .  Sneezing   X   If you have had runny nose, nasal congestion, sneezing in the past 24 hours, has it worsened?  X   EXPOSURES - check yes or no X   Have you traveled outside the state in the past 14 days?  X   Have you been in contact with someone with a confirmed diagnosis of COVID-19 or PUI in the past 14 days without wearing appropriate PPE?  X   Have you been living in the same home as a person with confirmed diagnosis of COVID-19 or a PUI (household contact)?    X   Have you been diagnosed with COVID-19?    X              What to do next: Answered NO to all: Answered YES to anything:   Proceed with unit schedule Follow the [facility] Inpatient Flowsheet.   

## 2019-10-05 NOTE — Progress Notes (Addendum)
   10/05/19 2200  Psych Admission Type (Psych Patients Only)  Admission Status Voluntary  Psychosocial Assessment  Patient Complaints None  Eye Contact Brief  Facial Expression Flat  Affect Blunted  Speech Tangential  Interaction Assertive  Motor Activity Slow  Appearance/Hygiene In scrubs  Behavior Characteristics Appropriate to situation  Mood Depressed  Aggressive Behavior  Effect No apparent injury  Thought Process  Coherency WDL  Content Preoccupation  Delusions None reported or observed  Perception WDL  Hallucination None reported or observed  Judgment Impaired  Confusion None  Danger to Self  Current suicidal ideation? Denies  Danger to Others  Danger to Others None reported or observed   Pt stated he felt about the same today.

## 2019-10-05 NOTE — Progress Notes (Addendum)
Patient was asked several times to get up for his vitals to be taken and did not. He came up to nursing station arguing with writer about why his breakfast could not be brought back. He reported that his foot was hurting and Probation officer informed him that he would need to walk down and get his breakfast like everyone else. Patient continued to argue and refused his vitals to be taken. He was upset but went down for his breakfast. Patient also refused labs.  @ [phone] pt came up to nursing station to say that he was ready for his labs after he finished eating his breakfast. Patient has been inpatient before and when he does not want to do something he develops an attitude with staff and will later come back as if nothing has happened.

## 2019-10-05 NOTE — Progress Notes (Signed)
Recreation Therapy Notes  Date: 12.7.20 Time: 1000 Location: 500 Hall Dayroom  Group Topic: Coping Skills  Goal Area(s) Addresses:  Pt will identify healthy and unhealthy coping strategies. Pt will identify barriers to using healthy coping strategies. Pt will identify benefit of using healthy coping strategies post d/c.  Behavioral Response: Minimal  Intervention:  Worksheet  Activity: Healthy vs. Unhealthy Coping Strategies.  Patients were to identify a problem they are dealing with.  Patients would then identify the unhealthy coping strategies that have been used to deal with the problem and the consequences of it.  Patients then identified healthy coping strategies, expected outcomes and barriers to using healthy coping strategies.  Education: Radiographer, therapeutic, Dentist.   Education Outcome: Acknowledges understanding/In group clarification offered/Needs additional education.   Clinical Observations/Feedback: Pt identified current problem as "roommates upstairs".  Pt did not identify any healthy or unhealthy coping strategies.    Victorino Sparrow, LRT/CTRS     Ria Comment, [place] Scorsone A 10/05/2019 1:00 PM

## 2019-10-05 NOTE — [facility] Group Notes (Signed)
LCSW Group Therapy Notes 10/05/2019 3:16 PM  Type of Therapy and Topic: Group Therapy: Overcoming Obstacles  Participation Level: Active  Description of Group:  In this group patients will be encouraged to explore what they see as obstacles to their own wellness and recovery. They will be guided to discuss their thoughts, feelings, and behaviors related to these obstacles. The group will process together ways to cope with barriers, with attention given to specific choices patients can make. Each patient will be challenged to identify changes they are motivated to make in order to overcome their obstacles. This group will be process-oriented, with patients participating in exploration of their own experiences as well as giving and receiving support and challenge from other group members.  Therapeutic Goals: 1. Patient will identify personal and current obstacles as they relate to admission. 2. Patient will identify barriers that currently interfere with their wellness or overcoming obstacles.  3. Patient will identify feelings, thought process and behaviors related to these barriers. 4. Patient will identify two changes they are willing to make to overcome these obstacles:   Summary of Patient Progress Albert [name] shared his obstacle prior to admission was his living situation and conflict with housemates. In the long term, patient plans to move somewhere else and get help from his social supports.   Therapeutic Modalities:  Cognitive Behavioral Therapy Solution Focused Therapy Motivational Interviewing Relapse Prevention Therapy  Stephanie Acre, MSW, [facility] 10/05/2019 3:16 PM

## 2019-10-05 NOTE — Progress Notes (Signed)
[facility] MD Progress Note  10/05/2019 8:17 AM Albert [name]  MRN:  [id number] Subjective:   Patient had an uneventful weekend he is compliant fully with his meds today he denies thoughts of harming self or others still remains delusional he focused however and we believe this is a fixed delusion regarding landlord he states he is looking for a new place to stay  No EPS or TD  No thoughts of harming self or others  Principal Problem: Bipolar/schizoaffective/fixed delusions Diagnosis: Active Problems:   Schizophrenia ([facility])  Total Time spent with patient: 20 minutes  Past Psychiatric History: Multiple admissions even this year  Past Medical History:  Past Medical History:  Diagnosis Date  . Alcoholism ([facility])   . Drug abuse ([facility])    xanax addiction 5 years ago  . Medical non-compliance   . Paranoid schizophrenia ([facility])   . Schizophrenia ([facility])   . Seizures ([facility])    History reviewed. No pertinent surgical history. Family History:  Family History  Problem Relation Age of Onset  . Thyroid disease Mother   . Depression Maternal Aunt    Family Psychiatric  History: No new data Social History:  Social History   Substance and Sexual Activity  Alcohol Use Not Currently     Social History   Substance and Sexual Activity  Drug Use Not Currently  . Types: Marijuana   Comment: xanax- several years ago    Social History   Socioeconomic History  . Marital status: Single    Spouse name: Not on file  . Number of children: Not on file  . Years of education: Not on file  . Highest education level: Not on file  Occupational History    Comment: Unclear  Social Needs  . Financial resource strain: Not on file  . Food insecurity    Worry: Not on file    Inability: Not on file  . Transportation needs    Medical: Not on file    Non-medical: Not on file  Tobacco Use  . Smoking status: Never Smoker  . Smokeless tobacco: Never Used  . Tobacco comment: Non-smoker  Substance and  Sexual Activity  . Alcohol use: Not Currently  . Drug use: Not Currently    Types: Marijuana    Comment: xanax- several years ago  . Sexual activity: Not Currently  Lifestyle  . Physical activity    Days per week: Not on file    Minutes per session: Not on file  . Stress: Not on file  Relationships  . Social Musician on phone: Not on file    Gets together: Not on file    Attends religious service: Not on file    Active member of club or organization: Not on file    Attends meetings of clubs or organizations: Not on file    Relationship status: Not on file  Other Topics Concern  . Not on file  Social History Narrative   ** Merged History Encounter **    Pt reported that he lives in a house apartment; he is followed by Dr. Betti Cruz.  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   Additional Social History:                         Sleep: Good  Appetite:  Good  Current Medications: Current Facility-Administered Medications  Medication Dose Route Frequency Provider Last Rate Last Dose  .  benztropine (COGENTIN) tablet 1 mg  1 mg Oral BID Rankin, Shuvon B, NP   1 mg at 10/05/19 0755  . divalproex (DEPAKOTE) DR tablet 1,500 mg  1,500 mg Oral QHS Rankin, Shuvon B, NP   1,500 mg at 10/04/19 2050  . paliperidone (INVEGA) 24 hr tablet 9 mg  9 mg Oral Daily Johnn Hai, MD   9 mg at 10/05/19 0755  . pantoprazole (PROTONIX) EC tablet 40 mg  40 mg Oral BID Johnn Hai, MD   40 mg at 10/05/19 0755  . QUEtiapine (SEROQUEL) tablet 400 mg  400 mg Oral QHS Johnn Hai, MD   400 mg at 10/04/19 2050    Lab Results: No results found for this or any previous visit (from the past 48 hour(s)).  Blood Alcohol level:  Lab Results  Component Value Date   ETH <10 09/28/2019   ETH <10 49/70/2637    Metabolic Disorder Labs: Lab Results  Component Value Date   HGBA1C 5.1 01/05/2017   MPG 100 01/05/2017   Lab Results   Component Value Date   PROLACTIN 30.4 (H) 01/06/2017   PROLACTIN 13.1 01/05/2017   Lab Results  Component Value Date   CHOL 172 01/05/2017   TRIG 112 01/05/2017   HDL 41 01/05/2017   CHOLHDL 4.2 01/05/2017   VLDL 22 01/05/2017   LDLCALC 109 (H) 01/05/2017    Physical Findings: AIMS: Facial and Oral Movements Muscles of Facial Expression: None, normal Lips and Perioral Area: None, normal Jaw: None, normal Tongue: None, normal,Extremity Movements Upper (arms, wrists, hands, fingers): None, normal Lower (legs, knees, ankles, toes): None, normal, Trunk Movements Neck, shoulders, hips: None, normal, Overall Severity Severity of abnormal movements (highest score from questions above): None, normal Incapacitation due to abnormal movements: None, normal Patient's awareness of abnormal movements (rate only patient's report): No Awareness, Dental Status Current problems with teeth and/or dentures?: No Does patient usually wear dentures?: No  CIWA:  CIWA-Ar Total: 0 COWS:  COWS Total Score: 1  Musculoskeletal: Strength & Muscle Tone: within normal limits Gait & Station: normal Patient leans: N/A  Psychiatric Specialty Exam: Physical Exam  ROS  Blood pressure 120/64, pulse 63, temperature 97.9 F (36.6 C), temperature source Oral, resp. rate 18, height 5\' 10"  (1.778 m), weight 109.8 kg.Body mass index is 34.72 kg/m.  General Appearance: Casual  Eye Contact:  Good  Speech:  Clear and Coherent  Volume:  Normal  Mood:  Euthymic  Affect:  Congruent  Thought Process:  Linear  Orientation:  Full (Time, Place, and Person)  Thought Content:  Illogical and Delusions  Suicidal Thoughts:  No  Homicidal Thoughts:  No  Memory:  Immediate;   Fair Recent;   Fair Remote;   Fair  Judgement:  Fair  Insight:  Shallow  Psychomotor Activity:  Normal  Concentration:  Concentration: Fair and Attention Span: Fair  Recall:  [employer] of Knowledge:  Fair  Language:  Fair  Akathisia:   Negative  Handed:  Right  AIMS (if indicated):     Assets:  Leisure Time Physical Health Resilience  ADL's:  Intact  Cognition:  WNL  Sleep:  Number of Hours: 6.75     Treatment Plan Summary: Daily contact with patient to assess and evaluate symptoms and progress in treatment and Medication management  Continue current precautions no change in medications continue reality based therapy probable discharge at the end of the week  Desean Heemstra, MD 10/05/2019, 8:17 AM

## 2019-10-05 NOTE — Progress Notes (Signed)
Patient denies SI, HI and AVH this shift.  Patient has been less irritable this shift.  Patient has been compliant with medications, attended groups and engage patient in 1:1 staff talks.   Assess patient for safety, offer medications as prescribed, engage patient in 1:1 staff talks.   Continue to monitor as planned. Patient able to contract for safety. d

## 2019-10-05 NOTE — Tx Team (Signed)
Interdisciplinary Treatment and Diagnostic Plan Update  10/05/2019 Time of Session: 10:00am Albert [name] MRN: [id number]  Principal Diagnosis: <principal problem not specified>  Secondary Diagnoses: Active Problems:   Schizophrenia ([place])   Current Medications:  Current Facility-Administered Medications  Medication Dose Route Frequency Provider Last Rate Last Dose  . benztropine (COGENTIN) tablet 1 mg  1 mg Oral BID Rankin, Shuvon B, NP   1 mg at 10/05/19 0755  . divalproex (DEPAKOTE) DR tablet 1,500 mg  1,500 mg Oral QHS Rankin, Shuvon B, NP   1,500 mg at 10/04/19 2050  . paliperidone (INVEGA) 24 hr tablet 9 mg  9 mg Oral Daily Johnn Hai, MD   9 mg at 10/05/19 0755  . pantoprazole (PROTONIX) EC tablet 40 mg  40 mg Oral BID Johnn Hai, MD   40 mg at 10/05/19 0755  . QUEtiapine (SEROQUEL) tablet 400 mg  400 mg Oral QHS Johnn Hai, MD   400 mg at 10/04/19 2050   PTA Medications: Medications Prior to Admission  Medication Sig Dispense Refill Last Dose  . benztropine (COGENTIN) 1 MG tablet Take 1 tablet (1 mg total) by mouth 2 (two) times daily. 60 tablet 3   . divalproex (DEPAKOTE) 500 MG DR tablet Take 3 tablets (1,500 mg total) by mouth at bedtime. 90 tablet 2   . omeprazole (PRILOSEC) 40 MG capsule Take 40 mg by mouth daily as needed (heart burn).     . paliperidone (INVEGA SUSTENNA) 234 MG/1.5ML SUSY injection Inject 234 mg into the muscle every 30 (thirty) days. Due 12/10 1.5 mL 0   . paliperidone (INVEGA) 6 MG 24 hr tablet Take 1 tablet (6 mg total) by mouth daily for 14 days. 14 tablet 0   . QUEtiapine (SEROQUEL) 200 MG tablet Take 1 tablet (200 mg total) by mouth at bedtime. 90 tablet 1     Patient Stressors: Marital or family conflict Medication change or noncompliance  Patient Strengths: Capable of independent living General fund of knowledge  Treatment Modalities: Medication Management, Group therapy, Case management,  1 to 1 session with clinician,  Psychoeducation, Recreational therapy.   Physician Treatment Plan for Primary Diagnosis: <principal problem not specified> Long Term Goal(s): Improvement in symptoms so as ready for discharge Improvement in symptoms so as ready for discharge   Short Term Goals: Ability to verbalize feelings will improve Ability to disclose and discuss suicidal ideas Ability to identify and develop effective coping behaviors will improve Ability to identify and develop effective coping behaviors will improve Ability to maintain clinical measurements within normal limits will improve Compliance with prescribed medications will improve Ability to identify triggers associated with substance abuse/mental health issues will improve  Medication Management: Evaluate patient's response, side effects, and tolerance of medication regimen.  Therapeutic Interventions: 1 to 1 sessions, Unit Group sessions and Medication administration.  Evaluation of Outcomes: Progressing  Physician Treatment Plan for Secondary Diagnosis: Active Problems:   Schizophrenia ([place])  Long Term Goal(s): Improvement in symptoms so as ready for discharge Improvement in symptoms so as ready for discharge   Short Term Goals: Ability to verbalize feelings will improve Ability to disclose and discuss suicidal ideas Ability to identify and develop effective coping behaviors will improve Ability to identify and develop effective coping behaviors will improve Ability to maintain clinical measurements within normal limits will improve Compliance with prescribed medications will improve Ability to identify triggers associated with substance abuse/mental health issues will improve     Medication Management: Evaluate patient's response, side effects, and  tolerance of medication regimen.  Therapeutic Interventions: 1 to 1 sessions, Unit Group sessions and Medication administration.  Evaluation of Outcomes: Progressing   RN Treatment Plan for  Primary Diagnosis: <principal problem not specified> Long Term Goal(s): Knowledge of disease and therapeutic regimen to maintain health will improve  Short Term Goals: Ability to verbalize frustration and anger appropriately will improve, Ability to identify and develop effective coping behaviors will improve and Compliance with prescribed medications will improve  Medication Management: RN will administer medications as ordered by provider, will assess and evaluate patient's response and provide education to patient for prescribed medication. RN will report any adverse and/or side effects to prescribing provider.  Therapeutic Interventions: 1 on 1 counseling sessions, Psychoeducation, Medication administration, Evaluate responses to treatment, Monitor vital signs and CBGs as ordered, Perform/monitor CIWA, COWS, AIMS and Fall Risk screenings as ordered, Perform wound care treatments as ordered.  Evaluation of Outcomes: Progressing   LCSW Treatment Plan for Primary Diagnosis: <principal problem not specified> Long Term Goal(s): Safe transition to appropriate next level of care at discharge, Engage patient in therapeutic group addressing interpersonal concerns.  Short Term Goals: Engage patient in aftercare planning with referrals and resources, Increase social support, Increase emotional regulation, Identify triggers associated with mental health/substance abuse issues and Increase skills for wellness and recovery  Therapeutic Interventions: Assess for all discharge needs, 1 to 1 time with Social worker, Explore available resources and support systems, Assess for adequacy in community support network, Educate family and significant other(s) on suicide prevention, Complete Psychosocial Assessment, Interpersonal group therapy.  Evaluation of Outcomes: Progressing  Progress in Treatment: Attending groups: Yes. Participating in groups: Yes. Taking medication as prescribed: Yes. Toleration  medication: Yes. Family/Significant other contact made: No, will contact:  SPE reviewed with patient. Patient understands diagnosis: Yes. Discussing patient identified problems/goals with staff: Yes. Medical problems stabilized or resolved: No. Denies suicidal/homicidal ideation: Yes. Issues/concerns per patient self-inventory: Yes.  New problem(s) identified: Yes, Describe:  limited supports, frequent hospitalizations.  New Short Term/Long Term Goal(s): medication management for mood stabilization; elimination of SI thoughts; development of comprehensive mental wellness/sobriety plan.  Patient Goals: Manage symptoms and medication to "stay out of the hospital."  Discharge Plan or Barriers: Expected to return home and follow up with Dr.Reddy. Declines ACTT referrals at this time.   Reason for Continuation of Hospitalization: Anxiety Delusions  Medication stabilization Suicidal ideation  Estimated Length of Stay: 3-5 days  Attendees: Patient: Albert [name] 10/05/2019 11:31 AM  Physician: Dr.Farah 10/05/2019 11:31 AM  Nursing: Marton Redwood, RN 10/05/2019 11:31 AM  RN Care Manager: 10/05/2019 11:31 AM  Social Worker: Enid Cutter, LCSWA 10/05/2019 11:31 AM  Recreational Therapist:  10/05/2019 11:31 AM  Other:  10/05/2019 11:31 AM  Other:  10/05/2019 11:31 AM  Other: 10/05/2019 11:31 AM    Scribe for Treatment Team: Darreld Mclean, LCSWA 10/05/2019 11:31 AM

## 2019-10-06 NOTE — Progress Notes (Signed)
Patient denies SI, HI and AVH this shift.  Patient has been less irritable this shift.  Patient has been compliant with medications, attended groups and engage patient in 1:1 staff talks.   Assess patient for safety, offer medications as prescribed, engage patient in 1:1 staff talks.   Continue to monitor as planned. Patient able to contract for safety.

## 2019-10-06 NOTE — Progress Notes (Signed)
The patient verbalized that he made a phone call today regarding his broken motor scooter. His goal for tomorrow is to call to see if his scooter is ready to be picked up.

## 2019-10-06 NOTE — Progress Notes (Signed)
   10/06/19 2000  Psych Admission Type (Psych Patients Only)  Admission Status Voluntary  Psychosocial Assessment  Patient Complaints None  Eye Contact Brief  Facial Expression Flat  Affect Blunted  Speech Tangential  Interaction Assertive  Motor Activity Slow  Appearance/Hygiene In scrubs  Behavior Characteristics Appropriate to situation  Mood Depressed  Aggressive Behavior  Effect No apparent injury  Thought Process  Coherency WDL  Content Preoccupation  Delusions None reported or observed  Perception WDL  Hallucination None reported or observed  Judgment Impaired  Confusion None  Danger to Self  Current suicidal ideation? Denies  Danger to Others  Danger to Others None reported or observed   Pt pleasant on the unit this evening. Pt not as labile as first few days here

## 2019-10-06 NOTE — Progress Notes (Signed)
[facility] MD Progress Note  10/06/2019 10:24 AM [facility]  MR[facility]:  [id number] Subjective:   Patient had an uneventful night, slept well and feels rested. He is compliant fully with his meds today he denies thoughts of harming self or others. He still remains delusional regarding landlord he states he is looking for a new place to stay. Denies auditory and visual hallucinations.  When asked if he is ready to be discharged, he feels it would be best to wait a couple more days. Although he is an agreement that he must find a different place to live, he is not completely on board and would rather stay where he is at if it was up to him.   No EPS or TD   Principal Problem: Bipolar/schizoaffective/fixed delusions Diagnosis: Active Problems:   Schizophrenia ([facility])  Total Time spent with patient: 20 minutes  Past Psychiatric History: Multiple admissions even this year  Past Medical History:  Past Medical History:  Diagnosis Date  . Alcoholism ([facility])   . Drug abuse ([facility])    xanax addiction 5 years ago  . Medical non-compliance   . Paranoid schizophrenia ([facility])   . Schizophrenia ([facility])   . Seizures ([facility])    History reviewed. No pertinent surgical history. Family History:  Family History  Problem Relation Age of Onset  . Thyroid disease Mother   . Depression Maternal Aunt    Family Psychiatric  History: No new data Social History:  Social History   Substance and Sexual Activity  Alcohol Use Not Currently     Social History   Substance and Sexual Activity  Drug Use Not Currently  . Types: Marijuana   Comment: xanax- several years ago    Social History   Socioeconomic History  . Marital status: Single    Spouse name: Not on file  . Number of children: Not on file  . Years of education: Not on file  . Highest education level: Not on file  Occupational History    Comment: Unclear  Social Needs  . Financial resource strain: Not on file  . Food insecurity    Worry: Not on  file    Inability: Not on file  . Transportation needs    Medical: Not on file    Non-medical: Not on file  Tobacco Use  . Smoking status: Never Smoker  . Smokeless tobacco: Never Used  . Tobacco comment: Non-smoker  Substance and Sexual Activity  . Alcohol use: Not Currently  . Drug use: Not Currently    Types: Marijuana    Comment: xanax- several years ago  . Sexual activity: Not Currently  Lifestyle  . Physical activity    Days per week: Not on file    Minutes per session: Not on file  . Stress: Not on file  Relationships  . Social Musicianconnections    Talks on phone: Not on file    Gets together: Not on file    Attends religious service: Not on file    Active member of club or organization: Not on file    Attends meetings of clubs or organizations: Not on file    Relationship status: Not on file  Other Topics Concern  . Not on file  Social History Narrative   ** Merged History Encounter **    Pt reported that he lives in a house apartment; he is followed by Dr. Betti Cruzeddy.  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   Additional  Social History:                        Sleep: Good  Appetite:  Good  Current Medications: Current Facility-Administered Medications  Medication Dose Route Frequency Provider Last Rate Last Dose  . benztropine (COGENTIN) tablet 1 mg  1 mg Oral BID Rankin, Shuvon B, NP   1 mg at 10/06/19 0747  . divalproex (DEPAKOTE) DR tablet 1,500 mg  1,500 mg Oral QHS Rankin, Shuvon B, NP   1,500 mg at 10/05/19 2051  . paliperidone (INVEGA) 24 hr tablet 9 mg  9 mg Oral Daily Malvin Johns, MD   9 mg at 10/06/19 0747  . pantoprazole (PROTONIX) EC tablet 40 mg  40 mg Oral BID Malvin Johns, MD   40 mg at 10/06/19 0747  . QUEtiapine (SEROQUEL) tablet 400 mg  400 mg Oral QHS Malvin Johns, MD   400 mg at 10/05/19 2051    Lab Results: No results found for this or any previous visit (from the past 48  hour(s)).  Blood Alcohol level:  Lab Results  Component Value Date   ETH <10 09/28/2019   ETH <10 09/01/2019    Metabolic Disorder Labs: Lab Results  Component Value Date   HGBA1C 5.1 01/05/2017   MPG 100 01/05/2017   Lab Results  Component Value Date   PROLACTIN 30.4 (H) 01/06/2017   PROLACTIN 13.1 01/05/2017   Lab Results  Component Value Date   CHOL 172 01/05/2017   TRIG 112 01/05/2017   HDL 41 01/05/2017   CHOLHDL 4.2 01/05/2017   VLDL 22 01/05/2017   LDLCALC 109 (H) 01/05/2017    Physical Findings: AIMS: Facial and Oral Movements Muscles of Facial Expression: None, normal Lips and Perioral Area: None, normal Jaw: None, normal Tongue: None, normal,Extremity Movements Upper (arms, wrists, hands, fingers): None, normal Lower (legs, knees, ankles, toes): None, normal, Trunk Movements Neck, shoulders, hips: None, normal, Overall Severity Severity of abnormal movements (highest score from questions above): None, normal Incapacitation due to abnormal movements: None, normal Patient's awareness of abnormal movements (rate only patient's report): No Awareness, Dental Status Current problems with teeth and/or dentures?: No Does patient usually wear dentures?: No  CIWA:  CIWA-Ar Total: 0 COWS:  COWS Total Score: 1  Musculoskeletal: Strength & Muscle Tone: within normal limits Gait & Station: normal Patient leans: N/A  Psychiatric Specialty Exam: Physical Exam  ROS  Blood pressure 110/66, pulse (!) 54, temperature (!) 97.3 F (36.3 C), temperature source Oral, resp. rate 20, height 5\' 10"  (1.778 m), weight 109.8 kg.Body mass index is 34.72 kg/m.  General Appearance: Casual,   Eye Contact:  Good  Speech:  Clear and Coherent  Volume:  Normal  Mood:  Euthymic  Affect:  Congruent  Thought Process:  Coherent, Goal Directed and Linear  Orientation:  Full (Time, Place, and Person)  Thought Content:  Illogical and Delusions, less prominent vs admission   Suicidal  Thoughts:  No  Homicidal Thoughts:  No  Memory:  Immediate;   Fair Recent;   Fair Remote;   Fair  Judgement:  Fair  Insight:  Shallow  Psychomotor Activity:  Normal  Concentration:  Concentration: Fair and Attention Span: Fair  Recall:  of Knowledge:  Fair  Language:  Fair  Akathisia:  Negative  Handed:  Right  AIMS (if indicated):     Assets:  Leisure Time Physical Health Resilience  ADL's:  Intact  Cognition:  WNL  Sleep:  Number of Hours: 5.5     Treatment Plan Summary: Daily contact with patient to assess and evaluate symptoms and progress in treatment and Medication management  Continue current precautions no change in medications continue reality based therapy probable discharge at the end of the week. We are awaiting for the patient to find a different place to live.   Trinna Post, Medical Student 10/06/2019, 10:24 AM

## 2019-10-06 NOTE — Progress Notes (Signed)
Recreation Therapy Notes  Date: 12.8.20 Time: 1000 Location: 500 Hall Dayroom  Group Topic: Wellness  Goal Area(s) Addresses:  Patient will define components of whole wellness. Patient will verbalize benefit of whole wellness.  Intervention: Music  Activity:  Exercise.  LRT led patients in a serious of stretches.  Patients then led group in an exercise of their choosing.  Group was to complete at least 30 minutes of exercise.  Patients could take breaks or get water as needed.  Education: Wellness, Dentist.   Education Outcome: Acknowledges education/In group clarification offered/Needs additional education.   Clinical Observations/Feedback: Pt did not attend group.    Victorino Sparrow, LRT/CTRS         Ria Comment, [place] A 10/06/2019 11:05 AM

## 2019-10-07 MED ORDER — BENZTROPINE MESYLATE 1 MG PO TABS: 1.0000 mg | ORAL_TABLET | Freq: Two times a day (BID) | ORAL | 0 refills | Status: DC

## 2019-10-07 MED ORDER — QUETIAPINE FUMARATE 400 MG PO TABS: 400.0000 mg | ORAL_TABLET | Freq: Every day | ORAL | 0 refills | Status: DC

## 2019-10-07 MED ORDER — DIVALPROEX SODIUM 500 MG PO DR TAB: 1500.0000 mg | DELAYED_RELEASE_TABLET | Freq: Every day | ORAL | 0 refills | Status: DC

## 2019-10-07 MED ORDER — PANTOPRAZOLE SODIUM 40 MG PO TBEC: 40.0000 mg | DELAYED_RELEASE_TABLET | Freq: Two times a day (BID) | ORAL | 0 refills | Status: DC

## 2019-10-07 MED ORDER — PALIPERIDONE PALMITATE ER 234 MG/1.5ML IM SUSY
234.0000 mg | PREFILLED_SYRINGE | Freq: Once | INTRAMUSCULAR | Status: AC
  Administered 2019-10-07: 234 mg via INTRAMUSCULAR
  Filled 2019-10-07: qty 1.5

## 2019-10-07 MED ORDER — PALIPERIDONE PALMITATE ER 234 MG/1.5ML IM SUSY: 234.0000 mg | PREFILLED_SYRINGE | Freq: Once | INTRAMUSCULAR | 0 refills | Status: DC

## 2019-10-07 MED ORDER — PALIPERIDONE ER 9 MG PO TB24: 9.0000 mg | ORAL_TABLET | Freq: Every day | ORAL | 0 refills | Status: DC

## 2019-10-07 MED ORDER — INVEGA SUSTENNA 234 MG/1.5ML IM SUSY: 234.0000 mg | PREFILLED_SYRINGE | INTRAMUSCULAR | 0 refills | Status: DC

## 2019-10-07 NOTE — Discharge Summary (Signed)
Physician Discharge Summary Note  Patient:  Albert [name] is an 43 y.o., male  MRN:  [id number]  DOB:  [birth year]  Patient phone:  [phone] (home)   Patient address:   [address] [place] [address],   Total Time spent with patient: Greater than 30 minutes  Date of Admission:  09/29/2019  Date of Discharge: 10/07/19  Reason for Admission: Fixed delusional beliefs.   Principal Problem: Schizophrenia [facility])  Discharge Diagnoses: Principal Problem:   Schizophrenia [facility])  Past Psychiatric History: Schizophrenia.  Past Medical History:  Past Medical History:  Diagnosis Date  . Alcoholism (HCC)   . Drug abuse (HCC)    xanax addiction 5 years ago  . Medical non-compliance   . Paranoid schizophrenia (HCC)   . Schizophrenia (HCC)   . Seizures (HCC)    History reviewed. No pertinent surgical history.  Family History:  Family History  Problem Relation Age of Onset  . Thyroid disease Mother   . Depression Maternal Aunt    Social History:  Social History   Substance and Sexual Activity  Alcohol Use Not Currently     Social History   Substance and Sexual Activity  Drug Use Not Currently  . Types: Marijuana   Comment: xanax- several years ago    Social History   Socioeconomic History  . Marital status: Single    Spouse name: Not on file  . Number of children: Not on file  . Years of education: Not on file  . Highest education level: Not on file  Occupational History    Comment: Unclear  Social Needs  . Financial resource strain: Not on file  . Food insecurity    Worry: Not on file    Inability: Not on file  . Transportation needs    Medical: Not on file    Non-medical: Not on file  Tobacco Use  . Smoking status: Never Smoker  . Smokeless tobacco: Never Used  . Tobacco comment: Non-smoker  Substance and Sexual Activity  . Alcohol use: Not Currently  . Drug use: Not Currently    Types: Marijuana    Comment: xanax- several  years ago  . Sexual activity: Not Currently  Lifestyle  . Physical activity    Days per week: Not on file    Minutes per session: Not on file  . Stress: Not on file  Relationships  . Social Musician on phone: Not on file    Gets together: Not on file    Attends religious service: Not on file    Active member of club or organization: Not on file    Attends meetings of clubs or organizations: Not on file    Relationship status: Not on file  Other Topics Concern  . Not on file  Social History Narrative   ** Merged History Encounter **    Pt reported that he lives in a house apartment; he is followed by Dr. Betti Cruz.  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   Hospital Course: (Per Md's admission evaluation notes): This is the latest of multiple healthcare encounters and psychiatric admissions in our facility even this year, at least 5 for [facility]. He is 43 years of age has been diagnosed with a schizophrenic versus schizoaffective type condition, and despite long-acting injectable medications presents once again with fixed delusional believes. The patient has been difficult to treat because he tends to dictate care and resist  all medications except a subtherapeutic dose of quetiapine however over the last couple of admissions, with coordination with his outpatient doctor, Dr. Reece Levy, we were able to convince him to begin long-acting injectable medications however he continues to have fixed delusions about his landlord.  At first it was just that the landlord was talking about him but on this presentation these delusions of gotten more diffuse, hypersexual, disjointed and bizarre.  This is one of several discharged summaries from this [facility] for [facility]. He is well known on this [facility] adult unit from previous hospitalizations for mood stabilization treatments. Was actually discharged last month on 09-09-19 after mood stabilization  treatments. After stabilization of his symptoms, Albert [name] was discharged with an outpatient psychiatric services for routine mental health care & medication management. He presented for admission this time around with his usual & similar presentations; worsening fixed delusional beliefs, disorganized thoughts & a clear deviation from his baseline. He was seeking evaluation of his symptoms & treatments.  After evaluation of his presenting symptoms, Albert [name] was again recommended for mood stabilization treatments. The medication regimen for his presenting symptoms were discussed & with his consent initiated. He received, stabilized & was discharged on the medications as listed below on his discharge medication lists. He was also enrolled & participated in the group counseling sessions being offered & held on this unit. He learned coping skills. He presented on this admission, other medical conditions (GERD) that required treatment & monitoring. He was resumed/discharged on all his pertinent home medications for those health issues. He tolerated his treatment regimen without any adverse effects or reactions reported.   And because of the chronic nature of his psychiatric symptoms & their resistance to the treatment regimen, Albert [name] was treated, stabilized & being discharged on two separate antipsychotic medications (Invega & Seroquel). This is because he has not been able to achieve symptoms control under an antipsychotic monotherapy. These two combination antipsychotic medications seem effective in stabilizing his symptoms at this time. It will benefit Albert [name] to continue on these combination antipsychotic therapies as recommended. However, as his symptoms continue to improve, he may then be titrated down to an antipsychotic monotherapy. This is to prevent the chances for the development of metabolic syndrome usually associated with use of multiple antipsychotic regimen. This has to be done within the  proper monitoring, discretion & judgement of his outpatient psychiatric provider.  During the course of his hospitalization, the 15-minute checks were adequate to ensure Albert [name]'s safety.  Patient did not display any dangerous, violent or suicidal behavior on the unit.  He interacted with patients & staff appropriately, participated appropriately in the group sessions/therapies. His medications were addressed & adjusted to meet his needs. He was recommended for outpatient follow-up care & medication management upon discharge to assure his continuity of care & mood stability.  At the time of this hospital discharge, patient is not reporting any acute suicidal/homicidal ideations. He feels more confident about his mental state. He currently denies any new issues or concerns. Education and supportive counseling provided throughout his hospital stay & upon discharge.   Today upon his discharge evaluation with the attending psychiatrist, Albert [name] shares he is doing well. He denies any other specific concerns. He is sleeping well. His appetite is good. He denies other physical complaints. He denies AH/VH. He feels that his medications have been helpful & is in agreement to continue his current treatment regimen as recommended. He was able to engage in safety planning including plan to return to [facility] or  contact emergency services if he feels unable to maintain his own safety or the safety of others. Pt had no further questions, comments, or concerns. He left [facility] with all personal belongings in no apparent distress. Transportation per family.   Physical Findings: AIMS: Facial and Oral Movements Muscles of Facial Expression: None, normal Lips and Perioral Area: None, normal Jaw: None, normal Tongue: None, normal,Extremity Movements Upper (arms, wrists, hands, fingers): None, normal Lower (legs, knees, ankles, toes): None, normal, Trunk Movements Neck, shoulders, hips: None, normal, Overall  Severity Severity of abnormal movements (highest score from questions above): None, normal Incapacitation due to abnormal movements: None, normal Patient's awareness of abnormal movements (rate only patient's report): No Awareness, Dental Status Current problems with teeth and/or dentures?: No Does patient usually wear dentures?: No  CIWA:  CIWA-Ar Total: 0 COWS:  COWS Total Score: 1  Musculoskeletal: Strength & Muscle Tone: within normal limits Gait & Station: normal Patient leans: N/A  Psychiatric Specialty Exam: Physical Exam  Nursing note and vitals reviewed. Constitutional: He appears well-developed.  Neck: Normal range of motion.  Cardiovascular: Normal rate.  Respiratory: Effort normal.  Genitourinary:    Genitourinary Comments: Deferred   Musculoskeletal: Normal range of motion.  Neurological: He is alert.  Skin: Skin is warm.    Review of Systems  Constitutional: Negative for chills and fever.  HENT: Negative.   Respiratory: Negative for cough, shortness of breath and wheezing.   Cardiovascular: Negative for chest pain and palpitations.  Gastrointestinal: Negative for abdominal pain, heartburn, nausea and vomiting.  Musculoskeletal: Negative for myalgias.  Skin: Negative.   Neurological: Negative for dizziness and headaches.  Endo/Heme/Allergies: Negative.   Psychiatric/Behavioral: Positive for depression (Stabilized with medication prior to discharge) and hallucinations (Hx. Psychosis (Stabilized with medication prior to discharge)). Negative for memory loss, substance abuse and suicidal ideas. The patient has insomnia (Stabilized with medication prior to discharge ). The patient is not nervous/anxious (Stable).     Blood pressure 110/66, pulse (!) 54, temperature (!) 97.3 F (36.3 C), temperature source Oral, resp. rate 20, height 5\' 10"  (1.778 m), weight 109.8 kg.Body mass index is 34.72 kg/m.  See Md's discharge SRA   Have you used any form of tobacco in the  last 30 days? (Cigarettes, Smokeless Tobacco, Cigars, and/or Pipes): No  Has this patient used any form of tobacco in the last 30 days? (Cigarettes, Smokeless Tobacco, Cigars, and/or Pipes):  N/A  Blood Alcohol level:  Lab Results  Component Value Date   ETH <10 09/28/2019   ETH <10 09/01/2019   Metabolic Disorder Labs:  Lab Results  Component Value Date   HGBA1C 5.1 01/05/2017   MPG 100 01/05/2017   Lab Results  Component Value Date   PROLACTIN 30.4 (H) 01/06/2017   PROLACTIN 13.1 01/05/2017   Lab Results  Component Value Date   CHOL 172 01/05/2017   TRIG 112 01/05/2017   HDL 41 01/05/2017   CHOLHDL 4.2 01/05/2017   VLDL 22 01/05/2017   LDLCALC 109 (H) 01/05/2017   See Psychiatric Specialty Exam and Suicide Risk Assessment completed by Attending Physician prior to discharge.  Discharge destination:  Home  Is patient on multiple antipsychotic therapies at discharge: Yes,   Do you recommend tapering to monotherapy for antipsychotics?  Yes   Has Patient had three or more failed trials of antipsychotic monotherapy by history:  Yes,   Antipsychotic medications that previously failed include:   1.  Haldol., 2.  Olanzapine. and 3.  Seroqel.  Recommended Plan  for Multiple Antipsychotic Therapies: Additional reason(s) for multiple antispychotic treatment:  This patient has not been able to achieve symptom control under an antipsychotic monotherapy. However, the use of a combination antipsychotic therapies has shown stabilization of patient's symptoms that warranted this discharge. However, in the event that patient's symptoms subside, he may then be titrated down to an antipsychotic therapy to prevent the devolopment metabolic syndrome associated with use of multiple antipsychotic therapies. This has to be done under the watch & supervision of his outpatient provider.  Allergies as of 10/07/2019   No Known Allergies     Medication List    STOP taking these medications    omeprazole 40 MG capsule Commonly known as: PRILOSEC     TAKE these medications     Indication  benztropine 1 MG tablet Commonly known as: COGENTIN Take 1 tablet (1 mg total) by mouth 2 (two) times daily. For prevention of drug induced trmors What changed: additional instructions  Indication: Extrapyramidal Reaction caused by Medications   divalproex 500 MG DR tablet Commonly known as: DEPAKOTE Take 3 tablets (1,500 mg total) by mouth at bedtime. For mood stabilization What changed: additional instructions  Indication: Mood stabilization   Invega Sustenna 234 MG/1.5ML Susy injection Generic drug: paliperidone Inject 234 mg into the muscle every 30 (thirty) days. (Due on 11-09-19): For mood control Start taking on: November 09, 2019 What changed:   additional instructions  These instructions start on November 09, 2019. If you are unsure what to do until then, ask your doctor or other care provider.  Indication: Schizoaffective Disorder, Mood control   paliperidone 9 MG 24 hr tablet Commonly known as: INVEGA Take 1 tablet (9 mg total) by mouth daily. For mood control Start taking on: October 08, 2019 What changed:   medication strength  how much to take  additional instructions  Indication: Schizoaffective Disorder, Mood control   pantoprazole 40 MG tablet Commonly known as: PROTONIX Take 1 tablet (40 mg total) by mouth 2 (two) times daily. For acid reflux  Indication: Gastroesophageal Reflux Disease   QUEtiapine 400 MG tablet Commonly known as: SEROQUEL Take 1 tablet (400 mg total) by mouth at bedtime. For mood control What changed:   medication strength  how much to take  additional instructions  Indication: Mood control      Follow-up Information    [facility] Follow up.   Why: You are scheduled to meet with Dr. Betti Cruz on Wednesday, December 16th at 1:10pm. You are scheduled to receive your injection on this day. Thank  you. Contact information: [address], [place], [place] [address] [phone] Fax:[phone]         Follow-up recommendations: Activity:  As tolerated Diet: As recommended by your primary care doctor. Keep all scheduled follow-up appointments as recommended.  Comments: Prescriptions given at discharge.  Patient agreeable to plan.  Given opportunity to ask questions.  Appears to feel comfortable with discharge denies any current suicidal or homicidal thought. Patient is also instructed prior to discharge to: Take all medications as prescribed by his/her mental healthcare provider. Report any adverse effects and or reactions from the medicines to his/her outpatient provider promptly. Patient has been instructed & cautioned: To not engage in alcohol and or illegal drug use while on prescription medicines. In the event of worsening symptoms, patient is instructed to call the crisis hotline, 911 and or go to the nearest ED for appropriate evaluation and treatment of symptoms. To follow-up with  his/her primary care provider for your other medical issues, concerns and or health care needs.  Signed: Armandina StammerAgnes Nwoko, NP, pMHNP, FNP-BC 10/07/2019, 2:31 PM

## 2019-10-07 NOTE — Progress Notes (Signed)
Recreation Therapy Notes  INPATIENT RECREATION TR PLAN  Patient Details Name: [name] MRN: [id number] DOB: 1976/08/10 Today's Date: 10/07/2019  Rec Therapy Plan Is patient appropriate for Therapeutic Recreation?: Yes Treatment times per week: about 3 days Estimated Length of Stay: 5-7 days TR Treatment/Interventions: Group participation (Comment)  Discharge Criteria Pt will be discharged from therapy if:: Discharged Treatment plan/goals/alternatives discussed and agreed upon by:: Patient/family  Discharge Summary Short term goals set: See patient care plan Short term goals met: Complete Progress toward goals comments: Groups attended Which groups?: Coping skills, Communication, Other (Comment)(Team building) Reason goals not met: None Therapeutic equipment acquired: N/A Reason patient discharged from therapy: Discharge from hospital Pt/family agrees with progress & goals achieved: Yes Date patient discharged from therapy: 10/07/19    Victorino Sparrow, LRT/CTRS  Ria Comment, [place] 10/07/2019, 1:17 PM

## 2019-10-07 NOTE — Plan of Care (Signed)
Pt was able to engage in groups without prompting or encouragement at completion of recreation therapy group sessions.    Victorino Sparrow, LRT/CTRS

## 2019-10-07 NOTE — Progress Notes (Signed)
  [facility] Adult Case Management Discharge Plan :  Will you be returning to the same living situation after discharge:  Yes,  home. At discharge, do you have transportation home?: Yes,  family will pick up after 12:30pm Do you have the ability to pay for your medications: Yes,  Medicare.  Release of information consent forms completed and in the chart. Letter on chart.  Patient to Follow up at: [facility] Follow up.   Why: You are scheduled to meet with Dr. Reece Levy on Wednesday, December 16th at 1:10pm. You are scheduled to receive your injection on this day. Thank you. Contact information: [address] [place], [place], [place] [address] [phone] [phone]          Next level of care provider has access to [place] and Suicide Prevention discussed: Yes,  with patient.  Have you used any form of tobacco in the last 30 days? (Cigarettes, Smokeless Tobacco, Cigars, and/or Pipes): No  Has patient been referred to the Quitline?: N/A patient is not a smoker  Patient has been referred for addiction treatment: Yes  [name], [place] 10/07/2019, 10:08 AM

## 2019-10-07 NOTE — BHH Suicide Risk Assessment (Signed)
[facility] Discharge Suicide Risk Assessment   Principal Problem: Schizophrenia [facility]) Discharge Diagnoses: Principal Problem:   Schizophrenia ([facility])   Total Time spent with patient: 45 minutes  Musculoskeletal: Strength & Muscle Tone: within normal limits Gait & Station: normal Patient leans: N/A  Psychiatric Specialty Exam: ROS  Blood pressure 110/66, pulse (!) 54, temperature (!) 97.3 F (36.3 C), temperature source Oral, resp. rate 20, height 5\' 10"  (1.778 m), weight 109.8 kg.Body mass index is 34.72 kg/m.  General Appearance: Casual  Eye Contact::  Good  Speech:  Clear and Coherent409  Volume:  Normal  Mood:  Euthymic  Affect:  Restricted  Thought Process:  Coherent, Linear and Descriptions of Associations: Circumstantial  Orientation:  Full (Time, Place, and Person)  Thought Content:  Delusions  Suicidal Thoughts:  No  Homicidal Thoughts:  No  Memory:  Immediate;   Fair Recent;   Fair Remote;   Fair  Judgement:  Good  Insight:  Fair  Psychomotor Activity:  Normal  Concentration:  Fair  Recall:  [employer] of Knowledge:Fair  Language: Fair  Akathisia:  Negative  Handed:  Right  AIMS (if indicated):     Assets:  Communication Skills Desire for Improvement Financial Resources/Insurance Housing Leisure Time Physical Health Resilience  Sleep:  Number of Hours: 6.5  Cognition: WNL  ADL's:  Intact   Mental Status Per Nursing Assessment::   On Admission:  NA  Demographic Factors:  Unemployed  Loss Factors: Decrease in vocational status  Historical Factors: NA  Risk Reduction Factors:   Sense of responsibility to family and Religious beliefs about death  Continued Clinical Symptoms:  Previous Psychiatric Diagnoses and Treatments  Cognitive Features That Contribute To Risk:  Polarized thinking    Suicide Risk:  Minimal: No identifiable suicidal ideation.  Patients presenting with no risk factors but with morbid ruminations; may be classified as minimal  risk based on the severity of the depressive symptoms  Follow-up [facility] Follow up.   Why: You are scheduled to meet with Dr. Reece Levy on Wednesday, December 16th at 1:10pm. You are scheduled to receive your injection on this day. Thank you. Contact information: [address] [place], [place], [place] [address] [phone] [phone]          Plan Of Care/Follow-up recommendations:  Activity:  full  Tawonna Esquer, MD 10/07/2019, 9:10 AM

## 2019-10-07 NOTE — Progress Notes (Signed)
Patient ID: Albert [name], male   DOB: 1976/07/29, 43 y.o.   MRN: [id number]

## 2020-01-10 ENCOUNTER — Emergency Department (HOSPITAL_COMMUNITY)
Admission: EM | Admit: 2020-01-10 | Discharge: 2020-01-11 | Disposition: A | Discharge status: Home / Self Care | Payer: Medicare Other | Attending: Emergency Medicine | Admitting: Emergency Medicine

## 2020-01-10 ENCOUNTER — Encounter (HOSPITAL_COMMUNITY): Payer: Self-pay

## 2020-01-10 ENCOUNTER — Other Ambulatory Visit: Payer: Self-pay

## 2020-01-10 DIAGNOSIS — F4329 Adjustment disorder with other symptoms: Secondary | ICD-10-CM

## 2020-01-10 DIAGNOSIS — F259 Schizoaffective disorder, unspecified: Secondary | ICD-10-CM | POA: Diagnosis not present

## 2020-01-10 DIAGNOSIS — F4325 Adjustment disorder with mixed disturbance of emotions and conduct: Secondary | ICD-10-CM | POA: Diagnosis not present

## 2020-01-10 DIAGNOSIS — F1221 Cannabis dependence, in remission: Secondary | ICD-10-CM | POA: Diagnosis present

## 2020-01-10 DIAGNOSIS — Z79899 Other long term (current) drug therapy: Secondary | ICD-10-CM | POA: Insufficient documentation

## 2020-01-10 DIAGNOSIS — Z20822 Contact with and (suspected) exposure to covid-19: Secondary | ICD-10-CM | POA: Diagnosis not present

## 2020-01-10 DIAGNOSIS — F1021 Alcohol dependence, in remission: Secondary | ICD-10-CM

## 2020-01-10 DIAGNOSIS — Z91199 Patient's noncompliance with other medical treatment and regimen due to unspecified reason: Secondary | ICD-10-CM

## 2020-01-10 DIAGNOSIS — F329 Major depressive disorder, single episode, unspecified: Secondary | ICD-10-CM | POA: Diagnosis not present

## 2020-01-10 DIAGNOSIS — Z9119 Patient's noncompliance with other medical treatment and regimen: Secondary | ICD-10-CM

## 2020-01-10 DIAGNOSIS — F410 Panic disorder [episodic paroxysmal anxiety] without agoraphobia: Secondary | ICD-10-CM

## 2020-01-10 DIAGNOSIS — F419 Anxiety disorder, unspecified: Secondary | ICD-10-CM | POA: Insufficient documentation

## 2020-01-10 LAB — COMPREHENSIVE METABOLIC PANEL
ALT: 22 U/L (ref 0–44)
AST: 24 U/L (ref 15–41)
Albumin: 3.9 g/dL (ref 3.5–5.0)
Alkaline Phosphatase: 58 U/L (ref 38–126)
Anion gap: 9 (ref 5–15)
BUN: 17 mg/dL (ref 6–20)
CO2: 25 mmol/L (ref 22–32)
Calcium: 9 mg/dL (ref 8.9–10.3)
Chloride: 105 mmol/L (ref 98–111)
Creatinine, Ser: 0.75 mg/dL (ref 0.61–1.24)
GFR calc Af Amer: >60 mL/min (ref >60)
GFR calc non Af Amer: >60 mL/min (ref >60)
Glucose, Bld: 94 mg/dL (ref 70–99)
Potassium: 4.1 mmol/L (ref 3.5–5.1)
Sodium: 139 mmol/L (ref 135–145)
Total Bilirubin: 0.8 mg/dL (ref 0.3–1.2)
Total Protein: 7.4 g/dL (ref 6.5–8.1)

## 2020-01-10 LAB — CBC WITH DIFFERENTIAL/PLATELET
Abs Immature Granulocytes: 0.04 10*3/uL (ref 0.00–0.07)
Basophils Absolute: 0.1 10*3/uL (ref 0.0–0.1)
Basophils Relative: 1 %
Eosinophils Absolute: 0.1 10*3/uL (ref 0.0–0.5)
Eosinophils Relative: 1 %
HCT: 42.1 % (ref 39.0–52.0)
Hemoglobin: 14.2 g/dL (ref 13.0–17.0)
Immature Granulocytes: 1 %
Lymphocytes Relative: 28 %
Lymphs Abs: 2.1 10*3/uL (ref 0.7–4.0)
MCH: 31.3 pg (ref 26.0–34.0)
MCHC: 33.7 g/dL (ref 30.0–36.0)
MCV: 92.9 fL (ref 80.0–100.0)
Monocytes Absolute: 0.7 10*3/uL (ref 0.1–1.0)
Monocytes Relative: 10 %
Neutro Abs: 4.6 10*3/uL (ref 1.7–7.7)
Neutrophils Relative %: 59 %
Platelets: 266 10*3/uL (ref 150–400)
RBC: 4.53 MIL/uL (ref 4.22–5.81)
RDW: 13.9 % (ref 11.5–15.5)
WBC: 7.7 10*3/uL (ref 4.0–10.5)
nRBC: 0 % (ref 0.0–0.2)

## 2020-01-10 LAB — ETHANOL: Alcohol, Ethyl (B): <10 mg/dL (ref <10)

## 2020-01-10 LAB — VALPROIC ACID LEVEL: Valproic Acid Lvl: 26 ug/mL — ABNORMAL LOW (ref 50.0–100.0)

## 2020-01-10 LAB — RAPID URINE DRUG SCREEN, HOSP PERFORMED
Amphetamines: NOT DETECTED
Barbiturates: NOT DETECTED
Benzodiazepines: NOT DETECTED
Cocaine: NOT DETECTED
Opiates: NOT DETECTED
Tetrahydrocannabinol: NOT DETECTED

## 2020-01-10 MED ORDER — HYDROXYZINE HCL 25 MG PO TABS: 25.0000 mg | ORAL_TABLET | Freq: Once | ORAL | Status: DC
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
## 2020-01-11 ENCOUNTER — Encounter (HOSPITAL_COMMUNITY): Payer: Self-pay | Admitting: Registered Nurse

## 2020-01-11 DIAGNOSIS — F4329 Adjustment disorder with other symptoms: Secondary | ICD-10-CM

## 2020-01-11 DIAGNOSIS — F419 Anxiety disorder, unspecified: Secondary | ICD-10-CM | POA: Diagnosis not present

## 2020-01-11 LAB — SARS CORONAVIRUS 2 (TAT 6-24 HRS): SARS Coronavirus 2: NEGATIVE

## 2020-01-11 MED ORDER — QUETIAPINE FUMARATE 400 MG PO TABS: 400.0000 mg | ORAL_TABLET | Freq: Every day | ORAL | 0 refills | Status: DC

## 2020-01-11 MED ORDER — DIVALPROEX SODIUM 500 MG PO DR TAB
1500.0000 mg | DELAYED_RELEASE_TABLET | Freq: Every day | ORAL | Status: DC
  Administered 2020-01-11: 1500 mg via ORAL
  Filled 2020-01-11: qty 3

## 2020-01-11 MED ORDER — DIVALPROEX SODIUM 500 MG PO DR TAB: 1500.0000 mg | DELAYED_RELEASE_TABLET | Freq: Every day | ORAL | 0 refills | Status: DC

## 2020-01-11 MED ORDER — QUETIAPINE FUMARATE 300 MG PO TABS
400.0000 mg | ORAL_TABLET | Freq: Every day | ORAL | Status: DC
  Administered 2020-01-11: 400 mg via ORAL
  Filled 2020-01-11: qty 1

## 2020-01-11 MED ORDER — PANTOPRAZOLE SODIUM 40 MG PO TBEC: 40.0000 mg | DELAYED_RELEASE_TABLET | Freq: Two times a day (BID) | ORAL | 0 refills | Status: DC

## 2020-01-11 MED ORDER — PALIPERIDONE ER 9 MG PO TB24: 9.0000 mg | ORAL_TABLET | Freq: Every day | ORAL | 0 refills | Status: DC

## 2020-01-11 MED ORDER — PANTOPRAZOLE SODIUM 40 MG PO TBEC
40.0000 mg | DELAYED_RELEASE_TABLET | Freq: Two times a day (BID) | ORAL | Status: DC
  Administered 2020-01-11 (×2): 40 mg via ORAL
  Filled 2020-01-11 (×2): qty 1

## 2020-01-11 MED ORDER — BENZTROPINE MESYLATE 1 MG PO TABS: 1.0000 mg | ORAL_TABLET | Freq: Two times a day (BID) | ORAL | 0 refills | Status: DC

## 2020-01-11 MED ORDER — BENZTROPINE MESYLATE 1 MG PO TABS
1.0000 mg | ORAL_TABLET | Freq: Two times a day (BID) | ORAL | Status: DC
  Administered 2020-01-11: 1 mg via ORAL
  Filled 2020-01-11 (×2): qty 1

## 2020-01-11 MED ORDER — PALIPERIDONE ER 6 MG PO TB24
9.0000 mg | ORAL_TABLET | Freq: Every day | ORAL | Status: DC
  Administered 2020-01-11: 9 mg via ORAL
  Filled 2020-01-11: qty 1

## 2020-01-11 NOTE — ED Provider Notes (Signed)
  Physical Exam  BP 116/67 (BP Location: Right Arm)   Pulse 80   Temp 98.2 F (36.8 C) (Oral)   Resp 18   Ht 1.778 m (5\' 10" )   Wt 102.1 kg   SpO2 100%   BMI 32.28 kg/m   Physical Exam  Vitals:   01/10/20 2232 01/11/20 0630  BP: (!) 138/101 116/67  Pulse: 76 80  Resp: 20 18  Temp: 97.8 F (36.6 C) 98.2 F (36.8 C)  SpO2: 98% 100%     ED Course/Procedures     Procedures Review of labs reveal normal labs MDM     44 year old male who presented yesterday evening with complaints of anxiety.  He also reported feeling depressed.  He has a history of alcoholism but has been sober. On exam he was paranoid and had pressured speech with some tangential thoughts.  He also was paranoid. Behavioral health saw and recommended overnight observation in the emergency department with psychiatric evaluation in the morning   45, MD 01/11/20 [phone]
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
## 2020-01-11 NOTE — BH Assessment (Signed)
BHH Assessment Progress Note  Per Shuvon Rankin, FNP, this pt does not require psychiatric hospitalization at this time.  Pt is to be discharged from [facility].  Pt is reports that he currently receives virtual services, but would prefer face-to-face treatment.  He also feels that he is currently at risk to relapse on substances of abuse.  This Clinical research associate discussed the [facility] with pt, and he would like to have an intake appointment scheduled.  At 13:18 I called the [facility] and spoke to [place].  She has scheduled pt for an intake appointment on Tuesday, 01/12/2020 at 13:00.  This has been included in pt's discharge instructions.  Pt's nurse, Waynetta Sandy, has been notified.  Doylene Canning, MA Triage Specialist [phone]

## 2020-01-11 NOTE — Discharge Instructions (Addendum)
For your behavioral health needs, you are advised to follow up with the [facility].  You have an intake appointment scheduled for Tuesday, January 12, 2020 at 1:00 pm:       [facility]      [address] [place], [place] [address]      [phone]
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
## 2020-01-11 NOTE — ED Notes (Signed)
Pt DCd off unit to home per provider. Pt alert, calm, cooperative, no s/s of distress. DC information and resources given to and reviewed with pt, pt acknowledged understanding. Pt ambulatory off the unit, escorted by NT. Pt call cab for transportation.

## 2020-01-30 ENCOUNTER — Emergency Department (HOSPITAL_COMMUNITY)
Admission: EM | Admit: 2020-01-30 | Discharge: 2020-01-30 | Disposition: A | Discharge status: Home / Self Care | Payer: Medicare Other | Attending: Emergency Medicine | Admitting: Emergency Medicine

## 2020-01-30 ENCOUNTER — Other Ambulatory Visit: Payer: Self-pay

## 2020-01-30 ENCOUNTER — Encounter (HOSPITAL_COMMUNITY): Payer: Self-pay | Admitting: Emergency Medicine

## 2020-01-30 DIAGNOSIS — F29 Unspecified psychosis not due to a substance or known physiological condition: Secondary | ICD-10-CM | POA: Diagnosis present

## 2020-01-30 DIAGNOSIS — Z79899 Other long term (current) drug therapy: Secondary | ICD-10-CM | POA: Diagnosis not present

## 2020-01-30 DIAGNOSIS — F209 Schizophrenia, unspecified: Secondary | ICD-10-CM | POA: Insufficient documentation

## 2020-01-30 DIAGNOSIS — Z20822 Contact with and (suspected) exposure to covid-19: Secondary | ICD-10-CM | POA: Insufficient documentation

## 2020-01-30 LAB — VALPROIC ACID LEVEL: Valproic Acid Lvl: 65 ug/mL (ref 50.0–100.0)

## 2020-01-30 LAB — CBC
HCT: 43 % (ref 39.0–52.0)
Hemoglobin: 14.9 g/dL (ref 13.0–17.0)
MCH: 32.1 pg (ref 26.0–34.0)
MCHC: 34.7 g/dL (ref 30.0–36.0)
MCV: 92.7 fL (ref 80.0–100.0)
Platelets: 288 10*3/uL (ref 150–400)
RBC: 4.64 MIL/uL (ref 4.22–5.81)
RDW: 13.3 % (ref 11.5–15.5)
WBC: 7.3 10*3/uL (ref 4.0–10.5)
nRBC: 0 % (ref 0.0–0.2)

## 2020-01-30 LAB — COMPREHENSIVE METABOLIC PANEL
ALT: 16 U/L (ref 0–44)
AST: 18 U/L (ref 15–41)
Albumin: 4 g/dL (ref 3.5–5.0)
Alkaline Phosphatase: 62 U/L (ref 38–126)
Anion gap: 9 (ref 5–15)
BUN: 14 mg/dL (ref 6–20)
CO2: 24 mmol/L (ref 22–32)
Calcium: 9.1 mg/dL (ref 8.9–10.3)
Chloride: 105 mmol/L (ref 98–111)
Creatinine, Ser: 0.7 mg/dL (ref 0.61–1.24)
GFR calc Af Amer: >60 mL/min (ref >60)
GFR calc non Af Amer: >60 mL/min (ref >60)
Glucose, Bld: 95 mg/dL (ref 70–99)
Potassium: 4.1 mmol/L (ref 3.5–5.1)
Sodium: 138 mmol/L (ref 135–145)
Total Bilirubin: 0.7 mg/dL (ref 0.3–1.2)
Total Protein: 7.7 g/dL (ref 6.5–8.1)

## 2020-01-30 LAB — RAPID URINE DRUG SCREEN, HOSP PERFORMED
Amphetamines: NOT DETECTED
Barbiturates: NOT DETECTED
Benzodiazepines: NOT DETECTED
Cocaine: NOT DETECTED
Opiates: NOT DETECTED
Tetrahydrocannabinol: NOT DETECTED

## 2020-01-30 LAB — ETHANOL: Alcohol, Ethyl (B): <10 mg/dL (ref <10)

## 2020-01-30 LAB — SALICYLATE LEVEL: Salicylate Lvl: <7 mg/dL — ABNORMAL LOW (ref 7.0–30.0)

## 2020-01-30 LAB — ACETAMINOPHEN LEVEL: Acetaminophen (Tylenol), Serum: <10 ug/mL — ABNORMAL LOW (ref 10–30)

## 2020-01-30 LAB — SARS CORONAVIRUS 2 (TAT 6-24 HRS): SARS Coronavirus 2: NEGATIVE

## 2020-01-30 MED ORDER — PALIPERIDONE ER 6 MG PO TB24: 9.0000 mg | ORAL_TABLET | Freq: Every day | ORAL | Status: DC

## 2020-01-30 MED ORDER — DIVALPROEX SODIUM 500 MG PO DR TAB: 1500.0000 mg | DELAYED_RELEASE_TABLET | Freq: Every day | ORAL | Status: DC

## 2020-01-30 MED ORDER — BENZTROPINE MESYLATE 1 MG PO TABS: 1.0000 mg | ORAL_TABLET | Freq: Two times a day (BID) | ORAL | Status: DC

## 2020-01-30 MED ORDER — PANTOPRAZOLE SODIUM 40 MG PO TBEC: 40.0000 mg | DELAYED_RELEASE_TABLET | Freq: Two times a day (BID) | ORAL | Status: DC

## 2020-01-30 MED ORDER — QUETIAPINE FUMARATE 300 MG PO TABS: 400.0000 mg | ORAL_TABLET | Freq: Every day | ORAL | Status: DC

## 2020-01-30 NOTE — ED Notes (Signed)
Pt DCd off unit to home per provider. Pt alert, cooperative, cooperative, no s/s of distress. DC information given to and reviewed with pt, pt acknowledged understanding. Belongings given to pt. Pt ambulatory off unit, escorted by MHT. Pt has own transportation.

## 2020-01-30 NOTE — ED Provider Notes (Signed)
TIME SEEN: 7:03 AM  CHIEF COMPLAINT: Psychosis  HPI: Patient is a 44 year old male with history of paranoid schizophrenia, previous history of Xanax and alcohol abuse who presents to the emergency department with concerns for psychosis.  He reports over the past few weeks his neighbor Albert [name] has been whispering through the floor at him.  He states she will repeatedly say "I am your nay" and then "ta nay".  When asked what this means he states " I am a part of you."  He states that he has been on masturbating to this.  He states that she tells him to feel ashamed regarding the masturbating.  He states that all of this started after he began looking up sex trafficking on the Internet.  He states he thought about this after a friend took him to a class about sex trafficking.  When asked if he has had thoughts of wanting to harm himself, patient states that he thinks about getting on his scooter that "goes really fast" and falling off on a "bumpy road".  He denies hearing voices telling him to hurt himself or others but states the voices make him feel "resentful" and make him feel like he is going to drink again.  States he has been sober for 4 years.  Denies drug or alcohol use.  Patient denies any pain.  No fevers, cough, vomiting or diarrhea.  ROS: Level 5 caveat secondary to psychosis  PAST MEDICAL HISTORY/PAST SURGICAL HISTORY:  Past Medical History:  Diagnosis Date  . Alcoholism ([place])   . Drug abuse ([place])    xanax addiction 5 years ago  . Medical non-compliance   . Paranoid schizophrenia ([place])   . Schizophrenia ([place])   . Seizures ([place])     MEDICATIONS:  Prior to Admission medications   Medication Sig Start Date End Date Taking? Authorizing Provider  benztropine (COGENTIN) 1 MG tablet Take 1 tablet (1 mg total) by mouth 2 (two) times daily. 01/11/20   Rankin, Shuvon B, NP  divalproex (DEPAKOTE) 500 MG DR tablet Take 3 tablets (1,500 mg total) by mouth at bedtime. 01/11/20   Rankin, Shuvon  B, NP  paliperidone (INVEGA SUSTENNA) 234 MG/1.5ML SUSY injection Inject 234 mg into the muscle every 30 (thirty) days. (Due on 11-09-19): For mood control 11/09/19   Lindell Spar I, NP  paliperidone (INVEGA) 9 MG 24 hr tablet Take 1 tablet (9 mg total) by mouth daily. 01/12/20   Rankin, Shuvon B, NP  pantoprazole (PROTONIX) 40 MG tablet Take 1 tablet (40 mg total) by mouth 2 (two) times daily. 01/11/20   Rankin, Shuvon B, NP  QUEtiapine (SEROQUEL) 400 MG tablet Take 1 tablet (400 mg total) by mouth at bedtime. 01/11/20   Rankin, Shuvon B, NP    ALLERGIES:  No Known Allergies  SOCIAL HISTORY:  Social History   Tobacco Use  . Smoking status: Never Smoker  . Smokeless tobacco: Never Used  . Tobacco comment: Non-smoker  Substance Use Topics  . Alcohol use: Not Currently    FAMILY HISTORY: Family History  Problem Relation Age of Onset  . Thyroid disease Mother   . Depression Maternal Aunt     EXAM: BP (!) 152/100 (BP Location: Left Arm)   Pulse 83   Temp 97.6 F (36.4 C) (Oral)   Resp 20   SpO2 96%  CONSTITUTIONAL: Alert and oriented and responds appropriately to questions. Well-appearing; well-nourished HEAD: Normocephalic EYES: Conjunctivae clear, pupils appear equal, EOM appear intact ENT: normal nose; moist mucous  membranes NECK: Supple, normal ROM CARD: RRR; S1 and S2 appreciated; no murmurs, no clicks, no rubs, no gallops RESP: Normal chest excursion without splinting or tachypnea; breath sounds clear and equal bilaterally; no wheezes, no rhonchi, no rales, no hypoxia or respiratory distress, speaking full sentences ABD/GI: Normal bowel sounds; non-distended; soft, non-tender, no rebound, no guarding, no peritoneal signs, no hepatosplenomegaly BACK:  The back appears normal EXT: Normal ROM in all joints; no deformity noted, no edema; no cyanosis SKIN: Normal color for age and race; warm; no rash on exposed skin NEURO: Moves all extremities equally PSYCH: Patient is  fidgety throughout history.  He makes poor eye contact.  He has rapid, pressured speech with disorganized thought process.  He has flight of ideas.  He does endorse thoughts of wanting to harm himself.  He appears paranoid.  Reports auditory hallucinations.  Appears to have poor insight.  MEDICAL DECISION MAKING: Patient here with psychosis.  I feel he needs inpatient psychiatric treatment.  Labs here are unremarkable.  Depakote level pending.  Alcohol level negative.  Drug screen negative.  Does not appear intoxicated.  I feel patient is a danger to himself and others.  States initially he was very concerned about the symptoms but states now he thinks they are funny.  I am concerned that at some point he will request to leave and therefore have placed him under full IVC.  Home medications have been reordered.  Will consult TTS.   ED PROGRESS: 7:25 AM  Depakote is therapeutic.  Patient medically cleared.  Awaiting TTS evaluation.  The patient has been placed in psychiatric observation due to the need to provide a safe environment for the patient while obtaining psychiatric consultation and evaluation, as well as ongoing medical and medication management to treat the patient's condition.  The patient has been placed under full IVC at this time.    Albert [name] was evaluated in Emergency Department on 01/30/2020 for the symptoms described in the history of present illness. He was evaluated in the context of the global COVID-19 pandemic, which necessitated consideration that the patient might be at risk for infection with the SARS-CoV-2 virus that causes COVID-19. Institutional protocols and algorithms that pertain to the evaluation of patients at risk for COVID-19 are in a state of rapid change based on information released by regulatory bodies including the CDC and federal and state organizations. These policies and algorithms were followed during the patient's care in the ED.      Albert [name], Layla Maw, DO 01/30/20 [phone]

## 2020-01-30 NOTE — ED Triage Notes (Addendum)
Patient presents stating that he is hearing his upstairs neighbor through the vents and walls speaking to him sexually. Patient states he has talked to the police about this matter with no outcome. Patient states that it is causing him distress and he would like to speak to a counselor. Denies SI/HI. Similar story noted 09/28/2019

## 2020-01-30 NOTE — BH Assessment (Signed)
Assessment Note  Albert [name] is an 44 y.o. male that presents this date delusional with altered mental state. Patient is currently denying any S/I, H/I or AVH. Patient reports ongoing depression, anxiety and paranoia associated with "his upstairs neighbor talking to him through the walls." Patient has a history of schizophrenia and has long psychiatric history, stating he was diagnosed with schizophrenia at age 6. He reports he has felt depressed for the past week and feels extremely anxious. He says the girl who lives above him has been "spying on him" and sending him "subliminal messages." Patient states this has been going on "for some time" and now is "starting to affect his health" with patient reporting ongoing GI issues and chest discomfort. Patient is well known to area providers and has a extensive history of presenting with similar symptoms. Patient was last seen on 01/10/20 when he presented due to the same neighbor "making noises" which kept him awake. Patient is currently receiving OP services from [facility] who assists with ongoing medication management. Patient states he receives monthly injections of Invega reporting he received that medication last on 01/16/20. Patient also reports he has been meeting weekly with his therapist [facility]) and is scheduled to meet with him later this date. Patient states he has been maintaining his sobriety for over four years and is afraid that these recent occurrences may cause him to relapse. He says he is worried he will relapse on alcohol and then become suicidal. Patient denies current suicidal ideation or homicidal ideation. He states he has been sleeping approximately 8 to 10 hours per night. He denies relapsing on alcohol or using any substances. Patient currently lives alone and  identifies his mother and stepfather as his primary supports. Patient has been psychiatrically hospitalized several times at [facility], the most recent March 2021.    Admission note this date states. Patient is a 44 year old male with history of paranoid schizophrenia, previous history of Xanax and alcohol abuse who presents to the emergency department with concerns for psychosis.  He reports over the past few weeks his neighbor Albert [name] has been whispering through the floor at him.  He states she will repeatedly say "I am your nay" and then "ta nay".  When asked what this means he states " I am a part of you."  He states that he has been on masturbating to this.  He states that she tells him to feel ashamed regarding the masturbating.  He states that all of this started after he began looking up sex trafficking on the Internet.  He states he thought about this after a friend took him to a class about sex trafficking.  When asked if he has had thoughts of wanting to harm himself, patient states that he thinks about getting on his scooter that "goes really fast" and falling off on a "bumpy road".  He denies hearing voices telling him to hurt himself or others but states the voices make him feel "resentful" and make him feel like he is going to drink again.  States he has been sober for 4 years.  Denies drug or alcohol use.  Patient is dressed in scrubs and oriented x4. Patient speaks in a clear tone, at moderate volume and normal pace. Motor behavior appears normal. Eye contact is good. Patient's mood is anxious and affect is congruent with mood. Thought process is coherent and relevant. There is no indication that he is currently responding to internal stimuli but he is experiencing delusional  content.    Diagnosis: F29.0 Schizophrenia  Past Medical History:  Past Medical History:  Diagnosis Date  . Alcoholism ([place])   . Drug abuse ([place])    xanax addiction 5 years ago  . Medical non-compliance   . Paranoid schizophrenia ([place])   . Schizophrenia ([place])   . Seizures ([place])     History reviewed. No pertinent surgical history.  Family History:  Family History   Problem Relation Age of Onset  . Thyroid disease Mother   . Depression Maternal Aunt     Social History:  reports that he has never smoked. He has never used smokeless tobacco. He reports previous alcohol use. He reports previous drug use. Drug: Marijuana.  Additional Social History:  Alcohol / Drug Use Pain Medications: See MAR Prescriptions: See MAR Over the Counter: See MAR History of alcohol / drug use?: Yes Longest period of sobriety (when/how long): Current 4 years Negative Consequences of Use: (Denies) Withdrawal Symptoms: (Denies) Substance #1 Name of Substance 1: Alcohol 1 - Age of First Use: 21 1 - Amount (size/oz): Varies 1 - Frequency: Varies 1 - Duration: Currently maintaining her sobriety 1 - Last Use / Amount: Pt states he is maintaining his sobriety for 4 years  CIWA: CIWA-Ar BP: (!) 152/100 Pulse Rate: 83 COWS:    Allergies: No Known Allergies  Home Medications: (Not in a hospital admission)   OB/GYN Status:  No LMP for male patient.  General Assessment Data [facility] of Assessment: [facility] TTS Assessment: In system Is this a Tele or Face-to-Face Assessment?: Face-to-Face Is this an Initial Assessment or a Re-assessment for this encounter?: Initial Assessment Patient Accompanied by:: N/A Language Other than English: No Living Arrangements: Other (Comment)(Lives alone) What gender do you identify as?: Male Marital status: Single Maiden name: NA Pregnancy Status: No Living Arrangements: Alone Can pt return to current living arrangement?: Yes Admission Status: Involuntary Is patient capable of signing voluntary admission?: Yes Referral Source: Self/Family/Friend Insurance type: Medicare     Crisis Care Plan Living Arrangements: Alone Legal Guardian: (Self) Name of Psychiatrist: Reece Levy MD Name of Therapist: Windle Guard  Education Status Is patient currently in school?: No Is the patient employed, unemployed or receiving disability?: Receiving  disability income  Risk to self with the past 6 months Suicidal Ideation: No Has patient been a risk to self within the past 6 months prior to admission? : No Suicidal Intent: No Has patient had any suicidal intent within the past 6 months prior to admission? : No Is patient at risk for suicide?: No Suicidal Plan?: No Has patient had any suicidal plan within the past 6 months prior to admission? : No Access to Means: No What has been your use of drugs/alcohol within the last 12 months?: Denies curently maintaining sobriety Previous Attempts/Gestures: No How many times?: 0 Other Self Harm Risks: None Triggers for Past Attempts: None known Intentional Self Injurious Behavior: None Family Suicide History: No Recent stressful life event(s): Other (Comment)(Increased AVH) Persecutory voices/beliefs?: Yes Depression: Yes Depression Symptoms: Guilt, Feeling worthless/self pity Substance abuse history and/or treatment for substance abuse?: Yes Suicide prevention information given to non-admitted patients: Not applicable  Risk to Others within the past 6 months Homicidal Ideation: No Does patient have any lifetime risk of violence toward others beyond the six months prior to admission? : No Thoughts of Harm to Others: No Current Homicidal Intent: No Current Homicidal Plan: No Access to Homicidal Means: No Identified Victim: None History of harm to others?: No Assessment  of Violence: None Noted Violent Behavior Description: None Does patient have access to weapons?: No Criminal Charges Pending?: No Does patient have a court date: No Is patient on probation?: No  Psychosis Hallucinations: None noted Delusions: Persecutory  Mental Status Report Appearance/Hygiene: Unremarkable Eye Contact: Good Motor Activity: Freedom of movement Speech: Logical/coherent Level of Consciousness: Alert Mood: Anxious Affect: Appropriate to circumstance Anxiety Level: Moderate Thought Processes:  Coherent Judgement: Partial Orientation: Person, Place, Time Obsessive Compulsive Thoughts/Behaviors: None  Cognitive Functioning Concentration: Normal Memory: Recent Intact, Remote Intact Is patient IDD: No Insight: Fair Impulse Control: Fair Appetite: Good Have you had any weight changes? : No Change Sleep: Increased  ADLScreening [facility] Assessment Services) Patient's cognitive ability adequate to safely complete daily activities?: Yes Patient able to express need for assistance with ADLs?: Yes Independently performs ADLs?: Yes (appropriate for developmental age)  Prior Inpatient Therapy Prior Inpatient Therapy: Yes Prior Therapy Dates: 2020 Multiple Prior Therapy Facilty/Provider(s): [facility], [facility] Reason for Treatment: MH issues  Prior Outpatient Therapy Prior Outpatient Therapy: Yes Prior Therapy Dates: Ongoing Prior Therapy Facilty/Provider(s): Betti Cruz MD Reason for Treatment: Med mang Does patient have an ACCT team?: No Does patient have Intensive In-House Services?  : No Does patient have [facility] services? : No Does patient have P4CC services?: No  ADL Screening (condition at time of admission) Patient's cognitive ability adequate to safely complete daily activities?: Yes Is the patient deaf or have difficulty hearing?: No Does the patient have difficulty seeing, even when wearing glasses/contacts?: No Does the patient have difficulty concentrating, remembering, or making decisions?: No Patient able to express need for assistance with ADLs?: Yes Does the patient have difficulty dressing or bathing?: No Independently performs ADLs?: Yes (appropriate for developmental age) Does the patient have difficulty walking or climbing stairs?: No Weakness of Legs: None Weakness of Arms/Hands: None  Home Assistive Devices/Equipment Home Assistive Devices/Equipment: None  Therapy Consults (therapy consults require a physician order) PT Evaluation Needed: No OT Evalulation  Needed: No SLP Evaluation Needed: No Abuse/Neglect Assessment (Assessment to be complete while patient is alone) Physical Abuse: Denies Verbal Abuse: Denies Sexual Abuse: Denies Exploitation of patient/patient's resources: Denies Self-Neglect: Denies Values / Beliefs Cultural Requests During Hospitalization: None Spiritual Requests During Hospitalization: None Consults Spiritual Care Consult Needed: No Transition of Care Team Consult Needed: No Advance Directives (For Healthcare) Does Patient Have a Medical Advance Directive?: No Would patient like information on creating a medical advance directive?: No - Patient declined          Disposition:  Disposition Initial Assessment Completed for this Encounter: Yes  On Site Evaluation by:   Reviewed with Physician:    Alfredia Ferguson 01/30/2020 9:21 AM

## 2020-01-30 NOTE — Discharge Instructions (Signed)
Keep Scheduled virtual appoint with therapist today 01/30/20

## 2020-01-30 NOTE — BHH Suicide Risk Assessment (Cosign Needed)
Suicide Risk Assessment  Discharge Assessment   [facility] Discharge Suicide Risk Assessment   Principal Problem: <principal problem not specified> Discharge Diagnoses: Active Problems:   * No active hospital problems. *   Total Time spent with patient: 30 minutes  Musculoskeletal: Strength & Muscle Tone: within normal limits Gait & Station: normal Patient leans: N/A  Psychiatric Specialty Exam:   Blood pressure (!) 152/100, pulse 83, temperature 97.6 F (36.4 C), temperature source Oral, resp. rate 20, SpO2 96 %.There is no height or weight on file to calculate BMI.  General Appearance: Casual  Eye Contact::  Good  Speech:  Clear and Coherent and Normal Rate409  Volume:  Normal  Mood:  Anxious  Affect:  Appropriate and Congruent  Thought Process:  Coherent, Goal Directed and Descriptions of Associations: Intact  Orientation:  Full (Time, Place, and Person)  Thought Content:  WDL  Suicidal Thoughts:  No  Homicidal Thoughts:  No  Memory:  Immediate;   Good Recent;   Good  Judgement:  Intact  Insight:  Present  Psychomotor Activity:  Normal  Concentration:  Good  Recall:  Good  Fund of Knowledge:Fair  Language: Good  Akathisia:  No  Handed:  Right  AIMS (if indicated):     Assets:  Communication Skills Desire for Improvement Housing Social Support  Sleep:     Cognition: WNL  ADL's:  Intact   Mental Status Per Nursing Assessment::   On Admission:     Demographic Factors:  Male, Caucasian and Living alone  Loss Factors: NA  Historical Factors: Impulsivity  Risk Reduction Factors:   Religious beliefs about death, Positive social support and Positive therapeutic relationship  Continued Clinical Symptoms:  Previous Psychiatric Diagnoses and Treatments  Cognitive Features That Contribute To Risk:  None    Suicide Risk:  Minimal: No identifiable suicidal ideation.  Patients presenting with no risk factors but with morbid ruminations; may be classified as  minimal risk based on the severity of the depressive symptoms   Plan Of Care/Follow-up recommendations:  Activity:  As tolerated Diet:  Heart healthy     Discharge Instructions     Keep Scheduled virtual appoint with therapist today 01/30/20    Follow-up Information    [facility]. Schedule an appointment as soon as possible for a visit in 6 day(s).   Specialty: Behavioral Health Why: Schedule follow up appointment  Contact information: [address] [place] [address] [phone]          Disposition:  Psychiatrically cleared No evidence of imminent risk to self or others at present.   Patient does not meet criteria for psychiatric inpatient admission. Supportive therapy provided about ongoing stressors. Discussed crisis plan, support from social network, calling 911, coming to the Emergency Department, and calling Suicide Hotline.  Takerra Lupinacci, NP 01/30/2020, 10:27 AM

## 2020-02-04 ENCOUNTER — Ambulatory Visit: Payer: Self-pay

## 2020-12-16 ENCOUNTER — Encounter (HOSPITAL_COMMUNITY): Payer: Self-pay

## 2020-12-16 ENCOUNTER — Emergency Department (HOSPITAL_COMMUNITY)
Admission: EM | Admit: 2020-12-16 | Discharge: 2020-12-17 | Disposition: A | Discharge status: Home / Self Care | Payer: Medicare HMO | Attending: Emergency Medicine | Admitting: Emergency Medicine

## 2020-12-16 ENCOUNTER — Other Ambulatory Visit: Payer: Self-pay

## 2020-12-16 DIAGNOSIS — Z20822 Contact with and (suspected) exposure to covid-19: Secondary | ICD-10-CM | POA: Insufficient documentation

## 2020-12-16 DIAGNOSIS — F250 Schizoaffective disorder, bipolar type: Secondary | ICD-10-CM | POA: Insufficient documentation

## 2020-12-16 DIAGNOSIS — F22 Delusional disorders: Secondary | ICD-10-CM

## 2020-12-16 DIAGNOSIS — Z79899 Other long term (current) drug therapy: Secondary | ICD-10-CM | POA: Diagnosis not present

## 2020-12-16 DIAGNOSIS — F209 Schizophrenia, unspecified: Secondary | ICD-10-CM | POA: Insufficient documentation

## 2020-12-16 DIAGNOSIS — F29 Unspecified psychosis not due to a substance or known physiological condition: Secondary | ICD-10-CM | POA: Diagnosis present

## 2020-12-16 DIAGNOSIS — R443 Hallucinations, unspecified: Secondary | ICD-10-CM | POA: Diagnosis present

## 2020-12-16 LAB — COMPREHENSIVE METABOLIC PANEL
ALT: 35 U/L (ref 0–44)
AST: 32 U/L (ref 15–41)
Albumin: 4.2 g/dL (ref 3.5–5.0)
Alkaline Phosphatase: 65 U/L (ref 38–126)
Anion gap: 12 (ref 5–15)
BUN: 16 mg/dL (ref 6–20)
CO2: 21 mmol/L — ABNORMAL LOW (ref 22–32)
Calcium: 8.9 mg/dL (ref 8.9–10.3)
Chloride: 106 mmol/L (ref 98–111)
Creatinine, Ser: 0.85 mg/dL (ref 0.61–1.24)
GFR, Estimated: >60 mL/min (ref >60)
Glucose, Bld: 115 mg/dL — ABNORMAL HIGH (ref 70–99)
Potassium: 3.6 mmol/L (ref 3.5–5.1)
Sodium: 139 mmol/L (ref 135–145)
Total Bilirubin: 0.5 mg/dL (ref 0.3–1.2)
Total Protein: 7.8 g/dL (ref 6.5–8.1)

## 2020-12-16 LAB — CBC WITH DIFFERENTIAL/PLATELET
Abs Immature Granulocytes: 0.03 10*3/uL (ref 0.00–0.07)
Basophils Absolute: 0.1 10*3/uL (ref 0.0–0.1)
Basophils Relative: 1 %
Eosinophils Absolute: 0.1 10*3/uL (ref 0.0–0.5)
Eosinophils Relative: 2 %
HCT: 40.5 % (ref 39.0–52.0)
Hemoglobin: 13.9 g/dL (ref 13.0–17.0)
Immature Granulocytes: 0 %
Lymphocytes Relative: 52 %
Lymphs Abs: 3.4 10*3/uL (ref 0.7–4.0)
MCH: 31.7 pg (ref 26.0–34.0)
MCHC: 34.3 g/dL (ref 30.0–36.0)
MCV: 92.5 fL (ref 80.0–100.0)
Monocytes Absolute: 0.6 10*3/uL (ref 0.1–1.0)
Monocytes Relative: 9 %
Neutro Abs: 2.4 10*3/uL (ref 1.7–7.7)
Neutrophils Relative %: 36 %
Platelets: 279 10*3/uL (ref 150–400)
RBC: 4.38 MIL/uL (ref 4.22–5.81)
RDW: 14.1 % (ref 11.5–15.5)
WBC: 6.7 10*3/uL (ref 4.0–10.5)
nRBC: 0 % (ref 0.0–0.2)

## 2020-12-16 LAB — RAPID URINE DRUG SCREEN, HOSP PERFORMED
Amphetamines: NOT DETECTED
Barbiturates: NOT DETECTED
Benzodiazepines: NOT DETECTED
Cocaine: NOT DETECTED
Opiates: NOT DETECTED
Tetrahydrocannabinol: NOT DETECTED

## 2020-12-16 LAB — RESP PANEL BY RT-PCR (FLU A&B, COVID) ARPGX2
Influenza A by PCR: NEGATIVE
Influenza B by PCR: NEGATIVE
SARS Coronavirus 2 by RT PCR: NEGATIVE

## 2020-12-16 LAB — ETHANOL: Alcohol, Ethyl (B): <10 mg/dL (ref <10)

## 2020-12-16 MED ORDER — DIVALPROEX SODIUM ER 500 MG PO TB24
1500.0000 mg | ORAL_TABLET | Freq: Every day | ORAL | Status: DC
  Administered 2020-12-16: 1500 mg via ORAL
  Filled 2020-12-16: qty 3

## 2020-12-16 MED ORDER — QUETIAPINE FUMARATE 300 MG PO TABS
300.0000 mg | ORAL_TABLET | Freq: Every day | ORAL | Status: DC
  Administered 2020-12-16: 300 mg via ORAL
  Filled 2020-12-16: qty 1

## 2020-12-16 MED ORDER — QUETIAPINE FUMARATE 300 MG PO TABS: 400.0000 mg | ORAL_TABLET | Freq: Every day | ORAL | Status: DC

## 2020-12-16 MED ORDER — DIVALPROEX SODIUM 500 MG PO DR TAB: 1500.0000 mg | DELAYED_RELEASE_TABLET | Freq: Every day | ORAL | Status: DC

## 2020-12-16 MED ORDER — PANTOPRAZOLE SODIUM 40 MG PO TBEC
40.0000 mg | DELAYED_RELEASE_TABLET | Freq: Two times a day (BID) | ORAL | Status: DC
  Administered 2020-12-16 – 2020-12-17 (×3): 40 mg via ORAL
  Filled 2020-12-16 (×3): qty 1

## 2020-12-16 NOTE — ED Notes (Signed)
Patients black back pack and one bag of belongings including blue shirt, jeans, 2 gray shoes in cabinets at the nurse station 9-12. Patient changed out into burgundy scrubs.

## 2020-12-16 NOTE — BH Assessment (Addendum)
Received call from Tyhee at [facility].  Patient has been accepted to [facility].  Accepting is Dr Betti Cruz.  Call [phone] or [phone] for report.  Patient can arrive after 9 am on 12/17/20.  Dr. Lockie Mola (EDP) and Caprice Renshaw (TTS) notified.

## 2020-12-16 NOTE — ED Notes (Signed)
Patient accepted to [facility] and can arrive tomorrow after 9am.

## 2020-12-16 NOTE — ED Triage Notes (Signed)
Patient BIB [facility] coming from home. Having auditory and visual hallucinations. History of bipolar and schizophrenia. Patient said he took his medications around 5 this morning. He said he has been having issues with the neighbors above saying that his computer is telling him to take more medications. He is being told "I am your new Meta" by the voices. Patient is not suicidal or homicidal with paranoia and anxiety.

## 2020-12-16 NOTE — Consult Note (Signed)
  Patient briefly evaluated by psychiatry provider along with TTS counselor. Patient alert and oriented, actively participates in assessment.  Patient endorses apparent paranoid delusion surrounding new neighbors who moved into his building last week.  Patient reports he believes neighbors are "controlling his body through the computer."  Patient endorses sensation that his body and head are swelling and he feels that his home now has an "weird atmosphere." Patient has been diagnosed with schizoaffective disorder, bipolar type in the past.  Patient reports he is currently followed outpatient by Dr. Betti Cruz and reports compliance with current medications including Depakote and Seroquel.  Restart home medications. Medications: -Depakote ER 1500 mg nightly -Seroquel 300 mg nightly  Patient reviewed with Dr. Bronwen Betters. Valproic acid level and EKG ordered.  Currently patient requires inpatient psychiatric treatment.  Patient agrees with this plan.  Patient remains voluntary at this time.  Disposition currently seeking inpatient psychiatric placement.

## 2020-12-16 NOTE — BH Assessment (Signed)
BHH Assessment Progress Note   Per Berneice Heinrich, NP, this pt requires psychiatric hospitalization at this time.  Pt presents under IVC initiated by EDP Kennis Carina, MD.  The following facilities have been contacted to seek placement for this pt, with results as noted:  Beds available, information sent, decision pending: [facility] Busing [facility] [place]  Unable to reach: Earlene Plater (left message at 16:08) [employer] (left message at 16:12)  At capacity: [facility] [place]    Doylene Canning, [place] Behavioral Health Coordinator [phone]

## 2020-12-16 NOTE — BH Assessment (Signed)
Comprehensive Clinical Assessment (CCA) Note  12/16/2020 Albert [name] [id number] Patient presents this date delusional with altered mental state. Patient is currently denying any S/I, H/I or AVH. Patient reports ongoing paranoia, depression and anxiety associated with "his upstairs neighbor corrupting his computer." Patient has a history of schizophrenia and was last seen on 01/30/20 when he presented with similar symptoms. Patient states he was diagnosed with schizophrenia at age 50 and is currently receiving OP services from [facility] MD who assists with medication management for ongoing symptoms. Patient is well known to area providers and has a extensive history of presenting with similar symptoms. Patient states he is currently compliant with his medication regimen. Patient also reports he has been meeting weekly with his therapist [facility]) and is scheduled to meet with him later this month. Patient states he has been maintaining his sobriety for over five years now and states he has a sponsor. Patient denies any current SA issues. Patient currently lives alone and  identifies his mother and stepfather as his primary supports. Patient has been psychiatrically hospitalized several times at [facility], the most recent March 2021.Patient denies any history of abuse or access to firearms.   Per admission note this date Bero MD writes: Albert [name] is a 45 y.o. year-old male with a history of paranoid schizophrenia presenting to the ED with chief complaint of psychiatric evaluation.  Patient endorsing having issues with his neighbors. Feels that the upstairs neighbor is controlling him somehow with a computer. He states that his insides feel very "computer". He is acutely psychotic and not making much sense.  Paranoid delusions.  Patient is dressed in scrubs and oriented x4. Patient speaks in a clear tone, at moderate volume and normal pace. Motor behavior appears normal. Eye contact is good.  Patient's mood is anxiousand affect is congruent with mood. Thought process is coherent and relevant. There is no indication that heis currently responding to internal stimuli but he is experiencing delusional content although patient contnues to voice suspicion and paranoia over his upstairs neighbors "being inside his computer." Arlana Pouch NP recommends a inpatient admission to assist with stabilization. IVC is also currently being initiated.      Chief Complaint:  Chief Complaint  Patient presents with  . Psychiatric Evaluation  . Hallucinations   Visit Diagnosis: Schizophrenia     CCA Screening, Triage and Referral (STR)  Patient Reported Information How did you hear about [place]? Self  Referral name: No data recorded Referral phone number: No data recorded  Whom do you see for routine medical problems? I don't have a doctor  Practice/Facility Name: No data recorded Practice/Facility Phone Number: No data recorded Name of Contact: No data recorded Contact Number: No data recorded Contact Fax Number: No data recorded Prescriber Name: No data recorded Prescriber Address (if known): No data recorded  What Is the Reason for Your Visit/Call Today? Paranoia  How Long Has This Been Causing You Problems? 1 wk - 1 month  What Do You Feel Would Help You the Most Today? Other (Comment) (Inpatient admission)   Have You Recently Been in Any Inpatient Treatment (Hospital/Detox/Crisis Center/28-Day Program)? No  Name/Location of Program/Hospital:No data recorded How Long Were You There? No data recorded When Were You Discharged? No data recorded  Have You Ever Received Services From [facility] Before? Yes  Who Do You See at [facility]? last assessed in 2021   Have You Recently Had Any Thoughts About Hurting Yourself? No  Are You Planning to Commit Suicide/Harm  Yourself At This time? No   Have you Recently Had Thoughts About Hurting Someone [name]? No  Explanation: No data  recorded  Have You Used Any Alcohol or Drugs in the Past 24 Hours? No  How Long Ago Did You Use Drugs or Alcohol? No data recorded What Did You Use and How Much? No data recorded  Do You Currently Have a Therapist/Psychiatrist? Yes  Name of Therapist/Psychiatrist: Betti Cruz MD   Have You Been Recently Discharged From Any Office Practice or Programs? No  Explanation of Discharge From Practice/Program: No data recorded    CCA Screening Triage Referral Assessment Type of Contact: Face-to-Face  Is this Initial or Reassessment? No data recorded Date Telepsych consult ordered in [facility]:  No data recorded Time Telepsych consult ordered in [facility]:  No data recorded  Patient Reported Information Reviewed? Yes  Patient Left Without Being Seen? No data recorded Reason for Not Completing Assessment: No data recorded  Collateral Involvement: No data recorded  Does Patient Have a Court Appointed Legal Guardian? No data recorded Name and Contact of Legal Guardian: Self  If Minor and Not Living with Parent(s), Who has Custody? NA  Is CPS involved or ever been involved? Never  Is APS involved or ever been involved? Never   Patient Determined To Be At Risk for Harm To Self or Others Based on Review of Patient Reported Information or Presenting Complaint? No  Method: No data recorded Availability of Means: No data recorded Intent: No data recorded Notification Required: No data recorded Additional Information for Danger to Others Potential: No data recorded Additional Comments for Danger to Others Potential: No data recorded Are There Guns or Other Weapons in Your Home? No data recorded Types of Guns/Weapons: No data recorded Are These Weapons Safely Secured?                            No data recorded Who Could Verify You Are Able To Have These Secured: No data recorded Do You Have any Outstanding Charges, Pending Court Dates, Parole/Probation? No data recorded Contacted To Inform of Risk  of Harm To Self or Others: Other: Comment (NA)   [facility]: [facility]   Does Patient Present under Involuntary Commitment? Yes  IVC Papers Initial File Date: 12/16/2020   [place] of Residence: [place]   Patient Currently Receiving the Following Services: Medication Management   Determination of Need: -- (Inpatient)   Options For Referral: Outpatient Therapy     CCA Biopsychosocial Intake/Chief Complaint:  No data recorded Current Symptoms/Problems: No data recorded  Patient Reported Schizophrenia/Schizoaffective Diagnosis in Past: Yes   Strengths: No data recorded Preferences: No data recorded Abilities: No data recorded  Type of Services Patient Feels are Needed: No data recorded  Initial Clinical Notes/Concerns: No data recorded  Mental Health Symptoms Depression:  Change in energy/activity   Duration of Depressive symptoms: No data recorded  Mania:  Change in energy/activity   Anxiety:   Worrying   Psychosis:  Affective flattening/alogia/avolition   Duration of Psychotic symptoms: Greater than six months   Trauma:  None   Obsessions:  None   Compulsions:  None   Inattention:  None   Hyperactivity/Impulsivity:  N/A   Oppositional/Defiant Behaviors:  None   Emotional Irregularity:  None   Other Mood/Personality Symptoms:  No data recorded   Mental Status Exam Appearance and self-care  Stature:  Average   Weight:  Average weight   Clothing:  Neat/clean  Grooming:  Normal   Cosmetic use:  None   Posture/gait:  Normal   Motor activity:  Not Remarkable   Sensorium  Attention:  Distractible   Concentration:  Anxiety interferes   Orientation:  X5   Recall/memory:  Normal   Affect and Mood  Affect:  Appropriate   Mood:  Anxious   Relating  Eye contact:  Normal   Facial expression:  Anxious   Attitude toward examiner:  Cooperative   Thought and Language  Speech flow: Normal   Thought content:  Appropriate to  Mood and Circumstances   Preoccupation:  -- (NA)   Hallucinations:  None   Organization:  No data recorded  [employer] of Knowledge:  Fair   Intelligence:  No data recorded  Abstraction:  Abstract   Judgement:  Fair   Dance movement psychotherapist:  Realistic   Insight:  Fair   Decision Making:  No data recorded  Social Functioning  Social Maturity:  Responsible   Social Judgement:  Normal   Stress  Stressors:  Family conflict   Coping Ability:  Normal   Skill Deficits:  Activities of daily living   Supports:  Usual     Religion: Religion/Spirituality Are You A Religious Person?: No  Leisure/Recreation: Leisure / Recreation Do You Have Hobbies?: No  Exercise/Diet: Exercise/Diet Do You Exercise?: No Have You Gained or Lost A Significant Amount of Weight in the Past Six Months?: No Do You Follow a Special Diet?: No Do You Have Any Trouble Sleeping?: No   CCA Employment/Education Employment/Work Situation: Employment / Work Psychologist, occupational Employment situation: Unemployed  Education:     CCA Family/Childhood History Family and Relationship History: Family history Marital status: Single  Childhood History:  Childhood History Did patient suffer any verbal/emotional/physical/sexual abuse as a child?: No Did patient suffer from severe childhood neglect?: No Has patient ever been sexually abused/assaulted/raped as an adolescent or adult?: No Was the patient ever a victim of a crime or a disaster?: No Witnessed domestic violence?: No Has patient been affected by domestic violence as an adult?: No  Child/Adolescent Assessment:     CCA Substance Use Alcohol/Drug Use:                           ASAM's:  Six Dimensions of Multidimensional Assessment  Dimension 1:  Acute Intoxication and/or Withdrawal Potential:      Dimension 2:  Biomedical Conditions and Complications:      Dimension 3:  Emotional, Behavioral, or Cognitive Conditions  and Complications:     Dimension 4:  Readiness to Change:     Dimension 5:  Relapse, Continued use, or Continued Problem Potential:     Dimension 6:  Recovery/Living Environment:     ASAM Severity Score:    ASAM Recommended Level of Treatment:     Substance use Disorder (SUD)    Recommendations for Services/Supports/Treatments:    DSM5 Diagnoses: Patient Active Problem List   Diagnosis Date Noted  . Adjustment disorder with disturbance of emotion 01/11/2020  . Schizophrenia (HCC) 09/01/2019  . Bipolar disorder (HCC) 01/24/2019  . Schizoaffective disorder (HCC) 01/23/2019  . Paranoid schizophrenia (HCC) 06/05/2018  . Alcohol use disorder, moderate, in sustained remission (HCC) 07/09/2017  . Cannabis use disorder, moderate, in sustained remission (HCC) 07/09/2017  . Elevated TSH 01/05/2017  . Tachycardia 01/05/2017  . Schizoaffective disorder, bipolar type (HCC) 01/04/2017  . Alcoholism (HCC)   . Drug abuse (HCC)   . Medical  non-compliance     Patient Centered Plan: Patient is on the following Treatment Plan(s):  Referrals to Alternative Service(s): Referred to Alternative Service(s):   Place:   Date:   Time:    Referred to Alternative Service(s):   Place:   Date:   Time:    Referred to Alternative Service(s):   Place:   Date:   Time:    Referred to Alternative Service(s):   Place:   Date:   Time:     Alfredia FergusonDavid L Jabier Deese, LCAS

## 2020-12-16 NOTE — ED Provider Notes (Signed)
[facility]-EMERGENCY DEPT [facility] Emergency Department Provider Note MRN:  [id number]  Arrival date & time: 12/16/20     Chief Complaint   Psychiatric Evaluation and Hallucinations   History of Present Illness   Albert [name] is a 45 y.o. year-old male with a history of paranoid schizophrenia presenting to the ED with chief complaint of psychiatric evaluation.  Patient endorsing having issues with his neighbors.  Feels that the upstairs neighbor is controlling him somehow with a computer.  He states that his insides feel very "computer".  He is acutely psychotic and not making much sense.  Paranoid delusions.  I was unable to obtain an accurate HPI, PMH, or ROS due to the patient's unstable psychiatric condition.  Level 5 caveat.  Review of Systems  Positive for hallucinations, paranoid delusions.  Patient's Health History    Past Medical History:  Diagnosis Date  . Alcoholism (HCC)   . Drug abuse (HCC)    xanax addiction 5 years ago  . Medical non-compliance   . Paranoid schizophrenia (HCC)   . Schizophrenia (HCC)   . Seizures (HCC)     History reviewed. No pertinent surgical history.  Family History  Problem Relation Age of Onset  . Thyroid disease Mother   . Depression Maternal Aunt     Social History   Socioeconomic History  . Marital status: Single    Spouse name: Not on file  . Number of children: Not on file  . Years of education: Not on file  . Highest education level: Not on file  Occupational History    Comment: Unclear  Tobacco Use  . Smoking status: Never Smoker  . Smokeless tobacco: Never Used  . Tobacco comment: Non-smoker  Vaping Use  . Vaping Use: Never used  Substance and Sexual Activity  . Alcohol use: Not Currently  . Drug use: Not Currently    Types: Marijuana    Comment: xanax- several years ago  . Sexual activity: Not Currently  Other Topics Concern  . Not on file  Social History Narrative   ** Merged History Encounter **     Pt reported that he lives in a house apartment; he is followed by Dr. Betti Cruz.  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   Social Determinants of Health   Financial Resource Strain: Not on file  Food Insecurity: Not on file  Transportation Needs: Not on file  Physical Activity: Not on file  Stress: Not on file  Social Connections: Not on file  Intimate Partner Violence: Not on file     Physical Exam   Vitals:   12/16/20 0544  BP: (!) 151/100  Pulse: (!) 111  Resp: 18  Temp: 97.9 F (36.6 C)  SpO2: 98%    CONSTITUTIONAL: Well-appearing, NAD NEURO:  Alert and oriented x 3, no focal deficits EYES:  eyes equal and reactive ENT/NECK:  no LAD, no JVD CARDIO: Regular rate, well-perfused, normal S1 and S2 PULM:  CTAB no wheezing or rhonchi GI/GU:  normal bowel sounds, non-distended, non-tender MSK/SPINE:  No gross deformities, no edema SKIN:  no rash, atraumatic PSYCH: Bizarre and difficult to follow speech  *Additional and/or pertinent findings included in MDM below  Diagnostic and Interventional Summary    EKG Interpretation  Date/Time:    Ventricular Rate:    PR Interval:    QRS Duration:   QT Interval:    QTC Calculation:   R Axis:     Text Interpretation:  Labs Reviewed  RESP PANEL BY RT-PCR (FLU A&B, COVID) ARPGX2  CBC WITH DIFFERENTIAL/PLATELET  COMPREHENSIVE METABOLIC PANEL  ETHANOL  RAPID URINE DRUG SCREEN, HOSP PERFORMED    No orders to display    Medications - No data to display   Procedures  /  Critical Care Procedures  ED Course and Medical Decision Making  I have reviewed the triage vital signs, the nursing notes, and pertinent available records from the EMR.  Listed above are laboratory and imaging tests that I personally ordered, reviewed, and interpreted and then considered in my medical decision making (see below for details).  Concern for unstable paranoid schizophrenia with  worsening paranoid delusions and hallucinations.  Getting very worked up about his neighbors, and there is some concern that he would cause harm to self or others because of his unstable condition.  IVC paperwork complete, awaiting med clearance and then will consult TTS.  Signed out to oncoming provider at shift change.       Elmer Sow. Pilar Plate, MD [facility] [email or web address]  Final Clinical Impressions(s) / ED Diagnoses     ICD-10-CM   1. Paranoid delusion (HCC)  F22     ED Discharge Orders    None       Discharge Instructions Discussed with and Provided to Patient:   Discharge Instructions   None       Sabas Sous, MD 12/16/20 [phone]

## 2020-12-16 NOTE — ED Notes (Signed)
Urinal at bedside. Asked patient to provide a sample.

## 2020-12-16 NOTE — ED Provider Notes (Signed)
Patient accepted with Dr. Betti Cruz.  Can leave tomorrow after 9:00 am.    [name], DO 12/16/20 2227

## 2020-12-16 NOTE — ED Notes (Signed)
Pt was dressed out in burgundy scrubs and yellow socks. Pt has a black book bag and a white pt belongings bag in the cabinet labeled, " Patient Belongings [address]".

## 2020-12-16 NOTE — BH Assessment (Signed)
Per Burman Riis, RN, she received a call from [place] at [facility]. Pt has been accepted to [facility]. Accepting physician is Dr. Betti Cruz. Report: [phone] or [phone]. Pt can arrive after 9am on 12/17/2020.   Redmond Pulling, MS, [facility], [facility] Triage Specialist [phone]

## 2020-12-16 NOTE — ED Notes (Signed)
Pt given lunch tray.

## 2020-12-17 DIAGNOSIS — F250 Schizoaffective disorder, bipolar type: Secondary | ICD-10-CM | POA: Diagnosis not present

## 2020-12-17 NOTE — ED Notes (Signed)
Confirmed with pt he has one belonging bag and a backpack. Gave these items to officer. Provided EMTALA, TRXMEDNECESS, H&P, medication list, and facesheet for [facility].

## 2020-12-17 NOTE — ED Provider Notes (Signed)
11:25 AM Patient has been accepted [facility] and will be transferred this morning.  Accepting surgeon is Dr. Betti Cruz.  I went to assess the patient he has no complaints at this time.  He ate some breakfast and had a good night sleep he reports.  He agrees with transfer.  He had no chest pain or shortness of breath and his lungs were clear on exam.  Patient will be transferred this morning.      Dilyn Smiles, Canary Brim, MD 12/17/20 1125

## 2020-12-17 NOTE — ED Notes (Signed)
Woke up pt and gave him breakfast. He is calm and cooperative.

## 2020-12-17 NOTE — Care Management (Signed)
Writer informed ED nurse that patient has been accepted to [facility]. Accepting physician is Dr. Betti Cruz. Report: [phone] or [phone]. Pt can arrive after 9am on 12/17/2020.

## 2020-12-17 NOTE — ED Notes (Signed)
Report given to Ethel Rana. at [facility] [phone]. Called [phone] and left message for [facility]. Carlean Purl to transport pt.

## 2020-12-17 NOTE — ED Notes (Signed)
Attempted to call report to [phone]. Albert [name] answered, said the nurse is busy and to call back in 20 minutes.

## 2021-04-17 ENCOUNTER — Encounter: Payer: Self-pay | Admitting: Internal Medicine

## 2021-05-07 ENCOUNTER — Emergency Department (HOSPITAL_COMMUNITY)
Admission: EM | Admit: 2021-05-07 | Discharge: 2021-05-07 | Disposition: A | Discharge status: Home / Self Care | Payer: Medicare HMO | Attending: Emergency Medicine | Admitting: Emergency Medicine

## 2021-05-07 ENCOUNTER — Other Ambulatory Visit: Payer: Self-pay

## 2021-05-07 DIAGNOSIS — U071 COVID-19: Secondary | ICD-10-CM | POA: Diagnosis not present

## 2021-05-07 DIAGNOSIS — J029 Acute pharyngitis, unspecified: Secondary | ICD-10-CM | POA: Diagnosis present

## 2021-05-07 LAB — RESP PANEL BY RT-PCR (FLU A&B, COVID) ARPGX2
Influenza A by PCR: NEGATIVE
Influenza B by PCR: NEGATIVE
SARS Coronavirus 2 by RT PCR: POSITIVE — AB

## 2021-05-07 LAB — GROUP A STREP BY PCR: Group A Strep by PCR: NOT DETECTED

## 2021-05-07 MED ORDER — DEXAMETHASONE SODIUM PHOSPHATE 10 MG/ML IJ SOLN
8.0000 mg | Freq: Once | INTRAMUSCULAR | Status: AC
  Administered 2021-05-07: 8 mg via INTRAMUSCULAR
  Filled 2021-05-07: qty 1

## 2021-05-07 NOTE — ED Triage Notes (Signed)
Pt came in with c/o sore throat and fever. Pt states that he has been taking 800mg  of Ibuprofen and 1000mg  of Tylenol q4h for pain. Afebrile here at 97.4f. Pt states that it started Thursday.

## 2021-05-07 NOTE — Discharge Instructions (Addendum)
You have been diagnosed with COVID-19.  I have attached information on how to quarantine and manage your symptoms.  I recommend a combination of tylenol and ibuprofen for management of your pain. You can take a low dose of both at the same time. I recommend 500 mg of Tylenol combined with 600 mg of ibuprofen. This is one maximum strength Tylenol and three regular ibuprofen. You can take these 2-3 times for day for your pain. Please try to take these medications with a small amount of food as well to prevent upsetting your stomach.  I would also recommend taking Flonase.  This is an over-the-counter medication that you can use in each nostril 1-2 times per day.  This will help with your congestion and sinus pressure.  It is also been shown to help with COVID-19.  Please continue to drink warm drinks such as tea/honey throughout the day to help with your sore throat.  If you develop any new or worsening symptoms please come back to the emergency department immediately for reevaluation.  It was a pleasure to meet you.

## 2021-05-07 NOTE — ED Provider Notes (Signed)
[facility]-EMERGENCY DEPT Provider Note   CSN: [id number] Arrival date & time: 05/07/21  4920     History Chief Complaint  Patient presents with   Sore Throat    Albert [name] is a 45 y.o. male.  HPI Patient is a 45 year old male with a medical history as noted below.  He presents to the emergency department due to sore throat.  Symptoms started about 3 days ago and he states that they are worsening.  Pain worsens when swallowing.  Reports subjective fevers, cough, rhinorrhea.  Denies ear pain, chest pain, shortness of breath, nausea, vomiting.  He has been taking 800 mg ibuprofen and 1000 mg Tylenol every 4 hours for pain.  He states he has been vaccinated for COVID-19 x2 and denies any known history of previous COVID-19 infections.    Past Medical History:  Diagnosis Date   Alcoholism (HCC)    Drug abuse (HCC)    xanax addiction 5 years ago   Medical non-compliance    Paranoid schizophrenia (HCC)    Schizophrenia (HCC)    Seizures (HCC)     Patient Active Problem List   Diagnosis Date Noted   Adjustment disorder with disturbance of emotion 01/11/2020   Schizophrenia (HCC) 09/01/2019   Bipolar disorder (HCC) 01/24/2019   Schizoaffective disorder (HCC) 01/23/2019   Paranoid schizophrenia (HCC) 06/05/2018   Alcohol use disorder, moderate, in sustained remission (HCC) 07/09/2017   Cannabis use disorder, moderate, in sustained remission (HCC) 07/09/2017   Elevated TSH 01/05/2017   Tachycardia 01/05/2017   Schizoaffective disorder, bipolar type (HCC) 01/04/2017   Alcoholism (HCC)    Drug abuse (HCC)    Medical non-compliance     No past surgical history on file.     Family History  Problem Relation Age of Onset   Thyroid disease Mother    Depression Maternal Aunt     Social History   Tobacco Use   Smoking status: Never   Smokeless tobacco: Never   Tobacco comments:    Non-smoker  Vaping Use   Vaping Use: Never used  Substance  Use Topics   Alcohol use: Not Currently   Drug use: Not Currently    Types: Marijuana    Comment: xanax- several years ago    Home Medications Prior to Admission medications   Medication Sig Start Date End Date Taking? Authorizing Provider  benztropine (COGENTIN) 1 MG tablet Take 1 tablet (1 mg total) by mouth 2 (two) times daily. Patient not taking: No sig reported 01/11/20   Rankin, Shuvon B, NP  divalproex (DEPAKOTE ER) 500 MG 24 hr tablet Take 1,500 mg by mouth at bedtime. 11/10/20   [provider]  divalproex (DEPAKOTE) 500 MG DR tablet Take 3 tablets (1,500 mg total) by mouth at bedtime. Patient not taking: Reported on 12/16/2020 01/11/20   Rankin, Shuvon B, NP  paliperidone (INVEGA SUSTENNA) 234 MG/1.5ML SUSY injection Inject 234 mg into the muscle every 30 (thirty) days. (Due on 11-09-19): For mood control 11/09/19   Armandina Stammer I, NP  paliperidone (INVEGA) 9 MG 24 hr tablet Take 1 tablet (9 mg total) by mouth daily. Patient not taking: No sig reported 01/12/20   Rankin, Shuvon B, NP  QUEtiapine (SEROQUEL) 300 MG tablet Take 300 mg by mouth at bedtime. 09/21/20   [provider]  QUEtiapine (SEROQUEL) 400 MG tablet Take 1 tablet (400 mg total) by mouth at bedtime. Patient not taking: Reported on 12/16/2020 01/11/20   Rankin, Rada Hay, NP  Allergies    Patient has no known allergies.  Review of Systems   Review of Systems  Constitutional:  Positive for chills, fatigue and fever.  HENT:  Positive for congestion and sore throat. Negative for ear pain.   Respiratory:  Positive for cough. Negative for shortness of breath.   Cardiovascular:  Negative for chest pain.   Physical Exam Updated Vital Signs BP (!) 158/110 (BP Location: Left Arm)   Pulse (!) 102   Temp (!) 97.4 F (36.3 C) (Oral)   Resp 18   Ht 5\' 10"  (1.778 m)   Wt 97.5 kg   SpO2 98%   BMI 30.85 kg/m   Physical Exam Vitals and nursing note reviewed.  Constitutional:      General: He is not  in acute distress.    Appearance: Normal appearance. He is well-developed. He is not ill-appearing, toxic-appearing or diaphoretic.  HENT:     Head: Normocephalic and atraumatic.     Right Ear: Tympanic membrane, ear canal and external ear normal. No drainage, swelling or tenderness. No middle ear effusion. Tympanic membrane is not erythematous.     Left Ear: Tympanic membrane, ear canal and external ear normal. No drainage, swelling or tenderness.  No middle ear effusion. Tympanic membrane is not erythematous.     Nose: Nose normal.     Mouth/Throat:     Mouth: Mucous membranes are moist. No oral lesions.     Pharynx: Oropharynx is clear. No pharyngeal swelling, oropharyngeal exudate, posterior oropharyngeal erythema or uvula swelling.     Tonsils: No tonsillar exudate or tonsillar abscesses. 0 on the right. 0 on the left.     Comments: No significant erythema noted in the posterior oropharynx.  No exudates.  Uvula midline.  Readily handling secretions.  No hot potato voice. Eyes:     General: No scleral icterus.       Right eye: No discharge.        Left eye: No discharge.     Extraocular Movements: Extraocular movements intact.     Conjunctiva/sclera: Conjunctivae normal.  Neck:     Trachea: No tracheal deviation.  Cardiovascular:     Rate and Rhythm: Normal rate and regular rhythm.     Pulses: Normal pulses.     Heart sounds: Normal heart sounds. No murmur heard.   No friction rub. No gallop.  Pulmonary:     Effort: Pulmonary effort is normal. No respiratory distress.     Breath sounds: Normal breath sounds. No stridor. No wheezing, rhonchi or rales.  Abdominal:     General: Abdomen is flat. There is no distension.     Palpations: Abdomen is soft.     Tenderness: There is no abdominal tenderness.  Musculoskeletal:        General: No swelling or deformity. Normal range of motion.     Cervical back: Normal range of motion and neck supple. No tenderness.  Skin:    General: Skin  is warm and dry.     Findings: No rash.  Neurological:     General: No focal deficit present.     Mental Status: He is alert and oriented to person, place, and time.     Cranial Nerves: Cranial nerve deficit: no gross deficits.  Psychiatric:        Mood and Affect: Mood normal.        Behavior: Behavior normal.    ED Results / Procedures / Treatments   Labs (all labs ordered are listed,  but only abnormal results are displayed) Labs Reviewed  RESP PANEL BY RT-PCR (FLU A&B, COVID) ARPGX2 - Abnormal; Notable for the following components:      Result Value   SARS Coronavirus 2 by RT PCR POSITIVE (*)    All other components within normal limits  GROUP A STREP BY PCR   EKG None  Radiology No results found.  Procedures Procedures   Medications Ordered in ED Medications  dexamethasone (DECADRON) injection 8 mg (8 mg Intramuscular Given 05/07/21 0808)    ED Course  I have reviewed the triage vital signs and the nursing notes.  Pertinent labs & imaging results that were available during my care of the patient were reviewed by me and considered in my medical decision making (see chart for details).  Clinical Course as of 05/07/21 1696  Wynelle Link May 07, 2021  0903 SARS Coronavirus 2 by RT PCR(!): POSITIVE [LJ]    Clinical Course User Index [LJ] Placido Sou, PA-C   MDM Rules/Calculators/A&P                          Pt is a 45 y.o. male who presents to the emergency department with symptoms consistent with COVID-19.  Vaccinated x2 and denies any known previous COVID-19 infections.  Physical exam reassuring.  Patient has some sinus congestion but otherwise lungs are clear to auscultation bilaterally.  Heart is regular rate and rhythm without murmurs, rubs, or gallops.  No erythema or exudates noted in the posterior oropharynx.  Uvula midline.  No hot potato voice.  No stridor.  Patient denies any chest pain or shortness of breath.  Discussed symptomatic management with patient.   Recommended continued use of Tylenol/ibuprofen but noted to patient that his current regimen is not appropriate and he is taking too much Tylenol/ibuprofen.  We discussed proper dosing.  Patient given a dose of dexamethasone in the ED.  Feel he is stable for discharge at this time and he is agreeable.  Discussed return precautions at length.  His questions were answered and he was amicable at the time of discharge.  Note: Portions of this report may have been transcribed using voice recognition software. Every effort was made to ensure accuracy; however, inadvertent computerized transcription errors may be present.   Final Clinical Impression(s) / ED Diagnoses Final diagnoses:  COVID-19   Rx / DC Orders ED Discharge Orders     None        Placido Sou, PA-C 05/07/21 7893    Bethann Berkshire, MD 05/07/21 1654

## 2022-01-12 ENCOUNTER — Ambulatory Visit (INDEPENDENT_AMBULATORY_CARE_PROVIDER_SITE_OTHER): Payer: Medicare HMO

## 2022-01-12 ENCOUNTER — Encounter (HOSPITAL_COMMUNITY): Payer: Self-pay | Admitting: Emergency Medicine

## 2022-01-12 ENCOUNTER — Ambulatory Visit (HOSPITAL_COMMUNITY)
Admission: EM | Admit: 2022-01-12 | Discharge: 2022-01-12 | Disposition: A | Discharge status: Home / Self Care | Payer: Medicare HMO | Attending: Family Medicine | Admitting: Family Medicine

## 2022-01-12 DIAGNOSIS — R0789 Other chest pain: Secondary | ICD-10-CM | POA: Diagnosis not present

## 2022-01-12 DIAGNOSIS — R071 Chest pain on breathing: Secondary | ICD-10-CM | POA: Diagnosis not present

## 2022-01-12 DIAGNOSIS — M549 Dorsalgia, unspecified: Secondary | ICD-10-CM

## 2022-01-12 MED ORDER — TIZANIDINE HCL 4 MG PO TABS: 4.0000 mg | ORAL_TABLET | Freq: Four times a day (QID) | ORAL | 0 refills | Status: DC | PRN

## 2022-01-12 NOTE — ED Triage Notes (Signed)
Pt reports having pain in chest and back when takes a deep breath for a month. Been trying to get in to PCP and got call back to go to UC for xray.  ?Taking ibuprofen for pain  ?

## 2022-01-12 NOTE — Discharge Instructions (Signed)
You were seen today for back pain and chest pain.  This appears to be muscular in nature.  Your xray was normal.  ?I recommend heating pad, ice pack, tylenol or motrin for pain.  I have sent out a muscle relaxer that may help. This can make you tired so please take when you're not driving and will be home.  ?Please follow up with your primary care provider for further discussion if needed.  ?

## 2022-01-12 NOTE — ED Provider Notes (Signed)
?[facility] ? ? ? ?CSN: [id number] ?Arrival date & time: 01/12/22  1210 ? ? ?  ? ?History   ?Chief Complaint ?Chief Complaint  ?Patient presents with  ? Chest Pain  ? Back Pain  ? ? ?HPI ?Albert [name] is a 46 y.o. male.  ? ?Patient is here for chest pain and back pain x 1 month.  ?When he breaths in feels tension/pulling at the left side of the chest.  He also has a sharp stabbing pain at the left upper back/should blade.  ?Pain comes and goes.  He has been taking motrin for pain, which is helpful,but returns if he does not take it.  ?He also has pain at the right lower back as well.  He has to toss and turn at night to get comfortable.  ?No sob noted.  No swelling.  ?No n/v.   ?He does not work, disabled.  ?This started after he took a big hit from marijuana.  Held a deep breath of it, and it started afterward.  ? ?Past Medical History:  ?Diagnosis Date  ? Alcoholism (HCC)   ? Drug abuse (HCC)   ? xanax addiction 5 years ago  ? Medical non-compliance   ? Paranoid schizophrenia (HCC)   ? Schizophrenia (HCC)   ? Seizures (HCC)   ? ? ?Patient Active Problem List  ? Diagnosis Date Noted  ? Adjustment disorder with disturbance of emotion 01/11/2020  ? Schizophrenia (HCC) 09/01/2019  ? Bipolar disorder (HCC) 01/24/2019  ? Schizoaffective disorder (HCC) 01/23/2019  ? Paranoid schizophrenia (HCC) 06/05/2018  ? Alcohol use disorder, moderate, in sustained remission (HCC) 07/09/2017  ? Cannabis use disorder, moderate, in sustained remission (HCC) 07/09/2017  ? Elevated TSH 01/05/2017  ? Tachycardia 01/05/2017  ? Schizoaffective disorder, bipolar type (HCC) 01/04/2017  ? Alcoholism (HCC)   ? Drug abuse (HCC)   ? Medical non-compliance   ? ? ?History reviewed. No pertinent surgical history. ? ? ? ? ?Home Medications   ? ?Prior to Admission medications   ?Medication Sig Start Date End Date Taking? Authorizing Provider  ?benztropine (COGENTIN) 1 MG tablet Take 1 tablet (1 mg total) by mouth 2 (two) times  daily. ?Patient not taking: No sig reported 01/11/20   Rankin, Shuvon B, NP  ?divalproex (DEPAKOTE ER) 500 MG 24 hr tablet Take 1,500 mg by mouth at bedtime. 11/10/20   [provider]  ?divalproex (DEPAKOTE) 500 MG DR tablet Take 3 tablets (1,500 mg total) by mouth at bedtime. ?Patient not taking: Reported on 12/16/2020 01/11/20   Rankin, Shuvon B, NP  ?paliperidone (INVEGA SUSTENNA) 234 MG/1.5ML SUSY injection Inject 234 mg into the muscle every 30 (thirty) days. (Due on 11-09-19): For mood control 11/09/19   Armandina StammerNwoko, Agnes I, NP  ?paliperidone (INVEGA) 9 MG 24 hr tablet Take 1 tablet (9 mg total) by mouth daily. ?Patient not taking: No sig reported 01/12/20   Rankin, Shuvon B, NP  ?QUEtiapine (SEROQUEL) 300 MG tablet Take 300 mg by mouth at bedtime. 09/21/20   [provider]  ?QUEtiapine (SEROQUEL) 400 MG tablet Take 1 tablet (400 mg total) by mouth at bedtime. ?Patient not taking: Reported on 12/16/2020 01/11/20   Rankin, Denice BorsShuvon B, NP  ? ? ?Family History ?Family History  ?Problem Relation Age of Onset  ? Thyroid disease Mother   ? Depression Maternal Aunt   ? ? ?Social History ?Social History  ? ?Tobacco Use  ? Smoking status: Never  ? Smokeless tobacco: Never  ? Tobacco comments:  ?  Non-smoker  ?Vaping Use  ? Vaping Use: Never used  ?Substance Use Topics  ? Alcohol use: Not Currently  ? Drug use: Not Currently  ?  Types: Marijuana  ?  Comment: xanax- several years ago  ? ? ? ?Allergies   ?Patient has no known allergies. ? ? ?Review of Systems ?Review of Systems  ?Constitutional: Negative.   ?HENT: Negative.    ?Respiratory:  Negative for cough, chest tightness and wheezing.   ?Cardiovascular:  Positive for chest pain.  ?Gastrointestinal: Negative.   ?Musculoskeletal:  Positive for back pain.  ? ? ?Physical Exam ?Triage Vital Signs ?ED Triage Vitals  ?Enc Vitals Group  ?   BP 01/12/22 1221 133/84  ?   Pulse Rate 01/12/22 1221 100  ?   Resp 01/12/22 1221 19  ?   Temp 01/12/22 1221 97.7 ?F (36.5 ?C)  ?    Temp Source 01/12/22 1221 Oral  ?   SpO2 01/12/22 1221 95 %  ?   Weight --   ?   Height --   ?   Head Circumference --   ?   Peak Flow --   ?   Pain Score 01/12/22 1220 7  ?   Pain Loc --   ?   Pain Edu? --   ?   Excl. in GC? --   ? ?No data found. ? ?Updated Vital Signs ?BP 133/84 (BP Location: Left Arm)   Pulse 100   Temp 97.7 ?F (36.5 ?C) (Oral)   Resp 19   SpO2 95%  ? ?Visual Acuity ?Right Eye Distance:   ?Left Eye Distance:   ?Bilateral Distance:   ? ?Right Eye Near:   ?Left Eye Near:    ?Bilateral Near:    ? ?Physical Exam ?Constitutional:   ?   Appearance: He is well-developed.  ?Cardiovascular:  ?   Rate and Rhythm: Normal rate and regular rhythm.  ?   Heart sounds: Normal heart sounds.  ?Pulmonary:  ?   Effort: Pulmonary effort is normal.  ?   Breath sounds: Normal breath sounds.  ?Chest:  ?   Chest wall: Tenderness present.  ?   Comments: TTP to the left chest wall ?Musculoskeletal:  ?   Cervical back: Normal range of motion and neck supple.  ?   Comments: TTP to the left upper back, shoulder blade area;  pain with movement/rotation of the left shoulder;  ?TTP at the right mid back;   ?No spinous tenderness noted   ?Neurological:  ?   Mental Status: He is alert.  ? ? ? ?UC Treatments / Results  ?Labs ?(all labs ordered are listed, but only abnormal results are displayed) ?Labs Reviewed - No data to display ? ?EKG ? ? ?Radiology ?DG Chest 2 View ? ?Result Date: 01/12/2022 ?CLINICAL DATA:  Chest pain with deep inspiration for 1 month. EXAM: CHEST - 2 VIEW COMPARISON:  01/22/19 FINDINGS: Normal heart size. No pleural effusion or edema. Scarring is identified within the left lower lobe along the left heart border. No superimposed airspace consolidation. The visualized osseous structures are unremarkable. IMPRESSION: 1. No acute findings. 2. Left lower lobe scarring. Electronically Signed   By: Signa Kell M.D.   On: 01/12/2022 13:07   ? ?Procedures ?Procedures (including critical care  time) ? ?Medications Ordered in UC ?Medications - No data to display ? ?Initial Impression / Assessment and Plan / UC Course  ?I have reviewed the triage vital signs and the nursing notes. ? ?Pertinent  labs & imaging results that were available during my care of the patient were reviewed by me and considered in my medical decision making (see chart for details). ? ?  ? ?Final Clinical Impressions(s) / UC Diagnoses  ? ?Final diagnoses:  ?Chest wall pain  ?Acute back pain, unspecified back location, unspecified back pain laterality  ? ? ? ?Discharge Instructions   ? ?  ?You were seen today for back pain and chest pain.  This appears to be muscular in nature.  Your xray was normal.  ?I recommend heating pad, ice pack, tylenol or motrin for pain.  I have sent out a muscle relaxer that may help. This can make you tired so please take when you're not driving and will be home.  ?Please follow up with your primary care provider for further discussion if needed.  ? ? ? ?ED Prescriptions   ? ? Medication Sig Dispense Auth. Provider  ? tiZANidine (ZANAFLEX) 4 MG tablet Take 1 tablet (4 mg total) by mouth every 6 (six) hours as needed for muscle spasms. 15 tablet Jannifer Franklin, MD  ? ?  ? ?PDMP not reviewed this encounter. ?  ?Jannifer Franklin, MD ?01/12/22 1314 ? ?

## 2022-06-01 ENCOUNTER — Ambulatory Visit: Payer: Self-pay

## 2022-06-01 NOTE — Telephone Encounter (Signed)
  Chief Complaint: Panic attack anxiety Symptoms: panic attack last 1 hour Frequency: over 1 year Pertinent Negatives: Patient denies Self harm Disposition: [] ED /[x] Urgent Care (no appt availability in office) / [] Appointment(In office/virtual)/ []  [place] [facility]/ [] Home Care/ [] Refused Recommended Disposition /[] [place] [facility]/ []  Follow-up with PCP Additional Notes: Spoke with [facility] and warm transferred call to them. Ptwill call back if needed. Reason for Disposition  Patient sounds very upset or troubled to the triager  Answer Assessment - Initial Assessment Questions 1. CONCERN: "Did anything happen that prompted you to call today?"      unsure 2. ANXIETY SYMPTOMS: "Can you describe how you (your loved one; patient) have been feeling?" (e.g., tense, restless, panicky, anxious, keyed up, overwhelmed, sense of impending doom).      Off and on over 1 year 3. ONSET: "How long have you been feeling this way?" (e.g., hours, days, weeks)     1 hour - about 1 year 4. SEVERITY: "How would you rate the level of anxiety?" (e.g., 0 - 10; or mild, moderate, severe).     severe 5. FUNCTIONAL IMPAIRMENT: "How have these feelings affected your ability to do daily activities?" "Have you had more difficulty than usual doing your normal daily activities?" (e.g., getting better, same, worse; self-care, school, work, interactions)     yes 6. HISTORY: "Have you felt this way before?" "Have you ever been diagnosed with an anxiety problem in the past?" (e.g., generalized anxiety disorder, panic attacks, PTSD). If Yes, ask: "How was this problem treated?" (e.g., medicines, counseling, etc.)     yes 7. RISK OF HARM - SUICIDAL IDEATION: "Do you ever have thoughts of hurting or killing yourself?" If Yes, ask:  "Do you have these feelings now?" "Do you have a plan on how you would do this?"     no 8. TREATMENT:  "What has been done so far to treat this anxiety?" (e.g., medicines, relaxation  strategies). "What has helped?"     no 9. TREATMENT - THERAPIST: "Do you have a counselor or therapist? Name?"     yes 10. POTENTIAL TRIGGERS: "Do you drink caffeinated beverages (e.g., coffee, colas, teas), and how much daily?" "Do you drink alcohol or use any drugs?" "Have you started any new medicines recently?"       Thinking about unhelpful thoughts 11. PATIENT SUPPORT: "Who is with you now?" "Who do you live with?" "Do you have family or friends who you can talk to?"        A therapist 12. OTHER SYMPTOMS: "Do you have any other symptoms?" (e.g., feeling depressed, trouble concentrating, trouble sleeping, trouble breathing, palpitations or fast heartbeat, chest pain, sweating, nausea, or diarrhea)       Chest tightness when panic attack is present. 13. PREGNANCY: "Is there any chance you are pregnant?" "When was your last menstrual period?"       na  Protocols used: Anxiety and Panic Attack-A-AH

## 2022-07-23 ENCOUNTER — Encounter (HOSPITAL_COMMUNITY): Payer: Self-pay | Admitting: Psychiatry

## 2022-07-23 ENCOUNTER — Inpatient Hospital Stay (HOSPITAL_COMMUNITY)
Admission: RE | Admit: 2022-07-23 | Discharge: 2022-08-01 | DRG: 885 | Disposition: A | Discharge status: Home / Self Care | Payer: 59 | Attending: Psychiatry | Admitting: Psychiatry

## 2022-07-23 ENCOUNTER — Other Ambulatory Visit: Payer: Self-pay

## 2022-07-23 DIAGNOSIS — Z87891 Personal history of nicotine dependence: Secondary | ICD-10-CM | POA: Diagnosis not present

## 2022-07-23 DIAGNOSIS — F1091 Alcohol use, unspecified, in remission: Secondary | ICD-10-CM | POA: Diagnosis present

## 2022-07-23 DIAGNOSIS — Z79899 Other long term (current) drug therapy: Secondary | ICD-10-CM

## 2022-07-23 DIAGNOSIS — F3130 Bipolar disorder, current episode depressed, mild or moderate severity, unspecified: Secondary | ICD-10-CM | POA: Diagnosis present

## 2022-07-23 DIAGNOSIS — K219 Gastro-esophageal reflux disease without esophagitis: Secondary | ICD-10-CM | POA: Diagnosis present

## 2022-07-23 DIAGNOSIS — F3160 Bipolar disorder, current episode mixed, unspecified: Secondary | ICD-10-CM

## 2022-07-23 DIAGNOSIS — F314 Bipolar disorder, current episode depressed, severe, without psychotic features: Principal | ICD-10-CM

## 2022-07-23 DIAGNOSIS — Z818 Family history of other mental and behavioral disorders: Secondary | ICD-10-CM | POA: Diagnosis not present

## 2022-07-23 DIAGNOSIS — E781 Pure hyperglyceridemia: Secondary | ICD-10-CM | POA: Diagnosis present

## 2022-07-23 DIAGNOSIS — I10 Essential (primary) hypertension: Secondary | ICD-10-CM | POA: Diagnosis present

## 2022-07-23 DIAGNOSIS — F1211 Cannabis abuse, in remission: Secondary | ICD-10-CM | POA: Diagnosis present

## 2022-07-23 DIAGNOSIS — Z20822 Contact with and (suspected) exposure to covid-19: Secondary | ICD-10-CM | POA: Diagnosis present

## 2022-07-23 DIAGNOSIS — F250 Schizoaffective disorder, bipolar type: Principal | ICD-10-CM | POA: Diagnosis present

## 2022-07-23 HISTORY — DX: Bipolar disorder, unspecified: F31.9

## 2022-07-23 HISTORY — DX: Heartburn: R12

## 2022-07-23 LAB — SARS CORONAVIRUS 2 BY RT PCR: SARS Coronavirus 2 by RT PCR: NEGATIVE

## 2022-07-23 MED ORDER — ALUM & MAG HYDROXIDE-SIMETH 200-200-20 MG/5ML PO SUSP: 30.0000 mL | ORAL | Status: DC | PRN

## 2022-07-23 MED ORDER — TRAZODONE HCL 50 MG PO TABS
50.0000 mg | ORAL_TABLET | Freq: Every evening | ORAL | Status: DC | PRN
  Administered 2022-07-23: 50 mg via ORAL
  Filled 2022-07-23 (×4): qty 1

## 2022-07-23 MED ORDER — MAGNESIUM HYDROXIDE 400 MG/5ML PO SUSP: 30.0000 mL | Freq: Every day | ORAL | Status: DC | PRN

## 2022-07-23 MED ORDER — HYDROXYZINE HCL 25 MG PO TABS
25.0000 mg | ORAL_TABLET | Freq: Four times a day (QID) | ORAL | Status: DC | PRN
  Administered 2022-07-23: 25 mg via ORAL
  Filled 2022-07-23 (×3): qty 1

## 2022-07-23 MED ORDER — ACETAMINOPHEN 325 MG PO TABS: 650.0000 mg | ORAL_TABLET | Freq: Four times a day (QID) | ORAL | Status: DC | PRN

## 2022-07-23 NOTE — [facility] Group Notes (Signed)
Pt did attend AA 

## 2022-07-23 NOTE — H&P (Signed)
Behavioral Health Medical Screening Exam  Albert [name] is an 46 y.o. male who presents as a walk in due to worsening symptoms of bipolar depression. Reports has been seen by Dr. Reece Levy four months ago who was prescribing seroquel/depakote per his report. Has not taken these medications for his bipolar disorder in at least three months. Attends NA meetings for history of alcohl/xanax dependence with report of no use since 04/01/22. Reports in 1999 symptoms of depression resulted in thoughts of killing himself via firearm. States "It's starting the same way. Feeling I am worthless and unloved. Then I start having thoughts of ending my life using a gun. I am thinking about it more often. That's why I came here today for help."  Total Time spent with patient: 30 minutes  Psychiatric Specialty Exam: Physical Exam HENT:     Head: Normocephalic.  Cardiovascular:     Rate and Rhythm: Normal rate and regular rhythm.  Pulmonary:     Effort: Pulmonary effort is normal.  Abdominal:     General: Bowel sounds are normal.     Palpations: Abdomen is soft.  Skin:    General: Skin is warm and dry.  Neurological:     Mental Status: He is alert and oriented to person, place, and time.    Review of Systems Blood pressure (!) 136/95, pulse 82, temperature 99 F (37.2 C), temperature source Oral, resp. rate 20, SpO2 98 %.There is no height or weight on file to calculate BMI. General Appearance: Casual Eye Contact:  Minimal Speech:  Slow Volume:  Decreased Mood:  Dysphoric, Hopeless, and Worthless Affect:  Constricted and Depressed Thought Process:  Descriptions of Associations: Circumstantial Orientation:  Full (Time, Place, and Person) Thought Content:  Obsessions and Rumination Suicidal Thoughts:  Reports that current symptoms have led him to become suicidal with thoughts of shooting himself.  Homicidal Thoughts:  No Memory:  Immediate;   Fair Recent;   Fair Remote;   Fair Judgement:   Impaired Insight:  Shallow Psychomotor Activity:  Decreased Concentration: Concentration: Fair and Attention Span: Fair Recall:  [employer] of Knowledge:Fair Language: Good Akathisia:  No Handed:  Right AIMS (if indicated):    Assets:  Communication Skills Desire for Improvement Housing Leisure Time Physical Health Resilience Sleep:     Musculoskeletal: Strength & Muscle Tone: within normal limits Gait & Station: normal Patient leans: N/A  Blood pressure (!) 136/95, pulse 82, temperature 99 F (37.2 C), temperature source Oral, resp. rate 20, SpO2 98 %.  [place] Scale:  [place] ED from 01/12/2022 in [facility] ED from 05/07/2021 in [place] DEPT ED from 12/16/2020 in [place] DEPT  C-SSRS RISK CATEGORY No Risk No Risk No Risk       Recommendations: Based on my evaluation the patient does not appear to have an emergency medical condition.  Patient meets criteria for inpatient psychiatric admission due to severity of symptoms and need for crisis stabilization.   Elmarie Shiley, NP 07/23/2022, 1:43 PM

## 2022-07-24 DIAGNOSIS — F3160 Bipolar disorder, current episode mixed, unspecified: Secondary | ICD-10-CM

## 2022-07-24 LAB — RAPID URINE DRUG SCREEN, HOSP PERFORMED
Amphetamines: NOT DETECTED
Barbiturates: NOT DETECTED
Benzodiazepines: NOT DETECTED
Cocaine: NOT DETECTED
Opiates: NOT DETECTED
Tetrahydrocannabinol: NOT DETECTED

## 2022-07-24 LAB — COMPREHENSIVE METABOLIC PANEL
ALT: 28 U/L (ref 0–44)
AST: 23 U/L (ref 15–41)
Albumin: 4 g/dL (ref 3.5–5.0)
Alkaline Phosphatase: 65 U/L (ref 38–126)
Anion gap: 8 (ref 5–15)
BUN: 13 mg/dL (ref 6–20)
CO2: 22 mmol/L (ref 22–32)
Calcium: 9.3 mg/dL (ref 8.9–10.3)
Chloride: 105 mmol/L (ref 98–111)
Creatinine, Ser: 0.84 mg/dL (ref 0.61–1.24)
GFR, Estimated: >60 mL/min (ref >60)
Glucose, Bld: 111 mg/dL — ABNORMAL HIGH (ref 70–99)
Potassium: 3.7 mmol/L (ref 3.5–5.1)
Sodium: 135 mmol/L (ref 135–145)
Total Bilirubin: 0.6 mg/dL (ref 0.3–1.2)
Total Protein: 7.6 g/dL (ref 6.5–8.1)

## 2022-07-24 LAB — CBC
HCT: 45.8 % (ref 39.0–52.0)
Hemoglobin: 15.3 g/dL (ref 13.0–17.0)
MCH: 31.7 pg (ref 26.0–34.0)
MCHC: 33.4 g/dL (ref 30.0–36.0)
MCV: 94.8 fL (ref 80.0–100.0)
Platelets: 343 10*3/uL (ref 150–400)
RBC: 4.83 MIL/uL (ref 4.22–5.81)
RDW: 12.8 % (ref 11.5–15.5)
WBC: 10.1 10*3/uL (ref 4.0–10.5)
nRBC: 0 % (ref 0.0–0.2)

## 2022-07-24 LAB — TSH: TSH: 3.607 u[IU]/mL (ref 0.350–4.500)

## 2022-07-24 MED ORDER — PANTOPRAZOLE SODIUM 40 MG PO TBEC
40.0000 mg | DELAYED_RELEASE_TABLET | Freq: Every day | ORAL | Status: DC
  Administered 2022-07-24 – 2022-08-01 (×9): 40 mg via ORAL
  Filled 2022-07-24 (×12): qty 1

## 2022-07-24 MED ORDER — QUETIAPINE FUMARATE 100 MG PO TABS
100.0000 mg | ORAL_TABLET | Freq: Every day | ORAL | Status: DC
  Administered 2022-07-24: 100 mg via ORAL
  Filled 2022-07-24 (×4): qty 1

## 2022-07-24 MED ORDER — LORAZEPAM 1 MG PO TABS: 1.0000 mg | ORAL_TABLET | ORAL | Status: DC | PRN

## 2022-07-24 MED ORDER — ZIPRASIDONE MESYLATE 20 MG IM SOLR: 20.0000 mg | INTRAMUSCULAR | Status: DC | PRN

## 2022-07-24 MED ORDER — DIVALPROEX SODIUM ER 500 MG PO TB24
1000.0000 mg | ORAL_TABLET | Freq: Every day | ORAL | Status: DC
  Administered 2022-07-24 – 2022-07-31 (×8): 1000 mg via ORAL
  Filled 2022-07-24 (×12): qty 2

## 2022-07-24 MED ORDER — OLANZAPINE 5 MG PO TBDP: 5.0000 mg | ORAL_TABLET | Freq: Three times a day (TID) | ORAL | Status: DC | PRN

## 2022-07-24 MED ORDER — NICOTINE POLACRILEX 2 MG MT GUM: 2.0000 mg | CHEWING_GUM | OROMUCOSAL | Status: DC | PRN
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
## 2022-07-24 NOTE — Group Note (Signed)
Date:  07/24/2022 Time:  11:35 AM  Group Topic/Focus:  Orientation:   The focus of this group is to educate the patient on the purpose and policies of crisis stabilization and provide a format to answer questions about their admission.  The group details unit policies and expectations of patients while admitted.    Participation Level:  None  Participation Quality:  Inattentive  Affect:  Not Congruent  Cognitive:  Lacking  Insight: Limited  Engagement in Group:  Lacking  Modes of Intervention:  Discussion  Additional Comments:     Jerrye Beavers 07/24/2022, 11:35 AM

## 2022-07-24 NOTE — Plan of Care (Signed)
Patient is a new admission from last night, being admitted for worsening symptoms of bipolar depression. He denies any current SI/HI/AVH and contracts verbally for safety. He does rate his depression a 3/10, hopelessness 2/10 and anxiety 1/10. He denies any physical pain. He reports he slept well last night. Pt denies any physical problems today.     Problem: Education: Goal: Knowledge of [facility] Education information/materials will improve Outcome: Progressing Goal: Emotional status will improve Outcome: Progressing Goal: Mental status will improve Outcome: Progressing
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
## 2022-07-24 NOTE — Progress Notes (Signed)
The patient rated her day as a 6 or 7 out of 10. His positive event for the day is that he spoke to his doctor and is taking the same medicine. His coping skill will be to read his A.A. book.

## 2022-07-24 NOTE — Tx Team (Signed)
Initial Treatment Plan 07/24/2022 12:28 AM Albert [name] [id number]    PATIENT STRESSORS: Medication change or noncompliance   Other: "rejection from loves ones"     PATIENT STRENGTHS: Capable of independent living  Communication skills  General fund of knowledge  Motivation for treatment/growth  Physical Health    PATIENT IDENTIFIED PROBLEMS: Depression that has worsened in the past week  Feeling hopeless  "Rejection from loved ones"  Feeling lonely  Calling suicide hotline 1-2 times a day  Has been off medications for 3-4 months           DISCHARGE CRITERIA:  Improved stabilization in mood, thinking, and/or behavior Motivation to continue treatment in a less acute level of care Need for constant or close observation no longer present Reduction of life-threatening or endangering symptoms to within safe limits Verbal commitment to aftercare and medication compliance  PRELIMINARY DISCHARGE PLAN: Outpatient therapy Return to previous living arrangement Return to previous work or school arrangements  PATIENT/FAMILY INVOLVEMENT: This treatment plan has been presented to and reviewed with the patient, Albert [name], and/or family member.  The patient and family have been given the opportunity to ask questions and make suggestions.  Harlow Asa, RN 07/24/2022, 12:28 AM

## 2022-07-24 NOTE — Group Note (Signed)
Recreation Therapy Group Note   Group Topic:Animal Assisted Therapy   Group Date: 07/24/2022 Start Time: 1430 End Time: 1515 Facilitators: Dossie Ocanas-McCall, LRT,CTRS Location: 300 Hall Dayroom   Animal-Assisted Activity (AAA) Program Checklist/Progress Notes Patient Eligibility Criteria Checklist & Daily Group note for Rec Tx Intervention  AAA/T Program Assumption of Risk Form signed by Patient/ or Parent Legal Guardian Yes  Patient is free of allergies or severe asthma Yes  Patient reports no fear of animals Yes  Patient reports no history of cruelty to animals Yes  Patient understands his/her participation is voluntary Yes  Patient washes hands before animal contact Yes  Patient washes hands after animal contact Yes   Affect/Mood: Appropriate   Participation Level: Engaged   Participation Quality: Independent   Behavior: Appropriate    Clinical Observations/Individualized Feedback:  Patient attended session and interacted appropriately with therapy dog and peers. Patient asked appropriate questions about therapy dog and his training. Patient shared stories about their pets at home with group.    Plan: Continue to engage patient in RT group sessions 2-3x/week.   Al[name], LRT,CTRS 07/24/2022 3:38 PM
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
## 2022-07-25 ENCOUNTER — Encounter (HOSPITAL_COMMUNITY): Payer: Self-pay

## 2022-07-25 LAB — LIPID PANEL
Cholesterol: 171 mg/dL (ref 0–200)
HDL: 34 mg/dL — ABNORMAL LOW (ref >40)
LDL Cholesterol: 78 mg/dL (ref 0–99)
Total CHOL/HDL Ratio: 5 RATIO
Triglycerides: 294 mg/dL — ABNORMAL HIGH (ref <150)
VLDL: 59 mg/dL — ABNORMAL HIGH (ref 0–40)

## 2022-07-25 LAB — HEMOGLOBIN A1C
Hgb A1c MFr Bld: 5.3 % (ref 4.8–5.6)
Mean Plasma Glucose: 105.41 mg/dL

## 2022-07-25 MED ORDER — QUETIAPINE FUMARATE 200 MG PO TABS
200.0000 mg | ORAL_TABLET | Freq: Every day | ORAL | Status: DC
  Administered 2022-07-25: 200 mg via ORAL
  Filled 2022-07-25 (×2): qty 1

## 2022-07-25 NOTE — Progress Notes (Signed)
Pt was observed on the 400 hall phone calling the unit. Pt stated "there is not enough staff, pts are on the floor, staff is crying and irritated, they can't open the dayroom and we do not know what to do". When asked who the caller was the pt responded "this is Albert [name] that is all you need to know". There was no staff upset occurring and pts were sitting on the floor near the bench waiting for dinner. Pt was assured that staff is present and available on the floor if he has a concern. Pt appears irritable and paranoid. Pt remains safe on Q15 min checks and contracts for safety.

## 2022-07-25 NOTE — Progress Notes (Addendum)
   07/24/22 2000  Psychosocial Assessment  Patient Complaints Depression  Eye Contact Avertive  Facial Expression Flat  Affect Depressed;Anxious  Speech Logical/coherent  Interaction Cautious  Motor Activity Slow  Appearance/Hygiene Unremarkable  Behavior Characteristics Cooperative  Mood Depressed;Pleasant  Thought Process  Coherency WDL  Content WDL  Delusions None reported or observed  Perception WDL  Hallucination None reported or observed  Judgment Limited  Confusion None  Danger to Self  Current suicidal ideation? Denies  Danger to Others  Danger to Others None reported or observed   EKG complete and on front of chart.

## 2022-07-25 NOTE — Progress Notes (Signed)
Pt BP on monitor was 137/121 at 1624. RN rechecked manually at 1631 it was 136/98. Pt reports no complaints. Pt remains safe on Q15 min checks and contracts for safety.      07/25/22 1631  Vital Signs  BP (!) 136/98  BP Location Right Arm  BP Method Manual

## 2022-07-25 NOTE — Progress Notes (Signed)
Pt walked out of his bedroom, down the hall around 22:50 to tell writer that he wasn't getting along with his roommate Insurance claims handler). Pt states "he told me to shut up for breathing and said I couldn't cut any lights on to be able to see". In an attempt to diffuse the situation, writer went down to room 307 to talk to both pts. Writer suggested that both of them should be mindful of each other for the time that they are roommates. Albert [name] then became agitated with Probation officer stating "So, what am I supposed to just not breath. Just get the fuck out!" Writer left the room and pt followed after asking for my name, threatening Probation officer, and began arguing with his nurse as well. Nurse was notified of the situation. Pt is currently in bed, calm, and composed.

## 2022-07-25 NOTE — Progress Notes (Signed)
Pt stated SI better and less frequent and pt feels a little better overall    07/25/22 2000  Psych Admission Type (Psych Patients Only)  Admission Status Voluntary  Psychosocial Assessment  Patient Complaints Depression  Eye Contact Fair  Facial Expression Flat;Sad  Affect Depressed  Speech Logical/coherent  Interaction Cautious  Motor Activity Slow  Appearance/Hygiene Unremarkable  Behavior Characteristics Cooperative  Mood Depressed  Aggressive Behavior  Effect No apparent injury  Thought Process  Coherency WDL  Content WDL  Delusions WDL  Perception WDL  Hallucination None reported or observed  Judgment Limited  Confusion None  Danger to Self  Current suicidal ideation? Passive  Self-Injurious Behavior Some self-injurious ideation observed or expressed.  No lethal plan expressed   Agreement Not to Harm Self Yes  Description of Agreement verbal contract for safety  Danger to Others  Danger to Others None reported or observed

## 2022-07-25 NOTE — Progress Notes (Addendum)
[facility] MD Progress Note  07/25/2022 11:04 AM Albert [name]  MRN:  [id number] Principal Problem: Schizoaffective disorder, bipolar type ([name]) Diagnosis: Principal Problem:   Schizoaffective disorder, bipolar type ([name])  Reason for Admission: Albert [name] is a 46 y.o. male with self-reported past psychiatric hx of bipolar depression, who presented to [facility] voluntarily with thoughts of "hopelessness, sadness, betrayal, and rejection." At his intake assessment as a walk-in to [facility], there was concern for possible suicidal ideation and the patient voluntarily was admitted to [facility] for acute stabilization. This is hospitalization day 2.  Yesterday's Psychiatry Team's Recommendation: Restarted his Depakote ER 1000 mg QHS titrating up as needed for mood stabilization. Restarting Seroquel 100mg  qhs for mood stabilization and paranoia. Starting Protonix 40 mg daily for his GERD.   Subjective: Patient states his day went fine and he slept well last night. Patient states he has been eating well. He denies any SI/HI, AVH, thought insertion, withdrawal, broadcasting, receiving messages from electronic devices and cravings/withdrawal from substance use. He denies any C/P, HA, and n/v/d. He reports last BM as yesterday. He reports no feelings of hopelessness today but states he has an "inside feeling of wanting retaliation towards people that rejected me." Patient denies having a plan for retaliation at this time.He feels his ruminations about previous conflict with roommate and self-deprecating thoughts have lessened.  Patient remains aloof and elusive in responses. He denies any feelings of paranoia and is glad we started his Depakote and Seroquel which he self-discontinued. He states he is being more social on the unit. He reports that his previous depression, which he denied yesterday, is turning into anger and he now recognizes that he may have been depressed prior to admission. He states he is trying to be  less selfish with his thoughts and is reading his AA book and focusing on prayer and being humble today in an effort to "ground" his thoughts.  When asked regarding his multiple diagnoses of schizophrenia and schizoaffective disorder in the past, he says maybe he does have these diagnoses but is vague and evasive with attempts to discuss past psychotic episodes. Patient does not clarify last episode of psychotic symptoms and is defensive when attempting to discuss past reported psychotic sx noted in EHR. Patient is informed on process of safety planning and is asked whether we can speak with his mom regarding this. Patient hesitates and states yes, he provides name, Albert [name] and phone number [phone].   Objective:  Chart Review Past 24 hours of patient's chart was reviewed.  Patient is  compliant with scheduled meds. Required Agitation PRNs: none Per RN notes, no documented behavioral issues and is  attending group. Total sleep time not documented  Total Time Spent in Direct Patient Care:  I personally spent 30 minutes on the unit in direct patient care. The direct patient care time included face-to-face time with the patient, reviewing the patient's chart, communicating with other professionals, and coordinating care. Greater than 50% of this time was spent in counseling or coordinating care with the patient regarding goals of hospitalization, psycho-education, and discharge planning needs.   Past Psychiatric History: See H&P   Past Medical History:  Past Medical History:  Diagnosis Date   Alcoholism ([place])    Bipolar disorder ([place])    Drug abuse ([place])    xanax addiction 5 years ago   Heartburn    Medical non-compliance    Paranoid schizophrenia ([place])    Schizophrenia ([place])    Seizures ([place])  Past Surgical History:  Procedure Laterality Date   NO PAST SURGERIES     Family History: See H&P Family History  Problem Relation Age of Onset   Thyroid disease Mother     Depression Maternal Aunt    Family Psychiatric History: see H&P   Social History:  Social History   Substance and Sexual Activity  Alcohol Use Not Currently   Comment: has been sober since June 4th     Social History   Substance and Sexual Activity  Drug Use Not Currently   Types: Marijuana, Benzodiazepines   Comment: THC: was smoking 1 ounce every day, last use: April & xanax use was 10 mg every day for "a few months"    Social History   Socioeconomic History   Marital status: Single    Spouse name: Not on file   Number of children: Not on file   Years of education: Not on file   Highest education level: Not on file  Occupational History    Comment: Unclear  Tobacco Use   Smoking status: Former    Types: Cigarettes    Quit date: 02/21/2022    Years since quitting: 0.4   Smokeless tobacco: Never   Tobacco comments:    He stopped smoking in April of 2023. He said that he used to smoke "a few packs per day."   Vaping Use   Vaping Use: Never used  Substance and Sexual Activity   Alcohol use: Not Currently    Comment: has been sober since June 4th   Drug use: Not Currently    Types: Marijuana, Benzodiazepines    Comment: THC: was smoking 1 ounce every day, last use: April & xanax use was 10 mg every day for "a few months"   Sexual activity: Not Currently  Other Topics Concern   Not on file  Social History Narrative   Pt reported that he lives in a house apartment.   Social Determinants of Health   Financial Resource Strain: Not on file  Food Insecurity: Food Insecurity Present (07/23/2022)   Hunger Vital Sign    Worried About Running Out of Food in the Last Year: Sometimes true    Ran Out of Food in the Last Year: Sometimes true  Transportation Needs: No Transportation Needs (07/23/2022)   PRAPARE - Administrator, Civil Service (Medical): No    Lack of Transportation (Non-Medical): No  Physical Activity: Not on file  Stress: Not on file  Social  Connections: Not on file    Current Medications: Current Facility-Administered Medications  Medication Dose Route Frequency Provider Last Rate Last Admin   acetaminophen (TYLENOL) tablet 650 mg  650 mg Oral Q6H PRN Thermon Leyland, NP       alum & mag hydroxide-simeth (MAALOX/MYLANTA) 200-200-20 MG/5ML suspension 30 mL  30 mL Oral Q4H PRN Fransisca Kaufmann A, NP       divalproex (DEPAKOTE ER) 24 hr tablet 1,000 mg  1,000 mg Oral QHS Mason Jim, Elgin Carn E, MD   1,000 mg at 07/24/22 2112   hydrOXYzine (ATARAX) tablet 25 mg  25 mg Oral Q6H PRN Fransisca Kaufmann A, NP   25 mg at 07/23/22 2352   OLANZapine zydis (ZYPREXA) disintegrating tablet 5 mg  5 mg Oral Q8H PRN Comer Locket, MD       And   LORazepam (ATIVAN) tablet 1 mg  1 mg Oral PRN Comer Locket, MD       And   ziprasidone (GEODON)  injection 20 mg  20 mg Intramuscular PRN Mason JimSingleton, Keilyn Haggard E, MD       magnesium hydroxide (MILK OF MAGNESIA) suspension 30 mL  30 mL Oral Daily PRN Thermon Leylandavis, Laura A, NP       nicotine polacrilex (NICORETTE) gum 2 mg  2 mg Oral PRN Comer LocketSingleton, Orvie Caradine E, MD       pantoprazole (PROTONIX) EC tablet 40 mg  40 mg Oral Daily Mason JimSingleton, Verena Shawgo E, MD   40 mg at 07/25/22 0831   QUEtiapine (SEROQUEL) tablet 100 mg  100 mg Oral QHS Bartholomew CrewsSingleton, Cecile Guevara E, MD   100 mg at 07/24/22 2112   traZODone (DESYREL) tablet 50 mg  50 mg Oral QHS PRN Thermon Leylandavis, Laura A, NP   50 mg at 07/23/22 2352    Lab Results:  Results for orders placed or performed during the hospital encounter of 07/23/22 (from the past 24 hour(s))  Lipid panel     Status: Abnormal   Collection Time: 07/25/22  7:22 AM  Result Value Ref Range   Cholesterol 171 0 - 200 mg/dL   Triglycerides 469294 (H) <150 mg/dL   HDL 34 (L) >62>40 mg/dL   Total CHOL/HDL Ratio 5.0 RATIO   VLDL 59 (H) 0 - 40 mg/dL   LDL Cholesterol 78 0 - 99 mg/dL  Hemoglobin X5MA1c     Status: None   Collection Time: 07/25/22  7:22 AM  Result Value Ref Range   Hgb A1c MFr Bld 5.3 4.8 - 5.6 %   Mean Plasma Glucose 105.41  mg/dL    Blood Alcohol level:  Lab Results  Component Value Date   ETH <10 12/16/2020   ETH <10 01/30/2020    Metabolic Disorder Labs: Lab Results  Component Value Date   HGBA1C 5.3 07/25/2022   MPG 105.41 07/25/2022   MPG 100 01/05/2017   Lab Results  Component Value Date   PROLACTIN 30.4 (H) 01/06/2017   PROLACTIN 13.1 01/05/2017   Lab Results  Component Value Date   CHOL 171 07/25/2022   TRIG 294 (H) 07/25/2022   HDL 34 (L) 07/25/2022   CHOLHDL 5.0 07/25/2022   VLDL 59 (H) 07/25/2022   LDLCALC 78 07/25/2022   LDLCALC 109 (H) 01/05/2017    Physical Findings: AIMS: 0 CIWA:  CIWA-Ar Total: 1   Musculoskeletal: Strength & Muscle Tone: within normal limits Gait & Station: normal  Psychiatric Specialty Exam:  Presentation  General Appearance: Casually dressed, adequate hygiene   Eye Contact:Minimal   Speech:Rambling at times but less verbose; normal fluency, not pressured; monotone   Speech Volume:Normal   Mood and Affect  Mood:Appears aloof- described as "angry" but improving   Affect:constricted, guarded, odd   Thought Process  Thought Processes:Linear for most of interview; circumstantial at times   Descriptions of Associations:Intact   Orientation:Full (Time, Place and Person)   Thought Content:Reports fewer ruminations about past events; denies SI, HI, AVH, paranoia, ideas of reference, first rank symptoms, or magical thinking; is not grossly responding to internal stimuli but appears guarded on exam  Hallucinations:Hallucinations: None  Ideas of Reference:None   Suicidal Thoughts:Suicidal Thoughts: No  Homicidal Thoughts:Homicidal Thoughts: No   Sensorium  Memory:Immediate Fair   Judgment:Fair   Insight:Shallow  Executive Functions  Concentration:Fair   Attention Span:Fair   Recall:Fair   Fund of Knowledge:Fair   Language:Fair    Psychomotor Activity  Psychomotor Activity:Psychomotor Activity:  Normal   Assets  Assets:Communication Skills; Desire for Improvement; Housing; Physical Health   Physical Exam Vitals and nursing  note reviewed.  HENT:     Head: Normocephalic.  Pulmonary:     Effort: Pulmonary effort is normal.  Neurological:     General: No focal deficit present.     Mental Status: He is alert.     Review of Systems  Respiratory:  Negative for shortness of breath.   Cardiovascular:  Negative for chest pain.  Gastrointestinal:  Negative for constipation, diarrhea, nausea and vomiting.  Neurological:  Negative for dizziness and headaches.   Blood pressure 127/70, pulse (!) 106, temperature 98.2 F (36.8 C), temperature source Oral, resp. rate 16, height 5\' 10"  (1.778 m), weight 103 kg, SpO2 98 %. Body mass index is 32.57 kg/m.   ASSESSMENT AND PLAN Mort Smelser is a 46 y.o. male  with psychiatric hx per EHR of schizophrenia vs schizoaffective d/o and self-reported h/o bipolar d/o. Based on his report of recent symptoms, he may have had recent bipolar mixed episode and on clinical exam is odd, aloof, and appears paranoid. Given his history per EHR, I am highly suspect this is schizoaffective d/o bipolar type. He reports he has been in remission from Xanax use, alcohol use, and THC use for 3.5 months. This is hospitalization day 2.  PLAN Safety and Monitoring: voluntary admission to inpatient psychiatric unit for safety, stabilization and treatment Daily contact with patient to assess and evaluate symptoms and progress in treatment Patient's case to be discussed in multi-disciplinary team meeting Observation Level : q15 minute checks Vital signs: q12 hours Precautions: suicide, elopement, and assault   Psychiatric Problems Psychiatric Diagnoses and Treatment:              Schizoaffective d/o bipolar type (r/o schizophrenia by hx; r/o bipolar II MRE mixed with psychotic features)             --Depakote ER 1000mg  qhs titrating up as needed for mood  stabilization  r/b/se/a to VPA discussed including risk of weight gain, liver issues, and need for CBC, LFT and VPA level monitoring Admission CBC WNL and admission AST 23 and ALT 28 - will repeat CBC, LFTs and VPA in 3-4 days after starting med             -- Titrate up on Seroquel to 200mg  qhs for mood stabilization and concern for paranoia (he consents to dose increase) r/b/se/a to atypical antipsychotic use including risk of developing weight gain, EPS/TD, and metabolic syndrome were discussed and he consents to med trial A1c 5.3, QTC 54, and Cholesterol 171, LDL 78, HDL 34, Triglycerides 294, VLDL 59.             -- Discussed potential addition of antidepressant for residual mood issues once he has adequate mood stabilizer dose and he declines at this time - hope is titration up on Seroquel will help with depressive sx \  -- Encouraged patient to participate in unit milieu and in scheduled group therapies  -- Will continue to try to reach mother with his consent for collateral and safety planning                         Alcohol use d/o in early remission Cannabis use d/o in early remission R/o anxiolytic/sedative/hypnotic use d/o  -- PDMP reviewed and he last filled Xanax 0.5mg  30 tabs in May 2023 -- Encouraged to continue abstinence from substances and to continue AA and NA              Medical Problems  Tobacco Use Disorder             -- Nicotine gum PRN             -- Smoking cessation encouraged                          HTN  -- patient denies home medications, most recent BP is 127/70. Continue to monitor    GERD  -- Protonix 40mg  daily  Hypertriglyceridemia -- Will discuss need for weight loss, increased exercise and dietary changes with patient - will need PCP f/u after discharge for trending while on an atypical antipsychotic   Admission labs reviewed: TSH 3.607, CBC WNL, CMP WNL other than glucose 111, UDS negative, respiratory panel negative, EKG NSR with QTC ,  lipid HDL 34, Triglycerides 294, VLDL 59. , A1c 5.3.   PRNs Tylenol 650 mg for mild pain Maalox/Mylanta 30 mL for indigestion Hydroxyzine 25 mg tid for anxiety Milk of Magnesia 30 mL for constipation Trazodone 50 mg for sleep   4. Discharge Planning: Social work and case management to assist with discharge planning and identification of hospital follow-up needs prior to discharge Estimated LOS: 5-7 days Discharge Concerns: Need to establish a safety plan; Medication compliance and effectiveness Discharge Goals: Return home with outpatient referrals for mental health follow-up including medication management/psychotherapy   , Medical Student 07/25/2022, 11:04 AM   Attestation for Student Documentation:  I certify that I saw and interviewed the patient together with the medical student and was present for the duration of the interview.  I reviewed the medical record.  I performed or reperformed the mental status examination of the patient as indicated.  I formulated the assessment and plan of treatment as documented above with edits. 07/27/2022, MD, Bartholomew Crews

## 2022-07-25 NOTE — Group Note (Signed)
Recreation Therapy Group Note   Group Topic:Team Building  Group Date: 07/25/2022 Start Time: 0935 End Time: 1010 Facilitators: Mykaila Blunck-McCall, LRT,CTRS Location: [address] Dayroom   Goal Area(s) Addresses:  Patient will effectively work with peer towards shared goal.  Patient will identify skills used to make activity successful.  Patient will identify how skills used during activity can be used to reach post d/c goals.   Group Description: Straw Bridge. In teams of 3-5, patients were given 15 plastic drinking straws and an equal length of masking tape. Using the materials provided, patients were instructed to build a free standing bridge-like structure to suspend an everyday item (ex: puzzle box) off of the floor or table surface. All materials were required to be used by the team in their design. LRT facilitated post-activity discussion reviewing team process. Patients were encouraged to reflect how the skills used in this activity can be generalized to daily life post discharge.   Affect/Mood: Appropriate   Participation Level: Engaged   Participation Quality: Independent   Behavior: Appropriate   Speech/Thought Process: Focused   Insight: Good   Judgement: Good   Modes of Intervention: STEM Activity   Patient Response to Interventions:  Engaged   Education Outcome:  Acknowledges education and In group clarification offered    Clinical Observations/Individualized Feedback: Pt was focused on trying to complete the activity. Pt eventually gave up trying to put the bridge together.  Pt was appropriate during group session.    Plan: Continue to engage patient in RT group sessions 2-3x/week.   Nawaal Alling-McCall, LRT,CTRS 07/25/2022 1:06 PM

## 2022-07-25 NOTE — Plan of Care (Signed)
Patient provided verbal consent of allowing [place] to contact his mother, [name] at [phone]. Attempted to contact mother, did not answer. Will re-attempt at a later time.

## 2022-07-25 NOTE — Progress Notes (Signed)
Pt denied SI/HI/AVH this morning. When asked if pt was having thoughts about hurting/ killing others (HI), pt stated; "not really". When asked to clarify his response, the pt denied HI. Pt rates his anxiety and depression a 0/10. Pt avoids eye contact throughout interaction. Pt has remained calm and cooperative throughout the shift and is seen participating in activities on the milieu. RN provided support and encouragement to patient. Pt given scheduled medications as prescribed. Q15 min checks verified for safety. RN will continue to monitor pt's progress and provide assistance as indicated. Pt is safe on the unit. Will continue to monitor.   07/25/22 1207  Psychosocial Assessment  Affect Depressed;Anxious;Flat  Speech Logical/coherent  Interaction Cautious  Motor Activity Slow  Appearance/Hygiene Unremarkable  Behavior Characteristics Cooperative;Appropriate to situation  Mood Depressed;Anxious;Sad  Thought Process  Coherency WDL  Content WDL  Delusions None reported or observed  Perception WDL  Hallucination None reported or observed  Judgment Limited  Confusion None  Danger to Self  Current suicidal ideation? Denies  Agreement Not to Harm Self Yes  Description of Agreement verbally contracts for safety  Danger to Others  Danger to Others None reported or observed

## 2022-07-25 NOTE — Progress Notes (Signed)
Pt came to the nursing station upset about the MHT on the hall. Writer gave the pt all the information, but pt continued to talk about the situation, then pt got upset with Probation officer, when Probation officer asked if the pt needed anything else. Pt has done this in the past and pt appeared to not want a resolution . Pt has Hx of speaking derogatory and cursing at Probation officer.

## 2022-07-25 NOTE — Group Note (Signed)
LCSW Group Therapy Note  Group Date: 07/25/2022 Start Time: 1300 End Time: 1400   Type of Therapy and Topic:  Group Therapy - Healthy vs Unhealthy Coping Skills  Participation Level:  Minimal   Description of Group The focus of this group was to determine what unhealthy coping techniques typically are used by group members and what healthy coping techniques would be helpful in coping with various problems. Patients were guided in becoming aware of the differences between healthy and unhealthy coping techniques. Patients were asked to identify 2-3 healthy coping skills they would like to learn to use more effectively.  Therapeutic Goals Patients learned that coping is what human beings do all day long to deal with various situations in their lives Patients defined and discussed healthy vs unhealthy coping techniques Patients identified their preferred coping techniques and identified whether these were healthy or unhealthy Patients determined 2-3 healthy coping skills they would like to become more familiar with and use more often. Patients provided support and ideas to each other   Summary of Patient Progress:  During group, [name] expressed very little Patient proved open to input from peers and feedback from [place]. Patient demonstrated minimal insight into the subject matter, was respectful of peers, and participated throughout the entire session.   Therapeutic Modalities Cognitive Behavioral Therapy Motivational Interviewing  Windle Guard, LCSW 07/25/2022  4:17 PM

## 2022-07-25 NOTE — BH IP Treatment Plan (Signed)
Interdisciplinary Treatment and Diagnostic Plan Update  07/25/2022 Time of Session: 9:20am  Albert [name] MRN: [id number]  Principal Diagnosis: Schizoaffective disorder, bipolar type [facility])  Secondary Diagnoses: Principal Problem:   Schizoaffective disorder, bipolar type ([place])   Current Medications:  Current Facility-Administered Medications  Medication Dose Route Frequency Provider Last Rate Last Admin   acetaminophen (TYLENOL) tablet 650 mg  650 mg Oral Q6H PRN Niel Hummer, NP       alum & mag hydroxide-simeth (MAALOX/MYLANTA) 200-200-20 MG/5ML suspension 30 mL  30 mL Oral Q4H PRN Niel Hummer, NP       divalproex (DEPAKOTE ER) 24 hr tablet 1,000 mg  1,000 mg Oral QHS Nelda Marseille, Amy E, MD   1,000 mg at 07/24/22 2112   hydrOXYzine (ATARAX) tablet 25 mg  25 mg Oral Q6H PRN Niel Hummer, NP   25 mg at 07/23/22 2352   OLANZapine zydis (ZYPREXA) disintegrating tablet 5 mg  5 mg Oral Q8H PRN Harlow Asa, MD       And   LORazepam (ATIVAN) tablet 1 mg  1 mg Oral PRN Harlow Asa, MD       And   ziprasidone (GEODON) injection 20 mg  20 mg Intramuscular PRN Nelda Marseille, Amy E, MD       magnesium hydroxide (MILK OF MAGNESIA) suspension 30 mL  30 mL Oral Daily PRN Elmarie Shiley A, NP       nicotine polacrilex (NICORETTE) gum 2 mg  2 mg Oral PRN Nelda Marseille, Amy E, MD       pantoprazole (PROTONIX) EC tablet 40 mg  40 mg Oral Daily Nelda Marseille, Amy E, MD   40 mg at 07/25/22 0831   QUEtiapine (SEROQUEL) tablet 100 mg  100 mg Oral QHS Nelda Marseille, Amy E, MD   100 mg at 07/24/22 2112   traZODone (DESYREL) tablet 50 mg  50 mg Oral QHS PRN Niel Hummer, NP   50 mg at 07/23/22 2352   PTA Medications: Medications Prior to Admission  Medication Sig Dispense Refill Last Dose   ibuprofen (ADVIL) 200 MG tablet Take 800 mg by mouth every 6 (six) hours as needed for mild pain.   Past Week   omeprazole (PRILOSEC OTC) 20 MG tablet Take 20 mg by mouth daily as needed (For heartburn or acid  reflux).   07/22/2022    Patient Stressors: Medication change or noncompliance   Other: "rejection from loves ones"    Patient Strengths: Capable of independent living  Communication skills  General fund of knowledge  Motivation for treatment/growth  Physical Health   Treatment Modalities: Medication Management, Group therapy, Case management,  1 to 1 session with clinician, Psychoeducation, Recreational therapy.   Physician Treatment Plan for Primary Diagnosis: Schizoaffective disorder, bipolar type ([place]) Long Term Goal(s): Improvement in symptoms so as ready for discharge   Short Term Goals: Ability to verbalize feelings will improve Ability to disclose and discuss suicidal ideas Ability to demonstrate self-control will improve Ability to identify and develop effective coping behaviors will improve Ability to maintain clinical measurements within normal limits will improve  Medication Management: Evaluate patient's response, side effects, and tolerance of medication regimen.  Therapeutic Interventions: 1 to 1 sessions, Unit Group sessions and Medication administration.  Evaluation of Outcomes: Not Met  Physician Treatment Plan for Secondary Diagnosis: Principal Problem:   Schizoaffective disorder, bipolar type ([place])  Long Term Goal(s): Improvement in symptoms so as ready for discharge   Short Term Goals: Ability to verbalize feelings will  improve Ability to disclose and discuss suicidal ideas Ability to demonstrate self-control will improve Ability to identify and develop effective coping behaviors will improve Ability to maintain clinical measurements within normal limits will improve     Medication Management: Evaluate patient's response, side effects, and tolerance of medication regimen.  Therapeutic Interventions: 1 to 1 sessions, Unit Group sessions and Medication administration.  Evaluation of Outcomes: Not Met   RN Treatment Plan for Primary Diagnosis:  Schizoaffective disorder, bipolar type ([place]) Long Term Goal(s): Knowledge of disease and therapeutic regimen to maintain health will improve  Short Term Goals: Ability to remain free from injury will improve, Ability to participate in decision making will improve, Ability to verbalize feelings will improve, Ability to disclose and discuss suicidal ideas, and Ability to identify and develop effective coping behaviors will improve  Medication Management: RN will administer medications as ordered by provider, will assess and evaluate patient's response and provide education to patient for prescribed medication. RN will report any adverse and/or side effects to prescribing provider.  Therapeutic Interventions: 1 on 1 counseling sessions, Psychoeducation, Medication administration, Evaluate responses to treatment, Monitor vital signs and CBGs as ordered, Perform/monitor CIWA, COWS, AIMS and Fall Risk screenings as ordered, Perform wound care treatments as ordered.  Evaluation of Outcomes: Not Met   LCSW Treatment Plan for Primary Diagnosis: Schizoaffective disorder, bipolar type ([place]) Long Term Goal(s): Safe transition to appropriate next level of care at discharge, Engage patient in therapeutic group addressing interpersonal concerns.  Short Term Goals: Engage patient in aftercare planning with referrals and resources, Increase social support, Increase emotional regulation, Facilitate acceptance of mental health diagnosis and concerns, Identify triggers associated with mental health/substance abuse issues, and Increase skills for wellness and recovery  Therapeutic Interventions: Assess for all discharge needs, 1 to 1 time with Social worker, Explore available resources and support systems, Assess for adequacy in community support network, Educate family and significant other(s) on suicide prevention, Complete Psychosocial Assessment, Interpersonal group therapy.  Evaluation of Outcomes: Not  Met   Progress in Treatment: Attending groups: Yes. Participating in groups: Yes. Taking medication as prescribed: Yes. Toleration medication: Yes. Family/Significant other contact made: Yes, individual(s) contacted:  Mother  Patient understands diagnosis: No. Discussing patient identified problems/goals with staff: Yes. Medical problems stabilized or resolved: Yes. Denies suicidal/homicidal ideation: Yes. Issues/concerns per patient self-inventory: No.   New problem(s) identified: No, Describe:  None   New Short Term/Long Term Goal(s): medication stabilization, elimination of SI thoughts, development of comprehensive mental wellness plan.   Patient Goals: "To get on the right medications   Discharge Plan or Barriers: Patient recently admitted. CSW will continue to follow and assess for appropriate referrals and possible discharge planning.   Reason for Continuation of Hospitalization: Anxiety Depression Medication stabilization Suicidal ideation  Estimated Length of Stay: 3 to 7 days   Last [place] Suicide Severity Risk Score: Flowsheet Row Admission (Current) from OP Visit from 07/23/2022 in [facility] ED from 01/12/2022 in [facility] ED from 05/07/2021 in [place] DEPT  C-SSRS RISK CATEGORY Low Risk No Risk No Risk       Last PHQ 2/9 Scores:     No data to display          Scribe for Treatment Team: Darleen Crocker, Latanya Presser 07/25/2022 11:24 AM

## 2022-07-25 NOTE — Group Note (Signed)
Recreation Therapy Group Note   Group Topic:Other  Group Date: 07/25/2022 Start Time: 6967 End Time: 1430 Facilitators: Cydnie Deason-McCall, LRT,CTRS Location: 300 Hall Dayroom   Activity Description/Intervention: Therapeutic Drumming. Patients, with peers and staff, were given the opportunity to engage in a leader facilitated [place] with staff from the [employer], in partnership with [employer].    Affect/Mood: Appropriate   Participation Level: Engaged   Participation Quality: Independent   Behavior: Appropriate   Speech/Thought Process: Focused   Insight: Good   Judgement: Good   Modes of Intervention: Music   Patient Response to Interventions:  Engaged   Education Outcome:  Acknowledges education and In group clarification offered    Clinical Observations/Individualized Feedback: Pt was attentive and engaged.  Pt played along with instructor during group session.  Pt was appropriate throughout group.     Plan: Continue to engage patient in RT group sessions 2-3x/week.   Akeia Perot-McCall, LRT,CTRS 07/25/2022 3:44 PM

## 2022-07-26 MED ORDER — QUETIAPINE FUMARATE 300 MG PO TABS
300.0000 mg | ORAL_TABLET | Freq: Every day | ORAL | Status: DC
  Administered 2022-07-26 – 2022-07-31 (×6): 300 mg via ORAL
  Filled 2022-07-26 (×9): qty 1

## 2022-07-26 NOTE — Progress Notes (Addendum)
[facility] MD Progress Note  07/26/2022 9:48 AM Dudley Mages  MRN:  [id number] Principal Problem: Schizoaffective disorder, bipolar type ([place]) Diagnosis: Principal Problem:   Schizoaffective disorder, bipolar type ([place])  Reason for Admission: Aland Chestnutt is a 46 y.o. male with self-reported past psychiatric hx of bipolar depression, who presented to [facility] voluntarily with thoughts of "hopelessness, sadness, betrayal, and rejection." At his intake assessment as a walk-in to [facility], there was concern for possible suicidal ideation and the patient voluntarily was admitted to [facility] for acute stabilization. This is hospitalization day 3.  Yesterday's Psychiatry Team's Recommendation: - Continue VPA ER 1000mg  qhs - Titrate up on Seroquel to 200mg  qhs  Chart Review Past 24 hours of patient's chart was reviewed.  Patient is  compliant with scheduled meds. Required Agitation PRNs: none Per RN notes, patient reported to nursing secretary that he was not getting along with his roommate, he reportedly became agitated with secretary telling her "Just get the fuck out!" Pt observed calling unit stating there is not enough staff, patients are on the floor, and then when asked who the caller was he reported "this is [name] that is all you need to know."  Total sleep time not documented: 7 hours   Subjective: Patient was interviewed in his room with attending psychiatrist. He recounts events last night with his roommate and staff.  Patient states he wanted to read at night so he turned on his overhead light and the roommate told him to turn it off. Patient then breathed loudly and patient states roommate told him to "shut up." He states he then involved night staff member who also told him to "shut up" and "do not breathe." He admits he became angry and paranoid when he felt staff was not responding to his concerns. He denies any feelings of paranoia or staff conspiring against him today. Patient states he has  been eating well. He denies any SI/HI, AVH, thought insertion, withdrawal, broadcasting, receiving messages from electronic devices or any cravings/withdrawal from substance use. He denies any C/P, HA, and n/v/d. He reports last BM as two days ago. He reports feeling a little better today and feeling more hopeful. Patient states he is not ruminating as much on previous issues that were bothersome prior to admission. He denies medication side-effects and we discussed plans for continue dose titration up on his Seroquel. We discussed plans to move him to a private room once there are discharges today which he agrees to. He reports that his depressive symptoms are improving. Patient remains aloof and elusive in responses. He is glad we started his Depakote and noticed that we titrated up his Seroquel but states he was "still able to get out of bed." He is amenable to having VPA levels checked on Saturday.   Total Time Spent in Direct Patient Care:  I personally spent 30 minutes on the unit in direct patient care. The direct patient care time included face-to-face time with the patient, reviewing the patient's chart, communicating with other professionals, and coordinating care. Greater than 50% of this time was spent in counseling or coordinating care with the patient regarding goals of hospitalization, psycho-education, and discharge planning needs.   Past Psychiatric History: See H&P   Past Medical History:  Past Medical History:  Diagnosis Date   Alcoholism ([place])    Bipolar disorder ([place])    Drug abuse ([place])    xanax addiction 5 years ago   Heartburn    Medical non-compliance    Paranoid schizophrenia (  HCC)    Schizophrenia (HCC)    Seizures (HCC)     Past Surgical History:  Procedure Laterality Date   NO PAST SURGERIES     Family History: See H&P Family History  Problem Relation Age of Onset   Thyroid disease Mother    Depression Maternal Aunt    Family Psychiatric History: see H&P    Social History:  Social History   Substance and Sexual Activity  Alcohol Use Not Currently   Comment: has been sober since June 4th     Social History   Substance and Sexual Activity  Drug Use Not Currently   Types: Marijuana, Benzodiazepines   Comment: THC: was smoking 1 ounce every day, last use: April & xanax use was 10 mg every day for "a few months"    Social History   Socioeconomic History   Marital status: Single    Spouse name: Not on file   Number of children: Not on file   Years of education: Not on file   Highest education level: Not on file  Occupational History    Comment: Unclear  Tobacco Use   Smoking status: Former    Types: Cigarettes    Quit date: 02/21/2022    Years since quitting: 0.4   Smokeless tobacco: Never   Tobacco comments:    He stopped smoking in April of 2023. He said that he used to smoke "a few packs per day."   Vaping Use   Vaping Use: Never used  Substance and Sexual Activity   Alcohol use: Not Currently    Comment: has been sober since June 4th   Drug use: Not Currently    Types: Marijuana, Benzodiazepines    Comment: THC: was smoking 1 ounce every day, last use: April & xanax use was 10 mg every day for "a few months"   Sexual activity: Not Currently  Other Topics Concern   Not on file  Social History Narrative   Pt reported that he lives in a house apartment.   Social Determinants of Health   Financial Resource Strain: Not on file  Food Insecurity: Food Insecurity Present (07/23/2022)   Hunger Vital Sign    Worried About Running Out of Food in the Last Year: Sometimes true    Ran Out of Food in the Last Year: Sometimes true  Transportation Needs: No Transportation Needs (07/23/2022)   PRAPARE - Administrator, Civil Service (Medical): No    Lack of Transportation (Non-Medical): No  Physical Activity: Not on file  Stress: Not on file  Social Connections: Not on file    Current Medications: Current  Facility-Administered Medications  Medication Dose Route Frequency Provider Last Rate Last Admin   acetaminophen (TYLENOL) tablet 650 mg  650 mg Oral Q6H PRN Thermon Leyland, NP       alum & mag hydroxide-simeth (MAALOX/MYLANTA) 200-200-20 MG/5ML suspension 30 mL  30 mL Oral Q4H PRN Fransisca Kaufmann A, NP       divalproex (DEPAKOTE ER) 24 hr tablet 1,000 mg  1,000 mg Oral QHS Mason Jim, Kahleel Fadeley E, MD   1,000 mg at 07/25/22 2145   hydrOXYzine (ATARAX) tablet 25 mg  25 mg Oral Q6H PRN Fransisca Kaufmann A, NP   25 mg at 07/23/22 2352   OLANZapine zydis (ZYPREXA) disintegrating tablet 5 mg  5 mg Oral Q8H PRN Comer Locket, MD       And   LORazepam (ATIVAN) tablet 1 mg  1 mg Oral PRN  Comer Locket, MD       And   ziprasidone (GEODON) injection 20 mg  20 mg Intramuscular PRN Mason Jim, Malynn Lucy E, MD       magnesium hydroxide (MILK OF MAGNESIA) suspension 30 mL  30 mL Oral Daily PRN Fransisca Kaufmann A, NP       nicotine polacrilex (NICORETTE) gum 2 mg  2 mg Oral PRN Mason Jim, Yee Gangi E, MD       pantoprazole (PROTONIX) EC tablet 40 mg  40 mg Oral Daily Mason Jim, Shirlette Scarber E, MD   40 mg at 07/26/22 0806   QUEtiapine (SEROQUEL) tablet 200 mg  200 mg Oral QHS Mason Jim, Aloha Bartok E, MD   200 mg at 07/25/22 2145   traZODone (DESYREL) tablet 50 mg  50 mg Oral QHS PRN Thermon Leyland, NP   50 mg at 07/23/22 2352    Lab Results:  No results found for this or any previous visit (from the past 24 hour(s)).   Blood Alcohol level:  Lab Results  Component Value Date   ETH <10 12/16/2020   ETH <10 01/30/2020    Metabolic Disorder Labs: Lab Results  Component Value Date   HGBA1C 5.3 07/25/2022   MPG 105.41 07/25/2022   MPG 100 01/05/2017   Lab Results  Component Value Date   PROLACTIN 30.4 (H) 01/06/2017   PROLACTIN 13.1 01/05/2017   Lab Results  Component Value Date   CHOL 171 07/25/2022   TRIG 294 (H) 07/25/2022   HDL 34 (L) 07/25/2022   CHOLHDL 5.0 07/25/2022   VLDL 59 (H) 07/25/2022   LDLCALC 78 07/25/2022    LDLCALC 109 (H) 01/05/2017    Physical Findings: AIMS: 0 CIWA:  CIWA-Ar Total: 1   Musculoskeletal: Strength & Muscle Tone: within normal limits Gait & Station: normal  Psychiatric Specialty Exam:  Presentation  General Appearance: Casually dressed, adequate hygiene   Eye Contact:Minimal   Speech:Rambling at times but less verbose; normal fluency, not pressured; monotone   Speech Volume:Normal   Mood and Affect  Mood:Appears aloof- describes mood as improving   Affect:constricted, guarded  Thought Process  Thought Processes:Ruminative about events with roommate and staff; overall more linear today   Descriptions of Associations:Intact   Orientation:Full (Time, Place and Person)   Thought Content:Reports fewer ruminations about past events but is ruminative about interactions with roommate and staff last night and admits to paranoia last night; denies SI, HI, AVH,  ideas of reference, first rank symptoms, or magical thinking; is not grossly responding to internal stimuli but appears guarded on exam  Hallucinations:Denied  Ideas of Reference:None   Suicidal Thoughts:Denied  Homicidal Thoughts:Denied   Sensorium  Memory: Fair  Judgment:Fair   Insight:Shallow  Executive Functions  Concentration:Fair   Attention Span:Fair   Recall:Fair   Fund of Knowledge:Fair   Language:Fair    Psychomotor Activity  Psychomotor Activity:steady gait, no tremors or akathisias noted   Assets  Assets:Communication Skills; Desire for Improvement; Housing; Physical Health   Physical Exam Vitals and nursing note reviewed.  HENT:     Head: Normocephalic.  Pulmonary:     Effort: Pulmonary effort is normal.  Neurological:     General: No focal deficit present.     Mental Status: He is alert.     Review of Systems  Cardiovascular:  Negative for chest pain.  Gastrointestinal:  Negative for constipation, diarrhea, nausea and vomiting.  Neurological:   Negative for dizziness and headaches.   Blood pressure 125/73, pulse (!) 117, temperature 98.2  F (36.8 C), temperature source Oral, resp. rate 20, height 5\' 10"  (1.778 m), weight 103 kg, SpO2 98 %. Body mass index is 32.57 kg/m.   ASSESSMENT AND PLAN Zarek Relph is a 46 y.o. male  with psychiatric hx per EHR of schizophrenia vs schizoaffective d/o and self-reported h/o bipolar d/o. He had an outburst with roommate and staff last night and displayed paranoid behaviors. We will continue to titrate up on medications and observe for improvement.   PLAN Safety and Monitoring: voluntary admission to inpatient psychiatric unit for safety, stabilization and treatment Daily contact with patient to assess and evaluate symptoms and progress in treatment Patient's case to be discussed in multi-disciplinary team meeting Observation Level : q15 minute checks Vital signs: q12 hours Precautions: suicide, elopement, and assault   Psychiatric Problems Psychiatric Diagnoses and Treatment:              Schizoaffective d/o bipolar type (r/o schizophrenia by hx; r/o bipolar II MRE mixed with psychotic features)             --Depakote ER 1000mg  qhs titrating up as needed for mood stabilization  r/b/se/a to VPA discussed including risk of weight gain, liver issues, and need for CBC, LFT and VPA level monitoring Admission CBC WNL and admission AST 23 and ALT 28 - will repeat CBC, LFTs and VPA level Saturday              --  Increase Seroquel to 300mg  qhs for mood stabilization and paranoia (he consents to dose increase) r/b/se/a to atypical antipsychotic use including risk of developing weight gain, EPS/TD, and metabolic syndrome were discussed and he consents to med trial A1c 5.3, QTC , and Cholesterol 171, LDL 78, HDL 34, Triglycerides 294, VLDL 59.             -- Discussed potential addition of antidepressant for residual mood issues once he has adequate mood stabilizer dose and he declines at  this time - hope is titration up on Seroquel will help with depressive sx   -- Encouraged patient to participate in unit milieu and in scheduled group therapies  -- Will continue to try to reach mother with his consent for collateral and safety planning                         Alcohol use d/o in early remission Cannabis use d/o in early remission R/o anxiolytic/sedative/hypnotic use d/o  -- PDMP reviewed and he last filled Xanax 0.5mg  30 tabs in May 2023 -- Encouraged to continue abstinence from substances and to continue AA and NA              Medical Problems    Tobacco Use Disorder             -- Nicotine gum PRN             -- Smoking cessation encouraged                          HTN  -- patient denies home medications, most recent BP is 125/73- Continue to monitor    GERD  -- Protonix 40mg  daily  Hypertriglyceridemia -- Will discuss need for weight loss, increased exercise and dietary changes with patient - will need PCP f/u after discharge for trending while on an atypical antipsychotic   Admission labs reviewed: TSH 3.607, CBC WNL, CMP WNL other than glucose 111, UDS negative, respiratory  panel negative, EKG NSR with QTC 434ms, lipid HDL 34, Triglycerides 294, VLDL 59. , A1c 5.3.   PRNs Tylenol 650 mg for mild pain Maalox/Mylanta 30 mL for indigestion Hydroxyzine 25 mg tid for anxiety Milk of Magnesia 30 mL for constipation Trazodone 50 mg for sleep   4. Discharge Planning: Social work and case management to assist with discharge planning and identification of hospital follow-up needs prior to discharge Estimated LOS: 5-7 days Discharge Concerns: Need to establish a safety plan; Medication compliance and effectiveness Discharge Goals: Return home with outpatient referrals for mental health follow-up including medication management/psychotherapy   Bedelia PersonDesai Shivani, Medical Student 07/26/2022, 9:48 AM  Attestation for Student Documentation:   I certify that I saw and  interviewed the patient together with the medical student and was present for the duration of the interview.  I reviewed the medical record.  I performed or reperformed the mental status examination of the patient as indicated.  I formulated the assessment and plan of treatment as documented above with edits. Bartholomew CrewsAmy Harmoney Sienkiewicz, MD, Celene SkeenFAPA

## 2022-07-26 NOTE — Plan of Care (Signed)
  Problem: Education: Goal: Knowledge of [facility] Education information/materials will improve Outcome: Progressing Goal: Emotional status will improve Outcome: Progressing Goal: Mental status will improve Outcome: Progressing Goal: Verbalization of understanding the information provided will improve Outcome: Progressing   Problem: Activity: Goal: Interest or engagement in activities will improve Outcome: Progressing Goal: Sleeping patterns will improve Outcome: Progressing   Problem: Coping: Goal: Ability to verbalize frustrations and anger appropriately will improve Outcome: Progressing Goal: Ability to demonstrate self-control will improve Outcome: Progressing   Problem: Health Behavior/Discharge Planning: Goal: Identification of resources available to assist in meeting health care needs will improve Outcome: Progressing Goal: Compliance with treatment plan for underlying cause of condition will improve Outcome: Progressing   Problem: Physical Regulation: Goal: Ability to maintain clinical measurements within normal limits will improve Outcome: Progressing   Problem: Safety: Goal: Periods of time without injury will increase Outcome: Progressing   Problem: Education: Goal: Ability to make informed decisions regarding treatment will improve Outcome: Progressing   Problem: Coping: Goal: Coping ability will improve Outcome: Progressing   Problem: Health Behavior/Discharge Planning: Goal: Identification of resources available to assist in meeting health care needs will improve Outcome: Progressing   Problem: Medication: Goal: Compliance with prescribed medication regimen will improve Outcome: Progressing   Problem: Self-Concept: Goal: Ability to disclose and discuss suicidal ideas will improve Outcome: Progressing Goal: Will verbalize positive feelings about self Outcome: Progressing   Problem: Education: Goal: Utilization of techniques to improve  thought processes will improve Outcome: Progressing Goal: Knowledge of the prescribed therapeutic regimen will improve Outcome: Progressing   Problem: Activity: Goal: Interest or engagement in leisure activities will improve Outcome: Progressing Goal: Imbalance in normal sleep/wake cycle will improve Outcome: Progressing   Problem: Coping: Goal: Coping ability will improve Outcome: Progressing Goal: Will verbalize feelings Outcome: Progressing   Problem: Health Behavior/Discharge Planning: Goal: Ability to make decisions will improve Outcome: Progressing Goal: Compliance with therapeutic regimen will improve Outcome: Progressing   Problem: Role Relationship: Goal: Will demonstrate positive changes in social behaviors and relationships Outcome: Progressing   Problem: Safety: Goal: Ability to disclose and discuss suicidal ideas will improve Outcome: Progressing Goal: Ability to identify and utilize support systems that promote safety will improve Outcome: Progressing   Problem: Self-Concept: Goal: Will verbalize positive feelings about self Outcome: Progressing Goal: Level of anxiety will decrease Outcome: Progressing   

## 2022-07-26 NOTE — Progress Notes (Signed)
The patient rated his day as a 7 out of 10 since he spoke with his doctor today. His positive event for the day is that he was able to read from his book.

## 2022-07-26 NOTE — Plan of Care (Signed)
Patient provided verbal consent of allowing [place] to contact his mother, [name] at [phone]. Spoke to mother of patient regarding his baseline status and she says this has been an ongoing issue for 25 years. Mother states patient cannot hold a job, cannot go out of the house, tries to go to [employer] which is his job. She states he previously used to see psychiatrist and therapist but she is not aware of his follow up.   She states he discontinued his medications because he stopped feeling anything. She states they do not live together and the last time she saw him was 2-3 weeks ago. Mother reports that at this time, he seemed coherent, very low key, had trouble with AA/NA meetings, but did not disclose issues with other people.   Mother states he was a difficult child compared to his twin sister but mental illness was not invented then for children so she did not know what was going on but knew he was not normal. Mother states she does not remember what happened earlier in childhood but states she started having him see psychiatrists and therapists at age 21-9. She does not know more about diagnoses as a child and does not elaborate much on his childhood. She states she has not had much follow up with him as an adult because he left home at 25 but finished high school in [place] where he lived with his dad.   She reports he has been in and out of halfway houses for years. She reports his issues as a "cluster fuck." She describes ongoing issues with patient's marijuana, alcohol, and meth "ruins his teeth" use for coping with his problems. When asked to clarify about his previous diagnoses, she does not have a clear timeline.  Mother states he was originally dx with ADHD but was never on medications for this. Mother refers to ADHD diagnosis as BS. She states they also said he has bipolar d/o, schizophrenia or schizoaffective disorder. When asked if she agrees with any of the diagnoses she states the  names of the illnesses do not matter.  I asked if she agrees with his diagnosis of bipolar d/o and asked if he has ever had periods of times when he can go days without sleeping, acts impulsively and she states "I do not know." Mom reports previous "manic" behaviors observed as calling family members over and over again. When asked what he calls about, she states he is doing a brain dump, or comments on what he "imagined happening" now or 30 years ago. She reports he has disclosed that he hears people's thoughts and they are saying negative things about him. Mother states most recently he has said this within the last year.   She states he may have engaged in self harm and suicide attempts in the past but she is unclear on details. She states about 15 or more years ago, he had a noose ready and a chair, but was then hospitalized. She states she wants weekly check-ins for patient as a resource. Mother reports that during [place], he was hospitalized almost every month in whichever hospital had a bed open all across [place]. She states zoom meetings/ PHP will not work for patient as he cannot get onto zoom. Mother states he needs one on one check-ins. I told mother that we would relay these concerns back to team.

## 2022-07-26 NOTE — Progress Notes (Signed)
Patient appears flat. Patient denies SI/HI/AVH. Patient complied with morning medication with no reported side effects. Pt reports fair appetite and sleep. Patient remains safe on Q58min checks and contracts for safety.       07/26/22 0925  Psych Admission Type (Psych Patients Only)  Admission Status Voluntary  Psychosocial Assessment  Patient Complaints Depression  Eye Contact Avoids  Facial Expression Flat  Affect Depressed  Speech Logical/coherent  Interaction Cautious;Attention-seeking  Motor Activity Slow  Appearance/Hygiene Unremarkable  Behavior Characteristics Cooperative;Calm  Mood Depressed;Anxious  Thought Process  Coherency WDL  Content WDL  Delusions None reported or observed  Perception WDL  Hallucination None reported or observed  Judgment Limited  Confusion None  Danger to Self  Current suicidal ideation? Denies  Self-Injurious Behavior No self-injurious ideation or behavior indicators observed or expressed   Agreement Not to Harm Self Yes  Description of Agreement verbal  Danger to Others  Danger to Others None reported or observed

## 2022-07-26 NOTE — [facility] Suicide Risk Assessment (Signed)
[facility] INPATIENT:  Family/Significant Other Suicide Prevention Education  Suicide Prevention Education:  Education Completed; Albert [name] [phone] (Mother) has been identified by the patient as the family member/significant other with whom the patient will be residing, and identified as the person(s) who will aid the patient in the event of a mental health crisis (suicidal ideations/suicide attempt).  With written consent from the patient, the family member/significant other has been provided the following suicide prevention education, prior to the and/or following the discharge of the patient.  The suicide prevention education provided includes the following: Suicide risk factors Suicide prevention and interventions National Suicide Hotline telephone number [facility] assessment telephone number [facility] Emergency Assistance [facility] and/or Residential Mobile Crisis Unit telephone number  Request made of family/significant other to: Remove weapons (e.g., guns, rifles, knives), all items previously/currently identified as safety concern.   Remove drugs/medications (over-the-counter, prescriptions, illicit drugs), all items previously/currently identified as a safety concern.  The family member/significant other verbalizes understanding of the suicide prevention education information provided.  The family member/significant other agrees to remove the items of safety concern listed above.  CSW spoke with Albert [name] who states that her son has been sad and "very low key" for the past 3 months.  She states that he then became manic and began calling all of his family members.  She states that her son stopped taking his medications 3 months ago and stated to her "doctor Albert [name] told me that I was cured and healed and I don't have to see anyone for medications anymore".  She states that he has no taken any medications since.  She reports that her son was previously  diagnosed with Bipolar, then Schizophrenia, and the Schizoaffective.  She states that she also has Dementia and cannot remember things clearly.  Albert [name] states that her son lives on his own and has no weapons or firearms in his home.  She states that she would like her son to have additional services outside of therapy, medication management, and AA meetings.  She also states that she would like these services to be in-person because he "does not do well with virtual meetings".  CSW completed SPE with Albert [name].   Albert [name] 07/26/2022, 2:38 PM

## 2022-07-26 NOTE — Group Note (Signed)
Date:  07/26/2022 Time:  11:19 AM  Group Topic/Focus:  Orientation:   The focus of this group is to educate the patient on the purpose and policies of crisis stabilization and provide a format to answer questions about their admission.  The group details unit policies and expectations of patients while admitted.    Participation Level:  Active  Participation Quality:  Appropriate  Affect:  Appropriate  Cognitive:  Appropriate  Insight: Appropriate  Engagement in Group:  Engaged  Modes of Intervention:  Discussion   Additional Comments:     Jerrye Beavers 07/26/2022, 11:19 AM

## 2022-07-26 NOTE — Group Note (Signed)
Date:  07/26/2022 Time:  5:00 PM  Group Topic/Focus:  Wellness Toolbox:   The focus of this group is to discuss various aspects of wellness, balancing those aspects and exploring ways to increase the ability to experience wellness.  Patients will create a wellness toolbox for use upon discharge.    Participation Level:  Active  Participation Quality:  Attentive  Affect:  Appropriate  Cognitive:  Appropriate  Insight: Good  Engagement in Group:  Engaged  Albert [name] 07/26/2022, 5:00 PM

## 2022-07-26 NOTE — Progress Notes (Signed)
   07/26/22 0515  Sleep  Number of Hours 7

## 2022-07-27 NOTE — Progress Notes (Signed)
Pt rates depression 0/10 and anxiety 0/10. Pt reports a good appetite, and no physical problems. Pt denies SI/HI/AVH and verbally contracts for safety. Provided support and encouragement. Pt safe on the unit. Q 15 minute safety checks continued.   

## 2022-07-27 NOTE — Group Note (Signed)
Date:  07/27/2022 Time:  1:22 PM  Group Topic/Focus:  Wellness Toolbox:   The focus of this group is to discuss various aspects of wellness, balancing those aspects and exploring ways to increase the ability to experience wellness.  Patients will create a wellness toolbox for use upon discharge.    Participation Level:  Minimal  Participation Quality:  Inattentive  Affect:  Blunted  Cognitive:  Lacking  Insight: Limited  Engagement in Group:  Lacking  Modes of Intervention:  Discussion  Additional Comments:     Jerrye Beavers 07/27/2022, 1:22 PM

## 2022-07-27 NOTE — Progress Notes (Addendum)
[facility] MD Progress Note  07/27/2022 10:59 AM Albert [name]  MRN:  [id number] Principal Problem: Schizoaffective disorder, bipolar type ([place]) Diagnosis: Principal Problem:   Schizoaffective disorder, bipolar type ([place])  Reason for Admission: Albert [name] is a 46 y.o. male with self-reported past psychiatric hx of bipolar depression, who presented to [facility] voluntarily with thoughts of "hopelessness, sadness, betrayal, and rejection." At his intake assessment as a walk-in to [facility], there was concern for possible suicidal ideation and the patient voluntarily was admitted to [facility] for acute stabilization. This is hospitalization day 4.  Yesterday's Psychiatry Team's Recommendation: - Continue VPA ER 1000mg  qhs - Start Seroquel to 300mg  qhs  Chart Review Past 24 hours of patient's chart was reviewed.  Patient is  compliant with scheduled meds. Required Agitation PRNs: none Per RN notes, patient rated his day a 7/10. He participated in group.  Total sleep time not documented  Subjective: Patient was interviewed with attending psychiatrist. Patient states his feelings of hopelessness have gotten better. He is irritable and responds initially  in short phrases stating "I am fine. It is all just fine." When asked why he is more irritable or angry patient makes eye contact and states "you would be angry too if you were on medications you do not need to be on" and they "stifle your creative process and emotions." Patient denies any medication side effects. Patient became angry again as we discussed potential medication changes. He states "medication is a punishment for others wrongdoings. Well just punish [name] then." He goes on to describe that he is already feeling "lethargic, tired, angry, and irritable, humble" and believes his thoughts have slowed down with current does of Seroquel. Patient is resistant to any discussion of increasing dose of Seroquel to further therapeutic range to better manage  his bipolar and schizoaffective disorder. Patient emphasizes that he came here to learn how to manage his issues without medications. He states he is only taking medications to appease doctors because "all doctors do is prescribe medications."  However, patient is amenable to increasing dose of Depakote if needed and checking levels on Saturday. He requests not to further increase his Seroquel at this time despite attempts to discuss reasons for dose titration or alternative medications that could be substituted in place of Seroquel.  When asked regarding feelings of paranoia which he mentioned yesterday, patient becomes angry and states he never said used the word "paranoid" and he is "very careful with his words." When asked about thought rumination, patient similarly gets angry and asks what we are referring to. We explain that he mentioned an incident from 1999 during his admission interview and how he was made upset by it a few days prior. Patient interrupts and states "you keep getting confused. I expressed SI during that time in 1999 and not this time." Patient denies past hospitalizations for feeling like a computer was controlling him. Patient states he has never said that and then goes on to say it has been years since he has heard a computer. He gets defensive with attempts to screen for paranoia or psychosis today.He denies any feelings of paranoia or staff conspiring against him today. He denies any SI/HI, AVH, thought insertion, withdrawal, broadcasting, receiving messages from electronic devices or any cravings/withdrawal from substance use. He denies any C/P, HA, and n/v/d. He reports last BM as yesterday. Patient states he has been eating well.   Total Time Spent in Direct Patient Care:  I personally spent 30 minutes on the unit in  direct patient care. The direct patient care time included face-to-face time with the patient, reviewing the patient's chart, communicating with other professionals,  and coordinating care. Greater than 50% of this time was spent in counseling or coordinating care with the patient regarding goals of hospitalization, psycho-education, and discharge planning needs.   Past Psychiatric History: See H&P   Past Medical History:  Past Medical History:  Diagnosis Date   Alcoholism (HCC)    Bipolar disorder (HCC)    Drug abuse (HCC)    xanax addiction 5 years ago   Heartburn    Medical non-compliance    Paranoid schizophrenia (HCC)    Schizophrenia (HCC)    Seizures (HCC)     Past Surgical History:  Procedure Laterality Date   NO PAST SURGERIES     Family History: See H&P Family History  Problem Relation Age of Onset   Thyroid disease Mother    Depression Maternal Aunt    Family Psychiatric History: see H&P   Social History:  Social History   Substance and Sexual Activity  Alcohol Use Not Currently   Comment: has been sober since June 4th     Social History   Substance and Sexual Activity  Drug Use Not Currently   Types: Marijuana, Benzodiazepines   Comment: THC: was smoking 1 ounce every day, last use: April & xanax use was 10 mg every day for "a few months"    Social History   Socioeconomic History   Marital status: Single    Spouse name: Not on file   Number of children: Not on file   Years of education: Not on file   Highest education level: Not on file  Occupational History    Comment: Unclear  Tobacco Use   Smoking status: Former    Types: Cigarettes    Quit date: 02/21/2022    Years since quitting: 0.4   Smokeless tobacco: Never   Tobacco comments:    He stopped smoking in April of 2023. He said that he used to smoke "a few packs per day."   Vaping Use   Vaping Use: Never used  Substance and Sexual Activity   Alcohol use: Not Currently    Comment: has been sober since June 4th   Drug use: Not Currently    Types: Marijuana, Benzodiazepines    Comment: THC: was smoking 1 ounce every day, last use: April & xanax use  was 10 mg every day for "a few months"   Sexual activity: Not Currently  Other Topics Concern   Not on file  Social History Narrative   Pt reported that he lives in a house apartment.   Social Determinants of Health   Financial Resource Strain: Not on file  Food Insecurity: Food Insecurity Present (07/23/2022)   Hunger Vital Sign    Worried About Running Out of Food in the Last Year: Sometimes true    Ran Out of Food in the Last Year: Sometimes true  Transportation Needs: No Transportation Needs (07/23/2022)   PRAPARE - Administrator, Civil Service (Medical): No    Lack of Transportation (Non-Medical): No  Physical Activity: Not on file  Stress: Not on file  Social Connections: Not on file    Current Medications: Current Facility-Administered Medications  Medication Dose Route Frequency Provider Last Rate Last Admin   acetaminophen (TYLENOL) tablet 650 mg  650 mg Oral Q6H PRN Thermon Leyland, NP       alum & mag hydroxide-simeth (MAALOX/MYLANTA) 200-200-20  MG/5ML suspension 30 mL  30 mL Oral Q4H PRN Fransisca Kaufmannavis, Laura A, NP       divalproex (DEPAKOTE ER) 24 hr tablet 1,000 mg  1,000 mg Oral QHS Mason JimSingleton, Lakyla Biswas E, MD   1,000 mg at 07/26/22 2114   hydrOXYzine (ATARAX) tablet 25 mg  25 mg Oral Q6H PRN Thermon Leylandavis, Laura A, NP   25 mg at 07/23/22 2352   OLANZapine zydis (ZYPREXA) disintegrating tablet 5 mg  5 mg Oral Q8H PRN Comer LocketSingleton, Kyliegh Jester E, MD       And   LORazepam (ATIVAN) tablet 1 mg  1 mg Oral PRN Comer LocketSingleton, Kaida Games E, MD       And   ziprasidone (GEODON) injection 20 mg  20 mg Intramuscular PRN Mason JimSingleton, Vanity Larsson E, MD       magnesium hydroxide (MILK OF MAGNESIA) suspension 30 mL  30 mL Oral Daily PRN Fransisca Kaufmannavis, Laura A, NP       nicotine polacrilex (NICORETTE) gum 2 mg  2 mg Oral PRN Mason JimSingleton, Lenny Fiumara E, MD       pantoprazole (PROTONIX) EC tablet 40 mg  40 mg Oral Daily Mason JimSingleton, Annalisse Minkoff E, MD   40 mg at 07/27/22 [id number]   QUEtiapine (SEROQUEL) tablet 300 mg  300 mg Oral QHS Mason JimSingleton, Jahmel Flannagan E, MD   300  mg at 07/26/22 2114   traZODone (DESYREL) tablet 50 mg  50 mg Oral QHS PRN Thermon Leylandavis, Laura A, NP   50 mg at 07/23/22 2352    Lab Results:  No results found for this or any previous visit (from the past 24 hour(s)).   Blood Alcohol level:  Lab Results  Component Value Date   ETH <10 12/16/2020   ETH <10 01/30/2020    Metabolic Disorder Labs: Lab Results  Component Value Date   HGBA1C 5.3 07/25/2022   MPG 105.41 07/25/2022   MPG 100 01/05/2017   Lab Results  Component Value Date   PROLACTIN 30.4 (H) 01/06/2017   PROLACTIN 13.1 01/05/2017   Lab Results  Component Value Date   CHOL 171 07/25/2022   TRIG 294 (H) 07/25/2022   HDL 34 (L) 07/25/2022   CHOLHDL 5.0 07/25/2022   VLDL 59 (H) 07/25/2022   LDLCALC 78 07/25/2022   LDLCALC 109 (H) 01/05/2017    Physical Findings: AIMS: 0 CIWA:  CIWA-Ar Total: 1   Musculoskeletal: Strength & Muscle Tone: within normal limits Gait & Station: normal  Psychiatric Specialty Exam:  Presentation  General Appearance: Casually dressed, adequate hygiene   Eye Contact:Fleeting but more than previous days   Speech: less verbose; normal fluency, not pressured; irritable tone at times    Speech Volume:Normal   Mood and Affect  Mood:Appears aloof, irritable    Affect:constricted, guarded, irritable, defensive  Thought Process  Thought Processes:Ruminative about medications; concrete  Orientation:Full (Time, Place and Person)   Thought Content: Denies ruminations about past events today but is ruminative about medication changes; denies SI, HI, AVH,  ideas of reference, first rank symptoms, or magical thinking; is not grossly responding to internal stimuli but appears guarded on exam and paranoid with questioning  Hallucinations:Denied  Ideas of Reference:None   Suicidal Thoughts:Denied  Homicidal Thoughts:Denied   Sensorium  Memory: Fair  Judgment:Fair   Insight:Shallow  Executive Functions   Concentration:Fair   Attention Span:Fair   Recall:Fair   Fund of Knowledge:Fair   Language:Fair    Psychomotor Activity  Psychomotor Activity:steady gait, no tremors or akathisias noted   Assets  Assets:Communication Skills; Desire for Improvement;  Housing; Physical Health   Physical Exam Vitals and nursing note reviewed.  HENT:     Head: Normocephalic.  Pulmonary:     Effort: Pulmonary effort is normal.  Neurological:     General: No focal deficit present.     Mental Status: He is alert.     Review of Systems  Cardiovascular:  Negative for chest pain.  Gastrointestinal:  Negative for constipation, diarrhea, nausea and vomiting.  Neurological:  Negative for dizziness and headaches.   Blood pressure 107/82, pulse (!) 108, temperature 98.2 F (36.8 C), temperature source Oral, resp. rate 20, height 5\' 10"  (1.778 m), weight 103 kg, SpO2 99 %. Body mass index is 32.57 kg/m.   ASSESSMENT AND PLAN Albert [name] is a 46 y.o. male  with psychiatric hx per EHR of schizophrenia vs schizoaffective d/o and self-reported h/o bipolar d/o. He is more irritable today and has paranoid behaviors on exam. We will continue to titrate up on medications as he will allow and observe for improvement.  PLAN Safety and Monitoring: voluntary admission to inpatient psychiatric unit for safety, stabilization and treatment Daily contact with patient to assess and evaluate symptoms and progress in treatment Patient's case to be discussed in multi-disciplinary team meeting Observation Level : q15 minute checks Vital signs: q12 hours Precautions: suicide, elopement, and assault   Psychiatric Problems Psychiatric Diagnoses and Treatment:              Schizoaffective d/o bipolar type (r/o schizophrenia by hx; r/o bipolar II MRE mixed with psychotic features)             -- Depakote ER 1000mg  qhs titrating up as needed for mood stabilization  r/b/se/a to VPA discussed including  risk of weight gain, liver issues, and need for CBC, LFT and VPA level monitoring Admission CBC WNL and admission AST 23 and ALT 28 - will repeat CBC, LFTs and VPA level Saturday              --   Continue Seroquel 300mg  qhs for mood stabilization and paranoia (he does not consent to further dose increases at this time) r/b/se/a to atypical antipsychotic use including risk of developing weight gain, EPS/TD, and metabolic syndrome were discussed and he consents to med trial A1c 5.3, QTC , and Cholesterol 171, LDL 78, HDL 34, Triglycerides 294, VLDL 59.  -- Encouraged patient to participate in unit milieu and in scheduled group therapies                  Alcohol use d/o in early remission Cannabis use d/o in early remission R/o anxiolytic/sedative/hypnotic use d/o  -- PDMP reviewed and he last filled Xanax 0.5mg  30 tabs in May 2023 -- Encouraged to continue abstinence from substances and to continue AA and NA              Medical Problems    Tobacco Use Disorder             -- Nicotine gum PRN             -- Smoking cessation encouraged                          HTN  -- patient denies home medications, most recent BP is 107/82- Continue to monitor    GERD  -- Protonix 40mg  daily  Hypertriglyceridemia -- Will discuss need for weight loss, increased exercise and dietary changes with patient - will need PCP  f/u after discharge for trending while on an atypical antipsychotic   Admission labs reviewed: TSH 3.607, CBC WNL, CMP WNL other than glucose 111, UDS negative, respiratory panel negative, EKG NSR with QTC , lipid HDL 34, Triglycerides 294, VLDL 59. , A1c 5.3.   PRNs Tylenol 650 mg for mild pain Maalox/Mylanta 30 mL for indigestion Hydroxyzine 25 mg tid for anxiety Milk of Magnesia 30 mL for constipation Trazodone 50 mg for sleep   4. Discharge Planning: Social work and case management to assist with discharge planning and identification of hospital follow-up needs  prior to discharge Estimated LOS: 5-7 days Discharge Concerns: Need to establish a safety plan; Medication compliance and effectiveness Discharge Goals: Return home with outpatient referrals for mental health follow-up including medication management/psychotherapy   Bedelia Person, Medical Student 07/27/2022, 10:59 AM  Attestation for Student Documentation: Attestation for Student Documentation:  I certify that I saw and interviewed the patient together with the medical student and was present for the duration of the interview.  I reviewed the medical record.  I performed or reperformed the mental status examination of the patient as indicated.  I formulated the assessment and plan of treatment as documented above with edits. Bartholomew Crews, MD, Celene Skeen

## 2022-07-27 NOTE — Group Note (Signed)
Recreation Therapy Group Note   Group Topic:Healthy Decision Making  Group Date: 07/27/2022 Start Time: 0930 End Time: 1000 Facilitators: Tobie Hellen-McCall, LRT,CTRS Location: [address] Dayroom   Goal Area(s) Addresses:  Patient will effectively work with peer towards shared goal.  Patient will identify factors that guided their decision making.  Patient will pro-socially communicate ideas during group session.   Group Description:  Patients were given a scenario that they were going to be stranded on a deserted [place] for several months before being rescued. Writer tasked them with making a list of 15 things they would choose to bring with them for "survival". The list of items was prioritized most important to least. Each patient would come up with their own list, then work together to create a new list of 15 items while in a group of 3-5 peers. LRT discussed each person's list and how it differed from others. The debrief included discussion of priorities, good decisions versus bad decisions, and how it is important to think before acting so we can make the best decision possible. LRT tied the concept of effective communication among group members to patient's support systems outside of the hospital and its benefit post discharge.   Affect/Mood: Agitated   Participation Level: None   Participation Quality: None   Behavior: Agitated   Speech/Thought Process: Distracted   Insight: None   Judgement: None   Modes of Intervention: Activity   Patient Response to Interventions:  None   Education Outcome:  Acknowledges education and In group clarification offered    Clinical Observations/Individualized Feedback:  Pt came in late upset he didn't know about group and wanting to argue.  LRT expressed with pt that they weren't going to go back and forth.  Pt then began reading a book he had and was called out of group to meet with provider and did not return.    Plan: Continue to  engage patient in RT group sessions 2-3x/week.   Kaena Santori-McCall, LRT,CTRS 07/27/2022 12:48 PM

## 2022-07-27 NOTE — Progress Notes (Signed)
   07/27/22 0200  Psych Admission Type (Psych Patients Only)  Admission Status Voluntary  Psychosocial Assessment  Patient Complaints Depression  Eye Contact Fair  Facial Expression Flat  Affect Depressed  Speech Logical/coherent  Interaction Minimal  Motor Activity Slow  Appearance/Hygiene Unremarkable  Behavior Characteristics Cooperative;Appropriate to situation  Mood Depressed  Thought Process  Coherency WDL  Content WDL  Delusions None reported or observed  Perception WDL  Hallucination None reported or observed  Judgment Poor  Confusion None  Danger to Self  Current suicidal ideation? Denies  Self-Injurious Behavior No self-injurious ideation or behavior indicators observed or expressed   Agreement Not to Harm Self Yes  Description of Agreement verbal  Danger to Others  Danger to Others None reported or observed

## 2022-07-27 NOTE — Group Note (Signed)
LCSW Group Therapy Note   Group Date: 07/27/2022 Start Time: 1300 End Time: 1400   Type of Therapy and Topic:  Group Therapy:  Stress Management   Participation Level:  Active    Description of Group:  Patients in this group were introduced to the idea of stress and encouraged to discuss negative and positive ways to manage stress. Patients discussed specific stressors that they have in their life right now and the physical signs and symptoms associated with that stress.  Patient encouraged to come up with positive changes to assist with the stress upon discharge in order to prevent future hospitalizations.   They also worked as a group on developing a specific plan for several patients to deal with stressors through boundary-setting, psychoeducation and self-care techniques   Therapeutic Goals:               1)  To discuss the positive and negative impacts of stress             2)  identify signs and symptoms of stress             3)  generate ideas for stress management             4)  offer mutual support to others regarding stress management             5)  Developing plans for ways to manage specific stressors upon discharge               Summary of Patient Progress: The Pt attended group and remained there the entire time.  The Pt accepted all worksheets and participated in the group discussion.  The Pt was appropriate with their peers and was able to share examples of stress in their own life.  The Pt was able to find new ways of managing their stress and having a different perspective about things that cause them stress.    Therapeutic Modalities:   Motivational Interviewing Brief Solution-Focused Therapy   Jaren Vanetten M Ajah Vanhoose, LCSWA 07/27/2022  2:01 PM    

## 2022-07-28 LAB — CBC WITH DIFFERENTIAL/PLATELET
Abs Immature Granulocytes: 0.03 10*3/uL (ref 0.00–0.07)
Basophils Absolute: 0.1 10*3/uL (ref 0.0–0.1)
Basophils Relative: 1 %
Eosinophils Absolute: 0.2 10*3/uL (ref 0.0–0.5)
Eosinophils Relative: 2 %
HCT: 46.3 % (ref 39.0–52.0)
Hemoglobin: 15.6 g/dL (ref 13.0–17.0)
Immature Granulocytes: 0 %
Lymphocytes Relative: 52 %
Lymphs Abs: 5.4 10*3/uL — ABNORMAL HIGH (ref 0.7–4.0)
MCH: 32 pg (ref 26.0–34.0)
MCHC: 33.7 g/dL (ref 30.0–36.0)
MCV: 95.1 fL (ref 80.0–100.0)
Monocytes Absolute: 0.8 10*3/uL (ref 0.1–1.0)
Monocytes Relative: 8 %
Neutro Abs: 3.8 10*3/uL (ref 1.7–7.7)
Neutrophils Relative %: 37 %
Platelet Morphology: NORMAL
Platelets: 333 10*3/uL (ref 150–400)
RBC: 4.87 MIL/uL (ref 4.22–5.81)
RDW: 12.8 % (ref 11.5–15.5)
WBC: 10.3 10*3/uL (ref 4.0–10.5)
nRBC: 0 % (ref 0.0–0.2)

## 2022-07-28 LAB — VALPROIC ACID LEVEL: Valproic Acid Lvl: 71 ug/mL (ref 50.0–100.0)

## 2022-07-28 LAB — HEPATIC FUNCTION PANEL
ALT: 19 U/L (ref 0–44)
AST: 18 U/L (ref 15–41)
Albumin: 3.6 g/dL (ref 3.5–5.0)
Alkaline Phosphatase: 59 U/L (ref 38–126)
Bilirubin, Direct: <0.1 mg/dL (ref 0.0–0.2)
Total Bilirubin: 0.4 mg/dL (ref 0.3–1.2)
Total Protein: 6.8 g/dL (ref 6.5–8.1)

## 2022-07-28 NOTE — Group Note (Signed)
Date:  07/28/2022 Time:  4:04 PM  Group Topic/Focus:  Anger Management  Additional Comments:  Pt was provided with anger management materials and encouraged to come to staff with any questions.   Albert [name] 07/28/2022, 4:04 PM  

## 2022-07-28 NOTE — Progress Notes (Signed)
   07/28/22 0910  Psych Admission Type (Psych Patients Only)  Admission Status Voluntary  Psychosocial Assessment  Patient Complaints None  Eye Contact Fair  Facial Expression Flat  Affect Flat  Speech Logical/coherent  Interaction Assertive  Motor Activity Other (Comment) (WNL)  Appearance/Hygiene Unremarkable  Behavior Characteristics Cooperative  Mood Euthymic  Thought Process  Coherency WDL  Content WDL  Delusions None reported or observed  Perception WDL  Hallucination None reported or observed  Judgment WDL  Confusion None  Danger to Self  Current suicidal ideation? Denies  Self-Injurious Behavior No self-injurious ideation or behavior indicators observed or expressed   Agreement Not to Harm Self Yes  Description of Agreement verbally contracts for safety  Danger to Others  Danger to Others None reported or observed

## 2022-07-28 NOTE — Group Note (Signed)
LCSW Group Therapy Note  07/28/2022    10:00-11:00am   Type of Therapy and Topic:  Group Therapy: Early Messages Received About Anger  Participation Level:  Active   Description of Group:   In this group, patients shared and discussed the early messages received in their lives about anger through parental or other adult modeling, teaching, repression, punishment, violence, and more.  Participants identified how those childhood lessons influence even now how they usually or often react when angered.  The group discussed that anger is a secondary emotion and what may be the underlying emotional themes that come out through anger outbursts or that are ignored through anger suppression.    Therapeutic Goals: Patients will identify one or more childhood message about anger that they received and how it was taught to them. Patients will discuss how these childhood experiences have influenced and continue to influence their own expression or repression of anger even today. Patients will explore possible primary emotions that tend to fuel their secondary emotion of anger. Patients will learn that anger itself is normal and cannot be eliminated, and that healthier coping skills can assist with resolving conflict rather than worsening situations.  Summary of Patient Progress:  The patient shared that they get angry at times, but have learned to journal and let emotions out in a healthy way.  The patient participated fully and demonstrated insight.  Therapeutic Modalities:   Cognitive Behavioral Therapy Motivation Interviewing  [place], [place] 07/28/2022 11:33 AM

## 2022-07-28 NOTE — Progress Notes (Signed)
[facility] MD Progress Note  07/28/2022 12:30 PM Albert [name]  MRN:  [id number]  Principal Problem: Schizoaffective disorder, bipolar type [facility]) Diagnosis: Principal Problem:   Schizoaffective disorder, bipolar type [facility])  Patient is a  46y.o. male with self-reported past psychiatric hx of bipolar depression, who presented to [facility] voluntarily with thoughts of "hopelessness, sadness, betrayal, and rejection." At his intake assessment as a walk-in to [facility], there was concern for possible suicidal ideation and the patient voluntarily was admitted to [facility] for acute stabilization.   Interval History Patient was seen today for re-evaluation.  Nursing reports no events overnight. The patient has no issues with performing ADLs.  Patient has been medication compliant.    Patient was seen and interviewed by attending psychiatrist. Chart reviewed. Patient discussed during treatment team rounds.  Subjective:  On assessment patient reports "I am feeling fine today", "I am accepting medications. I need more time to adjust to the dose. It took a month for my previous psychiatrist to increase the medication dose in the past, because my body adjusts slowly". He reports morning grogginess he believes caused by evening dose of Seroquel. He reports being in good mood today overall, states morning started well and he watched "The Princess and the frog[employer] movie he likes a lot. He does not express any mental complaints. On direct questioning, he denies feeling depressed, anxious, panicky. He reports good sleep and good appetite. He denies auditory or visual hallucinations, denies feeling paranoid, unsafe in the unit, he does not express any delusions, although he clearly remains preoccupied with medication dose and possible side effects. He denies thoughts or plans of hurting self or others. He denies any physical complaints.  Labs: CBC - mostly wnl except of lymphocytosis; LFT - wnl, VPA level - therapeutic at 71  ug/ml.   Total Time spent with patient:  I personally spent 35 minutes on the unit in direct patient care. The direct patient care time included face-to-face time with the patient, reviewing the patient's chart, communicating with other professionals, and coordinating care. Greater than 50% of this time was spent in counseling or coordinating care with the patient regarding goals of hospitalization, psycho-education, and discharge planning needs.  Past Psychiatric History: see H&P  Past Medical History:  Past Medical History:  Diagnosis Date   Alcoholism (HCC)    Bipolar disorder (HCC)    Drug abuse (HCC)    xanax addiction 5 years ago   Heartburn    Medical non-compliance    Paranoid schizophrenia (HCC)    Schizophrenia (HCC)    Seizures (HCC)     Past Surgical History:  Procedure Laterality Date   NO PAST SURGERIES     Family History:  Family History  Problem Relation Age of Onset   Thyroid disease Mother    Depression Maternal Aunt    Family Psychiatric  History: See H&P Social History:  Social History   Substance and Sexual Activity  Alcohol Use Not Currently   Comment: has been sober since June 4th     Social History   Substance and Sexual Activity  Drug Use Not Currently   Types: Marijuana, Benzodiazepines   Comment: THC: was smoking 1 ounce every day, last use: April & xanax use was 10 mg every day for "a few months"    Social History   Socioeconomic History   Marital status: Single    Spouse name: Not on file   Number of children: Not on file   Years of education: Not  on file   Highest education level: Not on file  Occupational History    Comment: Unclear  Tobacco Use   Smoking status: Former    Types: Cigarettes    Quit date: 02/21/2022    Years since quitting: 0.4   Smokeless tobacco: Never   Tobacco comments:    He stopped smoking in April of 2023. He said that he used to smoke "a few packs per day."   Vaping Use   Vaping Use: Never used   Substance and Sexual Activity   Alcohol use: Not Currently    Comment: has been sober since June 4th   Drug use: Not Currently    Types: Marijuana, Benzodiazepines    Comment: THC: was smoking 1 ounce every day, last use: April & xanax use was 10 mg every day for "a few months"   Sexual activity: Not Currently  Other Topics Concern   Not on file  Social History Narrative   Pt reported that he lives in a house apartment.   Social Determinants of Health   Financial Resource Strain: Not on file  Food Insecurity: Food Insecurity Present (07/23/2022)   Hunger Vital Sign    Worried About Running Out of Food in the Last Year: Sometimes true    Ran Out of Food in the Last Year: Sometimes true  Transportation Needs: No Transportation Needs (07/23/2022)   PRAPARE - Administrator, Civil Service (Medical): No    Lack of Transportation (Non-Medical): No  Physical Activity: Not on file  Stress: Not on file  Social Connections: Not on file   Additional Social History:                         Sleep: Good  Appetite:  Good  Current Medications: Current Facility-Administered Medications  Medication Dose Route Frequency Provider Last Rate Last Admin   acetaminophen (TYLENOL) tablet 650 mg  650 mg Oral Q6H PRN Thermon Leyland, NP       alum & mag hydroxide-simeth (MAALOX/MYLANTA) 200-200-20 MG/5ML suspension 30 mL  30 mL Oral Q4H PRN Thermon Leyland, NP       divalproex (DEPAKOTE ER) 24 hr tablet 1,000 mg  1,000 mg Oral QHS Mason Jim, Amy E, MD   1,000 mg at 07/27/22 2101   hydrOXYzine (ATARAX) tablet 25 mg  25 mg Oral Q6H PRN Thermon Leyland, NP   25 mg at 07/23/22 2352   OLANZapine zydis (ZYPREXA) disintegrating tablet 5 mg  5 mg Oral Q8H PRN Comer Locket, MD       And   LORazepam (ATIVAN) tablet 1 mg  1 mg Oral PRN Comer Locket, MD       And   ziprasidone (GEODON) injection 20 mg  20 mg Intramuscular PRN Mason Jim, Amy E, MD       magnesium hydroxide (MILK OF  MAGNESIA) suspension 30 mL  30 mL Oral Daily PRN Fransisca Kaufmann A, NP       nicotine polacrilex (NICORETTE) gum 2 mg  2 mg Oral PRN Mason Jim, Amy E, MD       pantoprazole (PROTONIX) EC tablet 40 mg  40 mg Oral Daily Mason Jim, Amy E, MD   40 mg at 07/28/22 0354   QUEtiapine (SEROQUEL) tablet 300 mg  300 mg Oral QHS Mason Jim, Amy E, MD   300 mg at 07/27/22 2101   traZODone (DESYREL) tablet 50 mg  50 mg Oral QHS PRN Thermon Leyland,  NP   50 mg at 07/23/22 2352    Lab Results:  Results for orders placed or performed during the hospital encounter of 07/23/22 (from the past 48 hour(s))  CBC with Differential/Platelet     Status: Abnormal   Collection Time: 07/28/22  6:29 AM  Result Value Ref Range   WBC 10.3 4.0 - 10.5 K/uL   RBC 4.87 4.22 - 5.81 MIL/uL   Hemoglobin 15.6 13.0 - 17.0 g/dL   HCT 15.1 76.1 - 60.7 %   MCV 95.1 80.0 - 100.0 fL   MCH 32.0 26.0 - 34.0 pg   MCHC 33.7 30.0 - 36.0 g/dL   RDW 37.1 06.2 - 69.4 %   Platelets 333 150 - 400 K/uL   nRBC 0.0 0.0 - 0.2 %   Neutrophils Relative % 37 %   Neutro Abs 3.8 1.7 - 7.7 K/uL   Lymphocytes Relative 52 %   Lymphs Abs 5.4 (H) 0.7 - 4.0 K/uL   Monocytes Relative 8 %   Monocytes Absolute 0.8 0.1 - 1.0 K/uL   Eosinophils Relative 2 %   Eosinophils Absolute 0.2 0.0 - 0.5 K/uL   Basophils Relative 1 %   Basophils Absolute 0.1 0.0 - 0.1 K/uL   Immature Granulocytes 0 %   Abs Immature Granulocytes 0.03 0.00 - 0.07 K/uL   Reactive, Benign Lymphocytes PRESENT    Platelet Morphology NORMAL     Comment: Performed at [facility], [address] [place], [place] [address]  Hepatic function panel     Status: None   Collection Time: 07/28/22  6:29 AM  Result Value Ref Range   Total Protein 6.8 6.5 - 8.1 g/dL   Albumin 3.6 3.5 - 5.0 g/dL   AST 18 15 - 41 U/L   ALT 19 0 - 44 U/L   Alkaline Phosphatase 59 38 - 126 U/L   Total Bilirubin 0.4 0.3 - 1.2 mg/dL   Bilirubin, Direct <7.0 0.0 - 0.2 mg/dL   Indirect Bilirubin NOT  CALCULATED 0.3 - 0.9 mg/dL    Comment: Performed at [facility], [address] [place], [place] [address]  Valproic acid level     Status: None   Collection Time: 07/28/22  6:29 AM  Result Value Ref Range   Valproic Acid Lvl 71 50.0 - 100.0 ug/mL    Comment: Performed at [facility], [address], [place], [place] [address]    Blood Alcohol level:  Lab Results  Component Value Date   ETH <10 12/16/2020   ETH <10 01/30/2020    Metabolic Disorder Labs: Lab Results  Component Value Date   HGBA1C 5.3 07/25/2022   MPG 105.41 07/25/2022   MPG 100 01/05/2017   Lab Results  Component Value Date   PROLACTIN 30.4 (H) 01/06/2017   PROLACTIN 13.1 01/05/2017   Lab Results  Component Value Date   CHOL 171 07/25/2022   TRIG 294 (H) 07/25/2022   HDL 34 (L) 07/25/2022   CHOLHDL 5.0 07/25/2022   VLDL 59 (H) 07/25/2022   LDLCALC 78 07/25/2022   LDLCALC 109 (H) 01/05/2017    Physical Findings: AIMS: Facial and Oral Movements Muscles of Facial Expression: None, normal Lips and Perioral Area: None, normal Jaw: None, normal Tongue: None, normal,Extremity Movements Upper (arms, wrists, hands, fingers): None, normal Lower (legs, knees, ankles, toes): None, normal, Trunk Movements Neck, shoulders, hips: None, normal, Overall Severity Severity of abnormal movements (highest score from questions above): None, normal Incapacitation due to  abnormal movements: None, normal Patient's awareness of abnormal movements (rate only patient's report): No Awareness, Dental Status Current problems with teeth and/or dentures?: No Does patient usually wear dentures?: No  CIWA:  CIWA-Ar Total: 0 COWS:     Musculoskeletal: Strength & Muscle Tone: within normal limits Gait & Station: normal Patient leans: N/A  Psychiatric Specialty Exam: Appearance:  CM, appearing stated age, appears well-nourished;  wearing appropriate to situation casual clothes, with  fair grooming and hygiene. Normal level of alertness and appropriate facial expression.  Attitude/Behavior: calm, cooperative, engaging with limited eye contact.  Motor: WNL; dyskinesias not evident. Gait appears in full range.  Speech: spontaneous, clear, coherent, normal comprehension.  Mood: euthymic, "I am fine".  Affect: restricted.  Thought process: patient appears coherent, concrete, goal-directed, ruminative about medications.  Thought content: patient denies suicidal thoughts, denies homicidal thoughts; did not express any delusions.  Thought perception: patient denies auditory and visual hallucinations. Did not appear internally stimulated. Remains ruminative about medication changes; appears guarded on exam and paranoid with questioning.  Cognition: patient is alert and oriented in self, place, date.  Insight: limited, in regards of understanding of presence, nature, cause, and significance of mental or emotional problem.  Judgement: fair, in regards of ability to make good decisions concerning the appropriate thing to do in various situations, including ability to form opinions regarding their mental health condition.   Assets: Communication Skills; Desire for Improvement; Housing; Physical Health   Sleep  Sleep:No data recorded   Physical Exam: Physical Exam ROS Blood pressure (!) 127/93, pulse (!) 120, temperature 98.5 F (36.9 C), temperature source Oral, resp. rate 20, height 5\' 10"  (1.778 m), weight 103 kg, SpO2 96 %. Body mass index is 32.57 kg/m.   Treatment Plan Summary: Daily contact with patient to assess and evaluate symptoms and progress in treatment and Medication management  Patient is a 46 year old male with the above-stated past psychiatric history who is seen in follow-up.  Chart reviewed. Patient discussed with nursing. Patient continues his ruminations and possible paranoia about medications, although compliant with current medications and  doses. He refuses any dose changes today. Will continue without change for now.  PLAN: Safety and Monitoring: continue inpatient psych admission; 15-minute checks; daily contact with patient to assess and evaluate symptoms and progress in treatment; psychoeducation.Vital signs: q12 hours. Precautions: suicide, elopement, and assault. Placed on room lock out for meals, snacks and groups.  Psychiatric Problems: Schizoaffective d/o bipolar type (r/o schizophrenia by hx; r/o bipolar II MRE mixed with psychotic features)             -- Depakote ER 1000mg  qhs titrating up as needed for mood stabilization  r/b/se/a to VPA discussed including risk of weight gain, liver issues, and need for CBC, LFT and VPA level monitoring Admission CBC WNL and admission AST 23 and ALT 28; repeat CBC today 9/30 -  mostly wnl with lymph 5.4, LFTs - wnl with AST 18, ALT 19, and VPA level 91.             --   Continue Seroquel 300mg  qhs for mood stabilization and paranoia (he does not consent to further dose increases at this time) r/b/se/a to atypical antipsychotic use including risk of developing weight gain, EPS/TD, and metabolic syndrome were discussed and he consents to med trial A1c 5.3, QTC <MEASUREM<MEASUREMEN T>, and Cholesterol 171, LDL 78, HDL 34, Triglycerides 294, VLDL 59.  -- Encouraged patient to participate in unit milieu and in scheduled group therapies  Alcohol use d/o in early remission Cannabis use d/o in early remission R/o anxiolytic/sedative/hypnotic use d/o  -- PDMP reviewed and he last filled Xanax 0.5mg  30 tabs in May 2023 -- Encouraged to continue abstinence from substances and to continue AA and NA               Medical Problems                Tobacco Use Disorder             -- Nicotine gum PRN             -- Smoking cessation encouraged                          HTN  -- patient denies home medications, most recent BP is 107/82- Continue to monitor    GERD  -- Protonix 40mg  daily    Hypertriglyceridemia -- Will discuss need for weight loss, increased exercise and dietary changes with patient - will need PCP f/u after discharge for trending while on an atypical antipsychotic   Admission labs reviewed: TSH 3.607, CBC WNL, CMP WNL other than glucose 111, UDS negative, respiratory panel negative, EKG NSR with QTC 495ms, lipid HDL 34, Triglycerides 294, VLDL 59. , A1c 5.3. Pertinent Labs: CBC - mostly wnl except of lymphocytosis; LFT - wnl, VPA level - therapeutic at 71 ug/ml.   PRN medications: acetaminophen, alum & mag hydroxide-simeth, hydrOXYzine, OLANZapine zydis **AND** LORazepam **AND** ziprasidone, magnesium hydroxide, nicotine polacrilex, traZODone    Consults: No new consults placed since yesterday    Discharge Planning: Social work and case management to assist with discharge planning and identification of hospital follow-up needs prior to discharge Estimated LOS: 5-7 days Discharge Concerns: Need to establish a safety plan; Medication compliance and effectiveness Discharge Goals: Return home with outpatient referrals for mental health follow-up including medication management/psychotherapy.   Larita Fife, MD 07/28/2022, 12:30 PM

## 2022-07-28 NOTE — [facility] Group Notes (Signed)
[facility] Group Notes:  (Nursing/MHT/Case Management/Adjunct)  Date:  07/28/2022  Time:  9:57 AM  Type of Therapy:   Goals/ Orientation   Participation Level:  Active  Participation Quality:  Appropriate  Affect:  Appropriate  Cognitive:  Appropriate  Insight:  Appropriate  Engagement in Group:  Engaged  Modes of Intervention:  Education  Summary of Progress/Problems:pt. Participated and was engaged.   Bertram Savin 07/28/2022, 9:57 AM

## 2022-07-28 NOTE — [facility] Group Notes (Signed)
[facility] Group Notes:  (Nursing/MHT/Case Management/Adjunct)  Date:  07/28/2022  Time:  10:17 PM  Type of Therapy:  Psychoeducational Skills  Participation Level:  Active  Participation Quality:  Appropriate and Sharing  Affect:  Appropriate  Cognitive:  Alert and Appropriate  Insight:  Appropriate and Good  Engagement in Group:  Engaged  Modes of Intervention:  Discussion  Summary of Progress/Problems: Pt shared with the group how his day was "pretty good". He continues to look forward to learning more appropriate coping skills.  Blenda Mounts [place] 07/28/2022, 10:17 PM

## 2022-07-28 NOTE — Plan of Care (Signed)
  Problem: Activity: Goal: Interest or engagement in activities will improve Outcome: Progressing   Problem: Coping: Goal: Ability to verbalize frustrations and anger appropriately will improve Outcome: Progressing   Problem: Coping: Goal: Ability to demonstrate self-control will improve Outcome: Progressing   Problem: Safety: Goal: Periods of time without injury will increase Outcome: Progressing   

## 2022-07-29 NOTE — Progress Notes (Signed)
Pt rates depression 4/10 and anxiety 5/10. Pt reports a good appetite, and no physical problems. Pt denies SI/HI/AVH and verbally contracts for safety. Provided support and encouragement. Pt safe on the unit. Q 15 minute safety checks continued.

## 2022-07-29 NOTE — Progress Notes (Signed)
[facility] MD Progress Note  07/29/2022 11:18 AM Albert [name]  MRN:  [id number]  Principal Problem: Schizoaffective disorder, bipolar type (HCC) Diagnosis: Principal Problem:   Schizoaffective disorder, bipolar type [facility])  Patient is a  46y.o. male with self-reported past psychiatric hx of bipolar depression, who presented to [facility] voluntarily with thoughts of "hopelessness, sadness, betrayal, and rejection." At his intake assessment as a walk-in to [facility], there was concern for possible suicidal ideation and the patient voluntarily was admitted to [facility] for acute stabilization.   Interval History Patient was seen today for re-evaluation.  Nursing reports no events overnight. The patient has no issues with performing ADLs.  Patient has been medication compliant.    Patient was seen and interviewed by attending psychiatrist. Chart reviewed. Patient discussed during treatment team rounds.  Subjective:  On assessment patient reports "I am feeling good today". He reports feeling more active, his morning grogginess, he reported yesterday, resolved. We discussed bloodwork results and meaning, patient expressed understanding. He reports being in good mood and denies feeling depressed, anxious, panicky. He reports good sleep and good appetite. He denies auditory or visual hallucinations, denies feeling paranoid, unsafe in the unit, he does not express any delusions, although he remains somewhat preoccupied with medication dose and possible side effects. He denies thoughts or plans of hurting self or others. He denies any physical complaints. Patient reports that he thinks his mood and behacvior had improved compare to the status prior to the admission. He says he "googled" symptoms of acute mania prior to admission and came to the conclusion that he was in that state taking impulsivity, irritability, lack of sleep and feeling energetic, racing thoughts. He reports that he does not experience any of mentioned  symptoms anymore, as well as symptoms of depression. He says he has an appointment with his prior outpatient psychiatrist scheduled for the end of October and that SW might need to call the office to reschedule it to the closer date. He thinks he can be ready for discharge soon.   Labs: 9/30 - CBC - mostly wnl except of lymphocytosis; LFT - wnl, VPA level - therapeutic at 71 ug/ml.   Total Time spent with patient:  I personally spent 35 minutes on the unit in direct patient care. The direct patient care time included face-to-face time with the patient, reviewing the patient's chart, communicating with other professionals, and coordinating care. Greater than 50% of this time was spent in counseling or coordinating care with the patient regarding goals of hospitalization, psycho-education, and discharge planning needs.  Past Psychiatric History: see H&P  Past Medical History:  Past Medical History:  Diagnosis Date   Alcoholism (HCC)    Bipolar disorder (HCC)    Drug abuse (HCC)    xanax addiction 5 years ago   Heartburn    Medical non-compliance    Paranoid schizophrenia (HCC)    Schizophrenia (HCC)    Seizures (HCC)     Past Surgical History:  Procedure Laterality Date   NO PAST SURGERIES     Family History:  Family History  Problem Relation Age of Onset   Thyroid disease Mother    Depression Maternal Aunt    Family Psychiatric  History: See H&P Social History:  Social History   Substance and Sexual Activity  Alcohol Use Not Currently   Comment: has been sober since June 4th     Social History   Substance and Sexual Activity  Drug Use Not Currently   Types: Marijuana, Benzodiazepines  Comment: THC: was smoking 1 ounce every day, last use: April & xanax use was 10 mg every day for "a few months"    Social History   Socioeconomic History   Marital status: Single    Spouse name: Not on file   Number of children: Not on file   Years of education: Not on file    Highest education level: Not on file  Occupational History    Comment: Unclear  Tobacco Use   Smoking status: Former    Types: Cigarettes    Quit date: 02/21/2022    Years since quitting: 0.4   Smokeless tobacco: Never   Tobacco comments:    He stopped smoking in April of 2023. He said that he used to smoke "a few packs per day."   Vaping Use   Vaping Use: Never used  Substance and Sexual Activity   Alcohol use: Not Currently    Comment: has been sober since June 4th   Drug use: Not Currently    Types: Marijuana, Benzodiazepines    Comment: THC: was smoking 1 ounce every day, last use: April & xanax use was 10 mg every day for "a few months"   Sexual activity: Not Currently  Other Topics Concern   Not on file  Social History Narrative   Pt reported that he lives in a house apartment.   Social Determinants of Health   Financial Resource Strain: Not on file  Food Insecurity: Food Insecurity Present (07/23/2022)   Hunger Vital Sign    Worried About Running Out of Food in the Last Year: Sometimes true    Ran Out of Food in the Last Year: Sometimes true  Transportation Needs: No Transportation Needs (07/23/2022)   PRAPARE - Hydrologist (Medical): No    Lack of Transportation (Non-Medical): No  Physical Activity: Not on file  Stress: Not on file  Social Connections: Not on file   Additional Social History:                         Sleep: Good  Appetite:  Good  Current Medications: Current Facility-Administered Medications  Medication Dose Route Frequency Provider Last Rate Last Admin   acetaminophen (TYLENOL) tablet 650 mg  650 mg Oral Q6H PRN Niel Hummer, NP       alum & mag hydroxide-simeth (MAALOX/MYLANTA) 200-200-20 MG/5ML suspension 30 mL  30 mL Oral Q4H PRN Elmarie Shiley A, NP       divalproex (DEPAKOTE ER) 24 hr tablet 1,000 mg  1,000 mg Oral QHS Singleton, Amy E, MD   1,000 mg at 07/28/22 2111   hydrOXYzine (ATARAX) tablet 25  mg  25 mg Oral Q6H PRN Elmarie Shiley A, NP   25 mg at 07/23/22 2352   OLANZapine zydis (ZYPREXA) disintegrating tablet 5 mg  5 mg Oral Q8H PRN Harlow Asa, MD       And   LORazepam (ATIVAN) tablet 1 mg  1 mg Oral PRN Harlow Asa, MD       And   ziprasidone (GEODON) injection 20 mg  20 mg Intramuscular PRN Nelda Marseille, Amy E, MD       magnesium hydroxide (MILK OF MAGNESIA) suspension 30 mL  30 mL Oral Daily PRN Elmarie Shiley A, NP       nicotine polacrilex (NICORETTE) gum 2 mg  2 mg Oral PRN Harlow Asa, MD       pantoprazole (PROTONIX)  EC tablet 40 mg  40 mg Oral Daily Mason Jim, Amy E, MD   40 mg at 07/29/22 0811   QUEtiapine (SEROQUEL) tablet 300 mg  300 mg Oral QHS Bartholomew Crews E, MD   300 mg at 07/28/22 2111   traZODone (DESYREL) tablet 50 mg  50 mg Oral QHS PRN Thermon Leyland, NP   50 mg at 07/23/22 2352    Lab Results:  Results for orders placed or performed during the hospital encounter of 07/23/22 (from the past 48 hour(s))  CBC with Differential/Platelet     Status: Abnormal   Collection Time: 07/28/22  6:29 AM  Result Value Ref Range   WBC 10.3 4.0 - 10.5 K/uL   RBC 4.87 4.22 - 5.81 MIL/uL   Hemoglobin 15.6 13.0 - 17.0 g/dL   HCT 09.4 70.9 - 62.8 %   MCV 95.1 80.0 - 100.0 fL   MCH 32.0 26.0 - 34.0 pg   MCHC 33.7 30.0 - 36.0 g/dL   RDW 36.6 29.4 - 76.5 %   Platelets 333 150 - 400 K/uL   nRBC 0.0 0.0 - 0.2 %   Neutrophils Relative % 37 %   Neutro Abs 3.8 1.7 - 7.7 K/uL   Lymphocytes Relative 52 %   Lymphs Abs 5.4 (H) 0.7 - 4.0 K/uL   Monocytes Relative 8 %   Monocytes Absolute 0.8 0.1 - 1.0 K/uL   Eosinophils Relative 2 %   Eosinophils Absolute 0.2 0.0 - 0.5 K/uL   Basophils Relative 1 %   Basophils Absolute 0.1 0.0 - 0.1 K/uL   Immature Granulocytes 0 %   Abs Immature Granulocytes 0.03 0.00 - 0.07 K/uL   Reactive, Benign Lymphocytes PRESENT    Platelet Morphology NORMAL     Comment: Performed at [facility], [address],  [place], [place] [address]  Hepatic function panel     Status: None   Collection Time: 07/28/22  6:29 AM  Result Value Ref Range   Total Protein 6.8 6.5 - 8.1 g/dL   Albumin 3.6 3.5 - 5.0 g/dL   AST 18 15 - 41 U/L   ALT 19 0 - 44 U/L   Alkaline Phosphatase 59 38 - 126 U/L   Total Bilirubin 0.4 0.3 - 1.2 mg/dL   Bilirubin, Direct <5.4 0.0 - 0.2 mg/dL   Indirect Bilirubin NOT CALCULATED 0.3 - 0.9 mg/dL    Comment: Performed at [facility], [address] [place], [place] [address]  Valproic acid level     Status: None   Collection Time: 07/28/22  6:29 AM  Result Value Ref Range   Valproic Acid Lvl 71 50.0 - 100.0 ug/mL    Comment: Performed at [facility], [address] [place], [place] [address]    Blood Alcohol level:  Lab Results  Component Value Date   ETH <10 12/16/2020   ETH <10 01/30/2020    Metabolic Disorder Labs: Lab Results  Component Value Date   HGBA1C 5.3 07/25/2022   MPG 105.41 07/25/2022   MPG 100 01/05/2017   Lab Results  Component Value Date   PROLACTIN 30.4 (H) 01/06/2017   PROLACTIN 13.1 01/05/2017   Lab Results  Component Value Date   CHOL 171 07/25/2022   TRIG 294 (H) 07/25/2022   HDL 34 (L) 07/25/2022   CHOLHDL 5.0 07/25/2022   VLDL 59 (H) 07/25/2022   LDLCALC 78 07/25/2022   LDLCALC 109 (H) 01/05/2017    Physical Findings: AIMS: Facial  and Oral Movements Muscles of Facial Expression: None, normal Lips and Perioral Area: None, normal Jaw: None, normal Tongue: None, normal,Extremity Movements Upper (arms, wrists, hands, fingers): None, normal Lower (legs, knees, ankles, toes): None, normal, Trunk Movements Neck, shoulders, hips: None, normal, Overall Severity Severity of abnormal movements (highest score from questions above): None, normal Incapacitation due to abnormal movements: None, normal Patient's awareness of abnormal movements (rate only patient's report): No Awareness, Dental Status Current  problems with teeth and/or dentures?: No Does patient usually wear dentures?: No  CIWA:  CIWA-Ar Total: 0 COWS:     Musculoskeletal: Strength & Muscle Tone: within normal limits Gait & Station: normal Patient leans: N/A  Psychiatric Specialty Exam: Appearance:  CM, appearing stated age, appears well-nourished;  wearing appropriate to situation casual clothes, with fair grooming and hygiene. Normal level of alertness and appropriate facial expression.  Attitude/Behavior: calm, cooperative, engaging with limited eye contact.  Motor: WNL; dyskinesias not evident. Gait appears in full range.  Speech: spontaneous, clear, coherent, normal comprehension.  Mood: euthymic, "I am fine".  Affect: restricted.  Thought process: patient appears coherent, concrete, goal-directed, ruminative about medications.  Thought content: patient denies suicidal thoughts, denies homicidal thoughts; did not express any delusions.  Thought perception: patient denies auditory and visual hallucinations. Did not appear internally stimulated. Remains ruminative about medication changes; appears guarded on exam and paranoid with questioning.  Cognition: patient is alert and oriented in self, place, date.  Insight: limited, in regards of understanding of presence, nature, cause, and significance of mental or emotional problem.  Judgement: fair, in regards of ability to make good decisions concerning the appropriate thing to do in various situations, including ability to form opinions regarding their mental health condition.   Assets: Manufacturing systems engineerCommunication Skills; Desire for Improvement; Housing; Physical Health   Sleep  Sleep:No data recorded   Physical Exam: Physical Exam ROS Blood pressure (!) 122/92, pulse (!) 127, temperature 98.5 F (36.9 C), temperature source Oral, resp. rate 20, height 5\' 10"  (1.778 m), weight 103 kg, SpO2 96 %. Body mass index is 32.57 kg/m.   Treatment Plan Summary: Daily contact  with patient to assess and evaluate symptoms and progress in treatment and Medication management  Patient is a 46 year old male with the above-stated past psychiatric history who is seen in follow-up.  Chart reviewed. Patient discussed with nursing. Patient reports stable mood, denies feeling depressed, does not express symptoms of mania. He continues some ruminations about medications, although it could be his mental baseline. He is compliant with current medications and doses. No medication changes made today.  PLAN: Safety and Monitoring: continue inpatient psych admission; 15-minute checks; daily contact with patient to assess and evaluate symptoms and progress in treatment; psychoeducation.Vital signs: q12 hours. Precautions: suicide, elopement, and assault. Placed on room lock out for meals, snacks and groups.  Psychiatric Problems: Schizoaffective d/o bipolar type (r/o schizophrenia by hx; r/o bipolar II MRE mixed with psychotic features)             -- Depakote ER 1000mg  qhs titrating up as needed for mood stabilization  r/b/se/a to VPA discussed including risk of weight gain, liver issues, and need for CBC, LFT and VPA level monitoring Admission CBC WNL and admission AST 23 and ALT 28; repeat CBC today 9/30 -  mostly wnl with lymph 5.4, LFTs - wnl with AST 18, ALT 19, and VPA level 91.             --   Continue Seroquel 300mg  qhs  for mood stabilization and paranoia (he does not consent to further dose increases at this time) r/b/se/a to atypical antipsychotic use including risk of developing weight gain, EPS/TD, and metabolic syndrome were discussed and he consents to med trial A1c 5.3, QTC , and Cholesterol 171, LDL 78, HDL 34, Triglycerides 294, VLDL 59.  -- Encouraged patient to participate in unit milieu and in scheduled group therapies                  Alcohol use d/o in early remission Cannabis use d/o in early remission R/o anxiolytic/sedative/hypnotic use d/o  -- PDMP  reviewed and he last filled Xanax 0.5mg  30 tabs in May 2023 -- Encouraged to continue abstinence from substances and to continue AA and NA               Medical Problems                Tobacco Use Disorder             -- Nicotine gum PRN             -- Smoking cessation encouraged                          HTN  -- patient denies home medications, most recent BP is 107/82- Continue to monitor    GERD  -- Protonix 40mg  daily   Hypertriglyceridemia -- Will discuss need for weight loss, increased exercise and dietary changes with patient - will need PCP f/u after discharge for trending while on an atypical antipsychotic   Admission labs reviewed: TSH 3.607, CBC WNL, CMP WNL other than glucose 111, UDS negative, respiratory panel negative, EKG NSR with QTC , lipid HDL 34, Triglycerides 294, VLDL 59. , A1c 5.3. Pertinent Labs: CBC - mostly wnl except of lymphocytosis; LFT - wnl, VPA level - therapeutic at 71 ug/ml.   PRN medications: acetaminophen, alum & mag hydroxide-simeth, hydrOXYzine, OLANZapine zydis **AND** LORazepam **AND** ziprasidone, magnesium hydroxide, nicotine polacrilex, traZODone    Consults: No new consults placed since yesterday    Discharge Planning: Social work and case management to assist with discharge planning and identification of hospital follow-up needs prior to discharge Estimated LOS: 3-5 days Discharge Concerns: Need to establish a safety plan; Medication compliance and effectiveness Discharge Goals: Return home with outpatient referrals for mental health follow-up including medication management/psychotherapy.   , MD 07/29/2022, 11:18 AM

## 2022-07-29 NOTE — Progress Notes (Signed)
D. Pt presents with a flat affect, calm, cooperative, but guarded behavior with poor eye contact. Pt very minimal upon initial approach. Per pt's self inventory, pt rated his depression,hopelessness and anxiety a 2/4/3, respectively. Pt wrote that his goal was "to learn a little bit more on alcoholism and the thought that precedes the first drink.". Pt currently denies SI/HI and AVH and does not appear to be responding to internal stimuli. A. Labs and vitals monitored. Pt given and educated on medications. Pt supported emotionally and encouraged to express concerns and ask questions.   R. Pt remains safe with 15 minute checks. Will continue POC.

## 2022-07-29 NOTE — Group Note (Signed)
[facility] LCSW Group Therapy Note   Group Date: 07/29/2022 Start Time: 1300 End Time: 1400   Type of Therapy and Topic: Group Therapy: Avoiding Self-Sabotaging and Enabling Behaviors  Participation Level: Minimal  Description of Group:  In this group, patients will learn how to identify obstacles, self-sabotaging and enabling behaviors, as well as: what are they, why do we do them and what needs these behaviors meet. Discuss unhealthy relationships and how to have positive healthy boundaries with those that sabotage and enable. Explore aspects of self-sabotage and enabling in yourself and how to limit these self-destructive behaviors in everyday life.   Therapeutic Goals: 1. Patient will identify one obstacle that relates to self-sabotage and enabling behaviors 2. Patient will identify one personal self-sabotaging or enabling behavior they did prior to admission 3. Patient will state a plan to change the above identified behavior 4. Patient will demonstrate ability to communicate their needs through discussion and/or role play.    Summary of Patient Progress: Patient was present for the entirety of group session. Patient participated in opening and closing remarks. However, patient did not contribute at all to the topic of discussion despite encouraged participation.    Therapeutic Modalities:  Cognitive Behavioral Therapy Person-Centered Therapy Motivational Interviewing    Tayra Dawe W Bernarr Longsworth, LCSWA 

## 2022-07-29 NOTE — Progress Notes (Signed)
[facility] Group Notes:  (Nursing/MHT/Case Management/Adjunct)  Date:  07/29/2022  Time:  2000  Type of Therapy:   wrap up group  Participation Level:  Active  Participation Quality:  Appropriate, Attentive, Sharing, and Supportive  Affect:  Flat  Cognitive:  Alert  Insight:  Improving  Engagement in Group:  Engaged  Modes of Intervention:  Clarification, Education, and Support  Summary of Progress/Problems: Positive thinking and positive change were discussed.   Albert [name] S 07/29/2022, 9:04 PM

## 2022-07-29 NOTE — [facility] Group Notes (Signed)
Patient attended group therapy activity called "Now Future Wall". Activity included things that are currently going on in the patient's life, what they want their futures to look like, and what walls or barriers are getting in the way. On the back side, patient listed coping skills to help them when they are faced with the "walls". Patient identified "to recover" as one of their future goals.

## 2022-07-30 ENCOUNTER — Encounter (HOSPITAL_COMMUNITY): Payer: Self-pay

## 2022-07-30 DIAGNOSIS — F250 Schizoaffective disorder, bipolar type: Principal | ICD-10-CM

## 2022-07-30 NOTE — Progress Notes (Signed)
D) Pt received calm, visible, participating in milieu, and in no acute distress. Pt A & O x4. Pt denies SI, HI, A/ V H, depression, anxiety and pain at this time. A) Pt encouraged to drink fluids. Pt encouraged to come to staff with needs. Pt encouraged to attend and participate in groups. Pt encouraged to set reachable goals.  R) Pt remained safe on unit, in no acute distress, will continue to assess.      07/30/22 2300  Psych Admission Type (Psych Patients Only)  Admission Status Voluntary  Psychosocial Assessment  Patient Complaints Anxiety  Eye Contact Brief  Facial Expression Flat  Affect Flat  Speech Logical/coherent  Interaction Minimal  Motor Activity Other (Comment) (unremarkable)  Appearance/Hygiene Unremarkable  Behavior Characteristics Anxious  Mood Anxious  Thought Process  Coherency WDL  Content WDL  Delusions None reported or observed  Perception WDL  Hallucination None reported or observed  Judgment WDL  Confusion None  Danger to Self  Current suicidal ideation? Denies  Self-Injurious Behavior No self-injurious ideation or behavior indicators observed or expressed   Agreement Not to Harm Self Yes  Description of Agreement verbal  Danger to Others  Danger to Others None reported or observed

## 2022-07-30 NOTE — Group Note (Signed)
Occupational Therapy Group Note  Group Topic:Coping Skills  Group Date: 07/30/2022 Start Time: 1400 End Time: 1440 Facilitators: Brantley Stage, OT   Group Description: Group encouraged increased engagement and participation through discussion and activity focused on "Coping Ahead." Patients were split up into teams and selected a card from a stack of positive coping strategies. Patients were instructed to act out/charade the coping skill for other peers to guess and receive points for their team. Discussion followed with a focus on identifying additional positive coping strategies and patients shared how they were going to cope ahead over the weekend while continuing hospitalization stay.  Therapeutic Goal(s): Identify positive vs negative coping strategies. Identify coping skills to be used during hospitalization vs coping skills outside of hospital/at home Increase participation in therapeutic group environment and promote engagement in treatment   Participation Level: Non-verbal   Participation Quality: Independent   Behavior: Calm   Speech/Thought Process: Did not share   Affect/Mood: Appropriate   Insight: Unable to assess   Judgement: Unable to assess   Individualization: pt was not active in the group discussion/activity. No new skills were identified as pt exited the group after the first few moments of the session / pt did so respectfully and took the session outline and notes w/ him back to his room.    Modes of Intervention: Discussion and Education  Patient Response to Interventions:   Disengaged  Plan: Continue to engage patient in OT groups 2 - 3x/week.  07/30/2022  Brantley Stage, OT Cornell Barman, OT

## 2022-07-30 NOTE — Plan of Care (Signed)
Nursing discussed coping skills with patient.

## 2022-07-30 NOTE — Progress Notes (Signed)
D:  Patient's self inventory sheet, patient sleeps good, no sleep medication.  Good appetite, normal energy level, good concentration.  Rated anxiety, depression and  hopeless #3.  Denied withdrawals.  Denied SI.  Denied physical problems.  Denied physical pain.  Plans to talk to MD about discharge.  Does have discharge plans A:  Medications administered per MD orders  Emotional support and  encouragement given patient.   R:  Safety maintained with 15 minute checks.  Patient denied SI and HI, contracts for safety.  Denied A/V hallucinations.

## 2022-07-30 NOTE — Group Note (Signed)
Recreation Therapy Group Note   Group Topic:Stress Management  Group Date: 07/30/2022 Start Time: 0935 End Time: 0955 Facilitators: Asuna Peth-McCall, LRT,CTRS Location: [address] Dayroom    Goal Area(s) Addresses:  Patient will identify positive stress management techniques. Patient will identify benefits of using stress management post d/c.  Group Description:  Meditation.  LRT played a meditation for patients from the Calm app that focused on strengthening resilience during tough situations to create peace moving forward.  Patients were to listen and follow along as meditation played to fully engage in the concept being offered.  LRT and patients discussed other areas to engage in stress management such as Youtube, scripts, apps and the Internet.    Affect/Mood: Appropriate   Participation Level: Engaged   Participation Quality: Independent   Behavior: Appropriate   Speech/Thought Process: Focused   Insight: Good   Judgement: Good   Modes of Intervention: App   Patient Response to Interventions:  Engaged   Education Outcome:  Acknowledges education and In group clarification offered    Clinical Observations/Individualized Feedback: Pt attended and participated in group session.     Plan: Continue to engage patient in RT group sessions 2-3x/week.   Albert [name], LRT,CTRS  07/30/2022 11:54 AM

## 2022-07-30 NOTE — Group Note (Signed)
LCSW Group Therapy Note  Group Date: 07/30/2022 Start Time: 1300 End Time: 1345   Type of Therapy and Topic:  Group Therapy - Healthy vs Unhealthy Coping Skills  Participation Level:  Active   Description of Group The focus of this group was to determine what unhealthy coping techniques typically are used by group members and what healthy coping techniques would be helpful in coping with various problems. Patients were guided in becoming aware of the differences between healthy and unhealthy coping techniques. Patients were asked to identify 2-3 healthy coping skills they would like to learn to use more effectively.  Therapeutic Goals Patients learned that coping is what human beings do all day long to deal with various situations in their lives Patients defined and discussed healthy vs unhealthy coping techniques Patients identified their preferred coping techniques and identified whether these were healthy or unhealthy Patients determined 2-3 healthy coping skills they would like to become more familiar with and use more often. Patients provided support and ideas to each other   Summary of Patient Progress:  During group, Dustine Patient proved open to input from peers and feedback from [place]. Patient demonstrated appropriate insight into the subject matter, was respectful of peers, and participated throughout the entire session.   Therapeutic Modalities Cognitive Behavioral Therapy Motivational Interviewing  Windle Guard, LCSW 07/30/2022  2:18 PM

## 2022-07-30 NOTE — BH IP Treatment Plan (Signed)
Interdisciplinary Treatment and Diagnostic Plan Update  07/30/2022 Time of Session: 0830 Albert [name] MRN: [id number]  Principal Diagnosis: Schizoaffective disorder, bipolar type [facility])  Secondary Diagnoses: Principal Problem:   Schizoaffective disorder, bipolar type ([place])   Current Medications:  Current Facility-Administered Medications  Medication Dose Route Frequency Provider Last Rate Last Admin   acetaminophen (TYLENOL) tablet 650 mg  650 mg Oral Q6H PRN Niel Hummer, NP       alum & mag hydroxide-simeth (MAALOX/MYLANTA) 200-200-20 MG/5ML suspension 30 mL  30 mL Oral Q4H PRN Niel Hummer, NP       divalproex (DEPAKOTE ER) 24 hr tablet 1,000 mg  1,000 mg Oral QHS Nelda Marseille, Amy E, MD   1,000 mg at 07/29/22 2110   hydrOXYzine (ATARAX) tablet 25 mg  25 mg Oral Q6H PRN Niel Hummer, NP   25 mg at 07/23/22 2352   OLANZapine zydis (ZYPREXA) disintegrating tablet 5 mg  5 mg Oral Q8H PRN Harlow Asa, MD       And   LORazepam (ATIVAN) tablet 1 mg  1 mg Oral PRN Harlow Asa, MD       And   ziprasidone (GEODON) injection 20 mg  20 mg Intramuscular PRN Nelda Marseille, Amy E, MD       magnesium hydroxide (MILK OF MAGNESIA) suspension 30 mL  30 mL Oral Daily PRN Elmarie Shiley A, NP       nicotine polacrilex (NICORETTE) gum 2 mg  2 mg Oral PRN Nelda Marseille, Amy E, MD       pantoprazole (PROTONIX) EC tablet 40 mg  40 mg Oral Daily Nelda Marseille, Amy E, MD   40 mg at 07/30/22 0817   QUEtiapine (SEROQUEL) tablet 300 mg  300 mg Oral QHS Nelda Marseille, Amy E, MD   300 mg at 07/29/22 2110   traZODone (DESYREL) tablet 50 mg  50 mg Oral QHS PRN Niel Hummer, NP   50 mg at 07/23/22 2352   PTA Medications: Medications Prior to Admission  Medication Sig Dispense Refill Last Dose   ibuprofen (ADVIL) 200 MG tablet Take 800 mg by mouth every 6 (six) hours as needed for mild pain.   Past Week   omeprazole (PRILOSEC OTC) 20 MG tablet Take 20 mg by mouth daily as needed (For heartburn or acid reflux).    07/22/2022    Patient Stressors: Medication change or noncompliance   Other: "rejection from loves ones"    Patient Strengths: Capable of independent living  Communication skills  General fund of knowledge  Motivation for treatment/growth  Physical Health   Treatment Modalities: Medication Management, Group therapy, Case management,  1 to 1 session with clinician, Psychoeducation, Recreational therapy.   Physician Treatment Plan for Primary Diagnosis: Schizoaffective disorder, bipolar type ([place]) Long Term Goal(s): Improvement in symptoms so as ready for discharge   Short Term Goals: Ability to verbalize feelings will improve Ability to disclose and discuss suicidal ideas Ability to demonstrate self-control will improve Ability to identify and develop effective coping behaviors will improve Ability to maintain clinical measurements within normal limits will improve  Medication Management: Evaluate patient's response, side effects, and tolerance of medication regimen.  Therapeutic Interventions: 1 to 1 sessions, Unit Group sessions and Medication administration.  Evaluation of Outcomes: Progressing  Physician Treatment Plan for Secondary Diagnosis: Principal Problem:   Schizoaffective disorder, bipolar type ([place])  Long Term Goal(s): Improvement in symptoms so as ready for discharge   Short Term Goals: Ability to verbalize feelings will improve Ability  to disclose and discuss suicidal ideas Ability to demonstrate self-control will improve Ability to identify and develop effective coping behaviors will improve Ability to maintain clinical measurements within normal limits will improve     Medication Management: Evaluate patient's response, side effects, and tolerance of medication regimen.  Therapeutic Interventions: 1 to 1 sessions, Unit Group sessions and Medication administration.  Evaluation of Outcomes: Progressing   RN Treatment Plan for Primary Diagnosis:  Schizoaffective disorder, bipolar type (HCC) Long Term Goal(s): Knowledge of disease and therapeutic regimen to maintain health will improve  Short Term Goals: Ability to remain free from injury will improve, Ability to verbalize frustration and anger appropriately will improve, Ability to demonstrate self-control, Ability to participate in decision making will improve, Ability to verbalize feelings will improve, Ability to disclose and discuss suicidal ideas, Ability to identify and develop effective coping behaviors will improve, and Compliance with prescribed medications will improve  Medication Management: RN will administer medications as ordered by provider, will assess and evaluate patient's response and provide education to patient for prescribed medication. RN will report any adverse and/or side effects to prescribing provider.  Therapeutic Interventions: 1 on 1 counseling sessions, Psychoeducation, Medication administration, Evaluate responses to treatment, Monitor vital signs and CBGs as ordered, Perform/monitor CIWA, COWS, AIMS and Fall Risk screenings as ordered, Perform wound care treatments as ordered.  Evaluation of Outcomes: Progressing   LCSW Treatment Plan for Primary Diagnosis: Schizoaffective disorder, bipolar type (HCC) Long Term Goal(s): Safe transition to appropriate next level of care at discharge, Engage patient in therapeutic group addressing interpersonal concerns.  Short Term Goals: Engage patient in aftercare planning with referrals and resources, Increase social support, Increase ability to appropriately verbalize feelings, Increase emotional regulation, Facilitate acceptance of mental health diagnosis and concerns, Facilitate patient progression through stages of change regarding substance use diagnoses and concerns, Identify triggers associated with mental health/substance abuse issues, and Increase skills for wellness and recovery  Therapeutic Interventions: Assess  for all discharge needs, 1 to 1 time with Social worker, Explore available resources and support systems, Assess for adequacy in community support network, Educate family and significant other(s) on suicide prevention, Complete Psychosocial Assessment, Interpersonal group therapy.  Evaluation of Outcomes: Progressing   Progress in Treatment: Attending groups: Yes. Participating in groups: No. Taking medication as prescribed: Yes. Toleration medication: Yes. Family/Significant other contact made: Yes, individual(s) contacted:  Jeanie Sewer [phone] (Mother) Patient understands diagnosis: Yes. Discussing patient identified problems/goals with staff: Yes. Medical problems stabilized or resolved: Yes. Denies suicidal/homicidal ideation: No. Issues/concerns per patient self-inventory: Yes. Other: none  New problem(s) identified: No, Describe:  none  New Short Term/Long Term Goal(s): Patient to work towards medication management for mood stabilization; elimination of SI thoughts; development of comprehensive mental wellness plan.  Patient Goals: No additional goals identified at this time. Patient to continue to work towards original goals identified in initial treatment team meeting. CSW will remain available to patient should they voice additional treatment goals.   Discharge Plan or Barriers: No psychosocial barriers identified at this time, patient to return to place of residence when appropriate for discharge.   Reason for Continuation of Hospitalization: Depression  Estimated Length of Stay: 1-7 days    Scribe for Treatment Team: Almedia Balls 07/30/2022 10:23 AM

## 2022-07-30 NOTE — Progress Notes (Signed)
[facility] MD Progress Note  07/30/2022 8:41 AM Albert [name]  MRN:  [id number]  Principal Problem: Schizoaffective disorder, bipolar type ([place]) Diagnosis: Principal Problem:   Schizoaffective disorder, bipolar type [facility])  Patient is a  46y.o. male with self-reported past psychiatric hx of bipolar depression, who presented to [facility] voluntarily with thoughts of "hopelessness, sadness, betrayal, and rejection." At his intake assessment as a walk-in to [facility], there was concern for possible suicidal ideation and the patient voluntarily was admitted to [facility] for acute stabilization.   Interval History Patient was seen today for re-evaluation.  Nursing reports no events overnight. The patient has no issues with performing ADLs.  Patient has been medication compliant.    Patient was seen and interviewed by attending psychiatrist. Chart reviewed. Patient discussed during treatment team rounds.  Subjective:  On assessment patient reports "pretty good; I am enjoying the TV and music, as well as the stress reliever session in group". He reports sleeping well last night in comparison to previous nights.  As well, he denies having "extreme racing thoughts."  He says that his medications have helped to slow down his thought process, and denies adverse effects of medications.  He reports depression 3/10, and anxiety 4/10, which is pretty consistent with previous day. He reports good appetite. He denies auditory or visual hallucinations, denies feeling paranoid, unsafe in the unit, he does not express any delusions.  He is no longer preoccupied with medications; he informs this writer of dosages once, and does not mention them again.  He denies suicidal and homicidal ideation. He denies any physical complaints.He reiterates that he has an appointment with his prior outpatient psychiatrist scheduled for the end of October and that SW might need to call the office to reschedule it to the closer date; as well he needs to have  his therapy appointment rescheduled.  As well, patient informed this Probation officer of all of the social supports he has in place outside of the hospital, including his family, church support, and AA/NA.  With their support, he has remained his sobriety from alcohol, drugs, and nicotine for the past 4 months.  He is hopeful for discharge in the near future.  Labs: 9/30 - CBC - mostly wnl except of lymphocytosis; LFT - wnl, VPA level - therapeutic at 71 ug/ml.   Past Psychiatric History: see H&P  Past Medical History:  Past Medical History:  Diagnosis Date   Alcoholism ([place])    Bipolar disorder ([place])    Drug abuse ([place])    xanax addiction 5 years ago   Heartburn    Medical non-compliance    Paranoid schizophrenia ([place])    Schizophrenia ([place])    Seizures ([place])     Past Surgical History:  Procedure Laterality Date   NO PAST SURGERIES     Family History:  Family History  Problem Relation Age of Onset   Thyroid disease Mother    Depression Maternal Aunt    Family Psychiatric  History: See H&P Social History:  Social History   Substance and Sexual Activity  Alcohol Use Not Currently   Comment: has been sober since June 4th     Social History   Substance and Sexual Activity  Drug Use Not Currently   Types: Marijuana, Benzodiazepines   Comment: THC: was smoking 1 ounce every day, last use: April & xanax use was 10 mg every day for "a few months"    Social History   Socioeconomic History   Marital status: Single  Spouse name: Not on file   Number of children: Not on file   Years of education: Not on file   Highest education level: Not on file  Occupational History    Comment: Unclear  Tobacco Use   Smoking status: Former    Types: Cigarettes    Quit date: 02/21/2022    Years since quitting: 0.4   Smokeless tobacco: Never   Tobacco comments:    He stopped smoking in April of 2023. He said that he used to smoke "a few packs per day."   Vaping Use   Vaping Use: Never used   Substance and Sexual Activity   Alcohol use: Not Currently    Comment: has been sober since June 4th   Drug use: Not Currently    Types: Marijuana, Benzodiazepines    Comment: THC: was smoking 1 ounce every day, last use: April & xanax use was 10 mg every day for "a few months"   Sexual activity: Not Currently  Other Topics Concern   Not on file  Social History Narrative   Pt reported that he lives in a house apartment.   Social Determinants of Health   Financial Resource Strain: Not on file  Food Insecurity: Food Insecurity Present (07/23/2022)   Hunger Vital Sign    Worried About Running Out of Food in the Last Year: Sometimes true    Ran Out of Food in the Last Year: Sometimes true  Transportation Needs: No Transportation Needs (07/23/2022)   PRAPARE - Hydrologist (Medical): No    Lack of Transportation (Non-Medical): No  Physical Activity: Not on file  Stress: Not on file  Social Connections: Not on file   Additional Social History:                         Sleep: Good  Appetite:  Good  Current Medications: Current Facility-Administered Medications  Medication Dose Route Frequency Provider Last Rate Last Admin   acetaminophen (TYLENOL) tablet 650 mg  650 mg Oral Q6H PRN Niel Hummer, NP       alum & mag hydroxide-simeth (MAALOX/MYLANTA) 200-200-20 MG/5ML suspension 30 mL  30 mL Oral Q4H PRN Niel Hummer, NP       divalproex (DEPAKOTE ER) 24 hr tablet 1,000 mg  1,000 mg Oral QHS Nelda Marseille, Amy E, MD   1,000 mg at 07/29/22 2110   hydrOXYzine (ATARAX) tablet 25 mg  25 mg Oral Q6H PRN Niel Hummer, NP   25 mg at 07/23/22 2352   OLANZapine zydis (ZYPREXA) disintegrating tablet 5 mg  5 mg Oral Q8H PRN Harlow Asa, MD       And   LORazepam (ATIVAN) tablet 1 mg  1 mg Oral PRN Harlow Asa, MD       And   ziprasidone (GEODON) injection 20 mg  20 mg Intramuscular PRN Nelda Marseille, Amy E, MD       magnesium hydroxide (MILK OF  MAGNESIA) suspension 30 mL  30 mL Oral Daily PRN Elmarie Shiley A, NP       nicotine polacrilex (NICORETTE) gum 2 mg  2 mg Oral PRN Nelda Marseille, Amy E, MD       pantoprazole (PROTONIX) EC tablet 40 mg  40 mg Oral Daily Nelda Marseille, Amy E, MD   40 mg at 07/30/22 0817   QUEtiapine (SEROQUEL) tablet 300 mg  300 mg Oral QHS Harlow Asa, MD   300 mg  at 07/29/22 2110   traZODone (DESYREL) tablet 50 mg  50 mg Oral QHS PRN Niel Hummer, NP   50 mg at 07/23/22 2352    Lab Results:  No results found for this or any previous visit (from the past 85 hour(s)).   Blood Alcohol level:  Lab Results  Component Value Date   ETH <10 12/16/2020   ETH <10 [id number]    Metabolic Disorder Labs: Lab Results  Component Value Date   HGBA1C 5.3 07/25/2022   MPG 105.41 07/25/2022   MPG 100 01/05/2017   Lab Results  Component Value Date   PROLACTIN 30.4 (H) 01/06/2017   PROLACTIN 13.1 01/05/2017   Lab Results  Component Value Date   CHOL 171 07/25/2022   TRIG 294 (H) 07/25/2022   HDL 34 (L) 07/25/2022   CHOLHDL 5.0 07/25/2022   VLDL 59 (H) 07/25/2022   LDLCALC 78 07/25/2022   LDLCALC 109 (H) 01/05/2017    Physical Findings: AIMS: Facial and Oral Movements Muscles of Facial Expression: None, normal Lips and Perioral Area: None, normal Jaw: None, normal Tongue: None, normal,Extremity Movements Upper (arms, wrists, hands, fingers): None, normal Lower (legs, knees, ankles, toes): None, normal, Trunk Movements Neck, shoulders, hips: None, normal, Overall Severity Severity of abnormal movements (highest score from questions above): None, normal Incapacitation due to abnormal movements: None, normal Patient's awareness of abnormal movements (rate only patient's report): No Awareness, Dental Status Current problems with teeth and/or dentures?: No Does patient usually wear dentures?: No  CIWA:  CIWA-Ar Total: 0   Musculoskeletal: Strength & Muscle Tone: within normal limits Gait & Station:  normal Patient leans: N/A  Psychiatric Specialty Exam: Appearance:  CM, appearing stated age, appears well-nourished;  wearing appropriate to situation casual clothes, with fair grooming and hygiene. Normal level of alertness and appropriate facial expression.  Attitude/Behavior: calm, cooperative, engaging, but with limited eye contact.  Motor: WNL; dyskinesias not evident.  Speech: spontaneous, clear, coherent, normal comprehension.  Mood: euthymic, "I am pretty good".  Affect: restricted.  Thought process: patient appears coherent, concrete, goal-directed  Thought content: patient denies suicidal thoughts, denies homicidal thoughts; did not express any delusions.  Thought perception: patient denies auditory and visual hallucinations. Did not appear internally stimulated. Remains ruminative about medication changes; appears guarded on exam and paranoid with questioning.  Cognition: patient is alert and oriented in self, place, date.  Insight: Fair, appears to have improved understanding of presence, nature, cause, and significance of mental or emotional problem.  Judgement: fair, in regards of ability to make good decisions concerning the appropriate thing to do in various situations, including ability to form opinions regarding their mental health condition.   Assets: Armed forces logistics/support/administrative officer; Desire for Improvement; Housing; Physical Health   Sleep  Sleep:No data recorded   Physical Exam: Physical Exam Constitutional:      General: He is not in acute distress.    Appearance: He is not toxic-appearing.  HENT:     Head: Normocephalic and atraumatic.     Mouth/Throat:     Mouth: Mucous membranes are moist.     Pharynx: Oropharynx is clear.  Pulmonary:     Effort: Pulmonary effort is normal.  Neurological:     General: No focal deficit present.     Mental Status: He is alert.    Review of Systems  Cardiovascular:  Negative for chest pain.  Gastrointestinal:  Negative.   Genitourinary: Negative.   Neurological:  Negative for headaches.   Blood pressure 113/76, pulse 100,  temperature 97.8 F (36.6 C), temperature source Oral, resp. rate 18, height 5\' 10"  (1.778 m), weight 103 kg, SpO2 97 %. Body mass index is 32.57 kg/m.   Treatment Plan Summary: Daily contact with patient to assess and evaluate symptoms and progress in treatment and Medication management  Patient is a 46 year old male with the above-stated past psychiatric history who is seen in follow-up.  Chart reviewed. Patient discussed with nursing. Patient reports stable mood, denies feeling depressed, does not express symptoms of mania. He is compliant with current medications and doses. No medication changes made today.  PLAN: Safety and Monitoring: continue inpatient psych admission; 15-minute checks; daily contact with patient to assess and evaluate symptoms and progress in treatment; psychoeducation.Vital signs: q12 hours. Precautions: suicide, elopement, and assault. Placed on room lock out for meals, snacks and groups.  Psychiatric Problems: Schizoaffective d/o bipolar type (r/o schizophrenia by hx; r/o bipolar II MRE mixed with psychotic features)             -- Continue Depakote ER 1000mg  qhs for mood stabilization  r/b/se/a to VPA discussed including risk of weight gain, liver issues, and need for CBC, LFT and VPA level monitoring Admission CBC WNL and admission AST 23 and ALT 28; repeat CBC today 9/30 -  mostly wnl with lymph 5.4, LFTs - wnl with AST 18, ALT 19, and VPA level 91.             --   Continue Seroquel 300mg  qhs for mood stabilization and paranoia (he does not consent to further dose increases at this time) r/b/se/a to atypical antipsychotic use including risk of developing weight gain, EPS/TD, and metabolic syndrome were discussed and he consents to med trial A1c 5.3, QTC 433ms, and Cholesterol 171, LDL 78, HDL 34, Triglycerides 294, VLDL 59.  -- Encouraged patient  to participate in unit milieu and in scheduled group therapies                  Alcohol use d/o in early remission Cannabis use d/o in early remission R/o anxiolytic/sedative/hypnotic use d/o  -- PDMP reviewed and he last filled Xanax 0.5mg  30 tabs in May 2023 -- Encouraged to continue abstinence from substances and to continue AA and NA               Medical Problems                Tobacco Use Disorder             -- Nicotine gum PRN             -- Smoking cessation encouraged                          HTN  -- patient denies home medications, most recent BP is 113/76- Continue to monitor    GERD  -- Protonix 40mg  daily   Hypertriglyceridemia -- Will discuss need for weight loss, increased exercise and dietary changes with patient - will need PCP f/u after discharge for trending while on an atypical antipsychotic   Admission labs reviewed: TSH 3.607, CBC WNL, CMP WNL other than glucose 111, UDS negative, respiratory panel negative, EKG NSR with QTC 422ms, lipid HDL 34, Triglycerides 294, VLDL 59. , A1c 5.3. Pertinent Labs: CBC - mostly wnl except of lymphocytosis; LFT - wnl, VPA level - therapeutic at 71 ug/ml.   PRN medications: acetaminophen, alum & mag hydroxide-simeth, hydrOXYzine, OLANZapine zydis **AND** LORazepam **AND**  ziprasidone, magnesium hydroxide, nicotine polacrilex, traZODone    Consults: No new consults placed since yesterday    Discharge Planning: Social work and case management to assist with discharge planning and identification of hospital follow-up needs prior to discharge Estimated date of discharge: 10/3 or 10/4 Discharge Concerns: Need to establish a safety plan; Medication compliance and effectiveness Discharge Goals: Return home with outpatient referrals for mental health follow-up including medication management/psychotherapy.   Rosezetta Schlatter, MD 07/30/2022, 8:41 AM

## 2022-07-30 NOTE — Group Note (Signed)
Date:  07/30/2022 Time:  9:40 AM  Group Topic/Focus:  Goals Group:   The focus of this group is to help patients establish daily goals to achieve during treatment and discuss how the patient can incorporate goal setting into their daily lives to aide in recovery. Orientation:   The focus of this group is to educate the patient on the purpose and policies of crisis stabilization and provide a format to answer questions about their admission.  The group details unit policies and expectations of patients while admitted.    Participation Level:  Active  Participation Quality:  Appropriate  Affect:  Appropriate  Cognitive:  Appropriate  Insight: Appropriate  Engagement in Group:  Engaged  Modes of Intervention:  Discussion  Additional Comments:    Garvin Fila 07/30/2022, 9:40 AM

## 2022-07-31 NOTE — [facility] Group Notes (Addendum)
Spiritual care group on grief and loss facilitated by chaplain Janne Napoleon, [facility]   Group Goal:   Support / Education around grief and loss   Members engage in facilitated group support and psycho-social education.   Group Description:   Following introductions and group rules, group members engaged in facilitated group dialog and support around topic of loss, with particular support around experiences of loss in their lives. Group Identified types of loss (relationships / self / things) and identified patterns, circumstances, and changes that precipitate losses. Reflected on thoughts / feelings around loss, normalized grief responses, and recognized variety in grief experience. Group noted Worden's four tasks of grief in discussion.   Group drew on Adlerian / Rogerian, narrative, MI,   Patient Progress: Albert [name] attended group and actively engaged and participated in the group conversation. He shared about the loss of his grandparents and how it bothered him that he did not every cry.  The group supported him and he was able to share about memories with them.  [address] [place] Pager, [phone]

## 2022-07-31 NOTE — Plan of Care (Signed)
Nurse discussed anxiety, depression and coping skills with patient.  

## 2022-07-31 NOTE — Progress Notes (Signed)
Adult Psychoeducational Group Note  Date:  07/31/2022 Time:  8:43 PM  Group Topic/Focus:  Wrap-Up Group:   The focus of this group is to help patients review their daily goal of treatment and discuss progress on daily workbooks.  Participation Level:  Active  Participation Quality:  Appropriate  Affect:  Appropriate  Cognitive:  Appropriate  Insight: Appropriate  Engagement in Group:  Engaged  Modes of Intervention:  Discussion and Education  Additional Comments:  Pt stated his goal for the day is to reflect on why he is in the hospital.  Pt met goal.  Tonia Brooms D 07/31/2022, 8:43 PM

## 2022-07-31 NOTE — Progress Notes (Signed)
D:  Patient sleeps good, no sleep medication.  Good appetite, normal energy level, good concentration.  Rated depression, hopeless and anxiety #3.  Denied withdrawals.  Denied SI.  Denied physical problems.  Denied physical pain.  Goal is attend groups.  Plans to attend groups.  Does have discharge plans. A:  Medications administered per MD orders.  Emotional support and encouragement given patient. R:  Safety maintained with 15 minute checks.  Denied SI and HI, contracts for safety.  Denied A/V hallucinations.

## 2022-07-31 NOTE — Progress Notes (Signed)
Patient came to nursing station and stated his toilet was stopped up.  Informed secretary who called report.  Nurse told patient that we could move him to another room if toilet is not fixed soon. Patient argued with nurse that there were no other rooms, that he had a private room, and that the toilet should be fixed soon. Patient walked back to his room.  MHT heard conversation.  MHT attempted to calm patient.

## 2022-07-31 NOTE — Progress Notes (Signed)
Patient attend AA wrap up group. 

## 2022-07-31 NOTE — Progress Notes (Signed)
[facility] MD Progress Note  07/31/2022 11:07 AM Albert [name]  MRN:  [id number]  Principal Problem: Schizoaffective disorder, bipolar type (HCC) Diagnosis: Principal Problem:   Schizoaffective disorder, bipolar type [facility])  Patient is a  46y.o. male with self-reported past psychiatric hx of bipolar depression, who presented to [facility] voluntarily with thoughts of "hopelessness, sadness, betrayal, and rejection." At his intake assessment as a walk-in to [facility], there was concern for possible suicidal ideation and the patient voluntarily was admitted to [facility] for acute stabilization.   Interval History Patient was seen today for re-evaluation.  Nursing reports no events overnight. The patient has no issues with performing ADLs.  Patient has been medication compliant.    Patient was seen and interviewed by attending psychiatrist. Chart reviewed. Patient discussed during treatment team rounds.  Subjective:  On assessment patient continues to have poor eye contact, although it is improved from previous day.  Poor eye contact does not seem to be due to anxiety nor paranoia; seems to be mostly baseline.    He reports his mood is "pretty good." He reports again sleeping well last night, approximately 8 hours.  He again mentions reading about symptoms of mania and agrees that he was in a manic state prior to admission with decreased need for sleep and extreme racing thoughts, both of which he says have significantly improved with medication regimen.  He denies adverse effects of medications.  He again reports depression 3/10, and anxiety 3/10, which is pretty consistent with previous day. He reports good appetite. He denies auditory or visual hallucinations, denies feeling paranoid, unsafe in the unit, he does not express any delusions. He denies suicidal and homicidal ideation. He denies any physical complaints. He reiterates that he has an appointment with his prior outpatient psychiatrist scheduled for the end of  October and that SW might need to call the office to reschedule it to the closer date; as well he needs to have his therapy appointment rescheduled.  He was reassured that we would discuss with her SW team.  He is hopeful for discharge tomorrow, and plans to groom appropriately as well as continue attending groups today.  Labs: 9/30 - CBC - mostly wnl except of lymphocytosis; LFT - wnl, VPA level - therapeutic at 71 ug/ml.   Past Psychiatric History: see H&P  Past Medical History:  Past Medical History:  Diagnosis Date   Alcoholism (HCC)    Bipolar disorder (HCC)    Drug abuse (HCC)    xanax addiction 5 years ago   Heartburn    Medical non-compliance    Paranoid schizophrenia (HCC)    Schizophrenia (HCC)    Seizures (HCC)     Past Surgical History:  Procedure Laterality Date   NO PAST SURGERIES     Family History:  Family History  Problem Relation Age of Onset   Thyroid disease Mother    Depression Maternal Aunt    Family Psychiatric  History: See H&P Social History:  Social History   Substance and Sexual Activity  Alcohol Use Not Currently   Comment: has been sober since June 4th     Social History   Substance and Sexual Activity  Drug Use Not Currently   Types: Marijuana, Benzodiazepines   Comment: THC: was smoking 1 ounce every day, last use: April & xanax use was 10 mg every day for "a few months"    Social History   Socioeconomic History   Marital status: Single    Spouse name: Not on file  Number of children: Not on file   Years of education: Not on file   Highest education level: Not on file  Occupational History    Comment: Unclear  Tobacco Use   Smoking status: Former    Types: Cigarettes    Quit date: 02/21/2022    Years since quitting: 0.4   Smokeless tobacco: Never   Tobacco comments:    He stopped smoking in April of 2023. He said that he used to smoke "a few packs per day."   Vaping Use   Vaping Use: Never used  Substance and Sexual  Activity   Alcohol use: Not Currently    Comment: has been sober since June 4th   Drug use: Not Currently    Types: Marijuana, Benzodiazepines    Comment: THC: was smoking 1 ounce every day, last use: April & xanax use was 10 mg every day for "a few months"   Sexual activity: Not Currently  Other Topics Concern   Not on file  Social History Narrative   Pt reported that he lives in a house apartment.   Social Determinants of Health   Financial Resource Strain: Not on file  Food Insecurity: Food Insecurity Present (07/23/2022)   Hunger Vital Sign    Worried About Running Out of Food in the Last Year: Sometimes true    Ran Out of Food in the Last Year: Sometimes true  Transportation Needs: No Transportation Needs (07/23/2022)   PRAPARE - Hydrologist (Medical): No    Lack of Transportation (Non-Medical): No  Physical Activity: Not on file  Stress: Not on file  Social Connections: Not on file   Additional Social History:          Sleep: Good  Appetite:  Good  Current Medications: Current Facility-Administered Medications  Medication Dose Route Frequency Provider Last Rate Last Admin   acetaminophen (TYLENOL) tablet 650 mg  650 mg Oral Q6H PRN Niel Hummer, NP       alum & mag hydroxide-simeth (MAALOX/MYLANTA) 200-200-20 MG/5ML suspension 30 mL  30 mL Oral Q4H PRN Niel Hummer, NP       divalproex (DEPAKOTE ER) 24 hr tablet 1,000 mg  1,000 mg Oral QHS Nelda Marseille, Amy E, MD   1,000 mg at 07/30/22 2118   hydrOXYzine (ATARAX) tablet 25 mg  25 mg Oral Q6H PRN Niel Hummer, NP   25 mg at 07/23/22 2352   OLANZapine zydis (ZYPREXA) disintegrating tablet 5 mg  5 mg Oral Q8H PRN Harlow Asa, MD       And   LORazepam (ATIVAN) tablet 1 mg  1 mg Oral PRN Harlow Asa, MD       And   ziprasidone (GEODON) injection 20 mg  20 mg Intramuscular PRN Nelda Marseille, Amy E, MD       magnesium hydroxide (MILK OF MAGNESIA) suspension 30 mL  30 mL Oral Daily PRN  Elmarie Shiley A, NP       nicotine polacrilex (NICORETTE) gum 2 mg  2 mg Oral PRN Nelda Marseille, Amy E, MD       pantoprazole (PROTONIX) EC tablet 40 mg  40 mg Oral Daily Nelda Marseille, Amy E, MD   40 mg at 07/31/22 0805   QUEtiapine (SEROQUEL) tablet 300 mg  300 mg Oral QHS Nelda Marseille, Amy E, MD   300 mg at 07/30/22 2118   traZODone (DESYREL) tablet 50 mg  50 mg Oral QHS PRN Niel Hummer, NP  50 mg at 07/23/22 2352    Lab Results:  No results found for this or any previous visit (from the past 48 hour(s)).   Blood Alcohol level:  Lab Results  Component Value Date   ETH <10 12/16/2020   ETH <10 01/30/2020    Metabolic Disorder Labs: Lab Results  Component Value Date   HGBA1C 5.3 07/25/2022   MPG 105.41 07/25/2022   MPG 100 01/05/2017   Lab Results  Component Value Date   PROLACTIN 30.4 (H) 01/06/2017   PROLACTIN 13.1 01/05/2017   Lab Results  Component Value Date   CHOL 171 07/25/2022   TRIG 294 (H) 07/25/2022   HDL 34 (L) 07/25/2022   CHOLHDL 5.0 07/25/2022   VLDL 59 (H) 07/25/2022   LDLCALC 78 07/25/2022   LDLCALC 109 (H) 01/05/2017    Physical Findings: AIMS: Facial and Oral Movements Muscles of Facial Expression: None, normal Lips and Perioral Area: None, normal Jaw: None, normal Tongue: None, normal,Extremity Movements Upper (arms, wrists, hands, fingers): None, normal Lower (legs, knees, ankles, toes): None, normal, Trunk Movements Neck, shoulders, hips: None, normal, Overall Severity Severity of abnormal movements (highest score from questions above): None, normal Incapacitation due to abnormal movements: None, normal Patient's awareness of abnormal movements (rate only patient's report): No Awareness, Dental Status Current problems with teeth and/or dentures?: No Does patient usually wear dentures?: No  CIWA:  CIWA-Ar Total: 0   Musculoskeletal: Strength & Muscle Tone: within normal limits Gait & Station: normal Patient leans: N/A  Psychiatric Specialty  Exam: Appearance:  CM, appearing stated age, appears well-nourished;  wearing appropriate to situation casual clothes, with fair grooming and hygiene. Normal level of alertness and appropriate facial expression.  Attitude/Behavior: calm, cooperative, engaging, but with limited eye contact (although improved from previous day).  Motor: WNL; dyskinesias not evident.  Speech: spontaneous, clear, coherent, normal comprehension.  Mood: euthymic, "I am pretty good".  Affect: restricted.  Thought process: patient appears coherent, concrete, goal-directed  Thought content: patient denies suicidal thoughts, denies homicidal thoughts; did not express any delusions.  Thought perception: patient denies auditory and visual hallucinations. Did not appear internally stimulated. Appears guarded on exam but not paranoid  Cognition: patient is alert and oriented in self, place, date.  Insight: Fair, appears to have improved understanding of presence, nature, cause, and significance of mental or emotional problem.  Judgement: fair, in regards of ability to make good decisions concerning the appropriate thing to do in various situations, including ability to form opinions regarding their mental health condition.   Assets: Manufacturing systems engineer; Desire for Improvement; Housing; Physical Health   Sleep  Sleep: Good, 8 hours   Physical Exam: Physical Exam Constitutional:      General: He is not in acute distress.    Appearance: He is not toxic-appearing.  HENT:     Head: Normocephalic and atraumatic.     Mouth/Throat:     Mouth: Mucous membranes are moist.     Pharynx: Oropharynx is clear.  Pulmonary:     Effort: Pulmonary effort is normal.  Neurological:     General: No focal deficit present.     Mental Status: He is alert.    Review of Systems  Cardiovascular:  Negative for chest pain.  Gastrointestinal: Negative.   Genitourinary: Negative.   Neurological:  Negative for headaches.    Blood pressure 115/85, pulse (!) 111, temperature (!) 97.5 F (36.4 C), temperature source Oral, resp. rate 16, height 5\' 10"  (1.778 m), weight 103 kg, SpO2  98 %. Body mass index is 32.57 kg/m.   Treatment Plan Summary: Daily contact with patient to assess and evaluate symptoms and progress in treatment and Medication management  Patient is a 46 year old male with the above-stated past psychiatric history who is seen in follow-up.  Chart reviewed. Patient discussed with nursing. Patient reports stable mood, denies feeling depressed, does not express symptoms of mania. He is compliant with current medications and doses. No medication changes made today.  PLAN: Safety and Monitoring: continue inpatient psych admission; 15-minute checks; daily contact with patient to assess and evaluate symptoms and progress in treatment; psychoeducation.Vital signs: q12 hours. Precautions: suicide, elopement, and assault. Placed on room lock out for meals, snacks and groups.  Psychiatric Problems: Schizoaffective d/o bipolar type (r/o schizophrenia by hx; r/o bipolar II MRE mixed with psychotic features)             -- Continue Depakote ER 1000mg  qhs for mood stabilization  r/b/se/a to VPA discussed including risk of weight gain, liver issues, and need for CBC, LFT and VPA level monitoring Admission CBC WNL and admission AST 23 and ALT 28; repeat CBC today 9/30 -  mostly wnl with lymph 5.4, LFTs - wnl with AST 18, ALT 19, and VPA level 91.             --   Continue Seroquel 300mg  qhs for mood stabilization and paranoia (he does not consent to further dose increases at this time) r/b/se/a to atypical antipsychotic use including risk of developing weight gain, EPS/TD, and metabolic syndrome were discussed and he consents to med trial A1c 5.3, QTC 10/30, and Cholesterol 171, LDL 78, HDL 34, Triglycerides 294, VLDL 59.  -- Encouraged patient to participate in unit milieu and in scheduled group therapies  -Patient  with outpatient medication management follow-up 10/25.  SW attempted to schedule earlier appointment, but none available.                 Alcohol use d/o in early remission Cannabis use d/o in early remission R/o anxiolytic/sedative/hypnotic use d/o  -- PDMP reviewed and he last filled Xanax 0.5mg  30 tabs in May 2023 -- Encouraged to continue abstinence from substances and to continue AA and NA               Medical Problems                Tobacco Use Disorder             -- Nicotine gum PRN             -- Smoking cessation encouraged                          HTN  -- patient denies home medications, most recent BP is 115/85- Continue to monitor    GERD  -- Protonix 40mg  daily   Hypertriglyceridemia -- Will discuss need for weight loss, increased exercise and dietary changes with patient - will need PCP f/u after discharge for trending while on an atypical antipsychotic   Admission labs reviewed: TSH 3.607, CBC WNL, CMP WNL other than glucose 111, UDS negative, respiratory panel negative, EKG NSR with QTC 11/25, lipid HDL 34, Triglycerides 294, VLDL 59. , A1c 5.3. Pertinent Labs: CBC - mostly wnl except of lymphocytosis; LFT - wnl, VPA level - therapeutic at 71 ug/ml.   PRN medications: acetaminophen, alum & mag hydroxide-simeth, hydrOXYzine, OLANZapine zydis **AND** LORazepam **AND** ziprasidone, magnesium hydroxide,  nicotine polacrilex, traZODone    Consults: No new consults   Discharge Planning: Social work and case management to assist with discharge planning and identification of hospital follow-up needs prior to discharge Estimated date of discharge: 10/4 Discharge Concerns: Need to establish a safety plan; Medication compliance and effectiveness Discharge Goals: Return home with outpatient referrals for mental health follow-up including medication management/psychotherapy.   Lamar Sprinklesourtney Dixie Jafri, MD 07/31/2022, 11:07 AM

## 2022-08-01 MED ORDER — QUETIAPINE FUMARATE 300 MG PO TABS: 300.0000 mg | ORAL_TABLET | Freq: Every day | ORAL | 0 refills | Status: DC

## 2022-08-01 MED ORDER — DIVALPROEX SODIUM ER 500 MG PO TB24: 1000.0000 mg | ORAL_TABLET | Freq: Every day | ORAL | 0 refills | Status: DC

## 2022-08-01 NOTE — [facility] Suicide Risk Assessment (Signed)
[facility] Discharge Suicide Risk Assessment   Principal Problem: Schizoaffective disorder, bipolar type [facility]) Discharge Diagnoses: Principal Problem:   Schizoaffective disorder, bipolar type (HCC)   Total Time spent with patient: 45 minutes  Reason for admission: Albert [name] is a 46 y.o. male with self-reported past psychiatric hx of bipolar depression, who presented to [facility] voluntarily with thoughts of "hopelessness, sadness, betrayal, and rejection." At his intake assessment as a walk-in to [facility], there was concern for possible suicidal ideation and the patient voluntarily was admitted to [facility] for acute stabilization.  PTA Medications: Advil prn for pain and prilosec prn for heart burn  Hospital Course:   During the patient's hospitalization, patient had extensive initial psychiatric evaluation, and follow-up psychiatric evaluations every day.  Psychiatric diagnoses provided upon initial assessment: Schizoaffective disorder, bipolar type (HCC)  Patient's psychiatric medications were adjusted on admission: restarted depakote 1000 mg at bedtime for mood stabilization, restarted seroquel 100 mg at bedtime for mood and sleep  During the hospitalization, other adjustments were made to the patient's psychiatric medication regimen: depakote was continued, seroquel was titrated to 300 mg at bedtime.  Patient's care was discussed during the interdisciplinary team meeting every day during the hospitalization.  The patient denies having side effects to prescribed psychiatric medication.  Gradually, patient started adjusting to milieu. The patient was evaluated each day by a clinical provider to ascertain response to treatment. Improvement was noted by the patient's report of decreasing symptoms, improved sleep and appetite, affect, medication tolerance, behavior, and participation in unit programming.  Patient was asked each day to complete a self inventory noting mood, mental status, pain, new  symptoms, anxiety and concerns.    Symptoms were reported as significantly decreased or resolved completely by discharge.   On day of discharge, patient was evaluated the patient reports that their mood is stable. The patient denied having suicidal thoughts for more than 48 hours prior to discharge.  Patient denies having homicidal thoughts.  Patient denies having auditory hallucinations.  Patient denies any visual hallucinations or other symptoms of psychosis. The patient was motivated to continue taking medication with a goal of continued improvement in mental health.   The patient reports their target psychiatric symptoms of paranoia, poor sleep and unstable hyper mood as well as racing thoughts responded well to the psychiatric medications, and the patient reports overall benefit other psychiatric hospitalization. Supportive psychotherapy was provided to the patient. The patient also participated in regular group therapy while hospitalized. Coping skills, problem solving as well as relaxation therapies were also part of the unit programming.  Labs were reviewed with the patient, and abnormal results were discussed with the patient.  The patient is able to verbalize their individual safety plan to this provider.  Behavioral Events: none  Restraints: none  Groups:attended and participated  Medications Changes: as above  D/C Medications: seroquel 300 mg at bedtime and depakote 1000 mg at bedtime  Sleep  Sleep: good, improved per patient's report  Musculoskeletal: Strength & Muscle Tone: within normal limits Gait & Station: normal Patient leans: N/A  Psychiatric Specialty Exam  General Appearance: appears at stated age, fairly dressed and groomed  Behavior: pleasant and cooperative  Psychomotor Activity:No psychomotor agitation or retardation noted   Eye Contact: good Speech: normal amount, tone, volume and latency   Mood: euthymic Affect: congruent, pleasant and  interactive  Thought Process: linear, goal directed, no circumstantial or tangential thought process noted, no racing thoughts or flight of ideas Descriptions of Associations: intact Thought Content: Hallucinations:  denies AH, VH , does not appear responding to stimuli Delusions: No paranoia or other delusions noted Suicidal Thoughts: denies SI, intention, plan  Homicidal Thoughts: denies HI, intention, plan   Alertness/Orientation: alert and fully oriented  Insight: fair, improved Judgment: fair, improved  Memory: intact  Executive Functions  Concentration: intact  Attention Span: Fair Recall: intact Fund of Knowledge: fair   Art therapist  Concentration: intact Attention Span: Fair Recall: intact Fund of Knowledge: fair   Assets  Assets: Manufacturing systems engineer; Desire for Improvement; Housing; Physical Health   Physical Exam: Physical Exam ROS Blood pressure 120/77, pulse (!) 116, temperature (!) 97.5 F (36.4 C), temperature source Oral, resp. rate 17, height 5\' 10"  (1.778 m), weight 103 kg, SpO2 97 %. Body mass index is 32.57 kg/m.  Mental Status Per Nursing Assessment::   On Admission:  Self-harm thoughts, Suicidal ideation indicated by others  Demographic Factors:  Male, Caucasian, Living alone, and Unemployed  Loss Factors: NA  Historical Factors: Impulsivity  Risk Reduction Factors:   Positive social support  Continued Clinical Symptoms: improved and stabilized during hospital stay Bipolar Disorder:   Mixed State  Cognitive Features That Contribute To Risk:  None    Suicide Risk:  Minimal: No identifiable suicidal ideation.  Patients presenting with no risk factors but with morbid ruminations; may be classified as minimal risk based on the severity of the depressive symptoms   Follow-up Information     [facility]. Go on 08/22/2022.   Specialty: Behavioral Health Why: You have an appointment with Dr. 08/24/2022  for medication management services on 08/22/22 at 11:40 am.  This appointment will be held in person. Contact information: [address] [place] [phone]         [phone], LCSW. Schedule an appointment as soon as possible for a visit.   Why: Please call to schedule an appointment for therapy services with your provider as soon as possible, as we have been unable to contact. Contact information: [address] [place], [place]  Phone: [phone]                Plan Of Care/Follow-up recommendations:   Discharge recommendations:     Activity: as tolerated  Diet: heart healthy  # It is recommended to the patient to continue psychiatric medications as prescribed, after discharge from the hospital.     # It is recommended to the patient to follow up with your outpatient psychiatric provider and PCP.   # It was discussed with the patient, the impact of alcohol, drugs, tobacco have been there overall psychiatric and medical wellbeing, and total abstinence from substance use was recommended the patient.ed.   # Prescriptions provided or sent directly to preferred pharmacy at discharge. Patient agreeable to plan. Given opportunity to ask questions. Appears to feel comfortable with discharge.    # In the event of worsening symptoms, the patient is instructed to call the crisis hotline, 911 and or go to the nearest ED for appropriate evaluation and treatment of symptoms. To follow-up with primary care provider for other medical issues, concerns and or health care needs   # Patient was discharged home with a plan to follow up as noted above.  -Follow-up with outpatient primary care doctor and other specialists -for management of chronic medical disease, including: patient was recommended to follow with pcp at time of dc for elevated blood cholesterol.   Patient agrees with D/C instructions and  plan.  The patient received  suicide prevention pamphlet:  Yes Belongings returned:  Clothing and Valuables  Total Time Spent in Direct Patient Care:  I personally spent 45 minutes on the unit in direct patient care. The direct patient care time included face-to-face time with the patient, reviewing the patient's chart, communicating with other professionals, and coordinating care. Greater than 50% of this time was spent in counseling or coordinating care with the patient regarding goals of hospitalization, psycho-education, and discharge planning needs.   Albert [name] 08/01/2022, 9:23 AM   Albert [name] [place], MD 08/01/2022, 9:23 AM

## 2022-08-01 NOTE — Progress Notes (Signed)
  [facility] Adult Case Management Discharge Plan :  Will you be returning to the same living situation after discharge:  Yes,  Home  At discharge, do you have transportation home?: Yes,  Own Car  Do you have the ability to pay for your medications: Yes,  [employer] of information consent forms completed and in the chart;  Patient's signature needed at discharge.  Patient to Follow up at:  Follow-up [place], [facility]. Go on 08/22/2022.   Specialty: Behavioral Health Why: You have an appointment with Dr. Reece Levy for medication management services on 08/22/22 at 11:40 am.  This appointment will be held in person. Contact information: [place] [address] [place] [address] [phone]         August Luz, LCSW. Schedule an appointment as soon as possible for a visit.   Why: Please call to schedule an appointment for therapy services with your provider as soon as possible, as we have been unable to contact. Contact information: [place] [place], [place] [address]  Phone: [phone]                Next level of care provider has access to [place] and Suicide Prevention discussed: Yes,  with patient and mother      Has patient been referred to the Quitline?: N/A patient is not a smoker  Patient has been referred for addiction treatment: [place], [place] 08/01/2022, 9:06 AM

## 2022-08-01 NOTE — Progress Notes (Signed)
     07/31/22 2123  Psych Admission Type (Psych Patients Only)  Admission Status Voluntary  Psychosocial Assessment  Patient Complaints Anxiety;Depression  Eye Contact Brief  Facial Expression Flat  Affect Anxious  Speech Logical/coherent  Interaction Minimal  Motor Activity Slow  Appearance/Hygiene Unremarkable  Behavior Characteristics Cooperative  Mood Anxious  Thought Process  Coherency WDL  Content WDL  Delusions None reported or observed  Perception WDL  Hallucination None reported or observed  Judgment WDL  Confusion None  Danger to Self  Current suicidal ideation? Denies  Self-Injurious Behavior No self-injurious ideation or behavior indicators observed or expressed   Agreement Not to Harm Self Yes  Description of Agreement verbal  Danger to Others  Danger to Others None reported or observed

## 2022-08-01 NOTE — Group Note (Signed)
Recreation Therapy Group Note   Group Topic:Team Building  Group Date: 08/01/2022 Start Time: 0930 End Time: 0959 Facilitators: Lucillia Corson-McCall, LRT,CTRS Location: [address] Day[address]   Goal Area(s) Addresses:  Patient will effectively work with peer towards shared goal.  Patient will identify skill used to make activity successful.  Patient will identify how skills used during activity can be used to reach post d/c goals.    Group Description: Patient(s) were given a set of solo cups, a rubber band, and some tied strings. The objective is to build a pyramid with the cups by only using the rubber band and string to move the cups. After the activity the patient(s) are LRT debriefed and discussed what strategies worked, what didn't, and what lessons they can take from the activity and use in life post discharge.    Affect/Mood: Appropriate   Participation Level: Engaged   Participation Quality: Independent   Behavior: Appropriate   Speech/Thought Process: Distracted and Focused   Insight: Good   Judgement: Good   Modes of Intervention: Cooperative Play   Patient Response to Interventions:  Attentive and Engaged   Education Outcome:  Acknowledges education and In group clarification offered    Clinical Observations/Individualized Feedback: Pt was engaged and working well with peers.  Pt then became distracted and started working on some paperwork.  Once finished, pt sat and observed for the remainder of group.     Plan: Continue to engage patient in RT group sessions 2-3x/week.   Jaylee Freeze-McCall, LRT,CTRS 08/01/2022 11:56 AM

## 2022-08-01 NOTE — Plan of Care (Signed)
Patient reports he slept well last night. He reports his appetite has been good. Concentration is good. Rates depression, hopelessness and anxiety 1/10. He reports he feels better versus the day he came in. Patient denies SI/HI/AVH. Takes medications as prescribed. Denies any other problems today.   Problem: Education: Goal: Knowledge of [place] [facility] Education information/materials will improve Outcome: Adequate for Discharge Goal: Emotional status will improve Outcome: Adequate for Discharge Goal: Mental status will improve Outcome: Adequate for Discharge Goal: Verbalization of understanding the information provided will improve Outcome: Adequate for Discharge   Problem: Activity: Goal: Interest or engagement in activities will improve Outcome: Adequate for Discharge Goal: Sleeping patterns will improve Outcome: Adequate for Discharge   Problem: Coping: Goal: Ability to verbalize frustrations and anger appropriately will improve Outcome: Adequate for Discharge Goal: Ability to demonstrate self-control will improve Outcome: Adequate for Discharge   Problem: Health Behavior/Discharge Planning: Goal: Identification of resources available to assist in meeting health care needs will improve Outcome: Adequate for Discharge Goal: Compliance with treatment plan for underlying cause of condition will improve Outcome: Adequate for Discharge   Problem: Physical Regulation: Goal: Ability to maintain clinical measurements within normal limits will improve Outcome: Adequate for Discharge   Problem: Safety: Goal: Periods of time without injury will increase Outcome: Adequate for Discharge   Problem: Education: Goal: Ability to make informed decisions regarding treatment will improve Outcome: Adequate for Discharge   Problem: Coping: Goal: Coping ability will improve Outcome: Adequate for Discharge   Problem: Health Behavior/Discharge Planning: Goal: Identification of  resources available to assist in meeting health care needs will improve Outcome: Adequate for Discharge   Problem: Medication: Goal: Compliance with prescribed medication regimen will improve Outcome: Adequate for Discharge   Problem: Self-Concept: Goal: Ability to disclose and discuss suicidal ideas will improve Outcome: Adequate for Discharge Goal: Will verbalize positive feelings about self Outcome: Adequate for Discharge   Problem: Education: Goal: Utilization of techniques to improve thought processes will improve Outcome: Adequate for Discharge Goal: Knowledge of the prescribed therapeutic regimen will improve Outcome: Adequate for Discharge   Problem: Activity: Goal: Interest or engagement in leisure activities will improve Outcome: Adequate for Discharge Goal: Imbalance in normal sleep/wake cycle will improve Outcome: Adequate for Discharge   Problem: Coping: Goal: Coping ability will improve Outcome: Adequate for Discharge Goal: Will verbalize feelings Outcome: Adequate for Discharge   Problem: Health Behavior/Discharge Planning: Goal: Ability to make decisions will improve Outcome: Adequate for Discharge Goal: Compliance with therapeutic regimen will improve Outcome: Adequate for Discharge   Problem: Role Relationship: Goal: Will demonstrate positive changes in social behaviors and relationships Outcome: Adequate for Discharge   Problem: Safety: Goal: Ability to disclose and discuss suicidal ideas will improve Outcome: Adequate for Discharge Goal: Ability to identify and utilize support systems that promote safety will improve Outcome: Adequate for Discharge   Problem: Self-Concept: Goal: Will verbalize positive feelings about self Outcome: Adequate for Discharge Goal: Level of anxiety will decrease Outcome: Adequate for Discharge

## 2022-08-01 NOTE — Discharge Summary (Addendum)
Physician Discharge Summary Note  Patient:  Albert [name] is an 46 y.o., male MRN:  [id number] DOB:  01-14-1976 Patient phone:  [phone] (home)  Patient address:   [place] [address],  Total Time spent with patient: 45 minutes  Date of Admission:  07/23/2022 Date of Discharge: 08/01/2022  Reason for Admission:  Albert [name] is a 46 y.o. male with self-reported past psychiatric hx of bipolar depression, who presented to [facility] voluntarily with thoughts of "hopelessness, sadness, betrayal, and rejection." At his intake assessment as a walk-in to [facility], there was concern for possible suicidal ideation and the patient voluntarily was admitted to [facility] for acute stabilization.  Principal Problem: Schizoaffective disorder, bipolar type [facility]) Discharge Diagnoses: Principal Problem:   Schizoaffective disorder, bipolar type [facility])   Past Psychiatric History: Previous Psych Diagnoses: Per patient diagnosed with bipolar depression in 1999 and polysubstance use (per EHR previously diagnosed with schizophrenia and schizoaffective d/o) Prior inpatient treatment: Per patient he has been admitted previously to [facility], [place], [place], and [place], with most recent hospitalization last year (per EHR was last admitted to [place] in Dec 2020) Current/prior outpatient treatment: discontinued home-meds, last saw psychiatrist 5 months ago  History of suicide attempts: denies but admits to having SI with access to a gun in 1999 History of homicidal thinking: denies  Psychiatric medication history: Seroquel and Depakote, previously tried and did not like: Abilify, Geodon due to "feeling like a zombie,"  Risperidone and  Invega sustenna (per EHR was also previously on Trazodone, Abilify maintena, Saphris, cogentin, prolixin, Neurontin, Haldol, vistaril, ativan, Remeron, Invega po, Trilafon, Inderal,zoloft, Ambien) Psychiatric medication compliance history:  self-discontinued Seroquel and Depakote 4 months ago  Neuromodulation history: Denies TMS/ECT  Current Psychiatrist: Dr. Reece Levy (last seen 5 months ago) History of SIB: denied PHP/IOP attendance: denied  Past Medical History:  Past Medical History:  Diagnosis Date   Alcoholism ([place])    Bipolar disorder ([place])    Drug abuse ([place])    xanax addiction 5 years ago   Heartburn    Medical non-compliance    Paranoid schizophrenia ([place])    Schizophrenia ([place])    Seizures ([place])     Past Surgical History:  Procedure Laterality Date   NO PAST SURGERIES     Family History:  Family History  Problem Relation Age of Onset   Thyroid disease Mother    Depression Maternal Aunt    Family Psychiatric  History: Psych: 2 maternal aunts with bipolar disorder  SA/HA: denies completed suicides in blood relatives Substance use family hx: denies in blood relatives  Social History:  Social History   Substance and Sexual Activity  Alcohol Use Not Currently   Comment: has been sober since June 4th     Social History   Substance and Sexual Activity  Drug Use Not Currently   Types: Marijuana, Benzodiazepines   Comment: THC: was smoking 1 ounce every day, last use: April & xanax use was 10 mg every day for "a few months"    Social History   Socioeconomic History   Marital status: Single    Spouse name: Not on file   Number of children: Not on file   Years of education: Not on file   Highest education level: Not on file  Occupational History    Comment: Unclear  Tobacco Use   Smoking status: Former    Types: Cigarettes    Quit date: 02/21/2022    Years since  quitting: 0.4   Smokeless tobacco: Never   Tobacco comments:    He stopped smoking in April of 2023. He said that he used to smoke "a few packs per day."   Vaping Use   Vaping Use: Never used  Substance and Sexual Activity   Alcohol use: Not Currently    Comment: has been sober since June 4th   Drug use: Not Currently    Types:  Marijuana, Benzodiazepines    Comment: THC: was smoking 1 ounce every day, last use: April & xanax use was 10 mg every day for "a few months"   Sexual activity: Not Currently  Other Topics Concern   Not on file  Social History Narrative   Pt reported that he lives in a house apartment.   Social Determinants of Health   Financial Resource Strain: Not on file  Food Insecurity: Food Insecurity Present (07/23/2022)   Hunger Vital Sign    Worried About Running Out of Food in the Last Year: Sometimes true    Ran Out of Food in the Last Year: Sometimes true  Transportation Needs: No Transportation Needs (07/23/2022)   PRAPARE - Hydrologist (Medical): No    Lack of Transportation (Non-Medical): No  Physical Activity: Not on file  Stress: Not on file  Social Connections: Not on file    Hospital Course:  During the patient's hospitalization, patient had extensive initial psychiatric evaluation, and follow-up psychiatric evaluations every day.   Psychiatric diagnoses provided upon initial assessment: Schizoaffective disorder, bipolar type ([place])   Patient's psychiatric medications were adjusted on admission: restarted depakote 1000 mg at bedtime for mood stabilization, restarted seroquel 100 mg at bedtime for mood and sleep   During the hospitalization, other adjustments were made to the patient's psychiatric medication regimen: depakote was continued, seroquel was titrated to 300 mg at bedtime.   Patient's care was discussed during the interdisciplinary team meeting every day during the hospitalization.   The patient denies having side effects to prescribed psychiatric medication.   Gradually, patient started adjusting to milieu. The patient was evaluated each day by a clinical provider to ascertain response to treatment. Improvement was noted by the patient's report of decreasing symptoms, improved sleep and appetite, affect, medication tolerance, behavior, and  participation in unit programming.  Patient was asked each day to complete a self inventory noting mood, mental status, pain, new symptoms, anxiety and concerns.     Symptoms were reported as significantly decreased or resolved completely by discharge.    On day of discharge, patient was evaluated the patient reports that their mood is stable. The patient denied having suicidal thoughts for more than 48 hours prior to discharge.  Patient denies having homicidal thoughts.  Patient denies having auditory hallucinations.  Patient denies any visual hallucinations or other symptoms of psychosis. The patient was motivated to continue taking medication with a goal of continued improvement in mental health.    The patient reports their target psychiatric symptoms of paranoia, poor sleep and unstable hyper mood as well as racing thoughts responded well to the psychiatric medications, and the patient reports overall benefit other psychiatric hospitalization. Supportive psychotherapy was provided to the patient. The patient also participated in regular group therapy while hospitalized. Coping skills, problem solving as well as relaxation therapies were also part of the unit programming.   Labs were reviewed with the patient, and abnormal results were discussed with the patient.   The patient is able to verbalize their  individual safety plan to this provider.   Behavioral Events: none   Restraints: none   Groups:attended and participated   Medications Changes: as above   D/C Medications: seroquel 300 mg at bedtime and depakote 1000 mg at bedtime  Physical Findings: AIMS: Facial and Oral Movements Muscles of Facial Expression: None, normal Lips and Perioral Area: None, normal Jaw: None, normal Tongue: None, normal,Extremity Movements Upper (arms, wrists, hands, fingers): None, normal Lower (legs, knees, ankles, toes): None, normal, Trunk Movements Neck, shoulders, hips: None, normal, Overall  Severity Severity of abnormal movements (highest score from questions above): None, normal Incapacitation due to abnormal movements: None, normal Patient's awareness of abnormal movements (rate only patient's report): No Awareness, Dental Status Current problems with teeth and/or dentures?: No Does patient usually wear dentures?: No  CIWA:  CIWA-Ar Total: 0 COWS:     Musculoskeletal: Strength & Muscle Tone: within normal limits Gait & Station: normal Patient leans: N/A   Psychiatric Specialty Exam:  General Appearance: appears at stated age, fairly dressed and groomed  Behavior: pleasant and cooperative  Psychomotor Activity:No psychomotor agitation or retardation noted   Eye Contact: good Speech: normal amount, tone, volume and latency   Mood: euthymic Affect: congruent, pleasant and interactive  Thought Process: linear, goal directed, no circumstantial or tangential thought process noted, no racing thoughts or flight of ideas Descriptions of Associations: intact Thought Content: Hallucinations: denies AH, VH , does not appear responding to stimuli Delusions: No paranoia or other delusions noted Suicidal Thoughts: denies SI, intention, plan  Homicidal Thoughts: denies HI, intention, plan   Alertness/Orientation: alert and fully oriented  Insight: fair, improved Judgment: fair, improved  Memory: intact  Executive Functions  Concentration: intact  Attention Span: Fair Recall: intact Fund of Knowledge: fair   Assets  Assets: Armed forces logistics/support/administrative officer; Desire for Improvement; Housing; Physical Health   Sleep Sleep: Good, improved since admission   Physical Exam:  Physical Exam Vitals and nursing note reviewed.  Constitutional:      Appearance: Normal appearance.  HENT:     Head: Normocephalic and atraumatic.  Eyes:     Pupils: Pupils are equal, round, and reactive to light.  Pulmonary:     Effort: Pulmonary effort is normal.  Musculoskeletal:         General: Normal range of motion.     Cervical back: Normal range of motion.  Neurological:     General: No focal deficit present.     Mental Status: He is alert and oriented to person, place, and time.    Review of Systems  All other systems reviewed and are negative.  Blood pressure 120/77, pulse (!) 116, temperature (!) 97.5 F (36.4 C), temperature source Oral, resp. rate 17, height 5\' 10"  (1.778 m), weight 103 kg, SpO2 97 %. Body mass index is 32.57 kg/m.   Social History   Tobacco Use  Smoking Status Former   Types: Cigarettes   Quit date: 02/21/2022   Years since quitting: 0.4  Smokeless Tobacco Never  Tobacco Comments   He stopped smoking in April of 2023. He said that he used to smoke "a few packs per day."    Tobacco Cessation:  Prescription not provided because: patient didn't need any during hospital stay, patient refused at time of dc   Blood Alcohol level:  Lab Results  Component Value Date   [facility] <10 12/16/2020   ETH <10 [id number]    Metabolic Disorder Labs:  Lab Results  Component Value Date   HGBA1C 5.3  07/25/2022   MPG 105.41 07/25/2022   MPG 100 01/05/2017   Lab Results  Component Value Date   PROLACTIN 30.4 (H) 01/06/2017   PROLACTIN 13.1 01/05/2017   Lab Results  Component Value Date   CHOL 171 07/25/2022   TRIG 294 (H) 07/25/2022   HDL 34 (L) 07/25/2022   CHOLHDL 5.0 07/25/2022   VLDL 59 (H) 07/25/2022   LDLCALC 78 07/25/2022   LDLCALC 109 (H) 01/05/2017    See Psychiatric Specialty Exam and Suicide Risk Assessment completed by Attending Physician prior to discharge.  Discharge destination:  Home  Is patient on multiple antipsychotic therapies at discharge:  No   Has Patient had three or more failed trials of antipsychotic monotherapy by history:  No  Recommended Plan for Multiple Antipsychotic Therapies: NA  Discharge Instructions     Diet - low sodium heart healthy   Complete by: As directed    Increase activity slowly    Complete by: As directed       Allergies as of 08/01/2022   No Known Allergies      Medication List     STOP taking these medications    ibuprofen 200 MG tablet Commonly known as: ADVIL       TAKE these medications      Indication  divalproex 500 MG 24 hr tablet Commonly known as: DEPAKOTE ER Take 2 tablets (1,000 mg total) by mouth at bedtime.  Indication: MIXED BIPOLAR AFFECTIVE DISORDER   omeprazole 20 MG tablet Commonly known as: PRILOSEC OTC Take 20 mg by mouth daily as needed (For heartburn or acid reflux).  Indication: Heartburn   QUEtiapine 300 MG tablet Commonly known as: SEROQUEL Take 1 tablet (300 mg total) by mouth at bedtime.  Indication: [place]. Go on 08/22/2022.   Specialty: Behavioral Health Why: You have an appointment with Dr. Reece Levy for medication management services on 08/22/22 at 11:40 am.  This appointment will be held in person. Contact information: [address] [place] [address] [place] [address] [phone]         August Luz, LCSW. Schedule an appointment as soon as possible for a visit.   Why: Please call to schedule an appointment for therapy services with your provider as soon as possible, as we have been unable to contact. Contact information: [place]. [place], [place] [address]  Phone: [phone]                Discharge recommendations:    Activity: as tolerated  Diet: heart healthy  # It is recommended to the patient to continue psychiatric medications as prescribed, after discharge from the hospital.     # It is recommended to the patient to follow up with your outpatient psychiatric provider and PCP.   # It was discussed with the patient, the impact of alcohol, drugs, tobacco have been there overall psychiatric and medical wellbeing, and total abstinence from substance use was recommended the patient.ed.    # Prescriptions provided or sent directly to preferred pharmacy at discharge. Patient agreeable to plan. Given opportunity to ask questions. Appears to feel comfortable with discharge.    # In the event of worsening symptoms, the patient is instructed to call the crisis hotline, 911 and or go to the nearest ED for appropriate evaluation and treatment of symptoms. To follow-up with primary care provider for other medical issues, concerns and  or health care needs   # Patient was discharged home with a plan to follow up as noted above.  -Follow-up with outpatient primary care doctor and other specialists -for management of chronic medical disease, including: patient was recommended to follow with pcp at time of dc for elevated blood cholesterol.   Patient agrees with D/C instructions and plan.   The patient received suicide prevention pamphlet:  Yes Belongings returned:  Clothing and Valuables  Total Time Spent in Direct Patient Care:  I personally spent 45 minutes on the unit in direct patient care. The direct patient care time included face-to-face time with the patient, reviewing the patient's chart, communicating with other professionals, and coordinating care. Greater than 50% of this time was spent in counseling or coordinating care with the patient regarding goals of hospitalization, psycho-education, and discharge planning needs.    SignedDian Situ, MD 08/01/2022, 10:20 AM

## 2022-08-01 NOTE — Plan of Care (Signed)
°  Problem: Education: °Goal: Emotional status will improve °Outcome: Progressing °Goal: Mental status will improve °Outcome: Progressing °  °Problem: Activity: °Goal: Interest or engagement in activities will improve °Outcome: Progressing °  °

## 2022-09-21 ENCOUNTER — Ambulatory Visit: Payer: Self-pay | Admitting: *Deleted

## 2022-09-21 NOTE — Telephone Encounter (Signed)
Patient called and when transferred call was disconnected. Called patient back and reviewed sx.  Chief Complaint: sore throat anxiety throat will close., fatigue  Symptoms: see above  Frequency: last night  Pertinent Negatives: Patient denies chest pain no difficulty breathing, no fever, no cough , no cold sx Disposition: [] ED /[x] Urgent Care (no appt availability in office) / [] Appointment(In office/virtual)/ []  [facility]/ [] Home Care/ [] Refused Recommended Disposition /[]  Mobile Bus/ []  Follow-up with PCP Additional Notes:   Recommended care advise and if sx worsen go to UC /ED if needed.     Reason for Disposition  [1] Sore throat is the only symptom AND [2] present > 48 hours    Started last night  Answer Assessment - Initial Assessment Questions 1. ONSET: "When did the throat start hurting?" (Hours or days ago)      Last night  2. SEVERITY: "How bad is the sore throat?" (Scale 1-10; mild, moderate or severe)   - MILD (1-3):  Doesn't interfere with eating or normal activities.   - MODERATE (4-7): Interferes with eating some solids and normal activities.   - SEVERE (8-10):  Excruciating pain, interferes with most normal activities.   - SEVERE WITH DYSPHAGIA (10): Can't swallow liquids, drooling.     Does not interfere with eating or drinking  3. STREP EXPOSURE: "Has there been any exposure to strep within the past week?" If Yes, ask: "What type of contact occurred?"      na 4.  VIRAL SYMPTOMS: "Are there any symptoms of a cold, such as a runny nose, cough, hoarse voice or red eyes?"      No  5. FEVER: "Do you have a fever?" If Yes, ask: "What is your temperature, how was it measured, and when did it start?"     na 6. PUS ON THE TONSILS: "Is there pus on the tonsils in the back of your throat?"     na 7. OTHER SYMPTOMS: "Do you have any other symptoms?" (e.g., difficulty breathing, headache, rash)     Anxiety regarding throat closing  8. PREGNANCY: "Is  there any chance you are pregnant?" "When was your last menstrual period?"     na  Protocols used: Sore Throat-A-AH

## 2023-05-25 ENCOUNTER — Emergency Department (HOSPITAL_COMMUNITY)
Admission: EM | Admit: 2023-05-25 | Discharge: 2023-05-26 | Disposition: A | Discharge status: Other Acute Inpatient Hospital | Payer: 59 | Attending: Emergency Medicine | Admitting: Emergency Medicine

## 2023-05-25 ENCOUNTER — Other Ambulatory Visit: Payer: Self-pay

## 2023-05-25 ENCOUNTER — Encounter (HOSPITAL_COMMUNITY): Payer: Self-pay

## 2023-05-25 DIAGNOSIS — R440 Auditory hallucinations: Secondary | ICD-10-CM | POA: Diagnosis present

## 2023-05-25 DIAGNOSIS — Z87891 Personal history of nicotine dependence: Secondary | ICD-10-CM | POA: Insufficient documentation

## 2023-05-25 DIAGNOSIS — R000 Tachycardia, unspecified: Secondary | ICD-10-CM | POA: Insufficient documentation

## 2023-05-25 DIAGNOSIS — R443 Hallucinations, unspecified: Secondary | ICD-10-CM

## 2023-05-25 DIAGNOSIS — E039 Hypothyroidism, unspecified: Secondary | ICD-10-CM | POA: Insufficient documentation

## 2023-05-25 DIAGNOSIS — F200 Paranoid schizophrenia: Secondary | ICD-10-CM | POA: Diagnosis present

## 2023-05-25 DIAGNOSIS — Z20822 Contact with and (suspected) exposure to covid-19: Secondary | ICD-10-CM | POA: Diagnosis not present

## 2023-05-25 LAB — CBC WITH DIFFERENTIAL/PLATELET
Abs Immature Granulocytes: 0.03 10*3/uL (ref 0.00–0.07)
Basophils Absolute: 0.1 10*3/uL (ref 0.0–0.1)
Basophils Relative: 1 %
Eosinophils Absolute: 0 10*3/uL (ref 0.0–0.5)
Eosinophils Relative: 0 %
HCT: 43.8 % (ref 39.0–52.0)
Hemoglobin: 15 g/dL (ref 13.0–17.0)
Immature Granulocytes: 0 %
Lymphocytes Relative: 36 %
Lymphs Abs: 3.2 10*3/uL (ref 0.7–4.0)
MCH: 31.8 pg (ref 26.0–34.0)
MCHC: 34.2 g/dL (ref 30.0–36.0)
MCV: 92.8 fL (ref 80.0–100.0)
Monocytes Absolute: 0.6 10*3/uL (ref 0.1–1.0)
Monocytes Relative: 7 %
Neutro Abs: 5.1 10*3/uL (ref 1.7–7.7)
Neutrophils Relative %: 56 %
Platelets: 324 10*3/uL (ref 150–400)
RBC: 4.72 MIL/uL (ref 4.22–5.81)
RDW: 12.7 % (ref 11.5–15.5)
WBC: 9.1 10*3/uL (ref 4.0–10.5)
nRBC: 0 % (ref 0.0–0.2)

## 2023-05-25 LAB — COMPREHENSIVE METABOLIC PANEL
ALT: 19 U/L (ref 0–44)
AST: 20 U/L (ref 15–41)
Albumin: 4.2 g/dL (ref 3.5–5.0)
Alkaline Phosphatase: 49 U/L (ref 38–126)
Anion gap: 13 (ref 5–15)
BUN: 7 mg/dL (ref 6–20)
CO2: 21 mmol/L — ABNORMAL LOW (ref 22–32)
Calcium: 8.8 mg/dL — ABNORMAL LOW (ref 8.9–10.3)
Chloride: 102 mmol/L (ref 98–111)
Creatinine, Ser: 0.77 mg/dL (ref 0.61–1.24)
GFR, Estimated: >60 mL/min (ref >60)
Glucose, Bld: 108 mg/dL — ABNORMAL HIGH (ref 70–99)
Potassium: 3.6 mmol/L (ref 3.5–5.1)
Sodium: 136 mmol/L (ref 135–145)
Total Bilirubin: 0.8 mg/dL (ref 0.3–1.2)
Total Protein: 7.9 g/dL (ref 6.5–8.1)

## 2023-05-25 LAB — VALPROIC ACID LEVEL: Valproic Acid Lvl: 61 ug/mL (ref 50.0–100.0)

## 2023-05-25 LAB — RAPID URINE DRUG SCREEN, HOSP PERFORMED
Amphetamines: NOT DETECTED
Barbiturates: NOT DETECTED
Benzodiazepines: NOT DETECTED
Cocaine: NOT DETECTED
Opiates: NOT DETECTED
Tetrahydrocannabinol: NOT DETECTED

## 2023-05-25 LAB — ETHANOL: Alcohol, Ethyl (B): <10 mg/dL (ref <10)

## 2023-05-25 LAB — TSH: TSH: 2.109 u[IU]/mL (ref 0.350–4.500)

## 2023-05-25 LAB — SARS CORONAVIRUS 2 BY RT PCR: SARS Coronavirus 2 by RT PCR: NEGATIVE

## 2023-05-25 MED ORDER — QUETIAPINE FUMARATE 300 MG PO TABS
400.0000 mg | ORAL_TABLET | Freq: Every day | ORAL | Status: DC
  Administered 2023-05-25: 400 mg via ORAL
  Filled 2023-05-25: qty 1

## 2023-05-25 MED ORDER — DIVALPROEX SODIUM ER 500 MG PO TB24
1000.0000 mg | ORAL_TABLET | Freq: Every day | ORAL | Status: DC
  Administered 2023-05-25: 1000 mg via ORAL
  Filled 2023-05-25: qty 2

## 2023-05-25 NOTE — ED Notes (Signed)
[facility] contacted - states to call back in morning for transport

## 2023-05-25 NOTE — ED Notes (Addendum)
Pt seems anxious looking around, RN gave  pt a icy he ate it, he's also talking to himself, and was hitting his forehead lightly with his palm on his forehead. pt currently laying in bed

## 2023-05-25 NOTE — ED Notes (Addendum)
Pt dressed out in burgundy scrubs, pt belongings bag is placed in cabinet 16-18 with his stickers on it.   Green Nike hoodie Gray and orange shoes  Dark gray shorts  [employer].   Pt also aware about urine sample.

## 2023-05-25 NOTE — Progress Notes (Signed)
Pt was accepted to [facility] TODAY 05/25/2023 PENDING Negative COVID faxed to [phone]   Pt meets inpatient criteria per Joaquim Nam  Attending Physician will be Einar Gip, MD  Report can be called to: - [phone]  Pt can arrive after: BED IS READY ONCE COVID HAS BEEN RECEIVED. Please call report when transport is present.  Care Team notified:Kyra Weber, NP, Brie Fox,Paramedic, Ophelia Shoulder, NP   Kelton Pillar, LCSWA 05/25/2023 @ 6:45 PM

## 2023-05-25 NOTE — Progress Notes (Signed)
LCSW Progress Note  [id number]   Albert [name]  05/25/2023  6:19 PM    Inpatient Behavioral Health Placement  Pt meets inpatient criteria per Phebe Colla, NP. There are no available beds within [facility] per [facility] [employer]. Referral was sent to the following facilities;   Destination  Service Provider Address Phone [facility]  [address] [place], [place] [address] [phone]  [facility]  [address] [place] [address] [phone]  [facility] [address]  [address], [place] [address] [phone]  [facility]  [address] [place] [address] [phone]  [facility]  [address] [place], [place] [address] [phone]  [facility]  [address] [place] [address] [phone]  [facility]  [address], [place] [address] [phone]  [facility]  [address] [place], [place] [address] [phone]  [facility]  [address], [place] [address] [phone]  [facility]  [address] [place] [address] [phone]  [facility]  [address], [place] [address] [phone]  [facility]  [address] [place] [address] [phone]  [facility]  [address] [place] [address] [phone]     Situation ongoing,  CSW will follow up.    Maryjean Ka, MSW, [facility] 05/25/2023 6:19 PM

## 2023-05-25 NOTE — Consult Note (Signed)
Telepsych Consultation   Reason for Consult:  Visual/auditory hallucinations Referring Physician:  Evlyn Kanner Location of Patient: [facility] Location of Provider: Other: Private Virtual Office  Patient Identification: Albert [name] MRN:  [id number] Principal Diagnosis: Paranoid schizophrenia (HCC) Diagnosis:  Principal Problem:   Paranoid schizophrenia (HCC)   Total Time spent with patient: 45 minutes  HPI:  Albert [name] is a 47 y.o. male brought in by self reporting he is hearing voices.  He has a history of schizophrenia, schizoaffective disorder, alcohol use, and adjustment disorder. Patient states over the past week he has not been able to sleep.  Subjective:   Albert [name] is a 47 y.o. male patient brought in by himself with increasing auditory hallucinations that are causing the patient to be afraid.    Patient is assessed via telehealth.  He is cooperative with assessment.  He is alert and oriented X4. Patient says "I came here because I need help." Patient denies suicidal ideation and homicidal ideation.  He endorses hallucinations.  He is paranoid and believes people in the [employer] store can hear his voices and don't like what they say.  Patient said the voices have gotten much worse in the last month.  He said the voices are telling him "you can't eat" and "you can't leave the house."  Patient also said he is starting to feel electrical sensors in his head go off.  The voices tole the patient "I had to have sexual pleasure with myself and I said I didn't want to."  Patient said "they started pinging my brain with the computer they have that I can't see and it made me be sexually excited and I had to get relief."  Patient shared that "the Fabio Pierce" one of his voices is going to "do riv you"  and "uncle Clayburn Pert" another voice calls the patient Truddie Hidden. Patient said he started calling the voice Dad.    The patient says he has been taking his  medication as prescribed and he is frightened of the voices because they are mean to him and are making him do things he doesn't want to.  Patient says he has not been using substances and use utox is clear.  Patient says he has been attending NA meetings, but the voices are now beginning to interrupt his meetings.  Patient is actively psychotic and in need of inpatient psychiatric hospitalization for stabilization.     Past Psychiatric History: Schizophrenia, schizoaffective disorder, alcohol use, and adjustment disorder.  He was previously hospitalized.  Risk to Self:  Yes Risk to Others: No  Prior Inpatient Therapy:  Yes Prior Outpatient Therapy:  Yes  Past Medical History:  Past Medical History:  Diagnosis Date   Alcoholism (HCC)    Bipolar disorder (HCC)    Drug abuse (HCC)    xanax addiction 5 years ago   Heartburn    Medical non-compliance    Paranoid schizophrenia (HCC)    Schizophrenia (HCC)    Seizures (HCC)     Past Surgical History:  Procedure Laterality Date   NO PAST SURGERIES     Family History:  Family History  Problem Relation Age of Onset   Thyroid disease Mother    Depression Maternal Aunt    Family Psychiatric  History: Patient unable to say Social History:  Social History   Substance and Sexual Activity  Alcohol Use Not Currently   Comment: has been sober since June 4th     Social History   Substance  and Sexual Activity  Drug Use Not Currently   Types: Marijuana, Benzodiazepines   Comment: THC: was smoking 1 ounce every day, last use: April & xanax use was 10 mg every day for "a few months"    Social History   Socioeconomic History   Marital status: Single    Spouse name: Not on file   Number of children: Not on file   Years of education: Not on file   Highest education level: Not on file  Occupational History    Comment: Unclear  Tobacco Use   Smoking status: Former    Current packs/day: 0.00    Types: Cigarettes    Quit date:  02/21/2022    Years since quitting: 1.2   Smokeless tobacco: Never   Tobacco comments:    He stopped smoking in April of 2023. He said that he used to smoke "a few packs per day."   Vaping Use   Vaping status: Never Used  Substance and Sexual Activity   Alcohol use: Not Currently    Comment: has been sober since June 4th   Drug use: Not Currently    Types: Marijuana, Benzodiazepines    Comment: THC: was smoking 1 ounce every day, last use: April & xanax use was 10 mg every day for "a few months"   Sexual activity: Not Currently  Other Topics Concern   Not on file  Social History Narrative   Pt reported that he lives in a house apartment.   Social Determinants of Health   Financial Resource Strain: Not on file  Food Insecurity: Food Insecurity Present (07/23/2022)   Hunger Vital Sign    Worried About Running Out of Food in the Last Year: Sometimes true    Ran Out of Food in the Last Year: Sometimes true  Transportation Needs: No Transportation Needs (07/23/2022)   PRAPARE - Administrator, Civil Service (Medical): No    Lack of Transportation (Non-Medical): No  Physical Activity: Not on file  Stress: Not on file  Social Connections: Not on file   Additional Social History: Patient has a twin sister that lives in town.    Allergies:  No Known Allergies  Labs:  Results for orders placed or performed during the hospital encounter of 05/25/23 (from the past 48 hour(s))  Rapid urine drug screen (hospital performed)     Status: None   Collection Time: 05/25/23  9:31 AM  Result Value Ref Range   Opiates NONE DETECTED NONE DETECTED   Cocaine NONE DETECTED NONE DETECTED   Benzodiazepines NONE DETECTED NONE DETECTED   Amphetamines NONE DETECTED NONE DETECTED   Tetrahydrocannabinol NONE DETECTED NONE DETECTED   Barbiturates NONE DETECTED NONE DETECTED    Comment: (NOTE) DRUG SCREEN FOR MEDICAL PURPOSES ONLY.  IF CONFIRMATION IS NEEDED FOR ANY PURPOSE, NOTIFY  LAB WITHIN 5 DAYS.  LOWEST DETECTABLE LIMITS FOR URINE DRUG SCREEN Drug Class                     Cutoff (ng/mL) Amphetamine and metabolites    1000 Barbiturate and metabolites    200 Benzodiazepine                 200 Opiates and metabolites        300 Cocaine and metabolites        300 THC  50 Performed at [facility], [address] [place], [place] [address]   CBC with Differential     Status: None   Collection Time: 05/25/23  9:40 AM  Result Value Ref Range   WBC 9.1 4.0 - 10.5 K/uL   RBC 4.72 4.22 - 5.81 MIL/uL   Hemoglobin 15.0 13.0 - 17.0 g/dL   HCT 60.4 54.0 - 98.1 %   MCV 92.8 80.0 - 100.0 fL   MCH 31.8 26.0 - 34.0 pg   MCHC 34.2 30.0 - 36.0 g/dL   RDW 19.1 47.8 - 29.5 %   Platelets 324 150 - 400 K/uL   nRBC 0.0 0.0 - 0.2 %   Neutrophils Relative % 56 %   Neutro Abs 5.1 1.7 - 7.7 K/uL   Lymphocytes Relative 36 %   Lymphs Abs 3.2 0.7 - 4.0 K/uL   Monocytes Relative 7 %   Monocytes Absolute 0.6 0.1 - 1.0 K/uL   Eosinophils Relative 0 %   Eosinophils Absolute 0.0 0.0 - 0.5 K/uL   Basophils Relative 1 %   Basophils Absolute 0.1 0.0 - 0.1 K/uL   Immature Granulocytes 0 %   Abs Immature Granulocytes 0.03 0.00 - 0.07 K/uL    Comment: Performed at [facility], [address], [place], [place] [address]  Comprehensive metabolic panel     Status: Abnormal   Collection Time: 05/25/23  9:40 AM  Result Value Ref Range   Sodium 136 135 - 145 mmol/L   Potassium 3.6 3.5 - 5.1 mmol/L   Chloride 102 98 - 111 mmol/L   CO2 21 (L) 22 - 32 mmol/L   Glucose, Bld 108 (H) 70 - 99 mg/dL    Comment: Glucose reference range applies only to samples taken after fasting for at least 8 hours.   BUN 7 6 - 20 mg/dL   Creatinine, Ser 8.65 0.61 - 1.24 mg/dL   Calcium 8.8 (L) 8.9 - 10.3 mg/dL   Total Protein 7.9 6.5 - 8.1 g/dL   Albumin 4.2 3.5 - 5.0 g/dL   AST 20 15 - 41 U/L   ALT 19 0 - 44 U/L   Alkaline Phosphatase 49  38 - 126 U/L   Total Bilirubin 0.8 0.3 - 1.2 mg/dL   GFR, Estimated >78 >46 mL/min    Comment: (NOTE) Calculated using the CKD-EPI Creatinine Equation (2021)    Anion gap 13 5 - 15    Comment: Performed at [facility], [address], [place], [place] [address]  Valproic acid level     Status: None   Collection Time: 05/25/23  9:40 AM  Result Value Ref Range   Valproic Acid Lvl 61 50.0 - 100.0 ug/mL    Comment: Performed at [facility], [address] [place], [place] [address]  Ethanol     Status: None   Collection Time: 05/25/23  9:40 AM  Result Value Ref Range   Alcohol, Ethyl (B) <10 <10 mg/dL    Comment: (NOTE) Lowest detectable limit for serum alcohol is 10 mg/dL.  For medical purposes only. Performed at [facility], [address] [place], [place] [address]   TSH     Status: None   Collection Time: 05/25/23 11:46 AM  Result Value Ref Range   TSH 2.109 0.350 - 4.500 uIU/mL    Comment: Performed by a 3rd Generation assay with a functional sensitivity of <=0.01 uIU/mL. Performed at [facility], [address], [place], [place] [address]     Medications:  No current facility-administered medications for this encounter.   Current Outpatient Medications  Medication Sig Dispense Refill   ADVIL 200 MG CAPS Take 200-400 mg by mouth every 6 (six) hours as needed (for headaches).     divalproex (DEPAKOTE ER) 500 MG 24 hr tablet Take 2 tablets (1,000 mg total) by mouth at bedtime. 60 tablet 0   omeprazole (PRILOSEC) 40 MG capsule Take 40 mg by mouth daily as needed (for heartburn or reflux).     QUEtiapine (SEROQUEL) 200 MG tablet Take 200-300 mg by mouth at bedtime.     triamcinolone cream (KENALOG) 0.1 % Apply 1 Application topically 2 (two) times daily as needed (for itching or irritation).     QUEtiapine (SEROQUEL) 300 MG tablet Take 1 tablet (300 mg total) by mouth at bedtime. (Patient not  taking: Reported on 05/25/2023) 30 tablet 0    Musculoskeletal: Strength & Muscle Tone: within normal limits Gait & Station: normal Patient leans: N/A          Psychiatric Specialty Exam:  Presentation  General Appearance:  Appropriate for Environment  Eye Contact: Good  Speech: Clear and Coherent  Speech Volume: Normal  Handedness: Right   Mood and Affect  Mood: Anxious  Affect: Congruent   Thought Process  Thought Processes: Disorganized  Descriptions of Associations:Tangential  Orientation:Full (Time, Place and Person)  Thought Content:Delusions  History of Schizophrenia/Schizoaffective disorder:Yes  Duration of Psychotic Symptoms:Greater than six months  Hallucinations:Hallucinations: Auditory; Command Description of Command Hallucinations: Patient said the voices tell him "you are not allowed to eat" or "you can't leave the house" Description of Auditory Hallucinations: Voices that say "mean things to me"  Ideas of Reference:Delusions (He thinks people at [employer] and [employer] can hear his voices.)  Suicidal Thoughts:Suicidal Thoughts: No  Homicidal Thoughts:Homicidal Thoughts: No   Sensorium  Memory: Immediate Good; Recent Good; Remote Good  Judgment: Intact  Insight: Poor   Executive Functions  Concentration: Fair  Attention Span: Fair  Recall: Good  Fund of Knowledge: Fair  Language: Fair   Psychomotor Activity  Psychomotor Activity: Psychomotor Activity: Normal   Assets  Assets: Communication Skills; Desire for Improvement; Housing; Leisure Time   Sleep  Sleep: Sleep: Poor    Physical Exam: Physical Exam Vitals reviewed.  Skin:    General: Skin is dry.  Neurological:     Mental Status: He is alert.    Review of Systems  Psychiatric/Behavioral:  Positive for hallucinations. The patient is nervous/anxious.   All other systems reviewed and are negative.  Blood pressure (!) 143/100, pulse (!)  106, temperature 98.5 F (36.9 C), temperature source Oral, resp. rate 16, height 5\' 10"  (1.778 m), weight 103 kg, SpO2 98%. Body mass index is 32.58 kg/m.  Treatment Plan Summary: Patient is actively psychotic and in need of inpatient psychiatric hospitalization for stabilization.    Increase Seroquel 400mg  PO Q HS Continue Depakote 1000mg  PO Q Day  Disposition: Recommend inpatient psychiatric hospitalization.  This service was provided via telemedicine using a 2-way, interactive audio and video technology.  Names of all persons participating in this telemedicine service and their role in this encounter. Name: [name] Role: Patient   Name: Everette Rank Role: DNP   Thomes Lolling, NP 05/25/2023 4:37 PM

## 2023-05-25 NOTE — ED Provider Notes (Signed)
[place] EMERGENCY DEPARTMENT AT [facility] Provider Note   CSN: [id number] Arrival date & time: 05/25/23  0865     History  Chief Complaint  Patient presents with   Hallucinations    Albert [name] is a 47 y.o. male history of schizophrenia, schizoaffective disorder, alcohol use, cannabis use, adjustment disorder presented for hallucinations.  Patient states over the past week he has not been sleeping but does not endorse this is being a manic episode as he has not been spending money, planning trips, or having racing thoughts.  Patient dates he has not slept due to anxiety keeping him up throughout the night.  Patient dates he has been compliant with his medications and has not missed any doses.  Patient does see psychiatry and has appointment on Thursday however does not feel that he can wait until then.  Patient denies any SI/HI.  Patient states that he hears voices when he is alone but is unable to say what the voices are telling him.  Patient also states that he sees "objects "but is unable to further describe these objects.  Patient denied chest pain, shortness of breath, abdominal pain, fevers, nausea/vomiting, change in sensation/motor skills, drug use  Home Medications Prior to Admission medications   Medication Sig Start Date End Date Taking? Authorizing Provider  divalproex (DEPAKOTE ER) 500 MG 24 hr tablet Take 2 tablets (1,000 mg total) by mouth at bedtime. 08/01/22   Sarita Bottom, MD  omeprazole (PRILOSEC OTC) 20 MG tablet Take 20 mg by mouth daily as needed (For heartburn or acid reflux).    [provider]  QUEtiapine (SEROQUEL) 300 MG tablet Take 1 tablet (300 mg total) by mouth at bedtime. 08/01/22   Sarita Bottom, MD      Allergies    Patient has no known allergies.    Review of Systems   Review of Systems  Physical Exam Updated Vital Signs BP (!) 143/100 (BP Location: Left Arm)   Pulse (!) 106   Temp 98.5 F (36.9 C) (Oral)    Resp 16   Ht 5\' 10"  (1.778 m)   Wt 103 kg   SpO2 98%   BMI 32.58 kg/m  Physical Exam Vitals reviewed.  Constitutional:      General: He is not in acute distress. HENT:     Head: Normocephalic and atraumatic.  Eyes:     Extraocular Movements: Extraocular movements intact.     Conjunctiva/sclera: Conjunctivae normal.     Pupils: Pupils are equal, round, and reactive to light.  Cardiovascular:     Rate and Rhythm: Regular rhythm. Tachycardia present.     Pulses: Normal pulses.     Heart sounds: Normal heart sounds.     Comments: 2+ bilateral radial/dorsalis pedis pulses with increased rate Pulmonary:     Effort: Pulmonary effort is normal. No respiratory distress.     Breath sounds: Normal breath sounds.  Abdominal:     Palpations: Abdomen is soft.     Tenderness: There is no abdominal tenderness. There is no guarding or rebound.  Musculoskeletal:        General: Normal range of motion.     Cervical back: Normal range of motion and neck supple.     Comments: 5 out of 5 bilateral grip/leg extension strength  Skin:    General: Skin is warm and dry.     Capillary Refill: Capillary refill takes less than 2 seconds.  Neurological:     General: No focal deficit present.  Mental Status: He is alert and oriented to person, place, and time.     Comments: Sensation intact in all 4 limbs  Psychiatric:        Mood and Affect: Mood normal.     Comments: Paranoid: Says people are out to get him Unable to describe his visual/auditory hallucinations, not actively having hallucinations     ED Results / Procedures / Treatments   Labs (all labs ordered are listed, but only abnormal results are displayed) Labs Reviewed  COMPREHENSIVE METABOLIC PANEL - Abnormal; Notable for the following components:      Result Value   CO2 21 (*)    Glucose, Bld 108 (*)    Calcium 8.8 (*)    All other components within normal limits  CBC WITH DIFFERENTIAL/PLATELET  VALPROIC ACID LEVEL  ETHANOL   RAPID URINE DRUG SCREEN, HOSP PERFORMED  TSH    EKG None  Radiology No results found.  Procedures Procedures    Medications Ordered in ED Medications - No data to display  ED Course/ Medical Decision Making/ A&P                             Medical Decision Making Amount and/or Complexity of Data Reviewed Labs: ordered.   [name] 47 y.o. presented today for hallucinations. Working DDx that I considered at this time includes, but not limited to schizophrenia, manic episode, drug-induced, anxiety, panic attack, hyperthyroid, electrolyte abnormalities, subtherapeutic medication.  R/o DDx: drug-induced, hyperthyroid, electrolyte abnormalities, subtherapeutic medication: These are considered less likely due to history of present illness and physical exam findings  Review of prior external notes: 07/23/2022 discharge  Unique Tests and My Interpretation:  Ethanol: Negative CBC: Unremarkable UDS: Unremarkable CMP: Unremarkable Valproic acid: Unremarkable [id number]  Discussion with Independent Historian: None  Discussion of Management of Tests: None  Risk: Low: based on diagnostic testing/clinical impression and treatment plan  Risk Stratification Score: None  Plan: On exam patient was tachycardic at 106 however was in no acute distress.  Patient was endorsing visual and auditory hallucinations but was unable to further describe these hallucinations.  Patient states has been on for a week however does not endorse activity that would be indicative of a manic episode but does state he has been anxious which has been causing him to stay awake.  Patient states he wants help and at this time is not endorsing any SI or HI requiring IVC.  Patient's labs from triage were ultimately unremarkable along with his physical exam however TSH is at his patient does have history of hypothyroid.  Patient does appear paranoid on exam.  TSH came back reassuring.  TTS will be  consulted.  Patient stable this time.         Final Clinical Impression(s) / ED Diagnoses Final diagnoses:  Hallucinations    Rx / DC Orders ED Discharge Orders     None         Remi Deter 05/25/23 1252    Bethann Berkshire, MD 05/29/23 1515

## 2023-05-25 NOTE — Progress Notes (Signed)
CSW spoke with Garald Braver, BSN, RN with [facility]; [email or web address] who informed pt is under review and would call back if pt could be accepted. CSW provided [facility] ED number to speak with assigned RN.    Maryjean Ka, MSW, [facility] 05/25/2023 6:31 PM

## 2023-05-25 NOTE — ED Notes (Signed)
[facility] contacted to inform for transport tomorrow after 9am via [employer].

## 2023-05-25 NOTE — ED Triage Notes (Signed)
Pt arrived to ED reporting he is hearing voicing. States he has felt like this for a few weeks and its getting worse. Endorse taking meds, has not taken his Depakote. Patient appears anxious and paranoid. AAOx4. NAD in triage. Denies SI at this time. Denies HI at this time.

## 2023-05-26 NOTE — ED Notes (Signed)
Patient resting in bed.

## 2023-06-06 IMAGING — DX DG CHEST 2V
3 series · 3 of 3 positions shown · non-contrast
Comparison: 01/22/19

CLINICAL DATA: Chest pain with deep inspiration for 1 month.

EXAM:
CHEST - 2 VIEW

[chest pa (1 of 2)]
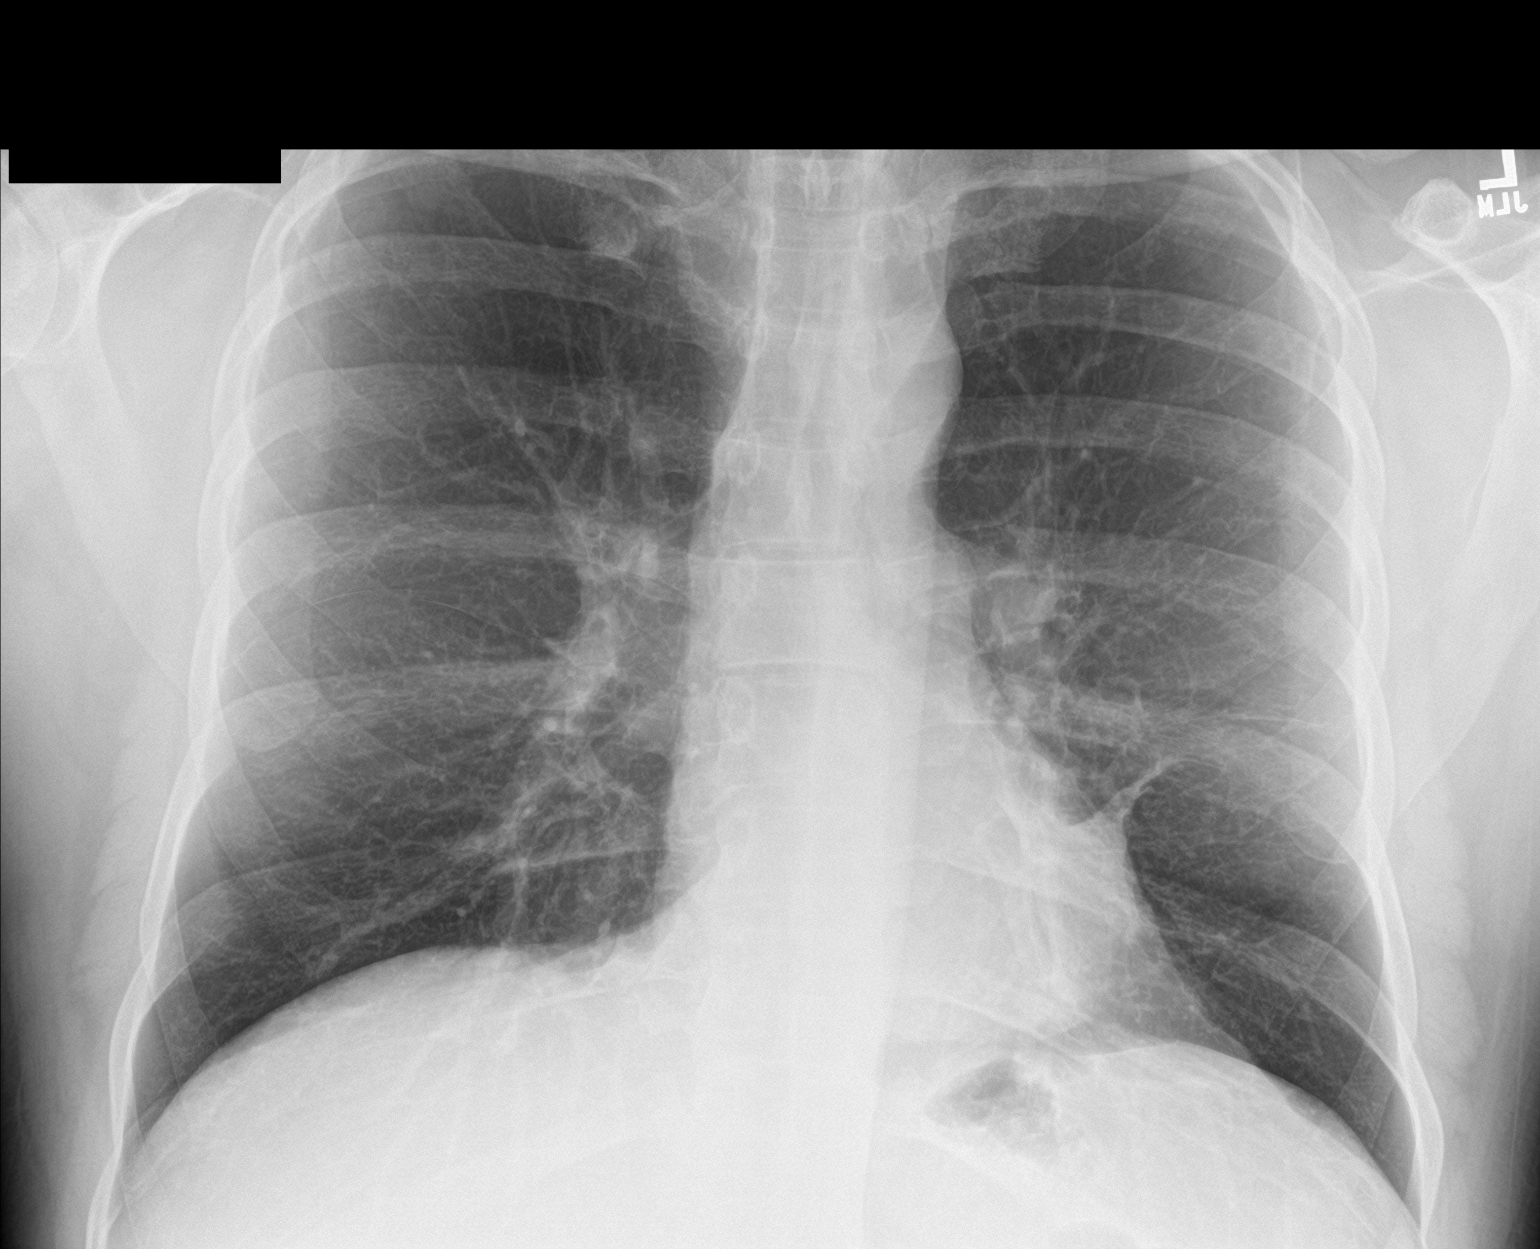

[chest pa (2 of 2)]
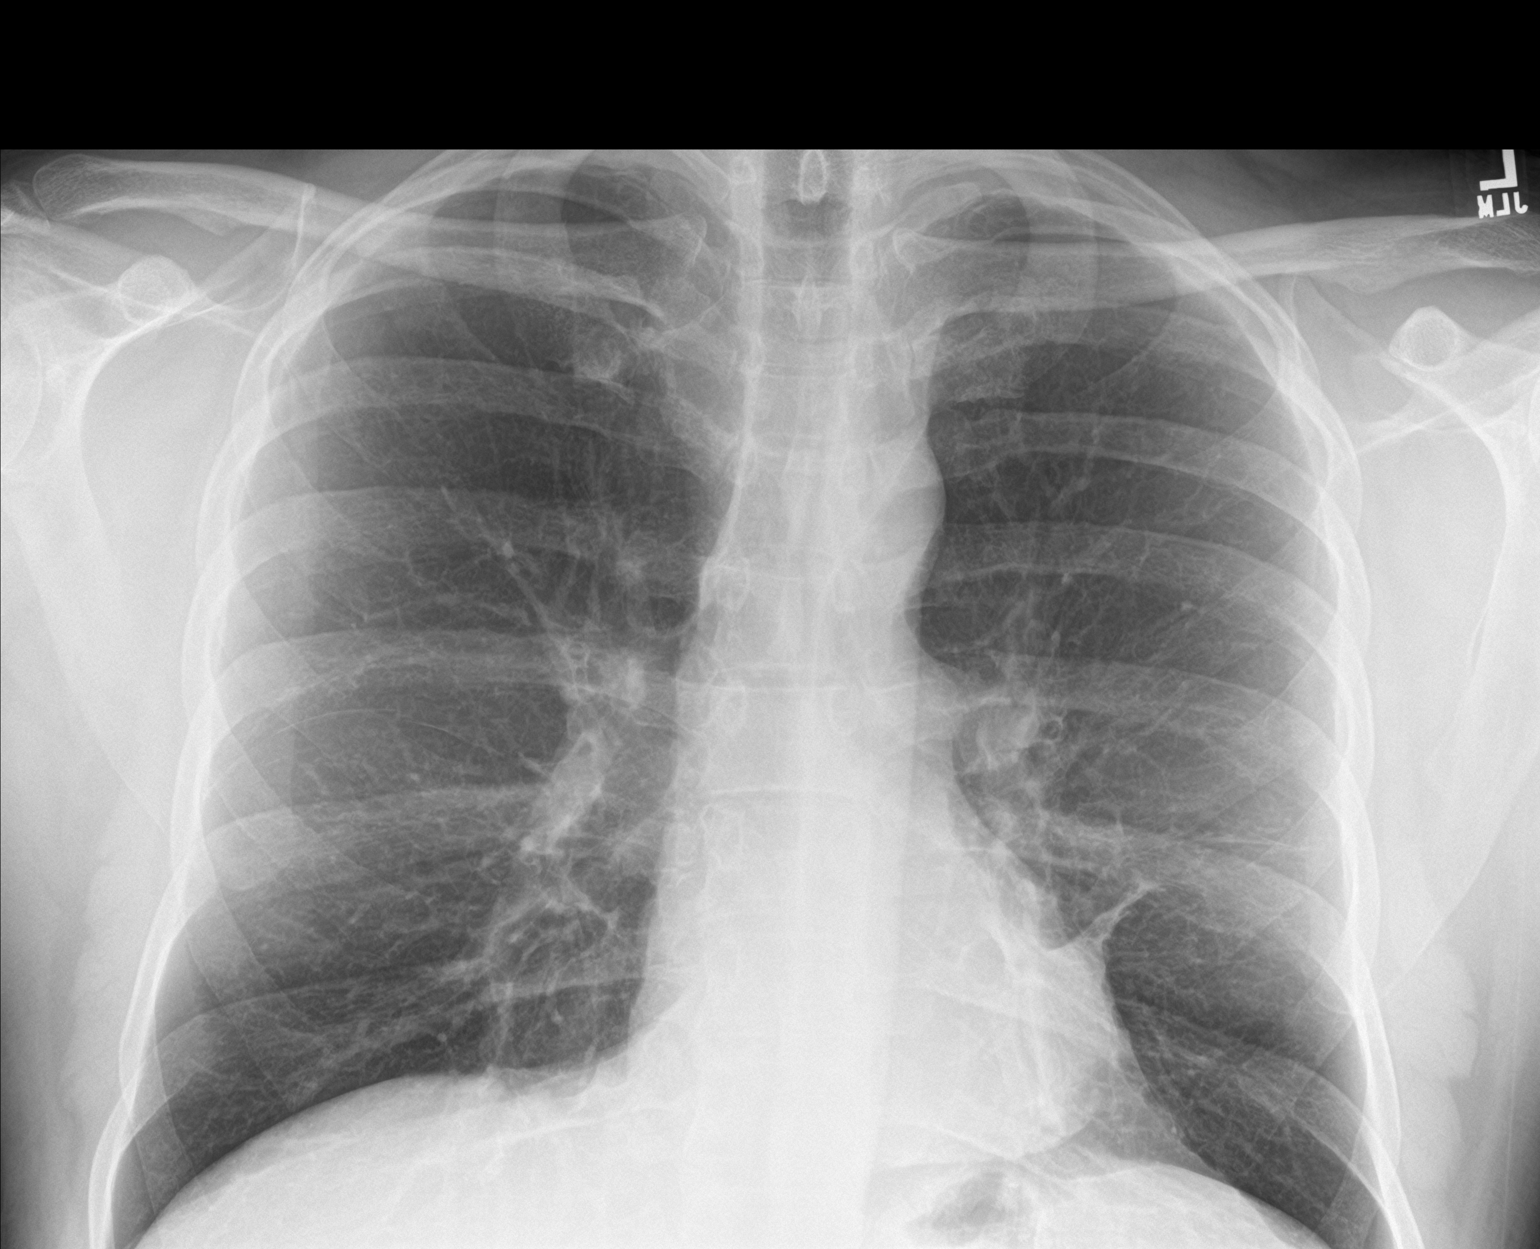

[chest lat]
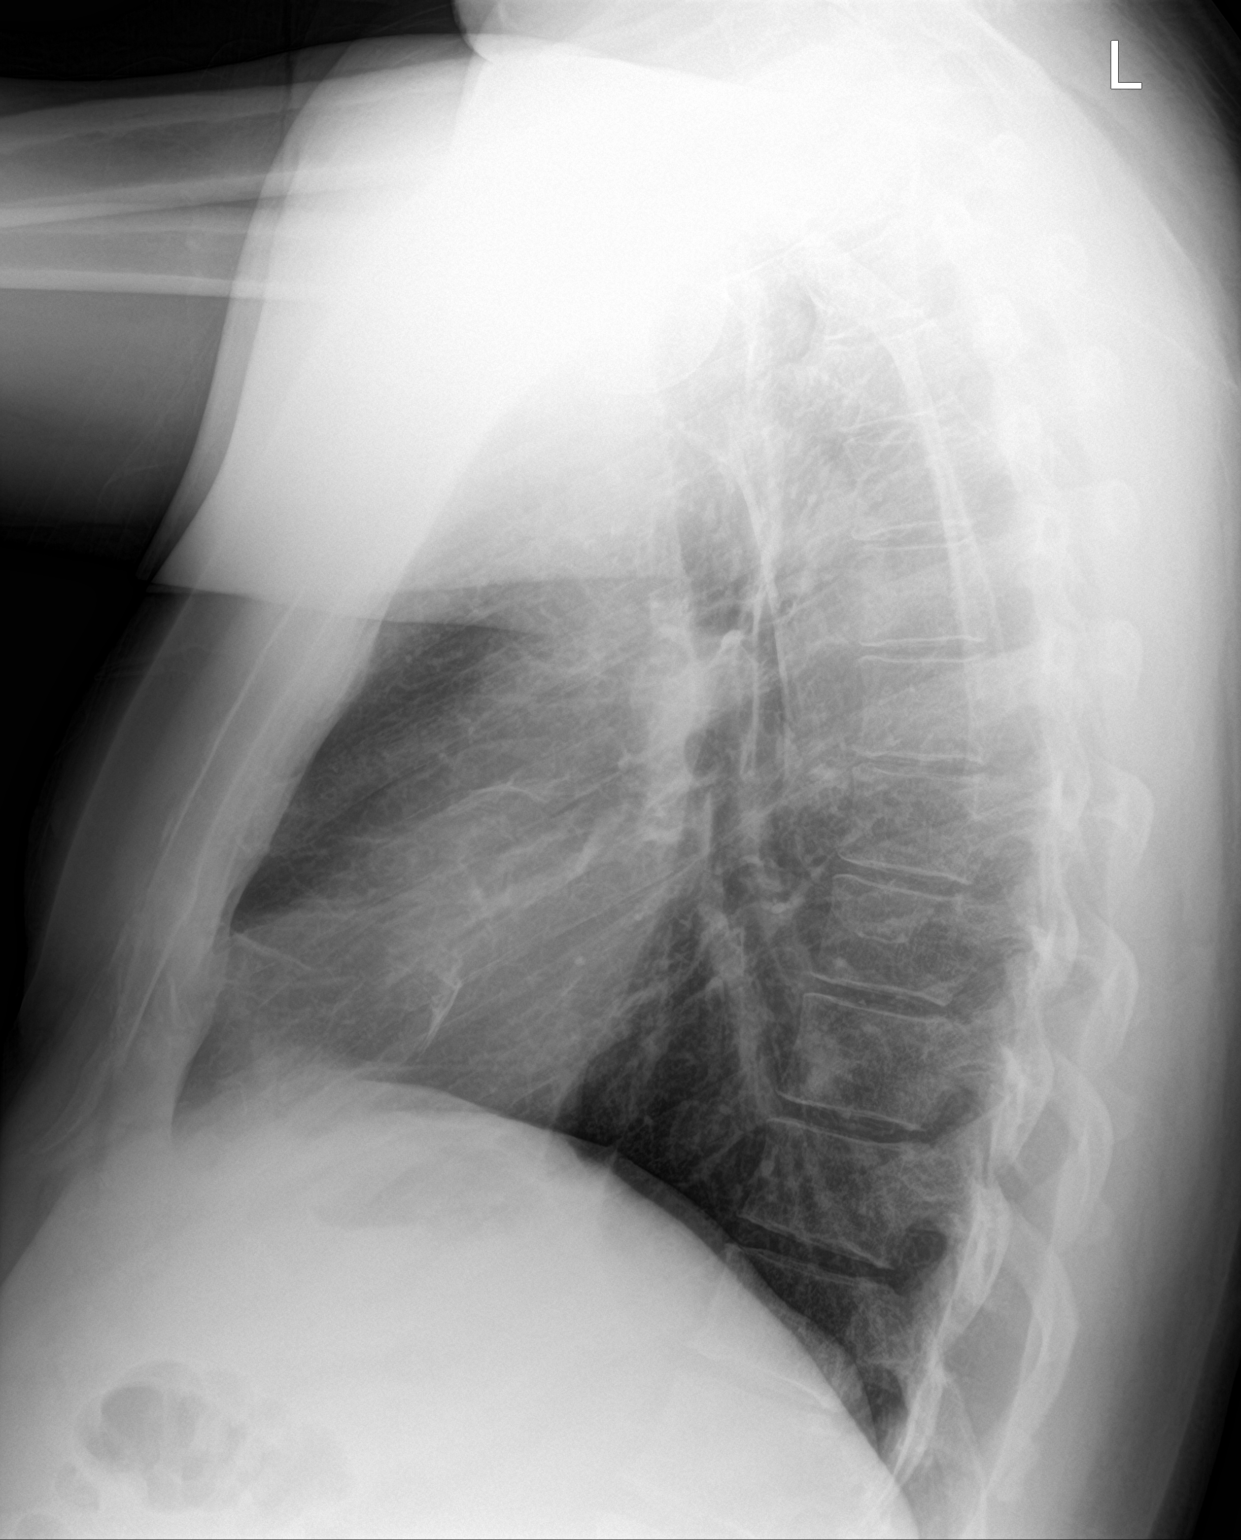

[3 of 3 positions shown; findings below may reference images not displayed]

FINDINGS: Normal heart size. No pleural effusion or edema. Scarring is
identified within the left lower lobe along the left heart border.
No superimposed airspace consolidation. The visualized osseous
structures are unremarkable.
IMPRESSION: 1. No acute findings.
2. Left lower lobe scarring.

## 2023-07-05 ENCOUNTER — Other Ambulatory Visit: Payer: Self-pay

## 2023-07-05 ENCOUNTER — Encounter (HOSPITAL_COMMUNITY): Payer: Self-pay | Admitting: Emergency Medicine

## 2023-07-05 ENCOUNTER — Emergency Department (HOSPITAL_COMMUNITY)
Admission: EM | Admit: 2023-07-05 | Discharge: 2023-07-05 | Disposition: A | Discharge status: Home / Self Care | Payer: 59 | Attending: Emergency Medicine | Admitting: Emergency Medicine

## 2023-07-05 DIAGNOSIS — Z7689 Persons encountering health services in other specified circumstances: Secondary | ICD-10-CM

## 2023-07-05 DIAGNOSIS — Z760 Encounter for issue of repeat prescription: Secondary | ICD-10-CM | POA: Insufficient documentation

## 2023-07-05 NOTE — ED Provider Notes (Signed)
[place] EMERGENCY DEPARTMENT AT [facility] Provider Note   CSN: [id number] Arrival date & time: 07/05/23  4098     History  Chief Complaint  Patient presents with   Medication Problem    Albert [name] is a 47 y.o. male with PMHx bipolar disorder, substance abuse, paranoid schizophrenia, seizures who presents to ED concerned with medication problem. Patient went to his pharmacy to pick up his prescriptions. Patient stating that the Seroquel stated "DAW 2.67 refills" which was different than usual. Patient also concerned because his Depakote pills were pink instead of white. Pharmacist was able to explain the differences to the patient but patient still unsure.  Patient denying any infectious symptoms today. Denies SI/HI and hallucinations.   HPI     Home Medications Prior to Admission medications   Medication Sig Start Date End Date Taking? Authorizing Provider  ADVIL 200 MG CAPS Take 200-400 mg by mouth every 6 (six) hours as needed (for headaches).    [provider]  divalproex (DEPAKOTE ER) 250 MG 24 hr tablet Take 250 mg by mouth daily.    [provider]  divalproex (DEPAKOTE ER) 500 MG 24 hr tablet Take 2 tablets (1,000 mg total) by mouth at bedtime. 08/01/22   Sarita Bottom, MD  omeprazole (PRILOSEC) 40 MG capsule Take 40 mg by mouth daily as needed (for heartburn or reflux).    [provider]  QUEtiapine (SEROQUEL) 100 MG tablet Take 100 mg by mouth at bedtime.    [provider]  QUEtiapine (SEROQUEL) 200 MG tablet Take 200-300 mg by mouth at bedtime.    [provider]  QUEtiapine (SEROQUEL) 300 MG tablet Take 1 tablet (300 mg total) by mouth at bedtime. Patient not taking: Reported on 05/25/2023 08/01/22   Sarita Bottom, MD  triamcinolone cream (KENALOG) 0.1 % Apply 1 Application topically 2 (two) times daily as needed (for itching or irritation).    [provider]      Allergies    Patient has  no known allergies.    Review of Systems   Review of Systems  Neurological:        Medication concern    Physical Exam Updated Vital Signs BP (!) 146/88 (BP Location: Left Arm)   Pulse 72   Temp 97.6 F (36.4 C) (Oral)   Resp 16   SpO2 98%  Physical Exam Vitals and nursing note reviewed.  Constitutional:      General: He is not in acute distress.    Appearance: He is not ill-appearing or toxic-appearing.  HENT:     Head: Normocephalic and atraumatic.  Eyes:     General: No scleral icterus.       Right eye: No discharge.        Left eye: No discharge.     Conjunctiva/sclera: Conjunctivae normal.  Cardiovascular:     Rate and Rhythm: Normal rate.  Pulmonary:     Effort: Pulmonary effort is normal.  Abdominal:     General: Abdomen is flat.  Skin:    General: Skin is warm and dry.  Neurological:     General: No focal deficit present.     Mental Status: He is alert. Mental status is at baseline.  Psychiatric:        Mood and Affect: Mood normal.        Behavior: Behavior normal.     Comments: Appropriate in affect. Overall well appearing with some anxiety.     ED Results /  Procedures / Treatments   Labs (all labs ordered are listed, but only abnormal results are displayed) Labs Reviewed - No data to display  EKG None  Radiology No results found.  Procedures Procedures    Medications Ordered in ED Medications - No data to display  ED Course/ Medical Decision Making/ A&P                                 Medical Decision Making  This patient presents to the ED for concern of medication concern   Co morbidities that complicate the patient evaluation  bipolar disorder, substance abuse, paranoid schizophrenia, seizure   Problem List / ED Course / Critical interventions / Medication management  Patient presents to ED concerned with his new medications that he just picked up today.  Patient concerned because the prescription and pills look a little  different than normal.  Pharmacist was able to explain these changes to the patient but he was still concerned.  Patient has not taken this medicine yet.  Patient denying any infectious symptoms at this time - so I did not believe lab workup was necessary.  Patient agreed to withhold labs at this time. I recommended patient to take his medication to a different pharmacist for confirmation of proper prescription. Patient verbalized understanding of plan. Patient denying any concerning infectious or mental symptoms today. Also recommended patient follow up with PCP. Patient stating that he is ready for discharge. I have reviewed the patients home medicines and have made adjustments as needed   Social Determinants of Health:  none           Final Clinical Impression(s) / ED Diagnoses Final diagnoses:  Encounter for new medication prescription    Rx / DC Orders ED Discharge Orders     None         Dorthy Cooler, [place] 07/05/23 1249    Derwood Kaplan, MD 07/06/23 [phone]

## 2023-07-05 NOTE — Discharge Instructions (Signed)
It was a pleasure caring for you today. Seek emergency care if experiencing any new or worsening symptoms.

## 2023-07-05 NOTE — ED Triage Notes (Signed)
Pt reports when he picked up his prescription for depakote his pills were a different color and "somebody told me not to take them." Pt also reports his Seroquel prescription "was written different." Pt was able to get both prescriptions filled.

## 2023-09-09 ENCOUNTER — Other Ambulatory Visit: Payer: Self-pay

## 2023-09-09 ENCOUNTER — Ambulatory Visit (HOSPITAL_COMMUNITY)
Admission: EM | Admit: 2023-09-09 | Discharge: 2023-09-09 | Disposition: A | Discharge status: Other Acute Inpatient Hospital | Payer: 59 | Source: Home / Self Care

## 2023-09-09 ENCOUNTER — Encounter (HOSPITAL_COMMUNITY): Payer: Self-pay

## 2023-09-09 ENCOUNTER — Emergency Department (HOSPITAL_COMMUNITY)
Admission: EM | Admit: 2023-09-09 | Discharge: 2023-09-10 | Disposition: A | Discharge status: Home / Self Care | Payer: 59 | Attending: Emergency Medicine | Admitting: Emergency Medicine

## 2023-09-09 DIAGNOSIS — F250 Schizoaffective disorder, bipolar type: Secondary | ICD-10-CM

## 2023-09-09 DIAGNOSIS — Z79899 Other long term (current) drug therapy: Secondary | ICD-10-CM | POA: Insufficient documentation

## 2023-09-09 LAB — CBC
HCT: 45.6 % (ref 39.0–52.0)
Hemoglobin: 15.2 g/dL (ref 13.0–17.0)
MCH: 30.8 pg (ref 26.0–34.0)
MCHC: 33.3 g/dL (ref 30.0–36.0)
MCV: 92.5 fL (ref 80.0–100.0)
Platelets: 316 10*3/uL (ref 150–400)
RBC: 4.93 MIL/uL (ref 4.22–5.81)
RDW: 12.8 % (ref 11.5–15.5)
WBC: 7.8 10*3/uL (ref 4.0–10.5)
nRBC: 0 % (ref 0.0–0.2)

## 2023-09-09 LAB — ETHANOL: Alcohol, Ethyl (B): <10 mg/dL (ref <10)

## 2023-09-09 LAB — RAPID URINE DRUG SCREEN, HOSP PERFORMED
Amphetamines: NOT DETECTED
Barbiturates: NOT DETECTED
Benzodiazepines: NOT DETECTED
Cocaine: NOT DETECTED
Opiates: NOT DETECTED
Tetrahydrocannabinol: NOT DETECTED

## 2023-09-09 LAB — COMPREHENSIVE METABOLIC PANEL
ALT: 22 U/L (ref 0–44)
AST: 21 U/L (ref 15–41)
Albumin: 4 g/dL (ref 3.5–5.0)
Alkaline Phosphatase: 63 U/L (ref 38–126)
Anion gap: 9 (ref 5–15)
BUN: 10 mg/dL (ref 6–20)
CO2: 21 mmol/L — ABNORMAL LOW (ref 22–32)
Calcium: 9.3 mg/dL (ref 8.9–10.3)
Chloride: 106 mmol/L (ref 98–111)
Creatinine, Ser: 0.97 mg/dL (ref 0.61–1.24)
GFR, Estimated: >60 mL/min (ref >60)
Glucose, Bld: 101 mg/dL — ABNORMAL HIGH (ref 70–99)
Potassium: 3.9 mmol/L (ref 3.5–5.1)
Sodium: 136 mmol/L (ref 135–145)
Total Bilirubin: 0.7 mg/dL (ref <1.2)
Total Protein: 7.2 g/dL (ref 6.5–8.1)

## 2023-09-09 LAB — VALPROIC ACID LEVEL: Valproic Acid Lvl: <10 ug/mL — ABNORMAL LOW (ref 50.0–100.0)

## 2023-09-09 LAB — ACETAMINOPHEN LEVEL: Acetaminophen (Tylenol), Serum: <10 ug/mL — ABNORMAL LOW (ref 10–30)

## 2023-09-09 LAB — SALICYLATE LEVEL: Salicylate Lvl: <7 mg/dL — ABNORMAL LOW (ref 7.0–30.0)

## 2023-09-09 MED ORDER — PANTOPRAZOLE SODIUM 40 MG PO TBEC
40.0000 mg | DELAYED_RELEASE_TABLET | Freq: Every day | ORAL | Status: DC
  Filled 2023-09-09: qty 1

## 2023-09-09 MED ORDER — HALOPERIDOL LACTATE 5 MG/ML IJ SOLN: 5.0000 mg | Freq: Four times a day (QID) | INTRAMUSCULAR | Status: DC | PRN

## 2023-09-09 MED ORDER — QUETIAPINE FUMARATE 100 MG PO TABS: 100.0000 mg | ORAL_TABLET | Freq: Every day | ORAL | Status: DC

## 2023-09-09 MED ORDER — HALOPERIDOL 5 MG PO TABS: 5.0000 mg | ORAL_TABLET | Freq: Four times a day (QID) | ORAL | Status: DC | PRN

## 2023-09-09 MED ORDER — LORAZEPAM 2 MG/ML IJ SOLN: 2.0000 mg | Freq: Four times a day (QID) | INTRAMUSCULAR | Status: DC | PRN

## 2023-09-09 MED ORDER — OLANZAPINE 5 MG PO TABS
5.0000 mg | ORAL_TABLET | Freq: Once | ORAL | Status: DC
  Filled 2023-09-09: qty 1

## 2023-09-09 MED ORDER — LORAZEPAM 1 MG PO TABS: 1.0000 mg | ORAL_TABLET | Freq: Once | ORAL | Status: DC

## 2023-09-09 MED ORDER — LORAZEPAM 1 MG PO TABS: 2.0000 mg | ORAL_TABLET | Freq: Four times a day (QID) | ORAL | Status: DC | PRN

## 2023-09-09 MED ORDER — ONDANSETRON HCL 4 MG PO TABS: 4.0000 mg | ORAL_TABLET | Freq: Three times a day (TID) | ORAL | Status: DC | PRN

## 2023-09-09 MED ORDER — OLANZAPINE 5 MG PO TBDP
5.0000 mg | ORAL_TABLET | Freq: Two times a day (BID) | ORAL | Status: DC
  Filled 2023-09-09: qty 1

## 2023-09-09 NOTE — BH Assessment (Signed)
Comprehensive Clinical Assessment (CCA) Note  09/09/2023 Lovelace Knuteson [id number]  DISPOSITION: Per Vernard Gambles NP pt is recommended for Inpatient psychiatric treatment  The patient demonstrates the following risk factors for suicide: Chronic risk factors for suicide include: psychiatric disorder of schizoaffective d/o and substance use disorder. Acute risk factors for suicide include: social withdrawal/isolation. Protective factors for this patient include: hope for the future. Considering these factors, the overall suicide risk at this point appears to be low. Patient is appropriate for outpatient follow up.   Per Triage assessment: "Pt presents to [facility] voluntarily unaccompanied. Pt states that he is wondering if we could help someone with disorganized thoughts. Pt denies SI, HI, AVH and alcohol/drugs. Pt states that he is feeling dizzy from the thoughts in his head. Pt doesn't have a therapist or psychiatrist."  With further assessment: Pt was alert and seemed oriented x 5. Pt seemed to be responding to internal stimuli as he would pause for periods of time before answering questions, look for periods of time in directions as if seeing someone od something, make non-sensical motions with his arms and laugh inappropriately. Pt was somewhat disorganized with some thought blocking and tangential expression of thoughts. Per pt's chart he has a hx of schizoaffective d/o. Per pt he has not been compliant with his prescribed medications for at least a few months. Pt was seeing Dr. Abbey Chatters for medication but stated he has not seen him since the summer. Pt denied having an OP therapist currently. Pt denied SI, HI and NSSH. Pt displayed some mood liability. Pt was mostly calm during the assessment but, became loud and somewhat aggressive in manner when asked if he was experiencing AVH. Pt was able to calm himself when redirected and the subject was changed. Per pt chart, pt has a hx of IP psychiatric  admissions. There were some questions pt was not able or willing to answer due to his psychotic symptoms.   Pt stated that he is unemployed, rides a scooter for transportation and has family in the area. Pt stated that his family includes his father and stepmother and adult several sisters. Pt stated that he lives alone in an apartment.    Chief Complaint:  Chief Complaint  Patient presents with   Evaluation   Visit Diagnosis:  Schizoaffective d/o    CCA Screening, Triage and Referral (STR)  Patient Reported Information How did you hear about [place]? Self  What Is the Reason for Your Visit/Call Today? Pt presents to [facility] voluntarily unaccompanied. Pt states that he is wondering if we could help someone with disorganized thoughts. Pt denies SI, HI, AVH and alcohol/drugs. Pt states that he is feeling dizzy from the thoughts in his head. Pt doesn't have a therapist or psychiatrist.  How Long Has This Been Causing You Problems? 1-6 months  What Do You Feel Would Help You the Most Today? Social Support   Have You Recently Had Any Thoughts About Hurting Yourself? No  Are You Planning to Commit Suicide/Harm Yourself At This time? No   Flowsheet Row ED from 09/09/2023 in [facility] ED from 07/05/2023 in [facility] Emergency Department at [facility] ED from 05/25/2023 in [facility] Emergency Department at [facility]  C-SSRS RISK CATEGORY No Risk No Risk No Risk       Have you Recently Had Thoughts About Hurting Someone [name]? No  Are You Planning to Harm Someone at This Time? No  Explanation: na  Have You Used  Any Alcohol or Drugs in the Past 24 Hours? No  What Did You Use and How Much? na  Do You Currently Have a Therapist/Psychiatrist? No  Name of Therapist/Psychiatrist: Name of Therapist/Psychiatrist: Pt stated he was seeing Dr. Abbey Chatters but has not seen him in several months and is no longer somplaint with his prescribed  medications.   Have You Been Recently Discharged From Any Office Practice or Programs? No  Explanation of Discharge From Practice/Program: na     CCA Screening Triage Referral Assessment Type of Contact: Face-to-Face  Telemedicine Service Delivery:   Is this Initial or Reassessment?   Date Telepsych consult ordered in [facility]:    Time Telepsych consult ordered in [facility]:    Location of Assessment: [facility] Assessment Services  Provider Location: [facility]   Collateral Involvement: none wllowed   Does Patient Have a Automotive engineer Guardian? No  Legal Guardian Contact Information: na  Copy of Legal Guardianship Form: No - copy requested  Legal Guardian Notified of Arrival: -- (na)  Legal Guardian Notified of Pending Discharge: -- (na)  If Minor and Not Living with Parent(s), Who has Custody? adult  Is CPS involved or ever been involved? -- (none reported)  Is APS involved or ever been involved? -- (none reported)   Patient Determined To Be At Risk for Harm To Self or Others Based on Review of Patient Reported Information or Presenting Complaint? No  Method: No Plan  Availability of Means: No access or NA  Intent: Vague intent or NA  Notification Required: No need or identified person  Additional Information for Danger to Others Potential: Active psychosis  Additional Comments for Danger to Others Potential: na  Are There Guns or Other Weapons in Your Home? No (per chart- Pt was unable or unwilling to answer questions at times due to psychotic symptoms.)  Types of Guns/Weapons: na  Are These Weapons Safely Secured?                            -- (na)  Who Could Verify You Are Able To Have These Secured: na  Do You Have any Outstanding Charges, Pending Court Dates, Parole/Probation? Pt was unable or unwilling to answer questions at times due to psychotic symptoms.  Contacted To Inform of Risk of Harm To Self or Others: na   Does Patient  Present under Involuntary Commitment? No    [place] of Residence: [place]   Patient Currently Receiving the Following Services: Not Receiving Services   Determination of Need: Emergent (2 hours) (Per Vernard Gambles NP pt is recommended for Inpatient psychiatric treatment)   Options For Referral: Inpatient Hospitalization     CCA Biopsychosocial Patient Reported Schizophrenia/Schizoaffective Diagnosis in Past: Yes (Schizoaffective per chart)   Strengths: able to accept help  Mental Health Symptoms Depression:   Change in energy/activity; Irritability   Duration of Depressive symptoms:  Duration of Depressive Symptoms: Greater than two weeks   Mania:   Change in energy/activity; Irritability   Anxiety:    None   Psychosis:   Affective flattening/alogia/avolition   Duration of Psychotic symptoms:  Duration of Psychotic Symptoms: Greater than six months   Trauma:   None   Obsessions:   None   Compulsions:   None   Inattention:   N/A   Hyperactivity/Impulsivity:   N/A   Oppositional/Defiant Behaviors:   N/A   Emotional Irregularity:   None   Other Mood/Personality Symptoms:  na    Mental Status Exam Appearance and self-care  Stature:   Average   Weight:   Average weight   Clothing:   Neat/clean   Grooming:   Normal   Cosmetic use:   None   Posture/gait:   Normal   Motor activity:   Not Remarkable   Sensorium  Attention:   Distractible   Concentration:   Anxiety interferes   Orientation:   X5   Recall/memory:   Normal   Affect and Mood  Affect:   Appropriate   Mood:   Anxious   Relating  Eye contact:   Normal   Facial expression:   Anxious   Attitude toward examiner:   Cooperative   Thought and Language  Speech flow:  Normal   Thought content:   Appropriate to Mood and Circumstances   Preoccupation:   -- (NA)   Hallucinations:   None (Pt was observed to seem to be responding to internal  stimuli)   Organization:   Circumstantial   Company secretary of Knowledge:   Fair   Intelligence:   Average   Abstraction:   Functional   Judgement:   Impaired   Reality Testing:   Distorted; Adequate   Insight:   Fair; Gaps   Decision Making:   Confused   Social Functioning  Social Maturity:   -- (Pt was unable or unwilling to answer questions at times due to psychotic symptoms.)   Social Judgement:   -- (Pt was unable or unwilling to answer questions at times due to psychotic symptoms.)   Stress  Stressors:   Family conflict   Coping Ability:   Overwhelmed   Skill Deficits:   Activities of daily living; Decision making; Responsibility; Self-care   Supports:   Family; Support needed     Religion: Religion/Spirituality Are You A Religious Person?: No  Leisure/Recreation: Leisure / Recreation Do You Have Hobbies?: Yes Leisure and Hobbies: Getting coffee, AA meetings, reading, journaling  Exercise/Diet: Exercise/Diet Do You Exercise?: No Have You Gained or Lost A Significant Amount of Weight in the Past Six Months?: No Do You Follow a Special Diet?: No Do You Have Any Trouble Sleeping?: No   CCA Employment/Education Employment/Work Situation: Employment / Work Systems developer: On disability 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?: No  Education: Education Is Patient Currently Attending School?: No Last Grade Completed:  (Pt was unable or unwilling to answer questions at times due to psychotic symptoms.) Did You Attend College?:  (Pt was unable or unwilling to answer questions at times due to psychotic symptoms.) Did You Have An Individualized Education Program (IIEP):  (Pt was unable or unwilling to answer questions at times due to psychotic symptoms.) Did You  Have Any Difficulty At School?:  (Pt was unable or unwilling to answer questions at times due to psychotic symptoms.) Patient's Education Has Been Impacted by Current Illness: No   CCA Family/Childhood History Family and Relationship History: Family history Marital status: Single Does patient have children?: No  Childhood History:  Childhood History By whom was/is the patient raised?: Mother Did patient suffer any verbal/emotional/physical/sexual abuse as a child?: No Has patient ever been sexually abused/assaulted/raped as an adolescent or adult?: No Witnessed domestic violence?: No Has patient been affected by domestic violence as an adult?: No  CCA Substance Use Alcohol/Drug Use: Alcohol / Drug Use Pain Medications: See MAR Prescriptions: See MAR Over the Counter: See MAR History of alcohol / drug use?: Yes (Hx of polysubstance use per chart- Pt was unable or unwilling to answer questions at times due to psychotic symptoms.) Longest period of sobriety (when/how long): Current 4 years Negative Consequences of Use:  (Pt was unable or unwilling to answer questions at times due to psychotic symptoms.) Withdrawal Symptoms:  (Pt was unable or unwilling to answer questions at times due to psychotic symptoms.)                         ASAM's:  Six Dimensions of Multidimensional Assessment  Dimension 1:  Acute Intoxication and/or Withdrawal Potential:      Dimension 2:  Biomedical Conditions and Complications:      Dimension 3:  Emotional, Behavioral, or Cognitive Conditions and Complications:     Dimension 4:  Readiness to Change:     Dimension 5:  Relapse, Continued use, or Continued Problem Potential:     Dimension 6:  Recovery/Living Environment:     ASAM Severity Score:    ASAM Recommended Level of Treatment:     Substance use Disorder (SUD)    Recommendations for Services/Supports/Treatments:    Discharge Disposition:    DSM5 Diagnoses: Patient  Active Problem List   Diagnosis Date Noted   Adjustment disorder with disturbance of emotion 01/11/2020   Schizophrenia (HCC) 09/01/2019   Schizoaffective disorder (HCC) 01/23/2019   Paranoid schizophrenia (HCC) 06/05/2018   Alcohol use disorder, moderate, in sustained remission (HCC) 07/09/2017   Cannabis use disorder, moderate, in sustained remission (HCC) 07/09/2017   Elevated TSH 01/05/2017   Tachycardia 01/05/2017   Schizoaffective disorder, bipolar type (HCC) 01/04/2017   Alcoholism (HCC)    Drug abuse (HCC)    Medical non-compliance      Referrals to Alternative Service(s): Referred to Alternative Service(s):   Place:   Date:   Time:    Referred to Alternative Service(s):   Place:   Date:   Time:    Referred to Alternative Service(s):   Place:   Date:   Time:    Referred to Alternative Service(s):   Place:   Date:   Time:     Argel Pablo T, Counselor

## 2023-09-09 NOTE — Discharge Instructions (Addendum)
The continuous assessment unit is currently at capacity.  Patient will be transferred to [facility] ED Dr. Wallace Cullens is the accepting MD while awaiting inpatient psychiatric bed availability.

## 2023-09-09 NOTE — ED Notes (Signed)
Rn gave report to jamie   rn ed . Notified her that patient is being ivc'd

## 2023-09-09 NOTE — ED Notes (Signed)
Rn notified provider that patient refused his medication.

## 2023-09-09 NOTE — Progress Notes (Signed)
   09/09/23 0712  [facility] Triage Screening (Walk-ins at [facility] only)  How Did You Hear About [place]? Self  What Is the Reason for Your Visit/Call Today? Pt presents to [facility] voluntarily unaccompanied. Pt states that he is wondering if we could help someone with disorganized thoughts. Pt denies SI, HI, AVH and alcohol/drugs. Pt states that he is feeling dizzy from the thoughts in his head. Pt doesn't have a therapist or psychiatrist.  How Long Has This Been Causing You Problems? 1-6 months  Have You Recently Had Any Thoughts About Hurting Yourself? No  Are You Planning to Commit Suicide/Harm Yourself At This time? No  Have you Recently Had Thoughts About Hurting Someone [name]? No  Are You Planning To Harm Someone At This Time? No  Are you currently experiencing any auditory, visual or other hallucinations? No  Have You Used Any Alcohol or Drugs in the Past 24 Hours? No  Do you have any current medical co-morbidities that require immediate attention? No  Clinician description of patient physical appearance/behavior: tapping feet, thought blocking, staring into space  What Do You Feel Would Help You the Most Today? Social Support  If access to [facility] was not available, would you have sought care in the Emergency Department? No  Determination of Need Routine (7 days)  Options For Referral Medication Management;Outpatient Therapy;Inpatient Hospitalization

## 2023-09-09 NOTE — ED Triage Notes (Signed)
Pt coming in from [facility]. Pt is under IVC.

## 2023-09-09 NOTE — ED Notes (Signed)
Patient came in IVC'd. 3 copies attached to clipboard in blue zone, 1 copy in medical records and 1 copy in red folder. Case number: [id number] EXP: 09-16-23

## 2023-09-09 NOTE — ED Notes (Signed)
Patient served ivc and taken to [facility]

## 2023-09-09 NOTE — Progress Notes (Signed)
Pt was accepted to [facility] 09/10/2023; Bed Assignment [facility] Fax Number: [phone] (Adult)  Pt meets inpatient criteria per Madelin Rear  Attending Physician will be Dr. Loni Beckwith   Report can be called to:[phone]-Pager number, please leave a returned phone number to receive a phone call back.   Pt can arrive after 9:00am   Care Team notified: Janit Pagan Odetta Pink, MSW, [facility] 09/09/2023 5:27 PM

## 2023-09-09 NOTE — ED Notes (Signed)
Pt having 2nd phone call °

## 2023-09-09 NOTE — ED Provider Notes (Addendum)
Behavioral Health Urgent Care Medical Screening Exam  Patient Name: Albert [name] MRN: [id number] Date of Evaluation: 09/09/23 Chief Complaint:  "disorganized thoughts" Diagnosis:  Final diagnoses:  Schizoaffective disorder, bipolar type (HCC)    History of Present illness: Albert [name] is a 47 y.o. male patient presented to [facility] as a walk in  unaccompanied  with complaints of "disorganized thoughts"  Albert [name], 47 y.o., male patient seen face to face by this provider, consulted with Dr. Lucianne Muss; and chart reviewed on 09/09/23.  Per chart review patient has a past psychiatric history of schizoaffective disorder bipolar type.  In the past past he has had services with Dr. Betti Cruz.  He is unable to state how long it has been since he has taken any psychiatric medications.  He has a history of multiple inpatient psychiatric admissions with last admission being 04/2023.  Patient reports he lives alone and is unemployed.  He denies any substance use.  [name] presents to [facility] wondering if someone could help with his disorganized thoughts.  Reports he is feeling dizzy from his thoughts in his head.  During evaluation Albert [name] is observed sitting in the assessment area talking to someone who was not there and laughing inappropriately.  He is alert/oriented x 4, cooperative, and inattentive.  He is able to answer some of the assessment questions but he is experiencing thought blocking.  He would often go off topic.  He is disorganized and bizarre in his presentation and often stares off to the side.  He is actively responding to internal/external stimuli.  He is initially calm with clear speech at a normal rate and tone.  However he is labile and when asked if he is experiencing any auditory or visual hallucinations he became extremely agitated and began yelling at this Clinical research associate.  He was difficult to de-escalate.  He does appears paranoid. He is not sleeping  because a woman has been stomping on his head all night.  States "I am not sure if this is a hit to nip or hit to kill"  He is lacking judgment and insight and appears psychotic.Will recommend IP psychiatric treatment.   Patient placed under IVC by this Clinical research associate.  IVC findings are as follows, "Respondent has a psychiatric history of schizoaffective disorder bipolar type and psychosis.  Respondent presents today complaining of disorganized thoughts.  Respondent is actively responding to internal/external stimuli.  Respondent is talking to people and laughing inappropriately when no one is there.  Respondent is exhibiting thought blocking and is unable to answer basic questions.  Respondent became verbally aggressive during assessment and had to be de-escalated, he is labile.  Respondent is lacking judgment and insight.  Respondent is psychotic.  Respondent cannot be discharged at this time as he could be a danger to others or himself.  Respondent is recommended for inpatient psychiatric admission"  Patient refused Zyprexa 5 mg and Ativan 1 mg.  The continuous assessment unit is currently at capacity.  Patient will be transferred to [facility] ED Dr. Wallace Cullens is the accepting MD while awaiting inpatient psychiatric bed availability.    Flowsheet Row ED from 09/09/2023 in [facility] ED from 07/05/2023 in [facility] Emergency Department at [facility] ED from 05/25/2023 in [facility] Emergency Department at [name]  C-SSRS RISK CATEGORY No Risk No Risk No Risk       Psychiatric Specialty Exam  Presentation  General Appearance:Casual  Eye Contact:Fleeting  Speech:Blocked; Clear and Coherent (  some thought blocking)  Speech Volume:Normal (increased at times)  Handedness:Right   Mood and Affect  Mood: Anxious; Angry; Labile  Affect: Congruent   Thought Process  Thought Processes: Disorganized  Descriptions of  Associations:Tangential  Orientation:Full (Time, Place and Person)  Thought Content:Scattered; Tangential  Diagnosis of Schizophrenia or Schizoaffective disorder in past: Yes  Duration of Psychotic Symptoms: Greater than six months  Hallucinations:None Patient said the voices tell him "you are not allowed to eat" or "you can't leave the house" Voices that say "mean things to me"  Ideas of Reference:None  Suicidal Thoughts:No  Homicidal Thoughts:No   Sensorium  Memory: Immediate Poor; Remote Poor; Recent Poor  Judgment: Impaired  Insight: Lacking   Executive Functions  Concentration: Fair  Attention Span: Fair  Recall: [employer] of Knowledge: Fair  Language: Fair   Psychomotor Activity  Psychomotor Activity: Normal   Assets  Assets: Physical Health; Resilience; Social Support   Sleep  Sleep: Good  Number of hours: No data recorded  Physical Exam: Physical Exam Vitals and nursing note reviewed.  Constitutional:      Appearance: Normal appearance.  Eyes:     General:        Right eye: No discharge.        Left eye: No discharge.     Conjunctiva/sclera: Conjunctivae normal.  Cardiovascular:     Rate and Rhythm: Normal rate.  Pulmonary:     Effort: Pulmonary effort is normal. No respiratory distress.  Musculoskeletal:        General: Normal range of motion.     Cervical back: Normal range of motion.  Neurological:     Mental Status: He is alert and oriented to person, place, and time.  Psychiatric:        Attention and Perception: He is inattentive.        Mood and Affect: Mood is anxious. Affect is labile.        Speech: Speech is rapid and pressured (at times).        Behavior: Behavior is cooperative.        Thought Content: Thought content is paranoid.        Cognition and Memory: Cognition normal.        Judgment: Judgment is impulsive.    Review of Systems  Constitutional:  Negative for chills and fever.  HENT:  Negative  for hearing loss.   Respiratory:  Negative for cough and shortness of breath.   Cardiovascular:  Negative for chest pain.  Neurological:  Negative for speech change.  Psychiatric/Behavioral:  The patient is nervous/anxious.    Blood pressure (!) 140/81, pulse 89, temperature 97.6 F (36.4 C), temperature source Oral, resp. rate 18, SpO2 98%. There is no height or weight on file to calculate BMI.  Musculoskeletal: Strength & Muscle Tone: within normal limits Gait & Station: normal Patient leans: N/A   [facility] MSE Discharge Disposition for Follow up and Recommendations: Based on my evaluation I certify that psychiatric inpatient services furnished can reasonably be expected to improve the patient's condition which I recommend transfer to an appropriate accepting facility.   Patient meets criteria for inpatient psychiatric admission criteria.  [facility] unit notified and patient is under review pending discharges.  Patient placed under IVC.   The continuous assessment unit is currently at capacity.  Patient will be transferred to [facility] ED Dr. Wallace Cullens is the accepting MD while awaiting inpatient psychiatric bed availability.  Medication recommendations: Zyprexa 5 mg twice daily  Ardis Hughs, NP 09/09/2023, 8:49 AM

## 2023-09-09 NOTE — ED Notes (Signed)
Pt ambulatory to purple zone, calm and cooperative at this time.

## 2023-09-09 NOTE — Progress Notes (Signed)
LCSW Progress Note  [id number]   Albert [name]  09/09/2023  3:06 PM  Description:   Inpatient Psychiatric Referral  Patient was recommended inpatient per Vernard Gambles NP There are no available beds at [facility], per [facility] Rona Ravens RN. Patient was referred to the following out of network facilities:    Destination  Service Provider Address Phone Fax  [facility]  [address] [place] [address] [phone]  [facility] [address]  [place] [address] [phone]  [facility]  [address] [place] [address] [phone]  [facility]  [address] [place] [address] [phone]  [facility] [address]  [address] [place] [address] [phone]  [facility]  [address] [place], [place] [address] [phone]  [facility]  [address] [place] [address] [phone]  [facility]  [address] [place] [address] [phone]  [facility]  [address] [place] [address] [phone]  [facility]  [address], [place] [address] [phone]  [facility]  [address], [place] [address] [phone]  [facility]  [address] [place] [address] [phone]  [facility]  [address] [place] [address] [phone]  [facility]  [address] [place], [place] [phone]  [facility]  [address] [place] [address] [phone]  [facility]  [address], [place] [address] [phone]  [facility]   [address] [place] [address] [phone]  [facility]  [address], [place] [address] [phone]  [facility]  [place] [phone]  [facility]  [address], [place] [address] [phone]      Situation ongoing, CSW to continue following and update chart as more information becomes available.     [place] Haydn Hutsell LCSW-A   09/09/2023 3:07 PM   09/09/2023 3:06 PM

## 2023-09-09 NOTE — ED Notes (Signed)
Updated pt that he will be going to [facility] in the morning d/t pt asking if there is anything he can update his mom about. Pt obviously responding to internal stimuli, labile, crying, odd behavior, staring at things that aren't there w/ eyes darting back and forth, thought blocking.

## 2023-09-09 NOTE — Consult Note (Signed)
  Patient originally seen by Vernard Gambles, NP at [facility]. Pt has hx of schizoaffective disorder, and is under IVC. He currently appears psychotic, disorganized, responding to internal stimuli, and recommended for inpatient psychiatric treatment.   Will continue recommended Zyprexa 5 mg BID  Continue with recommendations of IVC and inpatient psychiatric treatment

## 2023-09-09 NOTE — ED Notes (Signed)
Pt had 1st phone call

## 2023-09-09 NOTE — ED Provider Notes (Addendum)
[place] EMERGENCY DEPARTMENT AT [facility] Provider Note   CSN: [id number] Arrival date & time: 09/09/23  1034     History  Chief Complaint  Patient presents with   IVC/Z04.6    Albert [name] is a 47 y.o. male.  Patient just brought over from behavioral health urgent care.  Patient is under IVC.  They sent him in for medical clearance.  They have already diagnosed him with schizoaffective bipolar disorder that he went to see them for disorganized thoughts.  They are recommending that he have behavioral health admission.  Patient without any specific complaints.  Patient is on Depakote.  So we will check level.  Past medical history significant for drug abuse seizures paranoid schizophrenia alcoholism and bipolar disorder.  Patient without any specific complaints.  Patient denies any ingestions.       Home Medications Prior to Admission medications   Medication Sig Start Date End Date Taking? Authorizing Provider  ADVIL 200 MG CAPS Take 200-400 mg by mouth every 6 (six) hours as needed (for headaches).    [provider]  divalproex (DEPAKOTE ER) 250 MG 24 hr tablet Take 250 mg by mouth daily.    [provider]  divalproex (DEPAKOTE ER) 500 MG 24 hr tablet Take 2 tablets (1,000 mg total) by mouth at bedtime. 08/01/22   Sarita Bottom, MD  omeprazole (PRILOSEC) 40 MG capsule Take 40 mg by mouth daily as needed (for heartburn or reflux).    [provider]  QUEtiapine (SEROQUEL) 100 MG tablet Take 100 mg by mouth at bedtime.    [provider]  triamcinolone cream (KENALOG) 0.1 % Apply 1 Application topically 2 (two) times daily as needed (for itching or irritation).    [provider]      Allergies    Patient has no known allergies.    Review of Systems   Review of Systems  Constitutional:  Negative for chills and fever.  HENT:  Negative for ear pain and sore throat.   Eyes:  Negative for pain and visual  disturbance.  Respiratory:  Negative for cough and shortness of breath.   Cardiovascular:  Negative for chest pain and palpitations.  Gastrointestinal:  Negative for abdominal pain and vomiting.  Genitourinary:  Negative for dysuria and hematuria.  Musculoskeletal:  Negative for arthralgias and back pain.  Skin:  Negative for color change and rash.  Neurological:  Negative for seizures and syncope.  Psychiatric/Behavioral:  Positive for agitation. Negative for self-injury.   All other systems reviewed and are negative.   Physical Exam Updated Vital Signs BP 112/81 (BP Location: Left Arm)   Pulse 87   Temp 98.2 F (36.8 C) (Oral)   Resp 17   Ht 1.778 m (5\' 10" )   SpO2 100%   BMI 32.58 kg/m  Physical Exam Vitals and nursing note reviewed.  Constitutional:      General: He is not in acute distress.    Appearance: Normal appearance. He is well-developed.  HENT:     Head: Normocephalic and atraumatic.  Eyes:     Extraocular Movements: Extraocular movements intact.     Conjunctiva/sclera: Conjunctivae normal.     Pupils: Pupils are equal, round, and reactive to light.  Cardiovascular:     Rate and Rhythm: Normal rate and regular rhythm.     Heart sounds: No murmur heard. Pulmonary:     Effort: Pulmonary effort is normal. No respiratory distress.     Breath sounds: Normal breath sounds.  Abdominal:     Palpations: Abdomen is soft.     Tenderness: There is no abdominal tenderness.  Musculoskeletal:        General: No swelling.     Cervical back: Normal range of motion and neck supple.  Skin:    General: Skin is warm and dry.     Capillary Refill: Capillary refill takes less than 2 seconds.  Neurological:     General: No focal deficit present.     Mental Status: He is alert and oriented to person, place, and time.  Psychiatric:        Mood and Affect: Mood normal.     ED Results / Procedures / Treatments   Labs (all labs ordered are listed, but only abnormal results  are displayed) Labs Reviewed  COMPREHENSIVE METABOLIC PANEL - Abnormal; Notable for the following components:      Result Value   CO2 21 (*)    Glucose, Bld 101 (*)    All other components within normal limits  SALICYLATE LEVEL - Abnormal; Notable for the following components:   Salicylate Lvl <7.0 (*)    All other components within normal limits  ACETAMINOPHEN LEVEL - Abnormal; Notable for the following components:   Acetaminophen (Tylenol), Serum <10 (*)    All other components within normal limits  ETHANOL  CBC  RAPID URINE DRUG SCREEN, HOSP PERFORMED  VALPROIC ACID LEVEL    EKG EKG Interpretation Date/Time:  Monday September 09 2023 12:48:24 EST Ventricular Rate:  76 PR Interval:  140 QRS Duration:  82 QT Interval:  398 QTC Calculation: 447 R Axis:   66  Text Interpretation: Sinus rhythm with Premature supraventricular complexes Otherwise normal ECG When compared with ECG of 25-May-2023 11:53, PREVIOUS ECG IS PRESENT Confirmed by Vanetta Mulders [phone]) on 09/09/2023 12:51:26 PM  Radiology No results found.  Procedures Procedures    Medications Ordered in ED Medications - No data to display  ED Course/ Medical Decision Making/ A&P                                 Medical Decision Making Amount and/or Complexity of Data Reviewed Labs: ordered.   Patient is IVC.  Patient does appear manic.  Will medically clear and then contact behavioral health for admission.  Patient's labs urine drug screen negative.  Patient's Depakote level still pending.  Complete metabolic panel normal.  CBC normal.  Alcohol less than 10 and salicylate less than 7 acetaminophen less than 10.  EKG without any acute findings.  Once we know the Depakote level then behavioral health can be recontacted for admission.  Patient Depakote level less than 10.  Certainly not toxic from that.  Will clarify whether he supposed be taking it.  Patient cleared for evaluation by behavioral health.   Patient is medically cleared.   Final Clinical Impression(s) / ED Diagnoses Final diagnoses:  Schizoaffective disorder, bipolar type [facility])    Rx / DC Orders ED Discharge Orders     None         Vanetta Mulders, MD 09/09/23 1154    Vanetta Mulders, MD 09/09/23 1326    Vanetta Mulders, MD 09/09/23 1436

## 2023-09-09 NOTE — ED Notes (Signed)
Rn called for transport and serve of ivc paper work.

## 2023-09-09 NOTE — ED Notes (Signed)
Patient appears to be laughing at internal stimuli.

## 2023-09-09 NOTE — ED Notes (Signed)
Patient refused the zyprexa and ativan rn will notify provider

## 2023-09-09 NOTE — ED Notes (Signed)
Pt refused zyprexa. States he does not like the way it makes him feel. Requesting Trazodone to help him sleep. Reached out to NP who states he can have ativan, not ordering trazodone. Pt provided with the update. Does not want Ativan right now. Reports he will ask for the ativan if he needs it.

## 2023-09-10 NOTE — ED Notes (Signed)
Report received from Selinda Eon RN. Assumed care of pt at this time.

## 2023-09-10 NOTE — ED Provider Notes (Signed)
Emergency Medicine Observation Re-evaluation Note  Albert [name] is a 47 y.o. male, seen on rounds today.  Pt initially presented to the ED for complaints of IVC/Z04.6 Currently, the patient is under IVC< awaiting inpatient psychiatric care.  Physical Exam  BP 106/82 (BP Location: Left Arm)   Pulse 94   Temp 98 F (36.7 C) (Oral)   Resp 18   Ht 5\' 10"  (1.778 m)   SpO2 96%   BMI 32.58 kg/m  Physical Exam General: NAD Cardiac: well perfused Lungs: even and unlabored Psych: no agitation  ED Course / MDM  EKG:EKG Interpretation Date/Time:  Monday September 09 2023 12:48:24 EST Ventricular Rate:  76 PR Interval:  140 QRS Duration:  82 QT Interval:  398 QTC Calculation: 447 R Axis:   66  Text Interpretation: Sinus rhythm with Premature supraventricular complexes Otherwise normal ECG When compared with ECG of 25-May-2023 11:53, PREVIOUS ECG IS PRESENT Confirmed by Vanetta Mulders [phone]) on 09/09/2023 12:51:26 PM  I have reviewed the labs performed to date as well as medications administered while in observation.  Recent changes in the last 24 hours include accepted to [facility] yesterday, Dr. Loni Beckwith. Waiting on transport this am.  Plan  Current plan is for inpatient psychiatric care.    Ernie Avena, MD 09/10/23 [phone]

## 2023-09-10 NOTE — ED Notes (Signed)
Sheriff's department called for transportation of patient to [facility]

## 2023-09-30 ENCOUNTER — Emergency Department (HOSPITAL_COMMUNITY)
Admission: EM | Admit: 2023-09-30 | Discharge: 2023-10-02 | Disposition: A | Discharge status: Other Acute Inpatient Hospital | Payer: 59 | Attending: Emergency Medicine | Admitting: Emergency Medicine

## 2023-09-30 ENCOUNTER — Other Ambulatory Visit: Payer: Self-pay

## 2023-09-30 DIAGNOSIS — F22 Delusional disorders: Secondary | ICD-10-CM

## 2023-09-30 DIAGNOSIS — F200 Paranoid schizophrenia: Secondary | ICD-10-CM | POA: Diagnosis present

## 2023-09-30 DIAGNOSIS — F419 Anxiety disorder, unspecified: Secondary | ICD-10-CM | POA: Diagnosis not present

## 2023-09-30 DIAGNOSIS — F250 Schizoaffective disorder, bipolar type: Secondary | ICD-10-CM | POA: Diagnosis present

## 2023-09-30 DIAGNOSIS — R4789 Other speech disturbances: Secondary | ICD-10-CM | POA: Diagnosis not present

## 2023-09-30 DIAGNOSIS — Z91199 Patient's noncompliance with other medical treatment and regimen due to unspecified reason: Secondary | ICD-10-CM

## 2023-09-30 DIAGNOSIS — Z87891 Personal history of nicotine dependence: Secondary | ICD-10-CM | POA: Diagnosis not present

## 2023-09-30 DIAGNOSIS — Y900 Blood alcohol level of less than 20 mg/100 ml: Secondary | ICD-10-CM | POA: Diagnosis not present

## 2023-09-30 DIAGNOSIS — F1021 Alcohol dependence, in remission: Secondary | ICD-10-CM

## 2023-09-30 LAB — CBC WITH DIFFERENTIAL/PLATELET
Abs Immature Granulocytes: 0.02 10*3/uL (ref 0.00–0.07)
Basophils Absolute: 0 10*3/uL (ref 0.0–0.1)
Basophils Relative: 1 %
Eosinophils Absolute: 0.1 10*3/uL (ref 0.0–0.5)
Eosinophils Relative: 1 %
HCT: 40 % (ref 39.0–52.0)
Hemoglobin: 13.8 g/dL (ref 13.0–17.0)
Immature Granulocytes: 0 %
Lymphocytes Relative: 36 %
Lymphs Abs: 2.8 10*3/uL (ref 0.7–4.0)
MCH: 31.4 pg (ref 26.0–34.0)
MCHC: 34.5 g/dL (ref 30.0–36.0)
MCV: 91.1 fL (ref 80.0–100.0)
Monocytes Absolute: 0.6 10*3/uL (ref 0.1–1.0)
Monocytes Relative: 8 %
Neutro Abs: 4.1 10*3/uL (ref 1.7–7.7)
Neutrophils Relative %: 54 %
Platelets: 279 10*3/uL (ref 150–400)
RBC: 4.39 MIL/uL (ref 4.22–5.81)
RDW: 12.8 % (ref 11.5–15.5)
WBC: 7.6 10*3/uL (ref 4.0–10.5)
nRBC: 0 % (ref 0.0–0.2)

## 2023-09-30 LAB — RAPID URINE DRUG SCREEN, HOSP PERFORMED
Amphetamines: NOT DETECTED
Barbiturates: NOT DETECTED
Benzodiazepines: NOT DETECTED
Cocaine: NOT DETECTED
Opiates: NOT DETECTED
Tetrahydrocannabinol: NOT DETECTED

## 2023-09-30 LAB — COMPREHENSIVE METABOLIC PANEL
ALT: 14 U/L (ref 0–44)
AST: 20 U/L (ref 15–41)
Albumin: 3.5 g/dL (ref 3.5–5.0)
Alkaline Phosphatase: 49 U/L (ref 38–126)
Anion gap: 10 (ref 5–15)
BUN: 10 mg/dL (ref 6–20)
CO2: 22 mmol/L (ref 22–32)
Calcium: 8.8 mg/dL — ABNORMAL LOW (ref 8.9–10.3)
Chloride: 108 mmol/L (ref 98–111)
Creatinine, Ser: 0.73 mg/dL (ref 0.61–1.24)
GFR, Estimated: >60 mL/min (ref >60)
Glucose, Bld: 93 mg/dL (ref 70–99)
Potassium: 4 mmol/L (ref 3.5–5.1)
Sodium: 140 mmol/L (ref 135–145)
Total Bilirubin: 0.4 mg/dL (ref <1.2)
Total Protein: 6.3 g/dL — ABNORMAL LOW (ref 6.5–8.1)

## 2023-09-30 LAB — ETHANOL: Alcohol, Ethyl (B): <10 mg/dL (ref <10)

## 2023-09-30 MED ORDER — BENZTROPINE MESYLATE 1 MG PO TABS
1.0000 mg | ORAL_TABLET | Freq: Once | ORAL | Status: DC
  Filled 2023-09-30: qty 1

## 2023-09-30 MED ORDER — QUETIAPINE FUMARATE 200 MG PO TABS
200.0000 mg | ORAL_TABLET | Freq: Every day | ORAL | Status: DC
  Administered 2023-09-30 – 2023-10-01 (×2): 200 mg via ORAL
  Filled 2023-09-30: qty 1
  Filled 2023-09-30: qty 2

## 2023-09-30 MED ORDER — BENZTROPINE MESYLATE 1 MG PO TABS
1.0000 mg | ORAL_TABLET | Freq: Every day | ORAL | Status: DC
  Filled 2023-09-30: qty 1

## 2023-09-30 MED ORDER — DIVALPROEX SODIUM 500 MG PO DR TAB
500.0000 mg | DELAYED_RELEASE_TABLET | Freq: Two times a day (BID) | ORAL | Status: DC
  Administered 2023-09-30 – 2023-10-01 (×3): 500 mg via ORAL
  Filled 2023-09-30: qty 2
  Filled 2023-09-30 (×2): qty 1

## 2023-09-30 NOTE — ED Triage Notes (Signed)
Pt BIB [facility] from [facility], staff is familiar with him but told EMS this morning the pt seemed off, was pacing the store aimlessly and was twitching. EMS states the pt told them the voices were denying him and could not stay on topic well when questions were asked. Pt was recently here under and spent time at [facility]. Pt is a/o.

## 2023-09-30 NOTE — ED Notes (Signed)
Pt lying down at this time.  This RN offered to turn off lights, pt politely declines.

## 2023-09-30 NOTE — ED Notes (Signed)
Pt making phone call at this time.  Pt calm and cooperative.

## 2023-09-30 NOTE — ED Notes (Signed)
Pt wanded, changed out, pt belongings in locker #5, valuables with security.

## 2023-09-30 NOTE — ED Notes (Signed)
Received verbal order for diet from [facility] MD resident

## 2023-09-30 NOTE — ED Provider Notes (Signed)
Patient medically clear for pending TTS/Psych evaluation.   Wynetta Fines, MD 09/30/23 [phone]

## 2023-09-30 NOTE — ED Notes (Signed)
Pt received for care at 1900.  At this time, pt sitting on bed, looking around room.  Bed in lowest position, wheels locked.  Pt in view of nurse's station with necessary precautions in place.

## 2023-09-30 NOTE — Consult Note (Signed)
Telepsych Consultation   Reason for Consult:  Psychiatry evaluation Referring Physician:  ER Physician Location of Patient:  Location of Provider: Other: [facility]  Patient Identification: Albert [name] MRN:  [id number] Principal Diagnosis: <principal problem not specified> Diagnosis:  Active Problems:   Schizoaffective disorder, bipolar type (HCC)   Paranoid schizophrenia (HCC)   Total Time spent with patient: 20 minutes  Subjective:   Albert [name] is a 47 y.o. male patient admitted with patient admitted with hx of Paranoid Schizophrenia, Schizoaffective disorder brought in by EMS from a [facility] pharmacy where staff called EMS to report that patient, known to staff said he was not feeling himself.  Staff reported to EMS that patient was pacing around the Pharmacy and was twitching as well.  Patient was released from [facility] in [place] where he was hospitalized last week.  HPI:  Patient is  a 47 years old male who presented calm, cooperative but Bizarre.  Initially he refused to look into the Camera but was squinting and closing his eyes or look up the Ceiling as if he was seeing something.  Patient needed frequent redirecting before he could focus and engage in the evaluation.  Patient reports that he recently left [facility] where he was sent for treatment for few day.  Patient states that he is not feeling himself.  He explained that the tenant above him are constantly stomping above his apartment making is difficult for him to sleep or rest.  He also states he had to change that apartment he has been in for 18 months.  He reported to his mother who he says did not believe him.  He now has a new apartment.  He admits to taking his Medications, Knows his dosages and even named his Psychiatrist as DR Betti Cruz and where he practices.  Patient, as he was talking he was moving his body a lot in the chair, rolling his eyes around and intermittently looked up the ceiling as if seeing something up.   He denied AVH.Marland Kitchen Patient reports poor sleep since he came out of the hospital.  He also reported that he was let go too soon from [facility] as well.  Patient declined permission to speak to his mother and he is here voluntarily.  His speech is pressures and some times disorganized but easily redirectable.  He told EMS before arriving at the ER that the Voices were denying him but would not say what the voices are denying of. Patient"s UDS is negative, Alcohol level is negative.  Patient overall is restless with Bizarre behavior.  We will Monitor overnight and reevaluate in the morning for inpatient Hospitalization.  He has no new Medication that could be have been causing his behavior change.  We are not sure he moved because of his neighbor and refused collateral information from his mother.  We will offer him Seroquel and Depakote, add Benztropine in case he is having EPS from the Seroquel.  Past Psychiatric History: hx of Paranoid Schizophrenia, Schizoaffective disorder.  Outpatient Psychiatrist is DR Betti Cruz with [facility].  He was released from [facility] last week  Risk to Self:   Risk to Others:   Prior Inpatient Therapy:   Prior Outpatient Therapy:    Past Medical History:  Past Medical History:  Diagnosis Date   Alcoholism (HCC)    Bipolar disorder (HCC)    Drug abuse (HCC)    xanax addiction 5 years ago   Heartburn    Medical non-compliance    Paranoid schizophrenia (HCC)  Schizophrenia (HCC)    Seizures (HCC)     Past Surgical History:  Procedure Laterality Date   NO PAST SURGERIES     Family History:  Family History  Problem Relation Age of Onset   Thyroid disease Mother    Depression Maternal Aunt    Family Psychiatric  History: unknown Social History:  Social History   Substance and Sexual Activity  Alcohol Use Not Currently   Comment: has been sober since June 4th     Social History   Substance and Sexual Activity  Drug Use Not Currently   Types: Marijuana,  Benzodiazepines   Comment: THC: was smoking 1 ounce every day, last use: April & xanax use was 10 mg every day for "a few months"    Social History   Socioeconomic History   Marital status: Single    Spouse name: Not on file   Number of children: Not on file   Years of education: Not on file   Highest education level: Not on file  Occupational History    Comment: Unclear  Tobacco Use   Smoking status: Former    Current packs/day: 0.00    Types: Cigarettes    Quit date: 02/21/2022    Years since quitting: 1.6   Smokeless tobacco: Never   Tobacco comments:    He stopped smoking in April of 2023. He said that he used to smoke "a few packs per day."   Vaping Use   Vaping status: Never Used  Substance and Sexual Activity   Alcohol use: Not Currently    Comment: has been sober since June 4th   Drug use: Not Currently    Types: Marijuana, Benzodiazepines    Comment: THC: was smoking 1 ounce every day, last use: April & xanax use was 10 mg every day for "a few months"   Sexual activity: Not Currently  Other Topics Concern   Not on file  Social History Narrative   Pt reported that he lives in a house apartment.   Social Determinants of Health   Financial Resource Strain: Low Risk  (05/26/2023)   Received from [facility]   Overall Financial Resource Strain (CARDIA)    Difficulty of Paying Living Expenses: Not hard at all  Food Insecurity: No Food Insecurity (05/26/2023)   Received from [facility]   Hunger Vital Sign    Worried About Running Out of Food in the Last Year: Never true    Ran Out of Food in the Last Year: Never true  Transportation Needs: No Transportation Needs (05/26/2023)   Received from [facility] - Transportation    Lack of Transportation (Medical): No    Lack of Transportation (Non-Medical): No  Physical Activity: Inactive (05/26/2023)   Received from [facility]   Exercise Vital Sign    Days of Exercise per Week: 0 days     Minutes of Exercise per Session: 0 min  Stress: Stress Concern Present (05/26/2023)   Received from [facility] - Occupational Stress Questionnaire    Feeling of Stress : Rather much  Social Connections: Socially Isolated (05/26/2023)   Received from [facility]   Social Connection and Isolation Panel [NHANES]    Frequency of Communication with Friends and Family: Never    Frequency of Social Gatherings with Friends and Family: Never    Attends Religious Services: Never    Database administrator or Organizations:  No    Attends Banker Meetings: Never    Marital Status: Never married   Additional Social History:    Allergies:   Allergies  Allergen Reactions   Chicken Allergy Other (See Comments)    Pt reports he doesn't eat chicken    Labs:  Results for orders placed or performed during the hospital encounter of 09/30/23 (from the past 48 hour(s))  Comprehensive metabolic panel     Status: Abnormal   Collection Time: 09/30/23  8:13 AM  Result Value Ref Range   Sodium 140 135 - 145 mmol/L   Potassium 4.0 3.5 - 5.1 mmol/L   Chloride 108 98 - 111 mmol/L   CO2 22 22 - 32 mmol/L   Glucose, Bld 93 70 - 99 mg/dL    Comment: Glucose reference range applies only to samples taken after fasting for at least 8 hours.   BUN 10 6 - 20 mg/dL   Creatinine, Ser 2.95 0.61 - 1.24 mg/dL   Calcium 8.8 (L) 8.9 - 10.3 mg/dL   Total Protein 6.3 (L) 6.5 - 8.1 g/dL   Albumin 3.5 3.5 - 5.0 g/dL   AST 20 15 - 41 U/L   ALT 14 0 - 44 U/L   Alkaline Phosphatase 49 38 - 126 U/L   Total Bilirubin 0.4 <1.2 mg/dL   GFR, Estimated >28 >41 mL/min    Comment: (NOTE) Calculated using the CKD-EPI Creatinine Equation (2021)    Anion gap 10 5 - 15    Comment: Performed at [facility], [address], [place], [place] [address]  Ethanol     Status: None   Collection Time: 09/30/23  8:13 AM  Result Value Ref Range   Alcohol, Ethyl (B) <10  <10 mg/dL    Comment: (NOTE) Lowest detectable limit for serum alcohol is 10 mg/dL.  For medical purposes only. Performed at [facility], [address], [place], [place] [address]   CBC with Diff     Status: None   Collection Time: 09/30/23  8:13 AM  Result Value Ref Range   WBC 7.6 4.0 - 10.5 K/uL   RBC 4.39 4.22 - 5.81 MIL/uL   Hemoglobin 13.8 13.0 - 17.0 g/dL   HCT 53.6 64.4 - 03.4 %   MCV 91.1 80.0 - 100.0 fL   MCH 31.4 26.0 - 34.0 pg   MCHC 34.5 30.0 - 36.0 g/dL   RDW 74.2 59.5 - 63.8 %   Platelets 279 150 - 400 K/uL   nRBC 0.0 0.0 - 0.2 %   Neutrophils Relative % 54 %   Neutro Abs 4.1 1.7 - 7.7 K/uL   Lymphocytes Relative 36 %   Lymphs Abs 2.8 0.7 - 4.0 K/uL   Monocytes Relative 8 %   Monocytes Absolute 0.6 0.1 - 1.0 K/uL   Eosinophils Relative 1 %   Eosinophils Absolute 0.1 0.0 - 0.5 K/uL   Basophils Relative 1 %   Basophils Absolute 0.0 0.0 - 0.1 K/uL   Immature Granulocytes 0 %   Abs Immature Granulocytes 0.02 0.00 - 0.07 K/uL    Comment: Performed at [facility], [address] [place], [place] [address]  Urine rapid drug screen (hosp performed)     Status: None   Collection Time: 09/30/23 12:11 PM  Result Value Ref Range   Opiates NONE DETECTED NONE DETECTED   Cocaine NONE DETECTED NONE DETECTED   Benzodiazepines NONE DETECTED NONE DETECTED   Amphetamines NONE DETECTED  NONE DETECTED   Tetrahydrocannabinol NONE DETECTED NONE DETECTED   Barbiturates NONE DETECTED NONE DETECTED    Comment: (NOTE) DRUG SCREEN FOR MEDICAL PURPOSES ONLY.  IF CONFIRMATION IS NEEDED FOR ANY PURPOSE, NOTIFY LAB WITHIN 5 DAYS.  LOWEST DETECTABLE LIMITS FOR URINE DRUG SCREEN Drug Class                     Cutoff (ng/mL) Amphetamine and metabolites    1000 Barbiturate and metabolites    200 Benzodiazepine                 200 Opiates and metabolites        300 Cocaine and metabolites        300 THC                            50 Performed at [facility]  [facility], [address] [place], [place] [address]     Medications:  Current Facility-Administered Medications  Medication Dose Route Frequency Provider Last Rate Last Admin   benztropine (COGENTIN) tablet 1 mg  1 mg Oral QHS Aneesah Hernan C, NP       benztropine (COGENTIN) tablet 1 mg  1 mg Oral Once Saquoia Sianez C, NP       divalproex (DEPAKOTE) DR tablet 500 mg  500 mg Oral Q12H Maya Arcand C, NP       QUEtiapine (SEROQUEL) tablet 200 mg  200 mg Oral QHS Dahlia Byes C, NP       Current Outpatient Medications  Medication Sig Dispense Refill   divalproex (DEPAKOTE) 500 MG DR tablet Take 500 mg by mouth 2 (two) times daily.     ibuprofen (ADVIL) 200 MG tablet Take 200-800 mg by mouth every 6 (six) hours as needed for moderate pain (pain score 4-6).     omeprazole (PRILOSEC) 40 MG capsule Take 40 mg by mouth daily as needed (for heartburn or reflux).     QUEtiapine (SEROQUEL) 200 MG tablet Take 200 mg by mouth at bedtime.     divalproex (DEPAKOTE) 250 MG DR tablet Take by mouth. (Patient not taking: Reported on 09/30/2023)     QUEtiapine (SEROQUEL) 100 MG tablet Take 100 mg by mouth at bedtime. (Patient not taking: Reported on 09/09/2023)      Musculoskeletal: Strength & Muscle Tone: within normal limits Gait & Station: normal Patient leans: Front          Psychiatric Specialty Exam:  Presentation  General Appearance:  Casual; Neat  Eye Contact: Fleeting  Speech: Normal Rate  Speech Volume: Normal  Handedness: Right   Mood and Affect  Mood: Anxious  Affect: Congruent   Thought Process  Thought Processes: Disorganized  Descriptions of Associations:Tangential  Orientation:Full (Time, Place and Person)  Thought Content:Paranoid Ideation; Logical  History of Schizophrenia/Schizoaffective disorder:Yes  Duration of Psychotic Symptoms:Greater than six months  Hallucinations:Hallucinations: None  Ideas of Reference:None  Suicidal  Thoughts:Suicidal Thoughts: No  Homicidal Thoughts:Homicidal Thoughts: No   Sensorium  Memory: Immediate Good; Recent Good; Remote Fair  Judgment: Fair  Insight: Fair   Art therapist  Concentration: Fair  Attention Span: Fair  Recall: Good  Fund of Knowledge: Good  Language: Good   Psychomotor Activity  Psychomotor Activity: Psychomotor Activity: Increased; Restlessness   Assets  Assets: Manufacturing systems engineer; Housing; Social Support   Sleep  Sleep: Sleep: Fair    Physical Exam: Physical Exam Vitals and nursing note reviewed.  Constitutional:      Appearance: Normal appearance.  HENT:     Nose: Nose normal.  Cardiovascular:     Rate and Rhythm: Normal rate and regular rhythm.  Pulmonary:     Effort: Pulmonary effort is normal.  Musculoskeletal:        General: Normal range of motion.  Neurological:     Mental Status: He is alert and oriented to person, place, and time.  Psychiatric:        Attention and Perception: He is inattentive. He perceives auditory hallucinations.        Mood and Affect: Mood is anxious. Affect is inappropriate.        Speech: Speech is rapid and pressured and tangential.        Behavior: Behavior is cooperative.        Thought Content: Thought content is paranoid and delusional.    Review of Systems  Constitutional: Negative.   HENT: Negative.    Eyes: Negative.   Respiratory: Negative.    Cardiovascular: Negative.   Gastrointestinal: Negative.   Genitourinary: Negative.   Musculoskeletal: Negative.   Skin: Negative.   Neurological: Negative.   Endo/Heme/Allergies: Negative.   Psychiatric/Behavioral:  The patient is nervous/anxious.    Blood pressure 137/84, pulse 78, temperature 98.8 F (37.1 C), temperature source Oral, resp. rate 20, height 5\' 10"  (1.778 m), weight 100 kg, SpO2 100%. Body mass index is 31.63 kg/m.  Treatment Plan Summary: Daily contact with patient to assess and evaluate  symptoms and progress in treatment, Medication management, Plan Resume home Medications, Add Cogentin 1 mg every night for drug induced EPS, Offer 1 mg Cogentin now. , and Reevaluate in am for need for rehospitalization.  Disposition:  Monitor Overnight and reevaluate in am  This service was provided via telemedicine using a 2-way, interactive audio and video technology.  Names of all persons participating in this telemedicine service and their role in this encounter. Name: Julieanne Cotton Role: PMHNP-BC  Name: Eligha Bridegroom Role: PMHNP-BC   Earney Navy, NP-PMHNP-BC 09/30/2023 4:18 PM

## 2023-09-30 NOTE — ED Provider Notes (Signed)
[facility] EMERGENCY DEPARTMENT AT [facility] Provider Note   CSN: [id number] Arrival date & time: 09/30/23  7829     History  Chief Complaint  Patient presents with   Psychiatric Evaluation    Albert [name] is a 47 y.o. male.  40-year-old male with a past medical history of schizoaffective disorder presents here via EMS for concerns of behavioral abnormality.  Patient states that he was in his duplex this morning when he heard a loud bang.  Patient was concerned about this noise.  He did not feel safe in his home.  Therefore, went to [facility] where he "knows everyone" and feels safe.  The staff at [facility] were concerned about the patient's behavior as they do know him and felt that he was acting differently than normal.  EMS was called.  Patient is calm, cooperative here.  He denies SI or HI.  He endorses being compliant with his Depakote.  States that he did not take his Seroquel last night but otherwise has been compliant with this as well.  Last saw his psychiatrist last week on Wednesday before Thanksgiving.  The history is provided by the patient.       Home Medications Prior to Admission medications   Medication Sig Start Date End Date Taking? Authorizing Provider  omeprazole (PRILOSEC) 40 MG capsule Take 40 mg by mouth daily as needed (for heartburn or reflux).    [provider]  QUEtiapine (SEROQUEL) 100 MG tablet Take 100 mg by mouth at bedtime. Patient not taking: Reported on 09/09/2023    [provider]      Allergies    Chicken allergy    Review of Systems   As noted in HPI  Physical Exam Updated Vital Signs BP 122/87   Pulse 76   Temp 98.5 F (36.9 C) (Oral)   Resp 16   Ht 5\' 10"  (1.778 m)   Wt 100 kg   SpO2 100%   BMI 31.63 kg/m  Physical Exam Vitals reviewed.  Constitutional:      General: He is not in acute distress.    Appearance: Normal appearance. He is normal weight. He is not ill-appearing,  toxic-appearing or diaphoretic.  Cardiovascular:     Rate and Rhythm: Normal rate and regular rhythm.     Heart sounds: Normal heart sounds. No murmur heard.    No friction rub. No gallop.  Pulmonary:     Effort: Pulmonary effort is normal. No respiratory distress.     Breath sounds: Normal breath sounds. No wheezing, rhonchi or rales.  Abdominal:     General: There is no distension.     Palpations: Abdomen is soft.     Tenderness: There is no abdominal tenderness. There is no guarding or rebound.  Skin:    General: Skin is warm and dry.  Neurological:     Mental Status: He is alert and oriented to person, place, and time.     Sensory: No sensory deficit.     Motor: No weakness.  Psychiatric:        Attention and Perception: Attention and perception normal. He does not perceive auditory or visual hallucinations.        Mood and Affect: Mood normal. Affect is flat.        Speech: Speech is tangential. Speech is not rapid and pressured, delayed or slurred.        Behavior: Behavior is cooperative.        Thought Content: Thought content is  paranoid. Thought content is not delusional. Thought content does not include homicidal or suicidal ideation. Thought content does not include homicidal or suicidal plan.     ED Results / Procedures / Treatments   Labs (all labs ordered are listed, but only abnormal results are displayed) Labs Reviewed - No data to display  EKG None  Radiology No results found.  Procedures Procedures    Medications Ordered in ED Medications - No data to display  ED Course/ Medical Decision Making/ A&P Clinical Course as of 09/30/23 1646  Mon Sep 30, 2023  1003 Independently reviewed this patient's labs, which are overall unremarkable without clinically significant findings. [JR]  1559 S- hx schizoaffective disorder, heard banging in his house, so he went to [facility] where he felt safe and they called EMS.  -pending SW recs [HJ]    Clinical Course  User Index [HJ] Janyth Pupa, MD [JR] Rolla Flatten, MD                                 Medical Decision Making Amount and/or Complexity of Data Reviewed Labs: ordered.   47 year old male presents here for concerns of abnormal behavior.  Vitals reassuring on presentation.  On my exam, patient noted to be paranoid.  Not endorsing SI or HI.  Does not appear to be responding to internal stimuli.  Denies any substance use.  Endorses being compliant with his psychiatric medications.  Follows with a psychiatrist regularly.  Patient states that he would like to speak with a psychiatry provider here.  Will plan to engage TTS team.  Psychiatry screening labs ordered.  Screening labs unremarkable and without clinically significant findings. TTS consulted and plans to evaluation patient this afternoon. Patient is here voluntarily. I do not feel that IVC is indicated at this time. He has remained calm and cooperative. Awaiting formal Psychiatric evaluation.   Care of this patient transferred to Dr. Jean Rosenthal at the conclusion of my shift.   Patient's presentation is most consistent with exacerbation of chronic illness.         Final Clinical Impression(s) / ED Diagnoses Final diagnoses:  Paranoia [facility])    Rx / DC Orders ED Discharge Orders     None         Rolla Flatten, MD 09/30/23 1652    Wynetta Fines, MD 10/01/23 [phone]

## 2023-10-01 ENCOUNTER — Encounter (HOSPITAL_COMMUNITY): Payer: Self-pay

## 2023-10-01 DIAGNOSIS — F250 Schizoaffective disorder, bipolar type: Secondary | ICD-10-CM | POA: Diagnosis not present

## 2023-10-01 DIAGNOSIS — F200 Paranoid schizophrenia: Secondary | ICD-10-CM

## 2023-10-01 NOTE — Progress Notes (Signed)
Pt was accepted to [facility] TODAY 10/01/2023; Bed Assignment Cheron Every  Pt meets inpatient criteria per Vivien Presto  Attending Physician will be Dr. Len Childs, MD  Report can be called to: - [phone]  Pt can arrive after: BED IS READY NOW  Care Team notified: Tildon Husky, LCSWA 10/01/2023 @ 11:53 PM

## 2023-10-01 NOTE — ED Provider Notes (Signed)
Emergency Medicine Observation Re-evaluation Note  Albert [name] is a 47 y.o. male, seen on rounds today.  Pt initially presented to the ED for complaints of Psychiatric Evaluation Pt with hx schizoaffective disorder, with delusional thoughts. Currently resting, no new c/o this AM.   Physical Exam  BP 119/84 (BP Location: Left Arm)   Pulse 92   Temp 98.4 F (36.9 C) (Oral)   Resp 18   Ht 1.778 m (5\' 10" )   Wt 100 kg   SpO2 100%   BMI 31.63 kg/m  Physical Exam General: resting.  Cardiac: regular rate. Lungs: breathing comfortably. Psych: calm.   ED Course / MDM    I have reviewed the labs performed to date as well as medications administered while in observation.  Recent changes in the last 24 hours include ED obs, reassessment.   Plan  From chart review, not clear that patient is significantly changed or acutely ill as compared to baseline. Home meds continued.   BH team to reassess this AM. Dispo per [facility] team.     Cathren Laine, MD 10/01/23 [phone]

## 2023-10-01 NOTE — Progress Notes (Signed)
This CSW communicated with [facility] Intake, [place] to inform that pt already had an accepting bed offer. Pt will accepted to [facility] TODAY and can transfer ASAP.  Maryjean Ka, MSW, LCSWA 10/01/2023 11:59 PM

## 2023-10-01 NOTE — ED Notes (Signed)
Pt awake at this time, sitting on bed.

## 2023-10-01 NOTE — ED Notes (Signed)
Spoke with staff from [facility]. Pt can arrive tomorrow 10/02/23 at 0900 [facility]. Accepting provider Janace Hoard NP. Report can be called after 0730 at [phone].

## 2023-10-01 NOTE — Consult Note (Signed)
Telepsych Consultation   Reason for Consult:  "Paranoia" Referring Physician:  Dr. Kristine Royal Location of Patient:    [facility] Location of Provider: Other: virtual home office  Patient Identification: Albert [name] MRN:  [id number] Principal Diagnosis: Paranoid schizophrenia (HCC) Diagnosis:  Principal Problem:   Paranoid schizophrenia (HCC) Active Problems:   Medical non-compliance   Schizoaffective disorder, bipolar type (HCC)   Alcohol use disorder, moderate, in sustained remission (HCC)   Total Time spent with patient: 20 minutes  Tele Assessment Subjective:   Albert [name] is a 47 y.o. male patient admitted with Per RN Triage Note dated . "Pt BIB [facility] from [facility], staff is familiar with him but told EMS this morning the pt seemed off, was pacing the store aimlessly and was twitching. EMS states the pt told them the voices were denying him and could not stay on topic well when questions were asked. Pt was recently here under and spent time at [facility]. Pt is a/o. "  HPI:   Albert [name], 47 y.o., male patient.   Patient seen via telepsych by this provider; chart reviewed and consulted with Dr. Enedina Finner on 10/01/23.  On evaluation Albert [name] is observed laying in bed, appears asleep but sits up when greeted by this Clinical research associate.  Pt greeted and given anticipatory guidance, he agrees to assessment.  Pt is alert and oriented x4;  He makes direct eye contact and on longer avoids looking at the camera.  He is not looking at the ceiling and there's no concern he's responding to internal stimulus.  Initially presents as calm and appropriately interactive. However, as the interview progresses patient appears disorganized and scattered, and it difficult to redirect.  Patient provides circumstantial information and frequently digresses to events that have no bearing on his current hospitalization.  Patient does not allow interruptions for clarification.  When  this writer asks for clarification, patient talks louder and ignores questions.  This Clinical research associate practiced active listening but patient did not pause between sentence and provides run on statements.  After several unsuccessful attempts to redirect, advised patient video would be disconnected.    What can be gleaned from information he provided; pt recently moved into a new apartment; he was jarred by  loud noises coming from the neighbors above his head and suddenly felt unsafe. He reports a history of being traumatized by noisy neighbors at his previous residence and reports this stomping noise he heard prior to admission triggered him. Patient has a hx for alcoholism but unclear if he was drinking at the time.  However, admission UDS is negative for alcohol or illicit drugs.    Labs: CMP: Calcium and protein are marginally low but do not suspect this contributes significance to his presentation CBC: WBCs are WNL, no leukocytosis EKG: no updated EKG since 09/11/2023  During evaluation Albert [name] is observed seated on the hospital bed; He is fairly groomed and dressed in [facility] hospital scrubs. He is alert/oriented x 3; his mood is unpredictable, initially calm and appropriately interactive but abruptly becomes frustrated and uncooperative; and mood congruent with affect.  Patient speaks in a loud tone with pressured speech. He makes good eye contact.  His thought process is disorganized and irrelevant.  There is no indication that he is currently responding to internal/external stimuli.  Patient denies suicidal/self-harm/homicidal ideation.    Per ED Provider Admission Assessment 09/30/2023@0614 : " Chief Complaint  Patient presents with   Psychiatric Evaluation      Albert [name] is a  47 y.o. male.   57-year-old male with a past medical history of schizoaffective disorder presents here via EMS for concerns of behavioral abnormality.  Patient states that he was in his duplex this  morning when he heard a loud bang.  Patient was concerned about this noise.  He did not feel safe in his home.  Therefore, went to [facility] where he "knows everyone" and feels safe.  The staff at [facility] were concerned about the patient's behavior as they do know him and felt that he was acting differently than normal.  EMS was called.  Patient is calm, cooperative here.  He denies SI or HI.  He endorses being compliant with his Depakote.  States that he did not take his Seroquel last night but otherwise has been compliant with this as well.  Last saw his psychiatrist last week on Wednesday before Thanksgiving.   The history is provided by the patient. "   Past Psychiatric History: as outlined below.  Risk to Self:  yes, potentially d/t mental decline  Risk to Others:  yes, potentially dt mental decline Prior Inpatient Therapy:  yes Prior Outpatient Therapy:  yes  Past Medical History:  Past Medical History:  Diagnosis Date   Alcoholism (HCC)    Bipolar disorder (HCC)    Drug abuse (HCC)    xanax addiction 5 years ago   Heartburn    Medical non-compliance    Paranoid schizophrenia (HCC)    Schizophrenia (HCC)    Seizures (HCC)     Past Surgical History:  Procedure Laterality Date   NO PAST SURGERIES     Family History:  Family History  Problem Relation Age of Onset   Thyroid disease Mother    Depression Maternal Aunt    Family Psychiatric  History: deferred Social History:  Social History   Substance and Sexual Activity  Alcohol Use Not Currently   Comment: has been sober since June 4th     Social History   Substance and Sexual Activity  Drug Use Not Currently   Types: Marijuana, Benzodiazepines   Comment: THC: was smoking 1 ounce every day, last use: April & xanax use was 10 mg every day for "a few months"    Social History   Socioeconomic History   Marital status: Single    Spouse name: Not on file   Number of children: Not on file   Years of education:  Not on file   Highest education level: Not on file  Occupational History    Comment: Unclear  Tobacco Use   Smoking status: Former    Current packs/day: 0.00    Types: Cigarettes    Quit date: 02/21/2022    Years since quitting: 1.6   Smokeless tobacco: Never   Tobacco comments:    He stopped smoking in April of 2023. He said that he used to smoke "a few packs per day."   Vaping Use   Vaping status: Never Used  Substance and Sexual Activity   Alcohol use: Not Currently    Comment: has been sober since June 4th   Drug use: Not Currently    Types: Marijuana, Benzodiazepines    Comment: THC: was smoking 1 ounce every day, last use: April & xanax use was 10 mg every day for "a few months"   Sexual activity: Not Currently  Other Topics Concern   Not on file  Social History Narrative   Pt reported that he lives in a house apartment.   Social Determinants  of Health   Financial Resource Strain: Low Risk  (05/26/2023)   Received from [facility]   Overall Financial Resource Strain (CARDIA)    Difficulty of Paying Living Expenses: Not hard at all  Food Insecurity: No Food Insecurity (05/26/2023)   Received from [facility]   Hunger Vital Sign    Worried About Running Out of Food in the Last Year: Never true    Ran Out of Food in the Last Year: Never true  Transportation Needs: No Transportation Needs (05/26/2023)   Received from [facility] - Transportation    Lack of Transportation (Medical): No    Lack of Transportation (Non-Medical): No  Physical Activity: Inactive (05/26/2023)   Received from [facility]   Exercise Vital Sign    Days of Exercise per Week: 0 days    Minutes of Exercise per Session: 0 min  Stress: Stress Concern Present (05/26/2023)   Received from [facility] - Occupational Stress Questionnaire    Feeling of Stress : Rather much  Social Connections: Socially Isolated (05/26/2023)    Received from [facility]   Social Connection and Isolation Panel [NHANES]    Frequency of Communication with Friends and Family: Never    Frequency of Social Gatherings with Friends and Family: Never    Attends Religious Services: Never    Database administrator or Organizations: No    Attends Banker Meetings: Never    Marital Status: Never married   Additional Social History:    Allergies:   Allergies  Allergen Reactions   Chicken Allergy Other (See Comments)    Pt reports he doesn't eat chicken    Labs:  Results for orders placed or performed during the hospital encounter of 09/30/23 (from the past 48 hour(s))  Comprehensive metabolic panel     Status: Abnormal   Collection Time: 09/30/23  8:13 AM  Result Value Ref Range   Sodium 140 135 - 145 mmol/L   Potassium 4.0 3.5 - 5.1 mmol/L   Chloride 108 98 - 111 mmol/L   CO2 22 22 - 32 mmol/L   Glucose, Bld 93 70 - 99 mg/dL    Comment: Glucose reference range applies only to samples taken after fasting for at least 8 hours.   BUN 10 6 - 20 mg/dL   Creatinine, Ser 5.36 0.61 - 1.24 mg/dL   Calcium 8.8 (L) 8.9 - 10.3 mg/dL   Total Protein 6.3 (L) 6.5 - 8.1 g/dL   Albumin 3.5 3.5 - 5.0 g/dL   AST 20 15 - 41 U/L   ALT 14 0 - 44 U/L   Alkaline Phosphatase 49 38 - 126 U/L   Total Bilirubin 0.4 <1.2 mg/dL   GFR, Estimated >64 >40 mL/min    Comment: (NOTE) Calculated using the CKD-EPI Creatinine Equation (2021)    Anion gap 10 5 - 15    Comment: Performed at [facility], [address], [place], [place] [address]  Ethanol     Status: None   Collection Time: 09/30/23  8:13 AM  Result Value Ref Range   Alcohol, Ethyl (B) <10 <10 mg/dL    Comment: (NOTE) Lowest detectable limit for serum alcohol is 10 mg/dL.  For medical purposes only. Performed at [facility], [address] [place], [place] [address]   CBC with Diff     Status: None   Collection Time:  09/30/23  8:13 AM  Result Value Ref  Range   WBC 7.6 4.0 - 10.5 K/uL   RBC 4.39 4.22 - 5.81 MIL/uL   Hemoglobin 13.8 13.0 - 17.0 g/dL   HCT 46.9 62.9 - 52.8 %   MCV 91.1 80.0 - 100.0 fL   MCH 31.4 26.0 - 34.0 pg   MCHC 34.5 30.0 - 36.0 g/dL   RDW 41.3 24.4 - 01.0 %   Platelets 279 150 - 400 K/uL   nRBC 0.0 0.0 - 0.2 %   Neutrophils Relative % 54 %   Neutro Abs 4.1 1.7 - 7.7 K/uL   Lymphocytes Relative 36 %   Lymphs Abs 2.8 0.7 - 4.0 K/uL   Monocytes Relative 8 %   Monocytes Absolute 0.6 0.1 - 1.0 K/uL   Eosinophils Relative 1 %   Eosinophils Absolute 0.1 0.0 - 0.5 K/uL   Basophils Relative 1 %   Basophils Absolute 0.0 0.0 - 0.1 K/uL   Immature Granulocytes 0 %   Abs Immature Granulocytes 0.02 0.00 - 0.07 K/uL    Comment: Performed at [facility], [address] [place], [place] [address]  Urine rapid drug screen (hosp performed)     Status: None   Collection Time: 09/30/23 12:11 PM  Result Value Ref Range   Opiates NONE DETECTED NONE DETECTED   Cocaine NONE DETECTED NONE DETECTED   Benzodiazepines NONE DETECTED NONE DETECTED   Amphetamines NONE DETECTED NONE DETECTED   Tetrahydrocannabinol NONE DETECTED NONE DETECTED   Barbiturates NONE DETECTED NONE DETECTED    Comment: (NOTE) DRUG SCREEN FOR MEDICAL PURPOSES ONLY.  IF CONFIRMATION IS NEEDED FOR ANY PURPOSE, NOTIFY LAB WITHIN 5 DAYS.  LOWEST DETECTABLE LIMITS FOR URINE DRUG SCREEN Drug Class                     Cutoff (ng/mL) Amphetamine and metabolites    1000 Barbiturate and metabolites    200 Benzodiazepine                 200 Opiates and metabolites        300 Cocaine and metabolites        300 THC                            50 Performed at [facility], [address], [place], [place] [address]     Medications:  Current Facility-Administered Medications  Medication Dose Route Frequency Provider Last Rate Last Admin   benztropine (COGENTIN) tablet 1 mg  1 mg Oral QHS Onuoha, Josephine C, NP       benztropine (COGENTIN) tablet  1 mg  1 mg Oral Once Dahlia Byes C, NP       divalproex (DEPAKOTE) DR tablet 500 mg  500 mg Oral Q12H Onuoha, Josephine C, NP   500 mg at 10/01/23 1043   QUEtiapine (SEROQUEL) tablet 200 mg  200 mg Oral QHS Dahlia Byes C, NP   200 mg at 09/30/23 2102   Current Outpatient Medications  Medication Sig Dispense Refill   divalproex (DEPAKOTE) 500 MG DR tablet Take 500 mg by mouth 2 (two) times daily.     ibuprofen (ADVIL) 200 MG tablet Take 200-800 mg by mouth every 6 (six) hours as needed for moderate pain (pain score 4-6).     omeprazole (PRILOSEC) 40 MG capsule Take 40 mg by mouth daily as needed (for heartburn or reflux).  QUEtiapine (SEROQUEL) 200 MG tablet Take 200 mg by mouth at bedtime.     divalproex (DEPAKOTE) 250 MG DR tablet Take by mouth. (Patient not taking: Reported on 09/30/2023)     QUEtiapine (SEROQUEL) 100 MG tablet Take 100 mg by mouth at bedtime. (Patient not taking: Reported on 09/09/2023)      Musculoskeletal: pt observed moving all extremities;  Per chart review, ambulates independently.  Strength & Muscle Tone: within normal limits Gait & Station: normal Patient leans: N/A  Psychiatric Specialty Exam:  Presentation  General Appearance:  Bizarre; Fairly Groomed  Eye Contact: Good  Speech: Pressured  Speech Volume: Increased  Handedness: Right   Mood and Affect  Mood: Dysphoric; Irritable  Affect: Congruent; Blunt   Thought Process  Thought Processes: Disorganized  Descriptions of Associations:Tangential  Orientation:Full (Time, Place and Person)  Thought Content:Illogical; Scattered; Paranoid Ideation  History of Schizophrenia/Schizoaffective disorder:Yes  Duration of Psychotic Symptoms:Greater than six months  Hallucinations:Hallucinations: None  Ideas of Reference:None  Suicidal Thoughts:Suicidal Thoughts: No  Homicidal Thoughts:Homicidal Thoughts: No   Sensorium  Memory: Immediate Fair; Recent Fair; Remote  Poor  Judgment: Poor  Insight: Lacking   Executive Functions  Concentration: Poor  Attention Span: Poor  Recall: Fair  Fund of Knowledge: Fair  Language: Good   Psychomotor Activity  Psychomotor Activity: Psychomotor Activity: Increased   Assets  Assets: Financial Resources/Insurance; Social Support; Housing   Sleep  Sleep: Sleep: Fair Number of Hours of Sleep: 4    Physical Exam: Physical Exam Vitals and nursing note reviewed.  Cardiovascular:     Rate and Rhythm: Normal rate.     Pulses: Normal pulses.  Pulmonary:     Effort: Pulmonary effort is normal.  Musculoskeletal:     Cervical back: Normal range of motion.  Neurological:     Mental Status: He is alert.  Psychiatric:        Attention and Perception: Perception normal.        Mood and Affect: Mood is anxious. Affect is blunt.        Speech: Speech is rapid and pressured.        Behavior: Behavior is uncooperative and agitated.        Thought Content: Thought content is paranoid and delusional. Thought content does not include homicidal or suicidal ideation. Thought content does not include homicidal or suicidal plan.        Cognition and Memory: Cognition is impaired. Memory is impaired.        Judgment: Judgment is impulsive and inappropriate.    Review of Systems  Constitutional: Negative.   HENT: Negative.    Eyes: Negative.   Respiratory: Negative.    Cardiovascular: Negative.   Gastrointestinal: Negative.   Genitourinary: Negative.   Musculoskeletal: Negative.   Skin: Negative.   Neurological: Negative.   Endo/Heme/Allergies: Negative.   Psychiatric/Behavioral:  The patient is nervous/anxious.    Blood pressure 119/84, pulse 92, temperature 98.4 F (36.9 C), temperature source Oral, resp. rate 18, height 5\' 10"  (1.778 m), weight 100 kg, SpO2 100%. Body mass index is 31.63 kg/m.  Treatment Plan Summary: Patient with hx for paranoid schizophrenia presents via EMS for  evaluation of paranoid behaviors and feeling unsafe at home.  Patient was evaluated by psychiatry on 12/2 and recommended for overnight observation and AM reassessment.  On assessment today, patient's behavior is unpredictable, his speech is pressured and disorganized.  While his is alert and oriented, he lacks insight of his concerns which impairs his ability to  make good decisions.  Considering he resides alone and does not feel safe at home d/t historically loud noises that have not been substantiated; will refer for inpatient admission when he can be monitored for safety and medication adjustments.   Patient is established with Dr. Betti Cruz for OP psychiatry.  He may benefit from ACTT services.  Will ask SW if they can provide resources upon discharge.   Recommend updated EKG to rule out prolonged QT intervals.   Continue medications, no changes recommended today.   Daily contact with patient to assess and evaluate symptoms and progress in treatment and Medication management  Disposition: Recommend psychiatric Inpatient admission when medically cleared.  This service was provided via telemedicine using a 2-way, interactive audio and video technology.  Dr. Denton Lank, ED Provider; Denton Ar, RN, Jaynie Collins, LCSW and Rona Ravens, [facility] were all informed of above recommendation and disposition vis secure chat.   Names of all persons participating in this telemedicine service and their role in this encounter. Name: Albert [name] "[name]" Role: Patient   Name: Ophelia Shoulder Role: PMHNP  Name: Uvaldo Rising Role: Psychiatrist    Chales Abrahams, NP 10/01/2023 2:00 PM

## 2023-10-01 NOTE — Progress Notes (Addendum)
LCSW Progress Note  [id number]   Albert [name]  10/01/2023  11:52 PM    Inpatient Behavioral Health Placement  Pt meets inpatient criteria per Shnese Mills,NP There are no available beds within [facility] system per [facility] Kim Brooks,RN. Referral was sent to the following facilities;   Destination  Service Provider Address Phone [facility]  [address] [place], [place] [address] [phone]  [facility]  [address] [place] [address] [phone]  [facility]  [address] [place] [address] [phone]  [facility] [address]  [address] [place] [address] [phone]  [facility]  [address] [place], [place] [address] [phone]  [facility]  [address] [place] [address] [phone]  [facility]  [address] [place] [address] [phone]  [facility]  [place] [phone]  [facility]  [address] [place] [address] [phone]  [facility]  [address], [place] [address] [phone]  [facility]  [address], [place] [address] [phone]  [facility]  [address], [place] [address] [phone]  [facility]  [address], [place] [address] [phone]  [facility] Patient Placement  [facility], [place] [phone]  [facility]  [address] [place], [place] [address] [phone]  [facility]  [address] [place] [address] [phone]  [facility]  [address] [place] [address] [phone]  [facility]  [address] [place] [address] [phone]  [facility]  [address] [place], [place] [phone]  [facility]  [address] [place] [address] [phone]  [facility] Inpatient EFAX  [place] [phone]      Situation ongoing,  CSW will follow up.    Maryjean Ka, MSW, LCSWA 10/01/2023 11:52 PM    Kelton Pillar, LCSWA 10/01/2023 @ 11:50 PM

## 2023-10-01 NOTE — ED Notes (Signed)
 Report received from Selinda Eon RN. Assumed care of pt at this time.

## 2023-10-02 NOTE — ED Provider Notes (Signed)
Emergency Medicine Observation Re-evaluation Note  Albert [name] is a 47 y.o. male, seen on rounds today.  Pt initially presented to the ED for complaints of Psychiatric Evaluation Pt with hx schizoaffective disorder, with delusional thoughts. Currently awake and eating breakfast  Physical Exam  BP 109/65 (BP Location: Left Arm)   Pulse 70   Temp (!) 97.5 F (36.4 C) (Oral)   Resp 17   Ht 5\' 10"  (1.778 m)   Wt 100 kg   SpO2 98%   BMI 31.63 kg/m  Physical Exam General: resting.  Cardiac: regular rate. Lungs: breathing comfortably. Psych: calm.   ED Course / MDM    I have reviewed the labs performed to date as well as medications administered while in observation.  Recent changes in the last 24 hours include ED obs, reassessment.   Plan  From chart review, not clear that patient is significantly changed or acutely ill as compared to baseline. Home meds continued.   Pysch placement     Melene Plan, DO 10/02/23 1610

## 2023-10-02 NOTE — ED Provider Notes (Signed)
Pt seen prior to transfer - alert, content, smiling, interacting with staff.   Vitals normal.  Pt accepted at [facility]. Pt appears stable for transfer.      Cathren Laine, MD 10/02/23 1022

## 2023-10-02 NOTE — Progress Notes (Signed)
This CSW received a phone call from [place] with [facility] and requested that pt transfer after 8am. Nursing team informed:  Modesty Marcelo Baldy, MSW, [facility] 10/02/2023 12:19 AM

## 2023-10-02 NOTE — ED Notes (Signed)
Chelsea M. LCSW informed me pt has been accepted to [facility]. I informed her, personnel from [facility] called to give acceptance info. She stated, "I had acceptance with [facility]. They are supposed to call social work. I saw [facility] called but I had already talked to [facility]." Pt can go to [facility] after 0800 today 10/02/23.   Pt meets inpatient criteria per Vivien Presto  Attending Physician will be Dr. Len Childs, MD  Report can be called to: - [phone]

## 2023-10-28 ENCOUNTER — Other Ambulatory Visit: Payer: Self-pay

## 2023-10-28 ENCOUNTER — Ambulatory Visit (HOSPITAL_COMMUNITY)
Admission: EM | Admit: 2023-10-28 | Discharge: 2023-10-28 | Disposition: A | Discharge status: Other Acute Inpatient Hospital | Payer: 59 | Attending: Psychiatry | Admitting: Psychiatry

## 2023-10-28 DIAGNOSIS — Z79899 Other long term (current) drug therapy: Secondary | ICD-10-CM | POA: Insufficient documentation

## 2023-10-28 DIAGNOSIS — F250 Schizoaffective disorder, bipolar type: Secondary | ICD-10-CM

## 2023-10-28 LAB — CBC WITH DIFFERENTIAL/PLATELET
Abs Immature Granulocytes: 0.02 10*3/uL (ref 0.00–0.07)
Basophils Absolute: 0.1 10*3/uL (ref 0.0–0.1)
Basophils Relative: 1 %
Eosinophils Absolute: 0.2 10*3/uL (ref 0.0–0.5)
Eosinophils Relative: 3 %
HCT: 42.6 % (ref 39.0–52.0)
Hemoglobin: 14.4 g/dL (ref 13.0–17.0)
Immature Granulocytes: 0 %
Lymphocytes Relative: 34 %
Lymphs Abs: 2.7 10*3/uL (ref 0.7–4.0)
MCH: 30.9 pg (ref 26.0–34.0)
MCHC: 33.8 g/dL (ref 30.0–36.0)
MCV: 91.4 fL (ref 80.0–100.0)
Monocytes Absolute: 0.5 10*3/uL (ref 0.1–1.0)
Monocytes Relative: 7 %
Neutro Abs: 4.3 10*3/uL (ref 1.7–7.7)
Neutrophils Relative %: 55 %
Platelets: 275 10*3/uL (ref 150–400)
RBC: 4.66 MIL/uL (ref 4.22–5.81)
RDW: 13.2 % (ref 11.5–15.5)
WBC: 7.8 10*3/uL (ref 4.0–10.5)
nRBC: 0 % (ref 0.0–0.2)

## 2023-10-28 LAB — COMPREHENSIVE METABOLIC PANEL
ALT: 13 U/L (ref 0–44)
AST: 17 U/L (ref 15–41)
Albumin: 3.9 g/dL (ref 3.5–5.0)
Alkaline Phosphatase: 49 U/L (ref 38–126)
Anion gap: 9 (ref 5–15)
BUN: 11 mg/dL (ref 6–20)
CO2: 21 mmol/L — ABNORMAL LOW (ref 22–32)
Calcium: 9.1 mg/dL (ref 8.9–10.3)
Chloride: 107 mmol/L (ref 98–111)
Creatinine, Ser: 0.79 mg/dL (ref 0.61–1.24)
GFR, Estimated: >60 mL/min (ref >60)
Glucose, Bld: 90 mg/dL (ref 70–99)
Potassium: 4.1 mmol/L (ref 3.5–5.1)
Sodium: 137 mmol/L (ref 135–145)
Total Bilirubin: 0.4 mg/dL (ref <1.2)
Total Protein: 6.7 g/dL (ref 6.5–8.1)

## 2023-10-28 LAB — POCT URINE DRUG SCREEN - MANUAL ENTRY (I-SCREEN)
POC Amphetamine UR: NOT DETECTED
POC Buprenorphine (BUP): NOT DETECTED
POC Cocaine UR: NOT DETECTED
POC Marijuana UR: NOT DETECTED
POC Methadone UR: NOT DETECTED
POC Methamphetamine UR: NOT DETECTED
POC Morphine: NOT DETECTED
POC Oxazepam (BZO): NOT DETECTED
POC Oxycodone UR: NOT DETECTED
POC Secobarbital (BAR): NOT DETECTED

## 2023-10-28 LAB — LIPID PANEL
Cholesterol: 182 mg/dL (ref 0–200)
HDL: 38 mg/dL — ABNORMAL LOW (ref >40)
LDL Cholesterol: 115 mg/dL — ABNORMAL HIGH (ref 0–99)
Total CHOL/HDL Ratio: 4.8 {ratio}
Triglycerides: 145 mg/dL (ref <150)
VLDL: 29 mg/dL (ref 0–40)

## 2023-10-28 LAB — VALPROIC ACID LEVEL: Valproic Acid Lvl: 62 ug/mL (ref 50.0–100.0)

## 2023-10-28 LAB — TSH: TSH: 3.673 u[IU]/mL (ref 0.350–4.500)

## 2023-10-28 LAB — ETHANOL: Alcohol, Ethyl (B): <10 mg/dL (ref <10)

## 2023-10-28 MED ORDER — ZIPRASIDONE MESYLATE 20 MG IM SOLR: 20.0000 mg | INTRAMUSCULAR | Status: DC | PRN

## 2023-10-28 MED ORDER — LORAZEPAM 1 MG PO TABS: 1.0000 mg | ORAL_TABLET | ORAL | Status: DC | PRN

## 2023-10-28 MED ORDER — DIVALPROEX SODIUM 500 MG PO DR TAB
500.0000 mg | DELAYED_RELEASE_TABLET | Freq: Two times a day (BID) | ORAL | Status: DC
Start: 2023-10-28 — End: 2023-10-28
  Administered 2023-10-28: 500 mg via ORAL
  Filled 2023-10-28: qty 1

## 2023-10-28 MED ORDER — OLANZAPINE 10 MG PO TBDP: 10.0000 mg | ORAL_TABLET | Freq: Three times a day (TID) | ORAL | Status: DC | PRN

## 2023-10-28 MED ORDER — ACETAMINOPHEN 325 MG PO TABS: 650.0000 mg | ORAL_TABLET | Freq: Four times a day (QID) | ORAL | Status: DC | PRN

## 2023-10-28 MED ORDER — PALIPERIDONE ER 6 MG PO TB24
6.0000 mg | ORAL_TABLET | Freq: Every day | ORAL | Status: DC
  Filled 2023-10-28: qty 1

## 2023-10-28 MED ORDER — OLANZAPINE 5 MG PO TBDP
5.0000 mg | ORAL_TABLET | Freq: Once | ORAL | Status: AC
  Administered 2023-10-28: 5 mg via ORAL
  Filled 2023-10-28: qty 1

## 2023-10-28 MED ORDER — ALUM & MAG HYDROXIDE-SIMETH 200-200-20 MG/5ML PO SUSP: 30.0000 mL | ORAL | Status: DC | PRN

## 2023-10-28 MED ORDER — TRAZODONE HCL 50 MG PO TABS: 50.0000 mg | ORAL_TABLET | Freq: Every evening | ORAL | Status: DC | PRN

## 2023-10-28 MED ORDER — PANTOPRAZOLE SODIUM 40 MG PO TBEC
40.0000 mg | DELAYED_RELEASE_TABLET | Freq: Every day | ORAL | Status: DC
  Administered 2023-10-28: 40 mg via ORAL
  Filled 2023-10-28: qty 1

## 2023-10-28 MED ORDER — HYDROXYZINE HCL 25 MG PO TABS
25.0000 mg | ORAL_TABLET | Freq: Three times a day (TID) | ORAL | Status: DC | PRN
  Administered 2023-10-28: 25 mg via ORAL
  Filled 2023-10-28: qty 1

## 2023-10-28 MED ORDER — MAGNESIUM HYDROXIDE 400 MG/5ML PO SUSP: 30.0000 mL | Freq: Every day | ORAL | Status: DC | PRN

## 2023-10-28 MED ORDER — QUETIAPINE FUMARATE 200 MG PO TABS: 200.0000 mg | ORAL_TABLET | Freq: Every day | ORAL | Status: DC

## 2023-10-28 NOTE — ED Notes (Signed)
Birdie Hopes, RN at [facility] has been updated on patients desire to see medication packets before opening to ensure he is receiving the correct medication.

## 2023-10-28 NOTE — ED Provider Notes (Addendum)
[facility] Medical Screening Exam   Date: 10/28/23 Patient Name: Albert [name] MRN: [id number] Chief Complaint:  "voices that tell me to keel myself"  Diagnoses:  Final diagnoses:  Schizoaffective disorder, bipolar type (HCC)    [id number] presented to [facility] as a walk in accompanied by [employer] with complaints of "voices that tell me to keel myself"  Albert [name], 47 y.o., male patient seen face to face by this provider, consulted with Dr. Enedina Finner; and chart reviewed on 10/28/23.  Patient has a past psychiatric history of paranoid schizophrenia, and schizoaffective disorder bipolar type.  He lives alone in a private residence.  He denies all substance use.  On evaluation [name] reports he was at a [employer] station and someone called the police.  He told the police that he was hearing voices and they brought him to [facility] for assessment.  Patient also reports that he was recently discharged from [facility] and was prescribed Seroquel and Depakote.  He is unable to state the dosage.  He reports compliance with medications and states he was experiencing auditory hallucinations upon discharge from old [place].  During evaluation [name] is observed sitting in the assessment room with the lights off.  He appears anxious and restless.  He is tapping his feet on the floor and hands to his lap.  He is alert/oriented x 4, cooperative, and inattentive.  He is easily distracted.  He is disorganized in his thought process.  His appearance is disheveled.  He is unable to state when he slept last.  He is labile.  He is currently denying SI/HI.  He endorses auditory hallucinations of a man and woman's voice.  These voices are telling him to "[name] himself".  States the voices tell him to specifically spell kill this way.  He is responding to internal stimuli throughout the assessment.   Discussed inpatient psychiatric mission with patient and he is in agreement.  He  will be admitted to the continuous assessment unit while awaiting inpatient psychiatric bed availability.  Total Time spent with patient: 30 minutes  Musculoskeletal  Strength & Muscle Tone: within normal limits Gait & Station: normal Patient leans: N/A  Psychiatric Specialty Exam  Presentation General Appearance:  Bizarre; Disheveled  Eye Contact: Fleeting  Speech: Clear and Coherent; Pressured  Speech Volume: Increased  Handedness: Right   Mood and Affect  Mood: Anxious  Affect: Congruent; Labile   Thought Process  Thought Processes: Disorganized  Descriptions of Associations:Tangential  Orientation:Full (Time, Place and Person)  Thought Content:Paranoid Ideation; Scattered  Diagnosis of Schizophrenia or Schizoaffective disorder in past: Yes  Duration of Psychotic Symptoms: Greater than six months  Hallucinations:Hallucinations: Auditory Description of Auditory Hallucinations: voices that say "keel me"  Ideas of Reference:Paranoia  Suicidal Thoughts:Suicidal Thoughts: No  Homicidal Thoughts:Homicidal Thoughts: No   Sensorium  Memory: Recent Fair; Remote Fair; Immediate Fair  Judgment: Poor  Insight: Poor   Executive Functions  Concentration: Poor  Attention Span: Poor  Recall: Fair  Fund of Knowledge: Fair  Language: Good   Psychomotor Activity  Psychomotor Activity: Psychomotor Activity: Normal   Assets  Assets: Leisure Time; Physical Health; Resilience; Social Support   Sleep  Sleep: Sleep: Fair Number of Hours of Sleep: 0 (doesnt know when he has slept last)   Nutritional Assessment (For OBS and FBC admissions only) Has the patient had a weight loss or gain of 10 pounds or more in the last 3 months?: No Has the patient had a decrease  in food intake/or appetite?: No Does the patient have dental problems?: No Does the patient have eating habits or behaviors that may be indicators of an eating disorder  including binging or inducing vomiting?: No Has the patient recently lost weight without trying?: 2.0 Has the patient been eating poorly because of a decreased appetite?: 0 Malnutrition Screening Tool Score: 2    Physical Exam Vitals and nursing note reviewed.  Constitutional:      Appearance: Normal appearance.  Eyes:     General:        Right eye: No discharge.        [name] eye: No discharge.  Cardiovascular:     Rate and Rhythm: Normal rate.  Pulmonary:     Effort: Pulmonary effort is normal. No respiratory distress.  Musculoskeletal:        General: Normal range of motion.     Cervical back: Normal range of motion.  Skin:    Coloration: Skin is not jaundiced or pale.  Neurological:     Mental Status: He is alert and oriented to person, place, and time.  Psychiatric:        Attention and Perception: He is inattentive.        Mood and Affect: Mood is anxious. Affect is labile.        Speech: Speech is tangential.        Behavior: Behavior is cooperative.        Thought Content: Thought content is paranoid.        Cognition and Memory: Cognition normal.        Judgment: Judgment normal.    Review of Systems  Constitutional: Negative.   HENT: Negative.    Eyes: Negative.   Respiratory: Negative.    Cardiovascular: Negative.   Musculoskeletal: Negative.   Skin: Negative.   Neurological: Negative.   Psychiatric/Behavioral:  Positive for hallucinations.     Blood pressure 135/78, pulse 89, resp. rate 19. There is no height or weight on file to calculate BMI.  Past Psychiatric History: Schizoaffective disorder bipolar type, paranoid schizophrenia.  Patient has a history of multiple inpatient psychiatric admissions.  Patient hospitalized this month at [facility] and most recently old [facility].   Is the patient at risk to self? No  Has the patient been a risk to self in the past 6 months? No .    Has the patient been a risk to self within the distant past? No   Is the  patient a risk to others? No   Has the patient been a risk to others in the past 6 months? No   Has the patient been a risk to others within the distant past? No   Past Medical History:  Past Medical History:  Diagnosis Date   Alcoholism (HCC)    Bipolar disorder (HCC)    Drug abuse (HCC)    xanax addiction 5 years ago   Heartburn    Medical non-compliance    Paranoid schizophrenia (HCC)    Schizophrenia (HCC)    Seizures (HCC)      Family History:  Mother-thyroid disease Maternal aunt-depression  Social History:  Lives alone in private residence Denies any substance use Unemployed Single  Last Labs:  Admission on 10/28/2023  Component Date Value Ref Range Status   WBC 10/28/2023 7.8  4.0 - 10.5 K/uL Final   RBC 10/28/2023 4.66  4.22 - 5.81 MIL/uL Final   Hemoglobin 10/28/2023 14.4  13.0 - 17.0 g/dL Final   HCT  10/28/2023 42.6  39.0 - 52.0 % Final   MCV 10/28/2023 91.4  80.0 - 100.0 fL Final   MCH 10/28/2023 30.9  26.0 - 34.0 pg Final   MCHC 10/28/2023 33.8  30.0 - 36.0 g/dL Final   RDW 28/41/3244 13.2  11.5 - 15.5 % Final   Platelets 10/28/2023 275  150 - 400 K/uL Final   nRBC 10/28/2023 0.0  0.0 - 0.2 % Final   Neutrophils Relative % 10/28/2023 55  % Final   Neutro Abs 10/28/2023 4.3  1.7 - 7.7 K/uL Final   Lymphocytes Relative 10/28/2023 34  % Final   Lymphs Abs 10/28/2023 2.7  0.7 - 4.0 K/uL Final   Monocytes Relative 10/28/2023 7  % Final   Monocytes Absolute 10/28/2023 0.5  0.1 - 1.0 K/uL Final   Eosinophils Relative 10/28/2023 3  % Final   Eosinophils Absolute 10/28/2023 0.2  0.0 - 0.5 K/uL Final   Basophils Relative 10/28/2023 1  % Final   Basophils Absolute 10/28/2023 0.1  0.0 - 0.1 K/uL Final   Immature Granulocytes 10/28/2023 0  % Final   Abs Immature Granulocytes 10/28/2023 0.02  0.00 - 0.07 K/uL Final   Performed at [facility], [address] [place], [place] [address]   Sodium 10/28/2023 137  135 - 145 mmol/L Final   Potassium 10/28/2023  4.1  3.5 - 5.1 mmol/L Final   Chloride 10/28/2023 107  98 - 111 mmol/L Final   CO2 10/28/2023 21 (L)  22 - 32 mmol/L Final   Glucose, Bld 10/28/2023 90  70 - 99 mg/dL Final   Glucose reference range applies only to samples taken after fasting for at least 8 hours.   BUN 10/28/2023 11  6 - 20 mg/dL Final   Creatinine, Ser 10/28/2023 0.79  0.61 - 1.24 mg/dL Final   Calcium 25/36/6440 9.1  8.9 - 10.3 mg/dL Final   Total Protein 34/74/2595 6.7  6.5 - 8.1 g/dL Final   Albumin 63/87/5643 3.9  3.5 - 5.0 g/dL Final   AST 32/95/1884 17  15 - 41 U/L Final   ALT 10/28/2023 13  0 - 44 U/L Final   Alkaline Phosphatase 10/28/2023 49  38 - 126 U/L Final   Total Bilirubin 10/28/2023 0.4  <1.2 mg/dL Final   GFR, Estimated 10/28/2023 >60  >60 mL/min Final   Comment: (NOTE) Calculated using the CKD-EPI Creatinine Equation (2021)    Anion gap 10/28/2023 9  5 - 15 Final   Performed at [facility], [address] [place], [place] [address]   Alcohol, Ethyl (B) 10/28/2023 <10  <10 mg/dL Final   Comment: (NOTE) Lowest detectable limit for serum alcohol is 10 mg/dL.  For medical purposes only. Performed at [facility], [address], [place], [place] [address]    POC Amphetamine UR 10/28/2023 None Detected  NONE DETECTED (Cut Off Level 1000 ng/mL) Final   POC Secobarbital (BAR) 10/28/2023 None Detected  NONE DETECTED (Cut Off Level 300 ng/mL) Final   POC Buprenorphine (BUP) 10/28/2023 None Detected  NONE DETECTED (Cut Off Level 10 ng/mL) Final   POC Oxazepam (BZO) 10/28/2023 None Detected  NONE DETECTED (Cut Off Level 300 ng/mL) Final   POC Cocaine UR 10/28/2023 None Detected  NONE DETECTED (Cut Off Level 300 ng/mL) Final   POC Methamphetamine UR 10/28/2023 None Detected  NONE DETECTED (Cut Off Level 1000 ng/mL) Final   POC Morphine 10/28/2023 None Detected  NONE DETECTED (Cut Off Level 300  ng/mL) Final   POC Methadone UR 10/28/2023 None Detected  NONE DETECTED (Cut Off Level 300 ng/mL)  Final   POC Oxycodone UR 10/28/2023 None Detected  NONE DETECTED (Cut Off Level 100 ng/mL) Final   POC Marijuana UR 10/28/2023 None Detected  NONE DETECTED (Cut Off Level 50 ng/mL) Final   Valproic Acid Lvl 10/28/2023 62  50.0 - 100.0 ug/mL Final   Performed at [facility], [address], [place], [place] [address]   Cholesterol 10/28/2023 182  0 - 200 mg/dL Final   Triglycerides 59/56/3875 145  <150 mg/dL Final   HDL 64/33/2951 38 (L)  >40 mg/dL Final   Total CHOL/HDL Ratio 10/28/2023 4.8  RATIO Final   VLDL 10/28/2023 29  0 - 40 mg/dL Final   LDL Cholesterol 10/28/2023 115 (H)  0 - 99 mg/dL Final   Comment:        Total Cholesterol/HDL:CHD Risk Coronary Heart Disease Risk Table                     Men   Women  1/2 Average Risk   3.4   3.3  Average Risk       5.0   4.4  2 X Average Risk   9.6   7.1  3 X Average Risk  23.4   11.0        Use the calculated Patient Ratio above and the CHD Risk Table to determine the patient's CHD Risk.        ATP III CLASSIFICATION (LDL):  <100     mg/dL   Optimal  884-166  mg/dL   Near or Above                    Optimal  130-159  mg/dL   Borderline  063-016  mg/dL   High  >010     mg/dL   Very High Performed at [facility], [address] [place], [place] [address]    TSH 10/28/2023 3.673  0.350 - 4.500 uIU/mL Final   Comment: Performed by a 3rd Generation assay with a functional sensitivity of <=0.01 uIU/mL. Performed at [facility], [address] [place], [place] [address]   Admission on 09/30/2023, Discharged on 10/02/2023  Component Date Value Ref Range Status   Sodium 09/30/2023 140  135 - 145 mmol/L Final   Potassium 09/30/2023 4.0  3.5 - 5.1 mmol/L Final   Chloride 09/30/2023 108  98 - 111 mmol/L Final   CO2 09/30/2023 22  22 - 32 mmol/L Final   Glucose, Bld 09/30/2023 93  70 - 99 mg/dL Final   Glucose reference range applies only to samples taken after fasting for at least 8 hours.   BUN 09/30/2023 10  6 -  20 mg/dL Final   Creatinine, Ser 09/30/2023 0.73  0.61 - 1.24 mg/dL Final   Calcium 02/54/2706 8.8 (L)  8.9 - 10.3 mg/dL Final   Total Protein 23/76/2831 6.3 (L)  6.5 - 8.1 g/dL Final   Albumin 51/76/1607 3.5  3.5 - 5.0 g/dL Final   AST 37/07/6268 20  15 - 41 U/L Final   ALT 09/30/2023 14  0 - 44 U/L Final   Alkaline Phosphatase 09/30/2023 49  38 - 126 U/L Final   Total Bilirubin 09/30/2023 0.4  <1.2 mg/dL Final   GFR, Estimated 09/30/2023 >60  >60 mL/min Final   Comment: (NOTE) Calculated using the CKD-EPI Creatinine Equation (2021)  Anion gap 09/30/2023 10  5 - 15 Final   Performed at [facility], [address] [place], [place] [address]   Alcohol, Ethyl (B) 09/30/2023 <10  <10 mg/dL Final   Comment: (NOTE) Lowest detectable limit for serum alcohol is 10 mg/dL.  For medical purposes only. Performed at [facility], [address], [place], [place] [address]    Opiates 09/30/2023 NONE DETECTED  NONE DETECTED Final   Cocaine 09/30/2023 NONE DETECTED  NONE DETECTED Final   Benzodiazepines 09/30/2023 NONE DETECTED  NONE DETECTED Final   Amphetamines 09/30/2023 NONE DETECTED  NONE DETECTED Final   Tetrahydrocannabinol 09/30/2023 NONE DETECTED  NONE DETECTED Final   Barbiturates 09/30/2023 NONE DETECTED  NONE DETECTED Final   Comment: (NOTE) DRUG SCREEN FOR MEDICAL PURPOSES ONLY.  IF CONFIRMATION IS NEEDED FOR ANY PURPOSE, NOTIFY LAB WITHIN 5 DAYS.  LOWEST DETECTABLE LIMITS FOR URINE DRUG SCREEN Drug Class                     Cutoff (ng/mL) Amphetamine and metabolites    1000 Barbiturate and metabolites    200 Benzodiazepine                 200 Opiates and metabolites        300 Cocaine and metabolites        300 THC                            50 Performed at [facility], [address], [place], [place] [address]    WBC 09/30/2023 7.6  4.0 - 10.5 K/uL Final   RBC 09/30/2023 4.39  4.22 - 5.81 MIL/uL Final   Hemoglobin 09/30/2023 13.8  13.0 - 17.0  g/dL Final   HCT 38/75/6433 40.0  39.0 - 52.0 % Final   MCV 09/30/2023 91.1  80.0 - 100.0 fL Final   MCH 09/30/2023 31.4  26.0 - 34.0 pg Final   MCHC 09/30/2023 34.5  30.0 - 36.0 g/dL Final   RDW 29/51/8841 12.8  11.5 - 15.5 % Final   Platelets 09/30/2023 279  150 - 400 K/uL Final   nRBC 09/30/2023 0.0  0.0 - 0.2 % Final   Neutrophils Relative % 09/30/2023 54  % Final   Neutro Abs 09/30/2023 4.1  1.7 - 7.7 K/uL Final   Lymphocytes Relative 09/30/2023 36  % Final   Lymphs Abs 09/30/2023 2.8  0.7 - 4.0 K/uL Final   Monocytes Relative 09/30/2023 8  % Final   Monocytes Absolute 09/30/2023 0.6  0.1 - 1.0 K/uL Final   Eosinophils Relative 09/30/2023 1  % Final   Eosinophils Absolute 09/30/2023 0.1  0.0 - 0.5 K/uL Final   Basophils Relative 09/30/2023 1  % Final   Basophils Absolute 09/30/2023 0.0  0.0 - 0.1 K/uL Final   Immature Granulocytes 09/30/2023 0  % Final   Abs Immature Granulocytes 09/30/2023 0.02  0.00 - 0.07 K/uL Final   Performed at [facility], [address], [place], [place] [address]  Admission on 09/09/2023, Discharged on 09/10/2023  Component Date Value Ref Range Status   Sodium 09/09/2023 136  135 - 145 mmol/L Final   Potassium 09/09/2023 3.9  3.5 - 5.1 mmol/L Final   Chloride 09/09/2023 106  98 - 111 mmol/L Final   CO2 09/09/2023 21 (L)  22 - 32 mmol/L Final   Glucose, Bld 09/09/2023 101 (H)  70 - 99 mg/dL Final   Glucose reference range applies only to samples taken after fasting for at least 8 hours.   BUN 09/09/2023 10  6 - 20 mg/dL Final   Creatinine, Ser 09/09/2023 0.97  0.61 - 1.24 mg/dL Final   Calcium 16/07/9603 9.3  8.9 - 10.3 mg/dL Final   Total Protein 54/06/8118 7.2  6.5 - 8.1 g/dL Final   Albumin 14/78/2956 4.0  3.5 - 5.0 g/dL Final   AST 21/30/8657 21  15 - 41 U/L Final   ALT 09/09/2023 22  0 - 44 U/L Final   Alkaline Phosphatase 09/09/2023 63  38 - 126 U/L Final   Total Bilirubin 09/09/2023 0.7  <1.2 mg/dL Final   GFR, Estimated 09/09/2023 >60   >60 mL/min Final   Comment: (NOTE) Calculated using the CKD-EPI Creatinine Equation (2021)    Anion gap 09/09/2023 9  5 - 15 Final   Performed at [facility], [address] [place], [place] [address]   Alcohol, Ethyl (B) 09/09/2023 <10  <10 mg/dL Final   Comment: (NOTE) Lowest detectable limit for serum alcohol is 10 mg/dL.  For medical purposes only. Performed at [facility], [address], [place], [place] [address]    Salicylate Lvl 09/09/2023 <7.0 (L)  7.0 - 30.0 mg/dL Final   Performed at [facility], [address], [place], [place] [address]   Acetaminophen (Tylenol), Serum 09/09/2023 <10 (L)  10 - 30 ug/mL Final   Comment: (NOTE) Therapeutic concentrations vary significantly. A range of 10-30 ug/mL  may be an effective concentration for many patients. However, some  are best treated at concentrations outside of this range. Acetaminophen concentrations >150 ug/mL at 4 hours after ingestion  and >50 ug/mL at 12 hours after ingestion are often associated with  toxic reactions.  Performed at [facility], [address], [place], [place] [address]    WBC 09/09/2023 7.8  4.0 - 10.5 K/uL Final   RBC 09/09/2023 4.93  4.22 - 5.81 MIL/uL Final   Hemoglobin 09/09/2023 15.2  13.0 - 17.0 g/dL Final   HCT 72/53/6644 45.6  39.0 - 52.0 % Final   MCV 09/09/2023 92.5  80.0 - 100.0 fL Final   MCH 09/09/2023 30.8  26.0 - 34.0 pg Final   MCHC 09/09/2023 33.3  30.0 - 36.0 g/dL Final   RDW 03/47/4259 12.8  11.5 - 15.5 % Final   Platelets 09/09/2023 316  150 - 400 K/uL Final   nRBC 09/09/2023 0.0  0.0 - 0.2 % Final   Performed at [facility], [address] [place], [place] [address]   Opiates 09/09/2023 NONE DETECTED  NONE DETECTED Final   Cocaine 09/09/2023 NONE DETECTED  NONE DETECTED Final   Benzodiazepines 09/09/2023 NONE DETECTED  NONE DETECTED Final   Amphetamines 09/09/2023 NONE DETECTED  NONE DETECTED Final   Tetrahydrocannabinol 09/09/2023  NONE DETECTED  NONE DETECTED Final   Barbiturates 09/09/2023 NONE DETECTED  NONE DETECTED Final   Comment: (NOTE) DRUG SCREEN FOR MEDICAL PURPOSES ONLY.  IF CONFIRMATION IS NEEDED FOR ANY PURPOSE, NOTIFY LAB WITHIN 5 DAYS.  LOWEST DETECTABLE LIMITS FOR URINE DRUG SCREEN Drug Class                     Cutoff (ng/mL) Amphetamine and metabolites    1000 Barbiturate and metabolites    200 Benzodiazepine  200 Opiates and metabolites        300 Cocaine and metabolites        300 THC                            50 Performed at [facility], [address] [place], [place] [address]    Valproic Acid Lvl 09/09/2023 <10 (L)  50.0 - 100.0 ug/mL Final   Comment: RESULT CONFIRMED BY MANUAL DILUTION Performed at [facility], [address] [place], [place] [address]   Admission on 05/25/2023, Discharged on 05/26/2023  Component Date Value Ref Range Status   WBC 05/25/2023 9.1  4.0 - 10.5 K/uL Final   RBC 05/25/2023 4.72  4.22 - 5.81 MIL/uL Final   Hemoglobin 05/25/2023 15.0  13.0 - 17.0 g/dL Final   HCT 30/86/5784 43.8  39.0 - 52.0 % Final   MCV 05/25/2023 92.8  80.0 - 100.0 fL Final   MCH 05/25/2023 31.8  26.0 - 34.0 pg Final   MCHC 05/25/2023 34.2  30.0 - 36.0 g/dL Final   RDW 69/62/9528 12.7  11.5 - 15.5 % Final   Platelets 05/25/2023 324  150 - 400 K/uL Final   nRBC 05/25/2023 0.0  0.0 - 0.2 % Final   Neutrophils Relative % 05/25/2023 56  % Final   Neutro Abs 05/25/2023 5.1  1.7 - 7.7 K/uL Final   Lymphocytes Relative 05/25/2023 36  % Final   Lymphs Abs 05/25/2023 3.2  0.7 - 4.0 K/uL Final   Monocytes Relative 05/25/2023 7  % Final   Monocytes Absolute 05/25/2023 0.6  0.1 - 1.0 K/uL Final   Eosinophils Relative 05/25/2023 0  % Final   Eosinophils Absolute 05/25/2023 0.0  0.0 - 0.5 K/uL Final   Basophils Relative 05/25/2023 1  % Final   Basophils Absolute 05/25/2023 0.1  0.0 - 0.1 K/uL Final   Immature Granulocytes 05/25/2023 0  % Final   Abs Immature  Granulocytes 05/25/2023 0.03  0.00 - 0.07 K/uL Final   Performed at [facility], [address], [place], [place] [address]   Sodium 05/25/2023 136  135 - 145 mmol/L Final   Potassium 05/25/2023 3.6  3.5 - 5.1 mmol/L Final   Chloride 05/25/2023 102  98 - 111 mmol/L Final   CO2 05/25/2023 21 (L)  22 - 32 mmol/L Final   Glucose, Bld 05/25/2023 108 (H)  70 - 99 mg/dL Final   Glucose reference range applies only to samples taken after fasting for at least 8 hours.   BUN 05/25/2023 7  6 - 20 mg/dL Final   Creatinine, Ser 05/25/2023 0.77  0.61 - 1.24 mg/dL Final   Calcium 40/07/2724 8.8 (L)  8.9 - 10.3 mg/dL Final   Total Protein 36/64/4034 7.9  6.5 - 8.1 g/dL Final   Albumin 74/25/9563 4.2  3.5 - 5.0 g/dL Final   AST 87/56/4332 20  15 - 41 U/L Final   ALT 05/25/2023 19  0 - 44 U/L Final   Alkaline Phosphatase 05/25/2023 49  38 - 126 U/L Final   Total Bilirubin 05/25/2023 0.8  0.3 - 1.2 mg/dL Final   GFR, Estimated 05/25/2023 >60  >60 mL/min Final   Comment: (NOTE) Calculated using the CKD-EPI Creatinine Equation (2021)    Anion gap 05/25/2023 13  5 - 15 Final   Performed at [facility], [address], [place], [place] [address]   Valproic  Acid Lvl 05/25/2023 61  50.0 - 100.0 ug/mL Final   Performed at [facility], [address], [place], [place] [address]   Alcohol, Ethyl (B) 05/25/2023 <10  <10 mg/dL Final   Comment: (NOTE) Lowest detectable limit for serum alcohol is 10 mg/dL.  For medical purposes only. Performed at [facility], [address], [place], [place] [address]    Opiates 05/25/2023 NONE DETECTED  NONE DETECTED Final   Cocaine 05/25/2023 NONE DETECTED  NONE DETECTED Final   Benzodiazepines 05/25/2023 NONE DETECTED  NONE DETECTED Final   Amphetamines 05/25/2023 NONE DETECTED  NONE DETECTED Final   Tetrahydrocannabinol 05/25/2023 NONE DETECTED  NONE DETECTED Final   Barbiturates 05/25/2023  NONE DETECTED  NONE DETECTED Final   Comment: (NOTE) DRUG SCREEN FOR MEDICAL PURPOSES ONLY.  IF CONFIRMATION IS NEEDED FOR ANY PURPOSE, NOTIFY LAB WITHIN 5 DAYS.  LOWEST DETECTABLE LIMITS FOR URINE DRUG SCREEN Drug Class                     Cutoff (ng/mL) Amphetamine and metabolites    1000 Barbiturate and metabolites    200 Benzodiazepine                 200 Opiates and metabolites        300 Cocaine and metabolites        300 THC                            50 Performed at [facility], [address], [place], [place] [address]    TSH 05/25/2023 2.109  0.350 - 4.500 uIU/mL Final   Comment: Performed by a 3rd Generation assay with a functional sensitivity of <=0.01 uIU/mL. Performed at [facility], [address], [place], [place] [address]    SARS Coronavirus 2 by RT PCR 05/25/2023 NEGATIVE  NEGATIVE Final   Comment: (NOTE) SARS-CoV-2 target nucleic acids are NOT DETECTED.  The SARS-CoV-2 RNA is generally detectable in upper and lower respiratory specimens during the acute phase of infection. The lowest concentration of SARS-CoV-2 viral copies this assay can detect is 250 copies / mL. A negative result does not preclude SARS-CoV-2 infection and should not be used as the sole basis for treatment or other patient management decisions.  A negative result may occur with improper specimen collection / handling, submission of specimen other than nasopharyngeal swab, presence of viral mutation(s) within the areas targeted by this assay, and inadequate number of viral copies (<250 copies / mL). A negative result must be combined with clinical observations, patient history, and epidemiological information.  Fact Sheet for Patients:   RoadLapTop.co.za  Fact Sheet for Healthcare Providers: http://kim-miller.com/  This test is not yet approved or                           cleared by the [place] FDA and has been authorized for detection and/or diagnosis of SARS-CoV-2 by FDA under an Emergency Use Authorization (EUA).  This EUA will remain in effect (meaning this test can be used) for the duration of the COVID-19 declaration under Section 564(b)(1) of the Act, 21 U.S.C. section 360bbb-3(b)(1), unless the authorization is terminated or revoked sooner.  Performed at [facility] [facility], [address], [place], [place] [address]     Allergies: Chicken allergy  Medications:  Facility Ordered Medications  Medication  acetaminophen (TYLENOL) tablet 650 mg   alum & mag hydroxide-simeth (MAALOX/MYLANTA) 200-200-20 MG/5ML suspension 30 mL   magnesium hydroxide (MILK OF MAGNESIA) suspension 30 mL   traZODone (DESYREL) tablet 50 mg   hydrOXYzine (ATARAX) tablet 25 mg   OLANZapine zydis (ZYPREXA) disintegrating tablet 10 mg   And   LORazepam (ATIVAN) tablet 1 mg   And   ziprasidone (GEODON) injection 20 mg   divalproex (DEPAKOTE) DR tablet 500 mg   QUEtiapine (SEROQUEL) tablet 200 mg   pantoprazole (PROTONIX) EC tablet 40 mg   PTA Medications  Medication Sig   omeprazole (PRILOSEC) 40 MG capsule Take 40 mg by mouth daily as needed (for heartburn or reflux).   QUEtiapine (SEROQUEL) 100 MG tablet Take 100 mg by mouth at bedtime. (Patient not taking: Reported on 09/09/2023)   divalproex (DEPAKOTE) 250 MG DR tablet Take by mouth.   divalproex (DEPAKOTE) 500 MG DR tablet Take 500 mg by mouth 2 (two) times daily.   QUEtiapine (SEROQUEL) 200 MG tablet Take 200 mg by mouth at bedtime.   ibuprofen (ADVIL) 200 MG tablet Take 200-800 mg by mouth every 6 (six) hours as needed for moderate pain (pain score 4-6).      Medical Decision Making    Patient presents to [facility] via [facility].  Patient is experiencing auditory hallucinations and is responding to internal/external stimuli.  Patient is recommended for inpatient psychiatric admission.  He will be admitted to the  continuous assessment unit while awaiting inpatient psychiatric bed availability.    Recommendations  Based on my evaluation the patient does not appear to have an emergency medical condition.  Patient is recommended for inpatient psychiatric admission.  Patient accepted to [facility] and can be transferred today.  Medications: Started Invega 6 mg daily- However, pt refused. States he will not take anything but Seroquel 100 at bedtime and Depakote 500 BID.   Lab Orders         CBC with Differential/Platelet         Comprehensive metabolic panel         Ethanol         Valproic acid level         Lipid panel         TSH         Hemoglobin A1c         POCT Urine Drug Screen - (I-Screen)      EKG   Ardis Hughs, NP 10/28/23  10:34 AM

## 2023-10-28 NOTE — Progress Notes (Signed)
Pt has been accepted to [facility] TODAY 10/28/2023 Bed assignment: Main campus  Pt meets inpatient criteria per: Vernard Gambles NP  Attending Physician will be Loni Beckwith, MD  Report can be called to: [phone] (this is a pager, please leave call-back number when giving report)  Pt can arrive anytime today   Care Team Notified: Vernard Gambles NP, Rona Ravens RN,   [place] Marsh Heckler LCSW-A   10/28/2023 11:42 AM
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
## 2023-10-28 NOTE — ED Notes (Addendum)
Patient appears to be increasingly anxious. He has ramped up his coping behavior and is making louder noises. Atarax 25 mg administered.

## 2023-10-28 NOTE — ED Notes (Addendum)
Patient d/c with all belongings to [facility] via [employer]. Patient A&O x 4, calm and cooperative. Patient denies SI, HI,VH. Patient is utilizing coping skills to control AH. Birdie Hopes, RN made ae=ware of residents coping mechanisms for controlling AH.

## 2023-10-28 NOTE — BH Assessment (Signed)
Comprehensive Clinical Assessment (CCA) Note   10/28/2023 Albert [name] [id number]  Disposition: Vernard Gambles, NP recommends inpatient hospitalization.   The patient demonstrates the following risk factors for suicide: Chronic risk factors for suicide include: psychiatric disorder of schizophrenia . Acute risk factors for suicide include: unemployment. Protective factors for this patient include: positive social support. Considering these factors, the overall suicide risk at this point appears to be low. Patient is not appropriate for outpatient follow up.   Albert [name] is a 47 year old male presenting to [facility] escorted by [employer]. Pt begin to state, "What am I going?" "These voices are saying they want to "keel" me." Pt specifically recalls these voices are telling him to spell "keel" instead of "kill". Pt endorses AH at this current time. Pt appears to be paranoid, anxious and is experiencing pressured speech throughout triage. Pt is currently taking Depakote and Saraquil as prescribed. Pt was discharged from OV (Dr. Darlys Gales prescribed medication) recently (unable to give a specific time). However, pt reports he has been feeling this way for the last few weeks. Pt recalls these symptoms have worsened overtime, but happened right after he discharged from OV. Pt denies substance use at this time, Si, Hi and Vh.  On evaluation, patient is alert, oriented x 3, and cooperative. Speech is disorganized. Pt has loose association thought process . Pt appears casual. Eye contact is fair. Mood is anxious, affect is congruent with mood. Pt denies SI/H/ VH. Pt reports auditory hallucinations.  There was indication that the patient is responding to internal stimuli. No delusions elicited during this assessment.      Chief Complaint:  Chief Complaint  Patient presents with   Paranoid   Visit Diagnosis: Schizoaffective Disorder      CCA Screening, Triage and Referral (STR)  Patient Reported  Information How did you hear about [place]? Legal System  What Is the Reason for Your Visit/Call Today? Albert [name] is a 47 year old male presenting to [facility] escorted by [employer]. Pt begin to state, "What am I going?" "These voices are saying they want to "keel" me." Pt specifically recalls these voices are telling him to spell "keel" instead of "kill". Pt endorses AH at this current time. Pt appears to be paranoid, anxious and is experiencing pressured speech throughout triage. Pt is currently taking Depakote and Saraquil as prescribed. Pt was discharged from OV (Dr. Darlys Gales prescribed medication) recently (unable to give a specific time). However, pt reports he has been feeling this way for the last few weeks. Pt recalls these symptoms have worsened overtime, but happened right after he discharged from OV. Pt denies substance use at this time, Si, Hi and Vh.  How Long Has This Been Causing You Problems? 1 wk - 1 month  What Do You Feel Would Help You the Most Today? Medication(s); Stress Management   Have You Recently Had Any Thoughts About Hurting Yourself? No  Are You Planning to Commit Suicide/Harm Yourself At This time? No   Flowsheet Row ED from 10/28/2023 in [facility] ED from 09/30/2023 in [facility] Emergency Department at [facility] ED from 09/09/2023 in [facility] Emergency Department at [facility]  C-SSRS RISK CATEGORY No Risk No Risk No Risk       Have you Recently Had Thoughts About Hurting Someone [name]? No  Are You Planning to Harm Someone at This Time? No  Explanation: Denies HI   Have You Used Any Alcohol or Drugs in the  Past 24 Hours? No  What Did You Use and How Much? N/A  Do You Currently Have a Therapist/Psychiatrist? Yes  Name of Therapist/Psychiatrist: Name of Therapist/Psychiatrist: Pt reports taking his medications but was unable to recall who prescribes his medicines.   Have You Been Recently Discharged From  Any Office Practice or Programs? No  Explanation of Discharge From Practice/Program: N/A     CCA Screening Triage Referral Assessment Type of Contact: Face-to-Face  Telemedicine Service Delivery:   Is this Initial or Reassessment?   Date Telepsych consult ordered in [facility]:    Time Telepsych consult ordered in [facility]:    Location of Assessment: [facility] Assessment Services  Provider Location: [facility] Assessment Services   Collateral Involvement: none   Does Patient Have a Automotive engineer Guardian? No  Legal Guardian Contact Information: n/a  Copy of Legal Guardianship Form: No - copy requested  Legal Guardian Notified of Arrival: -- (n/a)  Legal Guardian Notified of Pending Discharge: -- (n/a)  If Minor and Not Living with Parent(s), Who has Custody? n/a  Is CPS involved or ever been involved? Never  Is APS involved or ever been involved? Never   Patient Determined To Be At Risk for Harm To Self or Others Based on Review of Patient Reported Information or Presenting Complaint? No  Method: No Plan  Availability of Means: No access or NA  Intent: Vague intent or NA  Notification Required: No need or identified person  Additional Information for Danger to Others Potential: -- (n/a)  Additional Comments for Danger to Others Potential: n/a  Are There Guns or Other Weapons in Your Home? No  Types of Guns/Weapons: Pt denies access to guns/ weapons  Are These Weapons Safely Secured?                            No  Who Could Verify You Are Able To Have These Secured: Pt denies access to guns/ weapons  Do You Have any Outstanding Charges, Pending Court Dates, Parole/Probation? Denies pending legal charges  Contacted To Inform of Risk of Harm To Self or Others: -- (n/a)    Does Patient Present under Involuntary Commitment? No    [place] of Residence: [place]   Patient Currently Receiving the Following Services: Medication Management   Determination of  Need: Urgent (48 hours)   Options For Referral: Inpatient Hospitalization; Intensive Outpatient Therapy     CCA Biopsychosocial Patient Reported Schizophrenia/Schizoaffective Diagnosis in Past: Yes   Strengths: Pt is willing to seek treatment   Mental Health Symptoms Depression:  Change in energy/activity   Duration of Depressive symptoms: Duration of Depressive Symptoms: Greater than two weeks   Mania:  Change in energy/activity   Anxiety:   None   Psychosis:  Affective flattening/alogia/avolition; Hallucinations   Duration of Psychotic symptoms: Duration of Psychotic Symptoms: Greater than six months   Trauma:  None   Obsessions:  None   Compulsions:  None   Inattention:  N/A   Hyperactivity/Impulsivity:  N/A   Oppositional/Defiant Behaviors:  N/A   Emotional Irregularity:  None   Other Mood/Personality Symptoms:  na    Mental Status Exam Appearance and self-care  Stature:  Average   Weight:  Average weight   Clothing:  Neat/clean   Grooming:  Normal   Cosmetic use:  None   Posture/gait:  Normal   Motor activity:  Not Remarkable   Sensorium  Attention:  Distractible   Concentration:  Anxiety interferes   Orientation:  X5   Recall/memory:  Normal   Affect and Mood  Affect:  Appropriate   Mood:  Anxious   Relating  Eye contact:  Normal   Facial expression:  Anxious   Attitude toward examiner:  Cooperative   Thought and Language  Speech flow: Normal   Thought content:  Appropriate to Mood and Circumstances   Preoccupation:  -- (NA)   Hallucinations:  None (Pt was observed to seem to be responding to internal stimuli)   Organization:  Circumstantial   Company secretary of Knowledge:  Fair   Intelligence:  Average   Abstraction:  Functional   Judgement:  Impaired   Reality Testing:  Distorted; Adequate   Insight:  Fair; Gaps   Decision Making:  Confused   Social Functioning  Social Maturity:  -- (Pt was  unable or unwilling to answer questions at times due to psychotic symptoms.)   Social Judgement:  -- (Pt was unable or unwilling to answer questions at times due to psychotic symptoms.)   Stress  Stressors:  Family conflict   Coping Ability:  Overwhelmed   Skill Deficits:  Activities of daily living; Decision making; Responsibility; Self-care   Supports:  Family; Support needed     Religion: Religion/Spirituality Are You A Religious Person?: No How Might This Affect Treatment?: n/a  Leisure/Recreation: Leisure / Recreation Do You Have Hobbies?: Yes Leisure and Hobbies: Getting coffee, AA meetings, reading, journaling  Exercise/Diet: Exercise/Diet Do You Exercise?: No Have You Gained or Lost A Significant Amount of Weight in the Past Six Months?: No Do You Follow a Special Diet?: No Do You Have Any Trouble Sleeping?: No   CCA Employment/Education Employment/Work Situation: Employment / Work Systems developer: On disability Why is Patient on Disability: mental health disorder How Long has Patient Been on Disability: 10+ years Patient's Job has Been Impacted by Current Illness: No Has Patient ever Been in the [employer]?: No  Education: Education Is Patient Currently Attending School?: No Last Grade Completed:  (Pt was unable or unwilling to answer questions at times due to psychotic symptoms.) Did You Attend College?:  (Pt was unable or unwilling to answer questions at times due to psychotic symptoms.) Did You Have An Individualized Education Program (IIEP):  (Pt was unable or unwilling to answer questions at times due to psychotic symptoms.) Did You Have Any Difficulty At School?:  (Pt was unable or unwilling to answer questions at times due to psychotic symptoms.)   CCA Family/Childhood History Family and Relationship History: Family history Marital status: Single Does patient have children?: No  Childhood History:  Childhood History By whom  was/is the patient raised?: Mother Did patient suffer any verbal/emotional/physical/sexual abuse as a child?: No Did patient suffer from severe childhood neglect?: No Has patient ever been sexually abused/assaulted/raped as an adolescent or adult?: No Was the patient ever a victim of a crime or a disaster?: No Witnessed domestic violence?: No Has patient been affected by domestic violence as an adult?: No       CCA Substance Use Alcohol/Drug Use: Alcohol / Drug Use Pain Medications: See MAR Prescriptions: See MAR Over the Counter: See MAR History of alcohol / drug use?: Yes (Hx of polysubstance use per chart-Pt reports that he does not use any alcohol or substances at this time.) Longest period of sobriety (when/how long): Current 4 years Negative Consequences of Use:  (Hx of polysubstance use per chart-Pt reports that he does not use any alcohol  or substances at this time.) Withdrawal Symptoms:  (Hx of polysubstance use per chart-Pt reports that he does not use any alcohol or substances at this time.)                         ASAM's:  Six Dimensions of Multidimensional Assessment  Dimension 1:  Acute Intoxication and/or Withdrawal Potential:      Dimension 2:  Biomedical Conditions and Complications:      Dimension 3:  Emotional, Behavioral, or Cognitive Conditions and Complications:     Dimension 4:  Readiness to Change:     Dimension 5:  Relapse, Continued use, or Continued Problem Potential:     Dimension 6:  Recovery/Living Environment:     ASAM Severity Score:    ASAM Recommended Level of Treatment: ASAM Recommended Level of Treatment:  (Hx of polysubstance use per chart-Pt reports that he does not use any alcohol or substances at this time.)   Substance use Disorder (SUD) Substance Use Disorder (SUD)  Checklist Symptoms of Substance Use:  (Hx of polysubstance use per chart-Pt reports that he does not use any alcohol or substances at this  time.)  Recommendations for Services/Supports/Treatments: Recommendations for Services/Supports/Treatments Recommendations For Services/Supports/Treatments: Medication Management, Individual Therapy  Disposition Recommendation per psychiatric provider: We recommend inpatient psychiatric hospitalization. Patient is under voluntary admission status at this time; please IVC if attempts to leave hospital.   DSM5 Diagnoses: Patient Active Problem List   Diagnosis Date Noted   Adjustment disorder with disturbance of emotion 01/11/2020   Schizophrenia (HCC) 09/01/2019   Schizoaffective disorder (HCC) 01/23/2019   Paranoid schizophrenia (HCC) 06/05/2018   Alcohol use disorder, moderate, in sustained remission (HCC) 07/09/2017   Cannabis use disorder, moderate, in sustained remission (HCC) 07/09/2017   Elevated TSH 01/05/2017   Tachycardia 01/05/2017   Schizoaffective disorder, bipolar type (HCC) 01/04/2017   Alcoholism (HCC)    Drug abuse (HCC)    Medical non-compliance      Referrals to Alternative Service(s): Referred to Alternative Service(s):   Place:   Date:   Time:    Referred to Alternative Service(s):   Place:   Date:   Time:    Referred to Alternative Service(s):   Place:   Date:   Time:    Referred to Alternative Service(s):   Place:   Date:   Time:     Dava Najjar, MA,LCMHCA, NCC

## 2023-10-28 NOTE — Progress Notes (Signed)
LCSW Progress Note  [id number]   Albert [name]  10/28/2023  10:55 AM  Description:   Inpatient Psychiatric Referral  Patient was recommended inpatient per Vernard Gambles NP. There are no available beds at [facility], per [facility] Rona Ravens RN. Patient was referred to the following out of network facilities:    [facility] Provider Address Phone Fax  [facility] [address], [place] [address] [phone]  [facility] Patient Placement [facility] [place] [phone]  [facility] [address] [place] [address] [phone]  [facility] [address] [place] [address] [phone]  [facility] [address] [place], [place] [address] [phone]  [facility] [address] [place] [address] [phone]  [facility] [address] [address] [place] [address] [phone]  [facility] [address] [place] [address] [phone]  [facility] [address], [place] [address] [phone]  [facility] [address] [place] [address] [phone]  [facility] [place] [phone]  [facility] [address] [place] [address] [phone]  [facility] [address], [place] [address] [phone]  [facility] [address] [place] [address] [phone]  [facility] EFAX [address] [place], [place] [phone]  [facility] [address] [place]  [address] [phone]  [facility] [address], [place] [address] [phone]  [facility] [address] [place] [address] [phone]  [facility] (after hours) [address], [place] [address] [phone]  [facility] [address], [place] [address] [phone]      Situation ongoing, CSW to continue following and update chart as more information becomes available.      [place] Faiza Bansal, MSW, LCSW  10/28/2023 10:55 AM

## 2023-10-28 NOTE — Discharge Instructions (Signed)
pt has been accepted to [facility] TODAY 10/28/2023 Bed assignment: Main campus  Pt meets inpatient criteria per: Vernard Gambles NP  Attending Physician will be Loni Beckwith, MD  Report can be called to: [phone] (this is a pager, please leave call-back number when giving report)  Pt can arrive anytime today

## 2023-10-28 NOTE — ED Notes (Signed)
Report called to Birdie Hopes, RN at [facility]. [facility] made aware of need for transport to [facility]. [facility] will call when they are close to the facility.

## 2023-10-28 NOTE — ED Notes (Addendum)
Patient is A&O x 2-3 anxious, cooperative with a silly affect. Patient has a fixed smile on his face and laughs inappropriately at questions. Patient has a disorganized, loose association thought process. Patient's speech is soft and low. Patient denies SI, HI, AVH. Patient appears to be responsive to auditory internal stimuli r/t observed laughter and talking to unseen being.

## 2023-10-29 LAB — HEMOGLOBIN A1C
Hgb A1c MFr Bld: 5.6 % (ref 4.8–5.6)
Mean Plasma Glucose: 114 mg/dL

## 2024-01-18 ENCOUNTER — Other Ambulatory Visit: Payer: Self-pay

## 2024-01-18 ENCOUNTER — Emergency Department (HOSPITAL_COMMUNITY)
Admission: EM | Admit: 2024-01-18 | Discharge: 2024-01-18 | Disposition: A | Discharge status: Other Acute Inpatient Hospital | Attending: Emergency Medicine | Admitting: Emergency Medicine

## 2024-01-18 ENCOUNTER — Inpatient Hospital Stay
Admission: AD | Admit: 2024-01-18 | Discharge: 2024-01-28 | DRG: 885 | Disposition: A | Discharge status: Home / Self Care | Source: Intra-hospital | Attending: Psychiatry | Admitting: Psychiatry

## 2024-01-18 ENCOUNTER — Encounter: Payer: Self-pay | Admitting: Psychiatric/Mental Health

## 2024-01-18 ENCOUNTER — Encounter (HOSPITAL_COMMUNITY): Payer: Self-pay | Admitting: *Deleted

## 2024-01-18 DIAGNOSIS — Z818 Family history of other mental and behavioral disorders: Secondary | ICD-10-CM | POA: Diagnosis not present

## 2024-01-18 DIAGNOSIS — Z79899 Other long term (current) drug therapy: Secondary | ICD-10-CM | POA: Insufficient documentation

## 2024-01-18 DIAGNOSIS — Z6831 Body mass index (BMI) 31.0-31.9, adult: Secondary | ICD-10-CM

## 2024-01-18 DIAGNOSIS — E669 Obesity, unspecified: Secondary | ICD-10-CM | POA: Diagnosis present

## 2024-01-18 DIAGNOSIS — T43596A Underdosing of other antipsychotics and neuroleptics, initial encounter: Secondary | ICD-10-CM | POA: Diagnosis present

## 2024-01-18 DIAGNOSIS — Z91199 Patient's noncompliance with other medical treatment and regimen due to unspecified reason: Secondary | ICD-10-CM

## 2024-01-18 DIAGNOSIS — G4700 Insomnia, unspecified: Secondary | ICD-10-CM | POA: Diagnosis present

## 2024-01-18 DIAGNOSIS — Z87891 Personal history of nicotine dependence: Secondary | ICD-10-CM | POA: Diagnosis not present

## 2024-01-18 DIAGNOSIS — F309 Manic episode, unspecified: Secondary | ICD-10-CM | POA: Diagnosis not present

## 2024-01-18 DIAGNOSIS — F259 Schizoaffective disorder, unspecified: Principal | ICD-10-CM | POA: Diagnosis present

## 2024-01-18 DIAGNOSIS — F3010 Manic episode without psychotic symptoms, unspecified: Secondary | ICD-10-CM

## 2024-01-18 DIAGNOSIS — F250 Schizoaffective disorder, bipolar type: Secondary | ICD-10-CM | POA: Diagnosis not present

## 2024-01-18 DIAGNOSIS — R000 Tachycardia, unspecified: Secondary | ICD-10-CM | POA: Diagnosis present

## 2024-01-18 DIAGNOSIS — R450 Nervousness: Secondary | ICD-10-CM | POA: Diagnosis present

## 2024-01-18 LAB — COMPREHENSIVE METABOLIC PANEL
ALT: 19 U/L (ref 0–44)
AST: 38 U/L (ref 15–41)
Albumin: 4.4 g/dL (ref 3.5–5.0)
Alkaline Phosphatase: 46 U/L (ref 38–126)
Anion gap: 12 (ref 5–15)
BUN: 13 mg/dL (ref 6–20)
CO2: 23 mmol/L (ref 22–32)
Calcium: 9.3 mg/dL (ref 8.9–10.3)
Chloride: 105 mmol/L (ref 98–111)
Creatinine, Ser: 0.85 mg/dL (ref 0.61–1.24)
GFR, Estimated: >60 mL/min (ref >60)
Glucose, Bld: 98 mg/dL (ref 70–99)
Potassium: 3.7 mmol/L (ref 3.5–5.1)
Sodium: 140 mmol/L (ref 135–145)
Total Bilirubin: 1.2 mg/dL (ref 0.0–1.2)
Total Protein: 7.6 g/dL (ref 6.5–8.1)

## 2024-01-18 LAB — CBC WITH DIFFERENTIAL/PLATELET
Abs Immature Granulocytes: 0.03 10*3/uL (ref 0.00–0.07)
Basophils Absolute: 0.1 10*3/uL (ref 0.0–0.1)
Basophils Relative: 1 %
Eosinophils Absolute: 0.1 10*3/uL (ref 0.0–0.5)
Eosinophils Relative: 1 %
HCT: 45.2 % (ref 39.0–52.0)
Hemoglobin: 15.2 g/dL (ref 13.0–17.0)
Immature Granulocytes: 0 %
Lymphocytes Relative: 26 %
Lymphs Abs: 2.5 10*3/uL (ref 0.7–4.0)
MCH: 31.7 pg (ref 26.0–34.0)
MCHC: 33.6 g/dL (ref 30.0–36.0)
MCV: 94.2 fL (ref 80.0–100.0)
Monocytes Absolute: 0.9 10*3/uL (ref 0.1–1.0)
Monocytes Relative: 9 %
Neutro Abs: 6 10*3/uL (ref 1.7–7.7)
Neutrophils Relative %: 63 %
Platelets: 308 10*3/uL (ref 150–400)
RBC: 4.8 MIL/uL (ref 4.22–5.81)
RDW: 13.4 % (ref 11.5–15.5)
WBC: 9.6 10*3/uL (ref 4.0–10.5)
nRBC: 0 % (ref 0.0–0.2)

## 2024-01-18 LAB — ACETAMINOPHEN LEVEL: Acetaminophen (Tylenol), Serum: <10 ug/mL — ABNORMAL LOW (ref 10–30)

## 2024-01-18 LAB — RAPID URINE DRUG SCREEN, HOSP PERFORMED
Amphetamines: NOT DETECTED
Barbiturates: NOT DETECTED
Benzodiazepines: NOT DETECTED
Cocaine: NOT DETECTED
Opiates: NOT DETECTED
Tetrahydrocannabinol: NOT DETECTED

## 2024-01-18 LAB — VALPROIC ACID LEVEL: Valproic Acid Lvl: 16 ug/mL — ABNORMAL LOW (ref 50.0–100.0)

## 2024-01-18 LAB — SALICYLATE LEVEL: Salicylate Lvl: <7 mg/dL — ABNORMAL LOW (ref 7.0–30.0)

## 2024-01-18 LAB — ETHANOL: Alcohol, Ethyl (B): <10 mg/dL (ref <10)

## 2024-01-18 LAB — AMMONIA: Ammonia: 20 umol/L (ref 9–35)

## 2024-01-18 MED ORDER — TRAZODONE HCL 50 MG PO TABS
50.0000 mg | ORAL_TABLET | Freq: Every evening | ORAL | Status: DC | PRN
  Administered 2024-01-18 – 2024-01-27 (×3): 50 mg via ORAL
  Filled 2024-01-18 (×3): qty 1

## 2024-01-18 MED ORDER — DIVALPROEX SODIUM 500 MG PO DR TAB
500.0000 mg | DELAYED_RELEASE_TABLET | Freq: Once | ORAL | Status: AC
  Administered 2024-01-18: 500 mg via ORAL
  Filled 2024-01-18: qty 1

## 2024-01-18 MED ORDER — LORAZEPAM 1 MG PO TABS
1.0000 mg | ORAL_TABLET | ORAL | Status: DC | PRN
  Administered 2024-01-18 – 2024-01-19 (×2): 1 mg via ORAL
  Filled 2024-01-18 (×2): qty 1

## 2024-01-18 MED ORDER — HALOPERIDOL LACTATE 5 MG/ML IJ SOLN
5.0000 mg | Freq: Three times a day (TID) | INTRAMUSCULAR | Status: DC | PRN
  Filled 2024-01-18 (×2): qty 1

## 2024-01-18 MED ORDER — ARIPIPRAZOLE 5 MG PO TABS
15.0000 mg | ORAL_TABLET | Freq: Every day | ORAL | Status: DC
  Administered 2024-01-19: 15 mg via ORAL
  Filled 2024-01-18: qty 1

## 2024-01-18 MED ORDER — HALOPERIDOL LACTATE 5 MG/ML IJ SOLN: 10.0000 mg | Freq: Three times a day (TID) | INTRAMUSCULAR | Status: DC | PRN

## 2024-01-18 MED ORDER — STERILE WATER FOR INJECTION IJ SOLN
INTRAMUSCULAR | Status: AC
  Administered 2024-01-18: 1.2 mL
  Filled 2024-01-18: qty 10

## 2024-01-18 MED ORDER — ZIPRASIDONE MESYLATE 20 MG IM SOLR
20.0000 mg | Freq: Once | INTRAMUSCULAR | Status: AC
  Administered 2024-01-18: 20 mg via INTRAMUSCULAR
  Filled 2024-01-18: qty 20

## 2024-01-18 MED ORDER — QUETIAPINE FUMARATE 100 MG PO TABS: 200.0000 mg | ORAL_TABLET | Freq: Every day | ORAL | Status: DC

## 2024-01-18 MED ORDER — DIPHENHYDRAMINE HCL 50 MG/ML IJ SOLN
50.0000 mg | Freq: Three times a day (TID) | INTRAMUSCULAR | Status: DC | PRN
  Filled 2024-01-18: qty 1

## 2024-01-18 MED ORDER — ALUM & MAG HYDROXIDE-SIMETH 200-200-20 MG/5ML PO SUSP
30.0000 mL | ORAL | Status: DC | PRN
  Administered 2024-01-20 – 2024-01-25 (×4): 30 mL via ORAL
  Filled 2024-01-18 (×4): qty 30

## 2024-01-18 MED ORDER — HALOPERIDOL 5 MG PO TABS
5.0000 mg | ORAL_TABLET | Freq: Three times a day (TID) | ORAL | Status: DC | PRN
  Administered 2024-01-22: 5 mg via ORAL

## 2024-01-18 MED ORDER — LORAZEPAM 1 MG PO TABS: 1.0000 mg | ORAL_TABLET | ORAL | Status: DC | PRN

## 2024-01-18 MED ORDER — LORAZEPAM 2 MG/ML IJ SOLN
2.0000 mg | Freq: Three times a day (TID) | INTRAMUSCULAR | Status: DC | PRN
  Filled 2024-01-18: qty 1

## 2024-01-18 MED ORDER — DIVALPROEX SODIUM 500 MG PO DR TAB
500.0000 mg | DELAYED_RELEASE_TABLET | Freq: Two times a day (BID) | ORAL | Status: DC
Start: 2024-01-18 — End: 2024-01-18
  Administered 2024-01-18: 500 mg via ORAL
  Filled 2024-01-18: qty 2

## 2024-01-18 MED ORDER — ACETAMINOPHEN 325 MG PO TABS
650.0000 mg | ORAL_TABLET | Freq: Four times a day (QID) | ORAL | Status: DC | PRN
  Administered 2024-01-25 – 2024-01-27 (×4): 650 mg via ORAL
  Filled 2024-01-18 (×4): qty 2

## 2024-01-18 MED ORDER — LORAZEPAM 2 MG/ML IJ SOLN: 2.0000 mg | Freq: Three times a day (TID) | INTRAMUSCULAR | Status: DC | PRN

## 2024-01-18 MED ORDER — ARIPIPRAZOLE 5 MG PO TABS
15.0000 mg | ORAL_TABLET | Freq: Every day | ORAL | Status: DC
  Administered 2024-01-18: 15 mg via ORAL
  Filled 2024-01-18: qty 2

## 2024-01-18 MED ORDER — LORAZEPAM 2 MG/ML IJ SOLN
2.0000 mg | Freq: Once | INTRAMUSCULAR | Status: AC
  Administered 2024-01-18: 2 mg via INTRAMUSCULAR
  Filled 2024-01-18: qty 1

## 2024-01-18 MED ORDER — DIVALPROEX SODIUM 500 MG PO DR TAB
500.0000 mg | DELAYED_RELEASE_TABLET | Freq: Two times a day (BID) | ORAL | Status: DC
Start: 2024-01-19 — End: 2024-01-19
  Administered 2024-01-19 (×2): 500 mg via ORAL
  Filled 2024-01-18 (×2): qty 1

## 2024-01-18 MED ORDER — DIPHENHYDRAMINE HCL 25 MG PO CAPS: 50.0000 mg | ORAL_CAPSULE | Freq: Three times a day (TID) | ORAL | Status: DC | PRN

## 2024-01-18 MED ORDER — DIPHENHYDRAMINE HCL 50 MG/ML IJ SOLN: 50.0000 mg | Freq: Three times a day (TID) | INTRAMUSCULAR | Status: DC | PRN

## 2024-01-18 MED ORDER — MAGNESIUM HYDROXIDE 400 MG/5ML PO SUSP: 30.0000 mL | Freq: Every day | ORAL | Status: DC | PRN

## 2024-01-18 NOTE — ED Notes (Signed)
 Patient transported to [facility] via [employer]. All belongings sent with patient. Patient without questions at time of transport

## 2024-01-18 NOTE — Progress Notes (Signed)
 Patient has been denied by [facility] due to no appropriate beds available. Patient meets BH inpatient criteria per Starleen Blue, NP. Patient has been faxed out to the following facilities:   [facility] [address] [place] [address] [phone]  [facility] [address] [place] [address] [phone]  [facility] [address] [place] [address] [phone]  [facility] [address] [place], [place] [address] [phone]  [facility] Pl[facility], [place] [phone]  [facility] [address], [place] [address] [phone]  [facility] EFAX [address] [place] [phone]  [facility] [address] [place] [address] [phone]  [facility] [address] [place] [address] [phone]  [facility] [address] [place] [address] [phone]  [facility] [address] [place] [address] [phone]  [facility] [address] [facility] [place] [address] [phone]  [facility] [address] [place], [place] [address] [phone]  [facility] [address] [place] [address] [phone]  [facility] [address] [place] [address] [phone]  [facility] [address] [place] [address] [phone]  [facility] [address] [place] [address] [phone]  [facility] [address] [place] [address] [phone]   Damita Dunnings, MSW, LCSW-A  5:14 PM 01/18/2024

## 2024-01-18 NOTE — ED Triage Notes (Signed)
 The pt has not had any sleep for the past 2 days he appears manic or psychotic he answers questions quietly the  continues looking toward the ceiling and talks to some one or something  he reports that his sister is petra  in his chart Durwin Nora is his mother unable to determine which is which

## 2024-01-18 NOTE — ED Provider Notes (Signed)
 Blood pressure 118/63, pulse 71, temperature 97.6 F (36.4 C), temperature source Oral, resp. rate 14, height 5\' 10"  (1.778 m), weight 100 kg, SpO2 97%.    In short, Albert [name] is a 48 y.o. male with a chief complaint of Manic Behavior .  Refer to the original H&P for additional details.  07:57 PM  Patient accepted for admit to [facility]. Dr. Marval Regal is the accepting MD.     Maia Plan, MD 01/18/24 [phone]

## 2024-01-18 NOTE — ED Notes (Signed)
 Patient given Ativan and Geodon IM for agitation / non compliance with RN at triage .

## 2024-01-18 NOTE — ED Notes (Signed)
 Pt states that he hasn't taken his psych meds for a couple days. Triage RN

## 2024-01-18 NOTE — ED Provider Triage Note (Signed)
 Emergency Medicine Provider Triage Evaluation Note  Albert [name] , a 48 y.o. male  was evaluated in triage.  Pt complains of not being able to sleep and requesting to talk to someone.  He is rambling in triage and appears to be responding to internal stimuli and appears manic.  He is looking towards the ceiling and talking to someone who does not appear in the room.  Does have a history of schizoaffective disorder.  States he has not had his medications for several months.  Denies suicidal thoughts..  Review of Systems  Positive: Manic behavior Negative:   Physical Exam  BP (!) 156/99 (BP Location: Right Arm)   Pulse (!) 105   Temp 98.5 F (36.9 C) (Axillary)   Resp 18   Ht 5\' 10"  (1.778 m)   Wt 100 kg   SpO2 100%   BMI 31.63 kg/m  Gen:   Awake, no distress   Resp:  Normal effort  MSK:   Moves extremities without difficulty  Other:  Pressured speech, tangential speech  Medical Decision Making  Medically screening exam initiated at 3:49 AM.  Appropriate orders placed.  Albert [name] was informed that the remainder of the evaluation will be completed by another provider, this initial triage assessment does not replace that evaluation, and the importance of remaining in the ED until their evaluation is complete.  Sedation medications provided for patient and staff safety.  Labs to be obtained   Albert Octave, MD 01/18/24 [phone]

## 2024-01-18 NOTE — ED Provider Notes (Signed)
 Patient was seen previously for many behavior.  He has a history of schizoaffective.  He was medically cleared.  Accepted at [facility].  Dr. Marval Regal is the accepting MD. he was seen by Dr. Jacqulyn Bath in the ED earlier this evening.  EMTALA completed.  He is stable for transport.   Emillie Chasen A, PA-C 01/18/24 2113    Glyn Ade, MD 01/18/24 2312

## 2024-01-18 NOTE — Consult Note (Signed)
 [facility] Psychiatric Consult Initial  Patient Name: .Albert [name]  MRN: [id number]  DOB: 02/04/1976  Consult Order details:  Orders (From admission, onward)     Start     Ordered   01/18/24 0620  CONSULT TO CALL ACT TEAM       Ordering Provider: Zadie Rhine, MD  Provider:  (Not yet assigned)  Question:  Reason for Consult?  Answer:  Psych consult   01/18/24 0619             Mode of Visit: In person    Psychiatry Consult Evaluation  Service Date: January 18, 2024 LOS:  LOS: 0 days  Chief Complaint "I haven't slept for [name] couple days"  Primary Psychiatric Diagnoses  Schizoaffective disorder bipolar type  Assessment  Albert [name] is [name] 48 y.o. male admitted: Presented to the [facility] 01/18/2024  2:03 AM for presents due to not being able to sleep. He carries the psychiatric diagnoses of schizoaffective disorder bipolar type, substance abuse and paranoid schizophrenia and has [name] past medical history of GERD and dysphagia.   His current presentation of psychosis and mania is most consistent with schizoaffective disorder bipolar type. He meets criteria for inpatient hospitalization based on being acutely psychotic.  Current outpatient psychotropic medications include depakote and seroquel and historically he has had [name] moderate response to these medications. He was not compliant with medications prior to admission as evidenced by patient report that he hasn't taken them recently and low valproic acid level. On initial examination, patient is cooperative and psychotic. Please see plan below for detailed recommendations.   Diagnoses:  Active Hospital problems: Principal Problem:   Schizoaffective disorder, bipolar type (HCC)    Plan   ## Psychiatric Medication Recommendations:  Due to patient's elongated QTC, we will switch to abilify instead of restarting home seroquel --abilify 15mg  PO Q day --depakote 500mg  PO BID   ## Medical Decision Making Capacity: Not  specifically addressed in this encounter  ## Further Work-up:   -- most recent EKG on 01/18/2024 had QtC of 508 -- Pertinent labwork reviewed earlier this admission includes: CBC, CMP, acetaminophen, salicylate, alcohol, valproic acid level and UDS   ## Disposition:-- We recommend inpatient psychiatric hospitalization when medically cleared. Patient is under voluntary admission status at this time; please IVC if attempts to leave hospital.  ## Behavioral / Environmental: -Utilize compassion and acknowledge the patient's experiences while setting clear and realistic expectations for care.    ## Safety and Observation Level:  - Based on my clinical evaluation, I estimate the patient to be at low risk of self harm in the current setting. - At this time, we recommend  routine. This decision is based on my review of the chart including patient's history and current presentation, interview of the patient, mental status examination, and consideration of suicide risk including evaluating suicidal ideation, plan, intent, suicidal or self-harm behaviors, risk factors, and protective factors. This judgment is based on our ability to directly address suicide risk, implement suicide prevention strategies, and develop [name] safety plan while the patient is in the clinical setting. Please contact our team if there is [name] concern that risk level has changed.  CSSR Risk Category:C-SSRS RISK CATEGORY: No Risk  Suicide Risk Assessment: Patient has following modifiable risk factors for suicide: medication noncompliance, which we are addressing by recommending inpatient psychiatric hospitalization. Patient has following non-modifiable or demographic risk factors for suicide: male gender and psychiatric hospitalization Patient has the following protective factors against suicide: Access to outpatient  mental health care and Supportive family  Thank you for this consult request. Recommendations have been communicated to  the primary team.  We will continue to follow at this time.   Thomes Lolling, NP       History of Present Illness  Relevant Aspects of Hospital ED Course:  Admitted on 01/18/2024 for presents due to not being able to sleep. He carries the psychiatric diagnoses of schizoaffective disorder bipolar type, substance abuse and paranoid schizophrenia and has [name] past medical history of GERD and dysphagia.  Patient Report:  Albert [name], is seen face to face by this provider, consulted with Dr. Enedina Finner; and chart reviewed on 01/18/24.  On evaluation Albert [name] reports he has been unable to sleep for at least two days. Patient is acutely psychotic and manic.  Patient reports that he is working with [name] Agricultural consultant program named Albert [name] that "reacts to different stimuli" and gives the user sensual and sexual feelings.  He also says the program speaks to him when he is in the [employer] and says "I'm going to make them deny you."  He talked about his sponsor, Albert [name], being part of this program.  Patient's mother later states that Albert [name] is not [name] real person in patient's current life.  Patient talked about "getting [name] fill from the Albert [name] program in the [employer] parking lot" and that he has been told "don't sit in the sensors." He says he is supposed to tell his body and mind to ignore the input from sensors.  Patient reports that his psychiatrist also hears the Albert [name] program and is well aware of it's capabilities.  Patient was unable to articulate the point of this AI program.   Patient talked about going to meetings for sexaholics or people with sex addiction. When asked if he has been diagnosed with [name] sex addiction, patient turns away as if listening to an unseen other and then responds that he is not [name] sex addict.         During evaluation Albert [name] is seated on [name] stretcher in no acute distress.  He is alert & oriented x 4, cooperative and attentive for this assessment.  His mood is  concerned with congruent affect.  He has normal speech, and behavior.  Objectively there is evidence of psychosis, mania and delusional thinking. Pt does appear to be responding to internal or external stimuli.  Patient is able to converse with scattered and tangential statements.  He denies suicidal/self-harm/homicidal ideation; he is experiencing psychosis, and paranoia.    Psych ROS:  Depression: denies Anxiety:  endorses Mania (lifetime and current): yes and current Psychosis: (lifetime and current): yes and current  Collateral information:  Contacted Albert [name], Albert [name], at [phone] on 01/18/2024  Albert [name] confirmed patient has [name] long history with mental illness.  She confirmed he lives alone in his own place.  She also confirms he does see Dr Betti Cruz for psychiatry.  Patient does not see anyone for therapy.  She says patient does not consistently take his medication and she shared her frustration with the lack of mental health care options and support.  Albert [name] shared that patient stating Albert [name] is his sponsor and working on an [employer] with him is all hallucination.  She says often the patient will tell providers that she is deceased when psychotic.  Albert [name] would like to be updated regarding patient movement.     Review of Systems  Psychiatric/Behavioral:  Positive for hallucinations.  All other systems reviewed and are negative.    Psychiatric and Social History  Psychiatric History:  Information collected from patient and chart review.  Prev Dx/Sx: schizoaffective disorder bipolar type, substance abuse and paranoid schizophrenia Current Psych Provider: Dr Betti Cruz Home Meds (current): seroquel and depakote Previous Med Trials: unknown Therapy: none  Prior Psych Hospitalization: yes many  Prior Self Harm: denies Prior Violence: denies  Family Psych History: denies Family Hx suicide: denies  Social History:  Developmental Hx: WNL Educational Hx: unknown Occupational Hx:  disability Legal Hx: none noted Living Situation: Lives alone in own home Spiritual Hx: none noted Access to weapons/lethal means: denies   Substance History Alcohol: denies current use  Tobacco: denies current use  Illicit drugs: denies current use  Prescription drug abuse: denies current use  Rehab hx: denies  Exam Findings  Physical Exam:  Vital Signs:  Temp:  [98.1 F (36.7 C)-98.5 F (36.9 C)] 98.1 F (36.7 C) (03/22 0650) Pulse Rate:  [100-105] 100 (03/22 0650) Resp:  [15-18] 15 (03/22 0650) BP: (154-156)/(90-99) 154/90 (03/22 0650) SpO2:  [98 %-100 %] 98 % (03/22 0650) Weight:  [100 kg] 100 kg (03/22 0310) Blood pressure (!) 154/90, pulse 100, temperature 98.1 F (36.7 C), temperature source Oral, resp. rate 15, height 5\' 10"  (1.778 m), weight 100 kg, SpO2 98%. Body mass index is 31.63 kg/m.  Physical Exam Vitals and nursing note reviewed.  Eyes:     Pupils: Pupils are equal, round, and reactive to light.  Pulmonary:     Effort: Pulmonary effort is normal.  Skin:    General: Skin is dry.  Neurological:     Mental Status: He is alert and oriented to person, place, and time.  Psychiatric:        Attention and Perception: He perceives auditory and visual hallucinations.        Mood and Affect: Mood normal.        Speech: Speech normal.        Behavior: Behavior is cooperative.        Thought Content: Thought content is delusional.        Cognition and Memory: Cognition is impaired. Memory is impaired.     Mental Status Exam: General Appearance: Disheveled  Orientation:  Full (Time, Place, and Person)  Memory:  Immediate;   Poor Recent;   Poor Remote;   Poor  Concentration:  Concentration: Poor  Recall:  Poor  Attention  Poor  Eye Contact:  Good  Speech:  Clear and Coherent  Language:  Fair  Volume:  Normal  Mood: Concerned  Affect:  Congruent  Thought Process:  Disorganized  Thought Content:  Hallucinations: Auditory Visual  Suicidal Thoughts:   No  Homicidal Thoughts:  No  Judgement:  Impaired  Insight:  Lacking  Psychomotor Activity:  Restlessness  Akathisia:  No  Fund of Knowledge:  Fair      Assets:  Desire for Improvement Housing Leisure Time Social Support  Cognition:  WNL  ADL's:  Intact  AIMS (if indicated):        Other History   These have been pulled in through the EMR, reviewed, and updated if appropriate.  Family History:  The patient's family history includes Depression in his maternal aunt; Thyroid disease in his mother.  Medical History: Past Medical History:  Diagnosis Date   Alcoholism (HCC)    Bipolar disorder (HCC)    Drug abuse (HCC)    xanax addiction 5 years ago   Heartburn  Medical non-compliance    Paranoid schizophrenia (HCC)    Schizophrenia (HCC)    Seizures (HCC)     Surgical History: Past Surgical History:  Procedure Laterality Date   NO PAST SURGERIES       Medications:   Current Facility-Administered Medications:    ARIPiprazole (ABILIFY) tablet 15 mg, 15 mg, Oral, Daily, Albert Viviano A, NP, 15 mg at 01/18/24 1213   divalproex (DEPAKOTE) DR tablet 500 mg, 500 mg, Oral, BID, Zadie Rhine, MD, 500 mg at 01/18/24 4034   LORazepam (ATIVAN) tablet 1 mg, 1 mg, Oral, Q4H PRN, Zadie Rhine, MD  Current Outpatient Medications:    divalproex (DEPAKOTE) 500 MG DR tablet, Take 500 mg by mouth 2 (two) times daily., Disp: , Rfl:    ibuprofen (ADVIL) 200 MG tablet, Take 200-800 mg by mouth every 6 (six) hours as needed for moderate pain (pain score 4-6)., Disp: , Rfl:    omeprazole (PRILOSEC) 40 MG capsule, Take 40 mg by mouth daily as needed (for heartburn or reflux)., Disp: , Rfl:    QUEtiapine (SEROQUEL) 200 MG tablet, Take 200 mg by mouth at bedtime., Disp: , Rfl:   Allergies: Allergies  Allergen Reactions   Chicken Allergy Other (See Comments)    Pt reports he doesn't eat chicken    Thomes Lolling, NP

## 2024-01-18 NOTE — ED Notes (Signed)
 Patient refused to change to purple scrubs .

## 2024-01-18 NOTE — ED Provider Notes (Signed)
 [place] EMERGENCY DEPARTMENT AT [facility] Provider Note   CSN: [id number] Arrival date & time: 01/18/24  0159     History  Chief Complaint  Patient presents with   Manic Behavior   Level 5 caveat due to psychiatric disorder Albert [name] is a 48 y.o. male.  The history is provided by the patient.  Patient with history of schizoaffective disorder presents because he reports he feels scared he reports he has not slept in several days and he is concerned.  He is also not been taking any of his medications He denies any physical complaints   Past Medical History:  Diagnosis Date   Alcoholism (HCC)    Bipolar disorder (HCC)    Drug abuse (HCC)    xanax addiction 5 years ago   Heartburn    Medical non-compliance    Paranoid schizophrenia (HCC)    Schizophrenia (HCC)    Seizures (HCC)     Home Medications Prior to Admission medications   Medication Sig Start Date End Date Taking? Authorizing Provider  divalproex (DEPAKOTE) 500 MG DR tablet Take 500 mg by mouth 2 (two) times daily.    [provider]  ibuprofen (ADVIL) 200 MG tablet Take 200-800 mg by mouth every 6 (six) hours as needed for moderate pain (pain score 4-6).    [provider]  omeprazole (PRILOSEC) 40 MG capsule Take 40 mg by mouth daily as needed (for heartburn or reflux).    [provider]  QUEtiapine (SEROQUEL) 200 MG tablet Take 200 mg by mouth at bedtime.    [provider]      Allergies    Chicken allergy    Review of Systems   Review of Systems  Unable to perform ROS: Psychiatric disorder    Physical Exam Updated Vital Signs BP (!) 156/99 (BP Location: Right Arm)   Pulse (!) 105   Temp 98.5 F (36.9 C) (Axillary)   Resp 18   Ht 1.778 m (5\' 10" )   Wt 100 kg   SpO2 100%   BMI 31.63 kg/m  Physical Exam CONSTITUTIONAL: Resting comfortably, but overall disheveled HEAD: Normocephalic/atraumatic, no signs of trauma EYES:  EOMI/PERRL ENMT: Mucous membranes moist NECK: supple no meningeal signs CV: S1/S2 noted, no murmurs/rubs/gallops noted LUNGS: Lungs are clear to auscultation bilaterally, no apparent distress ABDOMEN: soft, nontender NEURO: Pt is awake/alert/appropriate, moves all extremitiesx4.  No facial droop.   PSYCH: Patient with pressured speech, tangential thought process  ED Results / Procedures / Treatments   Labs (all labs ordered are listed, but only abnormal results are displayed) Labs Reviewed  ACETAMINOPHEN LEVEL - Abnormal; Notable for the following components:      Result Value   Acetaminophen (Tylenol), Serum <10 (*)    All other components within normal limits  SALICYLATE LEVEL - Abnormal; Notable for the following components:   Salicylate Lvl <7.0 (*)    All other components within normal limits  VALPROIC ACID LEVEL - Abnormal; Notable for the following components:   Valproic Acid Lvl 16 (*)    All other components within normal limits  CBC WITH DIFFERENTIAL/PLATELET  COMPREHENSIVE METABOLIC PANEL  ETHANOL  RAPID URINE DRUG SCREEN, HOSP PERFORMED  AMMONIA    EKG None  Radiology No results found.  Procedures Procedures    Medications Ordered in ED Medications  LORazepam (ATIVAN) tablet 1 mg (has no administration in time range)  divalproex (DEPAKOTE) DR tablet 500 mg (has no administration in time range)  QUEtiapine (SEROQUEL)  tablet 200 mg (has no administration in time range)  LORazepam (ATIVAN) injection 2 mg (2 mg Intramuscular Given 01/18/24 0324)  ziprasidone (GEODON) injection 20 mg (20 mg Intramuscular Given 01/18/24 0324)  sterile water (preservative free) injection (1.2 mLs  Given 01/18/24 4098)    ED Course/ Medical Decision Making/ A&P Clinical Course as of 01/18/24 1191  Sat Jan 18, 2024  0623 Patient with history of schizoaffective disorder presents for multiple issues, he appears to be manic with pressured speech and tangential thought process.  He  does appear to be more calm at this time. Patient admits to nonadherence to his medication  Will plan for behavioral health evaluation [DW]    Clinical Course User Index [DW] Zadie Rhine, MD                                 Medical Decision Making Risk Prescription drug management.   The patient has been placed in psychiatric observation due to the need to provide a safe environment for the patient while obtaining psychiatric consultation and evaluation, as well as ongoing medical and medication management to treat the patient's condition.  The patient has not been placed under full IVC at this time.         Final Clinical Impression(s) / ED Diagnoses Final diagnoses:  Manic behavior [facility])    Rx / DC Orders ED Discharge Orders     None         Zadie Rhine, MD 01/18/24 [phone]

## 2024-01-18 NOTE — ED Notes (Signed)
 Breakfast tray ordered

## 2024-01-19 ENCOUNTER — Encounter: Payer: Self-pay | Admitting: Psychiatric/Mental Health

## 2024-01-19 DIAGNOSIS — F250 Schizoaffective disorder, bipolar type: Secondary | ICD-10-CM | POA: Diagnosis not present

## 2024-01-19 MED ORDER — CLONAZEPAM 0.125 MG PO TBDP
0.1250 mg | ORAL_TABLET | Freq: Two times a day (BID) | ORAL | Status: DC
Start: 2024-01-21 — End: 2024-01-21

## 2024-01-19 MED ORDER — QUETIAPINE FUMARATE 200 MG PO TABS
200.0000 mg | ORAL_TABLET | Freq: Every day | ORAL | Status: DC
  Administered 2024-01-19: 200 mg via ORAL
  Filled 2024-01-19: qty 1

## 2024-01-19 MED ORDER — CLONAZEPAM 0.25 MG PO TBDP
0.5000 mg | ORAL_TABLET | Freq: Two times a day (BID) | ORAL | Status: AC
  Filled 2024-01-19 (×2): qty 2

## 2024-01-19 MED ORDER — DIVALPROEX SODIUM 500 MG PO DR TAB
750.0000 mg | DELAYED_RELEASE_TABLET | Freq: Two times a day (BID) | ORAL | Status: DC
  Administered 2024-01-20 – 2024-01-28 (×17): 750 mg via ORAL
  Filled 2024-01-19 (×17): qty 1

## 2024-01-19 MED ORDER — CLONAZEPAM 0.25 MG PO TBDP
0.2500 mg | ORAL_TABLET | Freq: Two times a day (BID) | ORAL | Status: AC
  Filled 2024-01-19: qty 1

## 2024-01-19 NOTE — Group Note (Signed)
 Date:  01/19/2024 Time:  9:49 PM  Group Topic/Focus:  Wrap-Up Group:   The focus of this group is to help patients review their daily goal of treatment and discuss progress on daily workbooks.    Participation Level:  Did Not Attend   Katina Dung 01/19/2024, 9:49 PM

## 2024-01-19 NOTE — Progress Notes (Addendum)
 Patient denies SI/HI. Patient still with delusional thought process about "AI" and it controlling his thought process. Patient became upset during this shift regarding his seroquel being discontinued. Patient became argumentative with nurse practitioner. Verbal de-escalation was attempted by staff- unsuccessful. Patient was agreeable to take PRN medication- reference MAR. After administration of medication, patient calm, but still anxious about Seroquel. Patient reported he is worried how he will sleep at night without this medication. Medication adjustments made and patient updated by practitioner.

## 2024-01-19 NOTE — Tx Team (Signed)
 Initial Treatment Plan 01/19/2024 1:48 AM Albert [name] ZOX:[id number]    PATIENT STRESSORS: Health problems   Medication change or noncompliance     PATIENT STRENGTHS: Capable of independent living  Motivation for treatment/growth    PATIENT IDENTIFIED PROBLEMS: Maniac  Depression                   DISCHARGE CRITERIA:  Ability to meet basic life and health needs Reduction of life-threatening or endangering symptoms to within safe limits Verbal commitment to aftercare and medication compliance  PRELIMINARY DISCHARGE PLAN: Return to previous living arrangement Return to previous work or school arrangements  PATIENT/FAMILY INVOLVEMENT: This treatment plan has been presented to and reviewed with the patient, Albert [name].  The patient has been given the opportunity to ask questions and make suggestions.  Lahoma Crocker, RN 01/19/2024, 1:48 AM

## 2024-01-19 NOTE — [facility] Suicide Risk Assessment (Cosign Needed Addendum)
 [facility] Admission Suicide Risk Assessment   Nursing information obtained from:  Patient Demographic factors:  Male, Caucasian Current Mental Status:  NA Loss Factors:  NA Historical Factors:  Family history of mental illness or substance abuse, Impulsivity Risk Reduction Factors:  NA  Total Time spent with patient: 2.5 hours Principal Problem: Schizoaffective disorder, bipolar type (HCC) Diagnosis:  Principal Problem:   Schizoaffective disorder, bipolar type (HCC) Active Problems:   Medical non-compliance   Tachycardia  Subjective Data: 48 year old Caucasian male who presented to the ED at 2:03 AM 09/19/2024 reporting that he has not slept for at least two days. He appears acutely psychotic and manic, endorsing delusional thoughts related to an AI program he calls "MOM," which he believes communicates with him and provides sexual sensations. He reports interactions with the program occurring in public settings, such as [place] [employer], and references a sponsor named Aurther Loft Aiken--identified by the patient's mother as a hallucination.The patient repeatedly requested Seroquel, expressing frustration and anger when informed it would not be restarted due to QTc prolongation. He stated, "My mother has a bad heart--I don't," and "You're trying to ruin my life. I want my Seroquel." Despite being educated on the cardiac risks associated with Seroquel and his QTc of 508, the patient continued to request the medication. After consultation with the psychiatrist, he will be restarted on Seroquel 200 mg PO nightly with close monitoring.Patient denies suicidal ideation, homicidal ideation, or self-injurious behavior, but remains preoccupied with his delusions and demonstrates poor insight into his condition. His mother confirms noncompliance with outpatient medications and expressed concern over limited mental health support and the progression of his psychotic symptoms.  Continued Clinical Symptoms:    The  "Alcohol Use Disorders Identification Test", Guidelines for Use in Primary Care, Second Edition.  World Science writer [facility]). Score between 0-7:  no or low risk or alcohol related problems. Score between 8-15:  moderate risk of alcohol related problems. Score between 16-19:  high risk of alcohol related problems. Score 20 or above:  warrants further diagnostic evaluation for alcohol dependence and treatment.   CLINICAL FACTORS:   Alcohol/Substance Abuse/Dependencies Schizophrenia:   Paranoid or undifferentiated type Currently Psychotic Medical Diagnoses and Treatments/Surgeries   Musculoskeletal: Strength & Muscle Tone: within normal limits Gait & Station: normal Patient leans: N/A  Psychiatric Specialty Exam:  Presentation  General Appearance:  Neat; Appropriate for Environment  Eye Contact: Good (Cooperative but restless and preoccupied. Frequently redirects the conversation back to requesting Seroquel. Mildly agitated when redirection is attempted.)  Speech: Clear and Coherent; Pressured; Normal Rate (spontaneous with pressured quality at times.)  Speech Volume: Normal  Handedness: Left   Mood and Affect  Mood: Anxious  Affect: Flat; Non-Congruent; Labile (during discussion of medication.)   Thought Process  Thought Processes: Disorganized  Descriptions of Associations:Intact  Orientation:Full (Time, Place and Person)  Thought Content:Scattered; Paranoid Ideation  History of Schizophrenia/Schizoaffective disorder:Yes  Duration of Psychotic Symptoms:Greater than six months  Hallucinations:Hallucinations: None  Ideas of Reference:None  Suicidal Thoughts:Suicidal Thoughts: No  Homicidal Thoughts:Homicidal Thoughts: No   Sensorium  Memory: Immediate Fair; Recent Fair; Remote Fair  Judgment: Poor (Continues to request medication despite understanding associated risks.)  Insight: Poor (lacks awareness of psychosis and risks associated  with medication)   Executive Functions  Concentration: Poor  Attention Span: Fair  Recall: Fair  Fund of Knowledge: Good  Language: Good   Psychomotor Activity  Psychomotor Activity:Psychomotor Activity: Normal   Assets  Assets: Financial Resources/Insurance; Manufacturing systems engineer; Housing   Sleep  Sleep:Sleep: Poor Number of Hours of Sleep: 2    Physical Exam: Physical Exam Vitals and nursing note reviewed.  Constitutional:      Appearance: Normal appearance.  HENT:     Head: Normocephalic and atraumatic.     Nose: Nose normal.  Pulmonary:     Effort: Pulmonary effort is normal.  Musculoskeletal:        General: Normal range of motion.     Cervical back: Normal range of motion.  Neurological:     General: No focal deficit present.     Mental Status: He is alert. Mental status is at baseline.  Psychiatric:        Attention and Perception: Attention and perception normal.        Mood and Affect: Mood is anxious. Affect is labile and angry.        Speech: Speech is rapid and pressured.        Behavior: Behavior is hyperactive. Behavior is cooperative.        Thought Content: Thought content is paranoid.        Cognition and Memory: Cognition and memory normal.        Judgment: Judgment is impulsive and inappropriate.    Review of Systems  Neurological:  Positive for tremors.  Psychiatric/Behavioral:  The patient is nervous/anxious.   All other systems reviewed and are negative.  Blood pressure 104/65, pulse 69, temperature 97.8 F (36.6 C), resp. rate 16, height 5\' 10"  (1.778 m), weight 100 kg, SpO2 99%. Body mass index is 31.63 kg/m.   COGNITIVE FEATURES THAT CONTRIBUTE TO RISK:  Closed-mindedness    SUICIDE RISK:   Mild:  Suicidal ideation of limited frequency, intensity, duration, and specificity.  There are no identifiable plans, no associated intent, mild dysphoria and related symptoms, good self-control (both objective and subjective  assessment), few other risk factors, and identifiable protective factors, including available and accessible social support.  PLAN OF CARE:  15-minute checks for safety due to acute psychosis, impaired insight, and potential for behavioral escalation Monitor for extrapyramidal symptoms, sedation, and QTc prolongation Observe for signs of benzodiazepine withdrawal if taper initiated clonazepam 0.5 mg PO BID x 3days, then 0.25 mg PO BID x 2 days, for anxiety, tremors, agitation, insomnia, or seizures. then discontinue. taper initiated due to history of benzodiazepine use and risk for withdrawal. Will monitor closely for symptoms. Seroquel 200 mg PO QHS - restarted with patient consent after discussion of QTc prolongation risk (QTc 508 ms). Monitor EKG for changes. Depakote 1000 mg PO BID - for mood stabilization and management of affective symptom Valproic Acid Level - to assess therapeutic range and adherence. Vitamin B12 - to rule out deficiency contributing to psychiatric symptoms. Folate - to evaluate nutritional status and support neurocognitive function. Hemoglobin A1C - to monitor for metabolic side effects related to antipsychotic use (e.g., hyperglycemia, insulin resistance). I certify that inpatient services furnished can reasonably be expected to improve the patient's condition.   Myriam Forehand, NP 01/19/2024, 6:22 PM

## 2024-01-19 NOTE — Progress Notes (Signed)
   01/19/24 0200  Psych Admission Type (Psych Patients Only)  Admission Status Voluntary  Psychosocial Assessment  Eye Contact Brief  Facial Expression Grimacing  Affect Angry;Irritable;Preoccupied  Speech Argumentative;Pressured;Tangential  Interaction Intrusive;Arrogant  Motor Activity Fidgety  Appearance/Hygiene In scrubs;Disheveled  Behavior Characteristics Agitated;Impulsive  Mood Depressed;Irritable  Thought Process  Coherency Loose associations;Tangential;Flight of ideas;Disorganized  Content Paranoia;Delusions  Delusions Paranoid  Perception Depersonalization  Hallucination UTA  Judgment Impaired  Confusion Moderate  Danger to Self  Current suicidal ideation? Denies  Agreement Not to Harm Self Yes  Description of Agreement agrees not to harm self or others  Danger to Others  Danger to Others None reported or observed

## 2024-01-19 NOTE — Progress Notes (Signed)
 Patient is a 47 y.o. with history of schizoaffective disorder who was admitted maniac behaviors. patient reports that he feels scared, has been talking to God and has not slept in several days and he is concerned. Patient has also not been taking any of his medications. Patient was observed talking to himself and unseen people. Denies SI and HI. Patient took his medications now in bed appears asleep.

## 2024-01-19 NOTE — Plan of Care (Addendum)
  Problem: Education: Goal: Knowledge of [facility] Education information/materials will improve Outcome: Progressing Goal: Emotional status will improve Outcome: Not Progressing Goal: Mental status will improve Outcome: Progressing Goal: Verbalization of understanding the information provided will improve Outcome: Progressing

## 2024-01-19 NOTE — H&P (Cosign Needed Addendum)
 Psychiatric Admission Assessment Adult  Patient Identification: Albert [name] MRN:  [id number] Date of Evaluation:  01/19/2024 Chief Complaint:  Schizoaffective disorder (HCC) [F25.9] Principal Diagnosis: Schizoaffective disorder, bipolar type (HCC) Diagnosis:  Principal Problem:   Schizoaffective disorder, bipolar type (HCC) Active Problems:   Medical non-compliance   Tachycardia  History of Present Illness: 48 year old Caucasian male with a psychiatric history significant for schizoaffective disorder, bipolar type, paranoid schizophrenia, and alcohol dependence, who presented to the emergency department on 01/18/2024 at 2:03 AM due to inability to sleep for at least two days, along with agitation, psychosis, and manic symptoms.The patient is currently noncompliant with his outpatient medications (Depakote and Seroquel), which is supported by both his self-report and a low valproic acid level. He presents with disorganized thinking, tangential speech, and delusional content. He endorses belief in a computer AI program called "MOM," which he claims gives him sexual sensations and communicates with him in public places, such as [place]. He references a sponsor named "Dene Gentry," whom he believes is associated with this program; however, collateral from the patient's mother confirms that Dene Gentry is not a real individual and that these beliefs are consistent with his prior psychotic episodes.The patient denies suicidal or homicidal ideation but exhibits poor insight into his condition, is preoccupied with delusions, and has been repeatedly requesting Seroquel, despite being informed of QTc prolongation (QTc = ) and associated risks. He became verbally aggressive when informed that Seroquel would not be initially restarted, but later agreed to accept the cardiac risks after discussing with the psychiatrist.The patient also reported attending meetings for sex addiction but denied a  formal diagnosis. He acknowledged masturbating frequently and became irritable when questioned about this behavior. He remains alert and oriented x4, but displays manic energy, distractibility, and pressured speech. His mother voiced concern regarding lack of outpatient mental health support and requests to be kept informed of his status.Based on the current presentation, the patient meets criteria for inpatient psychiatric admission for acute stabilization of psychosis and mania, and treatment of noncompliance and impaired insight. Associated Signs/Symptoms: Depression Symptoms:  insomnia, psychomotor agitation, difficulty concentrating, impaired memory, loss of energy/fatigue, (Hypo) Manic Symptoms:  Distractibility, Impulsivity, Labiality of Mood, Sexually Inapproprite Behavior, Anxiety Symptoms:  Social Anxiety, Psychotic Symptoms:  Paranoia, PTSD Symptoms: Negative Total Time spent with patient: 2.5 hours  Past Psychiatric History: Schizophrenia  Is the patient at risk to self? No.  Has the patient been a risk to self in the past 6 months? No.  Has the patient been a risk to self within the distant past? No.  Is the patient a risk to others? Yes.    Has the patient been a risk to others in the past 6 months? No.  Has the patient been a risk to others within the distant past? No.   [place] Scale:  Flowsheet Row Admission (Current) from 01/18/2024 in [facility] INPATIENT BEHAVIORAL MEDICINE Most recent reading at 01/18/2024 10:30 PM ED from 01/18/2024 in [facility] Emergency Department at [facility] Most recent reading at 01/18/2024  3:10 AM ED from 10/28/2023 in [facility] Most recent reading at 10/28/2023  9:27 AM  C-SSRS RISK CATEGORY No Risk No Risk No Risk        Prior Inpatient Therapy: Yes.   If yes, describe [facility]  Prior Outpatient Therapy: Yes.   If yes, describe Dr Betti Cruz   Alcohol Screening: Patient refused Alcohol Screening  Tool: Yes Substance Abuse History in the last 12 months:  Yes.   Consequences of Substance Abuse: Negative Previous Psychotropic Medications: Yes  Psychological Evaluations: No  Past Medical History:  Past Medical History:  Diagnosis Date   Alcoholism (HCC)    Bipolar disorder (HCC)    Drug abuse (HCC)    xanax addiction 5 years ago   Heartburn    Medical non-compliance    Paranoid schizophrenia (HCC)    Schizophrenia (HCC)    Seizures (HCC)     Past Surgical History:  Procedure Laterality Date   NO PAST SURGERIES     Family History:  Family History  Problem Relation Age of Onset   Thyroid disease Mother    Depression Maternal Aunt    Family Psychiatric  History: see above Tobacco Screening:  Social History   Tobacco Use  Smoking Status Former   Current packs/day: 0.00   Types: Cigarettes   Quit date: 02/21/2022   Years since quitting: 1.9  Smokeless Tobacco Never  Tobacco Comments   He stopped smoking in April of 2023. He said that he used to smoke "a few packs per day."     BH Tobacco Counseling     Are you interested in Tobacco Cessation Medications?  No, patient refused Counseled patient on smoking cessation:  Yes Reason Tobacco Screening Not Completed: No value filed.       Social History:  Social History   Substance and Sexual Activity  Alcohol Use Not Currently   Comment: has been sober since June 4th     Social History   Substance and Sexual Activity  Drug Use Not Currently   Types: Marijuana, Benzodiazepines   Comment: THC: was smoking 1 ounce every day, last use: April & xanax use was 10 mg every day for "a few months"    Additional Social History: Marital status: Single Are you sexually active?: No Does patient have children?: No                         Allergies:   Allergies  Allergen Reactions   Chicken Allergy Other (See Comments)    Pt reports he doesn't eat chicken   Lab Results:  Results for orders placed or  performed during the hospital encounter of 01/18/24 (from the past 48 hours)  CBC with Differential     Status: None   Collection Time: 01/18/24  3:22 AM  Result Value Ref Range   WBC 9.6 4.0 - 10.5 K/uL   RBC 4.80 4.22 - 5.81 MIL/uL   Hemoglobin 15.2 13.0 - 17.0 g/dL   HCT 84.6 96.2 - 95.2 %   MCV 94.2 80.0 - 100.0 fL   MCH 31.7 26.0 - 34.0 pg   MCHC 33.6 30.0 - 36.0 g/dL   RDW 84.1 32.4 - 40.1 %   Platelets 308 150 - 400 K/uL   nRBC 0.0 0.0 - 0.2 %   Neutrophils Relative % 63 %   Neutro Abs 6.0 1.7 - 7.7 K/uL   Lymphocytes Relative 26 %   Lymphs Abs 2.5 0.7 - 4.0 K/uL   Monocytes Relative 9 %   Monocytes Absolute 0.9 0.1 - 1.0 K/uL   Eosinophils Relative 1 %   Eosinophils Absolute 0.1 0.0 - 0.5 K/uL   Basophils Relative 1 %   Basophils Absolute 0.1 0.0 - 0.1 K/uL   Immature Granulocytes 0 %   Abs Immature Granulocytes 0.03 0.00 - 0.07 K/uL    Comment: Performed at [facility], [address]  [place], [place] [address]  Comprehensive metabolic panel     Status: None   Collection Time: 01/18/24  3:22 AM  Result Value Ref Range   Sodium 140 135 - 145 mmol/L   Potassium 3.7 3.5 - 5.1 mmol/L   Chloride 105 98 - 111 mmol/L   CO2 23 22 - 32 mmol/L   Glucose, Bld 98 70 - 99 mg/dL    Comment: Glucose reference range applies only to samples taken after fasting for at least 8 hours.   BUN 13 6 - 20 mg/dL   Creatinine, Ser 4.25 0.61 - 1.24 mg/dL   Calcium 9.3 8.9 - 95.6 mg/dL   Total Protein 7.6 6.5 - 8.1 g/dL   Albumin 4.4 3.5 - 5.0 g/dL   AST 38 15 - 41 U/L   ALT 19 0 - 44 U/L   Alkaline Phosphatase 46 38 - 126 U/L   Total Bilirubin 1.2 0.0 - 1.2 mg/dL   GFR, Estimated >38 >75 mL/min    Comment: (NOTE) Calculated using the CKD-EPI Creatinine Equation (2021)    Anion gap 12 5 - 15    Comment: Performed at [facility], [address], [place], [place] [address]  Ethanol     Status: None   Collection Time: 01/18/24  3:22 AM  Result Value Ref Range   Alcohol,  Ethyl (B) <10 <10 mg/dL    Comment: (NOTE) Lowest detectable limit for serum alcohol is 10 mg/dL.  For medical purposes only. Performed at [facility], [address], [place], [place] [address]   Acetaminophen level     Status: Abnormal   Collection Time: 01/18/24  3:22 AM  Result Value Ref Range   Acetaminophen (Tylenol), Serum <10 (L) 10 - 30 ug/mL    Comment: (NOTE) Therapeutic concentrations vary significantly. A range of 10-30 ug/mL  may be an effective concentration for many patients. However, some  are best treated at concentrations outside of this range. Acetaminophen concentrations >150 ug/mL at 4 hours after ingestion  and >50 ug/mL at 12 hours after ingestion are often associated with  toxic reactions.  Performed at [facility], [address] [place], [place] [address]   Salicylate level     Status: Abnormal   Collection Time: 01/18/24  3:22 AM  Result Value Ref Range   Salicylate Lvl <7.0 (L) 7.0 - 30.0 mg/dL    Comment: Performed at [facility], [address] [place], [place] [address]  Valproic acid level     Status: Abnormal   Collection Time: 01/18/24  3:22 AM  Result Value Ref Range   Valproic Acid Lvl 16 (L) 50.0 - 100.0 ug/mL    Comment: Performed at [facility], [address] [place], [place] [address]  Ammonia     Status: None   Collection Time: 01/18/24  3:22 AM  Result Value Ref Range   Ammonia 20 9 - 35 umol/L    Comment: Performed at [facility], [address], [place], [place] [address]  Rapid urine drug screen (hospital performed)     Status: None   Collection Time: 01/18/24  3:27 AM  Result Value Ref Range   Opiates NONE DETECTED NONE DETECTED   Cocaine NONE DETECTED NONE DETECTED   Benzodiazepines NONE DETECTED NONE DETECTED   Amphetamines NONE DETECTED NONE DETECTED   Tetrahydrocannabinol NONE DETECTED NONE DETECTED   Barbiturates NONE DETECTED NONE DETECTED    Comment: (NOTE) DRUG SCREEN FOR  MEDICAL PURPOSES ONLY.  IF CONFIRMATION IS NEEDED FOR ANY PURPOSE, NOTIFY LAB WITHIN 5 DAYS.  LOWEST DETECTABLE LIMITS FOR URINE DRUG SCREEN Drug Class                     Cutoff (ng/mL) Amphetamine and metabolites    1000 Barbiturate and metabolites    200 Benzodiazepine                 200 Opiates and metabolites        300 Cocaine and metabolites        300 THC                            50 Performed at [facility], [address] [place], [place] [address]     Blood Alcohol level:  Lab Results  Component Value Date   [facility] <10 01/18/2024   ETH <10 10/28/2023    Metabolic Disorder Labs:  Lab Results  Component Value Date   HGBA1C 5.6 10/28/2023   MPG 114 10/28/2023   MPG 105.41 07/25/2022   Lab Results  Component Value Date   PROLACTIN 30.4 (H) 01/06/2017   PROLACTIN 13.1 01/05/2017   Lab Results  Component Value Date   CHOL 182 10/28/2023   TRIG 145 10/28/2023   HDL 38 (L) 10/28/2023   CHOLHDL 4.8 10/28/2023   VLDL 29 10/28/2023   LDLCALC 115 (H) 10/28/2023   LDLCALC 78 07/25/2022    Current Medications: Current Facility-Administered Medications  Medication Dose Route Frequency Provider Last Rate Last Admin   acetaminophen (TYLENOL) tablet 650 mg  650 mg Oral Q6H PRN Jearld Lesch, NP       alum & mag hydroxide-simeth (MAALOX/MYLANTA) 200-200-20 MG/5ML suspension 30 mL  30 mL Oral Q4H PRN Jearld Lesch, NP       Melene Muller ON 01/21/2024] clonazepam (KLONOPIN) disintegrating tablet 0.125 mg  0.125 mg Oral BID Myriam Forehand, NP       Melene Muller ON 01/20/2024] clonazePAM (KLONOPIN) disintegrating tablet 0.25 mg  0.25 mg Oral BID Myriam Forehand, NP       clonazePAM Scarlette Calico) disintegrating tablet 0.5 mg  0.5 mg Oral BID Myriam Forehand, NP       haloperidol (HALDOL) tablet 5 mg  5 mg Oral TID PRN Jearld Lesch, NP       And   diphenhydrAMINE (BENADRYL) capsule 50 mg  50 mg Oral TID PRN Jearld Lesch, NP       haloperidol lactate (HALDOL) injection 5  mg  5 mg Intramuscular TID PRN Jearld Lesch, NP       And   diphenhydrAMINE (BENADRYL) injection 50 mg  50 mg Intramuscular TID PRN Jearld Lesch, NP       And   LORazepam (ATIVAN) injection 2 mg  2 mg Intramuscular TID PRN Jearld Lesch, NP       haloperidol lactate (HALDOL) injection 10 mg  10 mg Intramuscular TID PRN Jearld Lesch, NP       And   diphenhydrAMINE (BENADRYL) injection 50 mg  50 mg Intramuscular TID PRN Jearld Lesch, NP       And   LORazepam (ATIVAN) injection 2 mg  2 mg Intramuscular TID PRN Jearld Lesch, NP       [START ON 01/20/2024] divalproex (DEPAKOTE) DR tablet 750 mg  750 mg Oral BID Myriam Forehand, NP  LORazepam (ATIVAN) tablet 1 mg  1 mg Oral Q4H PRN Jearld Lesch, NP   1 mg at 01/19/24 0934   magnesium hydroxide (MILK OF MAGNESIA) suspension 30 mL  30 mL Oral Daily PRN Jearld Lesch, NP       QUEtiapine (SEROQUEL) tablet 200 mg  200 mg Oral QHS Myriam Forehand, NP       traZODone (DESYREL) tablet 50 mg  50 mg Oral QHS PRN Jearld Lesch, NP   50 mg at 01/18/24 2330   PTA Medications: Medications Prior to Admission  Medication Sig Dispense Refill Last Dose/Taking   divalproex (DEPAKOTE) 500 MG DR tablet Take 500 mg by mouth 2 (two) times daily.      ibuprofen (ADVIL) 200 MG tablet Take 200-800 mg by mouth every 6 (six) hours as needed for moderate pain (pain score 4-6).      omeprazole (PRILOSEC) 40 MG capsule Take 40 mg by mouth daily as needed (for heartburn or reflux).      QUEtiapine (SEROQUEL) 200 MG tablet Take 200 mg by mouth at bedtime.       Musculoskeletal: Strength & Muscle Tone: within normal limits Gait & Station: normal Patient leans: N/A            Psychiatric Specialty Exam:  Presentation  General Appearance:  Neat; Appropriate for Environment  Eye Contact: Good (Cooperative but restless and preoccupied. Frequently redirects the conversation back to requesting Seroquel. Mildly agitated when  redirection is attempted.)  Speech: Clear and Coherent; Pressured; Normal Rate (spontaneous with pressured quality at times.)  Speech Volume: Normal  Handedness: Left   Mood and Affect  Mood: Anxious  Affect: Flat; Non-Congruent; Labile (during discussion of medication.)   Thought Process  Thought Processes: Disorganized  Duration of Psychotic Symptoms: 1 week Past Diagnosis of Schizophrenia or Psychoactive disorder: Yes  Descriptions of Associations:Intact  Orientation:Full (Time, Place and Person)  Thought Content:Scattered; Paranoid Ideation  Hallucinations:Hallucinations: None  Ideas of Reference:None  Suicidal Thoughts:Suicidal Thoughts: No  Homicidal Thoughts:Homicidal Thoughts: No   Sensorium  Memory: Immediate Fair; Recent Fair; Remote Fair  Judgment: Poor (Continues to request medication despite understanding associated risks.)  Insight: Poor (lacks awareness of psychosis and risks associated with medication)   Executive Functions  Concentration: Poor  Attention Span: Fair  Recall: Fair  Fund of Knowledge: Good  Language: Good   Psychomotor Activity  Psychomotor Activity: Psychomotor Activity: Normal   Assets  Assets: Financial Resources/Insurance; Communication Skills; Housing   Sleep  Sleep: Sleep: Poor Number of Hours of Sleep: 2    Physical Exam: Physical Exam Vitals and nursing note reviewed.  Constitutional:      Appearance: Normal appearance.  HENT:     Head: Normocephalic and atraumatic.     Nose: Nose normal.  Pulmonary:     Effort: Pulmonary effort is normal.  Musculoskeletal:        General: Normal range of motion.     Cervical back: Normal range of motion.  Neurological:     General: No focal deficit present.     Mental Status: He is alert. Mental status is at baseline.  Psychiatric:        Attention and Perception: Attention and perception normal.        Mood and Affect: Mood is elated.  Affect is flat.        Speech: Speech is rapid and pressured.        Behavior: Behavior is hyperactive. Behavior is cooperative.  Thought Content: Thought content is paranoid.        Cognition and Memory: Cognition and memory normal.        Judgment: Judgment is impulsive.    Review of Systems  Psychiatric/Behavioral:  The patient is nervous/anxious and has insomnia.   All other systems reviewed and are negative.  Blood pressure 104/65, pulse 69, temperature 97.8 F (36.6 C), resp. rate 16, height 5\' 10"  (1.778 m), weight 100 kg, SpO2 99%. Body mass index is 31.63 kg/m.  Treatment Plan Summary: Daily contact with patient to assess and evaluate symptoms and progress in treatment and Medication management 15-minute checks for safety due to acute psychosis, impaired insight, and potential for behavioral escalation Monitor for extrapyramidal symptoms, sedation, and QTc prolongation (EKG 3/24) Observe for signs of benzodiazepine withdrawal if taper initiated clonazepam 0.5 mg PO BID x 3days, then 0.25 mg PO BID x 2 days, for withdrawal symptoms,anxiety, tremors, agitation, insomnia, or seizures. taper initiated due to history of benzodiazepine use and risk for withdrawal. Will monitor closely for symptoms. then discontinue. Seroquel 200 mg PO QHS - restarted with patient consent after discussion of QTc prolongation risk (QTc 508 ms). Monitor EKG for changes. Depakote 1000 mg PO BID - for mood stabilization and management of affective symptom Valproic Acid Level - to assess therapeutic range and adherence. Vitamin B12 - to rule out deficiency contributing to psychiatric symptoms. Folate - to evaluate nutritional status and support neurocognitive function. Hemoglobin A1C - to monitor for metabolic side effects related to antipsychotic use (e.g., hyperglycemia, insulin resistance). Observation Level/Precautions:  Continuous Observation 15 minute checks Seizure  Laboratory:  Folic  Acid HbAIC Vitamin B-12  Psychotherapy:    Medications:    Consultations:    Discharge Concerns:    Estimated LOS:  Other:     Physician Treatment Plan for Primary Diagnosis: Schizoaffective disorder, bipolar type (HCC) Long Term Goal(s): Improvement in symptoms so as ready for discharge  Short Term Goals: Ability to identify changes in lifestyle to reduce recurrence of condition will improve, Ability to verbalize feelings will improve, Ability to disclose and discuss suicidal ideas, Ability to demonstrate self-control will improve, Ability to identify and develop effective coping behaviors will improve, Ability to maintain clinical measurements within normal limits will improve, Compliance with prescribed medications will improve, and Ability to identify triggers associated with substance abuse/mental health issues will improve  Physician Treatment Plan for Secondary Diagnosis: Principal Problem:   Schizoaffective disorder, bipolar type (HCC) Active Problems:   Medical non-compliance   Tachycardia  Long Term Goal(s): Improvement in symptoms so as ready for discharge  Short Term Goals: Ability to identify changes in lifestyle to reduce recurrence of condition will improve, Ability to verbalize feelings will improve, Ability to disclose and discuss suicidal ideas, Ability to demonstrate self-control will improve, Ability to identify and develop effective coping behaviors will improve, Ability to maintain clinical measurements within normal limits will improve, Compliance with prescribed medications will improve, and Ability to identify triggers associated with substance abuse/mental health issues will improve  I certify that inpatient services furnished can reasonably be expected to improve the patient's condition.    Myriam Forehand, NP 3/23/20256:22 PM

## 2024-01-19 NOTE — BHH Counselor (Signed)
 Adult Comprehensive Assessment  Patient ID: Albert [name], male   DOB: [birth year], 48 y.o.   MRN: [id number]  Information Source: Information source: Patient  Current Stressors:  Patient states their primary concerns and needs for treatment are:: The patient stated he did not take his medications for 2 days and did not sleep for 40 hours. Patient states their goals for this hospitilization and ongoing recovery are:: The patient stated to get the correct medication. Educational / Learning stressors: None reported. Employment / Job issues: None reported. Family Relationships: None reported. Financial / Lack of resources (include bankruptcy): None reported. Housing / Lack of housing: None reported. Physical health (include injuries & life threatening diseases): None reported. Social relationships: None reported. Substance abuse: None reported. Bereavement / Loss: The patient stated he loss his grandparents.  Living/Environment/Situation:  Living Arrangements: Alone Living conditions (as described by patient or guardian): The patient stated that it was a really nice house. Who else lives in the home?: The patient stated no one. How long has patient lived in current situation?: The patient stated since November. What is atmosphere in current home: Comfortable  Family History:  Marital status: Single Are you sexually active?: No Does patient have children?: No  Childhood History:  By whom was/is the patient raised?: Both parents Additional childhood history information: The patient stated that his parents separated and he saw less of his father. Description of patient's relationship with caregiver when they were a child: The patient stated that his never taught him anything and he took a lot of his anger out on his mom. Patient's description of current relationship with people who raised him/her: The patient stated that the relationships are better. How were you disciplined when  you got in trouble as a child/adolescent?: The patient stated whoopings. Does patient have siblings?: Yes Number of Siblings: 3 Description of patient's current relationship with siblings: The patient stated that he has a great relatonship with his sisters. Did patient suffer any verbal/emotional/physical/sexual abuse as a child?: No  Education:  Highest grade of school patient has completed: The patient stated high school. Currently a student?: No Learning disability?: Yes What learning problems does patient have?: Autism  Employment/Work Situation:   Employment Situation: On disability Why is Patient on Disability: The patient stated mental health. How Long has Patient Been on Disability: The patient stated since 2013. Patient's Job has Been Impacted by Current Illness: No What is the Longest Time Patient has Held a Job?: The patient stated 3 years. Where was the Patient Employed at that Time?: The patient stated [employer]. Has Patient ever Been in the [employer]?: No  Financial Resources:   Financial resources: [employer], Medicare, Medicaid Does patient have a representative payee or guardian?: No  Alcohol/Substance Abuse:   What has been your use of drugs/alcohol within the last 12 months?: None reported. If attempted suicide, did drugs/alcohol play a role in this?: Yes (The patient stated that he held a gun to his head in 1999 with thoughts of killing himself.) Alcohol/Substance Abuse Treatment Hx: Attends AA/NA If yes, describe treatment: The patient stated he is still in [place]. Has alcohol/substance abuse ever caused legal problems?: Yes (The patient stated that he got DWI's in 9147,8295, and 2001.)  Social Support System:   Patient's Community Support System: Fair Type of faith/religion: The patient stated catholic. How does patient's faith help to cope with current illness?: None reported.  Leisure/Recreation:   Do You Have Hobbies?: Yes Leisure and Hobbies: The patient  stated  coffee shops, AA meetings, journaling.  Strengths/Needs:   What is the patient's perception of their strengths?: None reported. Patient states these barriers may affect/interfere with their treatment: The patient stated getting his medication. Patient states these barriers may affect their return to the community: The patient stated getting his medication. Other important information patient would like considered in planning for their treatment: The patient stated that he needs his medication to sleep.  Discharge Plan:   Currently receiving community mental health services: Yes (From Whom) Patient states concerns and preferences for aftercare planning are: The patient stated that he see Dr. Betti Cruz at [facility] and would like to continue with them. Patient states they will know when they are safe and ready for discharge when: The patient stated when he gets correct meds. Does patient have access to transportation?: Yes Does patient have financial barriers related to discharge medications?: No Will patient be returning to same living situation after discharge?: Yes  Summary/Recommendations:   Summary and Recommendations (to be completed by the evaluator): The patient is a 48 y.o. male from [place] [facility] [place]) with history of schizoaffective disorder who was admitted maniac behaviors. patient reports that he feels scared, has been talking to God and has not slept in several days and he is concerned. Patient has also not been taking any of his medications. The patient stated that he is currently receiving disability, Medicare, and Medicaid. The patient denies access to guns. The patient denies drug and alcohol use and states he is currently in [place]. The patient stated that he is receiving services from [facility] and would like to continue. The patient denied consent forms. The patient has access to transport and housing. Recommendations include: crisis stabilization,  therapeutic milieu, encourage group attendance and participation, medication management for mood stabilization and development of comprehensive mental wellness.  Marshell Levan. 01/19/2024

## 2024-01-19 NOTE — Group Note (Signed)
 Date:  01/19/2024 Time:  7:14 PM  Group Topic/Focus:  Activity Group: The focus of the group is to promote activity for the patients and encourage them to go outside to the courtyard and get some fresh air and some exercise.    Participation Level:  Active  Participation Quality:  Appropriate  Affect:  Appropriate  Cognitive:  Appropriate  Insight: Appropriate  Engagement in Group:  Engaged  Modes of Intervention:  Activity  Additional Comments:    Albert [name] Albert [name] 01/19/2024, 7:14 PM

## 2024-01-19 NOTE — Group Note (Signed)
 Date:  01/19/2024 Time:  7:07 PM  Group Topic/Focus:  Goals Group:   The focus of this group is to help patients establish daily goals to achieve during treatment and discuss how the patient can incorporate goal setting into their daily lives to aide in recovery.    Participation Level:  Active  Participation Quality:  Appropriate  Affect:  Appropriate  Cognitive:  Appropriate  Insight: Appropriate  Engagement in Group:  Engaged  Modes of Intervention:  Activity  Additional Comments:    Albert [name] 01/19/2024, 7:07 PM

## 2024-01-19 NOTE — Plan of Care (Signed)
   Problem: Education: Goal: Knowledge of [facility] Education information/materials will improve Outcome: Progressing Goal: Emotional status will improve Outcome: Progressing Goal: Mental status will improve Outcome: Progressing

## 2024-01-20 LAB — HEMOGLOBIN A1C
Hgb A1c MFr Bld: 5.1 % (ref 4.8–5.6)
Mean Plasma Glucose: 99.67 mg/dL

## 2024-01-20 LAB — VITAMIN B12: Vitamin B-12: 230 pg/mL (ref 180–914)

## 2024-01-20 MED ORDER — QUETIAPINE FUMARATE 200 MG PO TABS
200.0000 mg | ORAL_TABLET | Freq: Every day | ORAL | Status: DC
  Administered 2024-01-20 – 2024-01-21 (×2): 200 mg via ORAL
  Filled 2024-01-20 (×2): qty 1

## 2024-01-20 NOTE — BH IP Treatment Plan (Signed)
 Interdisciplinary Treatment and Diagnostic Plan Update  01/20/2024 Time of Session: 10:07 AM Albert [name] MRN: [id number]  Principal Diagnosis: Schizoaffective disorder, bipolar type (HCC)  Secondary Diagnoses: Principal Problem:   Schizoaffective disorder, bipolar type (HCC) Active Problems:   Medical non-compliance   Tachycardia   Current Medications:  Current Facility-Administered Medications  Medication Dose Route Frequency Provider Last Rate Last Admin   acetaminophen (TYLENOL) tablet 650 mg  650 mg Oral Q6H PRN Jearld Lesch, NP       alum & mag hydroxide-simeth (MAALOX/MYLANTA) 200-200-20 MG/5ML suspension 30 mL  30 mL Oral Q4H PRN Jearld Lesch, NP   30 mL at 01/20/24 1409   [START ON 01/21/2024] clonazepam (KLONOPIN) disintegrating tablet 0.125 mg  0.125 mg Oral BID Myriam Forehand, NP       clonazePAM Scarlette Calico) disintegrating tablet 0.25 mg  0.25 mg Oral BID Myriam Forehand, NP       clonazePAM Scarlette Calico) disintegrating tablet 0.5 mg  0.5 mg Oral BID Myriam Forehand, NP       haloperidol (HALDOL) tablet 5 mg  5 mg Oral TID PRN Jearld Lesch, NP       And   diphenhydrAMINE (BENADRYL) capsule 50 mg  50 mg Oral TID PRN Jearld Lesch, NP       haloperidol lactate (HALDOL) injection 5 mg  5 mg Intramuscular TID PRN Jearld Lesch, NP       And   diphenhydrAMINE (BENADRYL) injection 50 mg  50 mg Intramuscular TID PRN Jearld Lesch, NP       And   LORazepam (ATIVAN) injection 2 mg  2 mg Intramuscular TID PRN Jearld Lesch, NP       haloperidol lactate (HALDOL) injection 10 mg  10 mg Intramuscular TID PRN Jearld Lesch, NP       And   diphenhydrAMINE (BENADRYL) injection 50 mg  50 mg Intramuscular TID PRN Jearld Lesch, NP       And   LORazepam (ATIVAN) injection 2 mg  2 mg Intramuscular TID PRN Jearld Lesch, NP       divalproex (DEPAKOTE) DR tablet 750 mg  750 mg Oral BID Myriam Forehand, NP   750 mg at 01/20/24 1022   magnesium hydroxide (MILK OF  MAGNESIA) suspension 30 mL  30 mL Oral Daily PRN Jearld Lesch, NP       traZODone (DESYREL) tablet 50 mg  50 mg Oral QHS PRN Jearld Lesch, NP   50 mg at 01/18/24 2330   PTA Medications: Medications Prior to Admission  Medication Sig Dispense Refill Last Dose/Taking   divalproex (DEPAKOTE) 500 MG DR tablet Take 500 mg by mouth 2 (two) times daily.      ibuprofen (ADVIL) 200 MG tablet Take 200-800 mg by mouth every 6 (six) hours as needed for moderate pain (pain score 4-6).      omeprazole (PRILOSEC) 40 MG capsule Take 40 mg by mouth daily as needed (for heartburn or reflux).      QUEtiapine (SEROQUEL) 200 MG tablet Take 200 mg by mouth at bedtime.       Patient Stressors: Health problems   Medication change or noncompliance    Patient Strengths: Capable of independent living  Motivation for treatment/growth   Treatment Modalities: Medication Management, Group therapy, Case management,  1 to 1 session with clinician, Psychoeducation, Recreational therapy.   Physician Treatment Plan for Primary Diagnosis: Schizoaffective disorder, bipolar type [facility]) Long Term  Goal(s): Improvement in symptoms so as ready for discharge   Short Term Goals: Ability to identify changes in lifestyle to reduce recurrence of condition will improve Ability to verbalize feelings will improve Ability to disclose and discuss suicidal ideas Ability to demonstrate self-control will improve Ability to identify and develop effective coping behaviors will improve Ability to maintain clinical measurements within normal limits will improve Compliance with prescribed medications will improve Ability to identify triggers associated with substance abuse/mental health issues will improve  Medication Management: Evaluate patient's response, side effects, and tolerance of medication regimen.  Therapeutic Interventions: 1 to 1 sessions, Unit Group sessions and Medication administration.  Evaluation of Outcomes: Not  Met  Physician Treatment Plan for Secondary Diagnosis: Principal Problem:   Schizoaffective disorder, bipolar type (HCC) Active Problems:   Medical non-compliance   Tachycardia  Long Term Goal(s): Improvement in symptoms so as ready for discharge   Short Term Goals: Ability to identify changes in lifestyle to reduce recurrence of condition will improve Ability to verbalize feelings will improve Ability to disclose and discuss suicidal ideas Ability to demonstrate self-control will improve Ability to identify and develop effective coping behaviors will improve Ability to maintain clinical measurements within normal limits will improve Compliance with prescribed medications will improve Ability to identify triggers associated with substance abuse/mental health issues will improve     Medication Management: Evaluate patient's response, side effects, and tolerance of medication regimen.  Therapeutic Interventions: 1 to 1 sessions, Unit Group sessions and Medication administration.  Evaluation of Outcomes: Not Met   RN Treatment Plan for Primary Diagnosis: Schizoaffective disorder, bipolar type (HCC) Long Term Goal(s): Knowledge of disease and therapeutic regimen to maintain health will improve  Short Term Goals: Ability to verbalize frustration and anger appropriately will improve, Ability to demonstrate self-control, Ability to participate in decision making will improve, Ability to verbalize feelings will improve, Ability to disclose and discuss suicidal ideas, Ability to identify and develop effective coping behaviors will improve, and Compliance with prescribed medications will improve  Medication Management: RN will administer medications as ordered by provider, will assess and evaluate patient's response and provide education to patient for prescribed medication. RN will report any adverse and/or side effects to prescribing provider.  Therapeutic Interventions: 1 on 1 counseling  sessions, Psychoeducation, Medication administration, Evaluate responses to treatment, Monitor vital signs and CBGs as ordered, Perform/monitor CIWA, COWS, AIMS and Fall Risk screenings as ordered, Perform wound care treatments as ordered.  Evaluation of Outcomes: Not Met   LCSW Treatment Plan for Primary Diagnosis: Schizoaffective disorder, bipolar type (HCC) Long Term Goal(s): Safe transition to appropriate next level of care at discharge, Engage patient in therapeutic group addressing interpersonal concerns.  Short Term Goals: Engage patient in aftercare planning with referrals and resources, Increase social support, Increase ability to appropriately verbalize feelings, Increase emotional regulation, Facilitate acceptance of mental health diagnosis and concerns, Facilitate patient progression through stages of change regarding substance use diagnoses and concerns, Identify triggers associated with mental health/substance abuse issues, and Increase skills for wellness and recovery  Therapeutic Interventions: Assess for all discharge needs, 1 to 1 time with Social worker, Explore available resources and support systems, Assess for adequacy in community support network, Educate family and significant other(s) on suicide prevention, Complete Psychosocial Assessment, Interpersonal group therapy.  Evaluation of Outcomes: Not Met   Progress in Treatment: Attending groups: Yes. and No. Participating in groups: Yes. and No. Taking medication as prescribed: Yes. Toleration medication: Yes. Family/Significant other contact made: No, will  contact:  CSW to contact once permission is granted.  Patient understands diagnosis: Yes. Discussing patient identified problems/goals with staff: Yes. Medical problems stabilized or resolved: Yes. Denies suicidal/homicidal ideation: Yes. Issues/concerns per patient self-inventory: No. Other: None  New problem(s) identified: No, Describe:  None  New Short  Term/Long Term Goal(s): detox, elimination of symptoms of psychosis, medication management for mood stabilization; elimination of SI thoughts; development of comprehensive mental wellness/sobriety plan.    Patient Goals:  "To clear my mind and relax my mind."  Discharge Plan or Barriers: CSW to assist in the development of appropriate discharge plan.    Reason for Continuation of Hospitalization: Aggression Anxiety Delusions  Depression Hallucinations [facility] Issues Suicidal ideation  Estimated Length of Stay: 1-7 days.   Last 3 [place] Suicide Severity Risk Score: Flowsheet Row Admission (Current) from 01/18/2024 in [facility] INPATIENT BE[facility]AL MEDICINE Most recent reading at 01/18/2024 10:30 PM ED from 01/18/2024 in [facility] Emergency Department at [facility] Most recent reading at 01/18/2024  3:10 AM ED from 10/28/2023 in [facility] Most recent reading at 10/28/2023  9:27 AM  C-SSRS RISK CATEGORY No Risk No Risk No Risk       Last PHQ 2/9 Scores:     No data to display          Scribe for Treatment Team: Lowry Ram, [place] 01/20/2024 3:08 PM

## 2024-01-20 NOTE — Group Note (Signed)
 Recreation Therapy Group Note   Group Topic:Coping Skills  Group Date: 01/20/2024 Start Time: 1100 End Time: 1140 Facilitators: Rosina Lowenstein, LRT, CTRS Location:  Craft Room  Group Description: Mind Map.  Patient was provided a blank template of a diagram with 32 blank boxes in a tiered system, branching from the center (similar to a bubble chart). LRT directed patients to label the middle of the diagram "Coping Skills". LRT and patients then came up with 8 different coping skills as examples. Pt were directed to record their coping skills in the 2nd tier boxes closest to the center.  Patients would then share their coping skills with the group as LRT wrote them out. LRT gave a handout of 99 different coping skills at the end of group.   Goal Area(s) Addressed: Patients will be able to define "coping skills". Patient will identify new coping skills.  Patient will increase communication.   Affect/Mood: Appropriate   Participation Level: Engaged   Participation Quality: Independent   Behavior: Calm and Cooperative   Speech/Thought Process: Coherent   Insight: Fair   Judgement: Fair    Modes of Intervention: Clarification, Education, Exploration, Guided Discussion, Worksheet, and Writing   Patient Response to Interventions:  Receptive   Education Outcome:  In group clarification offered    Clinical Observations/Individualized Feedback: Albert [name] was mostly active in their participation of session activities and group discussion. Pt did not interact with LRT or peers while in group, however completed the handout.    Plan: Continue to engage patient in RT group sessions 2-3x/week.   [address] LRT, CTRS 01/20/2024 1:18 PM

## 2024-01-20 NOTE — Progress Notes (Signed)
   01/20/24 0400  Psych Admission Type (Psych Patients Only)  Admission Status Voluntary  Psychosocial Assessment  Patient Complaints Irritability  Eye Contact Fair  Facial Expression Grimacing  Affect Irritable  Speech Argumentative  Interaction Demanding  Motor Activity Fidgety  Appearance/Hygiene In scrubs  Behavior Characteristics Anxious;Irritable  Aggressive Behavior  Effect No apparent injury  Thought Process  Coherency Loose associations  Content Paranoia  Delusions Paranoid  Perception Depersonalization  Hallucination None reported or observed  Judgment Poor  Confusion WDL  Danger to Self  Current suicidal ideation? Denies  Agreement Not to Harm Self Yes  Danger to Others  Danger to Others None reported or observed

## 2024-01-20 NOTE — Group Note (Signed)
 Date:  01/20/2024 Time:  10:57 AM  Group Topic/Focus:  Goals Group:   The focus of this group is to help patients establish daily goals to achieve during treatment and discuss how the patient can incorporate goal setting into their daily lives to aide in recovery.   Participation Level:  Active  Participation Quality:  Appropriate  Affect:  Appropriate  Cognitive:  Appropriate  Insight: Appropriate  Engagement in Group:  Engaged  Modes of Intervention:  Activity, Discussion, and Education  Additional Comments:    Albert [name] 01/20/2024, 10:57 AM

## 2024-01-20 NOTE — Group Note (Signed)
 Recreation Therapy Group Note   Group Topic:Health and Wellness  Group Date: 01/20/2024 Start Time: 1530 End Time: 1635 Facilitators: Rosina Lowenstein, LRT, CTRS Location: Courtyard  Group Description: [employer]. LRT and patients played games of basketball, drew with chalk, and played corn hole while outside in the courtyard while getting fresh air and sunlight. Music was being played in the background. LRT and peers conversed about different games they have played before, what they do in their free time and anything else that is on their minds. LRT encouraged pts to drink water after being outside, sweating and getting their heart rate up.  Goal Area(s) Addressed: Patient will build on frustration tolerance skills. Patients will partake in a competitive play game with peers. Patients will gain knowledge of new leisure interest/hobby.    Affect/Mood: N/A   Participation Level: Did not attend    Clinical Observations/Individualized Feedback: Patient did not attend group.   Plan: Continue to engage patient in RT group sessions 2-3x/week.   [address] LRT, CTRS 01/20/2024 5:25 PM

## 2024-01-20 NOTE — Group Note (Signed)
 Date:  01/20/2024 Time:  8:57 PM  Group Topic/Focus:  Wrap-Up Group:   The focus of this group is to help patients review their daily goal of treatment and discuss progress on daily workbooks.    Participation Level:  Active  Participation Quality:  Appropriate, Sharing, and Supportive  Affect:  Appropriate  Cognitive:  Appropriate  Insight: Appropriate  Engagement in Group:  Supportive  Modes of Intervention:  Discussion and Support  Additional Comments:     Belva Crome 01/20/2024, 8:57 PM

## 2024-01-20 NOTE — Group Note (Signed)
 LCSW Group Therapy Note   Group Date: 01/20/2024 Start Time: 1300 End Time: 1400   Type of Therapy and Topic:  Group Therapy: Challenging Core Beliefs  Participation Level:  Did Not Attend  Description of Group:  Patients were educated about core beliefs and asked to identify one harmful core belief that they have. Patients were asked to explore from where those beliefs originate. Patients were asked to discuss how those beliefs make them feel and the resulting behaviors of those beliefs. They were then be asked if those beliefs are true and, if so, what evidence they have to support them. Lastly, group members were challenged to replace those negative core beliefs with helpful beliefs.   Therapeutic Goals:   1. Patient will identify harmful core beliefs and explore the origins of such beliefs. 2. Patient will identify feelings and behaviors that result from those core beliefs. 3. Patient will discuss whether such beliefs are true. 4.  Patient will replace harmful core beliefs with helpful ones.  Summary of Patient Progress:  Patient did not attend.   Therapeutic Modalities: Cognitive Behavioral Therapy; Solution-Focused Therapy   Lowry Ram, LCSWA 01/20/2024  2:08 PM

## 2024-01-20 NOTE — Plan of Care (Signed)
   Problem: Education: Goal: Knowledge of [facility] Education information/materials will improve Outcome: Progressing Goal: Emotional status will improve Outcome: Progressing Goal: Mental status will improve Outcome: Progressing

## 2024-01-20 NOTE — Progress Notes (Signed)
 [facility] MD Progress Note  01/21/2024 10:20 AM Albert [name]  MRN:  [id number]   48 year old Caucasian male with a psychiatric history significant for schizoaffective disorder, bipolar type, paranoid schizophrenia, and alcohol dependence, who presented to the emergency department on 01/18/2024 at 2:03 AM due to inability to sleep for at least two days, along with agitation, psychosis, and manic symptoms.The patient is currently noncompliant with his outpatient medications (Depakote and Seroquel), which is supported by both his self-report and a low valproic acid level. He presents with disorganized thinking, tangential speech, and delusional content. He endorses belief in a computer AI program called "MOM," which he claims gives him sexual sensations and communicates with him in public places, such as [place]. He references a sponsor named "Dene Gentry," whom he believes is associated with this program; however, collateral from the patient's mother confirms that Dene Gentry is not a real individual and that these beliefs are consistent with his prior psychotic episodes.   Subjective: Patient's case discussed with multidisciplinary team, all vitals and notes were reviewed.  No behavioral outbursts reported overnight.  Patient is seen for reassessment, he presents with significant mood lability, tangential thought process, pressured and rapid speech, difficult to redirect, he is paranoid.  Appears to be restless, anxious.  States that he was admitted because " my brain goes into psychosis, I do not sleep, have schizophrenia and bipolar.  I stopped taking my medications, take Seroquel".  Reports that he has been on [place] LAI in the past, that he does not want to take this medication as it causes emotional blunting.  Reports that his goals for treatment are to " get some exercise, clear my mind, read my Bible...".  Patient intrusive throughout reassessment, seen during treatment team, difficult  to redirect, uncooperative.    A1c 5.1 B12 230 on lower end of normal, will replace   Principal Problem: Schizoaffective disorder, bipolar type (HCC) Diagnosis: Principal Problem:   Schizoaffective disorder, bipolar type (HCC) Active Problems:   Medical non-compliance   Tachycardia  Total Time spent with patient: 30 min   Past Psychiatric History: see H&P  Past Medical History:  Past Medical History:  Diagnosis Date   Alcoholism (HCC)    Bipolar disorder (HCC)    Drug abuse (HCC)    xanax addiction 5 years ago   Heartburn    Medical non-compliance    Paranoid schizophrenia (HCC)    Schizophrenia (HCC)    Seizures (HCC)     Past Surgical History:  Procedure Laterality Date   NO PAST SURGERIES     Family History:  Family History  Problem Relation Age of Onset   Thyroid disease Mother    Depression Maternal Aunt    Family Psychiatric  History:  see H&P Social History:  Social History   Substance and Sexual Activity  Alcohol Use Not Currently   Comment: has been sober since June 4th     Social History   Substance and Sexual Activity  Drug Use Not Currently   Types: Marijuana, Benzodiazepines   Comment: THC: was smoking 1 ounce every day, last use: April & xanax use was 10 mg every day for "a few months"    Social History   Socioeconomic History   Marital status: Single    Spouse name: Not on file   Number of children: Not on file   Years of education: Not on file   Highest education level: Not on file  Occupational History    Comment: Unclear  Tobacco Use   Smoking status: Former    Current packs/day: 0.00    Types: Cigarettes    Quit date: 02/21/2022    Years since quitting: 1.9   Smokeless tobacco: Never   Tobacco comments:    He stopped smoking in April of 2023. He said that he used to smoke "a few packs per day."   Vaping Use   Vaping status: Never Used  Substance and Sexual Activity   Alcohol use: Not Currently    Comment: has been sober  since June 4th   Drug use: Not Currently    Types: Marijuana, Benzodiazepines    Comment: THC: was smoking 1 ounce every day, last use: April & xanax use was 10 mg every day for "a few months"   Sexual activity: Not Currently  Other Topics Concern   Not on file  Social History Narrative   Pt reported that he lives in a house apartment.   Social Drivers of Corporate investment banker Strain: Low Risk  (05/26/2023)   [facility] from [facility]   Overall Financial Resource Strain (CARDIA)    Difficulty of Paying Living Expenses: Not hard at all  Food Insecurity: No Food Insecurity (01/19/2024)   Hunger Vital Sign    Worried About Running Out of Food in the Last Year: Never true    Ran Out of Food in the Last Year: Never true  Transportation Needs: No Transportation Needs (01/19/2024)   PRAPARE - Administrator, Civil Service (Medical): No    Lack of Transportation (Non-Medical): No  Physical Activity: Inactive (05/26/2023)   Received from [facility]   Exercise Vital Sign    Days of Exercise per Week: 0 days    Minutes of Exercise per Session: 0 min  Stress: Stress Concern Present (05/26/2023)   Received from [facility] of Occupational Health - Occupational Stress Questionnaire    Feeling of Stress : Rather much  Social Connections: Patient Unable To Answer (01/19/2024)   Social Connection and Isolation Panel [NHANES]    Frequency of Communication with Friends and Family: Patient unable to answer    Frequency of Social Gatherings with Friends and Family: Patient unable to answer    Attends Religious Services: Patient unable to answer    Active Member of Clubs or Organizations: Patient unable to answer    Attends Banker Meetings: Patient unable to answer    Marital Status: Patient unable to answer   Additional Social History:                         Sleep: Poor  Appetite:  Fair  Current Medications: Current  Facility-Administered Medications  Medication Dose Route Frequency Provider Last Rate Last Admin   acetaminophen (TYLENOL) tablet 650 mg  650 mg Oral Q6H PRN Jearld Lesch, NP       alum & mag hydroxide-simeth (MAALOX/MYLANTA) 200-200-20 MG/5ML suspension 30 mL  30 mL Oral Q4H PRN Durwin Nora, Rashaun M, NP   30 mL at 01/20/24 1409   clonazePAM (KLONOPIN) disintegrating tablet 0.5 mg  0.5 mg Oral TID Darianny Momon, PA-C       haloperidol (HALDOL) tablet 5 mg  5 mg Oral TID PRN Jearld Lesch, NP       And   diphenhydrAMINE (BENADRYL) capsule 50 mg  50 mg Oral TID PRN Jearld Lesch, NP       haloperidol  lactate (HALDOL) injection 5 mg  5 mg Intramuscular TID PRN Jearld Lesch, NP       And   diphenhydrAMINE (BENADRYL) injection 50 mg  50 mg Intramuscular TID PRN Jearld Lesch, NP       And   LORazepam (ATIVAN) injection 2 mg  2 mg Intramuscular TID PRN Jearld Lesch, NP       haloperidol lactate (HALDOL) injection 10 mg  10 mg Intramuscular TID PRN Jearld Lesch, NP       And   diphenhydrAMINE (BENADRYL) injection 50 mg  50 mg Intramuscular TID PRN Jearld Lesch, NP       And   LORazepam (ATIVAN) injection 2 mg  2 mg Intramuscular TID PRN Jearld Lesch, NP       divalproex (DEPAKOTE) DR tablet 750 mg  750 mg Oral BID Myriam Forehand, NP   750 mg at 01/21/24 1308   magnesium hydroxide (MILK OF MAGNESIA) suspension 30 mL  30 mL Oral Daily PRN Jearld Lesch, NP       QUEtiapine (SEROQUEL) tablet 200 mg  200 mg Oral QHS Rayce Brahmbhatt, PA-C   200 mg at 01/20/24 2156   traZODone (DESYREL) tablet 50 mg  50 mg Oral QHS PRN Jearld Lesch, NP   50 mg at 01/18/24 2330    Lab Results:  Results for orders placed or performed during the hospital encounter of 01/18/24 (from the past 48 hours)  Hemoglobin A1c     Status: None   Collection Time: 01/20/24  6:43 AM  Result Value Ref Range   Hgb A1c MFr Bld 5.1 4.8 - 5.6 %    Comment: (NOTE) Pre diabetes:           5.7%-6.4%  Diabetes:              >6.4%  Glycemic control for   <7.0% adults with diabetes    Mean Plasma Glucose 99.67 mg/dL    Comment: Performed at [facility], [address] [place], [place] [address]  Vitamin B12     Status: None   Collection Time: 01/20/24  6:47 AM  Result Value Ref Range   Vitamin B-12 230 180 - 914 pg/mL    Comment: (NOTE) This assay is not validated for testing neonatal or myeloproliferative syndrome specimens for Vitamin B12 levels. Performed at [facility], [address] [place], [place] [address]     Blood Alcohol level:  Lab Results  Component Value Date   [facility] <10 01/18/2024   ETH <10 10/28/2023    Metabolic Disorder Labs: Lab Results  Component Value Date   HGBA1C 5.1 01/20/2024   MPG 99.67 01/20/2024   MPG 114 10/28/2023   Lab Results  Component Value Date   PROLACTIN 30.4 (H) 01/06/2017   PROLACTIN 13.1 01/05/2017   Lab Results  Component Value Date   CHOL 182 10/28/2023   TRIG 145 10/28/2023   HDL 38 (L) 10/28/2023   CHOLHDL 4.8 10/28/2023   VLDL 29 10/28/2023   LDLCALC 115 (H) 10/28/2023   LDLCALC 78 07/25/2022    Physical Findings: AIMS:  , ,  ,  ,    CIWA:    COWS:     Musculoskeletal: Strength & Muscle Tone: within normal limits Gait & Station: normal Patient leans: N/A  Psychiatric Specialty Exam:  Presentation  General Appearance:  Appropriate for Environment  Eye Contact: Good  Speech: Pressured  Speech Volume: Increased  Handedness: Left   Mood and Affect  Mood: Anxious  Affect: Labile   Thought Process  Thought Processes: Disorganized  Descriptions of Associations:Tangential  Orientation:Full (Time, Place and Person)  Thought Content:Perseveration  History of Schizophrenia/Schizoaffective disorder:Yes  Duration of Psychotic Symptoms:Greater than six months  Hallucinations:Hallucinations: None  Ideas of Reference:None  Suicidal Thoughts:Suicidal  Thoughts: No  Homicidal Thoughts:Homicidal Thoughts: No   Sensorium  Memory: Immediate Fair; Recent Fair; Remote Fair  Judgment: Poor  Insight: Poor   Executive Functions  Concentration: Poor  Attention Span: Fair  Recall: [employer] of Knowledge: Fair  Language: Fair   Psychomotor Activity  Psychomotor Activity: Psychomotor Activity: Restlessness   Assets  Assets: Health and safety inspector; Housing; Social Support   Sleep  Sleep: Sleep: Poor    Physical Exam: Physical Exam Vitals and nursing note reviewed.  HENT:     Head: Normocephalic and atraumatic.  Eyes:     Extraocular Movements: Extraocular movements intact.  Pulmonary:     Effort: Pulmonary effort is normal.  Skin:    General: Skin is dry.  Neurological:     Mental Status: He is alert and oriented to person, place, and time.  Psychiatric:        Attention and Perception: Perception normal. He is inattentive.        Mood and Affect: Mood is anxious. Affect is labile.        Speech: Speech is rapid and pressured and tangential.        Behavior: Behavior is uncooperative and agitated.        Thought Content: Thought content is paranoid.        Cognition and Memory: Memory normal.        Judgment: Judgment is impulsive and inappropriate.    Review of Systems  Psychiatric/Behavioral:  The patient is nervous/anxious and has insomnia.   All other systems reviewed and are negative.  Blood pressure (!) 99/59, pulse 89, temperature 97.8 F (36.6 C), resp. rate 20, height 5\' 10"  (1.778 m), weight 100 kg, SpO2 96%. Body mass index is 31.63 kg/m.   Treatment Plan Summary:  1.    Safety and Monitoring:   --  Voluntary admission to inpatient psychiatric unit for safety, stabilization and treatment -- Daily contact with patient to assess and evaluate symptoms and progress in treatment -- Patient's case to be discussed in multi-disciplinary team meeting -- Observation Level : q15  minute checks -- Vital signs:  q12 hours -- Precautions: assault    2. Psychiatric Diagnoses and Treatment:    01/20/2024 -- Continue Depakote ER 750 mg twice daily for mood stabilization -- Increase Klonopin to 0.5 mg 3 times daily for psychomotor agitation -- Continue Seroquel 200 mg at bedtime for mood stabilization/psychosis -- Continue trazodone 50 mg at bedtime as needed for sleep -- Recheck EKG s/t prolonged Qtc update - EKG NSR QTC 440  --  The risks/benefits/side-effects/alternatives to this medication were discussed in detail with the patient and time was given for questions. The patient consents to medication trial. -- Metabolic profile and EKG monitoring obtained while on an atypical antipsychotic  -- Encouraged patient to participate in unit milieu and in scheduled group therapies -- Short Term Goals: Ability to identify changes in lifestyle to reduce recurrence of condition will improve, Ability to verbalize feelings will improve, Ability to disclose and discuss suicidal ideas, Ability to demonstrate self-control will improve, Ability to identify and develop effective coping behaviors will improve, Ability to maintain clinical measurements within normal  limits will improve, Compliance with prescribed medications will improve, and Ability to identify triggers associated with substance abuse/mental health issues will improve -- Long Term Goals: Improvement in symptoms so as ready for discharge        3. Medical Issues Being Addressed:   None    4. Discharge Planning:   -- Social work and case management to assist with discharge planning and identification of hospital follow-up needs prior to discharge -- Estimated LOS: 5-7 days -- Discharge Concerns: Need to establish a safety plan; Medication compliance and effectiveness -- Discharge Goals: Return home with outpatient referrals for mental health follow-up including medication management/psychotherapy    Judeth Cornfield  Talley Casco, PA-C 01/21/2024, 10:20 AM

## 2024-01-20 NOTE — Progress Notes (Signed)
   01/20/24 1100  Psych Admission Type (Psych Patients Only)  Admission Status Voluntary  Psychosocial Assessment  Patient Complaints Irritability  Eye Contact Fair  Facial Expression Anxious;Worried  Affect Appropriate to circumstance  Speech Argumentative;Pressured;Sports administrator  Appearance/Hygiene In scrubs  Behavior Characteristics Anxious  Mood Irritable  Thought Process  Coherency Tangential  Content Blaming others  Delusions Paranoid  Perception Depersonalization  Hallucination None reported or observed  Judgment Poor  Confusion WDL  Danger to Self  Current suicidal ideation? Denies  Agreement Not to Harm Self Yes  Description of Agreement verbal

## 2024-01-21 MED ORDER — CLONAZEPAM 0.25 MG PO TBDP
0.5000 mg | ORAL_TABLET | Freq: Three times a day (TID) | ORAL | Status: AC
  Filled 2024-01-21: qty 2

## 2024-01-21 NOTE — Progress Notes (Signed)
   01/20/24 2229  Psych Admission Type (Psych Patients Only)  Admission Status Voluntary  Psychosocial Assessment  Patient Complaints Irritability  Eye Contact Fair  Facial Expression Grimacing  Affect Irritable  Speech Argumentative  Interaction Demanding  Motor Activity Fidgety  Appearance/Hygiene In scrubs  Behavior Characteristics Anxious  Mood Irritable  Aggressive Behavior  Effect No apparent injury  Thought Process  Coherency Loose associations  Content Paranoia  Delusions Paranoid  Perception Depersonalization  Hallucination None reported or observed  Judgment Poor  Confusion WDL  Danger to Self  Current suicidal ideation? Denies  Agreement Not to Harm Self Yes  Danger to Others  Danger to Others None reported or observed

## 2024-01-21 NOTE — Group Note (Signed)
 Recreation Therapy Group Note   Group Topic:General Recreation  Group Date: 01/21/2024 Start Time: 1430 End Time: 1535 Facilitators: Rosina Lowenstein, LRT, CTRS Location: Courtyard  Group Description: [employer]. LRT and patients played games of basketball, drew with chalk, and played corn hole while outside in the courtyard while getting fresh air and sunlight. Music was being played in the background. LRT and peers conversed about different games they have played before, what they do in their free time and anything else that is on their minds. LRT encouraged pts to drink water after being outside, sweating and getting their heart rate up.  Goal Area(s) Addressed: Patient will build on frustration tolerance skills. Patients will partake in a competitive play game with peers. Patients will gain knowledge of new leisure interest/hobby.    Affect/Mood: Full range   Participation Level: Active   Participation Quality: Independent   Behavior: Bizarre   Speech/Thought Process: Loose association   Insight: Limited   Judgement: Limited   Modes of Intervention: Activity   Patient Response to Interventions:  Receptive   Education Outcome:  In group clarification offered    Clinical Observations/Individualized Feedback: Albert [name] was active in their participation of session activities and group discussion. Pt minimally interacted with LRT and peers while present.    Plan: Continue to engage patient in RT group sessions 2-3x/week.   [address] LRT, CTRS 01/21/2024 5:02 PM

## 2024-01-21 NOTE — Group Note (Signed)
 [facility] LCSW Group Therapy Note   Group Date: 01/21/2024 Start Time: 1300 End Time: 1400  Type of Therapy/Topic:  Group Therapy:  Feelings about Diagnosis  Participation Level:  None   Description of Group:    This group will allow patients to explore their thoughts and feelings about diagnoses they have received. Patients will be guided to explore their level of understanding and acceptance of these diagnoses. Facilitator will encourage patients to process their thoughts and feelings about the reactions of others to their diagnosis, and will guide patients in identifying ways to discuss their diagnosis with significant others in their lives. This group will be process-oriented, with patients participating in exploration of their own experiences as well as giving and receiving support and challenge from other group members.   Therapeutic Goals: 1. Patient will demonstrate understanding of diagnosis as evidence by identifying two or more symptoms of the disorder:  2. Patient will be able to express two feelings regarding the diagnosis 3. Patient will demonstrate ability to communicate their needs through discussion and/or role plays  Summary of Patient Progress: Patient attended group, however did not participate in discussion. Instead patient was observed to be reading his book throughout group, out loud.   Therapeutic Modalities:   Cognitive Behavioral Therapy Brief Therapy Feelings Identification    Harden Mo, LCSW

## 2024-01-21 NOTE — Progress Notes (Signed)
 [facility] MD Progress Note  01/22/2024 12:19 AM Albert [name]  MRN:  [id number]   48 year old Caucasian male with a psychiatric history significant for schizoaffective disorder, bipolar type, paranoid schizophrenia, and alcohol dependence, who presented to the emergency department on 01/18/2024 at 2:03 AM due to inability to sleep for at least two days, along with agitation, psychosis, and manic symptoms.The patient is currently noncompliant with his outpatient medications (Depakote and Seroquel), which is supported by both his self-report and a low valproic acid level. He presents with disorganized thinking, tangential speech, and delusional content. He endorses belief in a computer AI program called "MOM," which he claims gives him sexual sensations and communicates with him in public places, such as [place]. He references a sponsor named "Dene Gentry," whom he believes is associated with this program; however, collateral from the patient's mother confirms that Dene Gentry is not a real individual and that these beliefs are consistent with his prior psychotic episodes.   Subjective: Patient's case discussed with multidisciplinary team, all vitals and notes were reviewed.  No behavioral outbursts reported overnight.  Patient is seen for reassessment, reports that he is doing better.  Speech continues to be pressured, improved with normal rate today.  Continues to be paranoid, perseverating on medication regimen.  Has declined any modifications in medication regimen.  Declined oral replacement of vitamin B12, has refused Klonopin which was ordered for psychomotor agitation. Patient guarded, evasive throughout reassessment, seen during treatment team, difficult to redirect, uncooperative.  He is denying any psychiatric symptoms.  States that Seroquel and Depakote are the best medications for him.  Patient with bizarre behaviors, keeps sniffling deeply, when questioned about this he states that "  everything is fine, if it is nothing is broken then it does not need to be fixed".  Per staff, patient continues to be paranoid, thinks that staff is making fun of him and begin about him.  A1c 5.1 B12 230 on lower end of normal, will replace -patient declines oral replacement   Principal Problem: Schizoaffective disorder, bipolar type (HCC) Diagnosis: Principal Problem:   Schizoaffective disorder, bipolar type (HCC) Active Problems:   Medical non-compliance   Tachycardia  Total Time spent with patient: 30 min   Past Psychiatric History: see H&P  Past Medical History:  Past Medical History:  Diagnosis Date   Alcoholism (HCC)    Bipolar disorder (HCC)    Drug abuse (HCC)    xanax addiction 5 years ago   Heartburn    Medical non-compliance    Paranoid schizophrenia (HCC)    Schizophrenia (HCC)    Seizures (HCC)     Past Surgical History:  Procedure Laterality Date   NO PAST SURGERIES     Family History:  Family History  Problem Relation Age of Onset   Thyroid disease Mother    Depression Maternal Aunt    Family Psychiatric  History:  see H&P Social History:  Social History   Substance and Sexual Activity  Alcohol Use Not Currently   Comment: has been sober since June 4th     Social History   Substance and Sexual Activity  Drug Use Not Currently   Types: Marijuana, Benzodiazepines   Comment: THC: was smoking 1 ounce every day, last use: April & xanax use was 10 mg every day for "a few months"    Social History   Socioeconomic History   Marital status: Single    Spouse name: Not on file   Number of children: Not on file  Years of education: Not on file   Highest education level: Not on file  Occupational History    Comment: Unclear  Tobacco Use   Smoking status: Former    Current packs/day: 0.00    Types: Cigarettes    Quit date: 02/21/2022    Years since quitting: 1.9   Smokeless tobacco: Never   Tobacco comments:    He stopped smoking in April of  2023. He said that he used to smoke "a few packs per day."   Vaping Use   Vaping status: Never Used  Substance and Sexual Activity   Alcohol use: Not Currently    Comment: has been sober since June 4th   Drug use: Not Currently    Types: Marijuana, Benzodiazepines    Comment: THC: was smoking 1 ounce every day, last use: April & xanax use was 10 mg every day for "a few months"   Sexual activity: Not Currently  Other Topics Concern   Not on file  Social History Narrative   Pt reported that he lives in a house apartment.   Social Drivers of Corporate investment banker Strain: Low Risk  (05/26/2023)   Received from [facility]   Overall Financial Resource Strain (CARDIA)    Difficulty of Paying Living Expenses: Not hard at all  Food Insecurity: No Food Insecurity (01/19/2024)   Hunger Vital Sign    Worried About Running Out of Food in the Last Year: Never true    Ran Out of Food in the Last Year: Never true  Transportation Needs: No Transportation Needs (01/19/2024)   PRAPARE - Administrator, Civil Service (Medical): No    Lack of Transportation (Non-Medical): No  Physical Activity: Inactive (05/26/2023)   [facility]   Exercise Vital Sign    Days of Exercise per Week: 0 days    Minutes of Exercise per Session: 0 min  Stress: Stress Concern Present (05/26/2023)   Received from [facility] of Occupational Health - Occupational Stress Questionnaire    Feeling of Stress : Rather much  Social Connections: Patient Unable To Answer (01/19/2024)   Social Connection and Isolation Panel [NHANES]    Frequency of Communication with Friends and Family: Patient unable to answer    Frequency of Social Gatherings with Friends and Family: Patient unable to answer    Attends Religious Services: Patient unable to answer    Active Member of Clubs or Organizations: Patient unable to answer    Attends Banker Meetings: Patient  unable to answer    Marital Status: Patient unable to answer   Additional Social History:                         Sleep: Poor  Appetite:  Fair  Current Medications: Current Facility-Administered Medications  Medication Dose Route Frequency Provider Last Rate Last Admin   acetaminophen (TYLENOL) tablet 650 mg  650 mg Oral Q6H PRN Jearld Lesch, NP       alum & mag hydroxide-simeth (MAALOX/MYLANTA) 200-200-20 MG/5ML suspension 30 mL  30 mL Oral Q4H PRN Dixon, Rashaun M, NP   30 mL at 01/20/24 1409   haloperidol (HALDOL) tablet 5 mg  5 mg Oral TID PRN Jearld Lesch, NP       And   diphenhydrAMINE (BENADRYL) capsule 50 mg  50 mg Oral TID PRN Jearld Lesch, NP  haloperidol lactate (HALDOL) injection 5 mg  5 mg Intramuscular TID PRN Jearld Lesch, NP       And   diphenhydrAMINE (BENADRYL) injection 50 mg  50 mg Intramuscular TID PRN Jearld Lesch, NP       And   LORazepam (ATIVAN) injection 2 mg  2 mg Intramuscular TID PRN Jearld Lesch, NP       haloperidol lactate (HALDOL) injection 10 mg  10 mg Intramuscular TID PRN Jearld Lesch, NP       And   diphenhydrAMINE (BENADRYL) injection 50 mg  50 mg Intramuscular TID PRN Jearld Lesch, NP       And   LORazepam (ATIVAN) injection 2 mg  2 mg Intramuscular TID PRN Jearld Lesch, NP       divalproex (DEPAKOTE) DR tablet 750 mg  750 mg Oral BID Myriam Forehand, NP   750 mg at 01/21/24 1715   magnesium hydroxide (MILK OF MAGNESIA) suspension 30 mL  30 mL Oral Daily PRN Jearld Lesch, NP       QUEtiapine (SEROQUEL) tablet 200 mg  200 mg Oral QHS Christol Thetford, PA-C   200 mg at 01/21/24 2150   traZODone (DESYREL) tablet 50 mg  50 mg Oral QHS PRN Jearld Lesch, NP   50 mg at 01/18/24 2330    Lab Results:  Results for orders placed or performed during the hospital encounter of 01/18/24 (from the past 48 hours)  Hemoglobin A1c     Status: None   Collection Time: 01/20/24  6:43 AM  Result Value  Ref Range   Hgb A1c MFr Bld 5.1 4.8 - 5.6 %    Comment: (NOTE) Pre diabetes:          5.7%-6.4%  Diabetes:              >6.4%  Glycemic control for   <7.0% adults with diabetes    Mean Plasma Glucose 99.67 mg/dL    Comment: Performed at [facility], [address] [place], [place] [address]  Vitamin B12     Status: None   Collection Time: 01/20/24  6:47 AM  Result Value Ref Range   Vitamin B-12 230 180 - 914 pg/mL    Comment: (NOTE) This assay is not validated for testing neonatal or myeloproliferative syndrome specimens for Vitamin B12 levels. Performed at [facility], [address], [place], [place] [address]     Blood Alcohol level:  Lab Results  Component Value Date   [facility] <10 01/18/2024   ETH <10 10/28/2023    Metabolic Disorder Labs: Lab Results  Component Value Date   HGBA1C 5.1 01/20/2024   MPG 99.67 01/20/2024   MPG 114 10/28/2023   Lab Results  Component Value Date   PROLACTIN 30.4 (H) 01/06/2017   PROLACTIN 13.1 01/05/2017   Lab Results  Component Value Date   CHOL 182 10/28/2023   TRIG 145 10/28/2023   HDL 38 (L) 10/28/2023   CHOLHDL 4.8 10/28/2023   VLDL 29 10/28/2023   LDLCALC 115 (H) 10/28/2023   LDLCALC 78 07/25/2022    Physical Findings: AIMS:  , ,  ,  ,    CIWA:    COWS:     Musculoskeletal: Strength & Muscle Tone: within normal limits Gait & Station: normal Patient leans: N/A  Psychiatric Specialty Exam:  Presentation  General Appearance:  Appropriate for Environment  Eye Contact: Good  Speech: Pressured  Speech Volume:  Normal  Handedness: Left   Mood and Affect  Mood: Anxious  Affect: Congruent; Other (comment) (guarded, evasive)   Thought Process  Thought Processes: Disorganized  Descriptions of Associations:Circumstantial  Orientation:Full (Time, Place and Person)  Thought Content:Perseveration; Paranoid Ideation  History of Schizophrenia/Schizoaffective disorder:Yes  Duration  of Psychotic Symptoms:Greater than six months  Hallucinations:Hallucinations: None  Ideas of Reference:None  Suicidal Thoughts:Suicidal Thoughts: No  Homicidal Thoughts:Homicidal Thoughts: No   Sensorium  Memory: Immediate Fair; Recent Fair; Remote Fair  Judgment: Poor  Insight: Poor   Executive Functions  Concentration: Poor  Attention Span: Fair  Recall: [employer] of Knowledge: Fair  Language: Fair   Psychomotor Activity  Psychomotor Activity: Psychomotor Activity: Normal   Assets  Assets: Housing; Social Support; Financial Resources/Insurance   Sleep  Sleep: Sleep: Fair    Physical Exam: Physical Exam Vitals and nursing note reviewed.  Constitutional:      Appearance: Normal appearance. He is obese.  HENT:     Head: Normocephalic and atraumatic.  Eyes:     Extraocular Movements: Extraocular movements intact.  Pulmonary:     Effort: Pulmonary effort is normal.  Skin:    General: Skin is dry.  Neurological:     Mental Status: He is alert and oriented to person, place, and time.  Psychiatric:        Attention and Perception: Perception normal. He is inattentive.        Mood and Affect: Mood is anxious.        Behavior: Behavior is uncooperative.        Thought Content: Thought content is paranoid.        Cognition and Memory: Memory normal.        Judgment: Judgment is impulsive and inappropriate.     Comments: Speech is pressured, with normal rate Affect is guarded, evasive Insight and judgment poor    Review of Systems  Psychiatric/Behavioral:  The patient is nervous/anxious and has insomnia.   All other systems reviewed and are negative.  Blood pressure 121/80, pulse 76, temperature (!) 97.3 F (36.3 C), resp. rate 20, height 5\' 10"  (1.778 m), weight 100 kg, SpO2 99%. Body mass index is 31.63 kg/m.   Treatment Plan Summary:  1.    Safety and Monitoring:   --  Voluntary admission to inpatient psychiatric unit for  safety, stabilization and treatment -- Daily contact with patient to assess and evaluate symptoms and progress in treatment -- Patient's case to be discussed in multi-disciplinary team meeting -- Observation Level : q15 minute checks -- Vital signs:  q12 hours -- Precautions: assault    2. Psychiatric Diagnoses and Treatment:   01/21/2024 -- Continue Depakote ER 750 mg twice daily for mood stabilization -- Discontinue Klonopin 0.5 mg 3 times daily for psychomotor agitation, pt refusing  -- Continue Seroquel 200 mg at bedtime for mood stabilization/psychosis -- Continue trazodone 50 mg at bedtime as needed for sleep -- EKG NSR QTC 440  01/20/2024 -- Continue Depakote ER 750 mg twice daily for mood stabilization -- Increase Klonopin to 0.5 mg 3 times daily for psychomotor agitation -- Continue Seroquel 200 mg at bedtime for mood stabilization/psychosis -- Continue trazodone 50 mg at bedtime as needed for sleep -- Recheck EKG s/t prolonged Qtc update - EKG NSR QTC 440  --  The risks/benefits/side-effects/alternatives to this medication were discussed in detail with the patient and time was given for questions. The patient consents to medication trial. -- Metabolic profile and EKG monitoring obtained  while on an atypical antipsychotic  -- Encouraged patient to participate in unit milieu and in scheduled group therapies -- Short Term Goals: Ability to identify changes in lifestyle to reduce recurrence of condition will improve, Ability to verbalize feelings will improve, Ability to disclose and discuss suicidal ideas, Ability to demonstrate self-control will improve, Ability to identify and develop effective coping behaviors will improve, Ability to maintain clinical measurements within normal limits will improve, Compliance with prescribed medications will improve, and Ability to identify triggers associated with substance abuse/mental health issues will improve -- Long Term Goals: Improvement  in symptoms so as ready for discharge        3. Medical Issues Being Addressed:   None    4. Discharge Planning:   -- Social work and case management to assist with discharge planning and identification of hospital follow-up needs prior to discharge -- Estimated LOS: 5-7 days -- Discharge Concerns: Need to establish a safety plan; Medication compliance and effectiveness -- Discharge Goals: Return home with outpatient referrals for mental health follow-up including medication management/psychotherapy    Paulene Floor, PA-C 01/22/2024, 12:19 AM

## 2024-01-21 NOTE — Progress Notes (Signed)
   01/21/24 1100  Psych Admission Type (Psych Patients Only)  Admission Status Voluntary  Psychosocial Assessment  Patient Complaints Irritability (c/o "getting ekg last night and states staff is making fun of me")  Eye Contact Intense  Facial Expression Animated  Affect Irritable;Labile  Speech Logical/coherent;Rapid  Interaction Assertive  Motor Activity Fidgety  Appearance/Hygiene In scrubs  Behavior Characteristics Anxious;Pacing  Mood Elated  Thought Process  Coherency Loose associations  Content Paranoia  Delusions Paranoid  Perception Depersonalization  Hallucination None reported or observed  Judgment Poor  Confusion Mild  Danger to Self  Current suicidal ideation? Denies  Agreement Not to Harm Self Yes  Description of Agreement verbal  Danger to Others  Danger to Others None reported or observed

## 2024-01-21 NOTE — Plan of Care (Signed)
   Problem: Education: Goal: Knowledge of [facility] Education information/materials will improve Outcome: Progressing Goal: Emotional status will improve Outcome: Progressing Goal: Mental status will improve Outcome: Progressing

## 2024-01-21 NOTE — Group Note (Signed)
 Recreation Therapy Group Note   Group Topic:Problem Solving  Group Date: 01/21/2024 Start Time: 1000 End Time: 1045 Facilitators: Rosina Lowenstein, LRT, CTRS Location:  Craft Room   Group Description: Life Boat. Patients were given the scenario that they are on a boat that is about to become shipwrecked, leaving them stranded on an [place]. They are asked to make a list of 15 different items that they want to take with them when they are stranded on the [place]. Patients are asked to rank their items from most important to least important, #1 being the most important and #15 being the least. Patients will work individually for the first round to come up with 15 items and then pair up with a peer(s) to condense their list and come up with one list of 15 items between the two of them. Patients or LRT will read aloud the 15 different items to the group after each round. LRT facilitated post-activity processing to discuss how this activity can be used in daily life post discharge.   Goal Area(s) Addressed:  Patient will identify priorities, wants and needs. Patient will communicate with LRT and peers. Patient will work collectively as a Administrator, Civil Service. Patient will work on Product manager.    Affect/Mood: Appropriate   Participation Level: Active and Engaged   Participation Quality: Independent   Behavior: Bizarre and Cooperative   Speech/Thought Process: Coherent   Insight: Good   Judgement: Fair    Modes of Intervention: Education, Exploration, Group work, Guided Discussion, Dentist, and Socialization   Patient Response to Interventions:  Attentive, Engaged, and Receptive   Education Outcome:  Acknowledges education   Clinical Observations/Individualized Feedback: Albert [name] was active in their participation of session activities and group discussion. Pt identified "psychiatric medication, the perfect brain, floss, soap and books" as items he will bring. Pt asked good  clarifying questions. Pt was noted to be staring into the distance and making different facial expression at odd time, as if responding to internal stimuli. Pt interacted well with LRT and peers duration of session.    Plan: Continue to engage patient in RT group sessions 2-3x/week.   Rosina Lowenstein, LRT, CTRS 01/21/2024 11:36 AM

## 2024-01-21 NOTE — Group Note (Signed)
 Date:  01/21/2024 Time:  8:58 PM  Group Topic/Focus:  Emotional Education:   The focus of this group is to discuss what feelings/emotions are, and how they are experienced.    Participation Level:  Active  Participation Quality:  Appropriate, Attentive, Sharing, and Supportive  Affect:  Appropriate  Cognitive:  Alert and Appropriate  Insight: Appropriate and Good  Engagement in Group:  Engaged and Improving  Modes of Intervention:  Clarification, Discussion, Limit-setting, Rapport Building, and Socialization  Additional Comments:     Malik Paar 01/21/2024, 8:58 PM

## 2024-01-21 NOTE — Plan of Care (Signed)
   Problem: Education: Goal: Emotional status will improve Outcome: Not Progressing Goal: Mental status will improve Outcome: Not Progressing Goal: Verbalization of understanding the information provided will improve Outcome: Not Progressing

## 2024-01-21 NOTE — Progress Notes (Signed)
   01/21/24 1530  Spiritual Encounters  Type of Visit Initial  Care provided to: Patient  Conversation partners present during encounter Nurse  Reason for visit Routine spiritual support  OnCall Visit No   Chaplain visited with patient and provided reflective listening and a compassionate presence.  Patient shared about journey with autism.  Rev. Rana M. Earlene Plater, M.Div. Chaplain Resident [facility]

## 2024-01-22 LAB — VITAMIN B6: Vitamin B6: 6.2 ug/L (ref 3.4–65.2)

## 2024-01-22 MED ORDER — QUETIAPINE FUMARATE 200 MG PO TABS
300.0000 mg | ORAL_TABLET | Freq: Every day | ORAL | Status: DC
  Administered 2024-01-22: 300 mg via ORAL
  Filled 2024-01-22: qty 1

## 2024-01-22 NOTE — Plan of Care (Signed)
   Problem: Education: Goal: Emotional status will improve Outcome: Progressing   Problem: Education: Goal: Mental status will improve Outcome: Progressing

## 2024-01-22 NOTE — Progress Notes (Signed)
 Patient in dayroom yelling, chanting loudly, repeatedly  "Team Wallace Cullens protect Albert [name]. Not able to re-direct patient. Agitated. Agitation protocol given, awaiting effectiveness.

## 2024-01-22 NOTE — Progress Notes (Signed)
   01/22/24 1500  Psych Admission Type (Psych Patients Only)  Admission Status Voluntary  Psychosocial Assessment  Patient Complaints None  Eye Contact Intense;Watchful  Facial Expression Other (Comment) (appropriate)  Affect Appropriate to circumstance  Speech Logical/coherent  Interaction Assertive  Motor Activity Slow  Appearance/Hygiene Unremarkable  Behavior Characteristics Cooperative;Appropriate to situation  Mood Pleasant (patient's goal for today is "communication, relating and connecting to staff and patients".)  Aggressive Behavior  Effect No apparent injury  Thought Process  Coherency WDL  Content WDL  Delusions None reported or observed  Perception WDL  Hallucination None reported or observed  Judgment WDL  Confusion None  Danger to Self  Current suicidal ideation? Denies  Agreement Not to Harm Self Yes  Description of Agreement Verbal  Danger to Others  Danger to Others None reported or observed

## 2024-01-22 NOTE — Group Note (Signed)
 Date:  01/22/2024 Time:  11:13 PM  Group Topic/Focus:  Wrap-Up Group:   The focus of this group is to help patients review their daily goal of treatment and discuss progress on daily workbooks.    Participation Level:  Active  Participation Quality:  Appropriate and Attentive  Affect:  Appropriate  Cognitive:  Alert and Appropriate  Insight: Appropriate and Good  Engagement in Group:  Developing/Improving  Modes of Intervention:  Activity, Rapport Building, and Socialization  Additional Comments:  \   Joslin Doell 01/22/2024, 11:13 PM

## 2024-01-22 NOTE — Group Note (Signed)
 [facility] LCSW Group Therapy Note   Group Date: 01/22/2024 Start Time: 1310 End Time: 1350   Type of Therapy/Topic:  Group Therapy:  Emotion Regulation  Participation Level:  None   Mood:  Description of Group:    The purpose of this group is to assist patients in learning to regulate negative emotions and experience positive emotions. Patients will be guided to discuss ways in which they have been vulnerable to their negative emotions. These vulnerabilities will be juxtaposed with experiences of positive emotions or situations, and patients challenged to use positive emotions to combat negative ones. Special emphasis will be placed on coping with negative emotions in conflict situations, and patients will process healthy conflict resolution skills.  Therapeutic Goals: Patient will identify two positive emotions or experiences to reflect on in order to balance out negative emotions:  Patient will label two or more emotions that they find the most difficult to experience:  Patient will be able to demonstrate positive conflict resolution skills through discussion or role plays:   Summary of Patient Progress: Patient was present for the entirety of the group process. However, his behavior during group was quite disruptive. He shared that he needed to breath and made a snorting sound. Initially this started off at intervals and increased in frequency until he was snorting continuously.    Therapeutic Modalities:   Cognitive Behavioral Therapy Feelings Identification Dialectical Behavioral Therapy   Glenis Smoker, LCSW

## 2024-01-22 NOTE — Plan of Care (Signed)

## 2024-01-22 NOTE — Plan of Care (Signed)
   Problem: Education: Goal: Emotional status will improve Outcome: Progressing Goal: Mental status will improve Outcome: Progressing Goal: Verbalization of understanding the information provided will improve Outcome: Progressing

## 2024-01-22 NOTE — Progress Notes (Signed)
 [facility] MD Progress Note  01/22/2024 12:39 PM Albert [name]  MRN:  [id number]   48 year old Caucasian male with a psychiatric history significant for schizoaffective disorder, bipolar type, paranoid schizophrenia, and alcohol dependence, who presented to the emergency department on 01/18/2024 at 2:03 AM due to inability to sleep for at least two days, along with agitation, psychosis, and manic symptoms.The patient is currently noncompliant with his outpatient medications (Depakote and Seroquel), which is supported by both his self-report and a low valproic acid level. He presents with disorganized thinking, tangential speech, and delusional content. He endorses belief in a computer AI program called "MOM," which he claims gives him sexual sensations and communicates with him in public places, such as [place]. He references a sponsor named "Dene Gentry," whom he believes is associated with this program; however, collateral from the patient's mother confirms that Dene Gentry is not a real individual and that these beliefs are consistent with his prior psychotic episodes.   Subjective: Patient's case discussed with multidisciplinary team, all vitals and notes were reviewed.  No behavioral outbursts reported overnight.  Patient is seen for reassessment, reports that he is doing better.  Speech continues to be pressured, improved with normal rate today.  Continues to be paranoid, perseverating on medication regimen.  Has declined any modifications in medication regimen.  Declined oral replacement of vitamin B12, has refused Klonopin which was ordered for psychomotor agitation. Patient guarded, evasive throughout reassessment, seen during treatment team, difficult to redirect, uncooperative.  He is denying any psychiatric symptoms.  States that Seroquel and Depakote are the best medications for him.  Patient with bizarre behaviors, keeps sniffling deeply, when questioned about this he states that "  everything is fine, if it is nothing is broken then it does not need to be fixed".  Per staff, patient continues to be paranoid, thinks that staff is making fun of him and begin about him.  A1c 5.1 B12 230 on lower end of normal, will replace -patient declines oral replacement   Principal Problem: Schizoaffective disorder, bipolar type (HCC) Diagnosis: Principal Problem:   Schizoaffective disorder, bipolar type (HCC) Active Problems:   Medical non-compliance   Tachycardia  Total Time spent with patient: 30 min   Past Psychiatric History: see H&P  Past Medical History:  Past Medical History:  Diagnosis Date   Alcoholism (HCC)    Bipolar disorder (HCC)    Drug abuse (HCC)    xanax addiction 5 years ago   Heartburn    Medical non-compliance    Paranoid schizophrenia (HCC)    Schizophrenia (HCC)    Seizures (HCC)     Past Surgical History:  Procedure Laterality Date   NO PAST SURGERIES     Family History:  Family History  Problem Relation Age of Onset   Thyroid disease Mother    Depression Maternal Aunt    Family Psychiatric  History:  see H&P Social History:  Social History   Substance and Sexual Activity  Alcohol Use Not Currently   Comment: has been sober since June 4th     Social History   Substance and Sexual Activity  Drug Use Not Currently   Types: Marijuana, Benzodiazepines   Comment: THC: was smoking 1 ounce every day, last use: April & xanax use was 10 mg every day for "a few months"    Social History   Socioeconomic History   Marital status: Single    Spouse name: Not on file   Number of children: Not on file  Years of education: Not on file   Highest education level: Not on file  Occupational History    Comment: Unclear  Tobacco Use   Smoking status: Former    Current packs/day: 0.00    Types: Cigarettes    Quit date: 02/21/2022    Years since quitting: 1.9   Smokeless tobacco: Never   Tobacco comments:    He stopped smoking in April of  2023. He said that he used to smoke "a few packs per day."   Vaping Use   Vaping status: Never Used  Substance and Sexual Activity   Alcohol use: Not Currently    Comment: has been sober since June 4th   Drug use: Not Currently    Types: Marijuana, Benzodiazepines    Comment: THC: was smoking 1 ounce every day, last use: April & xanax use was 10 mg every day for "a few months"   Sexual activity: Not Currently  Other Topics Concern   Not on file  Social History Narrative   Pt reported that he lives in a house apartment.   Social Drivers of Corporate investment banker Strain: Low Risk  (05/26/2023)   Received from [facility]   Overall Financial Resource Strain (CARDIA)    Difficulty of Paying Living Expenses: Not hard at all  Food Insecurity: No Food Insecurity (01/19/2024)   Hunger Vital Sign    Worried About Running Out of Food in the Last Year: Never true    Ran Out of Food in the Last Year: Never true  Transportation Needs: No Transportation Needs (01/19/2024)   PRAPARE - Administrator, Civil Service (Medical): No    Lack of Transportation (Non-Medical): No  Physical Activity: Inactive (05/26/2023)   Received from [facility]   Exercise Vital Sign    Days of Exercise per Week: 0 days    Minutes of Exercise per Session: 0 min  Stress: Stress Concern Present (05/26/2023)   Received from [facility] of Occupational Health - Occupational Stress Questionnaire    Feeling of Stress : Rather much  Social Connections: Patient Unable To Answer (01/19/2024)   Social Connection and Isolation Panel [NHANES]    Frequency of Communication with Friends and Family: Patient unable to answer    Frequency of Social Gatherings with Friends and Family: Patient unable to answer    Attends Religious Services: Patient unable to answer    Active Member of Clubs or Organizations: Patient unable to answer    Attends Banker Meetings: Patient  unable to answer    Marital Status: Patient unable to answer   Additional Social History:                         Sleep: Poor  Appetite:  Fair  Current Medications: Current Facility-Administered Medications  Medication Dose Route Frequency Provider Last Rate Last Admin   acetaminophen (TYLENOL) tablet 650 mg  650 mg Oral Q6H PRN Jearld Lesch, NP       alum & mag hydroxide-simeth (MAALOX/MYLANTA) 200-200-20 MG/5ML suspension 30 mL  30 mL Oral Q4H PRN Dixon, Rashaun M, NP   30 mL at 01/20/24 1409   haloperidol (HALDOL) tablet 5 mg  5 mg Oral TID PRN Jearld Lesch, NP       And   diphenhydrAMINE (BENADRYL) capsule 50 mg  50 mg Oral TID PRN Jearld Lesch, NP  haloperidol lactate (HALDOL) injection 5 mg  5 mg Intramuscular TID PRN Jearld Lesch, NP       And   diphenhydrAMINE (BENADRYL) injection 50 mg  50 mg Intramuscular TID PRN Jearld Lesch, NP       And   LORazepam (ATIVAN) injection 2 mg  2 mg Intramuscular TID PRN Jearld Lesch, NP       haloperidol lactate (HALDOL) injection 10 mg  10 mg Intramuscular TID PRN Jearld Lesch, NP       And   diphenhydrAMINE (BENADRYL) injection 50 mg  50 mg Intramuscular TID PRN Jearld Lesch, NP       And   LORazepam (ATIVAN) injection 2 mg  2 mg Intramuscular TID PRN Jearld Lesch, NP       divalproex (DEPAKOTE) DR tablet 750 mg  750 mg Oral BID Myriam Forehand, NP   750 mg at 01/22/24 0850   magnesium hydroxide (MILK OF MAGNESIA) suspension 30 mL  30 mL Oral Daily PRN Jearld Lesch, NP       QUEtiapine (SEROQUEL) tablet 200 mg  200 mg Oral QHS Tarah Buboltz, PA-C   200 mg at 01/21/24 2150   traZODone (DESYREL) tablet 50 mg  50 mg Oral QHS PRN Jearld Lesch, NP   50 mg at 01/18/24 2330    Lab Results:  No results found for this or any previous visit (from the past 48 hours).   Blood Alcohol level:  Lab Results  Component Value Date   ETH <10 01/18/2024   ETH <10 10/28/2023     Metabolic Disorder Labs: Lab Results  Component Value Date   HGBA1C 5.1 01/20/2024   MPG 99.67 01/20/2024   MPG 114 10/28/2023   Lab Results  Component Value Date   PROLACTIN 30.4 (H) 01/06/2017   PROLACTIN 13.1 01/05/2017   Lab Results  Component Value Date   CHOL 182 10/28/2023   TRIG 145 10/28/2023   HDL 38 (L) 10/28/2023   CHOLHDL 4.8 10/28/2023   VLDL 29 10/28/2023   LDLCALC 115 (H) 10/28/2023   LDLCALC 78 07/25/2022    Physical Findings: AIMS:  , ,  ,  ,    CIWA:    COWS:     Musculoskeletal: Strength & Muscle Tone: within normal limits Gait & Station: normal Patient leans: N/A  Psychiatric Specialty Exam:  Presentation  General Appearance:  Appropriate for Environment  Eye Contact: Good  Speech: Pressured  Speech Volume: Normal  Handedness: Left   Mood and Affect  Mood: Anxious  Affect: Congruent; Other (comment) (guarded, evasive)   Thought Process  Thought Processes: Disorganized  Descriptions of Associations:Circumstantial  Orientation:Full (Time, Place and Person)  Thought Content:Perseveration; Paranoid Ideation  History of Schizophrenia/Schizoaffective disorder:Yes  Duration of Psychotic Symptoms:Greater than six months  Hallucinations:Hallucinations: None  Ideas of Reference:None  Suicidal Thoughts:Suicidal Thoughts: No  Homicidal Thoughts:Homicidal Thoughts: No   Sensorium  Memory: Immediate Fair; Recent Fair; Remote Fair  Judgment: Poor  Insight: Poor   Executive Functions  Concentration: Poor  Attention Span: Fair  Recall: [employer] of Knowledge: Fair  Language: Fair   Psychomotor Activity  Psychomotor Activity: Psychomotor Activity: Normal   Assets  Assets: Housing; Social Support; Financial Resources/Insurance   Sleep  Sleep: Sleep: Fair    Physical Exam: Physical Exam Vitals and nursing note reviewed.  Constitutional:      Appearance: Normal appearance. He is  obese.  HENT:     Head:  Normocephalic and atraumatic.  Eyes:     Extraocular Movements: Extraocular movements intact.  Pulmonary:     Effort: Pulmonary effort is normal.  Skin:    General: Skin is dry.  Neurological:     Mental Status: He is alert and oriented to person, place, and time.  Psychiatric:        Attention and Perception: Perception normal. He is inattentive.        Mood and Affect: Mood is anxious.        Behavior: Behavior is uncooperative.        Thought Content: Thought content is paranoid.        Cognition and Memory: Memory normal.        Judgment: Judgment is impulsive and inappropriate.     Comments: Speech is pressured, with normal rate Affect is guarded, evasive Insight and judgment poor    Review of Systems  Psychiatric/Behavioral:  The patient is nervous/anxious and has insomnia.   All other systems reviewed and are negative.  Blood pressure (!) 87/56, pulse 69, temperature 98.6 F (37 C), resp. rate (!) 22, height 5\' 10"  (1.778 m), weight 100 kg, SpO2 99%. Body mass index is 31.63 kg/m.   Treatment Plan Summary:  1.    Safety and Monitoring:   --  Voluntary admission to inpatient psychiatric unit for safety, stabilization and treatment -- Daily contact with patient to assess and evaluate symptoms and progress in treatment -- Patient's case to be discussed in multi-disciplinary team meeting -- Observation Level : q15 minute checks -- Vital signs:  q12 hours -- Precautions: assault    2. Psychiatric Diagnoses and Treatment:   01/21/2024 -- Continue Depakote ER 750 mg twice daily for mood stabilization -- Discontinue Klonopin 0.5 mg 3 times daily for psychomotor agitation, pt refusing  -- Continue Seroquel 200 mg at bedtime for mood stabilization/psychosis -- Continue trazodone 50 mg at bedtime as needed for sleep -- EKG NSR QTC 440  01/20/2024 -- Continue Depakote ER 750 mg twice daily for mood stabilization -- Increase Klonopin to 0.5 mg 3  times daily for psychomotor agitation -- Continue Seroquel 200 mg at bedtime for mood stabilization/psychosis -- Continue trazodone 50 mg at bedtime as needed for sleep -- Recheck EKG s/t prolonged Qtc update - EKG NSR QTC 440  --  The risks/benefits/side-effects/alternatives to this medication were discussed in detail with the patient and time was given for questions. The patient consents to medication trial. -- Metabolic profile and EKG monitoring obtained while on an atypical antipsychotic  -- Encouraged patient to participate in unit milieu and in scheduled group therapies -- Short Term Goals: Ability to identify changes in lifestyle to reduce recurrence of condition will improve, Ability to verbalize feelings will improve, Ability to disclose and discuss suicidal ideas, Ability to demonstrate self-control will improve, Ability to identify and develop effective coping behaviors will improve, Ability to maintain clinical measurements within normal limits will improve, Compliance with prescribed medications will improve, and Ability to identify triggers associated with substance abuse/mental health issues will improve -- Long Term Goals: Improvement in symptoms so as ready for discharge        3. Medical Issues Being Addressed:   None    4. Discharge Planning:   -- Social work and case management to assist with discharge planning and identification of hospital follow-up needs prior to discharge -- Estimated LOS: 5-7 days -- Discharge Concerns: Need to establish a safety plan; Medication compliance and effectiveness -- Discharge Goals:  Return home with outpatient referrals for mental health follow-up including medication management/psychotherapy    Paulene Floor, PA-C 01/22/2024, 12:39 PM

## 2024-01-22 NOTE — Progress Notes (Signed)
   01/22/24 2100  Psych Admission Type (Psych Patients Only)  Admission Status Voluntary  Psychosocial Assessment  Patient Complaints None  Eye Contact Watchful  Facial Expression Sad  Affect Appropriate to circumstance  Speech Logical/coherent  Interaction Assertive  Motor Activity Slow  Appearance/Hygiene Unremarkable  Behavior Characteristics Cooperative;Appropriate to situation  Mood Pleasant  Aggressive Behavior  Type of Behavior Verbal  Effect No apparent injury  Thought Process  Coherency WDL  Content WDL  Delusions None reported or observed  Perception WDL  Hallucination None reported or observed  Judgment WDL  Confusion None  Danger to Self  Current suicidal ideation? Denies  Agreement Not to Harm Self Yes  Description of Agreement verbal  Danger to Others  Danger to Others None reported or observed   Patient pleasant and cooperative. Denies SI, HI, AVH. Med compliant. Appropriate with staff and peers. Voiced no concerns or complaints. Encouragement and support provided. Safety checks maintained. Medications given as prescribed. Pt remains safe on unit with q 15 min checks.

## 2024-01-22 NOTE — Progress Notes (Signed)
 Nursing Shift Note:  1900-0700  Attended Evening Group: Yes Medication Compliant:  Yes Behavior: calm and cooperative Sleep Quality: good Significant Change:  improvement noted since Sunday  01/22/24 0500  Psych Admission Type (Psych Patients Only)  Admission Status Voluntary  Psychosocial Assessment  Patient Complaints Irritability  Eye Contact Intense  Facial Expression Animated  Affect Labile  Speech Logical/coherent  Interaction Assertive  Motor Activity Fidgety  Appearance/Hygiene In scrubs  Behavior Characteristics Anxious  Mood Elated  Thought Process  Coherency Loose associations  Content Paranoia  Delusions Paranoid  Perception WDL  Hallucination None reported or observed  Judgment Poor  Confusion Mild  Danger to Self  Current suicidal ideation? Denies  Danger to Others  Danger to Others None reported or observed

## 2024-01-22 NOTE — Group Note (Signed)
 Date:  01/22/2024 Time:  9:59 AM  Group Topic/Focus:  Dimensions of Wellness:   The focus of this group is to introduce the topic of wellness and discuss the role each dimension of wellness plays in total health.    Participation Level:  Active  Participation Quality:  Appropriate  Affect:  Appropriate  Cognitive:  Appropriate  Insight: Appropriate  Engagement in Group:  Engaged  Modes of Intervention:  Activity  Additional Comments:    Albert [name] 01/22/2024, 9:59 AM

## 2024-01-23 MED ORDER — FLUPHENAZINE HCL 5 MG PO TABS
10.0000 mg | ORAL_TABLET | Freq: Two times a day (BID) | ORAL | Status: DC
  Filled 2024-01-23: qty 2

## 2024-01-23 MED ORDER — QUETIAPINE FUMARATE 200 MG PO TABS
200.0000 mg | ORAL_TABLET | Freq: Every day | ORAL | Status: DC
  Administered 2024-01-23 – 2024-01-25 (×3): 200 mg via ORAL
  Filled 2024-01-23 (×3): qty 1

## 2024-01-23 MED ORDER — FLUPHENAZINE HCL 5 MG PO TABS
7.5000 mg | ORAL_TABLET | Freq: Two times a day (BID) | ORAL | Status: DC
  Filled 2024-01-23: qty 1

## 2024-01-23 MED ORDER — QUETIAPINE FUMARATE 200 MG PO TABS: 400.0000 mg | ORAL_TABLET | Freq: Every day | ORAL | Status: DC

## 2024-01-23 MED ORDER — FLUPHENAZINE HCL 5 MG PO TABS
5.0000 mg | ORAL_TABLET | Freq: Two times a day (BID) | ORAL | Status: AC
  Administered 2024-01-24 – 2024-01-25 (×2): 5 mg via ORAL
  Filled 2024-01-23 (×2): qty 1

## 2024-01-23 MED ORDER — FLUPHENAZINE HCL 5 MG PO TABS
2.5000 mg | ORAL_TABLET | Freq: Two times a day (BID) | ORAL | Status: AC
Start: 2024-01-23 — End: 2024-01-24
  Administered 2024-01-23 – 2024-01-24 (×2): 2.5 mg via ORAL
  Filled 2024-01-23 (×3): qty 1

## 2024-01-23 NOTE — Group Note (Signed)
 Recreation Therapy Group Note   Group Topic:Health and Wellness  Group Date: 01/23/2024 Start Time: 1530 End Time: 1645 Facilitators: Rosina Lowenstein, LRT, CTRS Location: Courtyard  Group Description: [employer]. LRT and patients played games of basketball, drew with chalk, and played corn hole while outside in the courtyard while getting fresh air and sunlight. Music was being played in the background. LRT and peers conversed about different games they have played before, what they do in their free time and anything else that is on their minds. LRT encouraged pts to drink water after being outside, sweating and getting their heart rate up.  Goal Area(s) Addressed: Patient will build on frustration tolerance skills. Patients will partake in a competitive play game with peers. Patients will gain knowledge of new leisure interest/hobby.    Affect/Mood: Appropriate   Participation Level: Active   Participation Quality: Independent   Behavior: Appropriate   Speech/Thought Process: Coherent   Insight: Fair   Judgement: Fair    Modes of Intervention: Activity   Patient Response to Interventions:  Receptive   Education Outcome:  Acknowledges education   Clinical Observations/Individualized Feedback: Albert [name] was active in their participation of session activities and group discussion. Pt interacted well with LRT and peers duration of session.    Plan: Continue to engage patient in RT group sessions 2-3x/week.   Rosina Lowenstein, LRT, CTRS 01/23/2024 5:23 PM

## 2024-01-23 NOTE — Group Note (Signed)
 LCSW Group Therapy Note   Group Date: 01/23/2024 Start Time: 1300 End Time: 1400   Type of Therapy and Topic:  Group Therapy: Challenging Core Beliefs  Participation Level:  Active  Description of Group:  Patients were educated about core beliefs and asked to identify one harmful core belief that they have. Patients were asked to explore from where those beliefs originate. Patients were asked to discuss how those beliefs make them feel and the resulting behaviors of those beliefs. They were then be asked if those beliefs are true and, if so, what evidence they have to support them. Lastly, group members were challenged to replace those negative core beliefs with helpful beliefs.   Therapeutic Goals:   1. Patient will identify harmful core beliefs and explore the origins of such beliefs. 2. Patient will identify feelings and behaviors that result from those core beliefs. 3. Patient will discuss whether such beliefs are true. 4.  Patient will replace harmful core beliefs with helpful ones.  Summary of Patient Progress:  Patient actively engaged in processing and exploring how core beliefs are formed and how they impact thoughts, feelings, and behaviors. Patient proved open to input from peers and feedback from CSW. Patient demonstrated proficient insight into the subject matter, was respectful and supportive of peers, and participated throughout the entire session.  Therapeutic Modalities: Cognitive Behavioral Therapy; Solution-Focused Therapy   Perrin Smack 01/23/2024  1:43 PM

## 2024-01-23 NOTE — Progress Notes (Signed)
 [facility] MD Progress Note  01/23/2024 5:21 PM Albert [name]  MRN:  [id number]   48 year old Caucasian male with a psychiatric history significant for schizoaffective disorder, bipolar type, paranoid schizophrenia, and alcohol dependence, who presented to the emergency department on 01/18/2024 at 2:03 AM due to inability to sleep for at least two days, along with agitation, psychosis, and manic symptoms.The patient is currently noncompliant with his outpatient medications (Depakote and Seroquel), which is supported by both his self-report and a low valproic acid level. He presents with disorganized thinking, tangential speech, and delusional content. He endorses belief in a computer AI program called "MOM," which he claims gives him sexual sensations and communicates with him in public places, such as [place]. He references a sponsor named "Dene Gentry," whom he believes is associated with this program; however, collateral from the patient's mother confirms that Dene Gentry is not a real individual and that these beliefs are consistent with his prior psychotic episodes.   Subjective: Patient's case discussed with multidisciplinary team, all vitals and notes were reviewed.  No behavioral outbursts reported overnight.  Patient is seen for reassessment, he continues to be quite psychotic, with paranoid delusions, pressured speech, having difficulties comprehending information, disorganized thought process, loose associations, hyper religious, perseveration, mood lability.  He does report that increasing Seroquel caused him to be overly sedated, is requesting that this be reduced to 200 mg.  Reports good appetite.  He is denying SI/HI and AVH.  He has no insight into current illness, judgment is impaired.  He is only agreeable to taking Depakote and Seroquel.  Did discuss with him that Seroquel is not working for him and would need to switch this to another antipsychotic.  Patient refuses to discontinue  Seroquel, however at this time he is agreeable with adding Prolixin.  Will continue to encourage him to discontinue Seroquel.  He was observed today standing by the window at the nurses station, actively having a conversation with unseen others.   A1c 5.1 B12 230 on lower end of normal, will replace -patient declines oral replacement   Principal Problem: Schizoaffective disorder, bipolar type (HCC) Diagnosis: Principal Problem:   Schizoaffective disorder, bipolar type (HCC) Active Problems:   Medical non-compliance   Tachycardia  Total Time spent with patient: 30 min   Past Psychiatric History: see H&P  Past Medical History:  Past Medical History:  Diagnosis Date   Alcoholism (HCC)    Bipolar disorder (HCC)    Drug abuse (HCC)    xanax addiction 5 years ago   Heartburn    Medical non-compliance    Paranoid schizophrenia (HCC)    Schizophrenia (HCC)    Seizures (HCC)     Past Surgical History:  Procedure Laterality Date   NO PAST SURGERIES     Family History:  Family History  Problem Relation Age of Onset   Thyroid disease Mother    Depression Maternal Aunt    Family Psychiatric  History:  see H&P Social History:  Social History   Substance and Sexual Activity  Alcohol Use Not Currently   Comment: has been sober since June 4th     Social History   Substance and Sexual Activity  Drug Use Not Currently   Types: Marijuana, Benzodiazepines   Comment: THC: was smoking 1 ounce every day, last use: April & xanax use was 10 mg every day for "a few months"    Social History   Socioeconomic History   Marital status: Single    Spouse name:  Not on file   Number of children: Not on file   Years of education: Not on file   Highest education level: Not on file  Occupational History    Comment: Unclear  Tobacco Use   Smoking status: Former    Current packs/day: 0.00    Types: Cigarettes    Quit date: 02/21/2022    Years since quitting: 1.9   Smokeless tobacco:  Never   Tobacco comments:    He stopped smoking in April of 2023. He said that he used to smoke "a few packs per day."   Vaping Use   Vaping status: Never Used  Substance and Sexual Activity   Alcohol use: Not Currently    Comment: has been sober since June 4th   Drug use: Not Currently    Types: Marijuana, Benzodiazepines    Comment: THC: was smoking 1 ounce every day, last use: April & xanax use was 10 mg every day for "a few months"   Sexual activity: Not Currently  Other Topics Concern   Not on file  Social History Narrative   Pt reported that he lives in a house apartment.   Social Drivers of Corporate investment banker Strain: Low Risk  (05/26/2023)   [facility] from [facility]   Overall Financial Resource Strain (CARDIA)    Difficulty of Paying Living Expenses: Not hard at all  Food Insecurity: No Food Insecurity (01/19/2024)   Hunger Vital Sign    Worried About Running Out of Food in the Last Year: Never true    Ran Out of Food in the Last Year: Never true  Transportation Needs: No Transportation Needs (01/19/2024)   PRAPARE - Administrator, Civil Service (Medical): No    Lack of Transportation (Non-Medical): No  Physical Activity: Inactive (05/26/2023)   Received from [facility]   Exercise Vital Sign    Days of Exercise per Week: 0 days    Minutes of Exercise per Session: 0 min  Stress: Stress Concern Present (05/26/2023)   Received from [facility] of Occupational Health - Occupational Stress Questionnaire    Feeling of Stress : Rather much  Social Connections: Patient Unable To Answer (01/19/2024)   Social Connection and Isolation Panel [NHANES]    Frequency of Communication with Friends and Family: Patient unable to answer    Frequency of Social Gatherings with Friends and Family: Patient unable to answer    Attends Religious Services: Patient unable to answer    Active Member of Clubs or Organizations: Patient unable to  answer    Attends Banker Meetings: Patient unable to answer    Marital Status: Patient unable to answer   Additional Social History:                         Sleep: Poor  Appetite:  Fair  Current Medications: Current Facility-Administered Medications  Medication Dose Route Frequency Provider Last Rate Last Admin   acetaminophen (TYLENOL) tablet 650 mg  650 mg Oral Q6H PRN Jearld Lesch, NP       alum & mag hydroxide-simeth (MAALOX/MYLANTA) 200-200-20 MG/5ML suspension 30 mL  30 mL Oral Q4H PRN Dixon, Rashaun M, NP   30 mL at 01/22/24 1736   haloperidol (HALDOL) tablet 5 mg  5 mg Oral TID PRN Jearld Lesch, NP   5 mg at 01/22/24 2235   And   diphenhydrAMINE (BENADRYL) capsule  50 mg  50 mg Oral TID PRN Jearld Lesch, NP       haloperidol lactate (HALDOL) injection 5 mg  5 mg Intramuscular TID PRN Jearld Lesch, NP       And   diphenhydrAMINE (BENADRYL) injection 50 mg  50 mg Intramuscular TID PRN Jearld Lesch, NP       And   LORazepam (ATIVAN) injection 2 mg  2 mg Intramuscular TID PRN Jearld Lesch, NP       haloperidol lactate (HALDOL) injection 10 mg  10 mg Intramuscular TID PRN Jearld Lesch, NP       And   diphenhydrAMINE (BENADRYL) injection 50 mg  50 mg Intramuscular TID PRN Jearld Lesch, NP       And   LORazepam (ATIVAN) injection 2 mg  2 mg Intramuscular TID PRN Jearld Lesch, NP       divalproex (DEPAKOTE) DR tablet 750 mg  750 mg Oral BID Myriam Forehand, NP   750 mg at 01/23/24 0906   [START ON 01/26/2024] fluPHENAZine (PROLIXIN) tablet 10 mg  10 mg Oral BID Darah Simkin, PA-C       fluPHENAZine (PROLIXIN) tablet 2.5 mg  2.5 mg Oral BID Zsazsa Bahena, PA-C       [START ON 01/24/2024] fluPHENAZine (PROLIXIN) tablet 5 mg  5 mg Oral BID Arlyn Bumpus, PA-C       [START ON 01/25/2024] fluPHENAZine (PROLIXIN) tablet 7.5 mg  7.5 mg Oral BID Ransome Helwig, PA-C       magnesium hydroxide (MILK OF MAGNESIA)  suspension 30 mL  30 mL Oral Daily PRN Durwin Nora, Rashaun M, NP       QUEtiapine (SEROQUEL) tablet 200 mg  200 mg Oral QHS Mittie Knittel, PA-C       traZODone (DESYREL) tablet 50 mg  50 mg Oral QHS PRN Jearld Lesch, NP   50 mg at 01/18/24 2330    Lab Results:  No results found for this or any previous visit (from the past 48 hours).   Blood Alcohol level:  Lab Results  Component Value Date   ETH <10 01/18/2024   ETH <10 10/28/2023    Metabolic Disorder Labs: Lab Results  Component Value Date   HGBA1C 5.1 01/20/2024   MPG 99.67 01/20/2024   MPG 114 10/28/2023   Lab Results  Component Value Date   PROLACTIN 30.4 (H) 01/06/2017   PROLACTIN 13.1 01/05/2017   Lab Results  Component Value Date   CHOL 182 10/28/2023   TRIG 145 10/28/2023   HDL 38 (L) 10/28/2023   CHOLHDL 4.8 10/28/2023   VLDL 29 10/28/2023   LDLCALC 115 (H) 10/28/2023   LDLCALC 78 07/25/2022    Physical Findings: AIMS:  , ,  ,  ,    CIWA:    COWS:     Musculoskeletal: Strength & Muscle Tone: within normal limits Gait & Station: normal Patient leans: N/A  Psychiatric Specialty Exam:  Presentation  General Appearance:  Appropriate for Environment  Eye Contact: Good  Speech: Pressured  Speech Volume: Normal  Handedness: Left   Mood and Affect  Mood: Dysphoric; Irritable  Affect: Labile   Thought Process  Thought Processes: Disorganized  Descriptions of Associations:Loose  Orientation:Full (Time, Place and Person)  Thought Content:Delusions; Perseveration; Rumination; Scattered; Illogical  History of Schizophrenia/Schizoaffective disorder:Yes  Duration of Psychotic Symptoms:Greater than six months  Hallucinations:Hallucinations: Auditory Description of Auditory Hallucinations: pt denies but has been observed RTIS  Ideas of  Reference:Paranoia; Delusions  Suicidal Thoughts:Suicidal Thoughts: No  Homicidal Thoughts:Homicidal Thoughts: No   Sensorium   Memory: Immediate Fair; Recent Fair; Remote Fair  Judgment: Poor  Insight: Lacking   Executive Functions  Concentration: Poor  Attention Span: Poor  Recall: [employer] of Knowledge: Fair  Language: Fair   Psychomotor Activity  Psychomotor Activity: Psychomotor Activity: Normal   Assets  Assets: Housing; Social Support; Financial Resources/Insurance   Sleep  Sleep: Sleep: Good    Physical Exam: Physical Exam Vitals and nursing note reviewed.  Constitutional:      Appearance: Normal appearance. He is obese.  HENT:     Head: Normocephalic and atraumatic.  Eyes:     Extraocular Movements: Extraocular movements intact.  Pulmonary:     Effort: Pulmonary effort is normal.  Skin:    General: Skin is dry.  Neurological:     Mental Status: He is alert and oriented to person, place, and time.  Psychiatric:        Attention and Perception: He is inattentive. He perceives auditory hallucinations.        Mood and Affect: Mood is anxious. Affect is labile.        Speech: Speech is rapid and pressured and tangential.        Behavior: Behavior is uncooperative and agitated.        Thought Content: Thought content is paranoid and delusional.        Cognition and Memory: Memory normal. Cognition is impaired.        Judgment: Judgment is impulsive and inappropriate.     Comments: Affect is guarded, evasive Insight and judgment poor    Review of Systems  Psychiatric/Behavioral:  Positive for hallucinations. The patient is nervous/anxious.   All other systems reviewed and are negative.  Blood pressure (!) 111/55, pulse 66, temperature (!) 97.3 F (36.3 C), resp. rate 20, height 5\' 10"  (1.778 m), weight 100 kg, SpO2 97%. Body mass index is 31.63 kg/m.   Treatment Plan Summary:  1.    Safety and Monitoring:   --  Voluntary admission to inpatient psychiatric unit for safety, stabilization and treatment -- Daily contact with patient to assess and evaluate  symptoms and progress in treatment -- Patient's case to be discussed in multi-disciplinary team meeting -- Observation Level : q15 minute checks -- Vital signs:  q12 hours -- Precautions: assault    2. Psychiatric Diagnoses and Treatment:  01/23/2024 -- Continue Depakote ER 750 mg twice daily for mood stabilization -- Start Prolixin 2.5 mg BID x1 day, 5 mg BID x 1 day, 7.5 mg BID x 1 day, 10 mg BID for schizoaffective disorder  -- Reduce Seroquel 300 mg to 200 mg at bedtime for mood stabilization/psychosis, as requested by patient, plan to taper off  -- Continue trazodone 50 mg at bedtime as needed for sleep -- EKG NSR QTC 440  01/22/2024 -- Continue Depakote ER 750 mg twice daily for mood stabilization -- Discontinue Klonopin 0.5 mg 3 times daily for psychomotor agitation, pt refusing  -- Increase Seroquel 200 mg to 300 mg at bedtime for mood stabilization/psychosis -- Continue trazodone 50 mg at bedtime as needed for sleep -- EKG NSR QTC 440   01/21/2024 -- Continue Depakote ER 750 mg twice daily for mood stabilization -- Discontinue Klonopin 0.5 mg 3 times daily for psychomotor agitation, pt refusing  -- Continue Seroquel 200 mg at bedtime for mood stabilization/psychosis -- Continue trazodone 50 mg at bedtime as needed for sleep --  EKG NSR QTC 440  01/20/2024 -- Continue Depakote ER 750 mg twice daily for mood stabilization -- Increase Klonopin to 0.5 mg 3 times daily for psychomotor agitation -- Continue Seroquel 200 mg at bedtime for mood stabilization/psychosis -- Continue trazodone 50 mg at bedtime as needed for sleep -- Recheck EKG s/t prolonged Qtc update - EKG NSR QTC 440  --  The risks/benefits/side-effects/alternatives to this medication were discussed in detail with the patient and time was given for questions. The patient consents to medication trial. -- Metabolic profile and EKG monitoring obtained while on an atypical antipsychotic  -- Encouraged patient to  participate in unit milieu and in scheduled group therapies -- Short Term Goals: Ability to identify changes in lifestyle to reduce recurrence of condition will improve, Ability to verbalize feelings will improve, Ability to disclose and discuss suicidal ideas, Ability to demonstrate self-control will improve, Ability to identify and develop effective coping behaviors will improve, Ability to maintain clinical measurements within normal limits will improve, Compliance with prescribed medications will improve, and Ability to identify triggers associated with substance abuse/mental health issues will improve -- Long Term Goals: Improvement in symptoms so as ready for discharge        3. Medical Issues Being Addressed:   None    4. Discharge Planning:   -- Social work and case management to assist with discharge planning and identification of hospital follow-up needs prior to discharge -- Estimated LOS: 5-7 days -- Discharge Concerns: Need to establish a safety plan; Medication compliance and effectiveness -- Discharge Goals: Return home with outpatient referrals for mental health follow-up including medication management/psychotherapy    Paulene Floor, PA-C 01/23/2024, 5:21 PM

## 2024-01-23 NOTE — Group Note (Signed)
 Recreation Therapy Group Note   Group Topic:Goal Setting  Group Date: 01/23/2024 Start Time: 1000 End Time: 1100 Facilitators: Rosina Lowenstein, LRT, CTRS Location:  Craft Room  Group Description: Product/process development scientist. Patients were given many different magazines, a glue stick, markers, and a piece of cardstock paper. LRT and pts discussed the importance of having goals in life. LRT and pts discussed the difference between short-term and long-term goals, as well as what a SMART goal is. LRT encouraged pts to create a vision board, with images they picked and then cut out with safety scissors from the magazine, for themselves, that capture their short and long-term goals. LRT encouraged pts to show and explain their vision board to the group.   Goal Area(s) Addressed:  Patient will gain knowledge of short vs. long term goals.  Patient will identify goals for themselves. Patient will practice setting SMART goals. Patient will verbalize their goals to LRT and peers.   Affect/Mood: N/A   Participation Level: Did not attend    Clinical Observations/Individualized Feedback: Patient did not attend group.   Plan: Continue to engage patient in RT group sessions 2-3x/week.   Rosina Lowenstein, LRT, CTRS 01/23/2024 11:37 AM

## 2024-01-23 NOTE — Progress Notes (Signed)
 Pt calm and pleasant during assessment denying SI/HI/AVH. Pt observed by this Clinical research associate interacting appropriately with staff and peers on the unit. Pt compliant with medication administration per MD orders. Pt given education, support, and encouragement to be active in his treatment plan. Pt being monitored Q 15 minutes for safety per unit protocol, remains safe on the unit

## 2024-01-23 NOTE — Plan of Care (Signed)
  Problem: Education: Goal: Emotional status will improve Outcome: Progressing Goal: Mental status will improve Outcome: Progressing Goal: Verbalization of understanding the information provided will improve Outcome: Progressing   Problem: Activity: Goal: Interest or engagement in activities will improve Outcome: Progressing   Problem: Coping: Goal: Ability to verbalize frustrations and anger appropriately will improve Outcome: Progressing   Problem: Safety: Goal: Periods of time without injury will increase Outcome: Progressing

## 2024-01-23 NOTE — Progress Notes (Signed)
   01/23/24 1100  Psych Admission Type (Psych Patients Only)  Admission Status Voluntary  Psychosocial Assessment  Patient Complaints None  Eye Contact Intense  Facial Expression Wide-eyed  Affect Appropriate to circumstance  Speech Logical/coherent  Interaction Assertive  Motor Activity Slow  Appearance/Hygiene Unremarkable  Behavior Characteristics Cooperative;Anxious  Mood Pleasant  Thought Process  Coherency WDL  Content WDL  Delusions None reported or observed  Perception WDL  Hallucination None reported or observed  Judgment WDL  Danger to Self  Current suicidal ideation? Denies  Agreement Not to Harm Self Yes  Description of Agreement verbal  Danger to Others  Danger to Others None reported or observed

## 2024-01-24 NOTE — Plan of Care (Signed)
   Problem: Education: Goal: Knowledge of [facility] Education information/materials will improve Outcome: Progressing Goal: Emotional status will improve Outcome: Progressing Goal: Mental status will improve Outcome: Progressing Goal: Verbalization of understanding the information provided will improve Outcome: Progressing

## 2024-01-24 NOTE — Group Note (Signed)
 Recreation Therapy Group Note   Group Topic:Other  Group Date: 01/24/2024 Start Time: 1500 End Time: 1600 Facilitators: Rosina Lowenstein, LRT, CTRS Location: Courtyard  Group Description: [employer]. LRT and patients played games of basketball, drew with chalk, and played corn hole while outside in the courtyard while getting fresh air and sunlight. Music was being played in the background. LRT and peers conversed about different games they have played before, what they do in their free time and anything else that is on their minds. LRT encouraged pts to drink water after being outside, sweating and getting their heart rate up.  Goal Area(s) Addressed: Patient will build on frustration tolerance skills. Patients will partake in a competitive play game with peers. Patients will gain knowledge of new leisure interest/hobby.    Affect/Mood: Appropriate   Participation Level: Active   Participation Quality: Independent   Behavior: Appropriate   Speech/Thought Process: Coherent   Insight: Fair   Judgement: Fair    Modes of Intervention: Activity   Patient Response to Interventions:  Receptive   Education Outcome:  Acknowledges education   Clinical Observations/Individualized Feedback: Albert [name] was active in their participation of session activities and group discussion. Pt interacted well with LRT and peers duration of session.    Plan: Continue to engage patient in RT group sessions 2-3x/week.   Rosina Lowenstein, LRT, CTRS 01/24/2024 4:29 PM

## 2024-01-24 NOTE — Progress Notes (Signed)
 Patient reports new medication making him restless, reports up every 2 hours that's not normal.

## 2024-01-24 NOTE — Group Note (Signed)
 Recreation Therapy Group Note   Group Topic:Leisure Education  Group Date: 01/24/2024 Start Time: 1015 End Time: 1115 Facilitators: Clinton Gallant, CTRS Location:  Craft Room  Group Description: Leisure. Patients were given the option to choose from singing karaoke, coloring mandalas, using oil pastels, journaling, or playing with play-doh. LRT and pts discussed the meaning of leisure, the importance of participating in leisure during their free time/when they're outside of the hospital, as well as how our leisure interests can also serve as coping skills.   Goal Area(s) Addressed:  Patient will identify a current leisure interest.  Patient will learn the definition of "leisure". Patient will practice making a positive decision. Patient will have the opportunity to try a new leisure activity. Patient will communicate with peers and LRT.    Affect/Mood: Appropriate   Participation Level: Active and Engaged   Participation Quality: Independent   Behavior: Appropriate, Calm, and Cooperative   Speech/Thought Process: Coherent   Insight: Good   Judgement: Good   Modes of Intervention: Activity, Exploration, and Music   Patient Response to Interventions:  Attentive, Engaged, Interested , and Receptive   Education Outcome:  Acknowledges education   Clinical Observations/Individualized Feedback: [name] was active in their participation of session activities and group discussion. Pt identified "journal and read" as things he does in his free time. Pt chose to make origami while in group. Pt interacted well with LRT and peers duration of session.    Plan: Continue to engage patient in RT group sessions 2-3x/week.   Rosina Lowenstein, LRT, CTRS 01/24/2024 11:37 AM

## 2024-01-24 NOTE — Plan of Care (Signed)
   Problem: Education: Goal: Emotional status will improve Outcome: Progressing Goal: Mental status will improve Outcome: Progressing   Problem: Activity: Goal: Sleeping patterns will improve Outcome: Progressing

## 2024-01-24 NOTE — Progress Notes (Signed)
   01/24/24 5284  Psych Admission Type (Psych Patients Only)  Admission Status Voluntary  Psychosocial Assessment  Patient Complaints None  Eye Contact Staring;Watchful  Facial Expression Wide-eyed  Affect Appropriate to circumstance  Speech Logical/coherent  Interaction Assertive  Motor Activity Slow  Appearance/Hygiene Unremarkable  Behavior Characteristics Calm  Mood Pleasant  Thought Process  Coherency WDL  Content WDL  Delusions None reported or observed  Perception WDL  Hallucination None reported or observed  Judgment WDL  Confusion None  Danger to Self  Current suicidal ideation? Denies  Description of Suicide Plan Verbal  Danger to Others  Danger to Others None reported or observed

## 2024-01-24 NOTE — Progress Notes (Signed)
 [facility] MD Progress Note  01/24/2024 9:12 AM Cj Edgell  MRN:  [id number]   48 year old Caucasian male with a psychiatric history significant for schizoaffective disorder, bipolar type, paranoid schizophrenia, and alcohol dependence, who presented to the emergency department on 01/18/2024 at 2:03 AM due to inability to sleep for at least two days, along with agitation, psychosis, and manic symptoms.The patient is currently noncompliant with his outpatient medications (Depakote and Seroquel), which is supported by both his self-report and a low valproic acid level. He presents with disorganized thinking, tangential speech, and delusional content. He endorses belief in a computer AI program called "MOM," which he claims gives him sexual sensations and communicates with him in public places, such as [place]. He references a sponsor named "Dene Gentry," whom he believes is associated with this program; however, collateral from the patient's mother confirms that Dene Gentry is not a real individual and that these beliefs are consistent with his prior psychotic episodes.   Subjective: Patient's case discussed with multidisciplinary team, all vitals and notes were reviewed.  No behavioral outbursts reported overnight.  Patient is seen for reassessment, some improvement in mental status.  Beach is less pressured, normal rate, he is calm and cooperative today.  Continues to be hyperreligious, guarded, evasive, paranoid.  He denies any psychiatric symptoms.  Denies having any hallucinations.  He does report that he was able to sleep better overnight, appetite is good.  Denies any depression, anxiety, SI/HI and AVH.  States he has been diagnosed with autism.  He has declined to allow this provider to contact his outpatient psychiatric provider who he reports is Dr. Betti Cruz, declines to have this provider contact any family members.  Alert and oriented to person place and time.  A1c 5.1 B12 230 on lower end  of normal, will replace -patient declines oral replacement   Principal Problem: Schizoaffective disorder, bipolar type (HCC) Diagnosis: Principal Problem:   Schizoaffective disorder, bipolar type (HCC) Active Problems:   Medical non-compliance   Tachycardia  Total Time spent with patient: 30 min   Past Psychiatric History: see H&P  Past Medical History:  Past Medical History:  Diagnosis Date   Alcoholism (HCC)    Bipolar disorder (HCC)    Drug abuse (HCC)    xanax addiction 5 years ago   Heartburn    Medical non-compliance    Paranoid schizophrenia (HCC)    Schizophrenia (HCC)    Seizures (HCC)     Past Surgical History:  Procedure Laterality Date   NO PAST SURGERIES     Family History:  Family History  Problem Relation Age of Onset   Thyroid disease Mother    Depression Maternal Aunt    Family Psychiatric  History:  see H&P Social History:  Social History   Substance and Sexual Activity  Alcohol Use Not Currently   Comment: has been sober since June 4th     Social History   Substance and Sexual Activity  Drug Use Not Currently   Types: Marijuana, Benzodiazepines   Comment: THC: was smoking 1 ounce every day, last use: April & xanax use was 10 mg every day for "a few months"    Social History   Socioeconomic History   Marital status: Single    Spouse name: Not on file   Number of children: Not on file   Years of education: Not on file   Highest education level: Not on file  Occupational History    Comment: Unclear  Tobacco Use  Smoking status: Former    Current packs/day: 0.00    Types: Cigarettes    Quit date: 02/21/2022    Years since quitting: 1.9   Smokeless tobacco: Never   Tobacco comments:    He stopped smoking in April of 2023. He said that he used to smoke "a few packs per day."   Vaping Use   Vaping status: Never Used  Substance and Sexual Activity   Alcohol use: Not Currently    Comment: has been sober since June 4th   Drug use:  Not Currently    Types: Marijuana, Benzodiazepines    Comment: THC: was smoking 1 ounce every day, last use: April & xanax use was 10 mg every day for "a few months"   Sexual activity: Not Currently  Other Topics Concern   Not on file  Social History Narrative   Pt reported that he lives in a house apartment.   Social Drivers of Corporate investment banker Strain: Low Risk  (05/26/2023)   Received from [facility]   Overall Financial Resource Strain (CARDIA)    Difficulty of Paying Living Expenses: Not hard at all  Food Insecurity: No Food Insecurity (01/19/2024)   Hunger Vital Sign    Worried About Running Out of Food in the Last Year: Never true    Ran Out of Food in the Last Year: Never true  Transportation Needs: No Transportation Needs (01/19/2024)   PRAPARE - Administrator, Civil Service (Medical): No    Lack of Transportation (Non-Medical): No  Physical Activity: Inactive (05/26/2023)   Received from [facility]   Exercise Vital Sign    Days of Exercise per Week: 0 days    Minutes of Exercise per Session: 0 min  Stress: Stress Concern Present (05/26/2023)   Received from [facility] - Occupational Stress Questionnaire    Feeling of Stress : Rather much  Social Connections: Patient Unable To Answer (01/19/2024)   Social Connection and Isolation Panel [NHANES]    Frequency of Communication with Friends and Family: Patient unable to answer    Frequency of Social Gatherings with Friends and Family: Patient unable to answer    Attends Religious Services: Patient unable to answer    Active Member of Clubs or Organizations: Patient unable to answer    Attends Banker Meetings: Patient unable to answer    Marital Status: Patient unable to answer   Additional Social History:                         Sleep: Poor  Appetite:  Fair  Current Medications: Current Facility-Administered  Medications  Medication Dose Route Frequency Provider Last Rate Last Admin   acetaminophen (TYLENOL) tablet 650 mg  650 mg Oral Q6H PRN Jearld Lesch, NP       alum & mag hydroxide-simeth (MAALOX/MYLANTA) 200-200-20 MG/5ML suspension 30 mL  30 mL Oral Q4H PRN Dixon, Rashaun M, NP   30 mL at 01/24/24 0003   haloperidol (HALDOL) tablet 5 mg  5 mg Oral TID PRN Jearld Lesch, NP   5 mg at 01/22/24 2235   And   diphenhydrAMINE (BENADRYL) capsule 50 mg  50 mg Oral TID PRN Jearld Lesch, NP       haloperidol lactate (HALDOL) injection 5 mg  5 mg Intramuscular TID PRN Jearld Lesch, NP  And   diphenhydrAMINE (BENADRYL) injection 50 mg  50 mg Intramuscular TID PRN Jearld Lesch, NP       And   LORazepam (ATIVAN) injection 2 mg  2 mg Intramuscular TID PRN Jearld Lesch, NP       haloperidol lactate (HALDOL) injection 10 mg  10 mg Intramuscular TID PRN Jearld Lesch, NP       And   diphenhydrAMINE (BENADRYL) injection 50 mg  50 mg Intramuscular TID PRN Jearld Lesch, NP       And   LORazepam (ATIVAN) injection 2 mg  2 mg Intramuscular TID PRN Jearld Lesch, NP       divalproex (DEPAKOTE) DR tablet 750 mg  750 mg Oral BID Myriam Forehand, NP   750 mg at 01/24/24 0904   [START ON 01/26/2024] fluPHENAZine (PROLIXIN) tablet 10 mg  10 mg Oral BID Kaidyn Javid, PA-C       fluPHENAZine (PROLIXIN) tablet 5 mg  5 mg Oral BID Nasia Cannan, PA-C       [START ON 01/25/2024] fluPHENAZine (PROLIXIN) tablet 7.5 mg  7.5 mg Oral BID Timothee Gali, PA-C       magnesium hydroxide (MILK OF MAGNESIA) suspension 30 mL  30 mL Oral Daily PRN Durwin Nora, Rashaun M, NP       QUEtiapine (SEROQUEL) tablet 200 mg  200 mg Oral QHS Kenshin Splawn, PA-C   200 mg at 01/23/24 2116   traZODone (DESYREL) tablet 50 mg  50 mg Oral QHS PRN Jearld Lesch, NP   50 mg at 01/18/24 2330    Lab Results:  No results found for this or any previous visit (from the past 48 hours).   Blood  Alcohol level:  Lab Results  Component Value Date   ETH <10 01/18/2024   ETH <10 10/28/2023    Metabolic Disorder Labs: Lab Results  Component Value Date   HGBA1C 5.1 01/20/2024   MPG 99.67 01/20/2024   MPG 114 10/28/2023   Lab Results  Component Value Date   PROLACTIN 30.4 (H) 01/06/2017   PROLACTIN 13.1 01/05/2017   Lab Results  Component Value Date   CHOL 182 10/28/2023   TRIG 145 10/28/2023   HDL 38 (L) 10/28/2023   CHOLHDL 4.8 10/28/2023   VLDL 29 10/28/2023   LDLCALC 115 (H) 10/28/2023   LDLCALC 78 07/25/2022    Physical Findings: AIMS:  , ,  ,  ,    CIWA:    COWS:     Musculoskeletal: Strength & Muscle Tone: within normal limits Gait & Station: normal Patient leans: N/A  Psychiatric Specialty Exam:  Presentation  General Appearance:  Appropriate for Environment  Eye Contact: Good  Speech: Pressured  Speech Volume: Normal  Handedness: Left   Mood and Affect  Mood: Dysphoric; Irritable  Affect: Labile   Thought Process  Thought Processes: Disorganized  Descriptions of Associations:Loose  Orientation:Full (Time, Place and Person)  Thought Content:Delusions; Perseveration; Rumination; Scattered; Illogical  History of Schizophrenia/Schizoaffective disorder:Yes  Duration of Psychotic Symptoms:Greater than six months  Hallucinations:Hallucinations: Auditory Description of Auditory Hallucinations: pt denies but has been observed RTIS  Ideas of Reference:Paranoia; Delusions  Suicidal Thoughts:Suicidal Thoughts: No  Homicidal Thoughts:Homicidal Thoughts: No   Sensorium  Memory: Immediate Fair; Recent Fair; Remote Fair  Judgment: Poor  Insight: Lacking   Executive Functions  Concentration: Poor  Attention Span: Poor  Recall: [employer] of Knowledge: Fair  Language: Fair   Psychomotor Activity  Psychomotor Activity: Psychomotor  Activity: Normal   Assets  Assets: Housing; Social Support; Financial  Resources/Insurance   Sleep  Sleep: Sleep: Good    Physical Exam: Physical Exam Vitals and nursing note reviewed.  Constitutional:      Appearance: Normal appearance. He is obese.  HENT:     Head: Normocephalic and atraumatic.  Eyes:     Extraocular Movements: Extraocular movements intact.  Pulmonary:     Effort: Pulmonary effort is normal.  Skin:    General: Skin is dry.  Neurological:     General: No focal deficit present.     Mental Status: He is alert and oriented to person, place, and time. Mental status is at baseline.  Psychiatric:        Attention and Perception: Attention and perception normal.        Mood and Affect: Mood is anxious. Affect is flat.        Speech: Speech is tangential.        Behavior: Behavior is cooperative.        Thought Content: Thought content is paranoid and delusional.        Cognition and Memory: Memory normal. Cognition is impaired.        Judgment: Judgment is impulsive and inappropriate.     Comments: Affect is guarded, evasive Insight and judgment poor    Review of Systems  Psychiatric/Behavioral:  The patient is nervous/anxious.   All other systems reviewed and are negative.  Blood pressure 105/64, pulse 71, temperature 97.8 F (36.6 C), resp. rate 17, height 5\' 10"  (1.778 m), weight 100 kg, SpO2 98%. Body mass index is 31.63 kg/m.   Treatment Plan Summary:  1.    Safety and Monitoring:   --  Voluntary admission to inpatient psychiatric unit for safety, stabilization and treatment -- Daily contact with patient to assess and evaluate symptoms and progress in treatment -- Patient's case to be discussed in multi-disciplinary team meeting -- Observation Level : q15 minute checks -- Vital signs:  q12 hours -- Precautions: assault    2. Psychiatric Diagnoses and Treatment:  01/24/2024 -- Continue Depakote ER 750 mg twice daily for mood stabilization -- Continue Prolixin 2.5 mg BID last dose was this AM, 5 mg BID 3/28, 7.5  mg BID 3/29, 10 mg BID 3/30 for schizoaffective disorder  -- Reduce Seroquel 300 mg to 200 mg at bedtime for mood stabilization/psychosis, as requested by patient, plan to taper off  -- Continue trazodone 50 mg at bedtime as needed for sleep  01/23/2024 -- Continue Depakote ER 750 mg twice daily for mood stabilization -- Start Prolixin 2.5 mg BID x1 day, 5 mg BID x 1 day, 7.5 mg BID x 1 day, 10 mg BID for schizoaffective disorder  -- Reduce Seroquel 300 mg to 200 mg at bedtime for mood stabilization/psychosis, as requested by patient, plan to taper off  -- Continue trazodone 50 mg at bedtime as needed for sleep -- EKG NSR QTC 440  01/22/2024 -- Continue Depakote ER 750 mg twice daily for mood stabilization -- Discontinue Klonopin 0.5 mg 3 times daily for psychomotor agitation, pt refusing  -- Increase Seroquel 200 mg to 300 mg at bedtime for mood stabilization/psychosis -- Continue trazodone 50 mg at bedtime as needed for sleep -- EKG NSR QTC 440   01/21/2024 -- Continue Depakote ER 750 mg twice daily for mood stabilization -- Discontinue Klonopin 0.5 mg 3 times daily for psychomotor agitation, pt refusing  -- Continue Seroquel 200 mg at bedtime for  mood stabilization/psychosis -- Continue trazodone 50 mg at bedtime as needed for sleep -- EKG NSR QTC 440  01/20/2024 -- Continue Depakote ER 750 mg twice daily for mood stabilization -- Increase Klonopin to 0.5 mg 3 times daily for psychomotor agitation -- Continue Seroquel 200 mg at bedtime for mood stabilization/psychosis -- Continue trazodone 50 mg at bedtime as needed for sleep -- Recheck EKG s/t prolonged Qtc update - EKG NSR QTC 440  --  The risks/benefits/side-effects/alternatives to this medication were discussed in detail with the patient and time was given for questions. The patient consents to medication trial. -- Metabolic profile and EKG monitoring obtained while on an atypical antipsychotic  -- Encouraged patient to  participate in unit milieu and in scheduled group therapies -- Short Term Goals: Ability to identify changes in lifestyle to reduce recurrence of condition will improve, Ability to verbalize feelings will improve, Ability to disclose and discuss suicidal ideas, Ability to demonstrate self-control will improve, Ability to identify and develop effective coping behaviors will improve, Ability to maintain clinical measurements within normal limits will improve, Compliance with prescribed medications will improve, and Ability to identify triggers associated with substance abuse/mental health issues will improve -- Long Term Goals: Improvement in symptoms so as ready for discharge        3. Medical Issues Being Addressed:   None    4. Discharge Planning:   -- Social work and case management to assist with discharge planning and identification of hospital follow-up needs prior to discharge -- Estimated LOS: 5-7 days -- Discharge Concerns: Need to establish a safety plan; Medication compliance and effectiveness -- Discharge Goals: Return home with outpatient referrals for mental health follow-up including medication management/psychotherapy    Paulene Floor, PA-C 01/24/2024, 9:12 AM

## 2024-01-24 NOTE — [facility] Suicide Risk Assessment (Signed)
 [facility] INPATIENT:  Family/Significant Other Suicide Prevention Education  Suicide Prevention Education:  Patient Refusal for Family/Significant Other Suicide Prevention Education: The patient Albert [name] has refused to provide written consent for family/significant other to be provided Family/Significant Other Suicide Prevention Education during admission and/or prior to discharge.  Physician notified.  SPE completed with pt, as pt refused to consent to family contact. SPI pamphlet provided to pt and pt was encouraged to share information with support network, ask questions, and talk about any concerns relating to SPE. Pt denies access to guns/firearms and verbalized understanding of information provided. Mobile Crisis information also provided to pt.  Glenis Smoker 01/24/2024, 2:00 PM

## 2024-01-24 NOTE — Group Note (Signed)
 Date:  01/24/2024 Time:  8:59 PM  Group Topic/Focus:  Wrap-Up Group:   The focus of this group is to help patients review their daily goal of treatment and discuss progress on daily workbooks.    Participation Level:  Active  Participation Quality:  Appropriate  Affect:  Appropriate  Cognitive:  Appropriate  Insight: Appropriate and Good  Engagement in Group:  Engaged and Supportive  Modes of Intervention:  Discussion and Support  Additional Comments:     Belva Crome 01/24/2024, 8:59 PM

## 2024-01-25 MED ORDER — FLUPHENAZINE HCL 5 MG PO TABS
7.5000 mg | ORAL_TABLET | Freq: Two times a day (BID) | ORAL | Status: AC
  Administered 2024-01-25 – 2024-01-26 (×2): 7.5 mg via ORAL
  Filled 2024-01-25 (×2): qty 2

## 2024-01-25 NOTE — Plan of Care (Signed)

## 2024-01-25 NOTE — Progress Notes (Signed)
**Note De-identified  Obfuscation** EKG completed and placed in patient chart 

## 2024-01-25 NOTE — Plan of Care (Signed)
   Problem: Education: Goal: Emotional status will improve Outcome: Progressing Goal: Mental status will improve Outcome: Progressing   Problem: Activity: Goal: Sleeping patterns will improve Outcome: Progressing

## 2024-01-25 NOTE — Progress Notes (Signed)
 [facility] MD Progress Note  01/25/2024 9:02 AM Albert [name]  MRN:  [id number]   48 year old Caucasian male with a psychiatric history significant for schizoaffective disorder, bipolar type, paranoid schizophrenia, and alcohol dependence, who presented to the emergency department on 01/18/2024 at 2:03 AM due to inability to sleep for at least two days, along with agitation, psychosis, and manic symptoms.The patient is currently noncompliant with his outpatient medications (Depakote and Seroquel), which is supported by both his self-report and a low valproic acid level. He presents with disorganized thinking, tangential speech, and delusional content. He endorses belief in a computer AI program called "MOM," which he claims gives him sexual sensations and communicates with him in public places, such as [place]. He references a sponsor named "Dene Gentry," whom he believes is associated with this program; however, collateral from the patient's mother confirms that Dene Gentry is not a real individual and that these beliefs are consistent with his prior psychotic episodes.   Subjective: Patient's case discussed with multidisciplinary team, all vitals and notes were reviewed.  No behavioral outbursts reported overnight.  Patient is seen for reassessment, some improvement in mental status.  Beach is less pressured, normal rate, he is calm and cooperative today.  Continues to be hyperreligious, guarded, evasive, paranoid.  He denies any psychiatric symptoms.  Denies having any hallucinations.  He does report that he was able to sleep better overnight, appetite is good.  Denies any depression, anxiety, SI/HI and AVH.  States he has been diagnosed with autism.  He has declined to allow this provider to contact his outpatient psychiatric provider who he reports is Dr. Betti Cruz, declines to have this provider contact any family members.  Alert and oriented to person place and time.  A1c 5.1 B12 230 on lower end  of normal, will replace -patient declines oral replacement   Principal Problem: Schizoaffective disorder, bipolar type (HCC) Diagnosis: Principal Problem:   Schizoaffective disorder, bipolar type (HCC) Active Problems:   Medical non-compliance   Tachycardia  Total Time spent with patient: 30 min   Past Psychiatric History: see H&P  Past Medical History:  Past Medical History:  Diagnosis Date   Alcoholism (HCC)    Bipolar disorder (HCC)    Drug abuse (HCC)    xanax addiction 5 years ago   Heartburn    Medical non-compliance    Paranoid schizophrenia (HCC)    Schizophrenia (HCC)    Seizures (HCC)     Past Surgical History:  Procedure Laterality Date   NO PAST SURGERIES     Family History:  Family History  Problem Relation Age of Onset   Thyroid disease Mother    Depression Maternal Aunt    Family Psychiatric  History:  see H&P Social History:  Social History   Substance and Sexual Activity  Alcohol Use Not Currently   Comment: has been sober since June 4th     Social History   Substance and Sexual Activity  Drug Use Not Currently   Types: Marijuana, Benzodiazepines   Comment: THC: was smoking 1 ounce every day, last use: April & xanax use was 10 mg every day for "a few months"    Social History   Socioeconomic History   Marital status: Single    Spouse name: Not on file   Number of children: Not on file   Years of education: Not on file   Highest education level: Not on file  Occupational History    Comment: Unclear  Tobacco Use  Smoking status: Former    Current packs/day: 0.00    Types: Cigarettes    Quit date: 02/21/2022    Years since quitting: 1.9   Smokeless tobacco: Never   Tobacco comments:    He stopped smoking in April of 2023. He said that he used to smoke "a few packs per day."   Vaping Use   Vaping status: Never Used  Substance and Sexual Activity   Alcohol use: Not Currently    Comment: has been sober since June 4th   Drug use:  Not Currently    Types: Marijuana, Benzodiazepines    Comment: THC: was smoking 1 ounce every day, last use: April & xanax use was 10 mg every day for "a few months"   Sexual activity: Not Currently  Other Topics Concern   Not on file  Social History Narrative   Pt reported that he lives in a house apartment.   Social Drivers of Corporate investment banker Strain: Low Risk  (05/26/2023)   Received from [facility]   Overall Financial Resource Strain (CARDIA)    Difficulty of Paying Living Expenses: Not hard at all  Food Insecurity: No Food Insecurity (01/19/2024)   Hunger Vital Sign    Worried About Running Out of Food in the Last Year: Never true    Ran Out of Food in the Last Year: Never true  Transportation Needs: No Transportation Needs (01/19/2024)   PRAPARE - Administrator, Civil Service (Medical): No    Lack of Transportation (Non-Medical): No  Physical Activity: Inactive (05/26/2023)   [facility]   Exercise Vital Sign    Days of Exercise per Week: 0 days    Minutes of Exercise per Session: 0 min  Stress: Stress Concern Present (05/26/2023)   Received from [facility] - Occupational Stress Questionnaire    Feeling of Stress : Rather much  Social Connections: Patient Unable To Answer (01/19/2024)   Social Connection and Isolation Panel [NHANES]    Frequency of Communication with Friends and Family: Patient unable to answer    Frequency of Social Gatherings with Friends and Family: Patient unable to answer    Attends Religious Services: Patient unable to answer    Active Member of Clubs or Organizations: Patient unable to answer    Attends Banker Meetings: Patient unable to answer    Marital Status: Patient unable to answer   Additional Social History:                         Sleep: Poor  Appetite:  Fair  Current Medications: Current Facility-Administered  Medications  Medication Dose Route Frequency Provider Last Rate Last Admin   acetaminophen (TYLENOL) tablet 650 mg  650 mg Oral Q6H PRN Jearld Lesch, NP       alum & mag hydroxide-simeth (MAALOX/MYLANTA) 200-200-20 MG/5ML suspension 30 mL  30 mL Oral Q4H PRN Dixon, Rashaun M, NP   30 mL at 01/24/24 0003   haloperidol (HALDOL) tablet 5 mg  5 mg Oral TID PRN Jearld Lesch, NP   5 mg at 01/22/24 2235   And   diphenhydrAMINE (BENADRYL) capsule 50 mg  50 mg Oral TID PRN Jearld Lesch, NP       haloperidol lactate (HALDOL) injection 5 mg  5 mg Intramuscular TID PRN Jearld Lesch, NP  And   diphenhydrAMINE (BENADRYL) injection 50 mg  50 mg Intramuscular TID PRN Jearld Lesch, NP       And   LORazepam (ATIVAN) injection 2 mg  2 mg Intramuscular TID PRN Jearld Lesch, NP       haloperidol lactate (HALDOL) injection 10 mg  10 mg Intramuscular TID PRN Jearld Lesch, NP       And   diphenhydrAMINE (BENADRYL) injection 50 mg  50 mg Intramuscular TID PRN Jearld Lesch, NP       And   LORazepam (ATIVAN) injection 2 mg  2 mg Intramuscular TID PRN Jearld Lesch, NP       divalproex (DEPAKOTE) DR tablet 750 mg  750 mg Oral BID Myriam Forehand, NP   750 mg at 01/24/24 1618   [START ON 01/26/2024] fluPHENAZine (PROLIXIN) tablet 10 mg  10 mg Oral BID Ota Ebersole, PA-C       fluPHENAZine (PROLIXIN) tablet 5 mg  5 mg Oral BID Ernisha Sorn, PA-C   5 mg at 01/24/24 1618   fluPHENAZine (PROLIXIN) tablet 7.5 mg  7.5 mg Oral BID Neidra Girvan, PA-C       magnesium hydroxide (MILK OF MAGNESIA) suspension 30 mL  30 mL Oral Daily PRN Durwin Nora, Rashaun M, NP       QUEtiapine (SEROQUEL) tablet 200 mg  200 mg Oral QHS Kendrix Orman, PA-C   200 mg at 01/24/24 2103   traZODone (DESYREL) tablet 50 mg  50 mg Oral QHS PRN Jearld Lesch, NP   50 mg at 01/18/24 2330    Lab Results:  No results found for this or any previous visit (from the past 48 hours).   Blood Alcohol  level:  Lab Results  Component Value Date   ETH <10 01/18/2024   ETH <10 10/28/2023    Metabolic Disorder Labs: Lab Results  Component Value Date   HGBA1C 5.1 01/20/2024   MPG 99.67 01/20/2024   MPG 114 10/28/2023   Lab Results  Component Value Date   PROLACTIN 30.4 (H) 01/06/2017   PROLACTIN 13.1 01/05/2017   Lab Results  Component Value Date   CHOL 182 10/28/2023   TRIG 145 10/28/2023   HDL 38 (L) 10/28/2023   CHOLHDL 4.8 10/28/2023   VLDL 29 10/28/2023   LDLCALC 115 (H) 10/28/2023   LDLCALC 78 07/25/2022    Physical Findings: AIMS:  , ,  ,  ,    CIWA:    COWS:     Musculoskeletal: Strength & Muscle Tone: within normal limits Gait & Station: normal Patient leans: N/A  Psychiatric Specialty Exam:  Presentation  General Appearance:  Appropriate for Environment  Eye Contact: Good  Speech: Pressured  Speech Volume: Normal  Handedness: Left   Mood and Affect  Mood: Dysphoric; Irritable  Affect: Labile   Thought Process  Thought Processes: Disorganized  Descriptions of Associations:Loose  Orientation:Full (Time, Place and Person)  Thought Content:Delusions; Perseveration; Rumination; Scattered; Illogical  History of Schizophrenia/Schizoaffective disorder:Yes  Duration of Psychotic Symptoms:Greater than six months  Hallucinations:No data recorded  Ideas of Reference:Paranoia; Delusions  Suicidal Thoughts:No data recorded  Homicidal Thoughts:No data recorded   Sensorium  Memory: Immediate Fair; Recent Fair; Remote Fair  Judgment: Poor  Insight: Lacking   Executive Functions  Concentration: Poor  Attention Span: Poor  Recall: [employer] of Knowledge: Fair  Language: Fair   Psychomotor Activity  Psychomotor Activity: No data recorded   Assets  Assets: Housing; Social Support;  Financial Resources/Insurance   Sleep  Sleep: No data recorded    Physical Exam: Physical Exam Vitals and nursing  note reviewed.  Constitutional:      Appearance: Normal appearance. He is obese.  HENT:     Head: Normocephalic and atraumatic.  Eyes:     Extraocular Movements: Extraocular movements intact.  Pulmonary:     Effort: Pulmonary effort is normal.  Skin:    General: Skin is dry.  Neurological:     General: No focal deficit present.     Mental Status: He is alert and oriented to person, place, and time. Mental status is at baseline.  Psychiatric:        Attention and Perception: Attention and perception normal.        Mood and Affect: Mood is anxious. Affect is flat.        Speech: Speech is tangential.        Behavior: Behavior is cooperative.        Thought Content: Thought content is paranoid and delusional.        Cognition and Memory: Memory normal. Cognition is impaired.        Judgment: Judgment is impulsive and inappropriate.     Comments: Affect is guarded, evasive Insight and judgment poor    Review of Systems  Psychiatric/Behavioral:  The patient is nervous/anxious.   All other systems reviewed and are negative.  Blood pressure (!) 114/59, pulse 76, temperature 98.7 F (37.1 C), resp. rate 18, height 5\' 10"  (1.778 m), weight 100 kg, SpO2 98%. Body mass index is 31.63 kg/m.   Treatment Plan Summary:  1.    Safety and Monitoring:   --  Voluntary admission to inpatient psychiatric unit for safety, stabilization and treatment -- Daily contact with patient to assess and evaluate symptoms and progress in treatment -- Patient's case to be discussed in multi-disciplinary team meeting -- Observation Level : q15 minute checks -- Vital signs:  q12 hours -- Precautions: assault    2. Psychiatric Diagnoses and Treatment:  01/24/2024 -- Continue Depakote ER 750 mg twice daily for mood stabilization -- Continue Prolixin 2.5 mg BID last dose was this AM, 5 mg BID 3/28, 7.5 mg BID 3/29, 10 mg BID 3/30 for schizoaffective disorder  -- Reduce Seroquel 300 mg to 200 mg at bedtime  for mood stabilization/psychosis, as requested by patient, plan to taper off  -- Continue trazodone 50 mg at bedtime as needed for sleep  01/23/2024 -- Continue Depakote ER 750 mg twice daily for mood stabilization -- Start Prolixin 2.5 mg BID x1 day, 5 mg BID x 1 day, 7.5 mg BID x 1 day, 10 mg BID for schizoaffective disorder  -- Reduce Seroquel 300 mg to 200 mg at bedtime for mood stabilization/psychosis, as requested by patient, plan to taper off  -- Continue trazodone 50 mg at bedtime as needed for sleep -- EKG NSR QTC 440  01/22/2024 -- Continue Depakote ER 750 mg twice daily for mood stabilization -- Discontinue Klonopin 0.5 mg 3 times daily for psychomotor agitation, pt refusing  -- Increase Seroquel 200 mg to 300 mg at bedtime for mood stabilization/psychosis -- Continue trazodone 50 mg at bedtime as needed for sleep -- EKG NSR QTC 440   01/21/2024 -- Continue Depakote ER 750 mg twice daily for mood stabilization -- Discontinue Klonopin 0.5 mg 3 times daily for psychomotor agitation, pt refusing  -- Continue Seroquel 200 mg at bedtime for mood stabilization/psychosis -- Continue trazodone 50 mg at  bedtime as needed for sleep -- EKG NSR QTC 440  01/20/2024 -- Continue Depakote ER 750 mg twice daily for mood stabilization -- Increase Klonopin to 0.5 mg 3 times daily for psychomotor agitation -- Continue Seroquel 200 mg at bedtime for mood stabilization/psychosis -- Continue trazodone 50 mg at bedtime as needed for sleep -- Recheck EKG s/t prolonged Qtc update - EKG NSR QTC 440  --  The risks/benefits/side-effects/alternatives to this medication were discussed in detail with the patient and time was given for questions. The patient consents to medication trial. -- Metabolic profile and EKG monitoring obtained while on an atypical antipsychotic  -- Encouraged patient to participate in unit milieu and in scheduled group therapies -- Short Term Goals: Ability to identify changes in  lifestyle to reduce recurrence of condition will improve, Ability to verbalize feelings will improve, Ability to disclose and discuss suicidal ideas, Ability to demonstrate self-control will improve, Ability to identify and develop effective coping behaviors will improve, Ability to maintain clinical measurements within normal limits will improve, Compliance with prescribed medications will improve, and Ability to identify triggers associated with substance abuse/mental health issues will improve -- Long Term Goals: Improvement in symptoms so as ready for discharge        3. Medical Issues Being Addressed:   None    4. Discharge Planning:   -- Social work and case management to assist with discharge planning and identification of hospital follow-up needs prior to discharge -- Estimated LOS: 5-7 days -- Discharge Concerns: Need to establish a safety plan; Medication compliance and effectiveness -- Discharge Goals: Return home with outpatient referrals for mental health follow-up including medication management/psychotherapy    Paulene Floor, PA-C 01/25/2024, 9:02 AM

## 2024-01-25 NOTE — Progress Notes (Signed)
 Pt calm and pleasant during assessment denying SI/HI/AVH. Pt observed by this Clinical research associate interacting appropriately with staff and peers on the unit. Pt compliant with medication administration per MD orders. Pt given education, support, and encouragement to be active in his treatment plan. Pt being monitored Q 15 minutes for safety per unit protocol, remains safe on the unit

## 2024-01-25 NOTE — Progress Notes (Signed)
   01/25/24 1000  Psych Admission Type (Psych Patients Only)  Admission Status Voluntary  Psychosocial Assessment  Patient Complaints None;Other (Comment) (patient states that his depression and anxiety is "ok, within the spectrum" and that his hoeplessness is "not too bad, it's good".)  Eye Contact Fair;Watchful  Facial Expression Wide-eyed  Affect Appropriate to circumstance  Speech Logical/coherent  Interaction Arrogant  Motor Activity Slow  Appearance/Hygiene In scrubs;Unremarkable  Mood Pleasant (patient's goal for today is to "work on my autism journal and the hardships that I go through on a regular basis".)  Aggressive Behavior  Effect No apparent injury  Thought Process  Coherency WDL  Content WDL  Delusions None reported or observed  Perception WDL  Hallucination None reported or observed  Judgment WDL  Confusion None  Danger to Self  Current suicidal ideation? Denies  Agreement Not to Harm Self Yes  Description of Agreement Verbal  Danger to Others  Danger to Others None reported or observed

## 2024-01-25 NOTE — BH IP Treatment Plan (Signed)
 Interdisciplinary Treatment and Diagnostic Plan Update  01/25/2024 Time of Session: 2:53 pm Albert [name] MRN: [id number]  Principal Diagnosis: Schizoaffective disorder, bipolar type [facility])  Secondary Diagnoses: Principal Problem:   Schizoaffective disorder, bipolar type (HCC) Active Problems:   Medical non-compliance   Tachycardia   Current Medications:  Current Facility-Administered Medications  Medication Dose Route Frequency Provider Last Rate Last Admin   acetaminophen (TYLENOL) tablet 650 mg  650 mg Oral Q6H PRN Jearld Lesch, NP       alum & mag hydroxide-simeth (MAALOX/MYLANTA) 200-200-20 MG/5ML suspension 30 mL  30 mL Oral Q4H PRN Jearld Lesch, NP   30 mL at 01/24/24 0003   haloperidol (HALDOL) tablet 5 mg  5 mg Oral TID PRN Jearld Lesch, NP   5 mg at 01/22/24 2235   And   diphenhydrAMINE (BENADRYL) capsule 50 mg  50 mg Oral TID PRN Jearld Lesch, NP       haloperidol lactate (HALDOL) injection 5 mg  5 mg Intramuscular TID PRN Jearld Lesch, NP       And   diphenhydrAMINE (BENADRYL) injection 50 mg  50 mg Intramuscular TID PRN Jearld Lesch, NP       And   LORazepam (ATIVAN) injection 2 mg  2 mg Intramuscular TID PRN Jearld Lesch, NP       haloperidol lactate (HALDOL) injection 10 mg  10 mg Intramuscular TID PRN Jearld Lesch, NP       And   diphenhydrAMINE (BENADRYL) injection 50 mg  50 mg Intramuscular TID PRN Jearld Lesch, NP       And   LORazepam (ATIVAN) injection 2 mg  2 mg Intramuscular TID PRN Jearld Lesch, NP       divalproex (DEPAKOTE) DR tablet 750 mg  750 mg Oral BID Myriam Forehand, NP   750 mg at 01/25/24 0904   [START ON 01/26/2024] fluPHENAZine (PROLIXIN) tablet 10 mg  10 mg Oral BID Tingling, Stephanie, PA-C       fluPHENAZine (PROLIXIN) tablet 7.5 mg  7.5 mg Oral BID [name], RPH       magnesium hydroxide (MILK OF MAGNESIA) suspension 30 mL  30 mL Oral Daily PRN Durwin Nora, Rashaun M, NP       QUEtiapine  (SEROQUEL) tablet 200 mg  200 mg Oral QHS Tingling, Stephanie, PA-C   200 mg at 01/24/24 2103   traZODone (DESYREL) tablet 50 mg  50 mg Oral QHS PRN Jearld Lesch, NP   50 mg at 01/18/24 2330   PTA Medications: Medications Prior to Admission  Medication Sig Dispense Refill Last Dose/Taking   divalproex (DEPAKOTE) 500 MG DR tablet Take 500 mg by mouth 2 (two) times daily.      ibuprofen (ADVIL) 200 MG tablet Take 200-800 mg by mouth every 6 (six) hours as needed for moderate pain (pain score 4-6).      omeprazole (PRILOSEC) 40 MG capsule Take 40 mg by mouth daily as needed (for heartburn or reflux).      QUEtiapine (SEROQUEL) 200 MG tablet Take 200 mg by mouth at bedtime.       Patient Stressors: Health problems   Medication change or noncompliance    Patient Strengths: Capable of independent living  Motivation for treatment/growth   Treatment Modalities: Medication Management, Group therapy, Case management,  1 to 1 session with clinician, Psychoeducation, Recreational therapy.   Physician Treatment Plan for Primary Diagnosis: Schizoaffective disorder, bipolar type (HCC) Long  Term Goal(s): Improvement in symptoms so as ready for discharge   Short Term Goals: Ability to identify changes in lifestyle to reduce recurrence of condition will improve Ability to verbalize feelings will improve Ability to disclose and discuss suicidal ideas Ability to demonstrate self-control will improve Ability to identify and develop effective coping behaviors will improve Ability to maintain clinical measurements within normal limits will improve Compliance with prescribed medications will improve Ability to identify triggers associated with substance abuse/mental health issues will improve  Medication Management: Evaluate patient's response, side effects, and tolerance of medication regimen.  Therapeutic Interventions: 1 to 1 sessions, Unit Group sessions and Medication  administration.  Evaluation of Outcomes: Progressing  Physician Treatment Plan for Secondary Diagnosis: Principal Problem:   Schizoaffective disorder, bipolar type (HCC) Active Problems:   Medical non-compliance   Tachycardia  Long Term Goal(s): Improvement in symptoms so as ready for discharge   Short Term Goals: Ability to identify changes in lifestyle to reduce recurrence of condition will improve Ability to verbalize feelings will improve Ability to disclose and discuss suicidal ideas Ability to demonstrate self-control will improve Ability to identify and develop effective coping behaviors will improve Ability to maintain clinical measurements within normal limits will improve Compliance with prescribed medications will improve Ability to identify triggers associated with substance abuse/mental health issues will improve     Medication Management: Evaluate patient's response, side effects, and tolerance of medication regimen.  Therapeutic Interventions: 1 to 1 sessions, Unit Group sessions and Medication administration.  Evaluation of Outcomes: Progressing   RN Treatment Plan for Primary Diagnosis: Schizoaffective disorder, bipolar type (HCC) Long Term Goal(s): Knowledge of disease and therapeutic regimen to maintain health will improve  Short Term Goals: Ability to remain free from injury will improve, Ability to verbalize frustration and anger appropriately will improve, Ability to demonstrate self-control, Ability to participate in decision making will improve, Ability to verbalize feelings will improve, Ability to disclose and discuss suicidal ideas, Ability to identify and develop effective coping behaviors will improve, and Compliance with prescribed medications will improve  Medication Management: RN will administer medications as ordered by provider, will assess and evaluate patient's response and provide education to patient for prescribed medication. RN will report any  adverse and/or side effects to prescribing provider.  Therapeutic Interventions: 1 on 1 counseling sessions, Psychoeducation, Medication administration, Evaluate responses to treatment, Monitor vital signs and CBGs as ordered, Perform/monitor CIWA, COWS, AIMS and Fall Risk screenings as ordered, Perform wound care treatments as ordered.  Evaluation of Outcomes: Progressing   LCSW Treatment Plan for Primary Diagnosis: Schizoaffective disorder, bipolar type (HCC) Long Term Goal(s): Safe transition to appropriate next level of care at discharge, Engage patient in therapeutic group addressing interpersonal concerns.  Short Term Goals: Engage patient in aftercare planning with referrals and resources, Increase social support, Increase ability to appropriately verbalize feelings, Increase emotional regulation, Facilitate acceptance of mental health diagnosis and concerns, Facilitate patient progression through stages of change regarding substance use diagnoses and concerns, Identify triggers associated with mental health/substance abuse issues, and Increase skills for wellness and recovery  Therapeutic Interventions: Assess for all discharge needs, 1 to 1 time with Social worker, Explore available resources and support systems, Assess for adequacy in community support network, Educate family and significant other(s) on suicide prevention, Complete Psychosocial Assessment, Interpersonal group therapy.  Evaluation of Outcomes: Progressing   Progress in Treatment: Attending groups: Yes. Participating in groups: Yes. and No. Taking medication as prescribed: Yes. Toleration medication: Yes. Family/Significant other contact  made: No, will contact:  once permission is provided. Patient understands diagnosis: Yes. Discussing patient identified problems/goals with staff: Yes. Medical problems stabilized or resolved: Yes. Denies suicidal/homicidal ideation: Yes. Issues/concerns per patient self-inventory:  No. Other: None  New problem(s) identified: No, Describe:  None  Update 01/25/24: No changes at this time.   New Short Term/Long Term Goal(s): detox, elimination of symptoms of psychosis, medication management for mood stabilization; elimination of SI thoughts; development of comprehensive mental wellness/sobriety plan.   Update 01/25/24: No changes at this time.   Patient Goals:  "To clear my mind and relax my mind."   Update 01/25/24: No changes at this time.   Discharge Plan or Barriers: CSW to assist in the development of appropriate discharge plan.  Update 01/25/24: No changes at this time.     Reason for Continuation of Hospitalization: Aggression Anxiety Delusions  Depression Hallucinations Mania Medical Issues Suicidal ideation   Estimated Length of Stay: 1-7 days.  Update 01/25/24: TBD Last 3 [place] Suicide Severity Risk Score: Flowsheet Row Admission (Current) from 01/18/2024 in [facility] INPATIENT BEHAVIORAL MEDICINE Most recent reading at 01/18/2024 10:30 PM ED from 01/18/2024 in [facility] [address] Department at [facility] Most recent reading at 01/18/2024  3:10 AM ED from 10/28/2023 in [facility] Most recent reading at 10/28/2023  9:27 AM  C-SSRS RISK CATEGORY No Risk No Risk No Risk       Last PHQ 2/9 Scores:     No data to display          Scribe for Treatment Team: Marshell Levan, Alexander Mt 01/25/2024 2:52 PM

## 2024-01-26 MED ORDER — FLUPHENAZINE HCL 5 MG PO TABS
7.5000 mg | ORAL_TABLET | Freq: Two times a day (BID) | ORAL | Status: DC
  Administered 2024-01-26 – 2024-01-27 (×2): 7.5 mg via ORAL
  Filled 2024-01-26 (×3): qty 1

## 2024-01-26 MED ORDER — QUETIAPINE FUMARATE 100 MG PO TABS
100.0000 mg | ORAL_TABLET | Freq: Every day | ORAL | Status: DC
  Administered 2024-01-26 – 2024-01-27 (×2): 100 mg via ORAL
  Filled 2024-01-26 (×2): qty 1

## 2024-01-26 NOTE — Progress Notes (Signed)
 [facility] MD Progress Note  01/26/2024 10:37 AM Albert [name]  MRN:  [id number]   48 year old Caucasian male with a psychiatric history significant for schizoaffective disorder, bipolar type, paranoid schizophrenia, and alcohol dependence, who presented to the emergency department on 01/18/2024 at 2:03 AM due to inability to sleep for at least two days, along with agitation, psychosis, and manic symptoms.The patient is currently noncompliant with his outpatient medications (Depakote and Seroquel), which is supported by both his self-report and a low valproic acid level. He presents with disorganized thinking, tangential speech, and delusional content. He endorses belief in a computer AI program called "MOM," which he claims gives him sexual sensations and communicates with him in public places, such as [place]. He references a sponsor named "Dene Gentry," whom he believes is associated with this program; however, collateral from the patient's mother confirms that Dene Gentry is not a real individual and that these beliefs are consistent with his prior psychotic episodes.   Subjective: Patient's case discussed with multidisciplinary team, all vitals and notes were reviewed.  No behavioral outbursts reported overnight.  Patient is seen for reassessment, appears to be somewhat irritable today.  He states that he keeps being bothered by staff entering his room even though he is trying to rest.  Reports good sleep and appetite.  Denies any depression, anxiety, SI/HI and AVH.  He continues to be guarded, paranoid, delusional.  He is however med compliant, interacts appropriately with others, attends groups, has not been observed RTIS in the last couple days.  Today he is agreeable with allowing staff to contact his outpatient mental health provider which he reports is Dr. Betti Cruz.  Will need to obtain collateral regarding his baseline, however unable to contact him today as it is a weekend.  A1c 5.1 B12  230 on lower end of normal, will replace -patient declines oral replacement   Principal Problem: Schizoaffective disorder, bipolar type (HCC) Diagnosis: Principal Problem:   Schizoaffective disorder, bipolar type (HCC) Active Problems:   Medical non-compliance   Tachycardia  Total Time spent with patient: 30 min   Past Psychiatric History: see H&P  Past Medical History:  Past Medical History:  Diagnosis Date   Alcoholism (HCC)    Bipolar disorder (HCC)    Drug abuse (HCC)    xanax addiction 5 years ago   Heartburn    Medical non-compliance    Paranoid schizophrenia (HCC)    Schizophrenia (HCC)    Seizures (HCC)     Past Surgical History:  Procedure Laterality Date   NO PAST SURGERIES     Family History:  Family History  Problem Relation Age of Onset   Thyroid disease Mother    Depression Maternal Aunt    Family Psychiatric  History:  see H&P Social History:  Social History   Substance and Sexual Activity  Alcohol Use Not Currently   Comment: has been sober since June 4th     Social History   Substance and Sexual Activity  Drug Use Not Currently   Types: Marijuana, Benzodiazepines   Comment: THC: was smoking 1 ounce every day, last use: April & xanax use was 10 mg every day for "a few months"    Social History   Socioeconomic History   Marital status: Single    Spouse name: Not on file   Number of children: Not on file   Years of education: Not on file   Highest education level: Not on file  Occupational History    Comment: Unclear  Tobacco Use   Smoking status: Former    Current packs/day: 0.00    Types: Cigarettes    Quit date: 02/21/2022    Years since quitting: 1.9   Smokeless tobacco: Never   Tobacco comments:    He stopped smoking in April of 2023. He said that he used to smoke "a few packs per day."   Vaping Use   Vaping status: Never Used  Substance and Sexual Activity   Alcohol use: Not Currently    Comment: has been sober since June  4th   Drug use: Not Currently    Types: Marijuana, Benzodiazepines    Comment: THC: was smoking 1 ounce every day, last use: April & xanax use was 10 mg every day for "a few months"   Sexual activity: Not Currently  Other Topics Concern   Not on file  Social History Narrative   Pt reported that he lives in a house apartment.   Social Drivers of Corporate investment banker Strain: Low Risk  (05/26/2023)   Received from [facility]   Overall Financial Resource Strain (CARDIA)    Difficulty of Paying Living Expenses: Not hard at all  Food Insecurity: No Food Insecurity (01/19/2024)   Hunger Vital Sign    Worried About Running Out of Food in the Last Year: Never true    Ran Out of Food in the Last Year: Never true  Transportation Needs: No Transportation Needs (01/19/2024)   PRAPARE - Administrator, Civil Service (Medical): No    Lack of Transportation (Non-Medical): No  Physical Activity: Inactive (05/26/2023)   Received from [facility]   Exercise Vital Sign    Days of Exercise per Week: 0 days    Minutes of Exercise per Session: 0 min  Stress: Stress Concern Present (05/26/2023)   Received from [name] Occupational [facility] - Occupational Stress Questionnaire    Feeling of Stress : Rather much  Social Connections: Patient Unable To Answer (01/19/2024)   Social Connection and Isolation Panel [NHANES]    Frequency of Communication with Friends and Family: Patient unable to answer    Frequency of Social Gatherings with Friends and Family: Patient unable to answer    Attends Religious Services: Patient unable to answer    Active Member of Clubs or Organizations: Patient unable to answer    Attends Banker Meetings: Patient unable to answer    Marital Status: Patient unable to answer   Additional Social History:                         Sleep: Poor  Appetite:  Fair  Current Medications: Current  Facility-Administered Medications  Medication Dose Route Frequency Provider Last Rate Last Admin   acetaminophen (TYLENOL) tablet 650 mg  650 mg Oral Q6H PRN Jearld Lesch, NP   650 mg at 01/26/24 0906   alum & mag hydroxide-simeth (MAALOX/MYLANTA) 200-200-20 MG/5ML suspension 30 mL  30 mL Oral Q4H PRN Jearld Lesch, NP   30 mL at 01/25/24 2151   haloperidol (HALDOL) tablet 5 mg  5 mg Oral TID PRN Jearld Lesch, NP   5 mg at 01/22/24 2235   And   diphenhydrAMINE (BENADRYL) capsule 50 mg  50 mg Oral TID PRN Jearld Lesch, NP       haloperidol lactate (HALDOL) injection 5 mg  5 mg Intramuscular TID PRN Dixon, Rashaun M,  NP       And   diphenhydrAMINE (BENADRYL) injection 50 mg  50 mg Intramuscular TID PRN Jearld Lesch, NP       And   LORazepam (ATIVAN) injection 2 mg  2 mg Intramuscular TID PRN Jearld Lesch, NP       haloperidol lactate (HALDOL) injection 10 mg  10 mg Intramuscular TID PRN Jearld Lesch, NP       And   diphenhydrAMINE (BENADRYL) injection 50 mg  50 mg Intramuscular TID PRN Jearld Lesch, NP       And   LORazepam (ATIVAN) injection 2 mg  2 mg Intramuscular TID PRN Jearld Lesch, NP       divalproex (DEPAKOTE) DR tablet 750 mg  750 mg Oral BID Myriam Forehand, NP   750 mg at 01/26/24 1610   fluPHENAZine (PROLIXIN) tablet 10 mg  10 mg Oral BID Claudina Oliphant, PA-C       magnesium hydroxide (MILK OF MAGNESIA) suspension 30 mL  30 mL Oral Daily PRN Jearld Lesch, NP       QUEtiapine (SEROQUEL) tablet 200 mg  200 mg Oral QHS Roann Merk, PA-C   200 mg at 01/25/24 2151   traZODone (DESYREL) tablet 50 mg  50 mg Oral QHS PRN Jearld Lesch, NP   50 mg at 01/18/24 2330    Lab Results:  No results found for this or any previous visit (from the past 48 hours).   Blood Alcohol level:  Lab Results  Component Value Date   ETH <10 01/18/2024   ETH <10 10/28/2023    Metabolic Disorder Labs: Lab Results  Component Value Date   HGBA1C 5.1  01/20/2024   MPG 99.67 01/20/2024   MPG 114 10/28/2023   Lab Results  Component Value Date   PROLACTIN 30.4 (H) 01/06/2017   PROLACTIN 13.1 01/05/2017   Lab Results  Component Value Date   CHOL 182 10/28/2023   TRIG 145 10/28/2023   HDL 38 (L) 10/28/2023   CHOLHDL 4.8 10/28/2023   VLDL 29 10/28/2023   LDLCALC 115 (H) 10/28/2023   LDLCALC 78 07/25/2022    Physical Findings: AIMS:  , ,  ,  ,    CIWA:    COWS:     Musculoskeletal: Strength & Muscle Tone: within normal limits Gait & Station: normal Patient leans: N/A  Psychiatric Specialty Exam:  Presentation  General Appearance:  Appropriate for Environment  Eye Contact: Good  Speech: Pressured  Speech Volume: Normal  Handedness: Left   Mood and Affect  Mood: Dysphoric; Irritable  Affect: Labile   Thought Process  Thought Processes: Disorganized  Descriptions of Associations:Loose  Orientation:Full (Time, Place and Person)  Thought Content:Delusions; Perseveration; Rumination; Scattered; Illogical  History of Schizophrenia/Schizoaffective disorder:Yes  Duration of Psychotic Symptoms:Greater than six months  Hallucinations:No data recorded  Ideas of Reference:Paranoia; Delusions  Suicidal Thoughts:No data recorded  Homicidal Thoughts:No data recorded   Sensorium  Memory: Immediate Fair; Recent Fair; Remote Fair  Judgment: Poor  Insight: Lacking   Executive Functions  Concentration: Poor  Attention Span: Poor  Recall: [employer] of Knowledge: Fair  Language: Fair   Psychomotor Activity  Psychomotor Activity: No data recorded   Assets  Assets: Housing; Social Support; Financial Resources/Insurance   Sleep  Sleep: No data recorded    Physical Exam: Physical Exam Vitals and nursing note reviewed.  Constitutional:      Appearance: Normal appearance. He is obese.  HENT:  Head: Normocephalic and atraumatic.  Eyes:     Extraocular Movements:  Extraocular movements intact.  Pulmonary:     Effort: Pulmonary effort is normal.  Skin:    General: Skin is dry.  Neurological:     General: No focal deficit present.     Mental Status: He is alert and oriented to person, place, and time. Mental status is at baseline.  Psychiatric:        Attention and Perception: Attention and perception normal.        Mood and Affect: Affect is angry.        Speech: Speech normal.        Behavior: Behavior normal.        Thought Content: Thought content is paranoid and delusional.        Cognition and Memory: Memory normal. Cognition is impaired.        Judgment: Judgment is impulsive and inappropriate.     Comments: Affect is guarded, irritable Insight fair and judgment poor    Review of Systems  All other systems reviewed and are negative.  Blood pressure 108/64, pulse 88, temperature (!) 97.5 F (36.4 C), resp. rate 18, height 5\' 10"  (1.778 m), weight 100 kg, SpO2 98%. Body mass index is 31.63 kg/m.   Treatment Plan Summary:  1.    Safety and Monitoring:   --  Voluntary admission to inpatient psychiatric unit for safety, stabilization and treatment -- Daily contact with patient to assess and evaluate symptoms and progress in treatment -- Patient's case to be discussed in multi-disciplinary team meeting -- Observation Level : q15 minute checks -- Vital signs:  q12 hours -- Precautions: assault    2. Psychiatric Diagnoses and Treatment:  01/26/2024 -- Continue Depakote ER 750 mg twice daily for mood stabilization -- Continue Prolixin 7.5 mg BID for schizoaffective disorder , will plan for LAI -- Will further reduce Seroquel 200 mg to 100 mg at bedtime for mood stabilization/psychosis, as requested by patient, plan to taper off -- Continue trazodone 50 mg at bedtime as needed for sleep  01/25/2024 -- Continue Depakote ER 750 mg twice daily for mood stabilization -- Continue 5 mg BID 3/28, 7.5 mg BID tonight, 10 mg BID 3/30 for  schizoaffective disorder  -- Reduce Seroquel 300 mg to 200 mg at bedtime for mood stabilization/psychosis, as requested by patient, plan to taper off  -- Continue trazodone 50 mg at bedtime as needed for sleep  01/24/2024 -- Continue Depakote ER 750 mg twice daily for mood stabilization -- Continue Prolixin 2.5 mg BID last dose was this AM, 5 mg BID 3/28, 7.5 mg BID 3/29, 10 mg BID 3/30 for schizoaffective disorder  -- Reduce Seroquel 300 mg to 200 mg at bedtime for mood stabilization/psychosis, as requested by patient, plan to taper off  -- Continue trazodone 50 mg at bedtime as needed for sleep  01/23/2024 -- Continue Depakote ER 750 mg twice daily for mood stabilization -- Start Prolixin 2.5 mg BID x1 day, 5 mg BID x 1 day, 7.5 mg BID x 1 day, 10 mg BID for schizoaffective disorder  -- Reduce Seroquel 300 mg to 200 mg at bedtime for mood stabilization/psychosis, as requested by patient, plan to taper off  -- Continue trazodone 50 mg at bedtime as needed for sleep -- EKG NSR QTC 440  01/22/2024 -- Continue Depakote ER 750 mg twice daily for mood stabilization -- Discontinue Klonopin 0.5 mg 3 times daily for psychomotor agitation, pt refusing  --  Increase Seroquel 200 mg to 300 mg at bedtime for mood stabilization/psychosis -- Continue trazodone 50 mg at bedtime as needed for sleep -- EKG NSR QTC 440   01/21/2024 -- Continue Depakote ER 750 mg twice daily for mood stabilization -- Discontinue Klonopin 0.5 mg 3 times daily for psychomotor agitation, pt refusing  -- Continue Seroquel 200 mg at bedtime for mood stabilization/psychosis -- Continue trazodone 50 mg at bedtime as needed for sleep -- EKG NSR QTC 440  01/20/2024 -- Continue Depakote ER 750 mg twice daily for mood stabilization -- Increase Klonopin to 0.5 mg 3 times daily for psychomotor agitation -- Continue Seroquel 200 mg at bedtime for mood stabilization/psychosis -- Continue trazodone 50 mg at bedtime as needed for  sleep -- Recheck EKG s/t prolonged Qtc update - EKG NSR QTC 440  --  The risks/benefits/side-effects/alternatives to this medication were discussed in detail with the patient and time was given for questions. The patient consents to medication trial. -- Metabolic profile and EKG monitoring obtained while on an atypical antipsychotic  -- Encouraged patient to participate in unit milieu and in scheduled group therapies -- Short Term Goals: Ability to identify changes in lifestyle to reduce recurrence of condition will improve, Ability to verbalize feelings will improve, Ability to disclose and discuss suicidal ideas, Ability to demonstrate self-control will improve, Ability to identify and develop effective coping behaviors will improve, Ability to maintain clinical measurements within normal limits will improve, Compliance with prescribed medications will improve, and Ability to identify triggers associated with substance abuse/mental health issues will improve -- Long Term Goals: Improvement in symptoms so as ready for discharge        3. Medical Issues Being Addressed:   None    4. Discharge Planning:   -- Social work and case management to assist with discharge planning and identification of hospital follow-up needs prior to discharge -- Estimated LOS: 5-7 days -- Discharge Concerns: Need to establish a safety plan; Medication compliance and effectiveness -- Discharge Goals: Return home with outpatient referrals for mental health follow-up including medication management/psychotherapy    Paulene Floor, PA-C 01/26/2024, 10:37 AM

## 2024-01-26 NOTE — Group Note (Signed)
 Date:  01/26/2024 Time:  10:13 AM  Group Topic/Focus:  Dimensions of Wellness:   The focus of this group is to introduce the topic of wellness and discuss the role each dimension of wellness plays in total health.    Participation Level:  Active  Participation Quality:  Appropriate  Affect:  Appropriate  Cognitive:  Appropriate  Insight: Appropriate  Engagement in Group:  Engaged  Modes of Intervention:  Activity  Additional Comments:    Lynelle Smoke Creola Krotz 01/26/2024, 10:13 AM

## 2024-01-26 NOTE — Group Note (Signed)
 Date:  01/26/2024 Time:  9:59 AM  Group Topic/Focus:  Dimensions of Wellness:   The focus of this group is to introduce the topic of wellness and discuss the role each dimension of wellness plays in total health.    Participation Level:  Active  Participation Quality:  Appropriate  Affect:  Appropriate  Cognitive:  Appropriate  Insight: Appropriate  Engagement in Group:  Engaged  Modes of Intervention:  Activity  Additional Comments:    Wilford Corner 01/26/2024, 9:59 AM

## 2024-01-26 NOTE — [facility] Group Notes (Signed)
 [facility] Group Notes:  (Nursing/MHT/Case Management/Adjunct)  Date:  01/26/2024  Time:  10:25 PM  Type of Therapy:  Group Therapy  Participation Level:  Active  Participation Quality:  Appropriate and Attentive  Affect:  Appropriate  Cognitive:  Alert and Appropriate  Insight:  Appropriate and Good  Engagement in Group:  Engaged  Modes of Intervention:  Discussion  Summary of Progress/Problems: Wrapup group "How was your day"  Tacy Dura 01/26/2024, 10:25 PM

## 2024-01-26 NOTE — Progress Notes (Signed)
   01/26/24 1800  Psych Admission Type (Psych Patients Only)  Admission Status Voluntary  Psychosocial Assessment  Patient Complaints None  Eye Contact Fair  Facial Expression Flat  Affect Appropriate to circumstance  Speech Logical/coherent  Interaction Assertive  Motor Activity Slow  Appearance/Hygiene Improved  Behavior Characteristics Cooperative  Mood Preoccupied  Thought Process  Coherency WDL  Content WDL  Delusions None reported or observed  Perception WDL  Hallucination None reported or observed  Judgment WDL  Confusion None  Danger to Self  Current suicidal ideation? Denies  Danger to Others  Danger to Others None reported or observed

## 2024-01-26 NOTE — Plan of Care (Signed)
   Problem: Education: Goal: Emotional status will improve Outcome: Progressing   Problem: Education: Goal: Mental status will improve Outcome: Progressing   Problem: Activity: Goal: Interest or engagement in activities will improve Outcome: Progressing

## 2024-01-26 NOTE — Progress Notes (Signed)
   01/25/24 2000  Psych Admission Type (Psych Patients Only)  Admission Status Voluntary  Psychosocial Assessment  Patient Complaints Other (Comment)  Eye Contact Fair  Facial Expression Flat  Affect Appropriate to circumstance  Speech Logical/coherent  Interaction Assertive  Motor Activity Slow  Appearance/Hygiene Improved  Behavior Characteristics Cooperative;Calm  Mood Pleasant  Aggressive Behavior  Effect No apparent injury  Thought Process  Coherency WDL  Content WDL  Delusions None reported or observed  Perception WDL  Hallucination None reported or observed  Judgment WDL  Confusion None  Danger to Self  Current suicidal ideation? Denies  Agreement Not to Harm Self Yes  Description of Agreement verbal  Danger to Others  Danger to Others None reported or observed   15 minutes safety checks maintained no distress noted, Patient is interacting appropriately, thoughts are organized and coherent.

## 2024-01-26 NOTE — [facility] Group Notes (Signed)
 [facility] Group Notes:  (Nursing/MHT/Case Management/Adjunct)  Date:  01/26/2024  Time:  12:02 AM  Type of Therapy:  Group Therapy  Participation Level:  Minimal  Participation Quality:  Appropriate  Affect:  Appropriate  Cognitive:  Appropriate  Insight:  Appropriate  Engagement in Group:  Engaged  Modes of Intervention:  Discussion  Summary of Progress/Problems:  Wrapup group about "Anger Management"  Tacy Dura 01/26/2024, 12:02 AM

## 2024-01-26 NOTE — Plan of Care (Signed)
  Problem: Education: Goal: Mental status will improve Outcome: Progressing   Problem: Education: Goal: Emotional status will improve Outcome: Progressing

## 2024-01-27 MED ORDER — FLUPHENAZINE HCL 5 MG PO TABS
7.5000 mg | ORAL_TABLET | Freq: Two times a day (BID) | ORAL | Status: DC
  Administered 2024-01-27 – 2024-01-28 (×2): 7.5 mg via ORAL
  Filled 2024-01-27 (×3): qty 2

## 2024-01-27 NOTE — Group Note (Signed)
 Recreation Therapy Group Note   Group Topic:Health and Wellness  Group Date: 01/27/2024 Start Time: 1100 End Time: 1200 Facilitators: Rosina Lowenstein, LRT, CTRS Location: Courtyard  Group Description: [employer]. LRT and patients played games of basketball, drew with chalk, and played corn hole while outside in the courtyard while getting fresh air and sunlight. Music was being played in the background. LRT and peers conversed about different games they have played before, what they do in their free time and anything else that is on their minds. LRT encouraged pts to drink water after being outside, sweating and getting their heart rate up.  Goal Area(s) Addressed: Patient will build on frustration tolerance skills. Patients will partake in a competitive play game with peers. Patients will gain knowledge of new leisure interest/hobby.    Affect/Mood: Appropriate   Participation Level: Active and Engaged   Participation Quality: Independent   Behavior: Appropriate, Calm, and Cooperative   Speech/Thought Process: Coherent   Insight: Fair   Judgement: Fair    Modes of Intervention: Activity, Exploration, and Guided Discussion   Patient Response to Interventions:  Attentive, Engaged, Interested , and Receptive   Education Outcome:  Acknowledges education   Clinical Observations/Individualized Feedback: [name] was active in their participation of session activities and group discussion. Pt chose to talk with LRT and MHT while outside. Pt interacted well with LRT and peers duration of session.    Plan: Continue to engage patient in RT group sessions 2-3x/week.   [address], LRT, CTRS 01/27/2024 1:27 PM

## 2024-01-27 NOTE — Plan of Care (Signed)
   Problem: Activity: Goal: Interest or engagement in activities will improve Outcome: Progressing   Problem: Health Behavior/Discharge Planning: Goal: Compliance with treatment plan for underlying cause of condition will improve Outcome: Progressing   Problem: Physical Regulation: Goal: Ability to maintain clinical measurements within normal limits will improve Outcome: Progressing   Problem: Safety: Goal: Periods of time without injury will increase Outcome: Progressing

## 2024-01-27 NOTE — Progress Notes (Signed)
   01/26/24 2000  Psych Admission Type (Psych Patients Only)  Admission Status Voluntary  Psychosocial Assessment  Patient Complaints None  Eye Contact Fair  Facial Expression Flat  Affect Appropriate to circumstance  Speech Logical/coherent  Interaction Assertive  Motor Activity Slow  Appearance/Hygiene Improved  Behavior Characteristics Cooperative;Appropriate to situation  Mood Preoccupied  Aggressive Behavior  Effect No apparent injury  Thought Process  Coherency WDL  Content WDL  Delusions None reported or observed  Perception WDL  Hallucination None reported or observed  Judgment WDL  Confusion None  Danger to Self  Current suicidal ideation? Denies  Agreement Not to Harm Self Yes  Description of Agreement verbal  Danger to Others  Danger to Others None reported or observed   Patient alert and oriented x 3 with periods of confusion to situation, he appears restless, and responding to internal stimuli.The patient's speech is pressured and incoherent at times he was offered emotional support and redirected as needed. 15 minutes safety checks maintained.

## 2024-01-27 NOTE — Group Note (Signed)
 Date:  01/27/2024 Time:  10:07 PM  Group Topic/Focus:  Wrap-Up Group:   The focus of this group is to help patients review their daily goal of treatment and discuss progress on daily workbooks.    Participation Level:  Did Not Attend   Katina Dung 01/27/2024, 10:07 PM

## 2024-01-27 NOTE — Group Note (Signed)
 [facility] LCSW Group Therapy Note    Group Date: 01/27/2024 Start Time: 1315 End Time: 1415  Type of Therapy and Topic:  Group Therapy:  Overcoming Obstacles  Participation Level:  [facility] PARTICIPATION LEVEL: Active  Mood:  Description of Group:   In this group patients will be encouraged to explore what they see as obstacles to their own wellness and recovery. They will be guided to discuss their thoughts, feelings, and behaviors related to these obstacles. The group will process together ways to cope with barriers, with attention given to specific choices patients can make. Each patient will be challenged to identify changes they are motivated to make in order to overcome their obstacles. This group will be process-oriented, with patients participating in exploration of their own experiences as well as giving and receiving support and challenge from other group members.  Therapeutic Goals: 1. Patient will identify personal and current obstacles as they relate to admission. 2. Patient will identify barriers that currently interfere with their wellness or overcoming obstacles.  3. Patient will identify feelings, thought process and behaviors related to these barriers. 4. Patient will identify two changes they are willing to make to overcome these obstacles:    Summary of Patient Progress   Patient was present in group.  Patient discussed how journaling has been a coping skill for him.  He reports that "sex" was a Associate Professor.  When CSW attempted to provide psycho-education on sex not being an appropriate coping skill, patient became upset and wanted to argue.  Patient did not respond to redirection and sat quietly the remainder of group, though this Clinical research associate notes that pts lips moved as if talking to this Clinical research associate or responding to internal stimuli.     Therapeutic Modalities:   Cognitive Behavioral Therapy Solution Focused Therapy Motivational  Interviewing Relapse Prevention Therapy   Harden Mo, LCSW

## 2024-01-27 NOTE — Progress Notes (Signed)
   01/27/24 0910  Psych Admission Type (Psych Patients Only)  Admission Status Voluntary  Psychosocial Assessment  Patient Complaints None  Eye Contact Fair  Facial Expression Flat  Affect Appropriate to circumstance  Speech Logical/coherent  Interaction Assertive  Motor Activity Slow  Appearance/Hygiene Improved  Behavior Characteristics Cooperative;Appropriate to situation  Mood Preoccupied  Aggressive Behavior  Effect No apparent injury  Thought Process  Coherency WDL  Content WDL  Delusions None reported or observed  Perception WDL  Hallucination None reported or observed  Judgment WDL  Confusion None  Danger to Self  Current suicidal ideation? Denies  Agreement Not to Harm Self Yes  Description of Agreement verbal  Danger to Others  Danger to Others None reported or observed

## 2024-01-27 NOTE — Plan of Care (Signed)
  Problem: Education: Goal: Knowledge of [facility] Education information/materials will improve Outcome: Progressing   Problem: Education: Goal: Emotional status will improve Outcome: Progressing   Problem: Education: Goal: Mental status will improve Outcome: Progressing   Problem: Education: Goal: Verbalization of understanding the information provided will improve Outcome: Progressing   Problem: Activity: Goal: Interest or engagement in activities will improve Outcome: Progressing   

## 2024-01-27 NOTE — Group Note (Signed)
 Date:  01/27/2024 Time:  4:59 PM  Group Topic/Focus:  Activity Group:  The focus of the group is to promote activity for the patients and encourage them to go outside in the courtyard for some fresh air and some exercise.   Participation Level:  Active  Participation Quality:  Appropriate  Affect:  Appropriate  Cognitive:  Appropriate  Insight: Appropriate  Engagement in Group:  Engaged  Modes of Intervention:  Activity   Albert [name] [name] 01/27/2024, 4:59 PM

## 2024-01-27 NOTE — Progress Notes (Signed)
   01/27/24 2300  Psych Admission Type (Psych Patients Only)  Admission Status Voluntary  Psychosocial Assessment  Patient Complaints None  Eye Contact Fair  Facial Expression Flat  Affect Appropriate to circumstance  Speech Logical/coherent  Interaction Assertive  Motor Activity Slow  Appearance/Hygiene Improved  Behavior Characteristics Cooperative;Appropriate to situation  Mood Preoccupied  Thought Process  Coherency WDL  Content WDL  Delusions None reported or observed  Perception WDL  Hallucination None reported or observed  Judgment WDL  Confusion None  Danger to Self  Current suicidal ideation? Denies  Agreement Not to Harm Self Yes  Description of Agreement Verbal  Danger to Others  Danger to Others None reported or observed

## 2024-01-27 NOTE — Progress Notes (Signed)
 Patient ID: Albert [name], male   DOB: 1976-08-08, 48 y.o.   MRN: [id number]  Per chart review:   History of Present Illness: 48 year old Caucasian male with a psychiatric history significant for schizoaffective disorder, bipolar type, paranoid schizophrenia, and alcohol dependence, who presented to the emergency department on 01/18/2024 at 2:03 AM due to inability to sleep for at least two days, along with agitation, psychosis, and manic symptoms.The patient is currently noncompliant with his outpatient medications (Depakote and Seroquel), which is supported by both his self-report and a low valproic acid level. He presents with disorganized thinking, tangential speech, and delusional content. He endorses belief in a computer AI program called "MOM," which he claims gives him sexual sensations and communicates with him in public places, such as [place]. He references a sponsor named "Dene Gentry," whom he believes is associated with this program; however, collateral from the patient's mother confirms that Dene Gentry is not a real individual and that these beliefs are consistent with his prior psychotic episodes.The patient denies suicidal or homicidal ideation but exhibits poor insight into his condition, is preoccupied with delusions, and has been repeatedly requesting Seroquel, despite being informed of QTc prolongation (QTc = ) and associated risks. He became verbally aggressive when informed that Seroquel would not be initially restarted, but later agreed to accept the cardiac risks after discussing with the psychiatrist.The patient also reported attending meetings for sex addiction but denied a formal diagnosis. He acknowledged masturbating frequently and became irritable when questioned about this behavior. He remains alert and oriented x4, but displays manic energy, distractibility, and pressured speech. His mother voiced concern regarding lack of outpatient mental health support and  requests to be kept informed of his status.Based on the current presentation, the patient meets criteria for inpatient psychiatric admission for acute stabilization of psychosis and mania, and treatment of noncompliance and impaired insight. Associated Signs/Symptoms: insomnia, agitations, impaired memory, loss of energy, fatigue.   Face-to face evaluation by this provider. Patient is pleasant and cooperative upon approach.  He is alert and oriented x 4. He is appropriately dressed and groomed. His thought process is coherent and organized. Patient denies SI/HI/AVH and there is no indication that he could be preoccupied or responding to internal stimuli. Patient admits that he was experiencing stressful situations but denies current symptoms. Patient has good eye contact and has remained active in the milieu. He has no sign of distress.   Patient expresses readiness for discharged. Dr Irwin Brakeman is consulted and recommends reassessment in AM for possible discharge.

## 2024-01-28 DIAGNOSIS — F250 Schizoaffective disorder, bipolar type: Secondary | ICD-10-CM | POA: Diagnosis not present

## 2024-01-28 MED ORDER — DIVALPROEX SODIUM 250 MG PO DR TAB: 750.0000 mg | DELAYED_RELEASE_TABLET | Freq: Two times a day (BID) | ORAL | 0 refills | Status: AC

## 2024-01-28 MED ORDER — FLUPHENAZINE HCL 2.5 MG PO TABS: 7.5000 mg | ORAL_TABLET | Freq: Two times a day (BID) | ORAL | 0 refills | Status: DC

## 2024-01-28 MED ORDER — QUETIAPINE FUMARATE 100 MG PO TABS: 100.0000 mg | ORAL_TABLET | Freq: Every day | ORAL | 0 refills | Status: DC

## 2024-01-28 NOTE — Group Note (Signed)
 Date:  01/28/2024 Time:  10:07 AM  Group Topic/Focus:  Goals Group:   The focus of this group is to help patients establish daily goals to achieve during treatment and discuss how the patient can incorporate goal setting into their daily lives to aide in recovery.    Participation Level:  Active  Participation Quality:  Appropriate  Affect:  Appropriate  Cognitive:  Appropriate  Insight: Appropriate  Engagement in Group:  Engaged  Modes of Intervention:  Activity  Additional Comments:    Albert [name] Albert [name] 01/28/2024, 10:07 AM

## 2024-01-28 NOTE — Progress Notes (Signed)
 Discharge Note:  Patient denies SI/HI/AVH at this time. Discharge instructions, AVS, prescriptions, and transition record gone over with patient. Patient agrees to comply with medication management, follow-up visit, and outpatient therapy. Patient belongings returned to patient. Patient questions and concerns addressed and answered. Patient ambulatory off unit @ 1245. Patient discharged to home with self care and transported by father.

## 2024-01-28 NOTE — Group Note (Signed)
 Recreation Therapy Group Note   Group Topic:Self-Esteem  Group Date: 01/28/2024 Start Time: 1000 End Time: 1045 Facilitators: Rosina Lowenstein, LRT, CTRS Location:  Craft Room  Group Description: Positive Affirmation Worksheet. Patients and LRT discussed the importance of self-love/self-esteem and things that cause it to fluctuate, including our mental health. Pts then completed a worksheet that helps them identify 24 different strengths and qualities about themselves. Pt encouraged to read aloud at least 3 off their sheet to the group. Afterwards, LRT and pts play positive affirmation bingo. LRT and pts discussed how this can be applied to daily life post-discharge.   Goal Area(s) Addressed: Patient will identify positive qualities about themselves. Patient will learn new positive affirmations.  Patient will recite positive qualities and affirmations aloud to the group.  Patient will practice positive self-talk.  Patient will increase communication.   Affect/Mood: Appropriate   Participation Level: Active and Engaged   Participation Quality: Independent   Behavior: Calm and Cooperative   Speech/Thought Process: Coherent   Insight: Good   Judgement: Good   Modes of Intervention: Exploration, Guided Discussion, Socialization, Support, Worksheet, and Writing   Patient Response to Interventions:  Attentive, Engaged, Interested , and Receptive   Education Outcome:  Acknowledges education   Clinical Observations/Individualized Feedback: [name] was active in their participation of session activities and group discussion. Pt identified "What I really enjoy the most is finding relaxing study time. My favorite place is [employer] after church on Saturday nights. I have been told that I have pretty eyes by my boss". Pt interacted well with LRT and peers duration of session.    Plan: Continue to engage patient in RT group sessions 2-3x/week.   Rosina Lowenstein, LRT, CTRS 01/28/2024 1:17  PM

## 2024-01-28 NOTE — [facility] Suicide Risk Assessment (Signed)
 [facility] Discharge Suicide Risk Assessment   Principal Problem: Schizoaffective disorder, bipolar type [facility]) Discharge Diagnoses: Principal Problem:   Schizoaffective disorder, bipolar type (HCC) Active Problems:   Medical non-compliance   Tachycardia   Total Time spent with patient: 30 minutes  Musculoskeletal: Strength & Muscle Tone: within normal limits Gait & Station: normal Patient leans: N/A  Psychiatric Specialty Exam  Presentation  General Appearance:  Appropriate for Environment  Eye Contact: Fair  Speech: Clear and Coherent  Speech Volume: Normal  Handedness: Left   Mood and Affect  Mood: Euthymic  Duration of Depression Symptoms: Greater than two weeks  Affect: Congruent   Thought Process  Thought Processes: Coherent  Descriptions of Associations:Intact  Orientation:Full (Time, Place and Person)  Thought Content:Logical  History of Schizophrenia/Schizoaffective disorder:Yes  Duration of Psychotic Symptoms:Greater than six months  Hallucinations:Hallucinations: None  Ideas of Reference:None  Suicidal Thoughts:Suicidal Thoughts: No  Homicidal Thoughts:Homicidal Thoughts: No   Sensorium  Memory: Immediate Good; Recent Fair; Remote Fair  Judgment: Intact  Insight: Fair   Chartered certified accountant: Fair  Attention Span: Fair  Recall: [employer] of Knowledge: Fair  Language: Fair   Psychomotor Activity  Psychomotor Activity: Psychomotor Activity: Normal   Assets  Assets: Communication Skills; Desire for Improvement; Physical Health   Sleep  Sleep: Sleep: Fair Number of Hours of Sleep: 7   Physical Exam: Physical Exam ROS Blood pressure 104/71, pulse 64, temperature 98.1 F (36.7 C), resp. rate 18, height 5\' 10"  (1.778 m), weight 100 kg, SpO2 99%. Body mass index is 31.63 kg/m.  Mental Status Per Nursing Assessment::   On Admission:  NA  Demographic Factors:  Male and Caucasian  Loss  Factors: Financial problems/change in socioeconomic status  Historical Factors: Impulsivity  Risk Reduction Factors:   Sense of responsibility to family, Religious beliefs about death, Living with another person, especially a relative, Positive social support, Positive therapeutic relationship, and Positive coping skills or problem solving skills  Continued Clinical Symptoms:  Previous Psychiatric Diagnoses and Treatments  Cognitive Features That Contribute To Risk:  None    Suicide Risk:  Minimal: No identifiable suicidal ideation.  Patients presenting with no risk factors but with morbid ruminations; may be classified as minimal risk based on the severity of the depressive symptoms   Follow-up Information     [facility], [facility]. Go to.   Specialty: Behavioral Health Why: In person appointment 04/09 at 12:20 PM. Contact information: [address] [place] [address] [phone]                 Plan Of Care/Follow-up recommendations:  Activity:  As tolerated  Verner Chol, MD 01/28/2024, 9:49 AM

## 2024-01-28 NOTE — Progress Notes (Signed)
   01/28/24 0539  15 Minute Checks  Location Bedroom  Visual Appearance Calm  Behavior Sleeping  Sleep (Behavioral Health Patients Only)  Calculate sleep? (Click Yes once per 24 hr at 0600 safety check) Yes  Documented sleep last 24 hours 12

## 2024-01-28 NOTE — Group Note (Signed)
 Date:  01/28/2024 Time:  10:12 AM  Group Topic/Focus:  Wellness Toolbox:   The focus of this group is to discuss various aspects of wellness, balancing those aspects and exploring ways to increase the ability to experience wellness.  Patients will create a wellness toolbox for use upon discharge.    Participation Level:  Active  Participation Quality:  Appropriate  Affect:  Appropriate  Cognitive:  Appropriate  Insight: Appropriate  Engagement in Group:  Engaged  Modes of Intervention:  Activity  Additional Comments:    Lynelle Smoke Babacar Haycraft 01/28/2024, 10:12 AM

## 2024-01-28 NOTE — Care Management Important Message (Signed)
 Important Message  Patient Details  Name: Albert [name] MRN: [id number] Date of Birth: [birth year]   Important Message Given:  Yes - Medicare IM     Harden Mo, LCSW 01/28/2024, 9:44 AM

## 2024-01-28 NOTE — Progress Notes (Signed)
  [facility] Adult Case Management Discharge Plan :  Will you be returning to the same living situation after discharge:  Yes,  pt reports that he is returning home.  At discharge, do you have transportation home?: Yes,  pt reports that his step-dad Hal will provide transportation Do you have the ability to pay for your medications: Yes,  [employer] MEDICARE / Suan Halter DUAL COMPLETE  Release of information consent forms completed and in the chart;  Patient's signature needed at discharge.  Patient to Follow up at:  Follow-up Information     Center, [facility]. Go to.   Specialty: Behavioral Health Why: In person appointment 04/09 at 12:20 PM. Contact information: [address] [place] [address] [phone]                 Next level of care provider has access to [facility] Link:no  Safety Planning and Suicide Prevention discussed: Yes,  SPE completed with the patient.  Patient declined collateral contact.     Has patient been referred to the Quitline?: Patient refused referral for treatment  Patient has been referred for addiction treatment: Patient refused referral for treatment.  Harden Mo, LCSW 01/28/2024, 9:41 AM

## 2024-01-28 NOTE — Discharge Summary (Signed)
  History of Present Illness:   Per admission note:   Albert [name] is 48 year old Caucasian male with a psychiatric history significant for schizoaffective disorder, bipolar type, paranoid schizophrenia, and alcohol dependence, who presented to the emergency department on 01/18/2024 at 2:03 AM due to inability to sleep for at least two days, along with agitation, psychosis, and manic symptoms.The patient is currently noncompliant with his outpatient medications (Depakote and Seroquel), which is supported by both his self-report and a low valproic acid level. He presents with disorganized thinking, tangential speech, and delusional content. He endorses belief in a computer AI program called "MOM," which he claims gives him sexual sensations and communicates with him in public places, such as [place]. He references a sponsor named "Dene Gentry," whom he believes is associated with this program; however, collateral from the patient's mother confirms that Dene Gentry is not a real individual and that these beliefs are consistent with his prior psychotic episodes.The patient denies suicidal or homicidal ideation but exhibits poor insight into his condition, is preoccupied with delusions, and has been repeatedly requesting Seroquel, despite being informed of QTc prolongation (QTc = ) and associated risks. He became verbally aggressive when informed that Seroquel would not be initially restarted, but later agreed to accept the cardiac risks after discussing with the psychiatrist.The patient also reported attending meetings for sex addiction but denied a formal diagnosis. He acknowledged masturbating frequently and became irritable when questioned about this behavior. He remains alert and oriented x4, but displays manic energy, distractibility, and pressured speech. His mother voiced concern regarding lack of outpatient mental health support and requests to be kept informed of his status.Based on the  current presentation, the patient meets criteria for inpatient psychiatric admission for acute stabilization of psychosis and mania, and treatment of noncompliance and impaired insight. Associated Signs/Symptoms: insomnia, agitations, impaired memory, loss of energy, fatigue.  Today upon assessment:  Patient is evaluated face-to-face by this provider and chart/nursing notes reviewed. Patient is seen in his room where he is exercising. He is pleasant upon approach. He is appropriately dressed and groomed. Alert and oriented x 4. He appears healthy and well-nourished. He has good eye contact and remains focused on the topic. Patient does not seem to be preoccupied, or responding to internal stimuli. Patient denies SI/HI/AVH. His speech is clear, coherent and goal directed. He has a pleasant demeanor and denies feeling sad, depressed or anxious. Patient expressed readiness for discharge stating "look at me, do I look like I was when I came in ?". He reports feeling improvement in mood and his thought process is more organized.  Patient reports that his medications are working well and he is willing to follow up with Dr Betti Cruz at [facility] in [place]. Patient reports that his family is supportive and his step-dad is picking him up to take him home.    During his stay at [facility], patient remained active in the milieu and participated in his treatment team/plan. Patient's behaviors were observed by team and he has maintained a positive attitude toward staff and peers. Patient maintained medication adherence and there was no major safety concerns.   Patient expresses readiness for discharge and has an active aftercare plan. He reports having good support system  and there are no safety concerns at home. Pr Dr Conley Rolls recommendations, patient will be discharged today to current settings and will continue treatment in outpatient services.

## 2024-02-06 NOTE — Discharge Summary (Signed)
 Physician Discharge Summary Note  Patient:  Albert [name] is an 48 y.o., male MRN:  [id number] DOB:  10/09/1976 Patient phone:  [phone] (home)  Patient address:   [address] [place] [address],    Date of Admission:  01/18/2024 Date of Discharge: 01/28/24  Reason for Admission:  48 year old Caucasian male with a psychiatric history significant for schizoaffective disorder, bipolar type, paranoid schizophrenia, and alcohol dependence, who presented to the emergency department on 01/18/2024 at 2:03 AM due to inability to sleep for at least two days, along with agitation, psychosis, and manic symptoms.The patient is currently noncompliant with his outpatient medications (Depakote and Seroquel), which is supported by both his self-report and a low valproic acid level. He presents with disorganized thinking, tangential speech, and delusional content.   Principal Problem: Schizoaffective disorder, bipolar type [facility]) Discharge Diagnoses: Principal Problem:   Schizoaffective disorder, bipolar type (HCC) Active Problems:   Medical non-compliance   Tachycardia   Past Psychiatric History: see h&p  Family Psychiatric  History: see h&p Social History:  Social History   Substance and Sexual Activity  Alcohol Use Not Currently   Comment: has been sober since June 4th     Social History   Substance and Sexual Activity  Drug Use Not Currently   Types: Marijuana, Benzodiazepines   Comment: THC: was smoking 1 ounce every day, last use: April & xanax use was 10 mg every day for "a few months"    Social History   Socioeconomic History   Marital status: Single    Spouse name: Not on file   Number of children: Not on file   Years of education: Not on file   Highest education level: Not on file  Occupational History    Comment: Unclear  Tobacco Use   Smoking status: Former    Current packs/day: 0.00    Types: Cigarettes    Quit date: 02/21/2022    Years since  quitting: 1.9   Smokeless tobacco: Never   Tobacco comments:    He stopped smoking in April of 2023. He said that he used to smoke "a few packs per day."   Vaping Use   Vaping status: Never Used  Substance and Sexual Activity   Alcohol use: Not Currently    Comment: has been sober since June 4th   Drug use: Not Currently    Types: Marijuana, Benzodiazepines    Comment: THC: was smoking 1 ounce every day, last use: April & xanax use was 10 mg every day for "a few months"   Sexual activity: Not Currently  Other Topics Concern   Not on file  Social History Narrative   Pt reported that he lives in a house apartment.   Social Drivers of Corporate investment banker Strain: Low Risk  (05/26/2023)   Received from [facility]   Overall Financial Resource Strain (CARDIA)    Difficulty of Paying Living Expenses: Not hard at all  Food Insecurity: No Food Insecurity (01/19/2024)   Hunger Vital Sign    Worried About Running Out of Food in the Last Year: Never true    Ran Out of Food in the Last Year: Never true  Transportation Needs: No Transportation Needs (01/19/2024)   PRAPARE - Administrator, Civil Service (Medical): No    Lack of Transportation (Non-Medical): No  Physical Activity: Inactive (05/26/2023)   Received from [facility]   Exercise Vital Sign    Days of Exercise per Week:  0 days    Minutes of Exercise per Session: 0 min  Stress: Stress Concern Present (05/26/2023)   Received from [facility] of Occupational Health - Occupational Stress Questionnaire    Feeling of Stress : Rather much  Social Connections: Patient Unable To Answer (01/19/2024)   Social Connection and Isolation Panel [NHANES]    Frequency of Communication with Friends and Family: Patient unable to answer    Frequency of Social Gatherings with Friends and Family: Patient unable to answer    Attends Religious Services: Patient unable to answer    Active Member of Clubs  or Organizations: Patient unable to answer    Attends Banker Meetings: Patient unable to answer    Marital Status: Patient unable to answer   Past Medical History:  Past Medical History:  Diagnosis Date   Alcoholism (HCC)    Bipolar disorder (HCC)    Drug abuse (HCC)    xanax addiction 5 years ago   Heartburn    Medical non-compliance    Paranoid schizophrenia (HCC)    Schizophrenia (HCC)    Seizures (HCC)     Past Surgical History:  Procedure Laterality Date   NO PAST SURGERIES     Family History:  Family History  Problem Relation Age of Onset   Thyroid disease Mother    Depression Maternal Aunt     Hospital Course:  57 year old Caucasian male with a psychiatric history significant for schizoaffective disorder, bipolar type, paranoid schizophrenia, and alcohol dependence, who presented to the emergency department on 01/18/2024 at 2:03 AM due to inability to sleep for at least two days, along with agitation, psychosis, and manic symptoms.The patient is currently noncompliant with his outpatient medications (Depakote and Seroquel), which is supported by both his self-report and a low valproic acid level. He presents with disorganized thinking, tangential speech, and delusional content. Patient is admitted to psych unit with Q15 min safety monitoring. Multidisciplinary team approach is offered. Medication management; group/milieu therapy is offered.  On admission patient was started on Klonopin 0.5 mg twice daily for 3 days followed by 0.25 mg twice daily for 2 days for benzodiazepine withdrawal.  Patient was started on Seroquel 200 mg nightly, Depakote 1000 mg twice daily for mood stabilization.  Patient tolerated medications very well with no reported side effects.  Throughout his hospitalization he denies suicidal/homicidal ideation/intent/plan.  Initially he was responding to internal stimuli but with medication adjustments his psychosis improved.  His delusions started  resolving and is able to have a linear conversation.  He allowed the team to reach out to his outpatient provider.  His medications were titrated to Depakote ER 750 mg twice daily, Prolixin 7.5 mg twice daily and Seroquel was reduced to 100 mg nightly.  Patient started requesting discharge and on assessment he was not spotting to internal stimuli agreeing with the treatment, taking his medications, participating in his ADLs, consistently denying SI/HI/intent/plan.  He does not meet IVC criteria anymore.  Patient is willing to participate in outpatient mental health services.  On the day of discharge patient denies SI/HI/intent/plan and denies auditory/visual hallucinations.  His outpatient provider was informed of the discharge.  No access to guns in the house.  Patient was discharged home     Psychiatric Specialty Exam:  Presentation  General Appearance:  Appropriate for Environment; Casual  Eye Contact: Fair  Speech: Clear and Coherent  Speech Volume: Normal    Mood and Affect  Mood: Euthymic  Affect: Appropriate  Thought Process  Thought Processes: Coherent  Descriptions of Associations:Intact  Orientation:Full (Time, Place and Person)  Thought Content:Logical  Hallucinations:Hallucinations: None  Ideas of Reference:None  Suicidal Thoughts:Suicidal Thoughts: No  Homicidal Thoughts:Homicidal Thoughts: No   Sensorium  Memory: Immediate Fair; Recent Fair; Remote Fair  Judgment: Fair  Insight: Fair   Art therapist  Concentration: Fair  Attention Span: Fair  Recall: [employer] of Knowledge: Fair  Language: Fair   Psychomotor Activity  Psychomotor Activity: Psychomotor Activity: Normal  Musculoskeletal: Strength & Muscle Tone: within normal limits Gait & Station: normal Assets  Assets: Manufacturing systems engineer; Desire for Improvement; Financial Resources/Insurance; Talents/Skills   Sleep  Sleep: Sleep: Fair    Physical  Exam: Physical Exam Vitals and nursing note reviewed.  HENT:     Head: Normocephalic.     Right Ear: Tympanic membrane normal.     Nose: Nose normal.     Mouth/Throat:     Mouth: Mucous membranes are moist.  Cardiovascular:     Rate and Rhythm: Normal rate.  Pulmonary:     Effort: Pulmonary effort is normal.  Abdominal:     General: Bowel sounds are normal.  Skin:    General: Skin is warm.  Neurological:     General: No focal deficit present.     Mental Status: He is alert.    Review of Systems  Constitutional: Negative.   HENT: Negative.    Eyes: Negative.   Respiratory: Negative.    Cardiovascular: Negative.   Skin: Negative.   Neurological: Negative.    Blood pressure 104/71, pulse 64, temperature 98.1 F (36.7 C), resp. rate 18, height 5\' 10"  (1.778 m), weight 100 kg, SpO2 99%. Body mass index is 31.63 kg/m.   Social History   Tobacco Use  Smoking Status Former   Current packs/day: 0.00   Types: Cigarettes   Quit date: 02/21/2022   Years since quitting: 1.9  Smokeless Tobacco Never  Tobacco Comments   He stopped smoking in April of 2023. He said that he used to smoke "a few packs per day."    Tobacco Cessation:  N/A, patient does not currently use tobacco products   Blood Alcohol level:  Lab Results  Component Value Date   ETH <10 01/18/2024   ETH <10 10/28/2023    Metabolic Disorder Labs:  Lab Results  Component Value Date   HGBA1C 5.1 01/20/2024   MPG 99.67 01/20/2024   MPG 114 10/28/2023   Lab Results  Component Value Date   PROLACTIN 30.4 (H) 01/06/2017   PROLACTIN 13.1 01/05/2017   Lab Results  Component Value Date   CHOL 182 10/28/2023   TRIG 145 10/28/2023   HDL 38 (L) 10/28/2023   CHOLHDL 4.8 10/28/2023   VLDL 29 10/28/2023   LDLCALC 115 (H) 10/28/2023   LDLCALC 78 07/25/2022    See Psychiatric Specialty Exam and Suicide Risk Assessment completed by Attending Physician prior to discharge.  Discharge destination:  Home  Is  patient on multiple antipsychotic therapies at discharge:  No   Has Patient had three or more failed trials of antipsychotic monotherapy by history:  No  Recommended Plan for Multiple Antipsychotic Therapies: NA   Allergies as of 01/28/2024       Reactions   Chicken Allergy Other (See Comments)   Pt reports he doesn't eat chicken        Medication List     STOP taking these medications    ibuprofen 200 MG tablet Commonly known as: ADVIL  TAKE these medications      Indication  divalproex 250 MG DR tablet Commonly known as: DEPAKOTE Take 3 tablets (750 mg total) by mouth 2 (two) times daily. What changed:  medication strength how much to take  Indication: Schizophrenia   fluPHENAZine 2.5 MG tablet Commonly known as: PROLIXIN Take 3 tablets (7.5 mg total) by mouth 2 (two) times daily.  Indication: Psychosis   omeprazole 40 MG capsule Commonly known as: PRILOSEC Take 40 mg by mouth daily as needed (for heartburn or reflux).  Indication: Stomach Ulcer   QUEtiapine 100 MG tablet Commonly known as: SEROQUEL Take 1 tablet (100 mg total) by mouth at bedtime. What changed:  medication strength how much to take  Indication: Depressive Phase of Manic-Depression        Follow-up Information     [facility]. Go to.   Specialty: Behavioral Health Why: In person appointment 04/09 at 12:20 PM. Contact information: [address] [place] [address] [phone]                 Follow-up recommendations:  Activity:  As tolerated    Signed: Verner Chol, MD 02/06/2024, 10:53 AM

## 2024-03-17 NOTE — ED Provider Notes (Signed)
 EM Observation Re-evaluation note  The patient has been placed in psychiatric observation due to the need to provide a safe environment for the patient while obtaining psychiatric consultation and evaluation, as well as ongoing medical and medication management to treat the patient's condition. Patient currently in observation status to determine need for inpatient admission versus discharge home with close outpatient follow-up.  Albert [name] is a 48 y.o. male, seen on rounds today.  The patient presented with a chief complaint of flight of ideas and paranoia.  Evaluate by psychiatry today.  Plan for admission.

## 2024-03-17 NOTE — ED Provider Notes (Signed)
 [facility] Emergency Department Provider Note  This document was created using the aid of voice recognition Dragon dictation software.   Provider at Bedside: 03/17/2024 2:20 AM  History   Chief Complaint  Patient presents with  . Psychsocial Concerns     History of Present Illness  History obtained from:Patient  Albert [name] is a 48 y.o. male who presents to the ED for mental health evaluation. Patient has a history of schizophrenia, bipolar disorder, drug abuse, alcoholism.  Patient reports that he is hearing voices.  He has flight of ideas and is very tangential. I am unable to redirect him. He is rocking back and forth and appears to be very agitated and anxious. Unable to obtain further information at this time.     Past Medical History History reviewed. No pertinent past medical history.  Past Surgical History History reviewed. No pertinent surgical history.  Current Medications Home Medications   No medications reported.     Allergies No Known Allergies  Family History No family history on file.  Social History Social History   Tobacco Use  . Smoking status: Never  . Smokeless tobacco: Never  Substance Use Topics  . Alcohol use: Never  . Drug use: Never     Physical Exam   BP 146/90 (BP Location: Right arm, Patient Position: Sitting)   Pulse 70   Temp 97.9 F (36.6 C) (Oral)   Resp 18   Ht 177.8 cm (5' 10)   Wt 101 kg (222 lb 9.6 oz)   BMI 31.94 kg/m   Physical Exam Vitals and nursing note reviewed.  Constitutional:      General: He is not in acute distress.    Appearance: He is well-developed. He is not toxic-appearing or diaphoretic.  HENT:     Head: Normocephalic and atraumatic.     Right Ear: External ear normal.     Left Ear: External ear normal.  Eyes:     General: Lids are normal.        Right eye: No discharge.        Left eye: No discharge.  Cardiovascular:     Rate and Rhythm: Normal rate and  regular rhythm.     Heart sounds: Normal heart sounds.  Pulmonary:     Effort: Pulmonary effort is normal.     Breath sounds: Normal breath sounds. No wheezing, rhonchi or rales.  Abdominal:     Palpations: Abdomen is soft.     Tenderness: There is no abdominal tenderness.  Musculoskeletal:     Cervical back: Normal range of motion and neck supple.  Skin:    General: Skin is warm.  Neurological:     General: No focal deficit present.     Mental Status: He is alert and oriented to person, place, and time.  Psychiatric:        Attention and Perception: He is inattentive.        Mood and Affect: Mood is anxious.        Speech: Speech is rapid and pressured and tangential.        Behavior: Behavior is agitated.        Thought Content: Thought content is paranoid.        Judgment: Judgment is impulsive and inappropriate.     Labs   Labs Reviewed  ETHANOL - Normal      Result Value   Ethanol <10    COMPREHENSIVE METABOLIC PANEL   Sodium 139  Potassium 4.0     Chloride 107     CO2 23     Anion Gap 9     Glucose, Random 88     Blood Urea Nitrogen (BUN) 14     Creatinine 0.85     eGFR >90     Albumin 4.3     Total Protein 7.4     Bilirubin, Total 0.5     Alkaline Phosphatase (ALP) 44     Aspartate Aminotransferase (AST) 14     Alanine Aminotransferase (ALT) 10     Calcium 8.9     BUN/Creatinine Ratio      CBC WITH DIFFERENTIAL   WBC 9.00     RBC 4.71     Hemoglobin 14.9     Hematocrit 43.0     Mean Corpuscular Volume (MCV) 91.4     Mean Corpuscular Hemoglobin (MCH) 31.5     Mean Corpuscular Hemoglobin Conc (MCHC) 34.5     Red Cell Distribution Width (RDW) 13.8     Platelet Count (PLT) 257     Mean Platelet Volume (MPV) 8.1     Neutrophils % 58     Lymphocytes % 33     Monocytes % 8     Eosinophils % 1     Basophils % 1     Neutrophils Absolute 5.20     Lymphocytes # 3.00     Monocytes # 0.70     Eosinophils # 0.10     Basophils # 0.10    FENTANYL  ANALOGUES AND METABOLITES, URINE LC-MS/MS  DRUG OF ABUSE 7 PANEL       ED Course   ED Course as of 03/17/24 0532  Tue Mar 17, 2024  0225 The patient has been placed in psychiatric observation due to the need to provide a safe environment for the patient while obtaining psychiatric consultation and evaluation, as well as ongoing medical and medication management to treat the patient's condition.  The patient has not been placed under full IVC at this time.     [MI]    ED Course User Index [MI] Hoy Gerome Space, [place]     Medical Decision Making   Additional Information: I reviewed the patient's past medical history, including notes from our facility and what is available in CareEverywhere.  External records were reviewed: I reviewed urgent care note from 03/10/2024 when patient was seen for schizoaffective disorder  Differentials considered: DDX: depression, suicidal attempt, anxiety, psychosis, delirium, drug/alcohol abuse/dependence, schizophrenia, adjustment reaction    Risk of complications and/or morbidity or mortality of patient management: Patient's mental illness increases the complexity of managing their presentation and will complicate their treatment plans. I have addressed this by have requested Ateam evaluation to help with possible inpatient detox or mental health evaluation.    Clinical Complexity:  Patient's presentation is most consistent with severe exacerbation of chronic illness.   Medical Decision Making Patient is a 48 year old male who presented to the ED for mental health evaluation.  Patient reports he is hearing voices.  He has flight of ideas on exam and is very agitated and anxious.  I am unable to obtain further information at this time.  He is rocking back and forth.  I have unable to redirect him.  Patient given Ativan  for agitation.  He does have a history of schizophrenia and bipolar disorder.  Will have assessment team/psychiatry evaluate  patient in the morning.  Labs are stable and he is medically cleared.  Patient  care will be reassigned to morning team at shift change.    Amount and/or Complexity of Data Reviewed Labs: ordered.  Risk Prescription drug management.     Diagnosis:  1. Schizoaffective disorder, bipolar type    (CMD)      Medication List    You have not been prescribed any medications.    FOLLOW UP No follow-up provider specified. __________________________________________________________   (Please note that portions of this note have been completed with Conservation officer, historic buildings.  Efforts were made to correct any errors, but occasionally words are mis-transcribed.)

## 2024-04-10 NOTE — ED Provider Notes (Signed)
 Emergency Department Provider Note   History   Chief Complaint  Patient presents with  . Mental Health Problem      History provided by:  Patient Mental Health Problem Presenting symptoms: bizarre behavior   Presenting symptoms: no suicidal thoughts   Degree of incapacity (severity):  Severe Onset quality:  Unable to specify Timing:  Constant Progression:  Waxing and waning Chronicity:  Recurrent Context: recent medication change   Treatment compliance:  Untreated Associated symptoms: anxiety   Risk factors: family hx of mental illness and recent psychiatric admission      Past Medical History Medical History[1]   Past Surgical History Surgical History[2]    Medications Current Outpatient Medications  Medication Instructions  . chlorproMAZINE (THORAZINE) 50 mg, oral, 3 times daily with meals  . nicotine  polacrilex (NICORETTE ) 2 mg, Mouth/Throat, Every 1 hour prn  . OLANZapine  (ZYPREXA ) 20 mg, oral, At bedtime      Allergies Allergies[3]   Family History Family History[4]   Social History Social History   Socioeconomic History  . Marital status: Single    Spouse name: Not on file  . Number of children: Not on file  . Years of education: Not on file  . Highest education level: Not on file  Occupational History  . Not on file  Tobacco Use  . Smoking status: Never  . Smokeless tobacco: Never  Substance and Sexual Activity  . Alcohol use: Never  . Drug use: Never  . Sexual activity: Not on file  Other Topics Concern  . Not on file  Social History Narrative  . Not on file   Social Drivers of Health   Food Insecurity: No Food Insecurity (01/19/2024)   Received from [facility]   Food vital sign   . Within the past 12 months, you worried that your food would run out before you got money to buy more: Never true   . Within the past 12 months, the food you bought just didn't last and you didn't have money to get more: Never true  Transportation  Needs: No Transportation Needs (01/19/2024)   Received from [facility] - Transportation   . Lack of Transportation (Medical): No   . Lack of Transportation (Non-Medical): No  Safety: Patient Declined (01/19/2024)   Received from [facility]   Safety   . Within the last year, have you been afraid of your partner or ex-partner?: Patient declined   . Within the last year, have you been humiliated or emotionally abused in other ways by your partner or ex-partner?: Patient declined   . Within the last year, have you been kicked, hit, slapped, or otherwise physically hurt by your partner or ex-partner?: Patient declined   . Within the last year, have you been raped or forced to have any kind of sexual activity by your partner or ex-partner?: Patient declined  Living Situation: Low Risk  (01/19/2024)   Received from [facility] Situation   . In the last 12 months, was there a time when you were not able to pay the mortgage or rent on time?: No   . In the past 12 months, how many times have you moved where you were living?: 0   . At any time in the past 12 months, were you homeless or living in a shelter (including now)?: No      Review of Systems  Review of Systems  Psychiatric/Behavioral:  Negative for suicidal ideas. The patient is nervous/anxious.  Physical Exam   ED Triage Vitals [04/10/24 0141]  Temp 98.1 F (36.7 C)  Heart Rate 69  Resp 18  BP 139/90  MAP (mmHg) 107  SpO2 97 %  O2 Device None (Room air)  O2 Flow Rate (L/min)   Weight 103 kg (227 lb 12.8 oz)    Physical Exam Constitutional:      General: He is not in acute distress.    Appearance: He is well-developed.  HENT:     Head: Atraumatic.     Mouth/Throat:     Mouth: Mucous membranes are moist.   Eyes:     Extraocular Movements: Extraocular movements intact.    Cardiovascular:     Rate and Rhythm: Normal rate and regular rhythm.  Pulmonary:     Effort: Pulmonary effort is normal.  No respiratory distress.  Abdominal:     General: Abdomen is flat. There is no distension.   Musculoskeletal:        General: No deformity.     Cervical back: Normal range of motion.   Skin:    General: Skin is warm and dry.   Neurological:     Mental Status: He is alert and oriented to person, place, and time. Mental status is at baseline.   Psychiatric:     Comments: Very anxious but pleasant     Labs  CBC, CMP, ethanol, drug screen normal  Radiology  None  Procedure Note  Procedures  Medical Decision Making   Medical Decision Making Patient is a 48 year old male with history as above notable for schizophrenia.  He presents today for evaluation of anxiety and problem with his medications.  He was recently admitted where his medications were changed.  He states this is not working for him so he went back to the old medication regimen.  He has since stopped taking his medication.  He is experiencing increased anxiety, poor sleep, and emotional lability.  He denies any SI or HI or hallucinations.  On exam, he is very anxious appearing.  Labs obtained for medical screening.  I have reviewed his labs.  He is medically clear.  Behavioral health consulted for evaluation.  Problems Addressed: Psychosis, unspecified psychosis type    (CMD): complicated acute illness or injury  Amount and/or Complexity of Data Reviewed Labs: ordered. ECG/medicine tests: ordered.  Risk Prescription drug management.    The patient has been placed in psychiatric observation due to the need to provide a safe environment for the patient while obtaining psychiatric consultation and evaluation, as well as ongoing medical and medication management to treat the patient's condition.  The patient is currently in observation status in order to determine necessity of psychiatric inpatient admission. The patient has not been placed under full IVC at this time.    Behavioral health evaluation is pending at  this time.  We do not have a counselor available overnight.  He is currently here voluntarily.  He will be held in the ED under psychiatric observation.  He will be signed out to the morning team, Dr.Vaughn, at 6 AM pending psychiatric evaluation and disposition.  ED Clinical Impression   1. Psychosis, unspecified psychosis type    (CMD)      ED Disposition     None         New Prescriptions   No medications on file      FOLLOW UP No follow-up provider specified.       [1] Past Medical History: Diagnosis Date  . Anxiety   .  Autistic behavior   . Hallucinations   . Paranoid schizophrenia    (CMD) 06/05/2018  . Psychosis    (CMD)   . Schizoaffective disorder, bipolar type    (CMD) 01/04/2017  [2] History reviewed. No pertinent surgical history. [3] No Known Allergies [4] Family History Problem Relation Name Age of Onset  . Thyroid  disease Mother    . Depression Maternal Aunt

## 2024-04-10 NOTE — ED Provider Notes (Signed)
 EM Observation Re-evaluation note  Albert [name] is a 48 y.o. male, seen on rounds today.  The patient presented with a chief complaint of bizarre behavior.  Currently, the patient is awaiting psychiatry evaluation.    Psychiatric examination: tangential, calm  MDM:  I have reviewed the labs performed to date as well as medications administered while in observation. Recent changes in the last 24 hours include: complaining of nasal congestion.  Current plan is for covid/flu swab, admit to psych for eval/tx of psychosis, flonase for congestion.  Patient is not under full IVC at this time.   The patient is currently in observation status in order to determine necessity of psychiatric inpatient admission.  Patient's presentation is most consistent with exacerbation of chronic illness, due to schizophrenia.

## 2024-04-11 NOTE — ED Provider Notes (Addendum)
 EM Observation Re-evaluation note  Albert [name] is a 48 y.o. male, seen on rounds today.  The patient presented with a chief complaint of mental health problem. Currently, the patient is resting comfortably.     Temp:  [97.3 F (36.3 C)-98 F (36.7 C)] 98 F (36.7 C) Heart Rate:  [64-78] 78 Resp:  [16] 16 BP: (115-141)/(84-85) 141/84 SpO2:  [97 %-98 %] 97 %  clear to auscultation bilaterally, non labored breathing regular rate and rhythm extremities normal, atraumatic, no cyanosis or edema Psychiatric examination: calm, resting comfortably  MDM:  I have reviewed the labs performed to date as well as medications administered while in observation. Recent changes in the last 24 hours include none.  Current plan is for Patient to remain in observation status pending admission to inpatient psychiatric service and bed availability.   Patient is not under full IVC at this time. He is medically cleared.    This document serves as a record of services personally performed by Paulina Easterly, MD. It was created on their behalf by Deatrice VEAR Bathe, Scribe, a trained medical scribe. The creation of this record is the provider's dictation and/or activities during the visit.   Electronically signed by: Deatrice VEAR Bathe, Scribe 04/11/2024 6:09 AM  I agree the documentation is accurate and complete. Reviewed by: Paulina Easterly, MD 04/11/2024 6:09 AM

## 2024-04-18 ENCOUNTER — Emergency Department (HOSPITAL_COMMUNITY)
Admission: EM | Admit: 2024-04-18 | Discharge: 2024-04-18 | Discharge status: Against Medical Advice | Attending: Emergency Medicine | Admitting: Emergency Medicine

## 2024-04-18 DIAGNOSIS — Z5321 Procedure and treatment not carried out due to patient leaving prior to being seen by health care provider: Secondary | ICD-10-CM | POA: Diagnosis present

## 2024-04-18 NOTE — ED Notes (Signed)
Patient left the ER.  

## 2024-05-01 NOTE — ED Provider Notes (Signed)
 Emergency Department Provider Note   History  No chief complaint on file.     History provided by:  Patient Mental Health Problem Presenting symptoms: bizarre behavior and hallucinations   Presenting symptoms: no suicidal thoughts   Degree of incapacity (severity):  Moderate Onset quality:  Unable to specify Timing:  Intermittent Progression:  Waxing and waning Chronicity:  Chronic Risk factors: family hx of mental illness and recent psychiatric admission      Past Medical History Medical History[1]   Past Surgical History Surgical History[2]    Medications Current Outpatient Medications  Medication Instructions  . divalproex  (DEPAKOTE  DR) 500 mg, oral, Every 12 hours scheduled  . QUEtiapine  (SEROQUEL ) 200 mg, oral, At bedtime      Allergies Allergies[3]   Family History Family History[4]   Social History Social History   Socioeconomic History  . Marital status: Single    Spouse name: Not on file  . Number of children: Not on file  . Years of education: Not on file  . Highest education level: Not on file  Occupational History  . Not on file  Tobacco Use  . Smoking status: Never  . Smokeless tobacco: Never  Substance and Sexual Activity  . Alcohol use: Never  . Drug use: Never  . Sexual activity: Not on file  Other Topics Concern  . Not on file  Social History Narrative  . Not on file   Social Drivers of Health   Food Insecurity: No Food Insecurity (01/19/2024)   Received from [facility]   Food vital sign   . Within the past 12 months, you worried that your food would run out before you got money to buy more: Never true   . Within the past 12 months, the food you bought just didn't last and you didn't have money to get more: Never true  Transportation Needs: No Transportation Needs (01/19/2024)   Received from [facility] - Transportation   . Lack of Transportation (Medical): No   . Lack of Transportation (Non-Medical): No   Safety: Patient Declined (01/19/2024)   Received from [facility]   Safety   . Within the last year, have you been afraid of your partner or ex-partner?: Patient declined   . Within the last year, have you been humiliated or emotionally abused in other ways by your partner or ex-partner?: Patient declined   . Within the last year, have you been kicked, hit, slapped, or otherwise physically hurt by your partner or ex-partner?: Patient declined   . Within the last year, have you been raped or forced to have any kind of sexual activity by your partner or ex-partner?: Patient declined  Living Situation: Low Risk  (01/19/2024)   Received from [facility] Situation   . In the last 12 months, was there a time when you were not able to pay the mortgage or rent on time?: No   . In the past 12 months, how many times have you moved where you were living?: 0   . At any time in the past 12 months, were you homeless or living in a shelter (including now)?: No      Review of Systems  Review of Systems  Psychiatric/Behavioral:  Positive for hallucinations. Negative for suicidal ideas.      Physical Exam   ED Triage Vitals [05/01/24 0135]  Temp 97.3 F (36.3 C)  Heart Rate 79  Resp 18  BP (!) 144/96  MAP (mmHg)  112  SpO2 98 %  O2 Device None (Room air)  O2 Flow Rate (L/min)   Weight 101 kg (223 lb)    Physical Exam Constitutional:      General: He is not in acute distress.    Appearance: He is well-developed.  HENT:     Head: Atraumatic.     Mouth/Throat:     Mouth: Mucous membranes are moist.   Eyes:     Extraocular Movements: Extraocular movements intact.    Cardiovascular:     Rate and Rhythm: Normal rate and regular rhythm.  Pulmonary:     Effort: Pulmonary effort is normal. No respiratory distress.  Abdominal:     General: Abdomen is flat. There is no distension.   Musculoskeletal:        General: No deformity.     Cervical back: Normal range of motion.    Skin:    General: Skin is warm and dry.   Neurological:     Mental Status: He is alert and oriented to person, place, and time. Mental status is at baseline.   Psychiatric:     Comments: Bizarre affect     Labs  None  Radiology  None  Procedure Note  Procedures  Medical Decision Making   Medical Decision Making Patient is a 48 year old male with history as above presents for evaluation of ongoing hallucinations.  This is a chronic problem for him.  He has had a couple of recent psychiatric admissions for same.  He denies any SI or HI at this time.  He is compliant with his medications.  He denies any alcohol or drug use.  On exam, he overall appears well.  He has some chronic hallucinations.  However, he does not appear to be in acute distress at this time.  I have seen the patient recently and reviewed his recent discharge summary.  The psychiatry team prefers to avoid inpatient hospitalization.  He does not appear to be at risk of harming himself or others at this time.  He missed his psychiatry appointment 2 days ago.  He has an ACT team.  I will have him reach out to his ACT team tomorrow.  He appears stable for discharge at this time.  No further inpatient workup or admission indicated at this time.  Amount and/or Complexity of Data Reviewed External Data Reviewed: notes.    Details: Reviewed discharge summary from 04/15/2024.     ED Clinical Impression   1. Hallucinations      ED Disposition     ED Disposition  Discharge   Condition  Stable   Comment  --           New Prescriptions   No medications on file      FOLLOW UP No follow-up provider specified.      [1] Past Medical History: Diagnosis Date  . Anxiety   . Autistic behavior   . Hallucinations   . Paranoid schizophrenia    (CMD) 06/05/2018  . Psychosis    (CMD)   . Schizoaffective disorder, bipolar type    (CMD) 01/04/2017  [2] No past surgical history on file. [3] No Known  Allergies [4] Family History Problem Relation Name Age of Onset  . Thyroid  disease Mother    . Depression Maternal Aunt

## 2024-05-07 NOTE — ED Provider Notes (Signed)
 EM Observation Re-evaluation note  Albert [name] is a 48 y.o. male, seen on rounds today.  The patient presented with a chief complaint of suicidal ideations, paranoia, hallucinations.      Temp:  [97.1 F (36.2 C)-97.6 F (36.4 C)] 97.1 F (36.2 C) Heart Rate:  [76-80] 80 Resp:  [17-18] 17 BP: (110-130)/(79-85) 110/79 SpO2:  [96 %-100 %] 100 %   Psychiatric examination: resting comfortably.   MDM:  I have reviewed the labs performed to date as well as medications administered while in observation. Recent changes in the last 24 hours include none.  Current plan is for awaiting evaluation by psychiatry team.  Patient is not under full IVC at this time.   Pt will be admitted by psychiatry.   This document serves as a record of services personally performed by Leita Phenix, MD. It was created on their behalf by Asberry Opal, Scribe, a trained medical scribe. The creation of this record is the provider's dictation and/or activities during the visit.   Electronically signed by: Asberry Opal, Scribe 05/07/2024 6:17 AM  I agree the documentation is accurate and complete. Reviewed by: Leita Phenix, MD 05/07/2024 6:17 AM  '

## 2024-05-07 NOTE — ED Provider Notes (Signed)
 [facility] Emergency Department Emergency Department Provider Note  This document was created using the aid of voice recognition Dragon dictation software.   Provider at bedside: 05/07/2024 2:44 AM  History obtained from the: Patient  History   Chief Complaint  Patient presents with  . Suicidal Ideation  . Paranoia  . Hallucinations    HPI  Albert [name] is a 48 y.o. male with a history of schizoaffective disorder who presents to the ED with complaints of auditory hallucinations.  Patient states he has noted worsening auditory hallucinations over the last few days.  Endorses associated SI with a plan to use a friend's gun.  Denies any HI.  Denies any substance or alcohol use.  Denies any other medical complaints or symptoms at this time.   2:44 AM Previous medical records reviewed from [facility] Everywhere and EPIC Chart Review.   No LMP for male patient.   ROS: Pertinent positives and negatives per HPI. Pertinent past medical, surgical, social and family history records were reviewed. Current Medications and Allergies were reviewed.  Physical Exam   Vitals:   05/07/24 0134  BP: 130/85  BP Location: Left arm  Patient Position: Sitting  Pulse: 76  Resp: 18  Temp: 97.6 F (36.4 C)  TempSrc: Oral  SpO2: 96%  Weight: 102 kg (224 lb 9.6 oz)  Height: 177.8 cm (5' 10)    Physical Exam Vitals and nursing note reviewed.  Constitutional:      General: He is not in acute distress.    Appearance: Normal appearance.  HENT:     Head: Normocephalic and atraumatic.     Nose: Nose normal.     Mouth/Throat:     Mouth: Mucous membranes are moist.   Eyes:     Extraocular Movements: Extraocular movements intact.     Conjunctiva/sclera: Conjunctivae normal.    Cardiovascular:     Rate and Rhythm: Normal rate and regular rhythm.     Pulses: Normal pulses.     Heart sounds: Normal heart sounds.  Pulmonary:     Effort: Pulmonary effort is normal.   Abdominal:     General: Abdomen is flat.   Musculoskeletal:        General: Normal range of motion.     Cervical back: Normal range of motion.   Skin:    General: Skin is warm.   Neurological:     Mental Status: He is alert.   Psychiatric:        Mood and Affect: Affect is tearful.     Results  LABS Labs Reviewed  COMPREHENSIVE METABOLIC PANEL - Abnormal      Result Value   Sodium 140     Potassium 4.1     Chloride 106     CO2 29     Anion Gap 5 (*)    Glucose, Random 101 (*)    Blood Urea Nitrogen (BUN) 12     Creatinine 0.78     eGFR >90     Albumin 4.3     Total Protein 6.9     Bilirubin, Total 0.3     Alkaline Phosphatase (ALP) 47     Aspartate Aminotransferase (AST) 12 (*)    Alanine Aminotransferase (ALT) 9     Calcium 8.8     BUN/Creatinine Ratio      URINALYSIS WITH REFLEX TO MICROSCOPIC - Abnormal   Color, Urine Yellow     Clarity, Urine Clear     Specific Gravity, Urine  1.028 (*)    pH, Urine 5.5     Protein, Urine 10     Glucose, Urine Negative     Ketones, Urine Trace     Bilirubin, Urine Negative     Blood, Urine Trace     Nitrite, Urine Negative     Leukocyte Esterase, Urine Negative     Urobilinogen, Urine 2.0 (*)    WBC, Urine 0-5     RBC, Urine 3-5 (*)    Bacteria, Urine None Seen    ETHANOL - Normal   Ethanol <10    DRUG OF ABUSE 7 PANEL - Normal   Amphetamines Screen, Urine Negative     Barbiturates Screen, Urine Negative     Benzodiazepines Screen, Urine Negative     Cocaine Screen, Urine Negative     Opiates Screen, Urine Negative     Fentanyl Screen, Urine Negative     Marijuana (THC) Screen, Urine Negative     Creatinine, Urine 444     Narrative:    These results are for medical purposes only.  The screening procedure aims to detect the presence of drugs and it is regarded as presumptive (qualitative analysis).  Positive screening detection requires confirmation using more sensitive and specific quantitative methods, such as  high performance liquid chromatography and mass spectrometry.  CBC WITH DIFFERENTIAL   WBC 6.05     RBC 4.83     Hemoglobin 15.3     Hematocrit 44.6     Mean Corpuscular Volume (MCV) 92.4     Mean Corpuscular Hemoglobin (MCH) 31.6     Mean Corpuscular Hemoglobin Conc (MCHC) 34.2     Red Cell Distribution Width (RDW) 14.2     Platelet Count (PLT) 236     Mean Platelet Volume (MPV) 8.1     Neutrophils % 46     Lymphocytes % 43     Monocytes % 8     Eosinophils % 2     Basophils % 1     Neutrophils Absolute 2.80     Lymphocytes # 2.60     Monocytes # 0.50     Eosinophils # 0.10     Basophils # 0.10       Medications Given in Emergency Department   Medications  acetaminophen  (TYLENOL ) tablet 650 mg (has no administration in time range)  QUEtiapine  (SEROquel ) tablet 200 mg (200 mg oral Given 05/07/24 0310)    Medical Decision Making  Medical Decision Making Problems Addressed: Auditory hallucination: chronic illness or injury with exacerbation, progression, or side effects of treatment Schizoaffective disorder, unspecified type    (CMD): chronic illness or injury with exacerbation, progression, or side effects of treatment Suicidal ideation: complicated acute illness or injury with systemic symptoms that poses a threat to life or bodily functions  Amount and/or Complexity of Data Reviewed External Data Reviewed: notes. Labs: ordered. Decision-making details documented in ED Course.  Risk OTC drugs. Prescription drug management. Decision regarding hospitalization.      Additional Information: I reviewed the patient's past medical history, including notes from our facility and what is available in CareEverywhere.  Differentials considered: depression, suicidal attempt, anxiety, psychosis, delerium, drug/alcohol abuse/dependence, schizophrenia, adjustment reaction  On my initial exam, the pt was hemodynamically stable, nontoxic-appearing in no acute distress.  Physical  examination as described above.  Upon workup today CBC without leukocytosis, anemia or thrombocytopenia.  CMP without significant electrolyte derangement pain and renal function within normal limits.  Urine without signs of infection.  Ethanol less than 10.  UDS negative.  Patient has been medically clear for psychiatric evaluation.  Final disposition pending eval.    The following decision tools were used to help assess clinical picture and make decisions: pts history, exam, labs  ED Clinical Impression   Clinical Complexity Number and complexity of problems addressed: Patient's presentation is most consistent with severe exacerbation of chronic illness.  Amount and/or complexity of data reviewed, analyzed: Assessment requiring independent historians used:  External notes reviewed: Prior PCP visit, prior emergency department visits from outside hospital, prior subspecialist consultation visit, prior hospitalization  Lab results personally reviewed by me and considered in medical decision making of this patient's treatment and disposition, and pertinent information noted in ED course  Radiology imaging reviewed and interpreted personally by me and pertinent information noted in ED Course  All radiology studies reviewed independently, additionally reading provided by radiologist available, unless otherwise noted.   Plan: Pt care was handed off to Dr. Faustino at 0600.  Complete history and physical and current plan have been communicated. At time of handoff plan was to await psychiatric evaluation to determine disposition.  Please refer to their note for the remainder of ED care and ultimate disposition.   1. Suicidal ideation   2. Auditory hallucination   3. Schizoaffective disorder, unspecified type    (CMD)      Medication List     ASK your doctor about these medications    divalproex  500 mg 12 hr tablet Commonly known as: DEPAKOTE  DR Take 1 tablet (500 mg total) by mouth every  12 (twelve) hours.   QUEtiapine  200 mg tablet Commonly known as: SEROquel  Take 1 tablet (200 mg total) by mouth at bedtime.       FOLLOW UP No follow-up provider specified. _____________________________  AttestationBETHA Charleen Fox, PA-C obtained and performed the history, physical exam and medical decision making elements that were entered into the chart.  05/07/2024  5:11 AM

## 2024-05-13 NOTE — ED Provider Notes (Signed)
 Emergency Department Provider Note  Dragon voice dictation used for charting.    Provider at bedside: 6:33 AM  History obtained from the: Patient  History   Chief Complaint  Patient presents with  . Psychsocial Concerns     HPI  Albert [name] is a 48 y.o. male who presents to the ED with complaints of suicidal ideation and hallucinations.  The patient was recently discharged from the [facility] unit and states he has had difficulty sleeping.  He notes that he feels lonely at home and sleeps better here in the hospital.  He notes issues with chronic auditory hallucinations and states that these voices often taught him.  He denies any voices telling him to harm himself.  He endorses months of intermittent suicidal ideation with a plan to shoot himself.  When asked if he has a firearm, he states that he does not, but my friend does and I could pay him.  He denies any HI or VH.  Patient denies any tobacco use, recreational drug use, or alcohol use.  He is not sure as to any pertinent family medical history.    No LMP for male patient.   Past Medical History Medical History[1]  Past Surgical History Surgical History[2]    Allergies Allergies[3]   Family History Family History[4]   Social History Social History[5]    Physical Exam   Vitals:   05/13/24 0419 05/13/24 0700 05/13/24 1142  BP: 139/86 (!) 129/101 (!) 131/99  BP Location: Right arm Right arm Right arm  Patient Position: Sitting Sitting Lying  Pulse: 88 85 88  Resp: 18 16 17   Temp: 97.8 F (36.6 C)  97.9 F (36.6 C)  TempSrc: Oral  Oral  SpO2: 98% 97% 97%  Weight: 101 kg (222 lb)    Height: 177.8 cm (5' 10)      Physical Exam Vitals and nursing note reviewed.  Constitutional:      General: He is not in acute distress.    Appearance: He is well-developed. He is not toxic-appearing.  HENT:     Head: Normocephalic and atraumatic.     Right Ear: External ear normal.     Left Ear: External ear  normal.   Eyes:     General: Lids are normal.     Conjunctiva/sclera: Conjunctivae normal.    Cardiovascular:     Rate and Rhythm: Normal rate and regular rhythm.     Pulses: Normal pulses.     Heart sounds: Normal heart sounds.  Pulmonary:     Effort: Pulmonary effort is normal.     Breath sounds: Normal breath sounds.   Neurological:     Mental Status: He is alert.   Psychiatric:        Mood and Affect: Mood normal.        Behavior: Behavior is cooperative.        Thought Content: Thought content includes suicidal ideation. Thought content does not include homicidal ideation. Thought content includes suicidal plan. Thought content does not include homicidal plan.     Comments: Patient does not appear to be responding to internal stimuli.  He does have a strange affect     Labs   Lab Results (last 24 hours)     ** No results found for the last 24 hours. **         Radiology   Radiology Results (last 72 hours)     ** No results found for the last 72 hours. **  EKG     No results found for this visit on 05/13/24.   ED Course   ED Course as of 05/17/24 2057  Wed May 13, 2024  1047 duwayne [AS]    ED Course User Index [AS] Peyton PARAS Schlagheck, PA-C    Procedure Note   Procedures  Medical Decision Making   Clinical Complexity  Patient's presentation is most consistent with exacerbation of chronic illness.     Provider time spent in patient care today, inclusive of but not limited to clinical reassessment, review of diagnostic studies, and discharge preparation, was greater than 30 minutes.    Medical Decision Making Problems Addressed: Auditory hallucinations: complicated acute illness or injury Schizoaffective disorder, unspecified type    (CMD): complicated acute illness or injury Suicidal ideation: complicated acute illness or injury  Amount and/or Complexity of Data Reviewed Labs: ordered. Decision-making details documented  in ED Course.     Differential diagnosis includes but is not limited to DDX: depression, suicidal attempt, anxiety, psychosis, delerium, drug/alcohol abuse/dependence, schizophrenia, adjustment reaction, malingering   ED Clinical Impression   1. Schizoaffective disorder, unspecified type    (CMD)   2. Suicidal ideation   3. Auditory hallucinations      ED Assessment/Plan  Patient presented to the ED for auditory hallucinations and suicidal ideation.  Upon arrival to patient, he was resting on the hospital bed in ED [facility] in no acute distress.  Physical exam was remarkable for the above findings.  CBC and CMP were unremarkable.  Serum ethanol was not elevated.  UDS was negative.  Patient was medically cleared.  I discussed him with the [facility] assessment team member [place] as well as psych NP Duane.  After evaluating the patient, psychiatry did not feel he met inpatient criteria.  Although the patient does note suicidal ideation, from the discharge note from his last admission, there was concern that he has poor insight and prefers hospitalization to living alone.  The note also states does not benefit from continued inpatient stay as symptoms can be managed on an outpatient basis.  From my interaction with this gentleman, a large part of his presentation today does seem for secondary gain of social interaction and so he can sleep in a and what he says is a more comfortable area.  The assessment team was able to move up his appointment with psychiatry so he will be able to be seen tomorrow.  Patient was later discharged in stable condition. ED Meds Given During Visit Medications - No data to display    Discharge Medication List as of 05/13/2024 11:33 AM        FOLLOW UP Aliene DELENA Colon, MD [address] [place] [place] [id number] [phone]   As needed  [facility] -  EMERGENCY DEPARTMENT [address] [place]   [id number] [phone]  As needed   Electronically signed by: 6:33 AM 05/17/2024 for Allyson Schlagheck PA-C        [1] Past Medical History: Diagnosis Date  . Anxiety   . Autistic behavior   . Hallucinations   . Malingering   . Panic attacks 03/17/2024  . Paranoid schizophrenia    (CMD) 06/05/2018  . Psychosis    (CMD)   . Schizoaffective disorder, bipolar type    (CMD) 01/04/2017  [2] No past surgical history on file. [3] Allergies Allergen Reactions  . Chicken Meat Extract Other (See Comments)    Pt reports he doesn't  eat chicken  . Thorazine Mental Status Changes    Makes pt feel suicidal  [4] Family History Problem Relation Name Age of Onset  . Thyroid  disease Mother    . Depression Maternal Aunt    [5] Social History Tobacco Use  . Smoking status: Never  . Smokeless tobacco: Never  Substance Use Topics  . Alcohol use: Never  . Drug use: Never

## 2024-05-22 NOTE — ED Provider Notes (Signed)
 [facility] Emergency Department Emergency Department Provider Note  Provider at Bedside:  05/22/2024 6:36 AM  Chief Complaint: Psychsocial Concerns  History of Present Illness:  History obtained from: Patient  Albert [name] is a 48 y.o. male with PMHx of schizoaffective disorder, autistic behavior who presents to the ED with complaints of hallucinations. Patient reports that at 2300 yesterday he began having auditory hallucinations that were instructing him to harm himself. He proceeded to develop a grating headache, chest pressure, and a tingling sensation in the bilateral feet. Patient began communicating with a crisis hotline and was subsequently instructed to present to the ED.  ______________________ ROS: Pertinent positives and negatives per HPI. Pertinent past medical, surgical, social and family history records were reviewed. Current Medications and Allergies were reviewed.  Physical Exam   Vitals:   05/22/24 0524 05/22/24 0759  BP: 127/83 119/74  BP Location: Right arm Right arm  Patient Position: Sitting Lying  Pulse: 68 96  Resp: 17 16  Temp: 98.1 F (36.7 C) 97.8 F (36.6 C)  TempSrc: Oral Oral  SpO2: 99% 95%  Weight: 102 kg (225 lb 6.4 oz)   Height: 177.8 cm (5' 10)       Physical Exam Vitals and nursing note reviewed.  Constitutional:      General: He is not in acute distress.    Appearance: Normal appearance. He is normal weight. He is not ill-appearing.  HENT:     Head: Normocephalic and atraumatic.     Right Ear: External ear normal.     Left Ear: External ear normal.     Nose: Nose normal.     Mouth/Throat:     Mouth: Mucous membranes are moist.     Pharynx: Oropharynx is clear.   Eyes:     Pupils: Pupils are equal, round, and reactive to light.    Cardiovascular:     Rate and Rhythm: Normal rate and regular rhythm.     Heart sounds: Normal heart sounds.  Pulmonary:     Effort: Pulmonary effort is normal. No  respiratory distress.     Breath sounds: Normal breath sounds.  Abdominal:     Palpations: Abdomen is soft.     Tenderness: There is no abdominal tenderness.   Musculoskeletal:        General: Normal range of motion.     Cervical back: Normal range of motion and neck supple.   Skin:    General: Skin is warm.   Neurological:     General: No focal deficit present.     Mental Status: He is alert.   Psychiatric:        Mood and Affect: Mood normal.        Behavior: Behavior normal.     Results   EKG Impression:   None  Labs: Lab Results (last 24 hours)     Procedure Component Value Ref Range Date/Time   Ethanol [[id number]]  (Normal) Collected: 05/22/24 0626   Lab Status: Final result Specimen: Blood from Venous Updated: 05/22/24 0716    Ethanol <10 <10 mg/dL    Fentanyl Analogues and Metabolites, Urine LC-MS/MS [[id number]] Collected: 05/22/24 0538   Lab Status: In process Specimen: Urine from Clean Catch Updated: 05/22/24 0544   Drug of Abuse 7 Panel [[id number]]  (Normal) Collected: 05/22/24 0538   Lab Status: Final result Specimen: Urine from Clean Catch Updated: 05/22/24 9374    Amphetamines Screen, Urine Negative Negative     Comment: Cutoff = 1000 ng/mL  Barbiturates Screen, Urine Negative Negative     Comment: Cutoff = 200 ng/mL      Benzodiazepines Screen, Urine Negative Negative     Comment: Cutoff = 200 ng/mL      Cocaine Screen, Urine Negative Negative     Comment: Cutoff = 300 ng/mL      Opiates Screen, Urine Negative Negative     Comment: Cutoff = 300 ng/mL      Fentanyl Screen, Urine Negative Negative     Comment: Cutoff = 1 ng/mL      Marijuana (THC) Screen, Urine Negative Negative     Comment: Cutoff = 50 ng/mL      Creatinine, Urine 412 >=20 mg/dL    Narrative:     These results are for medical purposes only.  The screening procedure aims to detect the presence of drugs and it is regarded as presumptive (qualitative analysis).  Positive  screening detection requires confirmation using more sensitive and specific quantitative methods, such as high performance liquid chromatography and mass spectrometry.       Patient's toxicology screen alcohol level was less than 10.    CXR Impression: (Interpreted by me) None performed.  Impression of additional imaging studies include: None performed  Imaging: Radiology Results (last 72 hours)     ** No results found for the last 72 hours. **        Procedures   Procedures  Evidence Based Calculators      ED Course   ED Course as of 05/22/24 1030  Fri May 22, 2024  0650 Patient's initial vital signs are stable. [LT]  9349 Intervention-patient is medically cleared for behavioral health evaluation.  Patient will be placed on psych observation. [LT]  [phone] The patient has been placed in psychiatric observation due to the need to provide a safe environment for the patient while obtaining psychiatric consultation and evaluation, as well as ongoing medical and medication management to treat the patient's condition.  The patient has not been placed under full IVC at this time.    [LT]  1027 Patient was evaluated by our behavioral health team and they are going to refer him out to outside facilities for admission. Pt will be signed out to Dr. Laurelyn at 3pm.   [LT]    ED Course User Index [LT] Rock Dannielle Birmingham, MD    Medical Decision Making   External records were reviewed: I have reviewed Discharge Summary from 05/11/2024 following admission for acute exacerbation of chronic schizoaffective disorder.  _________________________  Albert [name] is a 48 y.o. male who presents to the ED with complaints of hallucinations. Patient reports that at 2300 yesterday he began having auditory hallucinations that were instructing him to harm himself. He proceeded to develop a grating headache, chest pressure, and a tingling sensation in the bilateral feet. Patient began  communicating with a crisis hotline and was subsequently instructed to present to the ED. On my initial evaluation, Patient is in no acute distress, he seems to be much less paranoid since he arrived to the ER, states that he is still hearing voices..  The following differentials were considered: DDX: depression, suicidal attempt, anxiety, psychosis, delerium, drug/alcohol abuse/dependence, schizophrenia, adjustment reaction.    Pertinent studies were obtained, with results listed in chart above.  results interpreted as above.  Based on ED workup, findings are consistent with hearing voices, suicidal ideations, history of schizophrenia.    Patient received no medications in the ER.  On reevaluation, symptoms are improved.  Clinical  Assessment/Plan: Patient presented to the ER with hearing voices that were telling him to harm himself and he said that they told him to jump off a bridge.  Patient states that he was still hearing voices on arrival to the ER and still felt suicidal.  Patient stated though that he felt safer since he was at the ER.  Patient had routine toxicology screen and alcohol level which were both negative for any acute findings.  Patient was evaluated by our behavioral health team and they are referring him out to outside facilities for admissions.  Patient will be signed out to Dr. Laurelyn at 3 PM.  Discussion of management or test interpretation with external provider(s): Behavioral health/psychiatry has been consulted   Clinical Complexity/Risk   Patient's presentation is most consistent with acute presentation with potential threat to life or bodily function.  Patient's impaired access to primary care increases the complexity of managing their  presentation with haluciantions.    Pt presentation is complicated by their history of Psychiatric illness, resulting in increased risk of frequent ED usage  Provider time spent in patient care today, inclusive of but not limited  to clinical reassessment, review of diagnostic studies, and discharge preparation, was greater than 30 minutes.  OTC medications: no, pending behavioral health disposition Prescription medications discussed: no, pending behavioral health disposition  ED Clinical Impression   Diagnoses that have been ruled out:  None  Diagnoses that are still under consideration:  None  Final diagnoses:  Hearing voices  Suicidal ideations  History of schizophrenia    ED Assessment/Plan   ED Disposition     None       DISCHARGE MEDICATIONS   Medication List     ASK your doctor about these medications    divalproex  500 mg 12 hr tablet Commonly known as: DEPAKOTE  DR Take 1 tablet (500 mg total) by mouth 2 (two) times a day.   omeprazole 40 mg DR capsule Commonly known as: PriLOSEC Take 40 mg by mouth in the morning.   QUEtiapine  300 mg 24 hr tablet Commonly known as: SEROquel  XR Take 1 tablet (300 mg total) by mouth at bedtime.        FOLLOW UP No follow-up provider specified.  ____________________________ Scribe's Attestation: This document serves as a record of services personally performed by Rock Birmingham, MD. It was created on their behalf by Deatrice VEAR Bathe, Scribe, a trained medical scribe. The creation of this record is the provider's dictation and/or activities during the visit.   Electronically signed by: Rock Dannielle Birmingham, MD 05/22/2024 6:36 AM

## 2024-06-01 NOTE — Discharge Summary (Signed)
 ------------------------------------------------------------------------------- Attestation signed by Powell Berg, MD at 06/01/2024  4:07 PM I saw and evaluated the patient.  We discussed multiple ways that he can manage his symptoms at home which includes close follow up with his psychiatrist, changing to a group home (resources given) as he does not like living alone, and attending the day program to increase socialization.  He has also been given a referral to a peer support specialist.  He denies SI/HI/AVH.  He does continue to report a chronic delusion that the computer program mother is able to cause an aneurysm and that the program is speaking to him at home.  He denies side effects to his medications and they will be filled before discharge.  He reports feeling ok today and states his sleep and appetite were adequate.   I have reviewed the notes, assessments, and/or procedures performed by NP Modoor, I concur with her/his documentation of Albert [name].   -------------------------------------------------------------------------------  [facility] - Discharge Summary  Date of Admission: 05/22/2024 Date of Discharge: 06/01/2024 Attending Provider: Powell Berg, MD Hospital LOS:  10 days  Time Spent performing discharge services:  - 40 minutes  Discharge Diagnoses and Medications    Principal Problem:   Schizoaffective disorder, bipolar type    (CMD)  Discharge Medications:     Medication List     CHANGE how you take these medications    divalproex  250 mg 12 hr tablet Commonly known as: DEPAKOTE  DR Take 3 tablets (750 mg total) by mouth 2 (two) times a day. What changed:  medication strength how much to take   QUEtiapine  400 mg 24 hr tablet Commonly known as: SEROquel  XR Take 2 tablets (800 mg total) by mouth at bedtime. What changed:  medication strength how much to take       CONTINUE taking these medications    omeprazole 40 mg DR  capsule Commonly known as: PriLOSEC Take 40 mg by mouth in the morning.         Where to Get Your Medications     These medications were sent to [facility]  [address], [place] [address]    Hours: Mon-Fri 8:30am-5pm; Sat-Sun: Closed; Holidays: Closed Phone: [phone]  divalproex  250 mg 12 hr tablet QUEtiapine  400 mg 24 hr tablet     Risks, benefits, and side effects were discussed in detail prior to discharge. Hospital Course   Consulting Services: none Indication for Admission: Saleem Coccia is a 48 y.o. male with a medical history of  GERD and a psychiatric history significant for Schizoaffective disorder bipolar type, autistic behaviors who presented to the ED having auditory hallucinations back to where instructing him to harm himself with a plan to shoot himself with his friend Corey's firearm.  Alcohol less than 10 and UDS negative.   During a BH unit evaluation, patient is alert and oriented.  He is anxious and moderately irritable during assessment.    Patient repeatedly states that I am mentally F.  Patient is a recently admitted and discharged.  He has been taking Seroquel  and increased to 400 mg at nighttime.  States that he is continues to be hearing voices but denies any thoughts of hurting self or others.  Patient does not want to answer any further questions.   Due to severity of symptoms and failure of outpatient management of patient's symptoms, the patient currently meets criteria for inpatient psychiatric care for medication management and acute crisis stabilization.    Suicidal Ideation:  Denies Homicidal Ideation: Denies   ED Provider note,  Saxton Chain is a 48 y.o. male with PMHx of schizoaffective disorder, autistic behavior who presents to the ED with complaints of hallucinations. Patient reports that at 2300 yesterday he began having auditory hallucinations that were instructing him to harm himself. He  proceeded to develop a grating headache, chest pressure, and a tingling sensation in the bilateral feet. Patient began communicating with a crisis hotline and was subsequently instructed to present to the ED.   RN triage note on admission, Pt states he was using a computer program called mom and he says he got to the point where he was feeling and discussing suicidal thoughts and plans     Per consult note, currently on assessment patient is observed sitting in his bed awake. States he came to the hospital because mom told him to.  He explains that mom is a Lobbyist that is in his head.  He feels like this computer program has taken control of his brain and feels like at times his head is being beat with a hammer.  Mom has also told him that he needs to die and that he should kill himself.  He has thought about shooting himself with a firearm.  This voice becomes more intrusive at night.  Mom told his friend Joane to give him a gun and he believes that Joane also placed a bullet in his hand.  He denies any previous suicide attempts.  He currently does not own a firearm but states he can get access through his friend Joane.  He is delusional.  He is anxious and becomes irritable/agitated with questions throughout the assessment.  He is unable to verbally contract for safety at this time as he feels that mom's voice is loud and he may actually act on it.  He denies any homicidal ideations.  However he believes that mom is homicidal and wants to hurt others.  Mom is the only voice that he is currently hearing.  He denies visual hallucinations.  He currently does not appear to be responding to internal/external stimuli.    Of note patient reports he did follow-up with Dr. Jess  yesterday and he states that Dr. Jess told him to present to the ED because he needed to be admitted to the psychiatric hospital.  Call Contact [phone]- Per staff patient did make it to his  appointment. Staff is unable to disclose any other information and Dr. Jess not available at this time.   On admission Pt is voluntarily admitted.   Consult: nutritional Precautions: suicide and elopement Valproic acid  level was 53 on 06/01/2024.   Alcohol Biomarkers: MCV: /GGT: /AST: /ALT:NA UDS: Negative Fentanyl: Negative   Alcohol: Negative Imaging <redacted file path>: No results found. Hgb A1c: 5.7, 03/17/24   Lipid Profile:TC 179, HDL 45, LDL 117 and TC 179.  TSH: 2.476 EKG Qtc: 439  UA: WNL  8/3 increase seroquel  XR 800 mg at bedtime  8/2 - Seroquel  XR 600 mg po at bedtime  8/1- Depakote  DR 750 mg po bid  During the course of the hospitalization: Patient medication adjusted.  Depakote  and Seroquel  increased.  He has been tolerating well with his medications.  Tolerating well with increased Seroquel  to control his voices and SI.  Seroquel  tapered up to 800 mg at nighttime.  He denies any side effect.  Per note on 8/3. Pt found in bed. He states he continues to have rough nights. States the voices  occur during the day sometimes but most often at night when it is quiet and his mind is empty. He states the computer takes over then. He denies that the seroquel  makes him sleepy during the day, states he is sleepy now because he isn't getting good rest at night.  Tolerating increase with Seroquel .  Tolerating Depakote  750 mg twice daily.  Depakote  level was 53 on 06/01/2024.  Patient has been medication compliant and tolerating.  He states that feeling much better today and slept well last night.  He states that he is much better and slept well.  Denies hearing voices.    On 06/01/2024, patient states that he slept well last night and his appetite fairly well.  He denies hearing voices or seeing things.  Repeated denial of suicidal, homicidal, and other violent ideation,  interaction with peers, participation in groups while on the unit, and denial of adverse reactions from medications,  the treatment team decided Malakai Schoenherr was stable for discharge home with scheduled mental health treatment as noted below.  Medication changes during this hospitalization: See above  Justification for two or more antipsychotic medications: No Tobacco/Substance Use Recommendation   Tobacco use 30 days prior to admission? No  Referral to outpatient treatment for Substance/Tobacco use disorder was not indicated.  When applicable, scheduled referrals are listed below.  FDA-approved cessation medication prescription offered/prescribed: N/A  Condition Upon Discharge    Vitals:  Vitals: BP: (!) 145/99, Temp: 97.7 F (36.5 C), Temp Source: Oral, Heart Rate: 100, Resp: 18, SpO2: 99 %;    Mental Status Exam  General Appearance: Appropriately dressed and groomed, Not internally preoccupied or responding to internal stimuli, Fair eye contact and Cooperative Gait and Station: Normal Strength and Tone: Normal Attention and Concentration: Normal Orientation: Oriented x4 Language: Normal Fund of KNowledge: Average Recent and Remote Memory: No impairment in recent or remote Speech: Normal in rate, tone and volume Thought Process: Goal directed and Logical Associations: Intact Affect: Appropriate to stated mood/thought content Thought Content Related to Harm to Self or Others: Denies suicidal thoughts, Denies suicidal intent and Denies Homicidal ideation Thought Content Not Related to Dangerousness: No disturbance in thought content Perceptions: No auditory hallucinations and No visual hallucinations Insight: Fair Judgement: Good  Discharge Instructions and Disposition   Discharge Orders     Lifting Limits     Details:    Lifting Limits: No lifting limits       Discharge Follow-Up Plan:   Clinical Follow-up (MH and/or SA Services):  Name of Provider Agency Referred: [facility] Date/Time of Appointment: 06/03/2024 (Thursday) at 11:20 Phone: [phone] Address: [address], [place], [place] [address] Reason: MENTAL HEALTH TREATMENT   Transportation: Drive Self   **Please bring photo ID, social security card, insurance card, or proof of household income if they do not have insurance.**   Crisis Support Line: [phone]   AND   Discharge Follow-Up Plan:   Clinical Follow-up (MH and/or SA Services):  Name of Provider Agency Referred: [facility] Date/Time of Appointment: You have been referred for Peer Support Services- please follow up.  Phone: [phone] Fax: [phone] Address: [facility] (near [employer] [facility]) [address], [address]  Reason: PEER SUPPORT   Transportation:     **Please bring photo ID, social security card, insurance card, or proof of household income if they do not have insurance.**   Crisis Support Line: [phone]    Additional Resources [facility] [address]   Peer Warmline[facility]HA [facility] [place] [phone]) 24/7   [place], [place] [address] [phone]   The patient was referred to the providers listed above at the appointment time listed above for the treatment of behavioral health.  Disposition: Discharge to home  Recommendations to providers: none  I have discussed the case with Dr. Vicenta MD, who has assisted in the formulation of the assessment and plan.

## 2024-06-13 ENCOUNTER — Encounter (HOSPITAL_COMMUNITY): Payer: Self-pay | Admitting: Emergency Medicine

## 2024-06-13 ENCOUNTER — Other Ambulatory Visit: Payer: Self-pay

## 2024-06-13 ENCOUNTER — Emergency Department (HOSPITAL_COMMUNITY)
Admission: EM | Admit: 2024-06-13 | Discharge: 2024-06-13 | Discharge status: Against Medical Advice | Source: Home / Self Care

## 2024-06-13 DIAGNOSIS — R4781 Slurred speech: Secondary | ICD-10-CM | POA: Insufficient documentation

## 2024-06-13 DIAGNOSIS — F200 Paranoid schizophrenia: Secondary | ICD-10-CM | POA: Diagnosis not present

## 2024-06-13 DIAGNOSIS — Z5321 Procedure and treatment not carried out due to patient leaving prior to being seen by health care provider: Secondary | ICD-10-CM | POA: Insufficient documentation

## 2024-06-13 DIAGNOSIS — R4182 Altered mental status, unspecified: Secondary | ICD-10-CM | POA: Insufficient documentation

## 2024-06-13 DIAGNOSIS — R45851 Suicidal ideations: Secondary | ICD-10-CM | POA: Diagnosis not present

## 2024-06-13 NOTE — ED Triage Notes (Addendum)
 Pt BIb [facility] from home for disconnecting thoughts that makes him think he has a brain aneurysm.  Does not complain of HA at this time.  Picturing dots in his mind.  Extensive mental health hx per EMS. No SI or HI at this time.   149/90 HR 80 RR 16 98% cbg112  PT states he took his 400mg  dose of Seroquel  this morning as well as his depakote . Says he normally takes it so he can sleep during the day.  Didn't sleep last night.  States he feels he is slurring his speech. No slurred speech noted by RN.  Pt states he does not feel like himself today, less spark than normal.

## 2024-06-13 NOTE — ED Provider Triage Note (Signed)
 Emergency Medicine Provider Triage Evaluation Note  Albert [name] , a 48 y.o. male  was evaluated in triage.  Pt complains of altered mental status. Consider psychiatric history. Denies hallucinations but repeatedly stating I don't know what they want me to say and I'm having bad thoughts but will not elaborate further. No reported alcohol or substance use recently.  Review of Systems  Positive: As above Negative: As above  Physical Exam  BP (!) 112/30   Pulse 89   Temp 97.7 F (36.5 C) (Oral)   Resp 16   SpO2 96%  Gen:   Awake, anxious  Resp:  Normal effort  MSK:   Moves extremities without difficulty  Other:    Medical Decision Making  Medically screening exam initiated at 12:04 PM.  Appropriate orders placed.  Albert [name] was informed that the remainder of the evaluation will be completed by another provider, this initial triage assessment does not replace that evaluation, and the importance of remaining in the ED until their evaluation is complete.     Lynkin Saini A, PA-C 06/13/24 1206

## 2024-06-14 ENCOUNTER — Encounter (HOSPITAL_COMMUNITY): Payer: Self-pay

## 2024-06-14 ENCOUNTER — Emergency Department (EMERGENCY_DEPARTMENT_HOSPITAL)
Admission: EM | Admit: 2024-06-14 | Discharge: 2024-06-14 | Disposition: A | Discharge status: Other Acute Inpatient Hospital | Source: Home / Self Care | Attending: Emergency Medicine | Admitting: Emergency Medicine

## 2024-06-14 ENCOUNTER — Inpatient Hospital Stay (HOSPITAL_COMMUNITY)
Admission: AD | Admit: 2024-06-14 | Discharge: 2024-06-21 | DRG: 885 | Disposition: A | Discharge status: Home / Self Care | Source: Intra-hospital

## 2024-06-14 DIAGNOSIS — F418 Other specified anxiety disorders: Secondary | ICD-10-CM | POA: Insufficient documentation

## 2024-06-14 DIAGNOSIS — F840 Autistic disorder: Secondary | ICD-10-CM | POA: Diagnosis present

## 2024-06-14 DIAGNOSIS — Z91018 Allergy to other foods: Secondary | ICD-10-CM

## 2024-06-14 DIAGNOSIS — Z818 Family history of other mental and behavioral disorders: Secondary | ICD-10-CM | POA: Diagnosis not present

## 2024-06-14 DIAGNOSIS — R443 Hallucinations, unspecified: Secondary | ICD-10-CM

## 2024-06-14 DIAGNOSIS — F200 Paranoid schizophrenia: Principal | ICD-10-CM | POA: Diagnosis present

## 2024-06-14 DIAGNOSIS — R45851 Suicidal ideations: Secondary | ICD-10-CM | POA: Diagnosis present

## 2024-06-14 DIAGNOSIS — Z888 Allergy status to other drugs, medicaments and biological substances status: Secondary | ICD-10-CM | POA: Diagnosis not present

## 2024-06-14 DIAGNOSIS — Z79899 Other long term (current) drug therapy: Secondary | ICD-10-CM

## 2024-06-14 DIAGNOSIS — F250 Schizoaffective disorder, bipolar type: Secondary | ICD-10-CM | POA: Diagnosis present

## 2024-06-14 LAB — COMPREHENSIVE METABOLIC PANEL WITH GFR
ALT: 11 U/L (ref 0–44)
AST: 17 U/L (ref 15–41)
Albumin: 4 g/dL (ref 3.5–5.0)
Alkaline Phosphatase: 42 U/L (ref 38–126)
Anion gap: 8 (ref 5–15)
BUN: 11 mg/dL (ref 6–20)
CO2: 22 mmol/L (ref 22–32)
Calcium: 9 mg/dL (ref 8.9–10.3)
Chloride: 107 mmol/L (ref 98–111)
Creatinine, Ser: 0.89 mg/dL (ref 0.61–1.24)
GFR, Estimated: >60 mL/min (ref >60)
Glucose, Bld: 101 mg/dL — ABNORMAL HIGH (ref 70–99)
Potassium: 4.1 mmol/L (ref 3.5–5.1)
Sodium: 137 mmol/L (ref 135–145)
Total Bilirubin: 0.5 mg/dL (ref 0.0–1.2)
Total Protein: 7.4 g/dL (ref 6.5–8.1)

## 2024-06-14 LAB — RAPID URINE DRUG SCREEN, HOSP PERFORMED
Amphetamines: NOT DETECTED
Barbiturates: NOT DETECTED
Benzodiazepines: NOT DETECTED
Cocaine: NOT DETECTED
Opiates: NOT DETECTED
Tetrahydrocannabinol: NOT DETECTED

## 2024-06-14 LAB — CBC
HCT: 46 % (ref 39.0–52.0)
Hemoglobin: 15.9 g/dL (ref 13.0–17.0)
MCH: 31.5 pg (ref 26.0–34.0)
MCHC: 34.6 g/dL (ref 30.0–36.0)
MCV: 91.3 fL (ref 80.0–100.0)
Platelets: 319 K/uL (ref 150–400)
RBC: 5.04 MIL/uL (ref 4.22–5.81)
RDW: 12.9 % (ref 11.5–15.5)
WBC: 9 K/uL (ref 4.0–10.5)
nRBC: 0 % (ref 0.0–0.2)

## 2024-06-14 LAB — ETHANOL: Alcohol, Ethyl (B): <15 mg/dL (ref <15)

## 2024-06-14 MED ORDER — DIVALPROEX SODIUM 500 MG PO DR TAB
750.0000 mg | DELAYED_RELEASE_TABLET | Freq: Two times a day (BID) | ORAL | Status: DC
  Administered 2024-06-14: 750 mg via ORAL
  Filled 2024-06-14: qty 1

## 2024-06-14 MED ORDER — ZIPRASIDONE MESYLATE 20 MG IM SOLR
10.0000 mg | Freq: Once | INTRAMUSCULAR | Status: AC
  Administered 2024-06-14: 10 mg via INTRAMUSCULAR
  Filled 2024-06-14: qty 20

## 2024-06-14 MED ORDER — DIPHENHYDRAMINE HCL 50 MG/ML IJ SOLN: 50.0000 mg | Freq: Three times a day (TID) | INTRAMUSCULAR | Status: DC | PRN

## 2024-06-14 MED ORDER — HALOPERIDOL LACTATE 5 MG/ML IJ SOLN: 10.0000 mg | Freq: Three times a day (TID) | INTRAMUSCULAR | Status: DC | PRN

## 2024-06-14 MED ORDER — QUETIAPINE FUMARATE 200 MG PO TABS: 400.0000 mg | ORAL_TABLET | Freq: Every day | ORAL | Status: DC

## 2024-06-14 MED ORDER — DIPHENHYDRAMINE HCL 25 MG PO CAPS
50.0000 mg | ORAL_CAPSULE | Freq: Three times a day (TID) | ORAL | Status: DC | PRN
  Administered 2024-06-17 – 2024-06-20 (×5): 50 mg via ORAL
  Filled 2024-06-14 (×5): qty 2

## 2024-06-14 MED ORDER — ACETAMINOPHEN 325 MG PO TABS
650.0000 mg | ORAL_TABLET | Freq: Four times a day (QID) | ORAL | Status: DC | PRN
  Administered 2024-06-18: 650 mg via ORAL
  Filled 2024-06-14: qty 2

## 2024-06-14 MED ORDER — PANTOPRAZOLE SODIUM 40 MG PO TBEC
80.0000 mg | DELAYED_RELEASE_TABLET | Freq: Every day | ORAL | Status: DC
  Administered 2024-06-15 – 2024-06-21 (×7): 80 mg via ORAL
  Filled 2024-06-14 (×7): qty 2

## 2024-06-14 MED ORDER — DIVALPROEX SODIUM 500 MG PO DR TAB
750.0000 mg | DELAYED_RELEASE_TABLET | Freq: Two times a day (BID) | ORAL | Status: DC
  Administered 2024-06-14 – 2024-06-20 (×13): 750 mg via ORAL
  Filled 2024-06-14 (×13): qty 1

## 2024-06-14 MED ORDER — PANTOPRAZOLE SODIUM 40 MG PO TBEC
80.0000 mg | DELAYED_RELEASE_TABLET | Freq: Every day | ORAL | Status: DC
  Administered 2024-06-14: 80 mg via ORAL
  Filled 2024-06-14: qty 2

## 2024-06-14 MED ORDER — ALUM & MAG HYDROXIDE-SIMETH 200-200-20 MG/5ML PO SUSP: 30.0000 mL | ORAL | Status: DC | PRN

## 2024-06-14 MED ORDER — MAGNESIUM HYDROXIDE 400 MG/5ML PO SUSP: 30.0000 mL | Freq: Every day | ORAL | Status: DC | PRN

## 2024-06-14 MED ORDER — HALOPERIDOL 5 MG PO TABS
5.0000 mg | ORAL_TABLET | Freq: Three times a day (TID) | ORAL | Status: DC | PRN
  Administered 2024-06-17 – 2024-06-20 (×5): 5 mg via ORAL
  Filled 2024-06-14 (×6): qty 1

## 2024-06-14 MED ORDER — HALOPERIDOL LACTATE 5 MG/ML IJ SOLN: 5.0000 mg | Freq: Three times a day (TID) | INTRAMUSCULAR | Status: DC | PRN

## 2024-06-14 MED ORDER — LORAZEPAM 2 MG/ML IJ SOLN: 2.0000 mg | Freq: Three times a day (TID) | INTRAMUSCULAR | Status: DC | PRN

## 2024-06-14 MED ORDER — STERILE WATER FOR INJECTION IJ SOLN
INTRAMUSCULAR | Status: AC
  Administered 2024-06-14: 10 mL
  Filled 2024-06-14: qty 10

## 2024-06-14 MED ORDER — QUETIAPINE FUMARATE 400 MG PO TABS
400.0000 mg | ORAL_TABLET | Freq: Every day | ORAL | Status: DC
  Administered 2024-06-14 – 2024-06-20 (×7): 400 mg via ORAL
  Filled 2024-06-14 (×7): qty 2

## 2024-06-14 NOTE — Progress Notes (Signed)
 BHH/BMU LCSW Progress Note   06/14/2024    5:21 PM  Grantley Nin   [phone]   Type of Contact and Topic:  Psychiatric Bed Placement   Pt accepted to [facility]    Patient meets inpatient criteria per Bernadette Barefoot, NP    The attending provider will be Dr. Raliegh  Call report to [phone]    Flavia Blackbird, RN @ [facility] ED notified.     Pt scheduled  to arrive at [facility] TODAY @ 2030.   Ephrata Verville, MSW, LCSW-A  5:22 PM 06/14/2024

## 2024-06-14 NOTE — ED Notes (Signed)
 Patient was given breakfast at 0921, but was not ready to eat

## 2024-06-14 NOTE — ED Notes (Signed)
 Patient belonging returned

## 2024-06-14 NOTE — ED Notes (Signed)
 Call [facility] per secretary patient nurse is doing her med pass and will call me back.

## 2024-06-14 NOTE — ED Triage Notes (Signed)
 Patient reports he is having some hallucinations. States he has a brain aneurysm and is being controlled by a computer program called mom. Endorses SI but does not give any details. Denies SI. He currently using his two fingers to keep his left eye open because he states it needs air.

## 2024-06-14 NOTE — Consult Note (Signed)
 [facility] Psychiatric Consult Initial  Patient Name: .Albert [name]  MRN: [id number]  DOB: January 30, 1976  Consult Order details:  Orders (From admission, onward)     Start     Ordered   06/14/24 0651  CONSULT TO CALL ACT TEAM       Ordering Provider: Hoy Nidia FALCON, PA-C  Provider:  (Not yet assigned)  Question:  Reason for Consult?  Answer:  hallucinations   06/14/24 0650             Mode of Visit: Tele-visit Virtual Statement:TELE PSYCHIATRY ATTESTATION & CONSENT As the provider for this telehealth consult, I attest that I verified the patient's identity using two separate identifiers, introduced myself to the patient, provided my credentials, disclosed my location, and performed this encounter via a HIPAA-compliant, real-time, face-to-face, two-way, interactive audio and video platform and with the full consent and agreement of the patient (or guardian as applicable.) Patient physical location: [facility] Emergency Department. Telehealth provider physical location: home office in state of [place] .   Video start time: 1200 Video end time: 1230    Psychiatry Consult Evaluation  Service Date: June 14, 2024 LOS:  LOS: 0 days  Chief Complaint   Primary Psychiatric Diagnoses  Paranoid Schizophrenia Schizoaffective Disorder, bipolar type  Assessment  Albert [name] is a 48 y.o. male admitted: Presented to the EDfor 06/14/2024  4:40 AM for  Per ED Provider Admission Note 06/14/2024@0434 : Patient presents with: Hallucinations   Albert [name] is a 48 y.o. male with PMHx bipolar disorder, GERD, paranoid schizophrenia, seizures who presents to ED concerned for hallucinations. Patient stating that he is on medication but did not take it today. Patient stating that his home medications never fully control his hallucinations, but they have been getting worse recently. Patient talking about a microchip in his brain with voices telling him to harm himself - patient is  otherwise not able to provide any more information. When asking patient about recent infectious symptoms, he starts talking about the microchip in his brain again.    He carries the psychiatric diagnoses of bipolar disorder and paranoid schizophrenia and has a past medical history for seizures and GERD.  Patient frequently visit the emergency department including 7 visits within the past 9 months and 3 inpatient hospitalization with [facility].  Of note, patient initially presented to [facility] emergency department on 8/16 but eloped; patient later returned to the ED earlier this morning. His CMP, CBC labs are WNL, no medical contribution to symptoms identified;  he was medically cleared prior to psychiatric assessment.   Psychiatric Assessment  His current presentation of audible hallucinations, delusional thoughts, paranoia and anxiousness is most consistent with his dx of paranoid schizophrenia. As he's experiencing ruminating thoughts of dying that are personally tormenting, and impair his ability to make good decisions-- He meets criteria for psychiatric admission based on above.  Current outpatient psychotropic medications include Depakote  and Seroquel  and historically he has had a good response to these medications. He has a hx for medication non-compliance although he reports prior to admission taking medications as prescribed.   On initial examination, patient presents with bizarre and paranoid behaviors; he has delusional thoughts and appears nervous and uncomfortable.  He is cooperative and attempts to answer questions but he is limited by her current mental decline.  He denies suicidal or homicidal ideation.   Please see plan below for detailed recommendations.   Diagnoses:  Active Hospital problems: Principal Problem:   Paranoid schizophrenia (HCC) Active Problems:  Schizoaffective disorder, bipolar type (HCC)    Plan   ## Psychiatric Medication Recommendations:  Pending updated  EKG, plan to restart home medications Depakote  DR tablet 750mg  BID Seroquel  400mg  po at bedtime  ## Medical Decision Making Capacity: Not specifically addressed in this encounter  ## Further Work-up:  -- updated EKG and divalproex  levels EKG or While pt on Qtc prolonging medications, please monitor & replete K+ to 4 and Mg2+ to 2 -- most recent EKG on 01/20/2024 had QtC of 440 -- Pertinent labwork reviewed earlier this admission includes: CMP, CBC, and UDS (negative)  ## Disposition:-- We recommend inpatient psychiatric hospitalization when medically cleared. Patient is under voluntary admission status at this time; please IVC if attempts to leave hospital.  ## Behavioral / Environmental: -Utilize compassion and acknowledge the patient's experiences while setting clear and realistic expectations for care.    ## Safety and Observation Level:  - Based on my clinical evaluation, I estimate the patient to be at low risk of self harm in the current setting. - At this time, we recommend  routine. This decision is based on my review of the chart including patient's history and current presentation, interview of the patient, mental status examination, and consideration of suicide risk including evaluating suicidal ideation, plan, intent, suicidal or self-harm behaviors, risk factors, and protective factors. This judgment is based on our ability to directly address suicide risk, implement suicide prevention strategies, and develop a safety plan while the patient is in the clinical setting. Please contact our team if there is a concern that risk level has changed.  CSSR Risk Category:C-SSRS RISK CATEGORY: Low Risk  Suicide Risk Assessment: Patient has following modifiable risk factors for suicide: recklessness, medication noncompliance, active mental illness (to encompass adhd, tbi, mania, psychosis, trauma reaction), and recent psychiatric hospitalization, which we are addressing by referral for psych  admission for safety monitoring and med adjustments. Patient has following non-modifiable or demographic risk factors for suicide: male gender and psychiatric hospitalization Patient has the following protective factors against suicide: Access to outpatient mental health care  Thank you for this consult request. Recommendations have been communicated to the primary team.  We will sign at this time.   Bernadette FORBES Barefoot, NP       History of Present Illness  Relevant Aspects of Hospital ED Course:  Admitted on 06/14/2024 for Per RN Triage note dated 06/14/2024@0458 : Patient reports he is having some hallucinations. States he has a brain aneurysm and is being controlled by a computer program called mom. Endorses SI but does not give any details. Denies SI. He currently using his two fingers to keep his left eye open because he states it needs air.   Patient Report:  Patient observed laying in bed, bald head white male, with increased body habitus.  He's fairly groomed wearing hospital scrubs. He is not ill-appearing and not his appearance is not disheveled.  Patient greeted and given anticipatory guidance.  He is alert to self and aware he's in the hospital.  When asked his primary reason for the emergency room visit, he reports, my brain is going to explode. He reports he's been hearing voices saying, I'm gonna give you a brain aneurysm and I'm gonna make you commit suicide.  He tries to explain a story in which he used a Lobbyist called mom that told him to lock the doors and stay in his room.  He states he decided to come to the hospital instead.  Per chart review,  MOM is a AI generated platform that patient previously reported provides sexual sensations and communicates with him in public places, such as [place].  It's unclear if is symptoms began after using the app and he had difficulty naming other inciting factors.  He reports the thoughts are tormenting and have become worse  over the past month.  He reports the voices are bad and makes him feel his head is going to erupt.  He also states the voices are chronic, in the past they have improved with medication but have never gone away. He tells me today, he wants the voices to improve. He has a hx of medication non-compliance but reports currently states he's taking medication as prescribed.  He has a hx for polysubstance abuse (alcohol and cannabis) but he denies recent usage, his admitting UDS is negative.     His presentation is bizarre in that he keeps pointing to his head and referencing having an aneurysm; although he denies recent prior concerns or hx for head trauma, or headaches. He reports taking the following medications:    Depakote  750mg  BID Seroquel  400mg  po at bedtime He reports taking medications as prescribed, last dose was yesterday 8/16.    Patient reports being following by Dr. Jess for [facility], last visit was one week ago; last month he reports his Seroquel  was increased to 400mg  po at bedtime d/t AH; he states he was told not to go above 400mg  unless he was in a hospital setting.   Psych ROS:  Depression: yes Anxiety:  yes Mania (lifetime and current): yes Psychosis: (lifetime and current): past and present, hallucinations  Collateral information:  Deferred as patient is a good historian  Review of Systems  Constitutional: Negative.   HENT: Negative.    Eyes: Negative.   Respiratory: Negative.    Cardiovascular: Negative.   Gastrointestinal: Negative.   Genitourinary: Negative.   Musculoskeletal: Negative.   Skin: Negative.   Neurological: Negative.   Endo/Heme/Allergies: Negative.   Psychiatric/Behavioral:  Positive for depression and hallucinations. The patient is nervous/anxious.      Psychiatric and Social History  Psychiatric History:  Information collected from patient and chart review  Prev Dx/Sx: alcoholism, schizoaffective disorder, bipolar type,  cannabis use disorder, paranoid schizophrenia, adjustment disorder with disturbance of emotions.  Current Psych Provider: Dr. Jess Home Meds (current):  Depakote  750mg  po BID Seroquel  400mg  po at bedtime  Previous Med Trials: as taken as much as Seroquel  800mg  daily but historically was told he could only do this in hospital setting d/t unclear throat concerns, he denies allergies or swelling Therapy: yes   Prior Psych Hospitalization: yes, 2 within the last 3 months  Prior Self Harm: he denies Prior Violence: he denies  Family Psych History:  Family Hx suicide: he denies  Social History: pt mentally decompensated and has difficulty remaining on task and answering questions.  Developmental Hx: deferred Educational Hx: deferred Occupational Hx: unemployed Legal Hx: unknown Living Situation: he lives alone; his mom and step father live in [place] and are supportive of him.  Spiritual Hx: unknown Access to weapons/lethal means: he denies   Substance History Alcohol: hx of alcohol abuse but he currently denies use  Type of alcohol he cannot remember Tobacco: denies Illicit drugs: hx of marijuana use but he currently denies Prescription drug abuse: denies Rehab hx: denies  Exam Findings  Physical Exam:  Vital Signs:  Temp:  [97.8 F (36.6 C)] 97.8 F (36.6 C) (08/17 1056) Pulse  Rate:  [72-78] 72 (08/17 1056) Resp:  [16-18] 18 (08/17 1056) BP: (113-130)/(73-87) 113/73 (08/17 1056) SpO2:  [95 %-96 %] 96 % (08/17 1056) Blood pressure 113/73, pulse 72, temperature 97.8 F (36.6 C), temperature source Oral, resp. rate 18, SpO2 96%. There is no height or weight on file to calculate BMI.  Physical Exam Vitals and nursing note reviewed.  Cardiovascular:     Rate and Rhythm: Normal rate.     Pulses: Normal pulses.  Musculoskeletal:        General: Normal range of motion.     Cervical back: Normal range of motion.  Neurological:     Mental Status: He is alert. He is  disoriented.  Psychiatric:        Attention and Perception: He perceives auditory hallucinations.        Mood and Affect: Mood is anxious and depressed. Affect is blunt.        Speech: Speech normal.        Behavior: Behavior is slowed and actively hallucinating. Behavior is cooperative.        Thought Content: Thought content is paranoid and delusional. Thought content does not include homicidal or suicidal ideation. Thought content does not include homicidal or suicidal plan.        Cognition and Memory: Cognition is impaired.        Judgment: Judgment is impulsive.     Mental Status Exam: General Appearance: Bizarre and Fairly Groomed  Orientation:  Other:  alert and oriented to self and location only  Memory:  Immediate;   Fair Recent;   Fair Remote;   Fair  Concentration:  Concentration: Poor and Attention Span: Poor  Recall:  Fair  Attention  Poor  Eye Contact:  Fair  Speech:  Slow  Language:  Good  Volume:  Decreased  Mood: I'm scared  Affect:  Blunt, Congruent, and Depressed  Thought Process:  Linear  Thought Content:  Illogical and Hallucinations: hearing voices telling him he's going to die; he denies command hallucinations  Suicidal Thoughts:  No  Homicidal Thoughts:  No  Judgement:  Poor  Insight:  Lacking  Psychomotor Activity:  Normal  Akathisia:  No  Fund of Knowledge:  Fair      Assets:  Arch[employer]  Cognition:  Impaired,  Moderate  ADL's:  Intact  AIMS (if indicated):        Other History   These have been pulled in through the EMR, reviewed, and updated if appropriate.  Family History:  The patient's family history includes Depression in his maternal aunt; Thyroid  disease in his mother.  Medical History: Past Medical History:  Diagnosis Date   Alcoholism (HCC)    Bipolar disorder (HCC)    Drug abuse (HCC)    xanax addiction 5 years ago   Heartburn    Medical non-compliance    Paranoid  schizophrenia (HCC)    Schizophrenia (HCC)    Seizures (HCC)     Surgical History: Past Surgical History:  Procedure Laterality Date   NO PAST SURGERIES       Medications:  No current facility-administered medications for this encounter.  Current Outpatient Medications:    divalproex  (DEPAKOTE ) 250 MG DR tablet, Take 3 tablets (750 mg total) by mouth 2 (two) times daily., Disp: 60 tablet, Rfl: 0   omeprazole (PRILOSEC) 40 MG capsule, Take 40 mg by mouth daily as needed (for heartburn or reflux)., Disp: , Rfl:    QUEtiapine  (SEROQUEL ) 400 MG  tablet, Take 400 mg by mouth at bedtime., Disp: , Rfl:   Allergies: Allergies  Allergen Reactions   Chicken Allergy Other (See Comments)    Pt reports he doesn't eat chicken   Chlorpromazine Other (See Comments)    Makes pt feel suicidal **Thorazine**    Bernadette FORBES Barefoot, NP

## 2024-06-14 NOTE — ED Notes (Signed)
 Attempted to call [facility] x3 no answer.

## 2024-06-14 NOTE — ED Notes (Signed)
 Safety transport called for patient. Belonging obtained from security.

## 2024-06-14 NOTE — ED Notes (Signed)
 Patient hollering and very agitated, redirection unsuccessful. MD made aware.

## 2024-06-14 NOTE — ED Provider Notes (Signed)
 [facility] AT [facility] Provider Note   CSN: [id number] Arrival date & time: 06/14/24  0434     Patient presents with: Hallucinations   Albert [name] is a 48 y.o. male with PMHx bipolar disorder, GERD, paranoid schizophrenia, seizures who presents to ED concerned for hallucinations. Patient stating that he is on medication but did not take it today. Patient stating that his home medications never fully control his hallucinations, but they have been getting worse recently. Patient talking about a microchip in his brain with voices telling him to harm himself - patient is otherwise not able to provide any more information. When asking patient about recent infectious symptoms, he starts talking about the microchip in his brain again.    HPI     Prior to Admission medications   Medication Sig Start Date End Date Taking? Authorizing Provider  divalproex  (DEPAKOTE ) 250 MG DR tablet Take 3 tablets (750 mg total) by mouth 2 (two) times daily. 01/28/24  Yes Jadapalle, Sree, MD  omeprazole (PRILOSEC) 40 MG capsule Take 40 mg by mouth daily as needed (for heartburn or reflux).   Yes [provider]  QUEtiapine  (SEROQUEL ) 400 MG tablet Take 400 mg by mouth at bedtime. 06/04/24  Yes [provider]    Allergies: Chicken allergy and Chlorpromazine    Review of Systems  Psychiatric/Behavioral:  Positive for hallucinations.     Updated Vital Signs BP 130/87 (BP Location: Right Arm)   Pulse 78   Temp 97.8 F (36.6 C) (Oral)   Resp 16   SpO2 95%   Physical Exam Vitals and nursing note reviewed.  Constitutional:      General: He is not in acute distress.    Appearance: He is not ill-appearing or toxic-appearing.  HENT:     Head: Normocephalic and atraumatic.     Mouth/Throat:     Mouth: Mucous membranes are moist.  Eyes:     General: No scleral icterus.       Right eye: No discharge.        Left eye: No discharge.      Conjunctiva/sclera: Conjunctivae normal.  Cardiovascular:     Rate and Rhythm: Normal rate and regular rhythm.     Pulses: Normal pulses.     Heart sounds: Normal heart sounds. No murmur heard. Pulmonary:     Effort: Pulmonary effort is normal. No respiratory distress.     Breath sounds: Normal breath sounds. No wheezing, rhonchi or rales.  Abdominal:     General: Abdomen is flat. Bowel sounds are normal. There is no distension.     Palpations: Abdomen is soft. There is no mass.     Tenderness: There is no abdominal tenderness.  Musculoskeletal:     Right lower leg: No edema.     Left lower leg: No edema.  Skin:    General: Skin is warm and dry.     Findings: No rash.  Neurological:     General: No focal deficit present.     Mental Status: He is alert. Mental status is at baseline.     Comments: GCS 15. Speech is goal oriented. No deficits appreciated to CN III-XII. Patient has equal grip strength bilaterally with 5/5 strength against resistance in all major muscle groups bilaterally. Sensation to light touch intact. Patient moves extremities without ataxia.   Psychiatric:        Mood and Affect: Mood normal.     (all labs ordered are listed, but only  abnormal results are displayed) Labs Reviewed  COMPREHENSIVE METABOLIC PANEL WITH GFR - Abnormal; Notable for the following components:      Result Value   Glucose, Bld 101 (*)    All other components within normal limits  ETHANOL  CBC  RAPID URINE DRUG SCREEN, HOSP PERFORMED    EKG: None  Radiology: No results found.   Procedures   Medications Ordered in the ED - No data to display                                  Medical Decision Making Amount and/or Complexity of Data Reviewed Labs: ordered.   This patient presents to the ED for psych evaluation, this involves an extensive number of treatment options, and is a complaint that carries with it a high risk of complications and morbidity.  The differential  diagnosis includes primary psychosis, substance-induced psychosis, mood disturbance, SI/HI.   Co morbidities that complicate the patient evaluation  bipolar disorder, GERD, paranoid schizophrenia, seizures   Additional history obtained:  Dr. Shelda   Lab Tests:  I Ordered, and personally interpreted labs.  The pertinent results include:   - UDS: Negative - EtOH: Negative - CBC: No leukocytosis or anemia - CMP: Mild hyperglycemia at 101   Problem List / ED Course / Critical interventions / Medication management  Patient presented for psychiatric evaluation. Patient is endorsing hallucinations and SI stating that the voices are telling him to hurt himself. On my initial exam, the pt was overall well-appearing. Vital signs reviewed and reassuring. With the patient's presentation of hallucinations and SI, patient warrants emergent psychiatric consultation.  Patient placed into ED psychiatric hold protocol including suicide precautions, elopement precautions and vital sign monitoring. TTS consulted for further evaluation once patient medically cleared. Medical screening evaluation ordered and reviewed with no obvious medical reason to postpone psychiatric evaluation. Patient is voluntary at this time. No IVC. May need to be reassessed if capacity is changing.  I have reviewed the patients home medicines and have made adjustments as needed 7:30AM: Patient now medically cleared for psych dispo.   Social Determinants of Health:  schizophrenia       Final diagnoses:  Hallucinations    ED Discharge Orders     None          Hoy Nidia FALCON, [place] 06/14/24 9261    Laurice Maude BROCKS, MD 06/14/24 [phone]

## 2024-06-15 ENCOUNTER — Encounter (HOSPITAL_COMMUNITY): Payer: Self-pay | Admitting: Adult Health

## 2024-06-15 ENCOUNTER — Other Ambulatory Visit: Payer: Self-pay

## 2024-06-15 MED ORDER — DIPHENHYDRAMINE HCL 25 MG PO CAPS
25.0000 mg | ORAL_CAPSULE | Freq: Every day | ORAL | Status: DC
  Administered 2024-06-15 – 2024-06-20 (×6): 25 mg via ORAL
  Filled 2024-06-15 (×6): qty 1

## 2024-06-15 NOTE — Plan of Care (Addendum)
 Pt reports he slept well last night with good appetite. Denies SI, HI,A VH and pain this shift. Observed to be animated, anxious but forwards on interactions. However, he's observed to be preoccupied, wide eyed, pacing hall noted starring at ceiling in dayroom, starring at [employer] as if responding to internal stimuli; and standing at unit entry door whispering to self at intervals. He remains redirectable at this time. Tolerates fluids, meals and medications well. Safety checks maintained at Q 15 minutes intervals. Continued support, encouragement and reassurance offered.   Problem: Activity: Goal: Interest or engagement in activities will improve Outcome: Progressing   Problem: Coping: Goal: Ability to demonstrate self-control will improve Outcome: Progressing

## 2024-06-15 NOTE — Progress Notes (Signed)
   06/15/24 2300  Psych Admission Type (Psych Patients Only)  Admission Status Voluntary  Psychosocial Assessment  Patient Complaints Anxiety  Eye Contact Staring  Facial Expression Wide-eyed  Affect Appropriate to circumstance  Speech Tangential  Interaction Assertive  Motor Activity Fidgety;Pacing  Appearance/Hygiene Disheveled  Behavior Characteristics Cooperative  Mood Preoccupied  Aggressive Behavior  Effect No apparent injury  Thought Process  Coherency Disorganized  Content Preoccupation  Delusions Paranoid  Perception Hallucinations  Hallucination Auditory  Judgment Poor  Confusion Mild  Danger to Self  Current suicidal ideation? Denies  Danger to Others  Danger to Others None reported or observed
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
## 2024-06-15 NOTE — Progress Notes (Signed)
 Patient ID: Albert [name], male   DOB: June 10, 1976, 48 y.o.   MRN: [id number]  2150 Pt ambulated to the Unit. Pt is A/O x4 with noted Schizophrenia/ Schizoaffective Disorder/Depression/ ETOH/ Polysubstance & Anxiety with medical Hx: Seizures. Pt able to verbalize needs. Meds whole. Food/Fluids. 100% meal intake. ADL's self. Skin intake with no noted issues. Pt began screaming and hitting his head during admission and unable to respond to the questions. Pt's mood: Anxious. Pt denied SI/HI; V/H but confirmed A/H. Pt kept repeating while hitting his head saying; I'm having an aneurysm. It was reported that the Pt has a chip in his head. Pt is able to be re-directed. Scheduled meds given as ordered. No noted distress. Pt slept 6 hrs. Passing on care to On-Coming Nurse.

## 2024-06-15 NOTE — Group Note (Signed)
 Date:  06/15/2024 Time:  8:39 PM  Group Topic/Focus:  Wrap-Up Group:   The focus of this group is to help patients review their daily goal of treatment and discuss progress on daily workbooks.    Participation Level:  Active  Participation Quality:  Appropriate  Affect:  Excited  Cognitive:  Appropriate  Insight: Appropriate  Engagement in Group:  Engaged  Modes of Intervention:  Discussion  Additional Comments:   Pt states that he's had a good first full day and was able to get good information from his doctors and csw today. Pt attended groups, meals, and recreation time. Pt endorsed anxiety at a 3 and depression at a 2  Albert [name] A Albert [name] 06/15/2024, 8:39 PM

## 2024-06-15 NOTE — [facility] Suicide Risk Assessment (Signed)
 [facility] INPATIENT:  Family/Significant Other Suicide Prevention Education  Suicide Prevention Education:  Patient Refusal for Family/Significant Other Suicide Prevention Education: The patient Albert [name] has refused to provide written consent for family/significant other to be provided Family/Significant Other Suicide Prevention Education during admission and/or prior to discharge.  Physician notified.   Elycia Woodside O Brazos Sandoval, LCSWA 06/15/2024, 5:45 PM

## 2024-06-15 NOTE — Group Note (Signed)
 LCSW Group Therapy Note   Group Date: 06/15/2024 Start Time: 1300 End Time: 1400   Participation:  patient was present.  He listened and was respectful.  At times he responded to internal stimuli.    Type of Therapy:  Group Therapy  Title:  Speaking from the Heart: Communicating with Understanding and Empathy  Objective:  To help participants develop effective communication skills to express themselves clearly, listen actively, and navigate conflicts in a healthy way.  Goals: Increase awareness of verbal and non-verbal communication skills. Practice using "I" statements and active listening techniques. Learn coping strategies for managing communication stress.  Summary:  Participants explored the importance of communication, discussed challenges, and practiced skills such as active listening and assertive expression. They reflected on past experiences and identified ways to improve communication in their daily lives.  Therapeutic Modalities: Cognitive-Behavioral Therapy (CBT): Restructuring negative thought patterns in communication. Mindfulness: Staying present and calm during conversations. Psychoeducation: Learning about effective communication techniques.   Albert [name], LCSWA 06/15/2024  5:59 PM
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
## 2024-06-15 NOTE — Plan of Care (Signed)

## 2024-06-15 NOTE — Tx Team (Signed)
 Initial Treatment Plan 06/15/2024 4:31 AM Albert [name] FMW:[id number]    PATIENT STRESSORS: Substance abuse     PATIENT STRENGTHS: Other: Unable to assess.    PATIENT IDENTIFIED PROBLEMS: Pt unable to respond. Continues to scream and hitting his head during admission.                     DISCHARGE CRITERIA:  Ability to meet basic life and health needs  PRELIMINARY DISCHARGE PLAN: Not able to assess.  PATIENT/FAMILY INVOLVEMENT: This treatment plan has been presented to and reviewed with the patient, [name].  The patient has been given the opportunity to ask questions and make suggestions.  Bronwyn ONEIDA Sharps, RN 06/14/24, 09:55 PM

## 2024-06-15 NOTE — Progress Notes (Signed)
 Conversation with patient:  Patient said that he often goes to hospitals.  CSW offered to send a referral for ACTT services, but he declined explaining that he would like to continue seeing his current psychiatrist.    Tavion Senkbeil, LCSWA 06/15/2024

## 2024-06-15 NOTE — BH Assessment (Signed)
(  Sleep Hours) - 6 (Any PRNs that were needed, meds refused, or side effects to meds)- None (Any disturbances and when (visitation, over night)- Pt refused to complete admission (Concerns raised by the patient)- Psychosis Pt said; I'm having a aneurysm. (SI/HI/AVH)- Denies

## 2024-06-15 NOTE — Progress Notes (Signed)
   06/14/24 2150  Psych Admission Type (Psych Patients Only)  Admission Status Voluntary  Psychosocial Assessment  Patient Complaints Substance abuse  Eye Contact Fair  Facial Expression Anxious  Affect Anxious  Speech Incoherent  Interaction Guarded  Motor Activity Other (Comment) (WNL)  Appearance/Hygiene Disheveled;In scrubs  Behavior Characteristics Restless;Guarded  Mood Irritable  Thought Process  Coherency Disorganized  Content Preoccupation  Delusions Paranoid  Perception UTA  Hallucination UTA  Judgment Poor  Confusion UTA  Danger to Self  Current suicidal ideation?  (Denies)  Agreement Not to Harm Self Yes  Description of Agreement Notify Staff  Danger to Others  Danger to Others None reported or observed

## 2024-06-16 LAB — VALPROIC ACID LEVEL: Valproic Acid Lvl: 74 ug/mL (ref 50–100)

## 2024-06-16 NOTE — Plan of Care (Signed)
  Problem: Activity: Goal: Interest or engagement in activities will improve Outcome: Progressing   Problem: Coping: Goal: Ability to verbalize frustrations and anger appropriately will improve Outcome: Progressing   Problem: Safety: Goal: Periods of time without injury will increase Outcome: Progressing   Problem: Education: Goal: Knowledge of [facility] Education information/materials will improve Outcome: Not Progressing

## 2024-06-16 NOTE — Progress Notes (Signed)
 Conversation with patient:  Patient said that he doesn't have guns or weapons, and doesn't have access to any guns or weapons.   Karima Carrell, LCSWA 06/16/2024
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
## 2024-06-16 NOTE — Progress Notes (Signed)
   06/16/24 2301  Psychosocial Assessment  Patient Complaints None  Eye Contact Fair  Facial Expression Animated  Affect Appropriate to circumstance  Speech Tangential  Interaction Assertive  Motor Activity Pacing;Fidgety  Appearance/Hygiene Unremarkable  Behavior Characteristics Appropriate to situation  Mood Pleasant  Thought Process  Coherency Disorganized  Content Preoccupation  Delusions Paranoid  Perception Hallucinations  Hallucination Auditory  Judgment Poor  Confusion Mild  Danger to Self  Current suicidal ideation? Denies  Agreement Not to Harm Self Yes  Description of Agreement verbal  Danger to Others  Danger to Others None reported or observed

## 2024-06-16 NOTE — Hospital Course (Signed)
 8/19 - Remains psychotic. Diphenhydramine  seems to help. Plan to discuss his history with Dr. Jess.

## 2024-06-16 NOTE — Plan of Care (Signed)
  Problem: Education: Goal: Emotional status will improve Outcome: Progressing   Problem: Activity: Goal: Sleeping patterns will improve Outcome: Progressing   Problem: Physical Regulation: Goal: Ability to maintain clinical measurements within normal limits will improve Outcome: Progressing

## 2024-06-16 NOTE — Progress Notes (Signed)
(  Sleep Hours) -7 (Any PRNs that were needed, meds refused, or side effects to meds)- none (Any disturbances and when (visitation, over night)-none (Concerns raised by the patient)- none (SI/HI/AVH)-endorses AVH

## 2024-06-17 ENCOUNTER — Encounter (HOSPITAL_COMMUNITY): Payer: Self-pay

## 2024-06-17 NOTE — Group Note (Signed)
 Date:  06/17/2024 Time:  8:23 PM  Group Topic/Focus:  Conflict Resolution:   The focus of this group is to discuss the conflict resolution process and how it may be used upon discharge.    Participation Level:  Minimal  Participation Quality:  Drowsy  Affect:  Excited  Cognitive:  Oriented  Insight: Good  Engagement in Group:  Supportive  Modes of Intervention:  Education  Additional Comments:     Albert [name] 06/17/2024, 8:23 PM

## 2024-06-17 NOTE — Progress Notes (Signed)
   06/17/24 0757  Psych Admission Type (Psych Patients Only)  Admission Status Voluntary  Psychosocial Assessment  Patient Complaints Suspiciousness  Eye Contact Fair  Facial Expression Animated  Affect Anxious  Speech Tangential  Interaction Assertive  Motor Activity Fidgety  Appearance/Hygiene Unremarkable  Behavior Characteristics Agitated  Mood Suspicious  Thought Process  Coherency Disorganized  Content Preoccupation  Delusions Paranoid  Perception Hallucinations  Hallucination Auditory  Judgment Poor  Confusion None  Danger to Self  Current suicidal ideation? Denies  Agreement Not to Harm Self Yes  Description of Agreement verbal  Danger to Others  Danger to Others None reported or observed
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
## 2024-06-17 NOTE — Progress Notes (Signed)
(  Sleep Hours) -6.25 (Any PRNs that were needed, meds refused, or side effects to meds)- none (Any disturbances and when (visitation, over night)-none (Concerns raised by the patient)- none (SI/HI/AVH)- Denies all
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
## 2024-06-17 NOTE — BH IP Treatment Plan (Signed)
 Interdisciplinary Treatment and Diagnostic Plan Update  06/17/2024 Time of Session: 1150 Albert [name] MRN: [id number]  Principal Diagnosis: Paranoid schizophrenia [facility])  Secondary Diagnoses: Principal Problem:   Paranoid schizophrenia (HCC)   Current Medications:  Current Facility-Administered Medications  Medication Dose Route Frequency Provider Last Rate Last Admin   acetaminophen  (TYLENOL ) tablet 650 mg  650 mg Oral Q6H PRN Mills, Shnese E, NP       alum & mag hydroxide-simeth (MAALOX/MYLANTA) 200-200-20 MG/5ML suspension 30 mL  30 mL Oral Q4H PRN Mills, Shnese E, NP       diphenhydrAMINE  (BENADRYL ) capsule 25 mg  25 mg Oral QHS Coren Sagan A, DO   25 mg at 06/16/24 2037   haloperidol  (HALDOL ) tablet 5 mg  5 mg Oral TID PRN Moishe Bernadette BRAVO, NP       And   diphenhydrAMINE  (BENADRYL ) capsule 50 mg  50 mg Oral TID PRN Mills, Shnese E, NP       haloperidol  lactate (HALDOL ) injection 5 mg  5 mg Intramuscular TID PRN Moishe Bernadette BRAVO, NP       And   diphenhydrAMINE  (BENADRYL ) injection 50 mg  50 mg Intramuscular TID PRN Moishe Bernadette BRAVO, NP       And   LORazepam  (ATIVAN ) injection 2 mg  2 mg Intramuscular TID PRN Mills, Shnese E, NP       haloperidol  lactate (HALDOL ) injection 10 mg  10 mg Intramuscular TID PRN Moishe Bernadette BRAVO, NP       And   diphenhydrAMINE  (BENADRYL ) injection 50 mg  50 mg Intramuscular TID PRN Moishe Bernadette BRAVO, NP       And   LORazepam  (ATIVAN ) injection 2 mg  2 mg Intramuscular TID PRN Mills, Shnese E, NP       divalproex  (DEPAKOTE ) DR tablet 750 mg  750 mg Oral BID Mills, Shnese E, NP   750 mg at 06/16/24 2037   magnesium  hydroxide (MILK OF MAGNESIA) suspension 30 mL  30 mL Oral Daily PRN Mills, Shnese E, NP       pantoprazole  (PROTONIX ) EC tablet 80 mg  80 mg Oral Daily Mills, Shnese E, NP   80 mg at 06/17/24 0757   QUEtiapine  (SEROQUEL ) tablet 400 mg  400 mg Oral QHS Mills, Shnese E, NP   400 mg at 06/16/24 2038   PTA Medications: Medications Prior to  Admission  Medication Sig Dispense Refill Last Dose/Taking   divalproex  (DEPAKOTE ) 250 MG DR tablet Take 3 tablets (750 mg total) by mouth 2 (two) times daily. 60 tablet 0    omeprazole (PRILOSEC) 40 MG capsule Take 40 mg by mouth daily as needed (for heartburn or reflux).      QUEtiapine  (SEROQUEL ) 400 MG tablet Take 400 mg by mouth at bedtime.       Patient Stressors: Substance abuse    Patient Strengths: Other: Unable to assess.   Treatment Modalities: Medication Management, Group therapy, Case management,  1 to 1 session with clinician, Psychoeducation, Recreational therapy.   Physician Treatment Plan for Primary Diagnosis: Paranoid schizophrenia (HCC) Long Term Goal(s): Improvement in symptoms so as ready for discharge   Short Term Goals:    Medication Management: Evaluate patient's response, side effects, and tolerance of medication regimen.  Therapeutic Interventions: 1 to 1 sessions, Unit Group sessions and Medication administration.  Evaluation of Outcomes: Progressing  Physician Treatment Plan for Secondary Diagnosis: Principal Problem:   Paranoid schizophrenia (HCC)  Long Term Goal(s): Improvement in symptoms so as ready for  discharge   Short Term Goals:       Medication Management: Evaluate patient's response, side effects, and tolerance of medication regimen.  Therapeutic Interventions: 1 to 1 sessions, Unit Group sessions and Medication administration.  Evaluation of Outcomes: Progressing   RN Treatment Plan for Primary Diagnosis: Paranoid schizophrenia (HCC) Long Term Goal(s): Knowledge of disease and therapeutic regimen to maintain health will improve  Short Term Goals: Ability to demonstrate self-control, Ability to participate in decision making will improve, Ability to disclose and discuss suicidal ideas, and Ability to identify and develop effective coping behaviors will improve  Medication Management: RN will administer medications as ordered by  provider, will assess and evaluate patient's response and provide education to patient for prescribed medication. RN will report any adverse and/or side effects to prescribing provider.  Therapeutic Interventions: 1 on 1 counseling sessions, Psychoeducation, Medication administration, Evaluate responses to treatment, Monitor vital signs and CBGs as ordered, Perform/monitor CIWA, COWS, AIMS and Fall Risk screenings as ordered, Perform wound care treatments as ordered.  Evaluation of Outcomes: Progressing   LCSW Treatment Plan for Primary Diagnosis: Paranoid schizophrenia (HCC) Long Term Goal(s): Safe transition to appropriate next level of care at discharge, Engage patient in therapeutic group addressing interpersonal concerns.  Short Term Goals: Engage patient in aftercare planning with referrals and resources, Increase emotional regulation, Facilitate acceptance of mental health diagnosis and concerns, Identify triggers associated with mental health/substance abuse issues, and Increase skills for wellness and recovery  Therapeutic Interventions: Assess for all discharge needs, 1 to 1 time with Social worker, Explore available resources and support systems, Assess for adequacy in community support network, Educate family and significant other(s) on suicide prevention, Complete Psychosocial Assessment, Interpersonal group therapy.  Evaluation of Outcomes: Progressing   Progress in Treatment: Attending groups: Yes. Participating in groups: Yes. Taking medication as prescribed: Yes. Toleration medication: Yes. Family/Significant other contact made: No contact given on initial assessment Patient understands diagnosis: No. Discussing patient identified problems/goals with staff: Yes. Medical problems stabilized or resolved: Yes. Denies suicidal/homicidal ideation: No. Issues/concerns per patient self-inventory: No.   Patient Goals:  Stabilize on medications, decrease voices, resolve  SI  Discharge Plan or Barriers: Return home with outpatient follow-up.  Reason for Continuation of Hospitalization: Hallucinations  Estimated Length of Stay: 5-10 days  Last 3 [place] Suicide Severity Risk Score: Flowsheet Row [facility]Current[facility] Most recent reading at 06/14/2024  9:50 PM ED from 06/14/2024 in [facility] Department at [facility] Most recent reading at 06/14/2024  5:02 AM ED from 06/13/2024 in [facility] Department at [facility] Most recent reading at 06/13/2024 11:46 AM  C-SSRS RISK CATEGORY Low Risk Low Risk No Risk    Last PHQ 2/9 Scores:     No data to display          Scribe for Treatment Team: Oliva DELENA Salmon, DO 06/17/2024 8:31 AM

## 2024-06-17 NOTE — Progress Notes (Signed)
 Pt witnessed taking his shirt off in the hallway asking a male pt if she could shower with him. Pt redirected back to his room. PO administered.

## 2024-06-18 MED ORDER — HALOPERIDOL 5 MG PO TABS
5.0000 mg | ORAL_TABLET | Freq: Three times a day (TID) | ORAL | Status: DC | PRN
  Administered 2024-06-18: 5 mg via ORAL

## 2024-06-18 NOTE — Group Note (Signed)
 Occupational Therapy Group Note  Group Topic: Sleep Hygiene  Group Date: 06/18/2024 Start Time: 1530 End Time: 1550 Facilitators: Dot [place] MATSU, OT   Group Description: Group encouraged increased participation and engagement through topic focused on sleep hygiene. Patients reflected on the quality of sleep they typically receive and identified areas that need improvement. Group was given background information on sleep and sleep hygiene, including common sleep disorders. Group members also received information on how to improve one's sleep and introduced a sleep diary as a tool that can be utilized to track sleep quality over a length of time. Group session ended with patients identifying one or more strategies they could utilize or implement into their sleep routine in order to improve overall sleep quality.        Therapeutic Goal(s):  Identify one or more strategies to improve overall sleep hygiene  Identify one or more areas of sleep that are negatively impacted (sleep too much, too little, etc)     Participation Level: Engaged   Participation Quality: Moderate Cues   Behavior: Cooperative and Distracted   Speech/Thought Process: Loose association    Affect/Mood: Flat   Insight: Limited   Judgement: Limited      Modes of Intervention: Education  Patient Response to Interventions:  Disengaged   Plan: Continue to engage patient in OT groups 2 - 3x/week.  06/18/2024  [place] MATSU Dot, OT   Albert [name], OT

## 2024-06-18 NOTE — Plan of Care (Signed)
   Problem: Education: Goal: Emotional status will improve Outcome: Not Progressing Goal: Mental status will improve Outcome: Not Progressing

## 2024-06-18 NOTE — BHH Group Notes (Signed)
 Psychoeducational Group Note  Date:  06/18/2024 Time:  2058  Group Topic/Focus:  Wrap-Up Group:   The focus of this group is to help patients review their daily goal of treatment and discuss progress on daily workbooks.  Participation Level: Did Not Attend  Participation Quality:  Not Applicable  Affect:  Not Applicable  Cognitive:  Not Applicable  Insight:  Not Applicable  Engagement in Group: Not Applicable  Additional Comments:  Patient did not attend the evening group.   Aniko Finnigan S 06/18/2024, 8:58 PM

## 2024-06-18 NOTE — Progress Notes (Signed)
 [facility] Inpatient Psychiatry Progress Note  Date: 06/18/24 Patient: Albert [name] MRN: [id number]  Assessment and Plan: [name] is a 48 y.o. male male with a history of previously diagnosed schizophrenia and multiple psychiatric inpatient admissions who was admitted to [facility] for paranoid delusions and hallucinations with the specific concern that hallucinations and suicidal thoughts would trigger an aneurysm in his brain to rupture.   8/19 - Continues to be grossly psychotic but med compliant. Depakote  level WNL so he has been compliant with meds.   8/20 - Patient had a verbal outburst this morning towards a staff member due to mis attributed AH.   8/21 - Another episode of disorganized/psychotic behaviors last night. Patient reports some improvement in anxious/paranoid thoughts with PRN haloperidol  so will order this.   # Paranoid schizophrenia (HCC)  - Unclear if he also has BPAD. Reports also having an autism spectrum disorder and encourages communication with his outpatient psychiatrist regarding his treatment.  - Continue depakote  DR 750 mg BID. Level 75 on 8/19. - Quetiapine  400 mg at bedtime  - Diphenhydramine  25 mg at bedtime for suspected EPS. - Left message with Dr. Reddy's office at [facility].  - Haldol  5 mg q8h prn anxiety/agitation/psychotic behavior    Risk Assessment - Patient is overtly psychotic and gravely disabled. He does not appear to be actively suicidal  Discharge Planning Barriers to discharge: Active psychosis Estimated length of stay: 5-7 days Predicted Discharge location: Home    Interval History: Chart reviewed. Last night patient was reported by staff to have been endorsing anxiety and racing paranoid thoughts. He received prn haldol  5 mg then later in the evening he took his shirt off in the hallway and invited a male patient from across the hallway to join him for a shower. He  received another 5 mg prn dose of Haldol . Today he reports that the PRN Haldol  was helping in decreasing his anxiety and racing thoughts. He reported sleeping well and feeling less anxiety today. In discussing the incident last night he was mildly dismissive of it and stated that he was only trying to make a joke. He reported ongoing anxiety however, specifically about my thoughts in general and getting out. He expressed concern over her ability to survive outside the hospital. He is still resistant to increasing quetiapine . He reporting ongoing AH but reduced. He specifically inquired about participating in an IOP program for his symptoms and to socialize.      Physical Exam MSK/Neuro - Normal gait and station. Occasional inspiratory nasal tics Mental Status Exam Appearance - Wearing scrubs, appropriate hygiene Attitude - Polite, but hesitant Speech - soft and monotonous, normal prosody Mood - Anxious Affect - Odd and restricted Thought Process - Tangential to disorganized, illogical Thought Content - Believes that a unit tech was mocking him this morning SI/HI - denies today Perceptions - Ongoing AH a tiny bit, RIS at one point during assessment Judgement/Insight - Impaired Fund of knowledge - WNL Language - No impairments      Lab Results:  Admission on 06/14/2024  Component Date Value Ref Range Status   Valproic Acid  Lvl 06/16/2024 74  50 - 100 ug/mL Final     Vitals: Blood pressure 139/80, pulse 92, temperature 98.6 F (37 C), resp. rate 16, height 5' 10 (1.778 m), weight 104.8 kg, SpO2 96%.    Oliva DELENA Salmon, DO

## 2024-06-18 NOTE — Progress Notes (Signed)
(  Sleep Hours) - 9.75 (Any PRNs that were needed, meds refused, or side effects to meds)- haldol  / benadryl  (twice) (Any disturbances and when (visitation, over night)- none (Concerns raised by the patient)- none  (SI/HI/AVH)-  denies

## 2024-06-18 NOTE — Progress Notes (Signed)
   06/18/24 1000  Psych Admission Type (Psych Patients Only)  Admission Status Voluntary  Psychosocial Assessment  Patient Complaints Suspiciousness  Eye Contact Fair  Facial Expression Animated  Affect Appropriate to circumstance  Speech Tangential  Interaction Assertive  Motor Activity Fidgety  Appearance/Hygiene Unremarkable  Behavior Characteristics Appropriate to situation  Mood Suspicious  Thought Process  Coherency Disorganized  Content Preoccupation  Delusions Paranoid  Perception Hallucinations  Hallucination Auditory  Judgment Poor  Confusion None  Danger to Self  Current suicidal ideation? Denies  Danger to Others  Danger to Others None reported or observed   Dar Note:  Patient in the dayroom screaming and yelling on top of his voice.  Stated, the voices are very loud in my head.  Patient pacing the hallway talking to himself.  PRN Haldol  and Benadryl  given orally.  Offered support and encouragement as needed.  Routine safety checks maintained.

## 2024-06-19 MED ORDER — HALOPERIDOL 5 MG PO TABS
5.0000 mg | ORAL_TABLET | Freq: Two times a day (BID) | ORAL | Status: DC
  Administered 2024-06-19 – 2024-06-21 (×4): 5 mg via ORAL
  Filled 2024-06-19 (×4): qty 1

## 2024-06-19 NOTE — Plan of Care (Signed)
   Problem: Education: Goal: Emotional status will improve Outcome: Progressing Goal: Mental status will improve Outcome: Progressing Goal: Verbalization of understanding the information provided will improve Outcome: Progressing

## 2024-06-19 NOTE — BHH Group Notes (Signed)
 Adult Psychoeducational Group Note  Date:  06/19/2024 Time:  7:13 PM  Group Topic/Focus:  Goals Group:   The focus of this group is to help patients establish daily goals to achieve during treatment and discuss how the patient can incorporate goal setting into their daily lives to aide in recovery. Orientation:   The focus of this group is to educate the patient on the purpose and policies of crisis stabilization and provide a format to answer questions about their admission.  The group details unit policies and expectations of patients while admitted.  Participation Level:  Did Not Attend  Participation Quality:    Affect:    Cognitive:    Insight:   Engagement in Group:    Modes of Intervention:    Additional Comments:    Albert [name] 06/19/2024, 7:13 PM

## 2024-06-19 NOTE — Plan of Care (Signed)
   Problem: Education: Goal: Knowledge of [facility] Education information/materials will improve Outcome: Progressing Goal: Emotional status will improve Outcome: Progressing Goal: Mental status will improve Outcome: Progressing Goal: Verbalization of understanding the information provided will improve Outcome: Progressing

## 2024-06-19 NOTE — Group Note (Signed)
 Date:  06/19/2024 Time:  9:14 PM  Group Topic/Focus:  Wrap-Up Group:   The focus of this group is to help patients review their daily goal of treatment and discuss progress on daily workbooks.    Participation Level:  Active  Participation Quality:  Appropriate  Affect:  Appropriate  Cognitive:  Appropriate  Insight: Appropriate  Engagement in Group:  Developing/Improving  Modes of Intervention:  Discussion  Additional Comments:  Pt stated his goal for today was to focus on his treatment plan. Pt stated he accomplished his goal today. Pt stated he talked with his doctor and social worker about his care today. Pt rated his overall day a 7 out of 10. Pt stated he made no calls today. Pt stated he felt better about himself today. Pt stated he was able to attend all meals. Pt stated he took all medications provided today. Pt stated he attend all groups held today. Pt stated his appetite was pretty good today. Pt rated sleep last night was pretty good. Pt stated the goal tonight was to get some rest. Pt stated he had no physical pain tonight. Pt deny visual hallucinations and auditory issues tonight. Pt denies thoughts of harming himself or others. Pt stated he would alert staff if anything changed  Pratik Dalziel 06/19/2024, 9:14 PM

## 2024-06-19 NOTE — Progress Notes (Signed)
 [facility] Inpatient Psychiatry Progress Note  Date: 06/19/24 Patient: Albert [name] MRN: [id number]  Assessment and Plan: Devone Kreps is a 48 y.o. male male with a history of previously diagnosed schizophrenia and multiple psychiatric inpatient admissions who was admitted to [facility] for paranoid delusions and hallucinations with the specific concern that hallucinations and suicidal thoughts would trigger an aneurysm in his brain to rupture.   8/19 - Continues to be grossly psychotic but med compliant. Depakote  level WNL so he has been compliant with meds.   8/20 - Patient had a verbal outburst this morning towards a staff member due to mis attributed AH.   8/21 - Another episode of disorganized/psychotic behaviors last night. Patient reports some improvement in anxious/paranoid thoughts with PRN haloperidol  so will order this.   8/22 - Improved behaviors today, but received three doses of prn haloperidol  yesterday. Plan to schedule haloperidol .   # Paranoid schizophrenia (HCC)  - Unclear if he also has BPAD. Reports also having an autism spectrum disorder and encourages communication with his outpatient psychiatrist regarding his treatment.  - Continue depakote  DR 750 mg BID. Level 75 on 8/19. - Quetiapine  400 mg at bedtime  - Diphenhydramine  25 mg at bedtime for suspected EPS. - Left message with Dr. Reddy's office at [facility].  - Haldol  5 mg BID    Risk Assessment - Patient is overtly psychotic and gravely disabled. He does not appear to be actively suicidal  Discharge Planning Barriers to discharge: Active psychosis Estimated length of stay: 5-7 days Predicted Discharge location: Home    Interval History: Chart reviewed. Patient received three doses of prn haloperidol  yesterday for psychotic behavior. He slept well last night. Today his behavior has been improved but still disorganized and odd. He reported feeling  mildly less anxious today but still worried about how he will fare after discharge. He remains hesitant to increase quetiapine . He denies any clear medication side effects. He denied any suicidal ideation today or excessive concerns over an aneurysm in his head rupturing.       Physical Exam MSK/Neuro - Normal gait and station. Occasional inspiratory nasal tics Mental Status Exam Appearance - Wearing scrubs, appropriate hygiene Attitude - Polite, but hesitant Speech - soft and monotonous, normal prosody Mood - okay Affect - Odd and restricted Thought Process - Tangential to disorganized, illogical Thought Content - Believes that a unit tech was mocking him this morning SI/HI - denies today Perceptions - Ongoing AH a tiny bit, RIS at one point during assessment Judgement/Insight - Impaired Fund of knowledge - WNL Language - No impairments      Lab Results:  Admission on 06/14/2024  Component Date Value Ref Range Status   Valproic Acid  Lvl 06/16/2024 74  50 - 100 ug/mL Final     Vitals: Blood pressure (!) 113/97, pulse 81, temperature 98.6 F (37 C), resp. rate 16, height 5' 10 (1.778 m), weight 104.8 kg, SpO2 96%.    Oliva DELENA Salmon, DO

## 2024-06-19 NOTE — BHH Group Notes (Signed)
 Spiritual care group facilitated by Chaplain Rockie Sofia, BCC and Alan Lesches, Mdiv  Group focused on topic of strength. Group members reflected on what thoughts and feelings emerge when they hear this topic. They then engaged in facilitated dialog around how strength is present in their lives. This dialog focused on representing what strength had been to them in their lives (images and patterns given) and what they saw as helpful in their life now (what they needed / wanted).  Activity drew on narrative framework.  Patient Progress: Albert [name] attended group.  He participated some in group conversation.  At times it was difficult to follow his train of thought, but he was engaged when he was sharing.

## 2024-06-19 NOTE — Progress Notes (Signed)
   06/19/24 0809  Psych Admission Type (Psych Patients Only)  Admission Status Voluntary  Psychosocial Assessment  Patient Complaints Suspiciousness;Anxiety  Eye Contact Fair  Facial Expression Flat  Affect Preoccupied  Speech Soft  Interaction Minimal  Motor Activity Other (Comment) (WDL)  Appearance/Hygiene Unremarkable  Behavior Characteristics Guarded  Mood Suspicious;Preoccupied  Thought Process  Coherency Disorganized  Content Preoccupation  Delusions Paranoid  Perception Hallucinations  Hallucination Auditory  Judgment Poor  Confusion None  Danger to Self  Current suicidal ideation? Denies  Agreement Not to Harm Self Yes  Description of Agreement Verbal  Danger to Others  Danger to Others None reported or observed

## 2024-06-19 NOTE — Progress Notes (Signed)
(  Sleep Hours) - 8.5 (Any PRNs that were needed, meds refused, or side effects to meds)- benadryl  / haldol  (Any disturbances and when (visitation, over night)- none (Concerns raised by the patient)- none (SI/HI/AVH)-  denies

## 2024-06-20 NOTE — Progress Notes (Signed)
 [facility] Inpatient Psychiatry Progress Note  Date: 06/20/24 Patient: Albert [name] MRN: [id number]  Assessment and Plan: Albert [name] is a 48 y.o. male male with a history of previously diagnosed schizophrenia and multiple psychiatric inpatient admissions who was admitted to [facility] for paranoid delusions and hallucinations with the specific concern that hallucinations and suicidal thoughts would trigger an aneurysm in his brain to rupture.   8/19 - Continues to be grossly psychotic but med compliant. Depakote  level WNL so he has been compliant with meds.   8/20 - Patient had a verbal outburst this morning towards a staff member due to mis attributed AH.   8/21 - Another episode of disorganized/psychotic behaviors last night. Patient reports some improvement in anxious/paranoid thoughts with PRN haloperidol  so will order this.   8/22 - Improved behaviors today, but received three doses of prn haloperidol  yesterday. Plan to schedule haloperidol .   8/23 - Further improvement with haloperidol . He may be appropriate for discharge home tomorrow.   # Paranoid schizophrenia (HCC)  - Unclear if he also has BPAD. Reports also having an autism spectrum disorder and encourages communication with his outpatient psychiatrist regarding his treatment.  - Continue depakote  DR 750 mg BID. Level 75 on 8/19. - Quetiapine  400 mg at bedtime  - Diphenhydramine  25 mg at bedtime for suspected EPS. - Left message with Dr. Reddy's office at [facility].  - Haldol  5 mg BID    Risk Assessment - Patient remains psychotic but is no longer endorsing SI. He is considered to be at moderate risk  Discharge Planning Barriers to discharge: Active psychosis Estimated length of stay: 1-2 days Predicted Discharge location: Home    Interval History: Chart reviewed. Patient slept well last night. No prn haloperidol  administered. No behavioral issues reported by  staff. He has been compliant with medications. He did not report any clear EPS since starting scheduled haloperidol  yesterday. He reported feeling well and while he endorsed ongoing AH he reported that it was markedly diminished and he was far less concerned about it and not thinking much about it. He stated that he was thinking about getting back to normality and what he will do when he returns home. He stated that he plans on going back to daily AA meetings, going out for coffee, and is considering doing an IOP program. He denied any SI or concerns about the previously mentioned aneurysm today.      Physical Exam MSK/Neuro - Normal gait and station. Occasional inspiratory nasal tics Mental Status Exam Appearance - Wearing scrubs, appropriate hygiene Attitude - Polite, but hesitant Speech - soft and monotonous, normal prosody Mood - Pretty good Affect - Odd and restricted Thought Process - Generally linear and GD. Appeared mostly logical Thought Content - Still endorses thoughts of the elmo/gonzo universe and voice but states that he is thinking about it less. Less concerned about a rupturing aneurysm today. SI/HI - denies today Perceptions - Reports continuing but decreased AH. Not clearly RIS during assessment Judgement/Insight - poor to fair Fund of knowledge - WNL Language - No impairments      Lab Results:  Admission on 06/14/2024  Component Date Value Ref Range Status   Valproic Acid  Lvl 06/16/2024 74  50 - 100 ug/mL Final     Vitals: Blood pressure 117/77, pulse (!) 48, temperature 97.8 F (36.6 C), resp. rate 18, height 5' 10 (1.778 m), weight 104.8 kg, SpO2 98%.    Oliva DELENA Salmon, DO

## 2024-06-20 NOTE — Plan of Care (Signed)
   Problem: Education: Goal: Mental status will improve Outcome: Progressing   Problem: Activity: Goal: Interest or engagement in activities will improve Outcome: Progressing

## 2024-06-20 NOTE — Group Note (Signed)
 [facility] LCSW Group Therapy Note    Group Date: 06/20/2024 Start Time: 1030 End Time: 1130  Type of Therapy and Topic:  Group Therapy:  Overcoming Obstacles  Participation Level:  [facility] PARTICIPATION LEVEL: Did Not Attend   Hunter JONELLE Lever, LCSWA

## 2024-06-20 NOTE — Plan of Care (Signed)
  Problem: Education: Goal: Mental status will improve Outcome: Progressing   Problem: Health Behavior/Discharge Planning: Goal: Compliance with treatment plan for underlying cause of condition will improve Outcome: Progressing   Problem: Safety: Goal: Periods of time without injury will increase Outcome: Progressing   

## 2024-06-20 NOTE — Group Note (Signed)
 Date:  06/20/2024 Time:  5:00 PM  Group Topic/Focus:  Goals Group:   The focus of this group is to help patients establish daily goals to achieve during treatment and discuss how the patient can incorporate goal setting into their daily lives to aide in recovery. Orientation:   The focus of this group is to educate the patient on the purpose and policies of crisis stabilization and provide a format to answer questions about their admission.  The group details unit policies and expectations of patients while admitted.    Participation Level:  Did Not Attend    Additional Comments:  Did not attend  Albert [name] 06/20/2024, 5:00 PM

## 2024-06-20 NOTE — Group Note (Signed)
 Date:  06/20/2024 Time:  8:56 PM  Group Topic/Focus:  Wrap-Up Group:   The focus of this group is to help patients review their daily goal of treatment and discuss progress on daily workbooks.    Participation Level:  Active  Participation Quality:  Appropriate  Affect:  Appropriate  Cognitive:  Appropriate  Insight: Appropriate  Engagement in Group:  Developing/Improving  Modes of Intervention:  Discussion  Additional Comments:   Pt stated his goal for today was to focus on his treatment plan. Pt stated he accomplished his goal today. Pt stated he talked with his doctor and social worker about his care today. Pt rated his overall day a 8 out of 10. Pt stated he made no calls today. Pt stated he felt better about himself today. Pt stated he was able to attend to attend dinner today. Pt stated he took all medications provided today. Pt stated he attend all groups held today. Pt stated his appetite was pretty good today. Pt rated sleep last night was pretty good. Pt stated the goal tonight was to get some rest. Pt stated he had no physical pain tonight. Pt admitted to dealing with visual hallucinations and auditory issues tonight. Pt nurse was updated on the situation. Pt denies thoughts of harming himself or others. Pt stated he would alert staff if anything changed   Argelio Granier 06/20/2024, 8:56 PM

## 2024-06-20 NOTE — Group Note (Unsigned)
 Date:  06/25/2024 Time:  3:58 PM  Group Topic/Focus:  Goals Group:   The focus of this group is to help patients establish daily goals to achieve during treatment and discuss how the patient can incorporate goal setting into their daily lives to aide in recovery. Orientation:   The focus of this group is to educate the patient on the purpose and policies of crisis stabilization and provide a format to answer questions about their admission.  The group details unit policies and expectations of patients while admitted.    Participation Level:  Did Not Attend  Additional Comments:  Did not attend.  Logan LITTIE Molly 06/25/2024, 3:58 PM

## 2024-06-20 NOTE — Group Note (Signed)
 LCSW Group Therapy Note  @TD @   10:00am-11:00am  Type of Therapy and Topic:  Group Therapy: Gratitude  Participation Level:  None   Description of Group:   In this group, patients shared and discussed the importance of acknowledging the elements in their lives for which they are grateful and how this can positively impact their mood.  The group discussed how bringing the positive elements of their lives to the forefront of their minds can help with recovery from any illness, physical or mental.  An exercise was done as a group in which a list was made of gratitude items in order to encourage participants to consider other potential positives in their lives.  Therapeutic Goals: Patients will discuss quotes about gratitude and explore how a change of attitude can make life more joyful. Patients will identify one or more items for which they are grateful in each of 6 categories:  people, experiences, things, places, skills, and other. Patients will discuss how it is possible to seek out gratitude in even bad situations. Patients will explore how the lack of gratitude can bring them down.   Summary of Patient Progress:  NA Therapeutic Modalities:   Solution-Focused Therapy Activity  Blake Vetrano O Jeannene Tschetter, LCSWA 06/20/2024  1:16 PM

## 2024-06-20 NOTE — Plan of Care (Signed)

## 2024-06-20 NOTE — Progress Notes (Addendum)
(  Sleep Hours) - 9.75 (Any PRNs that were needed, meds refused, or side effects to meds)- None (Any disturbances and when (visitation, over night)-None (Concerns raised by the patient)- None (SI/HI/AVH)- Denied

## 2024-06-20 NOTE — Progress Notes (Signed)
 Patient presents: Flat affect with very brief eye contact. Patient observed in dayroom area and reading his book. Patient states he is trying to do some self work. Patient is cooperative and med compliant.    SI/HI/AVH: Denies   Plan: Denies   Groups attended: 0/3   Appetite: Adequate. Attended meals.   Sleep: No sleep disturbances reported.   PRNS: N/A   Disturbances: No disturbances. Patient remains cooperative in milieu.    Questions/concerns: No further questions or concerns.   VS: BP 139/84 (BP Location: Right Arm)   Pulse 89   Temp 97.8 F (36.6 C)   Resp 18   Ht 5' 10 (1.778 m)   Wt 104.8 kg   SpO2 98%   BMI 33.15 kg/m

## 2024-06-21 DIAGNOSIS — F200 Paranoid schizophrenia: Principal | ICD-10-CM

## 2024-06-21 MED ORDER — HALOPERIDOL 5 MG PO TABS: 5.0000 mg | ORAL_TABLET | Freq: Every evening | ORAL | 0 refills | Status: AC

## 2024-06-21 NOTE — Progress Notes (Signed)
 Patient discharged off unit by peer. Patient belongings reviewed and acknowledged by patient. AVS and Transition Record reviewed and acknowledged by patient. Safety plan completed by patient and copy provided. Patient denies SI/HI/AVH. No observed or reported side effects to medication. No observed or reported agitation, aggression, or other acute emotional distress. No observed or reported physical abnormalities or concerns. Patient transportation from facility verified and observed.

## 2024-06-21 NOTE — Progress Notes (Signed)
  [facility] Adult Case Management Discharge Plan :  Will you be returning to the same living situation after discharge:  Yes,  patient will return home.  At discharge, do you have transportation home?: No., Safe Transport to assist to [facility] ER where patient's scooter is parked.  Do you have the ability to pay for your medications: Yes,  patient has insurance.  Release of information consent forms completed and in the chart;  Patient's signature needed at discharge.  Patient to Follow up at:  Follow-up Information     Center, [facility]. Schedule an appointment as soon as possible for a visit.   Specialty: Behavioral Health Why: You have an appointment for medication management services on  .  At this time, please schedule an appointment for therapy services as well, at the recommendation of your medication management provider. Contact information: [address] [place] [address] [phone]                 Next level of care provider has access to [employer] Link:no  Safety Planning and Suicide Prevention discussed: Yes,  SPE completed and brochure provided to patient as patient declined consent to notify family/significant other.    Has patient been referred to the Quitline?: Patient refused referral for treatment  Patient has been referred for addiction treatment: Patient refused referral for treatment.  Hunter JONELLE Lever, LCSWA 06/21/2024, 3:36 PM

## 2024-06-21 NOTE — Group Note (Unsigned)
 Date:  06/21/2024 Time:  10:41 AM  Group Topic/Focus:  Goals Group:   The focus of this group is to help patients establish daily goals to achieve during treatment and discuss how the patient can incorporate goal setting into their daily lives to aide in recovery. Orientation:   The focus of this group is to educate the patient on the purpose and policies of crisis stabilization and provide a format to answer questions about their admission.  The group details unit policies and expectations of patients while admitted.     Participation Level:  {BHH PARTICIPATION OZCZO:77735}  Participation Quality:  {BHH PARTICIPATION QUALITY:22265}  Affect:  {BHH AFFECT:22266}  Cognitive:  {BHH COGNITIVE:22267}  Insight: {BHH Insight2:20797}  Engagement in Group:  {BHH ENGAGEMENT IN HMNLE:77731}  Modes of Intervention:  {BHH MODES OF INTERVENTION:22269}  Additional Comments:  ***  Albert [name] 06/21/2024, 10:41 AM

## 2024-06-21 NOTE — Progress Notes (Signed)
   06/20/24 2300  Psych Admission Type (Psych Patients Only)  Admission Status Voluntary  Psychosocial Assessment  Patient Complaints Irritability  Eye Contact Brief  Facial Expression Flat  Affect Irritable  Speech Soft  Interaction Minimal  Motor Activity Other (Comment) (WDL)  Appearance/Hygiene Unremarkable  Behavior Characteristics Cooperative  Mood Pleasant  Aggressive Behavior  Effect No apparent injury  Thought Process  Coherency Circumstantial  Content WDL  Delusions None reported or observed  Perception WDL  Hallucination None reported or observed  Judgment Poor  Confusion None  Danger to Self  Current suicidal ideation? Denies

## 2024-06-21 NOTE — Group Note (Signed)
 Date:  06/21/2024 Time:  10:50 AM  Group Topic/Focus:  Goals Group:   The focus of this group is to help patients establish daily goals to achieve during treatment and discuss how the patient can incorporate goal setting into their daily lives to aide in recovery. Orientation:   The focus of this group is to educate the patient on the purpose and policies of crisis stabilization and provide a format to answer questions about their admission.  The group details unit policies and expectations of patients while admitted.    Participation Level:  Did Not Attend   Additional Comments:  Pt was asked to come to group. He said he didn't want to come this morning.  Ferne Ellingwood M Doria Fern 06/21/2024, 10:50 AM

## 2024-06-21 NOTE — Plan of Care (Signed)

## 2024-06-21 NOTE — Progress Notes (Signed)
 Pt discharged home alert and oriented x 4, calm and cooperative. All belongings returned to pt. Discharge instructions reviewed with pt and pt verbalized understanding of instructions.

## 2024-06-21 NOTE — BHH Suicide Risk Assessment (Signed)
 [facility] Discharge Suicide Risk Assessment   Principal Problem: Paranoid schizophrenia [facility]) Discharge Diagnoses: Principal Problem:   Paranoid schizophrenia (HCC)   Total Time spent with patient: 45 minutes  Demographic Factors:  Male, Low socioeconomic status, Living alone, and Unemployed  Loss Factors: NA  Historical Factors: History of suicidal ideation  Risk Reduction Factors:   Positive therapeutic relationship  Continued Clinical Symptoms:  Psychotic disorder  Cognitive Features That Contribute To Risk:  None    Suicide Risk:  Mild:  Suicidal ideation of limited frequency, intensity, duration, and specificity.  There are no identifiable plans, no associated intent, mild dysphoria and related symptoms, good self-control (both objective and subjective assessment), few other risk factors, and identifiable protective factors, including available and accessible social support.   Follow-up Information     Center, [facility]. Schedule an appointment as soon as possible for a visit.   Specialty: Behavioral Health Why: You have an appointment for medication management services on  .  At this time, please schedule an appointment for therapy services as well, at the recommendation of your medication management provider. Contact information: [address] [place] [address] [phone]                 Plan Of Care/Follow-up recommendations: See DC summary   Albert DELENA Salmon, DO 06/21/2024, 2:55 PM

## 2024-06-21 NOTE — BHH Suicide Risk Assessment (Signed)
 BHH INPATIENT:  Family/Significant Other Suicide Prevention Education  Suicide Prevention Education:  SPE completed with patient, as patient refused to consent to family contact. SPI pamphlet provided to patient and patient was encouraged to share information with support network, ask questions, and talk about any concerns relating to SPE. Pt denies access to guns/firearms and verbalized understanding of information provided. Mobile Crisis information also provided to patient.    Physician notified.  Albert [name] 06/21/2024, 3:31 PM
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
## 2024-06-21 NOTE — Progress Notes (Signed)
(  Sleep Hours) -8.5 (Any PRNs that were needed, meds refused, or side effects to meds)- Haldol  and benadryl   (Any disturbances and when (visitation, over night)- (Concerns raised by the patient)- Pt reported agitation. Pt asked Clinical research associate to [facility] [employer].. Per H&P pt had an auditory hallucination that told him about [place]mo-gonzaf  universe.  (SI/HI/AVH)-endorses AVH

## 2024-06-22 NOTE — Care Management Note (Signed)
 This patient was discharged over the weekend and his follow up appointments (with date and time) were not added to follow up.  This is because social work and care management were not told (by Friday progression) that he would be discharging over the weekend, and the person who arranged the appointments (not sure with which provider) did not add a date and time in the follow section for the patient, and so it may flag an audit.  Please make sure to let social work know if a discharge is at all likely over the weekend, because if I do not know, and they discharge, it is highly likely that weekend staff will not make sure the follow up is complete and thorough with a date and time.  Thank you.
